# Patient Record
Sex: Female | Born: 1960 | Race: White | Hispanic: No | Marital: Married | Smoking: Former smoker
Health system: Southern US, Community
[De-identification: ages and names within clinical notes are randomized; demographics above are authoritative.]

## PROBLEM LIST (undated history)

## (undated) DIAGNOSIS — E119 Type 2 diabetes mellitus without complications: Secondary | ICD-10-CM

## (undated) DIAGNOSIS — Z950 Presence of cardiac pacemaker: Secondary | ICD-10-CM

## (undated) DIAGNOSIS — M797 Fibromyalgia: Secondary | ICD-10-CM

## (undated) DIAGNOSIS — M545 Low back pain, unspecified: Secondary | ICD-10-CM

## (undated) DIAGNOSIS — Z8489 Family history of other specified conditions: Secondary | ICD-10-CM

## (undated) DIAGNOSIS — F32A Depression, unspecified: Secondary | ICD-10-CM

## (undated) DIAGNOSIS — G8929 Other chronic pain: Secondary | ICD-10-CM

## (undated) DIAGNOSIS — I639 Cerebral infarction, unspecified: Secondary | ICD-10-CM

## (undated) DIAGNOSIS — I10 Essential (primary) hypertension: Secondary | ICD-10-CM

## (undated) DIAGNOSIS — E7800 Pure hypercholesterolemia, unspecified: Secondary | ICD-10-CM

## (undated) DIAGNOSIS — R569 Unspecified convulsions: Secondary | ICD-10-CM

## (undated) DIAGNOSIS — J45909 Unspecified asthma, uncomplicated: Secondary | ICD-10-CM

## (undated) DIAGNOSIS — F329 Major depressive disorder, single episode, unspecified: Secondary | ICD-10-CM

## (undated) DIAGNOSIS — H269 Unspecified cataract: Secondary | ICD-10-CM

## (undated) HISTORY — PX: TONSILLECTOMY: SUR1361

## (undated) HISTORY — DX: Depression, unspecified: F32.A

## (undated) HISTORY — DX: Pure hypercholesterolemia, unspecified: E78.00

## (undated) HISTORY — DX: Major depressive disorder, single episode, unspecified: F32.9

## (undated) HISTORY — PX: NECK SURGERY: SHX720

## (undated) HISTORY — DX: Type 2 diabetes mellitus without complications: E11.9

## (undated) HISTORY — DX: Unspecified cataract: H26.9

## (undated) HISTORY — PX: EYE SURGERY: SHX253

## (undated) HISTORY — PX: CHOLECYSTECTOMY: SHX55

## (undated) HISTORY — DX: Essential (primary) hypertension: I10

## (undated) HISTORY — DX: Fibromyalgia: M79.7

## (undated) HISTORY — PX: BACK SURGERY: SHX140

## (undated) HISTORY — DX: Unspecified convulsions: R56.9

---

## 1998-06-13 ENCOUNTER — Other Ambulatory Visit: Admission: RE | Admit: 1998-06-13 | Discharge: 1998-06-13 | Payer: Self-pay | Admitting: Obstetrics and Gynecology

## 1999-06-20 ENCOUNTER — Other Ambulatory Visit: Admission: RE | Admit: 1999-06-20 | Discharge: 1999-06-20 | Payer: Self-pay | Admitting: Obstetrics and Gynecology

## 1999-07-11 ENCOUNTER — Encounter: Admission: RE | Admit: 1999-07-11 | Discharge: 1999-07-11 | Payer: Self-pay | Admitting: Obstetrics and Gynecology

## 1999-07-11 ENCOUNTER — Encounter: Payer: Self-pay | Admitting: Obstetrics and Gynecology

## 2001-04-01 ENCOUNTER — Other Ambulatory Visit: Admission: RE | Admit: 2001-04-01 | Discharge: 2001-04-01 | Payer: Self-pay | Admitting: Obstetrics and Gynecology

## 2002-02-17 ENCOUNTER — Encounter: Payer: Self-pay | Admitting: Pulmonary Disease

## 2002-02-17 ENCOUNTER — Ambulatory Visit (HOSPITAL_COMMUNITY): Admission: RE | Admit: 2002-02-17 | Discharge: 2002-02-17 | Payer: Self-pay | Admitting: Pulmonary Disease

## 2002-06-28 ENCOUNTER — Other Ambulatory Visit: Admission: RE | Admit: 2002-06-28 | Discharge: 2002-06-28 | Payer: Self-pay | Admitting: Obstetrics and Gynecology

## 2002-11-16 ENCOUNTER — Encounter: Admission: RE | Admit: 2002-11-16 | Discharge: 2003-02-14 | Payer: Self-pay | Admitting: Pulmonary Disease

## 2004-02-24 ENCOUNTER — Inpatient Hospital Stay (HOSPITAL_COMMUNITY): Admission: EM | Admit: 2004-02-24 | Discharge: 2004-02-25 | Payer: Self-pay | Admitting: Emergency Medicine

## 2004-11-07 ENCOUNTER — Ambulatory Visit: Payer: Self-pay | Admitting: Pulmonary Disease

## 2004-11-07 ENCOUNTER — Other Ambulatory Visit: Admission: RE | Admit: 2004-11-07 | Discharge: 2004-11-07 | Payer: Self-pay | Admitting: Obstetrics and Gynecology

## 2004-11-13 ENCOUNTER — Ambulatory Visit: Payer: Self-pay | Admitting: Pulmonary Disease

## 2004-11-20 ENCOUNTER — Ambulatory Visit: Payer: Self-pay | Admitting: Pulmonary Disease

## 2005-03-07 ENCOUNTER — Ambulatory Visit: Payer: Self-pay | Admitting: Pulmonary Disease

## 2005-07-04 ENCOUNTER — Ambulatory Visit: Payer: Self-pay | Admitting: Pulmonary Disease

## 2005-10-29 ENCOUNTER — Ambulatory Visit: Payer: Self-pay | Admitting: Psychology

## 2005-11-06 ENCOUNTER — Ambulatory Visit: Payer: Self-pay | Admitting: Psychology

## 2005-12-02 ENCOUNTER — Other Ambulatory Visit: Admission: RE | Admit: 2005-12-02 | Discharge: 2005-12-02 | Payer: Self-pay | Admitting: Obstetrics and Gynecology

## 2005-12-07 ENCOUNTER — Ambulatory Visit: Payer: Self-pay | Admitting: Psychology

## 2005-12-18 ENCOUNTER — Ambulatory Visit: Payer: Self-pay | Admitting: Psychology

## 2005-12-31 ENCOUNTER — Ambulatory Visit: Payer: Self-pay | Admitting: Psychology

## 2006-01-02 ENCOUNTER — Ambulatory Visit: Payer: Self-pay | Admitting: Pulmonary Disease

## 2006-01-15 ENCOUNTER — Ambulatory Visit: Payer: Self-pay | Admitting: Psychology

## 2006-04-28 ENCOUNTER — Ambulatory Visit: Payer: Self-pay | Admitting: Pulmonary Disease

## 2006-07-28 ENCOUNTER — Ambulatory Visit: Payer: Self-pay | Admitting: Pulmonary Disease

## 2011-10-13 ENCOUNTER — Ambulatory Visit (INDEPENDENT_AMBULATORY_CARE_PROVIDER_SITE_OTHER): Payer: 59 | Admitting: Family Medicine

## 2011-10-13 DIAGNOSIS — F329 Major depressive disorder, single episode, unspecified: Secondary | ICD-10-CM | POA: Insufficient documentation

## 2011-10-13 DIAGNOSIS — F32A Depression, unspecified: Secondary | ICD-10-CM | POA: Insufficient documentation

## 2011-10-13 DIAGNOSIS — K219 Gastro-esophageal reflux disease without esophagitis: Secondary | ICD-10-CM | POA: Insufficient documentation

## 2011-10-13 DIAGNOSIS — E119 Type 2 diabetes mellitus without complications: Secondary | ICD-10-CM

## 2011-10-13 DIAGNOSIS — R259 Unspecified abnormal involuntary movements: Secondary | ICD-10-CM

## 2011-10-13 DIAGNOSIS — R251 Tremor, unspecified: Secondary | ICD-10-CM

## 2011-10-13 DIAGNOSIS — R635 Abnormal weight gain: Secondary | ICD-10-CM

## 2011-10-13 DIAGNOSIS — L853 Xerosis cutis: Secondary | ICD-10-CM

## 2011-10-13 DIAGNOSIS — R6889 Other general symptoms and signs: Secondary | ICD-10-CM

## 2011-10-13 LAB — COMPREHENSIVE METABOLIC PANEL
ALT: 14 U/L (ref 0–35)
AST: 13 U/L (ref 0–37)
Albumin: 4.8 g/dL (ref 3.5–5.2)
Calcium: 9.7 mg/dL (ref 8.4–10.5)
Chloride: 107 mEq/L (ref 96–112)
Potassium: 4 mEq/L (ref 3.5–5.3)

## 2011-10-13 LAB — LIPID PANEL
LDL Cholesterol: 70 mg/dL (ref 0–99)
VLDL: 31 mg/dL (ref 0–40)

## 2011-10-13 MED ORDER — ATORVASTATIN CALCIUM 20 MG PO TABS: 20.0000 mg | ORAL_TABLET | Freq: Every day | ORAL | Status: DC

## 2011-10-13 MED ORDER — LOSARTAN POTASSIUM 50 MG PO TABS: 50.0000 mg | ORAL_TABLET | Freq: Every day | ORAL | Status: DC

## 2011-10-13 MED ORDER — PRIMIDONE 250 MG PO TABS: 250.0000 mg | ORAL_TABLET | Freq: Every day | ORAL | Status: DC

## 2011-10-13 MED ORDER — METFORMIN HCL 500 MG PO TABS: ORAL_TABLET | ORAL | Status: DC

## 2011-10-13 NOTE — Progress Notes (Signed)
Subjective:    Patient ID: Brittany [name], female    DOB: 1961/05/22, 51 y.o.   MRN: [id number]  HPI Brittany [name] is a 51 y.o. female  Dm2: takning 1/2 metformin qd.  No home cbg's  No missed doses.  Tolerating meds.  Tremor: Since childhood.  No recent changes - notes tremor if stressed, o/w controlled on meds.  Would like to have rx here instead of Dr. Glade Stanford office.  Has ov scheduled at Dr . Vela Prose' s office in 9 days.  Cold natured x 1 week, and wt gain since last year.  No recent tsh.  No recent physical.        Review of Systems  Constitutional: Positive for chills and unexpected weight change. Negative for fever and appetite change.       Cold natured past week.  Worse in legs.  Weight gain since last summer 146-165  HENT: Negative for neck pain and neck stiffness.   Eyes: Negative for visual disturbance.  Respiratory: Negative for cough and shortness of breath.   Cardiovascular: Negative for chest pain and leg swelling.  Gastrointestinal: Negative for abdominal pain and diarrhea.       Rare diarrhea - s/p cholecystectomy.  Not regular occurrence.  Musculoskeletal: Negative for myalgias and arthralgias.  Skin: Negative for rash.       More dry skin, dry hair past 2 months.  Neurological: Positive for tremors. Negative for dizziness, weakness, light-headedness and headaches.       Tremor controlled on meds - sometimes notices with stress.  Chronic tremor.  Psychiatric/Behavioral: Negative for dysphoric mood.       Objective:   Physical Exam  Constitutional: She is oriented to person, place, and time. She appears well-developed and well-nourished. No distress.  HENT:  Head: Normocephalic and atraumatic.  Right Ear: External ear normal.  Left Ear: External ear normal.  Mouth/Throat: Oropharynx is clear and moist. No oropharyngeal exudate.  Eyes: Conjunctivae and EOM are normal. Pupils are equal, round, and reactive to light.  Neck: Normal range of motion. Neck  supple. No tracheal deviation present. No mass and no thyromegaly present.    Cardiovascular: Normal rate, regular rhythm, normal heart sounds, intact distal pulses and normal pulses.   No extrasystoles are present. PMI is not displaced.   No murmur heard. Pulses:      Dorsalis pedis pulses are 2+ on the right side, and 2+ on the left side.       Toes warm, nl cap refill.  Pulmonary/Chest: Effort normal and breath sounds normal.  Abdominal: Soft. Bowel sounds are normal. She exhibits no distension. There is no rebound.  Musculoskeletal: Normal range of motion. She exhibits no edema.  Neurological: She is alert and oriented to person, place, and time. She has normal strength. No cranial nerve deficit or sensory deficit. She displays a negative Romberg sign. Coordination normal.       Faint tremor at rest.  negative pronator drift.  Skin: Skin is warm and dry. She is not diaphoretic.  Psychiatric: She has a normal mood and affect. Her behavior is normal.    Results for orders placed in visit on 10/13/11  GLUCOSE, POCT (MANUAL RESULT ENTRY)      Component Value Range   POC Glucose 125    POCT GLYCOSYLATED HEMOGLOBIN (HGB A1C)      Component Value Range   Hemoglobin A1C 6.6           Assessment & Plan:  Brittany Ryant  [name] is a 50 y.o. female 1. DM (diabetes mellitus)  Lipid panel, Comprehensive metabolic panel, POCT Glucose (CBG), POCT HgB A1C  2. Tremor    3. Cold feeling  TSH  4. Dry skin  TSH  5. Weight gain  TSH   Cont same meds- dm controlled. Plan on refilling primidone here for next few months, but pt to schedule CPE with Dr. Alwyn Ren, and can discuss then if we are able to follow here for this med.  Should keep a follow up appt with Dr. Vela Prose in the meantime.  Marland Kitchen

## 2011-10-13 NOTE — Patient Instructions (Signed)
Continue same meds.  Start baby aspirin (81mg ) each day for diabetes.. Schedule physical for follow up with Dr. Alwyn Ren, and keep a follow up appointment with Dr. Vela Prose.

## 2012-05-01 ENCOUNTER — Other Ambulatory Visit: Payer: Self-pay | Admitting: Family Medicine

## 2012-05-10 ENCOUNTER — Ambulatory Visit (INDEPENDENT_AMBULATORY_CARE_PROVIDER_SITE_OTHER): Payer: 59 | Admitting: Emergency Medicine

## 2012-05-10 VITALS — BP 124/80 | HR 82 | Temp 98.6°F | Resp 16 | Ht 64.0 in | Wt 180.0 lb

## 2012-05-10 DIAGNOSIS — E785 Hyperlipidemia, unspecified: Secondary | ICD-10-CM

## 2012-05-10 DIAGNOSIS — E119 Type 2 diabetes mellitus without complications: Secondary | ICD-10-CM

## 2012-05-10 DIAGNOSIS — I10 Essential (primary) hypertension: Secondary | ICD-10-CM

## 2012-05-10 LAB — COMPREHENSIVE METABOLIC PANEL
ALT: 18 U/L (ref 0–35)
AST: 15 U/L (ref 0–37)
Albumin: 4.3 g/dL (ref 3.5–5.2)
CO2: 25 mEq/L (ref 19–32)
Calcium: 9.1 mg/dL (ref 8.4–10.5)
Chloride: 105 mEq/L (ref 96–112)
Potassium: 4 mEq/L (ref 3.5–5.3)

## 2012-05-10 LAB — POCT CBC
MCH, POC: 29.7 pg (ref 27–31.2)
MID (cbc): 0.4 (ref 0–0.9)
MPV: 8.4 fL (ref 0–99.8)
POC MID %: 7 %M (ref 0–12)
Platelet Count, POC: 277 10*3/uL (ref 142–424)
RBC: 4.98 M/uL (ref 4.04–5.48)
RDW, POC: 12.6 %
WBC: 6.3 10*3/uL (ref 4.6–10.2)

## 2012-05-10 LAB — POCT GLYCOSYLATED HEMOGLOBIN (HGB A1C): Hemoglobin A1C: 9.7

## 2012-05-10 LAB — LIPID PANEL: Cholesterol: 153 mg/dL (ref 0–200)

## 2012-05-10 LAB — GLUCOSE, POCT (MANUAL RESULT ENTRY): POC Glucose: 254 mg/dl — AB (ref 70–99)

## 2012-05-10 LAB — MICROALBUMIN, URINE: Microalb, Ur: 1.49 mg/dL (ref 0.00–1.89)

## 2012-05-10 MED ORDER — SERTRALINE HCL 100 MG PO TABS: 100.0000 mg | ORAL_TABLET | Freq: Every day | ORAL | Status: DC

## 2012-05-10 MED ORDER — METFORMIN HCL 500 MG PO TABS: 500.0000 mg | ORAL_TABLET | Freq: Two times a day (BID) | ORAL | Status: DC

## 2012-05-10 MED ORDER — LOSARTAN POTASSIUM 50 MG PO TABS: 50.0000 mg | ORAL_TABLET | Freq: Every day | ORAL | Status: DC

## 2012-05-10 MED ORDER — ATORVASTATIN CALCIUM 20 MG PO TABS: 20.0000 mg | ORAL_TABLET | Freq: Every day | ORAL | Status: DC

## 2012-05-10 NOTE — Progress Notes (Signed)
  Subjective:    Patient ID: Brittany [name], female    DOB: [birth year], 51 y.o.   MRN: [id number]  HPI patient here to get her blood work so she her medications refilled. History of metabolic syndrome with high blood pressure high cholesterol diabetes and obesity. She also has a history of  essential tremor and she is on primidone for that. She states she feels well. She continues to smoke. She is not had any chest pain shortness of breath bowel problems .    Review of Systems     Objective:   Physical Exam  Constitutional: She appears well-developed and well-nourished.  HENT:  Head: Normocephalic.  Eyes: Pupils are equal, round, and reactive to light.  Neck: Normal range of motion. No thyromegaly present.  Cardiovascular: Normal rate and regular rhythm.   Pulmonary/Chest: Effort normal and breath sounds normal.  Abdominal: Soft. She exhibits no distension. There is no tenderness.  Neurological:       She does have a fine tremor. Sensation in feet is normal. Dorsalis pedis posterior tibial pulses are normal    Results for orders placed in visit on 05/10/12  POCT CBC      Component Value Range   WBC 6.3  4.6 - 10.2 K/uL   Lymph, poc 2.0  0.6 - 3.4   POC LYMPH PERCENT 32.4  10 - 50 %L   MID (cbc) 0.4  0 - 0.9   POC MID % 7.0  0 - 12 %M   POC Granulocyte 3.8  2 - 6.9   Granulocyte percent 60.6  37 - 80 %G   RBC 4.98  4.04 - 5.48 M/uL   Hemoglobin 14.8  12.2 - 16.2 g/dL   HCT, POC 40.9  81.1 - 47.9 %   MCV 95.0  80 - 97 fL   MCH, POC 29.7  27 - 31.2 pg   MCHC 31.3 (*) 31.8 - 35.4 g/dL   RDW, POC 91.4     Platelet Count, POC 277  142 - 424 K/uL   MPV 8.4  0 - 99.8 fL  GLUCOSE, POCT (MANUAL RESULT ENTRY)      Component Value Range   POC Glucose 254 (*) 70 - 99 mg/dl  POCT GLYCOSYLATED HEMOGLOBIN (HGB A1C)      Component Value Range   Hemoglobin A1C 9.7          Assessment & Plan:  His hemoglobin A1c is up 3 points. I reinforced her need to continue to diet exercise  work on weight loss. She was again encouraged to stop smoking. I told her she was a high risk for heart disease and it was imperative that she address these issues

## 2012-10-26 ENCOUNTER — Ambulatory Visit (INDEPENDENT_AMBULATORY_CARE_PROVIDER_SITE_OTHER): Payer: 59 | Admitting: Family Medicine

## 2012-10-26 VITALS — BP 122/80 | HR 86 | Temp 98.8°F | Resp 16 | Ht 63.0 in | Wt 172.0 lb

## 2012-10-26 DIAGNOSIS — IMO0001 Reserved for inherently not codable concepts without codable children: Secondary | ICD-10-CM

## 2012-10-26 DIAGNOSIS — E785 Hyperlipidemia, unspecified: Secondary | ICD-10-CM | POA: Insufficient documentation

## 2012-10-26 DIAGNOSIS — E119 Type 2 diabetes mellitus without complications: Secondary | ICD-10-CM

## 2012-10-26 DIAGNOSIS — Z79899 Other long term (current) drug therapy: Secondary | ICD-10-CM | POA: Insufficient documentation

## 2012-10-26 DIAGNOSIS — I10 Essential (primary) hypertension: Secondary | ICD-10-CM | POA: Insufficient documentation

## 2012-10-26 DIAGNOSIS — R251 Tremor, unspecified: Secondary | ICD-10-CM

## 2012-10-26 LAB — COMPREHENSIVE METABOLIC PANEL
ALT: 28 U/L (ref 0–35)
CO2: 27 mEq/L (ref 19–32)
Calcium: 9.8 mg/dL (ref 8.4–10.5)
Chloride: 107 mEq/L (ref 96–112)
Creat: 0.58 mg/dL (ref 0.50–1.10)
Glucose, Bld: 186 mg/dL — ABNORMAL HIGH (ref 70–99)
Total Protein: 7.1 g/dL (ref 6.0–8.3)

## 2012-10-26 LAB — POCT GLYCOSYLATED HEMOGLOBIN (HGB A1C): Hemoglobin A1C: 9.4

## 2012-10-26 MED ORDER — METFORMIN HCL 1000 MG PO TABS: 1000.0000 mg | ORAL_TABLET | Freq: Two times a day (BID) | ORAL | Status: DC

## 2012-10-26 NOTE — Progress Notes (Signed)
Subjective:    Patient ID: Brittany [name], female    DOB: Aug 01, 1961, 52 y.o.   MRN: [id number]  HPI  At last visit, 5 mos ago her hgba1c had increased 3 points to 9.7.  Not checking sugars at home as when she previously did no one would ever review them.  Not made any changes as she likes starches/carbs to much and it is to expensive to eat healthy. Taking metformin 1 tab at night only before bed.  Gets jerking and presyncope when cbgs to low, gets sleepy when to high - she walks occ for exercise if cbgs are to high.  Doesn't have either of these episodes recently though did have lows previously when she tried to take metformin bid (years ago.)   Goes to gyn - Dr. Lyman Bishop - yrly for mammogram and pelvic.   Past Medical History  Diagnosis Date  . Diabetes mellitus without complication    Current Outpatient Prescriptions on File Prior to Visit  Medication Sig Dispense Refill  . atorvastatin (LIPITOR) 20 MG tablet Take 1 tablet (20 mg total) by mouth daily.  30 tablet  11  . losartan (COZAAR) 50 MG tablet Take 1 tablet (50 mg total) by mouth daily. NEEDS OFFICE VISIT/LABS  30 tablet  11  . metFORMIN (GLUCOPHAGE) 500 MG tablet Take 1 tablet (500 mg total) by mouth 2 (two) times daily with a meal. 1/2 tablet by mouth qd  60 tablet  11  . primidone (MYSOLINE) 250 MG tablet Take 1 tablet (250 mg total) by mouth at bedtime.  90 tablet  1  . sertraline (ZOLOFT) 100 MG tablet Take 1 tablet (100 mg total) by mouth daily.  30 tablet  11   No current facility-administered medications on file prior to visit.   No Known Allergies   Review of Systems  Constitutional: Positive for fatigue. Negative for fever, chills, diaphoresis and appetite change.  Eyes: Negative for visual disturbance.  Respiratory: Negative for cough and shortness of breath.   Cardiovascular: Negative for chest pain, palpitations and leg swelling.  Genitourinary: Negative for decreased urine volume.  Neurological: Positive for  tremors. Negative for syncope and headaches.  Hematological: Does not bruise/bleed easily.      BP 122/80  Pulse 86  Temp(Src) 98.8 F (37.1 C) (Oral)  Resp 16  Ht 5\' 3"  (1.6 m)  Wt 172 lb (78.019 kg)  BMI 30.48 kg/m2  SpO2 98% Objective:   Physical Exam  Constitutional: She is oriented to person, place, and time. She appears well-developed and well-nourished. No distress.  HENT:  Head: Normocephalic and atraumatic.  Right Ear: External ear normal.  Left Ear: External ear normal.  Eyes: Conjunctivae are normal. No scleral icterus.  Neck: Normal range of motion. Neck supple. No thyromegaly present.  Cardiovascular: Normal rate, regular rhythm, normal heart sounds and intact distal pulses.   Pulmonary/Chest: Effort normal and breath sounds normal. No respiratory distress.  Musculoskeletal: She exhibits no edema.  Lymphadenopathy:    She has no cervical adenopathy.  Neurological: She is alert and oriented to person, place, and time.  Skin: Skin is warm and dry. She is not diaphoretic. No erythema.  Psychiatric: Her speech is normal and behavior is normal. Her affect is blunt. She exhibits a depressed mood.          Results for orders placed in visit on 10/26/12  POCT GLYCOSYLATED HEMOGLOBIN (HGB A1C)      Result Value Range   Hemoglobin A1C 9.4  Assessment & Plan:  DM (diabetes mellitus) - Plan: POCT glycosylated hemoglobin (Hb A1C), Comprehensive metabolic panel, metFORMIN (GLUCOPHAGE) 1000 MG tablet, DISCONTINUED: metFORMIN (GLUCOPHAGE) 1000 MG tablet.  Since the past yr pt's hgba1c has increased 3 pts from 6's to 9's.  Has not improved with trial of tlc in past 5 mos so increase metformin from 500 qd to 1000 bid.  Start asa 81 qd.  Optho eval?? Discuss at f/u.  Cont arb - unsure if pt has had a trial of acei - might need to review chart to complete PA if insurance needs.  Pt does not have meter at home - needs new rx for meter, strips, lancets though I forgot to give  this to her in the office.  Tremor - stable essential tremor, cont primidone  Encounter for long-term (current) use of other medications  Hyperlipidemia LDL goal < 100 - check flp at f/u  Essential hypertension, benign  Meds ordered this encounter  Medications                 . metFORMIN (GLUCOPHAGE) 1000 MG tablet    Sig: Take 1 tablet (1,000 mg total) by mouth 2 (two) times daily with a meal.    Dispense:  180 tablet    Refill:  1

## 2012-10-26 NOTE — Patient Instructions (Signed)
Diabetes, Eating Away From Home Sometimes, you might eat in a restaurant or have meals that are prepared by someone else. You can enjoy eating out. However, the portions in restaurants may be much larger than needed. Listed below are some ideas to help you choose foods that will keep your blood glucose (sugar) in better control.  TIPS FOR EATING OUT  Know your meal plan and how many carbohydrate servings you should have at each meal. You may wish to carry a copy of your meal plan in your purse or wallet. Learn the foods included in each food group.  Make a list of restaurants near you that offer healthy choices. Take a copy of the carry-out menus to see what they offer. Then, you can plan what you will order ahead of time.  Become familiar with serving sizes by practicing them at home using measuring cups and spoons. Once you learn to recognize portion sizes, you will be able to correctly estimate the amount of total carbohydrate you are allowed to eat at the restaurant. Ask for a takeout box if the portion is more than you should have. When your food comes, leave the amount you should have on the plate, and put the rest in the takeout box before you start eating.  Plan ahead if your mealtime will be different from usual. Check with your caregiver to find out how to time meals and medicine if you are taking insulin.  Avoid high-fat foods, such as fried foods, cream sauces, high-fat salad dressings, or any added butter or margarine.  Do not be afraid to ask questions. Ask your server about the portion size, cooking methods, ingredients and if items can be substituted. Restaurants do not list all available items on the menu. You can ask for your main entree to be prepared using skim milk, oil instead of butter or margarine, and without gravy or sauces. Ask your waiter or waitress to serve salad dressings, gravy, sauces, margarine, and sour cream on the side. You can then add the amount your meal plan  suggests.  Add more vegetables whenever possible.  Avoid items that are labeled "jumbo," "giant," "deluxe," or "supersized."  You may want to split an entre with someone and order an extra side salad.  Watch for hidden calories in foods like croutons, bacon, or cheese.  Ask your server to take away the bread basket or chips from your table.  Order a dinner salad as an appetizer. You can eat most foods served in a restaurant. Some foods are better choices than others. Breads and Starches  Recommended: All kinds of bread (wheat, rye, white, oatmeal, [place], [place], raisin), hard or soft dinner rolls, frankfurter or hamburger buns, small bagels, small corn or whole-wheat flour tortillas.  Avoid: Frosted or glazed breads, butter rolls, egg or cheese breads, croissants, sweet rolls, pastries, coffee cake, glazed or frosted doughnuts, muffins. Crackers  Recommended: Animal crackers, graham, rye, saltine, oyster, and matzoth crackers. Bread sticks, melba toast, rusks, pretzels, popcorn (without fat), zwieback toast.  Avoid: High-fat snack crackers or chips. Buttered popcorn. Cereals  Recommended: Hot and cold cereals. Whole grains such as oatmeal or shredded wheat are good choices.  Avoid: Sugar-coated or granola type cereals. Potatoes/Pasta/Rice/Beans  Recommended: Order baked, boiled, or mashed potatoes, rice or noodles without added fat, whole beans. Order gravies, butter, margarine, or sauces on the side so you can control the amount you add.  Avoid: Hash browns or fried potatoes. Potatoes, pasta, or rice prepared with cream  or cheese sauce. Potato or pasta salads prepared with large amounts of dressing. Fried beans or fried rice. Vegetables  Recommended: Order steamed, baked, boiled, or stewed vegetables without sauces or extra fat. Ask that sauce be served on the side. If vegetables are not listed on the menu, ask what is available.  Avoid: Vegetables prepared with cream,  butter, or cheese sauce. Fried vegetables. Salad Bars  Recommended: Many of the vegetables at a salad bar are considered "free." Use lemon juice, vinegar, or low-calorie salad dressing (fewer than 20 calories per serving) as "free" dressings for your salad. Look for salad bar ingredients that have no added fat or sugar such as tomatoes, lettuce, cucumbers, broccoli, carrots, onions, and mushrooms.  Avoid: Prepared salads with large amounts of dressing, such as coleslaw, caesar salad, macaroni salad, bean salad, or carrot salad. Fruit  Recommended: Eat fresh fruit or fresh fruit salad without added dressing. A salad bar often offers fresh fruit choices, but canned fruit at a restaurant is usually packed in sugar or syrup.  Avoid: Sweetened canned or frozen fruits, plain or sweetened fruit juice. Fruit salads with dressing, sour cream, or sugar added to them. Meat and Meat Substitutes  Recommended: Order broiled, baked, roasted, or grilled meat, poultry, or fish. Trim off all visible fat. Do not eat the skin of poultry. The size stated on the menu is the raw weight. Meat shrinks by  in cooking (for example, 4 oz raw equals 3 oz cooked meat).  Avoid: Deep-fat fried meat, poultry, or fish. Breaded meats. Eggs  Recommended: Order soft, hard-cooked, poached, or scrambled eggs. Omelets may be okay, depending on what ingredients are added. Egg substitutes are also a good choice.  Avoid: Fried eggs, eggs prepared with cream or cheese sauce. Milk  Recommended: Order low-fat or fat-free milk according to your meal plan. Plain, nonfat yogurt or flavored yogurt with no sugar added may be used as a substitute for milk. Soy milk may also be used.  Avoid: Milk shakes or sweetened milk beverages. Soups and Combination Foods  Recommended: Clear broth or consomm are "free" foods and may be used as an appetizer. Broth-based soups with fat removed count as a starch serving and are preferred over cream  soups. Soups made with beans or split peas may be eaten but count as a starch.  Avoid: Fatty soups, soup made with cream, cheese soup. Combination foods prepared with excessive amounts of fat or with cream or cheese sauces. Desserts and Sweets  Recommended: Ask for fresh fruit. Sponge or angel food cake without icing, ice milk, no sugar added ice cream, sherbet, or frozen yogurt may fit into your meal plan occasionally.  Avoid: Pastries, puddings, pies, cakes with icing, custard, gelatin desserts. Fats and Oils  Recommended: Choose healthy fats such as olive oil, canola oil, or tub margarine, reduced fat or fat-free sour cream, cream cheese, avocado, or nuts.  Avoid: Any fats in excess of your allowed portion. Deep-fried foods or any food with a large amount of fat. Note: Ask for all fats to be served on the side, and limit your portion sizes according to your meal plan. Document Released: 08/26/2005 Document Revised: 11/18/2011 Document Reviewed: 03/16/2009 [facility] Patient Information 2013 [place], [place]. Diets for Diabetes, Food Labeling Look at food labels to help you decide how much of a product you can eat. You will want to check the amount of total carbohydrate in a serving to see how the food fits into your meal plan. In the  list of ingredients, the ingredient present in the largest amount by weight must be listed first, followed by the other ingredients in descending order. STANDARD OF IDENTITY Most products have a list of ingredients. However, foods that the Food and Drug Administration (FDA) has given a standard of identity do not need a list of ingredients. A standard of identity means that a food must contain certain ingredients if it is called a particular name. Examples are mayonnaise, peanut butter, ketchup, jelly, and cheese. LABELING TERMS There are many terms found on food labels. Some of these terms have specific definitions. Some terms are regulated by the FDA, and the FDA  has clearly specified how they can be used. Others are not regulated or well-defined and can be misleading and confusing. SPECIFICALLY DEFINED TERMS Nutritive Sweetener.  A sweetener that contains calories,such as table sugar or honey. Nonnutritive Sweetener.  A sweetener with few or no calories,such as saccharin, aspartame, sucralose, and cyclamate. LABELING TERMS REGULATED BY THE FDA Free.  The product contains only a tiny or small amount of fat, cholesterol, sodium, sugar, or calories. For example, a "fat-free" product will contain less than 0.5 g of fat per serving. Low.  A food described as "low" in fat, saturated fat, cholesterol, sodium, or calories could be eaten fairly often without exceeding dietary guidelines. For example, "low in fat" means no more than 3 g of fat per serving. Lean.  "Lean" and "extra lean" are U.S. Department of Agriculture Architect) terms for use on meat and poultry products. "Lean" means the product contains less than 10 g of fat, 4 g of saturated fat, and 95 mg of cholesterol per serving. "Lean" is not as low in fat as a product labeled "low." Extra Lean.  "Extra lean" means the product contains less than 5 g of fat, 2 g of saturated fat, and 95 mg of cholesterol per serving. While "extra lean" has less fat than "lean," it is still higher in fat than a product labeled "low." Reduced, Less, Fewer.  A diet product that contains 25% less of a nutrient or calories than the regular version. For example, hot dogs might be labeled "25% less fat than our regular hot dogs." Light/Lite.  A diet product that contains  fewer calories or  the fat of the original. For example, "light in sodium" means a product with  the usual sodium. More.  One serving contains at least 10% more of the daily value of a vitamin, mineral, or fiber than usual. Good Source Of.  One serving contains 10% to 19% of the daily value for a particular vitamin, mineral, or fiber. Excellent  Source Of.  One serving contains 20% or more of the daily value for a particular nutrient. Other terms used might be "high in" or "rich in." Enriched or Fortified.  The product contains added vitamins, minerals, or protein. Nutrition labeling must be used on enriched or fortified foods. Imitation.  The product has been altered so that it is lower in protein, vitamins, or minerals than the usual food,such as imitation peanut butter. Total Fat.  The number listed is the total of all fat found in a serving of the product. Under total fat, food labels must list saturated fat and trans fat, which are associated with raising bad cholesterol and an increased risk of heart blood vessel disease. Saturated Fat.  Mainly fats from animal-based sources. Some examples are red meat, cheese, cream, whole milk, and coconut oil. Trans Fat.  Found in some fried  snack foods, packaged foods, and fried restaurant foods. It is recommended you eat as close to 0 g of trans fat as possible, since it raises bad cholesterol and lowers good cholesterol. Polyunsaturated and Monounsaturated Fats.  More healthful fats. These fats are from plant sources. Total Carbohydrate.  The number of carbohydrate grams in a serving of the product. Under total carbohydrate are listed the other carbohydrate sources, such as dietary fiber and sugars. Dietary Fiber.  A carbohydrate from plant sources. Sugars.  Sugars listed on the label contain all naturally occurring sugars as well as added sugars. LABELING TERMS NOT REGULATED BY THE FDA Sugarless.  Table sugar (sucrose) has not been added. However, the manufacturer may use another form of sugar in place of sucrose to sweeten the product. For example, sugar alcohols are used to sweeten foods. Sugar alcohols are a form of sugar but are not table sugar. If a product contains sugar alcohols in place of sucrose, it can still be labeled "sugarless." Low Salt, Salt-Free, Unsalted, No  Salt, No Salt Added, Without Added Salt.  Food that is usually processed with salt has been made without salt. However, the food may contain sodium-containing additives, such as preservatives, leavening agents, or flavorings. Natural.  This term has no legal meaning. Organic.  Foods that are certified as organic have been inspected and approved by the USDA to ensure they are produced without pesticides, fertilizers containing synthetic ingredients, bioengineering, or ionizing radiation. Document Released: 08/29/2003 Document Revised: 11/18/2011 Document Reviewed: 03/16/2009 [facility] Patient Information 2013 [place], [place].   Diabetes Meal Planning Guide The diabetes meal planning guide is a tool to help you plan your meals and snacks. It is important for people with diabetes to manage their blood glucose (sugar) levels. Choosing the right foods and the right amounts throughout your day will help control your blood glucose. Eating right can even help you improve your blood pressure and reach or maintain a healthy weight. CARBOHYDRATE COUNTING MADE EASY When you eat carbohydrates, they turn to sugar. This raises your blood glucose level. Counting carbohydrates can help you control this level so you feel better. When you plan your meals by counting carbohydrates, you can have more flexibility in what you eat and balance your medicine with your food intake. Carbohydrate counting simply means adding up the total amount of carbohydrate grams in your meals and snacks. Try to eat about the same amount at each meal. Foods with carbohydrates are listed below. Each portion below is 1 carbohydrate serving or 15 grams of carbohydrates. Ask your dietician how many grams of carbohydrates you should eat at each meal or snack. Grains and Starches  1 slice bread.   English muffin or hotdog/hamburger bun.   cup cold cereal (unsweetened).   cup cooked pasta or rice.   cup starchy vegetables (corn,  potatoes, peas, beans, winter squash).  1 tortilla (6 inches).   bagel.  1 waffle or pancake (size of a CD).   cup cooked cereal.  4 to 6 small crackers. *Whole grain is recommended. Fruit  1 cup fresh unsweetened berries, melon, papaya, pineapple.  1 small fresh fruit.   banana or mango.   cup fruit juice (4 oz unsweetened).   cup canned fruit in natural juice or water.  2 tbs dried fruit.  12 to 15 grapes or cherries. Milk and Yogurt  1 cup fat-free or 1% milk.  1 cup soy milk.  6 oz light yogurt with sugar-free sweetener.  6 oz low-fat soy yogurt.  6 oz plain yogurt. Vegetables  1 cup raw or  cup cooked is counted as 0 carbohydrates or a "free" food.  If you eat 3 or more servings at 1 meal, count them as 1 carbohydrate serving. Other Carbohydrates   oz chips or pretzels.   cup ice cream or frozen yogurt.   cup sherbet or sorbet.  2 inch square cake, no frosting.  1 tbs honey, sugar, jam, jelly, or syrup.  2 small cookies.  3 squares of graham crackers.  3 cups popcorn.  6 crackers.  1 cup broth-based soup.  Count 1 cup casserole or other mixed foods as 2 carbohydrate servings.  Foods with less than 20 calories in a serving may be counted as 0 carbohydrates or a "free" food. You may want to purchase a book or computer software that lists the carbohydrate gram counts of different foods. In addition, the nutrition facts panel on the labels of the foods you eat are a good source of this information. The label will tell you how big the serving size is and the total number of carbohydrate grams you will be eating per serving. Divide this number by 15 to obtain the number of carbohydrate servings in a portion. Remember, 1 carbohydrate serving equals 15 grams of carbohydrate. SERVING SIZES Measuring foods and serving sizes helps you make sure you are getting the right amount of food. The list below tells how big or small some common serving  sizes are.  1 oz.........4 stacked dice.  3 oz........Marland KitchenDeck of cards.  1 tsp.......Marland KitchenTip of little finger.  1 tbs......Marland KitchenMarland KitchenThumb.  2 tbs.......Marland KitchenGolf ball.   cup......Marland KitchenHalf of a fist.  1 cup.......Marland KitchenA fist. SAMPLE DIABETES MEAL PLAN Below is a sample meal plan that includes foods from the grain and starches, dairy, vegetable, fruit, and meat groups. A dietician can individualize a meal plan to fit your calorie needs and tell you the number of servings needed from each food group. However, controlling the total amount of carbohydrates in your meal or snack is more important than making sure you include all of the food groups at every meal. You may interchange carbohydrate containing foods (dairy, starches, and fruits). The meal plan below is an example of a 2000 calorie diet using carbohydrate counting. This meal plan has 17 carbohydrate servings. Breakfast  1 cup oatmeal (2 carb servings).   cup light yogurt (1 carb serving).  1 cup blueberries (1 carb serving).   cup almonds. Snack  1 large apple (2 carb servings).  1 low-fat string cheese stick. Lunch  Chicken breast salad.  1 cup spinach.   cup chopped tomatoes.  2 oz chicken breast, sliced.  2 tbs low-fat [place] dressing.  12 whole-wheat crackers (2 carb servings).  12 to 15 grapes (1 carb serving).  1 cup low-fat milk (1 carb serving). Snack  1 cup carrots.   cup hummus (1 carb serving). Dinner  3 oz broiled salmon.  1 cup brown rice (3 carb servings). Snack  1  cups steamed broccoli (1 carb serving) drizzled with 1 tsp olive oil and lemon juice.  1 cup light pudding (2 carb servings). DIABETES MEAL PLANNING WORKSHEET Your dietician can use this worksheet to help you decide how many servings of foods and what types of foods are right for you.  BREAKFAST Food Group and Servings / Carb Servings Grain/Starches __________________________________ Dairy  __________________________________________ Vegetable ______________________________________ Fruit ___________________________________________ Meat __________________________________________ Fat ____________________________________________ LUNCH Food Group and Servings / Carb Servings Grain/Starches ___________________________________ Dairy ___________________________________________ Zada Girt  ____________________________________________ Meat ___________________________________________ Fat _____________________________________________ Laural Golden Food Group and Servings / Carb Servings Grain/Starches ___________________________________ Dairy ___________________________________________ Fruit ____________________________________________ Meat ___________________________________________ Fat _____________________________________________ SNACKS Food Group and Servings / Carb Servings Grain/Starches ___________________________________ Dairy ___________________________________________ Vegetable _______________________________________ Fruit ____________________________________________ Meat ___________________________________________ Fat _____________________________________________ DAILY TOTALS Starches _________________________ Vegetable ________________________ Fruit ____________________________ Dairy ____________________________ Meat ____________________________ Fat ______________________________ Document Released: 05/23/2005 Document Revised: 11/18/2011 Document Reviewed: 04/03/2009 [employer] Patient Information 2013 [place], [place].

## 2012-10-27 ENCOUNTER — Telehealth: Payer: Self-pay

## 2012-10-27 NOTE — Telephone Encounter (Signed)
Dr Clelia Croft, Rx for Metformin has two different sigs, one for 1 tab BID and the other for 1/2 tab QD. Please clarify how you want pt to take the Metformin.

## 2012-10-27 NOTE — Telephone Encounter (Signed)
Pharmacy is calling has questions about the strength and dosage for the metformin  Best number [phone]

## 2012-10-28 MED ORDER — METFORMIN HCL 1000 MG PO TABS: 1000.0000 mg | ORAL_TABLET | Freq: Two times a day (BID) | ORAL | Status: DC

## 2012-10-28 NOTE — Telephone Encounter (Signed)
Pt is supposed to increase her metformin to 1000mg  bid - new rx sent to pharmacy.

## 2013-01-22 ENCOUNTER — Encounter: Payer: Self-pay | Admitting: Family Medicine

## 2013-01-22 ENCOUNTER — Ambulatory Visit (INDEPENDENT_AMBULATORY_CARE_PROVIDER_SITE_OTHER): Payer: 59 | Admitting: Family Medicine

## 2013-01-22 VITALS — BP 126/82 | HR 84 | Temp 98.9°F | Resp 16 | Ht 63.0 in | Wt 168.8 lb

## 2013-01-22 DIAGNOSIS — F17200 Nicotine dependence, unspecified, uncomplicated: Secondary | ICD-10-CM

## 2013-01-22 DIAGNOSIS — IMO0001 Reserved for inherently not codable concepts without codable children: Secondary | ICD-10-CM

## 2013-01-22 DIAGNOSIS — Z79899 Other long term (current) drug therapy: Secondary | ICD-10-CM

## 2013-01-22 LAB — CBC WITH DIFFERENTIAL/PLATELET
Basophils Absolute: 0 10*3/uL (ref 0.0–0.1)
Eosinophils Absolute: 0.2 10*3/uL (ref 0.0–0.7)
Eosinophils Relative: 3 % (ref 0–5)
MCH: 30.7 pg (ref 26.0–34.0)
MCV: 87.3 fL (ref 78.0–100.0)
Platelets: 276 10*3/uL (ref 150–400)
RDW: 13.2 % (ref 11.5–15.5)
WBC: 6.8 10*3/uL (ref 4.0–10.5)

## 2013-01-22 LAB — COMPREHENSIVE METABOLIC PANEL
Albumin: 4.5 g/dL (ref 3.5–5.2)
Alkaline Phosphatase: 115 U/L (ref 39–117)
Glucose, Bld: 203 mg/dL — ABNORMAL HIGH (ref 70–99)
Potassium: 4.5 mEq/L (ref 3.5–5.3)
Sodium: 140 mEq/L (ref 135–145)
Total Protein: 6.6 g/dL (ref 6.0–8.3)

## 2013-01-22 LAB — LIPID PANEL
Cholesterol: 123 mg/dL (ref 0–200)
HDL: 50 mg/dL (ref >39)

## 2013-01-22 LAB — POCT GLYCOSYLATED HEMOGLOBIN (HGB A1C): Hemoglobin A1C: 8.7

## 2013-01-22 MED ORDER — METFORMIN HCL ER (OSM) 1000 MG PO TB24: 2000.0000 mg | ORAL_TABLET | Freq: Every evening | ORAL | Status: DC

## 2013-01-22 NOTE — Progress Notes (Signed)
Subjective:    Patient ID: Brittany [name], female    DOB: 1961-07-22, 52 y.o.   MRN: [id number] Chief Complaint  Patient presents with  . Diabetes    follow up - 3 months    HPI  Brittany [name] is a 52 yo female here to f/u on her DM.  At last visit her hgba1c had increased >3 from 6 to over 9 so we increased her metformin from 500 qd to 1000bid.  She is not having any problems tolerating the metformin but often forgets to take the morning time dose.  Other than going to work most mornings, she does not have a regular routine. Sometimes she eats breakfast at home, sometimes she eats out, sometimes skips so forgetting morning dose 1/2 the time.  Has not eaten today. Not checking sugars at home as does not have a meter.  Occ feels sleepy w/ high sugars. Does not feel like she is having any lows. Had optho eval last yr and was normal. Taking baby asa daily.   She takes primidone regularly at night for her tremor which is well controlled. Knows if she misses this med she could have a seizure so VERY good about taking meds at night and not missing evening metformin.  So interested in swtiching to long acting metformin at night for compliance.    Mood good.  Zoloft working well. 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  Chasing 5 dogs but not getting any formal exercise.  Has quit smoking many times throughout her life - longest was for 20 yrs when she stopped cold [place] when she was pregnant.  Since then she stopped several other times - once chantix worked well for her but the second time she just stopped taking her chantix after the second week as she just wanted to keep smoking.  She most recently restarted smoking 6 yrs ago when she found her 21 yo son was smoking. This is her youngest son who had always encouraged her to quit before he started.   Grandmother had lung cancer dec at 110 yo and aunt had copd  Past Medical History  Diagnosis Date  . Diabetes mellitus  without complication    Current Outpatient Prescriptions on File Prior to Visit  Medication Sig Dispense Refill  . atorvastatin (LIPITOR) 20 MG tablet Take 1 tablet (20 mg total) by mouth daily.  30 tablet  11  . losartan (COZAAR) 50 MG tablet Take 1 tablet (50 mg total) by mouth daily. NEEDS OFFICE VISIT/LABS  30 tablet  11  . primidone (MYSOLINE) 250 MG tablet Take 1 tablet (250 mg total) by mouth at bedtime.  90 tablet  1  . sertraline (ZOLOFT) 100 MG tablet Take 1 tablet (100 mg total) by mouth daily.  30 tablet  11   No current facility-administered medications on file prior to visit.   No Known Allergies Past Surgical History  Procedure Laterality Date  . Cholecystectomy    . Spine surgery     Family History  Problem Relation Age of Onset  . Cancer Mother   . Diabetes Mother   . Heart disease Mother   . Diabetes Father   . Heart disease Father    History   Social History  . Marital Status: Single    Spouse Name: N/A    Number of Children: N/A  . Years of Education: N/A   Social History Main Topics  . Smoking status: Current  Every Day Smoker -- 1.00 packs/day for 2 years    Types: Cigarettes  . Smokeless tobacco: None  . Alcohol Use: No  . Drug Use: None  . Sexually Active: None   Other Topics Concern  . None   Social History Narrative  . None    Review of Systems  Constitutional: Positive for fatigue. Negative for fever, chills, diaphoresis and appetite change.  Eyes: Negative for visual disturbance.  Respiratory: Negative for cough, chest tightness and shortness of breath.   Cardiovascular: Negative for chest pain, palpitations and leg swelling.  Genitourinary: Negative for decreased urine volume.  Neurological: Negative for syncope and numbness.  Hematological: Does not bruise/bleed easily.  Psychiatric/Behavioral: Negative for dysphoric mood. The patient is not nervous/anxious.       BP 126/82  Pulse 84  Temp(Src) 98.9 F (37.2 C) (Oral)  Resp  16  Ht 5\' 3"  (1.6 m)  Wt 168 lb 12.8 oz (76.567 kg)  BMI 29.91 kg/m2  SpO2 95% Objective:   Physical Exam  Constitutional: She is oriented to person, place, and time. She appears well-developed and well-nourished. No distress.  HENT:  Head: Normocephalic and atraumatic.  Right Ear: External ear normal.  Left Ear: External ear normal.  Eyes: Conjunctivae are normal. No scleral icterus.  Neck: Normal range of motion. Neck supple. No thyromegaly present.  Cardiovascular: Normal rate, regular rhythm, normal heart sounds and intact distal pulses.   Pulmonary/Chest: Effort normal and breath sounds normal. No respiratory distress.  Musculoskeletal: She exhibits no edema.  Lymphadenopathy:    She has no cervical adenopathy.  Neurological: She is alert and oriented to person, place, and time.  Bilateral monofil exam nml  Skin: Skin is warm and dry. She is not diaphoretic. No erythema.  Psychiatric: She has a normal mood and affect. Her behavior is normal.      Results for orders placed in visit on 01/22/13  POCT GLYCOSYLATED HEMOGLOBIN (HGB A1C)      Result Value Range   Hemoglobin A1C 8.7      Assessment & Plan:  Type II or unspecified type diabetes mellitus without mention of complication, uncontrolled - Plan: POCT glycosylated hemoglobin (Hb A1C), Comprehensive metabolic panel - Z6X minimally improving but pt having trouble with compliance so max out metformin by switching to XR dosing and take 2000mg  at night.  If a1c not < 7 at f/u, will add in sulfonylurea. Cont asa, monofil neg, optho UTD.  Encounter for long-term (current) use of other medications - Plan: Lipid panel, TSH, CBC with Differential  Tobacco use- Encourage cessation.  Pt contemplative - when she is ready, recommend to retry chantix.  Pt will likely need a refill on her primidone right before her next OV so ok to refill x 3 mo when needed.  Meds ordered this encounter  Medications  . aspirin 81 MG tablet    Sig:  Take 81 mg by mouth daily.  . metformin (FORTAMET) 1000 MG (OSM) 24 hr tablet    Sig: Take 2 tablets (2,000 mg total) by mouth every evening.    Dispense:  180 tablet    Refill:  1

## 2013-01-22 NOTE — Patient Instructions (Addendum)
Diabetes and Exercise Regular exercise is important and can help:   Control blood glucose (sugar).  Decrease blood pressure.    Control blood lipids (cholesterol, triglycerides).  Improve overall health. BENEFITS FROM EXERCISE  Improved fitness.  Improved flexibility.  Improved endurance.  Increased bone density.  Weight control.  Increased muscle strength.  Decreased body fat.  Improvement of the body's use of insulin, a hormone.  Increased insulin sensitivity.  Reduction of insulin needs.  Reduced stress and tension.  Helps you feel better. People with diabetes who add exercise to their lifestyle gain additional benefits, including:  Weight loss.  Reduced appetite.  Improvement of the body's use of blood glucose.  Decreased risk factors for heart disease:  Lowering of cholesterol and triglycerides.  Raising the level of good cholesterol (high-density lipoproteins, HDL).  Lowering blood sugar.  Decreased blood pressure. TYPE 1 DIABETES AND EXERCISE  Exercise will usually lower your blood glucose.  If blood glucose is greater than 240 mg/dl, check urine ketones. If ketones are present, do not exercise.  Location of the insulin injection sites may need to be adjusted with exercise. Avoid injecting insulin into areas of the body that will be exercised. For example, avoid injecting insulin into:  The arms when playing tennis.  The legs when jogging. For more information, discuss this with your caregiver.  Keep a record of:  Food intake.  Type and amount of exercise.  Expected peak times of insulin action.  Blood glucose levels. Do this before, during, and after exercise. Review your records with your caregiver. This will help you to develop guidelines for adjusting food intake and insulin amounts.  TYPE 2 DIABETES AND EXERCISE  Regular physical activity can help control blood glucose.  Exercise is important because it may:  Increase the  body's sensitivity to insulin.  Improve blood glucose control.  Exercise reduces the risk of heart disease. It decreases serum cholesterol and triglycerides. It also lowers blood pressure.  Those who take insulin or oral hypoglycemic agents should watch for signs of hypoglycemia. These signs include dizziness, shaking, sweating, chills, and confusion.  Body water is lost during exercise. It must be replaced. This will help to avoid loss of body fluids (dehydration) or heat stroke. Be sure to talk to your caregiver before starting an exercise program to make sure it is safe for you. Remember, any activity is better than none.  Document Released: 11/16/2003 Document Revised: 11/18/2011 Document Reviewed: 03/02/2009 [facility] Patient Information 2013 [place], [place]. Smoking Cessation, Tips for Success YOU CAN QUIT SMOKING If you are ready to quit smoking, congratulations! You have chosen to help yourself be healthier. Cigarettes bring nicotine, tar, carbon monoxide, and other irritants into your body. Your lungs, heart, and blood vessels will be able to work better without these poisons. There are many different ways to quit smoking. Nicotine gum, nicotine patches, a nicotine inhaler, or nicotine nasal spray can help with physical craving. Hypnosis, support groups, and medicines help break the habit of smoking. Here are some tips to help you quit for good.  Throw away all cigarettes.  Clean and remove all ashtrays from your home, work, and car.  On a card, write down your reasons for quitting. Carry the card with you and read it when you get the urge to smoke.  Cleanse your body of nicotine. Drink enough water and fluids to keep your urine clear or pale yellow. Do this after quitting to flush the nicotine from your body.  Learn to predict  your moods. Do not let a bad situation be your excuse to have a cigarette. Some situations in your life might tempt you into wanting a cigarette.  Never have  "just one" cigarette. It leads to wanting another and another. Remind yourself of your decision to quit.  Change habits associated with smoking. If you smoked while driving or when feeling stressed, try other activities to replace smoking. Stand up when drinking your coffee. Brush your teeth after eating. Sit in a different chair when you read the paper. Avoid alcohol while trying to quit, and try to drink fewer caffeinated beverages. Alcohol and caffeine may urge you to smoke.  Avoid foods and drinks that can trigger a desire to smoke, such as sugary or spicy foods and alcohol.  Ask people who smoke not to smoke around you.  Have something planned to do right after eating or having a cup of coffee. Take a walk or exercise to perk you up. This will help to keep you from overeating.  Try a relaxation exercise to calm you down and decrease your stress. Remember, you may be tense and nervous for the first 2 weeks after you quit, but this will pass.  Find new activities to keep your hands busy. Play with a pen, coin, or rubber band. Doodle or draw things on paper.  Brush your teeth right after eating. This will help cut down on the craving for the taste of tobacco after meals. You can try mouthwash, too.  Use oral substitutes, such as lemon drops, carrots, a cinnamon stick, or chewing gum, in place of cigarettes. Keep them handy so they are available when you have the urge to smoke.  When you have the urge to smoke, try deep breathing.  Designate your home as a nonsmoking area.  If you are a heavy smoker, ask your caregiver about a prescription for nicotine chewing gum. It can ease your withdrawal from nicotine.  Reward yourself. Set aside the cigarette money you save and buy yourself something nice.  Look for support from others. Join a support group or smoking cessation program. Ask someone at home or at work to help you with your plan to quit smoking.  Always ask yourself, "Do I need this  cigarette or is this just a reflex?" Tell yourself, "Today, I choose not to smoke," or "I do not want to smoke." You are reminding yourself of your decision to quit, even if you do smoke a cigarette. HOW WILL I FEEL WHEN I QUIT SMOKING?  The benefits of not smoking start within days of quitting.  You may have symptoms of withdrawal because your body is used to nicotine (the addictive substance in cigarettes). You may crave cigarettes, be irritable, feel very hungry, cough often, get headaches, or have difficulty concentrating.  The withdrawal symptoms are only temporary. They are strongest when you first quit but will go away within 10 to 14 days.  When withdrawal symptoms occur, stay in control. Think about your reasons for quitting. Remind yourself that these are signs that your body is healing and getting used to being without cigarettes.  Remember that withdrawal symptoms are easier to treat than the major diseases that smoking can cause.  Even after the withdrawal is over, expect periodic urges to smoke. However, these cravings are generally short-lived and will go away whether you smoke or not. Do not smoke!  If you relapse and smoke again, do not lose hope. Most smokers quit 3 times before they are successful.  If you relapse, do not give up! Plan ahead and think about what you will do the next time you get the urge to smoke. LIFE AS A NONSMOKER: MAKE IT FOR A MONTH, MAKE IT FOR LIFE Day 1: Hang this page where you will see it every day. Day 2: Get rid of all ashtrays, matches, and lighters. Day 3: Drink water. Breathe deeply between sips. Day 4: Avoid places with smoke-filled air, such as bars, clubs, or the smoking section of restaurants. Day 5: Keep track of how much money you save by not smoking. Day 6: Avoid boredom. Keep a good book with you or go to the movies. Day 7: Reward yourself! One week without smoking! Day 8: Make a dental appointment to get your teeth cleaned. Day 9:  Decide how you will turn down a cigarette before it is offered to you. Day 10: Review your reasons for quitting. Day 11: Distract yourself. Stay active to keep your mind off smoking and to relieve tension. Take a walk, exercise, read a book, do a crossword puzzle, or try a new hobby. Day 12: Exercise. Get off the bus before your stop or use stairs instead of escalators. Day 13: Call on friends for support and encouragement. Day 14: Reward yourself! Two weeks without smoking! Day 15: Practice deep breathing exercises. Day 16: Bet a friend that you can stay a nonsmoker. Day 17: Ask to sit in nonsmoking sections of restaurants. Day 18: Hang up "No Smoking" signs. Day 19: Think of yourself as a nonsmoker. Day 20: Each morning, tell yourself you will not smoke. Day 21: Reward yourself! Three weeks without smoking! Day 22: Think of smoking in negative ways. Remember how it stains your teeth, gives you bad breath, and leaves you short of breath. Day 23: Eat a nutritious breakfast. Day 24:Do not relive your days as a smoker. Day 25: Hold a pencil in your hand when talking on the telephone. Day 26: Tell all your friends you do not smoke. Day 27: Think about how much better food tastes. Day 28: Remember, one cigarette is one too many. Day 29: Take up a hobby that will keep your hands busy. Day 30: Congratulations! One month without smoking! Give yourself a big reward. Your caregiver can direct you to community resources or hospitals for support, which may include:  Group support.  Education.  Hypnosis.  Subliminal therapy. Document Released: 05/24/2004 Document Revised: 11/18/2011 Document Reviewed: 06/12/2009 [facility] Patient Information 2013 [place], [place].

## 2013-04-30 ENCOUNTER — Ambulatory Visit (INDEPENDENT_AMBULATORY_CARE_PROVIDER_SITE_OTHER): Payer: 59 | Admitting: Family Medicine

## 2013-04-30 ENCOUNTER — Encounter: Payer: Self-pay | Admitting: Family Medicine

## 2013-04-30 VITALS — BP 126/78 | HR 83 | Temp 99.0°F | Resp 16 | Ht 63.5 in | Wt 168.0 lb

## 2013-04-30 DIAGNOSIS — R251 Tremor, unspecified: Secondary | ICD-10-CM

## 2013-04-30 DIAGNOSIS — E119 Type 2 diabetes mellitus without complications: Secondary | ICD-10-CM

## 2013-04-30 DIAGNOSIS — F17200 Nicotine dependence, unspecified, uncomplicated: Secondary | ICD-10-CM

## 2013-04-30 DIAGNOSIS — Z23 Encounter for immunization: Secondary | ICD-10-CM

## 2013-04-30 DIAGNOSIS — I10 Essential (primary) hypertension: Secondary | ICD-10-CM

## 2013-04-30 DIAGNOSIS — Z79899 Other long term (current) drug therapy: Secondary | ICD-10-CM

## 2013-04-30 MED ORDER — PRIMIDONE 250 MG PO TABS: 250.0000 mg | ORAL_TABLET | Freq: Every day | ORAL | Status: DC

## 2013-04-30 MED ORDER — ATORVASTATIN CALCIUM 20 MG PO TABS: 20.0000 mg | ORAL_TABLET | Freq: Every day | ORAL | Status: DC

## 2013-04-30 MED ORDER — LOSARTAN POTASSIUM 50 MG PO TABS: 50.0000 mg | ORAL_TABLET | Freq: Every day | ORAL | Status: DC

## 2013-04-30 MED ORDER — METFORMIN HCL ER (MOD) 1000 MG PO TB24: 2000.0000 mg | ORAL_TABLET | Freq: Every day | ORAL | Status: DC

## 2013-04-30 MED ORDER — SITAGLIPTIN PHOSPHATE 100 MG PO TABS: 100.0000 mg | ORAL_TABLET | Freq: Every day | ORAL | Status: DC

## 2013-04-30 MED ORDER — SERTRALINE HCL 100 MG PO TABS: 100.0000 mg | ORAL_TABLET | Freq: Every day | ORAL | Status: DC

## 2013-04-30 NOTE — Progress Notes (Signed)
Subjective:    Patient ID: Brittany [name], female    DOB: 04-Jan-1961, 52 y.o.   MRN: [id number] Chief Complaint  Patient presents with  . Diabetes    follow up    HPI  Has allergies/URI.   Pharmacist wouldn't give her long-acting metformin - said to expensive - so STILL just taking 1 tab of the regular metformin in the evenings. Smoking 1 ppd.  Past Medical History  Diagnosis Date  . Diabetes mellitus without complication    Current Outpatient Prescriptions on File Prior to Visit  Medication Sig Dispense Refill  . aspirin 81 MG tablet Take 81 mg by mouth daily.      Marland Kitchen atorvastatin (LIPITOR) 20 MG tablet Take 1 tablet (20 mg total) by mouth daily.  30 tablet  11  . losartan (COZAAR) 50 MG tablet Take 1 tablet (50 mg total) by mouth daily. NEEDS OFFICE VISIT/LABS  30 tablet  11  . metformin (FORTAMET) 1000 MG (OSM) 24 hr tablet Take 2 tablets (2,000 mg total) by mouth every evening.  180 tablet  1  . primidone (MYSOLINE) 250 MG tablet Take 1 tablet (250 mg total) by mouth at bedtime.  90 tablet  1  . sertraline (ZOLOFT) 100 MG tablet Take 1 tablet (100 mg total) by mouth daily.  30 tablet  11   No current facility-administered medications on file prior to visit.   No Known Allergies   Review of Systems  Constitutional: Positive for fatigue. Negative for fever, chills, diaphoresis and appetite change.  HENT: Positive for congestion, postnasal drip and rhinorrhea.   Eyes: Negative for visual disturbance.  Respiratory: Positive for cough. Negative for shortness of breath.   Cardiovascular: Negative for chest pain, palpitations and leg swelling.  Genitourinary: Negative for decreased urine volume.  Neurological: Positive for tremors. Negative for syncope, weakness, numbness and headaches.  Hematological: Does not bruise/bleed easily.      BP 126/78  Pulse 83  Temp(Src) 99 F (37.2 C) (Oral)  Resp 16  Ht 5' 3.5" (1.613 m)  Wt 168 lb (76.204 kg)  BMI 29.29 kg/m2  SpO2  95% Objective:   Physical Exam  Constitutional: She is oriented to person, place, and time. She appears well-developed and well-nourished. No distress.  HENT:  Head: Normocephalic and atraumatic.  Right Ear: External ear normal.  Left Ear: External ear normal.  Eyes: Conjunctivae are normal. No scleral icterus.  Neck: Normal range of motion. Neck supple. No thyromegaly present.  Cardiovascular: Normal rate, regular rhythm, normal heart sounds and intact distal pulses.   Pulmonary/Chest: Effort normal and breath sounds normal. No respiratory distress.  Musculoskeletal: She exhibits no edema.  Lymphadenopathy:    She has no cervical adenopathy.  Neurological: She is alert and oriented to person, place, and time.  Skin: Skin is warm and dry. She is not diaphoretic. No erythema.  Psychiatric: She has a normal mood and affect. Her behavior is normal.          Results for orders placed in visit on 04/30/13  POCT GLYCOSYLATED HEMOGLOBIN (HGB A1C)      Result Value Range   Hemoglobin A1C 9.2     Assessment & Plan:   Tobacco use disorder - pt contemplative but not yet ready to quit  Essential hypertension, benign - well controlled -cont current regimen  Encounter for long-term (current) use of other medications  DM (diabetes mellitus) - Plan: POCT glycosylated hemoglobin (Hb A1C) - a1c continues to steadily worsens and pt is  not compliant w/ bid medicine so really needs to insist that pharmacist give her the ER 24 hrs metformin - if they want me to change to a different brand or formulation needs to contact me at the office and not just tell pt - pt understands and agrees - will start 2 tabs of ER metformin rather than IR this time.  Also start Januvia.  Tremor  Need for Tdap vaccination - Plan: Tdap vaccine greater than or equal to 7yo IM  Meds ordered this encounter  Medications  . metFORMIN (GLUMETZA) 1000 MG (MOD) 24 hr tablet    Sig: Take 2 tablets (2,000 mg total) by mouth  at bedtime.    Dispense:  180 tablet    Refill:  1  . sitaGLIPtin (JANUVIA) 100 MG tablet    Sig: Take 1 tablet (100 mg total) by mouth daily.    Dispense:  90 tablet    Refill:  1  . sertraline (ZOLOFT) 100 MG tablet    Sig: Take 1 tablet (100 mg total) by mouth daily.    Dispense:  90 tablet    Refill:  3  . primidone (MYSOLINE) 250 MG tablet    Sig: Take 1 tablet (250 mg total) by mouth at bedtime.    Dispense:  90 tablet    Refill:  3  . losartan (COZAAR) 50 MG tablet    Sig: Take 1 tablet (50 mg total) by mouth daily.    Dispense:  90 tablet    Refill:  3  . atorvastatin (LIPITOR) 20 MG tablet    Sig: Take 1 tablet (20 mg total) by mouth daily.    Dispense:  90 tablet    Refill:  3    Norberto Sorenson, MD MPH

## 2013-06-12 ENCOUNTER — Other Ambulatory Visit: Payer: Self-pay | Admitting: Emergency Medicine

## 2013-07-30 ENCOUNTER — Ambulatory Visit (INDEPENDENT_AMBULATORY_CARE_PROVIDER_SITE_OTHER): Payer: 59 | Admitting: Family Medicine

## 2013-07-30 ENCOUNTER — Encounter: Payer: Self-pay | Admitting: Family Medicine

## 2013-07-30 VITALS — BP 112/66 | HR 81 | Temp 99.5°F | Resp 16 | Ht 63.25 in | Wt 169.6 lb

## 2013-07-30 DIAGNOSIS — F32A Depression, unspecified: Secondary | ICD-10-CM

## 2013-07-30 DIAGNOSIS — Z79899 Other long term (current) drug therapy: Secondary | ICD-10-CM

## 2013-07-30 DIAGNOSIS — R259 Unspecified abnormal involuntary movements: Secondary | ICD-10-CM

## 2013-07-30 DIAGNOSIS — IMO0001 Reserved for inherently not codable concepts without codable children: Secondary | ICD-10-CM

## 2013-07-30 DIAGNOSIS — E785 Hyperlipidemia, unspecified: Secondary | ICD-10-CM

## 2013-07-30 DIAGNOSIS — I10 Essential (primary) hypertension: Secondary | ICD-10-CM

## 2013-07-30 DIAGNOSIS — R251 Tremor, unspecified: Secondary | ICD-10-CM

## 2013-07-30 DIAGNOSIS — F17200 Nicotine dependence, unspecified, uncomplicated: Secondary | ICD-10-CM

## 2013-07-30 DIAGNOSIS — F329 Major depressive disorder, single episode, unspecified: Secondary | ICD-10-CM

## 2013-07-30 LAB — COMPREHENSIVE METABOLIC PANEL
ALT: 16 U/L (ref 0–35)
Albumin: 4.1 g/dL (ref 3.5–5.2)
CO2: 24 mEq/L (ref 19–32)
Calcium: 9 mg/dL (ref 8.4–10.5)
Chloride: 104 mEq/L (ref 96–112)
Glucose, Bld: 185 mg/dL — ABNORMAL HIGH (ref 70–99)
Sodium: 138 mEq/L (ref 135–145)
Total Bilirubin: 0.3 mg/dL (ref 0.3–1.2)
Total Protein: 6.6 g/dL (ref 6.0–8.3)

## 2013-07-30 LAB — POCT GLYCOSYLATED HEMOGLOBIN (HGB A1C): Hemoglobin A1C: 8.7

## 2013-07-30 LAB — LIPID PANEL
HDL: 45 mg/dL (ref >39)
LDL Cholesterol: 51 mg/dL (ref 0–99)

## 2013-07-30 MED ORDER — METFORMIN HCL 1000 MG PO TABS: 1000.0000 mg | ORAL_TABLET | Freq: Two times a day (BID) | ORAL | Status: DC

## 2013-07-30 MED ORDER — SERTRALINE HCL 100 MG PO TABS: 100.0000 mg | ORAL_TABLET | Freq: Every day | ORAL | Status: DC

## 2013-07-30 MED ORDER — ATORVASTATIN CALCIUM 20 MG PO TABS: 20.0000 mg | ORAL_TABLET | Freq: Every day | ORAL | Status: DC

## 2013-07-30 MED ORDER — LOSARTAN POTASSIUM 50 MG PO TABS: 50.0000 mg | ORAL_TABLET | Freq: Every day | ORAL | Status: DC

## 2013-07-30 MED ORDER — PRIMIDONE 250 MG PO TABS: 250.0000 mg | ORAL_TABLET | Freq: Every day | ORAL | Status: DC

## 2013-07-30 MED ORDER — GLIPIZIDE ER 5 MG PO TB24: 5.0000 mg | ORAL_TABLET | Freq: Every day | ORAL | Status: DC

## 2013-07-30 MED ORDER — SITAGLIPTIN PHOSPHATE 100 MG PO TABS: 100.0000 mg | ORAL_TABLET | Freq: Every day | ORAL | Status: DC

## 2013-07-30 NOTE — Progress Notes (Signed)
Subjective:    Patient ID: Brittany [name], female    DOB: November 09, 1960, 52 y.o.   MRN: [id number] Chief Complaint  Patient presents with  . Diabetes    3 month follow up    HPI  Sugars around 150 in the morning when fasting. She is taking the 2 tabs of long-acting metformin and the [place] every night. Thinks maybe she has lost some weight since last visit. Cut some sugar out - eating less sweets.  For exercise, she is taking care of her elderly parents housework so feels very busy and acitve all day though no formal exercise.  Still smoking the same - still around 1ppd.  Not taking baby asa  Past Medical History  Diagnosis Date  . Diabetes mellitus without complication    Current Outpatient Prescriptions on File Prior to Visit  Medication Sig Dispense Refill  . aspirin 81 MG tablet Take 81 mg by mouth daily.      Marland Kitchen atorvastatin (LIPITOR) 20 MG tablet Take 1 tablet (20 mg total) by mouth daily.  90 tablet  3  . atorvastatin (LIPITOR) 20 MG tablet TAKE 1 TABLET (20 MG TOTAL) BY MOUTH DAILY.  30 tablet  0  . losartan (COZAAR) 50 MG tablet Take 1 tablet (50 mg total) by mouth daily.  90 tablet  3  . losartan (COZAAR) 50 MG tablet TAKE ONE TABLET BY MOUTH DAILY  30 tablet  0  . metFORMIN (GLUMETZA) 1000 MG (MOD) 24 hr tablet Take 2 tablets (2,000 mg total) by mouth at bedtime.  180 tablet  1  . primidone (MYSOLINE) 250 MG tablet Take 1 tablet (250 mg total) by mouth at bedtime.  90 tablet  3  . sertraline (ZOLOFT) 100 MG tablet Take 1 tablet (100 mg total) by mouth daily.  90 tablet  3  . sitaGLIPtin (JANUVIA) 100 MG tablet Take 1 tablet (100 mg total) by mouth daily.  90 tablet  1   No current facility-administered medications on file prior to visit.   No Known Allergies  Review of Systems  Constitutional: Positive for fatigue. Negative for fever, chills, diaphoresis and appetite change.  Eyes: Negative for visual disturbance.  Respiratory: Negative for cough and shortness of  breath.   Cardiovascular: Negative for chest pain, palpitations and leg swelling.  Genitourinary: Negative for decreased urine volume.  Neurological: Negative for syncope and headaches.  Hematological: Does not bruise/bleed easily.      BP 112/66  Pulse 81  Temp(Src) 99.5 F (37.5 C) (Oral)  Resp 16  Ht 5' 3.25" (1.607 m)  Wt 169 lb 9.6 oz (76.93 kg)  BMI 29.79 kg/m2  SpO2 97% Objective:   Physical Exam  Constitutional: She is oriented to person, place, and time. She appears well-developed and well-nourished. No distress.  HENT:  Head: Normocephalic and atraumatic.  Right Ear: External ear normal.  Left Ear: External ear normal.  Eyes: Conjunctivae are normal. No scleral icterus.  Neck: Normal range of motion. Neck supple. No thyromegaly present.  Cardiovascular: Normal rate, regular rhythm, normal heart sounds and intact distal pulses.   Pulmonary/Chest: Effort normal and breath sounds normal. No respiratory distress.  Musculoskeletal: She exhibits no edema.  Lymphadenopathy:    She has no cervical adenopathy.  Neurological: She is alert and oriented to person, place, and time.  Skin: Skin is warm and dry. She is not diaphoretic. No erythema.  Psychiatric: She has a normal mood and affect. Her behavior is normal.  Assessment & Plan:  Needs pneumovax but we are out of today - give at next OV.  Type II or unspecified type diabetes mellitus without mention of complication, uncontrolled - Plan: Comprehensive metabolic panel, POCT glycosylated hemoglobin (Hb A1C) - a1c w/ some improvement since we started [place] 100mg  qhs in addition to her metformin XR 2000mg  qhs at last visit but still not close to goal and pt does not see herself motivated to make any large tlc to decrease a1c in near future so will start glipizide XL 5 qd in addition to [place] and metformin.  Now pt will have to take glipizide in the morning w/ breakfast so will change metformin from XR to IR  formulation since she is going to have to start taking medications bid anyway. Has had problems w/ compliance to bid regimen in past but will try again.  Recheck a1c in 3 mos and can titrate glipizide up further if a1c still >7 at that time.  Restart asa 81mg  qd.  Encounter for long-term (current) use of other medications - Plan: Lipid panel, Comprehensive metabolic panel, POCT glycosylated hemoglobin (Hb A1C)  Tobacco use disorder - Pt contemplative - would like to quit but not yet ready to do the work involved.  Tremor - very well controlled on primidone  Hyperlipidemia LDL goal < 100  Essential hypertension, benign  Depression  Meds ordered this encounter  Medications  . metFORMIN (GLUCOPHAGE) 1000 MG tablet    Sig: Take 1 tablet (1,000 mg total) by mouth 2 (two) times daily with a meal.    Dispense:  180 tablet    Refill:  3  . sitaGLIPtin (JANUVIA) 100 MG tablet    Sig: Take 1 tablet (100 mg total) by mouth daily.    Dispense:  90 tablet    Refill:  3  . glipiZIDE (GLUCOTROL XL) 5 MG 24 hr tablet    Sig: Take 1 tablet (5 mg total) by mouth daily with breakfast.    Dispense:  90 tablet    Refill:  1  . atorvastatin (LIPITOR) 20 MG tablet    Sig: Take 1 tablet (20 mg total) by mouth daily.    Dispense:  90 tablet    Refill:  3  . losartan (COZAAR) 50 MG tablet    Sig: Take 1 tablet (50 mg total) by mouth daily.    Dispense:  90 tablet    Refill:  3  . primidone (MYSOLINE) 250 MG tablet    Sig: Take 1 tablet (250 mg total) by mouth at bedtime.    Dispense:  90 tablet    Refill:  3  . sertraline (ZOLOFT) 100 MG tablet    Sig: Take 1 tablet (100 mg total) by mouth daily.    Dispense:  90 tablet    Refill:  3     Norberto Sorenson, MD MPH

## 2013-07-30 NOTE — Patient Instructions (Signed)
Restart taking a baby aspirin daily.  Start taking a metformin and the glucotrol every morning with a little breakfast. Then take the metformin and januvia every evening with dinner. Take you primidone and your atorvastatin right before bed every night.  Diabetes and Standards of Medical Care  Diabetes is complicated. You may find that your diabetes team includes a dietitian, nurse, diabetes educator, eye doctor, and more. To help everyone know what is going on and to help you get the care you deserve, the following schedule of care was developed to help keep you on track. Below are the tests, exams, vaccines, medicines, education, and plans you will need. HbA1c test This test shows how well you have controlled your glucose over the past 2 3 months. It is used to see if your diabetes management plan needs to be adjusted.   It is performed at least 2 times a year if you are meeting treatment goals.  It is performed 4 times a year if therapy has changed or if you are not meeting treatment goals. Blood pressure test  This test is performed at every routine medical visit. The goal is less than 140/90 mmHg for most people, but 130/80 mmHg in some cases. Ask your health care provider about your goal. Dental exam  Follow up with the dentist regularly. Eye exam  If you are diagnosed with type 1 diabetes as a child, get an exam upon reaching the age of 10 years or older and have had diabetes for 3 5 years. Yearly eye exams are recommended after that initial eye exam.  If you are diagnosed with type 1 diabetes as an adult, get an exam within 5 years of diagnosis and then yearly.  If you are diagnosed with type 2 diabetes, get an exam as soon as possible after the diagnosis and then yearly. Foot care exam  Visual foot exams are performed at every routine medical visit. The exams check for cuts, injuries, or other problems with the feet.  A comprehensive foot exam should be done yearly. This  includes visual inspection as well as assessing foot pulses and testing for loss of sensation.  Check your feet nightly for cuts, injuries, or other problems with your feet. Tell your health care provider if anything is not healing. Kidney function test (urine microalbumin)  This test is performed once a year.  Type 1 diabetes: The first test is performed 5 years after diagnosis.  Type 2 diabetes: The first test is performed at the time of diagnosis.  A serum creatinine and estimated glomerular filtration rate (eGFR) test is done once a year to assess the level of chronic kidney disease (CKD), if present. Lipid profile (cholesterol, HDL, LDL, triglycerides)  Performed every 5 years for most people.  The goal for LDL is less than 100 mg/dL. If you are at high risk, the goal is less than 70 mg/dL.  The goal for HDL is 40 mg/dL 50 mg/dL for men and 50 mg/dL 60 mg/dL for women. An HDL cholesterol of 60 mg/dL or higher gives some protection against heart disease.  The goal for triglycerides is less than 150 mg/dL. Influenza vaccine, pneumococcal vaccine, and hepatitis B vaccine  The influenza vaccine is recommended yearly.  The pneumococcal vaccine is generally given once in a lifetime. However, there are some instances when another vaccination is recommended. Check with your health care provider.  The hepatitis B vaccine is also recommended for adults with diabetes. Diabetes self-management education  Education is recommended  at diagnosis and ongoing as needed. Treatment plan  Your treatment plan is reviewed at every medical visit. Document Released: 06/23/2009 Document Revised: 04/28/2013 Document Reviewed: 01/26/2013 [facility] Patient Information 2014 [place], [place].

## 2013-08-07 ENCOUNTER — Encounter: Payer: Self-pay | Admitting: Family Medicine

## 2013-10-03 ENCOUNTER — Ambulatory Visit (INDEPENDENT_AMBULATORY_CARE_PROVIDER_SITE_OTHER): Payer: 59 | Admitting: Emergency Medicine

## 2013-10-03 VITALS — BP 126/84 | HR 94 | Temp 99.1°F | Resp 16 | Ht 64.0 in | Wt 171.2 lb

## 2013-10-03 DIAGNOSIS — J209 Acute bronchitis, unspecified: Secondary | ICD-10-CM

## 2013-10-03 MED ORDER — AZITHROMYCIN 250 MG PO TABS: ORAL_TABLET | ORAL | Status: DC

## 2013-10-03 MED ORDER — GUAIFENESIN-DM 100-10 MG/5ML PO SYRP: 5.0000 mL | ORAL_SOLUTION | ORAL | Status: DC | PRN

## 2013-10-03 NOTE — Progress Notes (Signed)
[facility] [address], [place] [address] [phone]  Date:  10/03/2013   Name:  Brittany [name]   DOB:  1961-01-20   MRN:  [id number]  PCP:  Norberto Sorenson, MD    Chief Complaint: Headache, Generalized Body Aches and Abdominal Pain   History of Present Illness:  Brittany [name] is a 53 y.o. very pleasant female patient who presents with the following:  Ill since Thursday.  Has a cough that is largely non productive.  Has no wheezing or shortness of breath.  Denies nasal congestion or drainage but does have a post nasal drip.  No fever or chills.  No sore throat, nausea or vomiting.  Has cough severe enough to cause her upper abdominal pain. No improvement with over the counter medications or other home remedies. Denies other complaint or health concern today. 1 ppd smoker  Patient Active Problem List   Diagnosis Date Noted  . Tobacco use disorder 01/22/2013  . Essential hypertension, benign 10/26/2012  . Hyperlipidemia LDL goal < 100 10/26/2012  . Encounter for long-term (current) use of other medications 10/26/2012  . DM (diabetes mellitus) 10/13/2011  . Tremor 10/13/2011  . GERD (gastroesophageal reflux disease) 10/13/2011  . Depression 10/13/2011    Past Medical History  Diagnosis Date  . Diabetes mellitus without complication     Past Surgical History  Procedure Laterality Date  . Cholecystectomy    . Spine surgery      History  Substance Use Topics  . Smoking status: Current Every Day Smoker -- 1.00 packs/day for 2 years    Types: Cigarettes  . Smokeless tobacco: Not on file  . Alcohol Use: No    Family History  Problem Relation Age of Onset  . Cancer Mother   . Diabetes Mother   . Heart disease Mother   . Diabetes Father   . Heart disease Father   . Cancer Maternal Grandmother 62    lung  . COPD Maternal Aunt     No Known Allergies  Medication list has been reviewed and updated.  Current Outpatient Prescriptions on File  Prior to Visit  Medication Sig Dispense Refill  . aspirin 81 MG tablet Take 81 mg by mouth daily.      Marland Kitchen atorvastatin (LIPITOR) 20 MG tablet Take 1 tablet (20 mg total) by mouth daily.  90 tablet  3  . glipiZIDE (GLUCOTROL XL) 5 MG 24 hr tablet Take 1 tablet (5 mg total) by mouth daily with breakfast.  90 tablet  1  . losartan (COZAAR) 50 MG tablet Take 1 tablet (50 mg total) by mouth daily.  90 tablet  3  . metFORMIN (GLUCOPHAGE) 1000 MG tablet Take 1 tablet (1,000 mg total) by mouth 2 (two) times daily with a meal.  180 tablet  3  . primidone (MYSOLINE) 250 MG tablet Take 1 tablet (250 mg total) by mouth at bedtime.  90 tablet  3  . sertraline (ZOLOFT) 100 MG tablet Take 1 tablet (100 mg total) by mouth daily.  90 tablet  3  . sitaGLIPtin (JANUVIA) 100 MG tablet Take 1 tablet (100 mg total) by mouth daily.  90 tablet  3   No current facility-administered medications on file prior to visit.    Review of Systems:  As per HPI, otherwise negative.    Physical Examination: Filed Vitals:   10/03/13 1151  BP: 126/84  Pulse: 94  Temp: 99.1 F (37.3 C)  Resp: 16  Filed Vitals:   10/03/13 1151  Height: 5\' 4"  (1.626 m)  Weight: 171 lb 3.2 oz (77.656 kg)   Body mass index is 29.37 kg/(m^2). Ideal Body Weight: Weight in (lb) to have BMI = 25: 145.3  GEN: WDWN, NAD, Non-toxic, A & O x 3 HEENT: Atraumatic, Normocephalic. Neck supple. No masses, No LAD. Ears and Nose: No external deformity. CV: RRR, No M/G/R. No JVD. No thrill. No extra heart sounds. PULM: CTA B, no wheezes, crackles, rhonchi. No retractions. No resp. distress. No accessory muscle use. ABD: S, NT, ND, +BS. No rebound. No HSM. EXTR: No c/c/e NEURO Normal gait.  PSYCH: Normally interactive. Conversant. Not depressed or anxious appearing.  Calm demeanor.    Assessment and Plan: Bronchitis zpak Robitussin dm Stop smoking  Signed,  Phillips OdorJeffery Rilynn Habel, MD

## 2013-10-03 NOTE — Patient Instructions (Signed)

## 2013-10-04 ENCOUNTER — Telehealth: Payer: Self-pay

## 2013-10-04 NOTE — Progress Notes (Signed)
PA approved for metformin ER through 10/02/2018. PA ref #[id number].

## 2013-10-04 NOTE — Telephone Encounter (Signed)
Received denial of PA for Januvia because pt has not tried/failed covered alternatives which are Onglyza, Tradjenta, or Nesina. Dr Clelia CroftShaw, do you want to send in a Rx for one of these for her to try?

## 2013-10-05 MED ORDER — LINAGLIPTIN 5 MG PO TABS: 5.0000 mg | ORAL_TABLET | Freq: Every day | ORAL | Status: DC

## 2013-10-05 NOTE — Telephone Encounter (Signed)
Pt switched from Januvia to Franceradjenta for insurance cvg.  Thanks.

## 2013-11-03 ENCOUNTER — Ambulatory Visit (INDEPENDENT_AMBULATORY_CARE_PROVIDER_SITE_OTHER): Payer: 59 | Admitting: Family Medicine

## 2013-11-03 ENCOUNTER — Encounter: Payer: Self-pay | Admitting: Family Medicine

## 2013-11-03 VITALS — BP 134/73 | HR 88 | Temp 98.8°F | Resp 16 | Ht 64.0 in | Wt 172.0 lb

## 2013-11-03 DIAGNOSIS — IMO0001 Reserved for inherently not codable concepts without codable children: Secondary | ICD-10-CM

## 2013-11-03 DIAGNOSIS — E1165 Type 2 diabetes mellitus with hyperglycemia: Principal | ICD-10-CM

## 2013-11-03 DIAGNOSIS — I10 Essential (primary) hypertension: Secondary | ICD-10-CM

## 2013-11-03 LAB — POCT GLYCOSYLATED HEMOGLOBIN (HGB A1C): Hemoglobin A1C: 8.7

## 2013-11-03 NOTE — Patient Instructions (Signed)
Exercise to Lose Weight Exercise and a healthy diet may help you lose weight. Your doctor may suggest specific exercises. EXERCISE IDEAS AND TIPS  Choose low-cost things you enjoy doing, such as walking, bicycling, or exercising to workout videos.  Take stairs instead of the elevator.  Walk during your lunch break.  Park your car further away from work or school.  Go to a gym or an exercise class.  Start with 5 to 10 minutes of exercise each day. Build up to 30 minutes of exercise 4 to 6 days a week.  Wear shoes with good support and comfortable clothes.  Stretch before and after working out.  Work out until you breathe harder and your heart beats faster.  Drink extra water when you exercise.  Do not do so much that you hurt yourself, feel dizzy, or get very short of breath. Exercises that burn about 150 calories:  Running 1  miles in 15 minutes.  Playing volleyball for 45 to 60 minutes.  Washing and waxing a car for 45 to 60 minutes.  Playing touch football for 45 minutes.  Walking 1  miles in 35 minutes.  Pushing a stroller 1  miles in 30 minutes.  Playing basketball for 30 minutes.  Raking leaves for 30 minutes.  Bicycling 5 miles in 30 minutes.  Walking 2 miles in 30 minutes.  Dancing for 30 minutes.  Shoveling snow for 15 minutes.  Swimming laps for 20 minutes.  Walking up stairs for 15 minutes.  Bicycling 4 miles in 15 minutes.  Gardening for 30 to 45 minutes.  Jumping rope for 15 minutes.  Washing windows or floors for 45 to 60 minutes. Document Released: 09/28/2010 Document Revised: 11/18/2011 Document Reviewed: 09/28/2010 [facility] Patient Information 2014 [place], [place].    How to Increase Fiber in the Meal Plan for Diabetes Increasing fiber in the diet has many benefits including lowering blood cholesterol, helping to control blood glucose (sugar), preventing constipation, and aiding in weight management by helping you feel full  longer. Start adding fiber to your diet slowly. A gradual substitution of high-fiber foods for low-fiber foods will allow the digestive tract to adjust. Most men under 18 years of age should aim to eat 38 g of fiber a day. Women should aim for 25 g. Over 34 years of age, most men need 30 g of fiber and most women need 21 g. Below are some suggestions for increasing fiber.  Try whole-wheat bread instead of white bread. Look for words high on the list of ingredients, such as whole wheat, whole rye, or whole oats.  Try a baked potato with skin instead of mashed potatoes.  Try a fresh apple with skin instead of applesauce.  Try a fresh orange instead of orange juice.  Try popcorn instead of potato chips.  Try bran cereal instead of corn flakes.  Try kidney, whole pinto, or garbanzo beans instead of bread.  Try whole-grain crackers instead of saltine crackers.  Try whole-wheat pasta instead of regular varieties. While on a high-fiber diet:   Drink enough water and fluids to keep your urine clear or pale yellow.  Eat a variety of high fiber foods such as fruits, vegetables, whole grains, nuts, and seeds.  Aim for 5 servings of fruit and vegetables per day.  Try to increase your intake of fiber by eating high-fiber foods instead of taking fiber supplements that contain small amounts of fiber. There can be additional benefits for long-term health and blood glucose control with  high-fiber foods.  SOURCES OF FIBER The following list shows the average dietary fiber for types of food in the various food groups. Starches and Breads / Dietary Fiber (g)  Whole-grain bread, 1 slice / 2 g  Whole-grain cereals,  cup / 3 g  Bran cereals,  to  cup / 8 g  Starchy vegetables,  cup / 3 g  Oatmeal,  cup / 2 g  Whole-wheat pasta,  cup / 2 g  Brown rice,  cup / 2 g  Barley,  cup / 3 g Legumes / Dietary Fiber (g)  Beans,  cup / 8 g  Peas,  cup / 8 g  Lentils ,  cup / 8 g Meat and  Meat Substitutes / Dietary Fiber (g) This group averages 0 grams of fiber. Exceptions are:  Nuts, seeds, 1 oz or  cup / 3 g  Chunky peanut butter, 2 tbs / 3 g Vegetables / Dietary Fiber (g)  Cooked vegetables,  to  cup / 2 to 3 g  Raw vegetables, 1 to 2 cups / 2 to 3 g Fruit / Dietary Fiber (g)  Raw or cooked fruit,  cup or 1 small, fresh piece / 2 g Milk / Dietary Fiber (g)  Milk, 1 cup or 8 oz / 0 g Fats and Oils / Dietary Fiber (g)  Fats and oils, 1 tsp / 0 g You can determine how much fiber you are eating by reading the Nutrition Facts panel on the labels of the foods you eat. FIBER IN SPECIFIC FOODS Cereals / Dietary Fiber (g)  All Bran,  cup / 9 g  All Bran with Extra Fiber,  cup / 13 g  Bran Flakes,  cup / 4 g  Cheerios,  cup / 1.5 g  Corn Bran,  cup / 4 g  Corn Flakes,  cup / 0.75 g  Cracklin' Oat Bran,  cup / 4 g  Fiber One,  cup / 13 g  Grape Nuts, 3 tbs / 3 g  Grape Nuts Flakes,  cup / 3 g  Noodles,  cup, cooked / 0.5 g  Nutrigrain Wheat,  cup / 3.5 g  Oatmeal,  cup, cooked / 1.1 g  Pasta, white (macaroni, spaghetti),  cup, cooked / 0.5 g  Pasta, whole-wheat (macaroni, spaghetti),  cup, cooked / 2 g  [place],  cup, cooked / 3 g  Rice, wild,  cup, cooked / 0.5 g  Rice, brown,  cup, cooked / 1 g  Rice, white,  cup, cooked / 0.2 g  Shredded Wheat, bite-sized,  cup / 2 g  Total,  cup / 1.75 g  Wheat Chex,  cup / 2.5 g  Wheatena,  cup, cooked / 4 g  Wheaties,  cup / 2.75 g Bread, Starchy Vegetables, and Dried Peas and Beans / Dietary Fiber (g)  Bagel, whole / 0.6 g  Baked beans in tomato sauce,  cup, cooked / 3 g  Bran muffin, 1 small / 2.5 g  Bread, cracked wheat, 1 slice / 2.5 g  Bread, pumpernickel, 1 slice / 2.5 g  Bread, white, 1 slice / 0.4 g  Bread, whole-wheat, 1 slice / 1.4 g  Corn,  cup, canned / 2.9 g  Kidney beans,  cup, cooked / 3.5 g  Lentils, cup, cooked / 3  g  Lima beans,  cup, cooked / 4 g  Navy beans,  cup, cooked / 4 g  Peas,  cup, cooked / 4 g  Popcorn, 3  cups popped, unbuttered / 3.5 g  Potato, baked (with skin), 1 small / 4 g  Potato, baked (without skin), 1 small / 2 g  Ry-Krisp, 4 crackers / 3 g  Saltine crackers, 6 squares / 0 g  Split peas,  cup, cooked / 2.5 g  Yams (sweet potato),  cup / 1.7 g Fruit / Dietary Fiber (g)  Apple, 1 small, fresh, with skin / 4 g  Apple juice,  cup / 0.4 g  Apricots, 4 medium, fresh / 4 g  Apricots, 7 halves, dried / 2 g  Banana,  medium / 1.2 g  Blueberries,  cup / 2 g  Cantaloupe, melon / 1.3 g  Cherries,  cup, canned / 1.4 g  Grapefruit,  medium / 1.6 g  Grapes, 15 small / 1.2 g  Grape juice,  cup / 0.5 g  Orange, 1 medium, fresh / 2 g  Orange juice,  cup / 0.5 g  Peach, 1 medium,fresh, with skin / 2 g  Pear, 1 medium, fresh, with skin / 4 g  Pineapple, cup, canned / 0.7 g  Plums, 2 whole / 2 g  Prunes, 3 whole / 1.5 g  Raspberries, 1 cup / 6 g  Strawberries, 1  cup / 4 g  Watermelon, 1  cup / 0.5 g Vegetables / Dietary Fiber (g)  Asparagus,  cup, cooked / 1 g  Beans, green and wax,  cup, cooked / 1.6 g  Beets,  cup, cooked / 1.8 g  Broccoli,  cup, cooked / 2.2 g  Brussels sprouts,  cup, cooked / 4 g  Cabbage,  cup, cooked / 2.5 g  Carrots,  cup, cooked / 2.3 g  Cauliflower,  cup, cooked / 1.1 g  Celery, 1 cup, raw / 1.5 g  Cucumber, 1 cup, raw / 0.8 g  Green pepper,  cup sliced, cooked / 1.5 g  Lettuce, 1 cup, sliced / 0.9 g  Mushrooms, 1 cup sliced, raw / 1.8 g  Onion, 1 cup sliced, raw / 1.6 g  Spinach,  cup, cooked / 2.4 g  Tomato, 1 medium, fresh / 1.5 g  Tomato juice,  cup / 0 g  Zucchini,  cup, cooked / 1.8 g Document Released: 02/15/2002 Document Revised: 12/21/2012 Document Reviewed: 03/14/2009 [facility] Patient Information 2014 [place].    Your Diabetes is not as well controlled as  we would like. Try to reduce starches and eliminate artificial sweeteners. Get more active and drink plenty of water to help improve your metabolism. See you in 4 months. Good luck!!!

## 2013-11-03 NOTE — Progress Notes (Signed)
S:  This 53 y.o. Cauc female is here for HTN and DM follow-up. Last A1c in Nov 2014= 8.7%. Pt has made greater efforts to self-manage DM; she has not lost any weight but is making healthier nutrition choices. She has not established an exercise program yet. Medication compliance is very good w/o mention of adverse effects.  Patient Active Problem List   Diagnosis Date Noted  . Tobacco use disorder 01/22/2013  . Essential hypertension, benign 10/26/2012  . Hyperlipidemia LDL goal < 100 10/26/2012  . Encounter for long-term (current) use of other medications 10/26/2012  . DM (diabetes mellitus) 10/13/2011  . Tremor 10/13/2011  . GERD (gastroesophageal reflux disease) 10/13/2011  . Depression 10/13/2011   Prior to Admission medications   Medication Sig Start Date End Date Taking? Authorizing Provider  aspirin 81 MG tablet Take 81 mg by mouth daily.   Yes Historical Provider, MD  atorvastatin (LIPITOR) 20 MG tablet Take 1 tablet (20 mg total) by mouth daily. 07/30/13  Yes Sherren MochaEva N Shaw, MD  glipiZIDE (GLUCOTROL XL) 5 MG 24 hr tablet Take 1 tablet (5 mg total) by mouth daily with breakfast. 07/30/13  Yes Sherren MochaEva N Shaw, MD  linagliptin (TRADJENTA) 5 MG TABS tablet Take 1 tablet (5 mg total) by mouth daily. 10/05/13  Yes Sherren MochaEva N Shaw, MD  losartan (COZAAR) 50 MG tablet Take 1 tablet (50 mg total) by mouth daily. 07/30/13  Yes Sherren MochaEva N Shaw, MD  metFORMIN (GLUCOPHAGE) 1000 MG tablet Take 1 tablet (1,000 mg total) by mouth 2 (two) times daily with a meal. 07/30/13  Yes Sherren MochaEva N Shaw, MD  primidone (MYSOLINE) 250 MG tablet Take 1 tablet (250 mg total) by mouth at bedtime. 07/30/13  Yes Sherren MochaEva N Shaw, MD  sertraline (ZOLOFT) 100 MG tablet Take 1 tablet (100 mg total) by mouth daily. 07/30/13  Yes Sherren MochaEva N Shaw, MD   PMHx, Surg Hx, Soc and Fam Hx reviewed.  ROS: Negative for fatigue, abnormal weight loss or gain, appetite change, vision disturbances, CP or tightness, palpitations, edema, cough, SOB or DOE, GI upset,  myalgias or arthralgias, skin changes, jaundice, HA, dizziness, numbness, weakness or syncope.  O: Filed Vitals:   11/03/13 0847  BP: 134/73  Pulse: 88  Temp: 98.8 F (37.1 C)  Resp: 16   GEN: In NAD: WN,WD. HENT: [place]/AT; EOMI w/ clear conj/sclerae. Otherwise unremarkable. COR: RRR. LUNGS: Unlabored resp. SKIN: W&D; intact w/o diaphoresis or erythema. NEURO: A&O x 3; CNs intact. Nonfocal.  Results for orders placed in visit on 11/03/13  POCT GLYCOSYLATED HEMOGLOBIN (HGB A1C)      Result Value Ref Range   Hemoglobin A1C 8.7      A/P: Type II or unspecified type diabetes mellitus without mention of complication, uncontrolled - Improved control; encouraged nutrition modification and increased physical activity.   Plan: HM Diabetes Foot Exam, POCT glycosylated hemoglobin (Hb A1C)  Essential hypertension, benign- Stable and controlled.

## 2013-12-03 ENCOUNTER — Encounter: Payer: Self-pay | Admitting: Family Medicine

## 2013-12-03 ENCOUNTER — Ambulatory Visit (INDEPENDENT_AMBULATORY_CARE_PROVIDER_SITE_OTHER): Payer: 59 | Admitting: Family Medicine

## 2013-12-03 VITALS — BP 122/70 | HR 74 | Temp 99.3°F | Resp 16 | Ht 63.0 in | Wt 169.2 lb

## 2013-12-03 DIAGNOSIS — F09 Unspecified mental disorder due to known physiological condition: Secondary | ICD-10-CM

## 2013-12-03 DIAGNOSIS — T148XXA Other injury of unspecified body region, initial encounter: Secondary | ICD-10-CM

## 2013-12-03 DIAGNOSIS — R0683 Snoring: Secondary | ICD-10-CM

## 2013-12-03 DIAGNOSIS — IMO0001 Reserved for inherently not codable concepts without codable children: Secondary | ICD-10-CM

## 2013-12-03 DIAGNOSIS — E785 Hyperlipidemia, unspecified: Secondary | ICD-10-CM

## 2013-12-03 DIAGNOSIS — E1165 Type 2 diabetes mellitus with hyperglycemia: Secondary | ICD-10-CM

## 2013-12-03 DIAGNOSIS — R4189 Other symptoms and signs involving cognitive functions and awareness: Secondary | ICD-10-CM

## 2013-12-03 LAB — CBC WITH DIFFERENTIAL/PLATELET
BASOS PCT: 0 % (ref 0–1)
Basophils Absolute: 0 10*3/uL (ref 0.0–0.1)
EOS ABS: 0.2 10*3/uL (ref 0.0–0.7)
Eosinophils Relative: 4 % (ref 0–5)
HCT: 39.3 % (ref 36.0–46.0)
HEMOGLOBIN: 13.8 g/dL (ref 12.0–15.0)
Lymphocytes Relative: 33 % (ref 12–46)
Lymphs Abs: 1.9 10*3/uL (ref 0.7–4.0)
MCH: 30.5 pg (ref 26.0–34.0)
MCHC: 35.1 g/dL (ref 30.0–36.0)
MCV: 86.8 fL (ref 78.0–100.0)
Monocytes Absolute: 0.4 10*3/uL (ref 0.1–1.0)
Monocytes Relative: 6 % (ref 3–12)
NEUTROS PCT: 57 % (ref 43–77)
Neutro Abs: 3.4 10*3/uL (ref 1.7–7.7)
PLATELETS: 253 10*3/uL (ref 150–400)
RBC: 4.53 MIL/uL (ref 3.87–5.11)
RDW: 12.8 % (ref 11.5–15.5)
WBC: 5.9 10*3/uL (ref 4.0–10.5)

## 2013-12-03 LAB — BASIC METABOLIC PANEL
BUN: 10 mg/dL (ref 6–23)
CHLORIDE: 105 meq/L (ref 96–112)
CO2: 23 meq/L (ref 19–32)
CREATININE: 0.57 mg/dL (ref 0.50–1.10)
Calcium: 9.1 mg/dL (ref 8.4–10.5)
Glucose, Bld: 186 mg/dL — ABNORMAL HIGH (ref 70–99)
Potassium: 4.4 mEq/L (ref 3.5–5.3)
Sodium: 137 mEq/L (ref 135–145)

## 2013-12-03 LAB — LDL CHOLESTEROL, DIRECT: LDL DIRECT: 61 mg/dL

## 2013-12-03 LAB — THYROID PANEL WITH TSH
Free Thyroxine Index: 2.6 (ref 1.0–3.9)
T3 Uptake: 35.2 % (ref 22.5–37.0)
T4, Total: 7.3 ug/dL (ref 5.0–12.5)
TSH: 1.67 u[IU]/mL (ref 0.350–4.500)

## 2013-12-03 LAB — GLUCOSE, POCT (MANUAL RESULT ENTRY): POC GLUCOSE: 205 mg/dL — AB (ref 70–99)

## 2013-12-03 MED ORDER — SERTRALINE HCL 50 MG PO TABS: ORAL_TABLET | ORAL | Status: DC

## 2013-12-03 NOTE — Patient Instructions (Addendum)
MEDICATION SIDE EFFECTS:  Primidone- drowsiness, unsteady gait, dizziness, abnormal eye movements (nystagmus).  Continue taking this medication because you are not taking a high dose.                                                       Sertraline- excessive sleepiness, tremor, agitation, dry mouth, sweating.  I think we should try reducing this medication to 75 mg at bedtime.                                                        Atorvastatin- dizziness, blurred vision, headache, muscle aches, increased A1c and glucose, cognitive impairment. I want you to hold this medication and we can see if your thinking improves.

## 2013-12-03 NOTE — Progress Notes (Signed)
Subjective:    Patient ID: Brittany [name], female    DOB: September 09, 1961, 53 y.o.   MRN: [id number]  HPI  This 53 y.o. Cauc female has HTN, Type II DM and depression in remission on current medications. 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 She checks FSBS at least once a day; no hypoglycemic episodes or symptoms. Misses occasional dose of Metformin. She also reports weak arms and hands and unexplained bruising.Marland Kitchen. Her family hx is significant for grandmother and father having dementia.  Sleep disturbance- pt states her husband comments on snoring. Pt awakens not fully rested. Pt has never had a sleep study. Also notes that job is quite stressful. She is concerned because, if mistakes continue, her job is in jeopardy.  Patient Active Problem List   Diagnosis Date Noted  . Tobacco use disorder 01/22/2013  . Essential hypertension, benign 10/26/2012  . Hyperlipidemia LDL goal < 100 10/26/2012  . Encounter for long-term (current) use of other medications 10/26/2012  . DM (diabetes mellitus) 10/13/2011  . Tremor 10/13/2011  . GERD (gastroesophageal reflux disease) 10/13/2011  . Depression 10/13/2011   Prior to Admission medications   Medication Sig Start Date End Date Taking? Authorizing Provider  aspirin 81 MG tablet Take 81 mg by mouth daily.   Yes Historical Provider, MD  atorvastatin (LIPITOR) 20 MG tablet Take 1 tablet (20 mg total) by mouth daily. 07/30/13  Yes Sherren MochaEva N Shaw, MD  linagliptin (TRADJENTA) 5 MG TABS tablet Take 1 tablet (5 mg total) by mouth daily. 10/05/13  Yes Sherren MochaEva N Shaw, MD  losartan (COZAAR) 50 MG tablet Take 1 tablet (50 mg total) by mouth daily. 07/30/13  Yes Sherren MochaEva N Shaw, MD  metFORMIN (GLUCOPHAGE) 1000 MG tablet Take 1 tablet (1,000 mg total) by mouth 2 (two) times daily with a meal. 07/30/13  Yes Sherren MochaEva N Shaw, MD  primidone (MYSOLINE) 250 MG tablet Take 1 tablet (250 mg total) by mouth at bedtime. 07/30/13   Yes Sherren MochaEva N Shaw, MD  sertraline (ZOLOFT) 100 MG tablet Take 1 tablet (100 mg total) by mouth daily. 07/30/13  Yes Sherren MochaEva N Shaw, MD         glipiZIDE (GLUCOTROL XL) 5 MG 24 hr tablet Take 1 tablet (5 mg total) by mouth daily with breakfast. 07/30/13   Sherren MochaEva N Shaw, MD  sertraline (ZOLOFT) 50 MG tablet Take 1 1/2 tablets at bedtime. 12/03/13   Maurice MarchBarbara B Terran Hollenkamp, MD   PMHx, Surg Hx, Soc and Fam Hx reviewed.   Review of Systems  Constitutional: Positive for fatigue. Negative for fever, chills, activity change and unexpected weight change.  HENT: Negative.   Eyes: Negative.   Respiratory: Negative for apnea, cough, chest tightness and shortness of breath.        Snores.  Cardiovascular: Negative.   Gastrointestinal: Negative.   Endocrine: Negative.   Musculoskeletal: Negative.   Skin: Negative.   Neurological: Positive for weakness. Negative for dizziness, syncope, facial asymmetry, speech difficulty, light-headedness, numbness and headaches.  Hematological: Bruises/bleeds easily.  Psychiatric/Behavioral: Positive for sleep disturbance.       Objective:   Physical Exam  Nursing note and vitals reviewed. Constitutional: She is oriented to person, place, and time. She appears well-developed and well-nourished. No distress.  HENT:  Head: Normocephalic and atraumatic.  Right Ear: External ear normal.  Left Ear: External ear normal.  Nose: Nose normal.  Mouth/Throat: Oropharynx is clear and moist.  Eyes: Conjunctivae and  EOM are normal. Pupils are equal, round, and reactive to light. No scleral icterus.  Neck: Normal range of motion. Neck supple. No thyromegaly present.  Cardiovascular: Normal rate and regular rhythm.   Pulmonary/Chest: Effort normal. No respiratory distress.  Musculoskeletal: Normal range of motion. She exhibits no edema and no tenderness.  Neurological: She is alert and oriented to person, place, and time. She displays tremor. She displays no atrophy. No cranial nerve  deficit or sensory deficit. She exhibits normal muscle tone. Coordination and gait normal.  Skin: Skin is warm, dry and intact. Bruising noted. No rash noted. She is not diaphoretic. No cyanosis or erythema. No pallor.  Psychiatric: Her speech is normal. Judgment and thought content normal. Her affect is blunt. Her affect is not inappropriate. She is slowed. She is not agitated. Cognition and memory are normal. She does not exhibit a depressed mood. She is attentive.    Results for orders placed in visit on 12/03/13  GLUCOSE, POCT (MANUAL RESULT ENTRY)      Result Value Ref Range   POC Glucose 205 (*) 70 - 99 mg/dl      Assessment & Plan:  Cognitive impairment - Pt taking several medications that have somnolence and dizziness as side effects. Will try reducing medications where feasible and seeing if cognition improves. Reduce Sertraline from 100 mg to 75 mg daily. I have given her a prescription for 50 mg tablets so she can take 1 1/2 tablets daily. She still has 100 mg tablets if the dose needs to be increased in the future.  Plan: Basic metabolic panel, Thyroid Panel With TSH, Vitamin D, 25-hydroxy, Ambulatory referral to Neurology to evaluate for Sleep Apnea.  Bruising - Plan: CBC with Differential  Type II or unspecified type diabetes mellitus without mention of complication, uncontrolled - A1c last month= 8.7%. Pt encouraged to improve nutrition and medication compliance.  Plan: Basic metabolic panel, Thyroid Panel With TSH, POCT glucose (manual entry)  Hyperlipidemia LDL goal < 100 - Hold Atorvastatin due to side effects and see if weakness subsides as well as cognition improves.    Plan: LDL Cholesterol, Direct  Snoring - Plan: Ambulatory referral to Neurology

## 2013-12-04 ENCOUNTER — Telehealth: Payer: Self-pay | Admitting: Family Medicine

## 2013-12-04 LAB — VITAMIN D 25 HYDROXY (VIT D DEFICIENCY, FRACTURES): Vit D, 25-Hydroxy: 14 ng/mL — ABNORMAL LOW (ref 30–89)

## 2013-12-04 NOTE — Telephone Encounter (Signed)
Opened in error. No phone call placed.

## 2013-12-05 ENCOUNTER — Other Ambulatory Visit: Payer: Self-pay | Admitting: Family Medicine

## 2013-12-05 MED ORDER — ERGOCALCIFEROL 1.25 MG (50000 UT) PO CAPS: 50000.0000 [IU] | ORAL_CAPSULE | ORAL | Status: DC

## 2013-12-05 NOTE — Progress Notes (Signed)
Quick Note:  Please advise pt regarding following labs... All labs look normal (blood sugar is above normal as discussed); kidney function is normal. Thyroid gland function is normal. Complete blood counts are normal.  Vitamin D level is below normal. I am prescribing a supplement to be taken once a week for several weeks. We will review these labs again at your office visit.  Copy to pt. ______

## 2013-12-06 DIAGNOSIS — R0683 Snoring: Secondary | ICD-10-CM | POA: Insufficient documentation

## 2013-12-20 ENCOUNTER — Ambulatory Visit (INDEPENDENT_AMBULATORY_CARE_PROVIDER_SITE_OTHER): Payer: 59 | Admitting: Neurology

## 2013-12-20 ENCOUNTER — Encounter: Payer: Self-pay | Admitting: Neurology

## 2013-12-20 VITALS — BP 123/84 | HR 88 | Resp 16 | Ht 64.75 in | Wt 169.0 lb

## 2013-12-20 DIAGNOSIS — E119 Type 2 diabetes mellitus without complications: Secondary | ICD-10-CM

## 2013-12-20 DIAGNOSIS — R0989 Other specified symptoms and signs involving the circulatory and respiratory systems: Secondary | ICD-10-CM

## 2013-12-20 DIAGNOSIS — G4700 Insomnia, unspecified: Secondary | ICD-10-CM

## 2013-12-20 DIAGNOSIS — R0683 Snoring: Secondary | ICD-10-CM

## 2013-12-20 DIAGNOSIS — G478 Other sleep disorders: Secondary | ICD-10-CM

## 2013-12-20 DIAGNOSIS — R0609 Other forms of dyspnea: Secondary | ICD-10-CM

## 2013-12-20 DIAGNOSIS — F5101 Primary insomnia: Secondary | ICD-10-CM | POA: Insufficient documentation

## 2013-12-20 NOTE — Patient Instructions (Signed)
Smoking Cessation Quitting smoking is important to your health and has many advantages. However, it is not always easy to quit since nicotine is a very addictive drug. Often times, people try 3 times or more before being able to quit. This document explains the best ways for you to prepare to quit smoking. Quitting takes hard work and a lot of effort, but you can do it. ADVANTAGES OF QUITTING SMOKING  You will live longer, feel better, and live better.  Your body will feel the impact of quitting smoking almost immediately.  Within 20 minutes, blood pressure decreases. Your pulse returns to its normal level.  After 8 hours, carbon monoxide levels in the blood return to normal. Your oxygen level increases.  After 24 hours, the chance of having a heart attack starts to decrease. Your breath, hair, and body stop smelling like smoke.  After 48 hours, damaged nerve endings begin to recover. Your sense of taste and smell improve.  After 72 hours, the body is virtually free of nicotine. Your bronchial tubes relax and breathing becomes easier.  After 2 to 12 weeks, lungs can hold more air. Exercise becomes easier and circulation improves.  The risk of having a heart attack, stroke, cancer, or lung disease is greatly reduced.  After 1 year, the risk of coronary heart disease is cut in half.  After 5 years, the risk of stroke falls to the same as a nonsmoker.  After 10 years, the risk of lung cancer is cut in half and the risk of other cancers decreases significantly.  After 15 years, the risk of coronary heart disease drops, usually to the level of a nonsmoker.  If you are pregnant, quitting smoking will improve your chances of having a healthy baby.  The people you live with, especially any children, will be healthier.  You will have extra money to spend on things other than cigarettes. QUESTIONS TO THINK ABOUT BEFORE ATTEMPTING TO QUIT You may want to talk about your answers with your  caregiver.  Why do you want to quit?  If you tried to quit in the past, what helped and what did not?  What will be the most difficult situations for you after you quit? How will you plan to handle them?  Who can help you through the tough times? Your family? Friends? A caregiver?  What pleasures do you get from smoking? What ways can you still get pleasure if you quit? Here are some questions to ask your caregiver:  How can you help me to be successful at quitting?  What medicine do you think would be best for me and how should I take it?  What should I do if I need more help?  What is smoking withdrawal like? How can I get information on withdrawal? GET READY  Set a quit date.  Change your environment by getting rid of all cigarettes, ashtrays, matches, and lighters in your home, car, or work. Do not let people smoke in your home.  Review your past attempts to quit. Think about what worked and what did not. GET SUPPORT AND ENCOURAGEMENT You have a better chance of being successful if you have help. You can get support in many ways.  Tell your family, friends, and co-workers that you are going to quit and need their support. Ask them not to smoke around you.  Get individual, group, or telephone counseling and support. Programs are available at local hospitals and health centers. Call your local health department for   information about programs in your area.  Spiritual beliefs and practices may help some smokers quit.  Download a "quit meter" on your computer to keep track of quit statistics, such as how long you have gone without smoking, cigarettes not smoked, and money saved.  Get a self-help book about quitting smoking and staying off of tobacco. LEARN NEW SKILLS AND BEHAVIORS  Distract yourself from urges to smoke. Talk to someone, go for a walk, or occupy your time with a task.  Change your normal routine. Take a different route to work. Drink tea instead of coffee.  Eat breakfast in a different place.  Reduce your stress. Take a hot bath, exercise, or read a book.  Plan something enjoyable to do every day. Reward yourself for not smoking.  Explore interactive web-based programs that specialize in helping you quit. GET MEDICINE AND USE IT CORRECTLY Medicines can help you stop smoking and decrease the urge to smoke. Combining medicine with the above behavioral methods and support can greatly increase your chances of successfully quitting smoking.  Nicotine replacement therapy helps deliver nicotine to your body without the negative effects and risks of smoking. Nicotine replacement therapy includes nicotine gum, lozenges, inhalers, nasal sprays, and skin patches. Some may be available over-the-counter and others require a prescription.  Antidepressant medicine helps people abstain from smoking, but how this works is unknown. This medicine is available by prescription.  Nicotinic receptor partial agonist medicine simulates the effect of nicotine in your brain. This medicine is available by prescription. Ask your caregiver for advice about which medicines to use and how to use them based on your health history. Your caregiver will tell you what side effects to look out for if you choose to be on a medicine or therapy. Carefully read the information on the package. Do not use any other product containing nicotine while using a nicotine replacement product.  RELAPSE OR DIFFICULT SITUATIONS Most relapses occur within the first 3 months after quitting. Do not be discouraged if you start smoking again. Remember, most people try several times before finally quitting. You may have symptoms of withdrawal because your body is used to nicotine. You may crave cigarettes, be irritable, feel very hungry, cough often, get headaches, or have difficulty concentrating. The withdrawal symptoms are only temporary. They are strongest when you first quit, but they will go away within  10 14 days. To reduce the chances of relapse, try to:  Avoid drinking alcohol. Drinking lowers your chances of successfully quitting.  Reduce the amount of caffeine you consume. Once you quit smoking, the amount of caffeine in your body increases and can give you symptoms, such as a rapid heartbeat, sweating, and anxiety.  Avoid smokers because they can make you want to smoke.  Do not let weight gain distract you. Many smokers will gain weight when they quit, usually less than 10 pounds. Eat a healthy diet and stay active. You can always lose the weight gained after you quit.  Find ways to improve your mood other than smoking. FOR MORE INFORMATION  www.smokefree.gov  Document Released: 08/20/2001 Document Revised: 02/25/2012 Document Reviewed: 12/05/2011 [employer] Patient Information 2014 [employer].  

## 2013-12-20 NOTE — Progress Notes (Signed)
[facility]  Provider:  Melvyn Novasarmen  Zeffie Bickert, M D  Referring Provider: Sherren MochaShaw, [name], MD Primary Care Physician:  Norberto SorensonSHAW,EVA, MD  Chief Complaint  Patient presents with  . New Evaluation    Room 11  . Sleep consult    HPI:  Brittany [name] is a 53 y.o. caucasian , married , right handed  female . She is seen here as a referral from Dr. Norberto SorensonEva Shaw for a sleep problem.  Brittany [name] reports having difficulties to fall asleep, but can stay asleep through the rest of the night.  Her husband shares her bedroom and her son lives in the parental home, too. Both report thunderous , loud snoring. The family dog  ( one of 4) no longer sleeps in the bedroom, which her husband , who is almost deaf, attributed to the snoring.   The patient rises at 7:30 AM, she needs an alarm.  She feels often not refreshed. He does not take breakfast, she will have a cigarette and coffee at home, at work she will have a coffee and oatmeal.  She works in a cubicle.The patient works for a [employer] and has variable hours. On Monday and Wednesday from 9 AM to 19.30 hours, the rest of the week 8 AM  to 17.00. and Saturdays 9 to 12 . She has natural daylight exposure at her workplace. She has diabetes and feels that one hour after any meal she gets sleepy. When home she plays on the lab-computer and falls asleep in front of the screen , often for 3 hours. She doesn't exercise and doesn't walk outside.  She transfers to the bedroom around 1.30 , some times falls asleep quickly, sometimes not. She remembers few dreams, feels she hardly dreams for a year or so. No nocturia.  She first heart her husband complaining about her snoring about one year ago, after the removal of all maxillary teeth. She has gained over the last year, too.  She likes to sleep on one pillow, on the side.  Her bedroom is dark and quiet. Her husband watches TV in bed, but she switches it off when she comes to bed.     She had a remote  cervical fusion with anterior access in 2010.  She used to work in a [employer] in her 20's , at  [employer] , with noise and dust exposure.  She is married for 30 years ,  has 2 children, living still at home. The younger is 6521.      Review of Systems: Out of a complete 14 system review, the patient complains of only the following symptoms, and all other reviewed systems are negative. Snoring. FSS is 17, she likes to stay busy. Epworth is 8 points, with a 3 hour nap. Weight gain.  She has noted dry skin and depression over the last 24 month. Has been in menopausal.   History   Social History  . Marital Status: Married    Spouse Name: [name]    Number of Children: 2  . Years of Education: 14   Occupational History  . Not on file.   Social History Main Topics  . Smoking status: Current Every Day Smoker -- 1.00 packs/day for 2 years    Types: Cigarettes  . Smokeless tobacco: Never Used  . Alcohol Use: Yes     Comment: rarely  . Drug Use: No  . Sexual Activity: Not on file   Other Topics Concern  .  Not on file   Social History Narrative   Patient is married ([name]) and lives at home with her family   Patient has two children.   Patient works at [employer].   Patient has a college education.   Patient is right-handed.   Patient drinks four cups of coffee M-F.          Family History  Problem Relation Age of Onset  . Cancer Mother   . Diabetes Mother   . Heart disease Mother   . Diabetes Father   . Heart disease Father   . Cancer Maternal Grandmother 62    lung  . COPD Maternal Aunt     Past Medical History  Diagnosis Date  . Diabetes mellitus without complication     Past Surgical History  Procedure Laterality Date  . Cholecystectomy    . Spine surgery      Current Outpatient Prescriptions  Medication Sig Dispense Refill  . aspirin 81 MG tablet Take 81 mg by mouth daily.      . ergocalciferol (DRISDOL) 50000 UNITS capsule Take 1 capsule  (50,000 Units total) by mouth once a week.  4 capsule  5  . linagliptin (TRADJENTA) 5 MG TABS tablet Take 1 tablet (5 mg total) by mouth daily.  90 tablet  1  . losartan (COZAAR) 50 MG tablet Take 1 tablet (50 mg total) by mouth daily.  90 tablet  3  . metFORMIN (GLUCOPHAGE) 1000 MG tablet Take 1 tablet (1,000 mg total) by mouth 2 (two) times daily with a meal.  180 tablet  3  . primidone (MYSOLINE) 250 MG tablet Take 1 tablet (250 mg total) by mouth at bedtime.  90 tablet  3  . sertraline (ZOLOFT) 50 MG tablet Take 1 1/2 tablets at bedtime.  45 tablet  1   No current facility-administered medications for this visit.    Allergies as of 12/20/2013  . (No Known Allergies)    Vitals: BP 123/84  Pulse 88  Resp 16  Ht 5' 4.75" (1.645 m)  Wt 169 lb (76.658 kg)  BMI 28.33 kg/m2 Last Weight:  Wt Readings from Last 1 Encounters:  12/20/13 169 lb (76.658 kg)   Last Height:   Ht Readings from Last 1 Encounters:  12/20/13 5' 4.75" (1.645 m)    Physical exam:  General: The patient is awake, alert and appears not in acute distress. The patient is well groomed. Head: Normocephalic, atraumatic. Neck is supple. Mallampati 4 , neck circumference: 15 inches, over bite ( retrognathia)  now with complete upper dentures partially improved, TMJ click but not pain.  Normal swallowing.  Cardiovascular:  Regular rate and rhythm  without  murmurs or carotid bruit, and without distended neck veins. Respiratory: Lungs are clear to auscultation. Skin:  Without evidence of edema, or rash Trunk: BMI is  elevated and patient  has normal posture.  Neurologic exam : The patient is awake and alert, oriented to place and time.  Memory subjective  described as impaired- she feels after 3 years at the same job, she now makes mistakes she didn't make before.   There is a normal attention span & concentration ability.  Speech is fluent without dysarthria,  Monotonous voice. Not dysphonia or aphasia.  Mood and  affect are appropriate.  Cranial nerves: Pupils are equal and briskly reactive to light. Funduscopic exam without evidence of pallor or edema.  Extraocular movements  in vertical and horizontal planes intact and without nystagmus. Visual fields  by finger perimetry are intact. Hearing to finger rub intact.  Facial sensation intact to fine touch. Facial motor strength is symmetric and tongue and uvula move midline.  Motor exam:   Normal tone and  muscle bulk and symmetric strength in all extremities.  Sensory:  Fine touch, pinprick and vibration were tested in all extremities. Proprioception is  normal.  Coordination: Rapid alternating movements in the fingers/hands is tested and normal.  Finger-to-nose maneuver tested and normal without evidence of ataxia, dysmetria or tremor.  Gait and station: Patient walks without assistive device . She feels sore - Strength within normal limits. Stance is stable and normal. Steps are unfragmented. Romberg testing is normal.  Deep tendon reflexes: in the  upper and lower extremities are symmetric and intact. Babinski  downgoing.   Assessment:  After physical and neurologic examination, review of laboratory studies, imaging, neurophysiology testing and pre-existing records, assessment is :   1) Patient with diabetes , weight gain, retrognathia,  a history of fibromyalgia, and a one year history of snoring since she had full dentures for the upper jaw.   Plan:  Treatment plan and additional workup : 1) split study , AHI 15 and 3 % score, get supine sleep.  2) she has never been woken by a headache but wakes with a headache- need CO2.  Next visit in follow up from sleep study, will need MMSE and MOCA.

## 2013-12-30 ENCOUNTER — Ambulatory Visit (INDEPENDENT_AMBULATORY_CARE_PROVIDER_SITE_OTHER): Payer: 59 | Admitting: Family Medicine

## 2013-12-30 ENCOUNTER — Encounter: Payer: Self-pay | Admitting: Family Medicine

## 2013-12-30 VITALS — BP 115/80 | HR 90 | Temp 98.2°F | Resp 16 | Ht 63.0 in | Wt 165.0 lb

## 2013-12-30 DIAGNOSIS — E1165 Type 2 diabetes mellitus with hyperglycemia: Secondary | ICD-10-CM

## 2013-12-30 DIAGNOSIS — R4189 Other symptoms and signs involving cognitive functions and awareness: Secondary | ICD-10-CM

## 2013-12-30 DIAGNOSIS — F09 Unspecified mental disorder due to known physiological condition: Secondary | ICD-10-CM

## 2013-12-30 DIAGNOSIS — IMO0001 Reserved for inherently not codable concepts without codable children: Secondary | ICD-10-CM

## 2013-12-30 NOTE — Patient Instructions (Addendum)
I have ordered a HEAD CT without contrast. You will be contacted about the details of the imaging study.  You have a scheduled visit for Diabetes follow-up.  Work on taking your medications and healthy food choices.

## 2013-12-30 NOTE — Progress Notes (Signed)
S:  This 53 y.o. Cauc female returns for follow-up of MCI (mild cognitive impairment). Medications were adjusted at last visit; Lipitor was held and Sertraline dose was decreased. Pt states she is somewhat improves. She continues to make some mistakes but she is catching them. Sleeps like "a log". Medication compliance is good; she occasionally misses morning doses of DM meds. Vitamin D level was quite low; she is now taking a once a week supplement.  Last A1c= 8.7%.  Depression is stable despite medication dose reduction. [facility]   Patient Active Problem List   Diagnosis Date Noted  . Insomnia, idiopathic 12/20/2013  . Snoring 12/06/2013  . Tobacco use disorder 01/22/2013  . Essential hypertension, benign 10/26/2012  . Hyperlipidemia LDL goal < 100 10/26/2012  . Encounter for long-term (current) use of other medications 10/26/2012  . DM (diabetes mellitus) 10/13/2011  . Tremor 10/13/2011  . GERD (gastroesophageal reflux disease) 10/13/2011  . Depression 10/13/2011    Prior to Admission medications   Medication Sig Start Date End Date Taking? Authorizing Provider  aspirin 81 MG tablet Take 81 mg by mouth daily.   Yes Historical Provider, MD  ergocalciferol (DRISDOL) 50000 UNITS capsule Take 1 capsule (50,000 Units total) by mouth once a week. 12/05/13 12/05/14 Yes Maurice MarchBarbara B Kiarah Eckstein, MD  linagliptin (TRADJENTA) 5 MG TABS tablet Take 1 tablet (5 mg total) by mouth daily. 10/05/13  Yes Sherren MochaEva N Shaw, MD  losartan (COZAAR) 50 MG tablet Take 1 tablet (50 mg total) by mouth daily. 07/30/13  Yes Sherren MochaEva N Shaw, MD  metFORMIN (GLUCOPHAGE) 1000 MG tablet Take 1 tablet (1,000 mg total) by mouth 2 (two) times daily with a meal. 07/30/13  Yes Sherren MochaEva N Shaw, MD  primidone (MYSOLINE) 250 MG tablet Take 1 tablet (250 mg total) by mouth at bedtime. 07/30/13  Yes Sherren MochaEva N Shaw, MD  sertraline (ZOLOFT) 50 MG tablet Take 1 1/2 tablets at bedtime. 12/03/13  Yes Maurice MarchBarbara B Jade Burright, MD    PMHx, Surg Hx, Soc and Fam Hx reviewed.  ROS: As per HPI.  O: Filed Vitals:   12/30/13 0849  BP: 115/80  Pulse: 90  Temp: 98.2 F (36.8 C)  Resp: 16   GEN: In NAD; WN,WD. HENT: [place]/AT; EOMI w/ clear conj/ sclerae. Otherwise unremarkable. COR: RRR. LUNGS: Normal resp rate and effort. SKIN: W&D; intact w/o  Diaphoresis, erythema or pallor. MS: MAEs; no deformity or muscle atrophy. No c/c/e. NEURO: A&O x 3; CNs intact. Gait is normal. Nonfocal. PSYCH: Pleasant, calm and attentive. Affect is somewhat flat. Speech pattern is slowed; thought content is normal. Judgement is sound.  A/P: Cognitive impairment - Plan: CT Head Wo Contrast. Sleep disturbance evaluation in progress.  Type II or unspecified type diabetes mellitus without mention of complication, uncontrolled - Continue current medications. Diabetes follow-up as scheduled. Plan: CT Head Wo Contrast

## 2013-12-31 ENCOUNTER — Telehealth: Payer: Self-pay | Admitting: Neurology

## 2013-12-31 NOTE — Telephone Encounter (Signed)
Pt has not yet met her deductible and unable to afford her sleep study at this time.  She will postpone scheduling her appt at this time and follow up with the office once her deductible has been met.

## 2014-01-10 ENCOUNTER — Telehealth: Payer: Self-pay | Admitting: Family Medicine

## 2014-01-10 ENCOUNTER — Ambulatory Visit
Admission: RE | Admit: 2014-01-10 | Discharge: 2014-01-10 | Disposition: A | Discharge status: Home / Self Care | Payer: 59 | Source: Ambulatory Visit | Attending: Family Medicine | Admitting: Family Medicine

## 2014-01-10 DIAGNOSIS — R4189 Other symptoms and signs involving cognitive functions and awareness: Secondary | ICD-10-CM

## 2014-01-10 DIAGNOSIS — E1165 Type 2 diabetes mellitus with hyperglycemia: Secondary | ICD-10-CM

## 2014-01-10 DIAGNOSIS — IMO0001 Reserved for inherently not codable concepts without codable children: Secondary | ICD-10-CM

## 2014-01-10 NOTE — Telephone Encounter (Signed)
I phoned pt to discuss CT scan results; voicemail not set up. I could not leave a msg so I will try to contact her tomorrow. This call was made to her mobile phone.

## 2014-01-11 ENCOUNTER — Telehealth: Payer: Self-pay | Admitting: Family Medicine

## 2014-01-11 NOTE — Telephone Encounter (Signed)
CT Head result- chronic small vessel disease and very small area suggestive of stroke in the past. Pt states her cognitive function is better since reducing some of her medications. She suggest reducing Sertraline to 50 mg; she thinks a lot of her problems were related to medications. I agree w/ plan to reduce Sertraline to 1 tablet = 50 mg daily. Follow-up via phone in 2-3 weeks.

## 2014-01-25 ENCOUNTER — Telehealth: Payer: Self-pay

## 2014-01-25 ENCOUNTER — Telehealth: Payer: Self-pay | Admitting: Family Medicine

## 2014-01-25 NOTE — Telephone Encounter (Signed)
Dr. Audria NineMcPherson, pt called back to say that she seems to be doing better on the decreased dose of Zoloft. I let her know that you would try to call her again later on this week

## 2014-01-25 NOTE — Telephone Encounter (Signed)
I was phoning pt to follow-up w/ her since she has decreased Sertraline to 50 mg daily; I will try to contact her within the next week. She reduced Sertraline to 50 mg to improve cognition 2 weeks ago.

## 2014-01-26 NOTE — Telephone Encounter (Signed)
That is what I needed to know. She has an appt w/ Dr. Clelia CroftShaw in June.

## 2014-02-07 ENCOUNTER — Other Ambulatory Visit: Payer: Self-pay | Admitting: Family Medicine

## 2014-03-04 ENCOUNTER — Ambulatory Visit: Payer: 59 | Admitting: Family Medicine

## 2014-03-11 ENCOUNTER — Encounter: Payer: Self-pay | Admitting: Family Medicine

## 2014-03-11 ENCOUNTER — Ambulatory Visit (INDEPENDENT_AMBULATORY_CARE_PROVIDER_SITE_OTHER): Payer: 59 | Admitting: Family Medicine

## 2014-03-11 VITALS — BP 138/86 | HR 85 | Temp 98.7°F | Resp 16 | Ht 63.5 in | Wt 160.0 lb

## 2014-03-11 DIAGNOSIS — R259 Unspecified abnormal involuntary movements: Secondary | ICD-10-CM

## 2014-03-11 DIAGNOSIS — IMO0001 Reserved for inherently not codable concepts without codable children: Secondary | ICD-10-CM

## 2014-03-11 DIAGNOSIS — E785 Hyperlipidemia, unspecified: Secondary | ICD-10-CM

## 2014-03-11 DIAGNOSIS — R251 Tremor, unspecified: Secondary | ICD-10-CM

## 2014-03-11 DIAGNOSIS — Z79899 Other long term (current) drug therapy: Secondary | ICD-10-CM

## 2014-03-11 DIAGNOSIS — F17200 Nicotine dependence, unspecified, uncomplicated: Secondary | ICD-10-CM

## 2014-03-11 DIAGNOSIS — I679 Cerebrovascular disease, unspecified: Secondary | ICD-10-CM | POA: Insufficient documentation

## 2014-03-11 DIAGNOSIS — I10 Essential (primary) hypertension: Secondary | ICD-10-CM

## 2014-03-11 DIAGNOSIS — E1165 Type 2 diabetes mellitus with hyperglycemia: Principal | ICD-10-CM

## 2014-03-11 DIAGNOSIS — E1149 Type 2 diabetes mellitus with other diabetic neurological complication: Secondary | ICD-10-CM

## 2014-03-11 DIAGNOSIS — I6381 Other cerebral infarction due to occlusion or stenosis of small artery: Secondary | ICD-10-CM | POA: Insufficient documentation

## 2014-03-11 DIAGNOSIS — I6350 Cerebral infarction due to unspecified occlusion or stenosis of unspecified cerebral artery: Secondary | ICD-10-CM

## 2014-03-11 LAB — COMPREHENSIVE METABOLIC PANEL
ALBUMIN: 4.2 g/dL (ref 3.5–5.2)
ALT: 19 U/L (ref 0–35)
AST: 15 U/L (ref 0–37)
Alkaline Phosphatase: 69 U/L (ref 39–117)
BUN: 8 mg/dL (ref 6–23)
CO2: 26 mEq/L (ref 19–32)
Calcium: 9.6 mg/dL (ref 8.4–10.5)
Chloride: 106 mEq/L (ref 96–112)
Creat: 0.58 mg/dL (ref 0.50–1.10)
Glucose, Bld: 130 mg/dL — ABNORMAL HIGH (ref 70–99)
POTASSIUM: 4.3 meq/L (ref 3.5–5.3)
Sodium: 142 mEq/L (ref 135–145)
Total Bilirubin: 0.2 mg/dL (ref 0.2–1.2)
Total Protein: 6.5 g/dL (ref 6.0–8.3)

## 2014-03-11 LAB — POCT GLYCOSYLATED HEMOGLOBIN (HGB A1C): HEMOGLOBIN A1C: 6.6

## 2014-03-11 LAB — LIPID PANEL
Cholesterol: 196 mg/dL (ref 0–200)
HDL: 45 mg/dL (ref >39)
LDL CALC: 131 mg/dL — AB (ref 0–99)
Total CHOL/HDL Ratio: 4.4 Ratio
Triglycerides: 99 mg/dL (ref <150)
VLDL: 20 mg/dL (ref 0–40)

## 2014-03-11 MED ORDER — LINAGLIPTIN 5 MG PO TABS: 5.0000 mg | ORAL_TABLET | Freq: Every day | ORAL | Status: DC

## 2014-03-11 NOTE — Progress Notes (Addendum)
Subjective:  This chart was scribed for Levell JulyEva N. Clelia CroftShaw, MD  by Ashley JacobsBrittany Andrews, [facility] Scribe. The patient was seen in room 23 and the patient's care was started at 10:48 AM.   Patient ID: Brittany [name], female    DOB: [birth year], 53 y.o.   MRN: [id number] Chief Complaint  Patient presents with  . Follow-up    DM - 3 mth check - Pt is fasting    HPI HPI Comments: Brittany [name] is a 53 y.o. female who arrives to the [facility] for a three month follow up of her DM. Pt is fasting. Over the past several month she was under evaluaiton for mild memory lapses that was affecting her work. She was referred to neurology and was recommeded to have a sleep test for sleep anea as well as monitoring of her oxygen saturation levels overnight. She did not follow up with the sleep specialist.  Denies sleep disturbance and often feels well rested in the morning. A head CT three months ago confirmed small vessle ischemic disease and a hx of a small stroke. She was taken off her Lipitor and her Zoloft dosage was decreased which seemed to help.  She was possibly low on Vitamin D and treatment was started.  Her last hemoglobin a1c was 8.7 five months ago and is 6.6 today.   Pt complains of a painful nodule in her right axilla. She explains that she pressed the site and it had pustule drainage.   She stopped attemping smoking cessation the day of her father's MI.  Pt mentions that she feels more stressed due to her father's MI.  Denies being overly stressed. She is his primary caretaker. Pt is his only child.  Her last visit to the ophthalmologist  was earlier this year.   Past Medical History  Diagnosis Date  . Diabetes mellitus without complication    Current Outpatient Prescriptions on File Prior to Visit  Medication Sig Dispense Refill  . aspirin 81 MG tablet Take 81 mg by mouth daily.      . ergocalciferol (DRISDOL) 50000 UNITS capsule Take 1 capsule  (50,000 Units total) by mouth once a week.  4 capsule  5  . losartan (COZAAR) 50 MG tablet Take 1 tablet (50 mg total) by mouth daily.  90 tablet  3  . metFORMIN (GLUCOPHAGE) 1000 MG tablet Take 1 tablet (1,000 mg total) by mouth 2 (two) times daily with a meal.  180 tablet  3  . primidone (MYSOLINE) 250 MG tablet Take 1 tablet (250 mg total) by mouth at bedtime.  90 tablet  3  . sertraline (ZOLOFT) 50 MG tablet TAKE 1 AND 1/2 TABLET(S) BY MOUTH EVERY NIGHT AT BEDTIME  45 tablet  4   No current facility-administered medications on file prior to visit.   No Known Allergies    Review of Systems  Constitutional: Negative for activity change, appetite change, fatigue and unexpected weight change.  Musculoskeletal: Positive for arthralgias.  Skin: Positive for color change and rash. Negative for wound.  Neurological: Positive for tremors. Negative for seizures, syncope, facial asymmetry, speech difficulty, weakness and numbness.  Hematological: Positive for adenopathy.  Psychiatric/Behavioral: Positive for confusion and decreased concentration. Negative for sleep disturbance and dysphoric mood. The patient is not nervous/anxious.   All other systems reviewed and are negative.      Objective:   Physical Exam  Nursing note and vitals reviewed. Constitutional: She is oriented to  person, place, and time. She appears well-developed and well-nourished. No distress.  HENT:  Head: Normocephalic and atraumatic.  Eyes: Conjunctivae and EOM are normal.  Neck: Neck supple. No tracheal deviation present.  Cardiovascular: Normal rate, regular rhythm and normal heart sounds.   Pulmonary/Chest: Effort normal and breath sounds normal. No respiratory distress. She has no wheezes. She has no rales.  Musculoskeletal: Normal range of motion.  Neurological: She is alert and oriented to person, place, and time.  Skin: Skin is warm and dry.  Psychiatric: She has a normal mood and affect. Her behavior is  normal.   Filed Vitals:   03/11/14 1011  BP: 138/86  Pulse: 85  Temp: 98.7 F (37.1 C)  TempSrc: Oral  Resp: 16  Height: 5' 3.5" (1.613 m)  Weight: 160 lb (72.576 kg)  SpO2: 96%  DIAGNOSTIC STUDIES: Oxygen Saturation is 96% on room air, normal by my interpretation.    COORDINATION OF CARE:  10:56 AM Discussed course of care with pt which includes vitamin d refill and possible multivitamin . Advised pt to follow up with a sleep specialist/neurologist when possible. Reccommended a calcium vitamin d supplement. I told pt to apply warm compresses to her infected hair follicles in her axilla and to return if site does not resolve on its own. Pt understands and agrees.          Assessment & Plan:   Type II or unspecified type diabetes mellitus without mention of complication, uncontrolled - Plan: POCT glycosylated hemoglobin (Hb A1C), Lipid panel, Comprehensive metabolic panel, Vit D  25 hydroxy (rtn osteoporosis monitoring) - much improved and now at goal! Cont current meds.  Essential hypertension, benign - Plan: POCT glycosylated hemoglobin (Hb A1C), Lipid panel, Comprehensive metabolic panel, Vit D  25 hydroxy (rtn osteoporosis monitoring)  Tremor  Tobacco use disorder  Hyperlipidemia LDL goal <70 - suspect that statin was exacerbating her memory loss as doing better now. However, now her goal LDL is even lower consider brain MRI showing prior infarcts. Clinically she feels better off of the statin so ok to maintain for a while - but may want to f/u w/ neurology to discuss risks/benefits on statin therapy on memory.  However, memory prob could also have been from ssri or low vit D so rec pt try pravastatin 40 (instead of atorvastatin) as LDL did increase from 50s to 130s off of statin.  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 linagliptin (TRADJENTA) 5 MG TABS tablet    Sig: Take 1 tablet (5 mg total) by mouth daily.    Dispense:  90 tablet    Refill:  1    I personally performed the services described in this documentation, which was scribed in my presence. The recorded information has been reviewed and considered, and addended by me as needed.  Norberto SorensonEva Akeria Hedstrom, MD MPH

## 2014-03-12 LAB — VITAMIN D 25 HYDROXY (VIT D DEFICIENCY, FRACTURES): Vit D, 25-Hydroxy: 47 ng/mL (ref 30–89)

## 2014-03-27 ENCOUNTER — Encounter: Payer: Self-pay | Admitting: Family Medicine

## 2014-03-27 MED ORDER — PRAVASTATIN SODIUM 40 MG PO TABS: 40.0000 mg | ORAL_TABLET | Freq: Every day | ORAL | Status: DC

## 2014-04-07 ENCOUNTER — Telehealth: Payer: Self-pay

## 2014-04-07 DIAGNOSIS — F17200 Nicotine dependence, unspecified, uncomplicated: Secondary | ICD-10-CM

## 2014-04-07 MED ORDER — VARENICLINE TARTRATE 0.5 MG X 11 & 1 MG X 42 PO MISC: ORAL | Status: DC

## 2014-04-07 NOTE — Telephone Encounter (Signed)
Per dr Clelia Croftshaw, ok to fill. Left detailed message on machine, what to expect with medication and when to stop, per dr Clelia Croftshaw.

## 2014-04-07 NOTE — Telephone Encounter (Signed)
Dr Clelia CroftShaw   Patient is "finally ready to follow your orders and quit smoking".   She would like a chantix script to [facility] [address]   Okay to leave message on secure/personal work number  [phone]

## 2014-04-08 ENCOUNTER — Telehealth: Payer: Self-pay

## 2014-04-08 NOTE — Telephone Encounter (Signed)
PA needed for Chantix. Called pt who reported she has tried/failed bupropion, Nicoderm, Nicorette gum and lozenges in the past. She tried Chantix about 2 years ago and it worked the best. Completed form sent by [facility] and faxed in.Pending.

## 2014-04-12 NOTE — Telephone Encounter (Signed)
PA approved through 04/08/15. Notified pharm.

## 2014-05-23 ENCOUNTER — Other Ambulatory Visit: Payer: Self-pay | Admitting: Family Medicine

## 2014-06-25 ENCOUNTER — Other Ambulatory Visit: Payer: Self-pay | Admitting: Family Medicine

## 2014-07-15 ENCOUNTER — Ambulatory Visit (INDEPENDENT_AMBULATORY_CARE_PROVIDER_SITE_OTHER): Payer: 59 | Admitting: Family Medicine

## 2014-07-15 ENCOUNTER — Encounter: Payer: Self-pay | Admitting: Family Medicine

## 2014-07-15 VITALS — BP 120/80 | HR 90 | Temp 98.4°F | Resp 16 | Ht 63.5 in | Wt 157.2 lb

## 2014-07-15 DIAGNOSIS — E1165 Type 2 diabetes mellitus with hyperglycemia: Secondary | ICD-10-CM

## 2014-07-15 DIAGNOSIS — IMO0002 Reserved for concepts with insufficient information to code with codable children: Secondary | ICD-10-CM

## 2014-07-15 DIAGNOSIS — F17200 Nicotine dependence, unspecified, uncomplicated: Secondary | ICD-10-CM

## 2014-07-15 DIAGNOSIS — I10 Essential (primary) hypertension: Secondary | ICD-10-CM

## 2014-07-15 DIAGNOSIS — Z23 Encounter for immunization: Secondary | ICD-10-CM

## 2014-07-15 DIAGNOSIS — E785 Hyperlipidemia, unspecified: Secondary | ICD-10-CM

## 2014-07-15 LAB — COMPLETE METABOLIC PANEL WITH GFR
ALK PHOS: 93 U/L (ref 39–117)
ALT: 14 U/L (ref 0–35)
AST: 12 U/L (ref 0–37)
Albumin: 4.4 g/dL (ref 3.5–5.2)
BILIRUBIN TOTAL: 0.4 mg/dL (ref 0.2–1.2)
BUN: 11 mg/dL (ref 6–23)
CO2: 24 mEq/L (ref 19–32)
Calcium: 9.6 mg/dL (ref 8.4–10.5)
Chloride: 103 mEq/L (ref 96–112)
Creat: 0.56 mg/dL (ref 0.50–1.10)
GFR, Est African American: >89 mL/min
GFR, Est Non African American: >89 mL/min
GLUCOSE: 148 mg/dL — AB (ref 70–99)
Potassium: 4.5 mEq/L (ref 3.5–5.3)
SODIUM: 140 meq/L (ref 135–145)
TOTAL PROTEIN: 6.9 g/dL (ref 6.0–8.3)

## 2014-07-15 LAB — LIPID PANEL
CHOL/HDL RATIO: 2.6 ratio
Cholesterol: 138 mg/dL (ref 0–200)
HDL: 54 mg/dL (ref >39)
LDL Cholesterol: 68 mg/dL (ref 0–99)
Triglycerides: 82 mg/dL (ref <150)
VLDL: 16 mg/dL (ref 0–40)

## 2014-07-15 LAB — POCT GLYCOSYLATED HEMOGLOBIN (HGB A1C): Hemoglobin A1C: 6.8

## 2014-07-15 LAB — MICROALBUMIN, URINE: MICROALB UR: 1.1 mg/dL (ref <2.0)

## 2014-07-15 LAB — GLUCOSE, POCT (MANUAL RESULT ENTRY): POC Glucose: 156 mg/dl — AB (ref 70–99)

## 2014-07-15 NOTE — Progress Notes (Addendum)
Subjective:  This chart was scribed for Norberto SorensonEva Shaw, MD by Carl Bestelina Holson, Medical Scribe. This patient was seen in Room 23 and the patient's care was started at 9:40 AM.    Patient ID: Brittany [name], female    DOB: 10-20-1960, 53 y.o.   MRN: [id number]   Chief Complaint  Patient presents with  . Follow-up    DM   HPI HPI Comments: Brittany CampusDenise W [name] is a 53 y.o. female who presents to the [facility] for a 4 month DM follow-up.  She quit smoking for 28 days and started smoking again after she started stressing out about her husband and boss.  She would like to retry the Chantix.  She has been taking Pravastatin regularly.  Her mind has been a little foggy recently but she is not sure if this is due to the Pravastatin or stress of taking care of her family.  She does not take fish oil.  She has not been checking her sugar levels regularly but has not experienced any symptoms synonymous with hypoglycemia.  Her feet are doing well.  She saw her ophthalmologist this year.    Hemoglobin A1C 4 months prior was 6.6.  She had a normal CMP and Vitamin D at that visit but her cholesterol returned with an LDL of 131 where her goal LDL is less than 70 due to DM and ischemic brain disease.  She was on Atorvastatin but stopped it as she felt that it caused memory problems so I recommended that she try Pravastatin and if she was still having issues with this, consider a neurology referral.  When patient was on Atorvastatin, her LDL did decrease to 50.  Patient did call 3 months ago and asked for a prescription for Chantix to help her quit smoking as she had failed Wellbutrin, Nicoderm patches, and Nicorette gum and lozenges.  She did well on Chantix previously.     Past Medical History  Diagnosis Date  . Diabetes mellitus without complication    Past Surgical History  Procedure Laterality Date  . Cholecystectomy    . Spine surgery     Family History  Problem Relation Age of Onset  .  Cancer Mother   . Diabetes Mother   . Heart disease Mother   . Diabetes Father   . Heart disease Father   . Cancer Maternal Grandmother 62    lung  . COPD Maternal Aunt    History   Social History  . Marital Status: Married    Spouse Name: [name]    Number of Children: 2  . Years of Education: 14   Occupational History  . Not on file.   Social History Main Topics  . Smoking status: Current Every Day Smoker -- 1.00 packs/day for 2 years    Types: Cigarettes  . Smokeless tobacco: Never Used  . Alcohol Use: Yes     Comment: rarely  . Drug Use: No  . Sexual Activity: Not on file   Other Topics Concern  . Not on file   Social History Narrative   Patient is married ([name]) and lives at home with her family   Patient has two children.   Patient works at [employer].   Patient has a college education.   Patient is right-handed.   Patient drinks four cups of coffee M-F.         No Known Allergies  Review of Systems  Constitutional: Positive for fatigue. Negative  for fever, chills, diaphoresis and appetite change.  Eyes: Negative for visual disturbance.  Respiratory: Negative for cough and shortness of breath.   Cardiovascular: Negative for chest pain, palpitations and leg swelling.  Genitourinary: Negative for decreased urine volume.  Neurological: Negative for syncope, weakness, numbness and headaches.  Hematological: Does not bruise/bleed easily.  Psychiatric/Behavioral: Positive for decreased concentration. Negative for confusion.     Objective:  BP 120/80 mmHg  Pulse 90  Temp(Src) 98.4 F (36.9 C) (Oral)  Resp 16  Ht 5' 3.5" (1.613 m)  Wt 157 lb 3.2 oz (71.305 kg)  BMI 27.41 kg/m2  SpO2 96%  Physical Exam  Constitutional: She is oriented to person, place, and time. She appears well-developed and well-nourished.  HENT:  Head: Normocephalic and atraumatic.  Eyes: EOM are normal.  Neck: Normal range of motion. Neck supple. No thyromegaly present.    Cardiovascular: Normal rate, regular rhythm, S1 normal, S2 normal and normal heart sounds.   No murmur heard. Pulmonary/Chest: Effort normal and breath sounds normal. No respiratory distress. She has no wheezes. She has no rales.  Musculoskeletal: Normal range of motion.  Lymphadenopathy:    She has no cervical adenopathy.  Neurological: She is alert and oriented to person, place, and time.  Skin: Skin is warm and dry.  Psychiatric: She has a normal mood and affect. Her behavior is normal.  Nursing note and vitals reviewed.   Results for orders placed or performed in visit on 07/15/14  POCT glucose (manual entry)  Result Value Ref Range   POC Glucose 156 (A) 70 - 99 mg/dl  POCT glycosylated hemoglobin (Hb A1C)  Result Value Ref Range   Hemoglobin A1C 6.8      Assessment & Plan:  Patient given Pneumovax.    Type II diabetes mellitus, uncontrolled - Plan: HM Diabetes Foot Exam, POCT glucose (manual entry), POCT glycosylated hemoglobin (Hb A1C), COMPLETE METABOLIC PANEL WITH GFR, Microalbumin, urine - cont current meds and work on tlc  Hyperlipidemia LDL goal <70 - Plan: Lipid panel - Start fish oil supplement.  LDL well controlled, cont current regimen  Essential hypertension, benign - Plan: COMPLETE METABOLIC PANEL WITH GFR  Vit D deficiency - on high dose replacement - check level at next OV  Tobacco use - retry chantix since worked prev    I personally performed the services described in this documentation, which was scribed in my presence. The recorded information has been reviewed and considered, and addended by me as needed.  Norberto Sorenson, MD MPH  Results for orders placed or performed in visit on 07/15/14  COMPLETE METABOLIC PANEL WITH GFR  Result Value Ref Range   Sodium 140 135 - 145 mEq/L   Potassium 4.5 3.5 - 5.3 mEq/L   Chloride 103 96 - 112 mEq/L   CO2 24 19 - 32 mEq/L   Glucose, Bld 148 (H) 70 - 99 mg/dL   BUN 11 6 - 23 mg/dL   Creat 1.61 0.96 - 0.45 mg/dL    Total Bilirubin 0.4 0.2 - 1.2 mg/dL   Alkaline Phosphatase 93 39 - 117 U/L   AST 12 0 - 37 U/L   ALT 14 0 - 35 U/L   Total Protein 6.9 6.0 - 8.3 g/dL   Albumin 4.4 3.5 - 5.2 g/dL   Calcium 9.6 8.4 - 40.9 mg/dL   GFR, Est African American >89 mL/min   GFR, Est Non African American >89 mL/min  Lipid panel  Result Value Ref Range   Cholesterol  138 0 - 200 mg/dL   Triglycerides 82 <161<150 mg/dL   HDL 54 >09>39 mg/dL   Total CHOL/HDL Ratio 2.6 Ratio   VLDL 16 0 - 40 mg/dL   LDL Cholesterol 68 0 - 99 mg/dL  Microalbumin, urine  Result Value Ref Range   Microalb, Ur 1.1 <2.0 mg/dL  POCT glucose (manual entry)  Result Value Ref Range   POC Glucose 156 (A) 70 - 99 mg/dl  POCT glycosylated hemoglobin (Hb A1C)  Result Value Ref Range   Hemoglobin A1C 6.8

## 2014-07-27 ENCOUNTER — Encounter: Payer: Self-pay | Admitting: Family Medicine

## 2014-07-27 MED ORDER — SERTRALINE HCL 50 MG PO TABS: 75.0000 mg | ORAL_TABLET | Freq: Every day | ORAL | Status: DC

## 2014-07-27 MED ORDER — PRAVASTATIN SODIUM 40 MG PO TABS: 40.0000 mg | ORAL_TABLET | Freq: Every day | ORAL | Status: DC

## 2014-07-27 MED ORDER — LOSARTAN POTASSIUM 50 MG PO TABS: 50.0000 mg | ORAL_TABLET | Freq: Every day | ORAL | Status: DC

## 2014-07-27 MED ORDER — LINAGLIPTIN 5 MG PO TABS: 5.0000 mg | ORAL_TABLET | Freq: Every day | ORAL | Status: DC

## 2014-07-27 MED ORDER — PRIMIDONE 250 MG PO TABS: 250.0000 mg | ORAL_TABLET | Freq: Every day | ORAL | Status: DC

## 2014-07-27 MED ORDER — METFORMIN HCL 1000 MG PO TABS: 1000.0000 mg | ORAL_TABLET | Freq: Two times a day (BID) | ORAL | Status: DC

## 2014-07-27 MED ORDER — VARENICLINE TARTRATE 0.5 MG X 11 & 1 MG X 42 PO MISC: ORAL | Status: DC

## 2014-07-27 NOTE — Addendum Note (Signed)
Addended by: Norberto SorensonSHAW, EVA on: 07/27/2014 10:21 AM   Modules accepted: Orders

## 2014-09-03 ENCOUNTER — Other Ambulatory Visit: Payer: Self-pay | Admitting: Family Medicine

## 2014-09-12 ENCOUNTER — Other Ambulatory Visit: Payer: Self-pay | Admitting: Family Medicine

## 2014-10-14 ENCOUNTER — Ambulatory Visit (INDEPENDENT_AMBULATORY_CARE_PROVIDER_SITE_OTHER): Payer: 59 | Admitting: Family Medicine

## 2014-10-14 ENCOUNTER — Encounter: Payer: Self-pay | Admitting: Family Medicine

## 2014-10-14 VITALS — BP 120/74 | HR 99 | Temp 98.3°F | Resp 16 | Ht 64.0 in | Wt 164.0 lb

## 2014-10-14 DIAGNOSIS — F329 Major depressive disorder, single episode, unspecified: Secondary | ICD-10-CM

## 2014-10-14 DIAGNOSIS — F17200 Nicotine dependence, unspecified, uncomplicated: Secondary | ICD-10-CM

## 2014-10-14 DIAGNOSIS — E1165 Type 2 diabetes mellitus with hyperglycemia: Secondary | ICD-10-CM

## 2014-10-14 DIAGNOSIS — F4321 Adjustment disorder with depressed mood: Secondary | ICD-10-CM

## 2014-10-14 DIAGNOSIS — IMO0002 Reserved for concepts with insufficient information to code with codable children: Secondary | ICD-10-CM

## 2014-10-14 DIAGNOSIS — Z720 Tobacco use: Secondary | ICD-10-CM

## 2014-10-14 DIAGNOSIS — F32A Depression, unspecified: Secondary | ICD-10-CM

## 2014-10-14 LAB — POCT GLYCOSYLATED HEMOGLOBIN (HGB A1C): HEMOGLOBIN A1C: 7.6

## 2014-10-14 LAB — GLUCOSE, POCT (MANUAL RESULT ENTRY): POC Glucose: 197 mg/dl — AB (ref 70–99)

## 2014-10-14 MED ORDER — SERTRALINE HCL 100 MG PO TABS: 100.0000 mg | ORAL_TABLET | Freq: Every day | ORAL | Status: DC

## 2014-10-14 MED ORDER — LIRAGLUTIDE 18 MG/3ML ~~LOC~~ SOPN: PEN_INJECTOR | SUBCUTANEOUS | Status: DC

## 2014-10-14 NOTE — Patient Instructions (Signed)
We will check your FASTING labs at your next visit in 3-4 months.  Lets stop your tradjenta and try you on victoza.  Start getting some exercise so that hopefully with the medicine you can start seeing some weightloss.  If you could somehow work in into your day to go on a walk several times/wk I think it would help EVERYTHING (sugars, weight, mood, smoking) immensely. Increase your zoloft to 100mg  daily. Look into your EAP program at work since a lot of your mood problems are due to work stressors.  Complementary and Alternative Medical Therapies for Diabetes Complementary and alternative medicines are health care practices or products that are not always accepted as part of routine medicine. Complementary medicine is used along with routine medicine (medical therapy). Alternative medicine can sometimes be used instead of routine medicine. Some people use these methods to treat diabetes. While some of these therapies may be effective, others may not be. Some may even be harmful. Patients using these methods need to tell their caregiver. It is important to let your caregivers know what you are doing. Some of these therapies are discussed below. For more information, talk with your caregiver. THERAPIES Acupuncture Acupuncture is done by a professional who inserts needles into certain points on the skin. Some scientists believe that this triggers the release of the body's natural painkillers. It has been shown to relieve long-term (chronic) pain. This may help patients with painful nerve damage caused by diabetes. Biofeedback Biofeedback helps a person become more aware of the body's response to pain. It also helps you learn to deal with the pain. This alternative therapy focuses on relaxation and stress-reduction techniques. Thinking of peaceful mental images (guided imagery) is one technique. Some people believe these images can ease their condition. MEDICATIONS Chromium Several studies report that  chromium supplements may improve diabetes control. Chromium helps insulin improve its action. Research is not yet certain. Supplements have not been recommended or approved. Caution is needed if you have kidney (renal) problems. Ginseng There are several types of ginseng plants. American ginseng is used for diabetes studies. Those studies have shown some glucose-lowering effects. Those effects have been seen with fasting and after-meal blood glucose levels. They have also been seen in A1c levels (average blood glucose levels over a 7453-month period). More long-term studies are needed before recommendations for use of ginseng can be made. Magnesium Experts have studied the relationship between magnesium and diabetes for many years. But it is not yet fully understood. Studies suggest that a low amount of magnesium may make blood glucose control worse in type 2 diabetes. Research also shows that a low amount may contribute to certain diabetes complications. One study showed that people who consume more magnesium had less risk of type 2 diabetes. Eating whole grains, nuts, and green leafy vegetables raises the magnesium level. Vanadium Vanadium is a compound found in tiny amounts in plants and animals. Early studies showed that vanadium improved blood glucose levels in animals with type 1 and type 2 diabetes. One study found that when given vanadium, those with diabetes were able to decrease their insulin dosage. Researchers still need to learn how it works in the body to discover any side effects, and to find safe dosages. Cinnamon There have been a couple of studies that seem to indicate cinnamon decreases insulin resistance and increases insulin production. By doing so, it may lower blood glucose. Exact doses are unknown, but it may work best when used in combination with other diabetes medicines.  Document Released: 06/23/2007 Document Revised: 11/18/2011 Document Reviewed: 07/06/2009 [facility] Patient  Information 2015 [place], [place]. This information is not intended to replace advice given to you by your health care provider. Make sure you discuss any questions you have with your health care provider.

## 2014-10-14 NOTE — Progress Notes (Signed)
Subjective:    Patient ID: Brittany [name], female    DOB: [birth year], 54 y.o.   MRN: [id number]  HPI Patient presents for diabetes follow up and has been overwhelmed and stressed out in the interim.   She has not been following any particular diet and eats out regularly. When thirsty is drinking sweet tea and soda with minimal water consumption. She has not done any exercise since the weather has been cold. She has been compliant with all medications and does not need refills at this time. Medications are well tolerated. She does not check glucose at home at this time. Denies SOB, CP, edema, or HA at this time.  Was trying to quit smoking initially, but cannot at this time as she feels like her "nerves are shot". 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 She has brought it to the attention of management, but that has not helped. Stress level is increase as new assignments are brought to her as opposed to the other workers. She additionally is taking care of her elderly parents which has been a strain. Her two adult sons are living at home as well and they have not helped out despite extensively prompting. She is feeling overwhelmed and that sertraline is not strong enough. Additionally, feels that between most years from about October to March, her mood is depressed even more than usual. Most relatives have died during this time of year and she attributed other negative occurrence in life to this time of year. Would like increase in sertraline temporarily.    Health Maintence: Flu vaccine-UTD; Eye exam- UTD.  Review of Systems  Constitutional: Negative for fever and fatigue.  Respiratory: Negative for cough and shortness of breath.   Cardiovascular: Negative for chest pain, palpitations and leg swelling.  Gastrointestinal: Negative for nausea, vomiting, abdominal pain  and diarrhea.  Endocrine: Negative.   Neurological: Negative for dizziness, light-headedness and headaches.  Psychiatric/Behavioral: Positive for dysphoric mood. Negative for suicidal ideas, confusion, sleep disturbance and agitation. The patient is not nervous/anxious and is not hyperactive.        Objective:   Physical Exam  Constitutional: She is oriented to person, place, and time. She appears well-developed and well-nourished. No distress.  Blood pressure 120/74, pulse 99, temperature 98.3 F (36.8 C), temperature source Oral, resp. rate 16, height  (1.626 m), weight 164 lb (74.39 kg), SpO2 98 %.  HENT:  Head: Normocephalic and atraumatic.  Right Ear: External ear normal.  Left Ear: External ear normal.  Nose: Nose normal.  Mouth/Throat: Oropharynx is clear and moist. No oropharyngeal exudate.  Eyes: Conjunctivae are normal. Pupils are equal, round, and reactive to light. Right eye exhibits no discharge. Left eye exhibits no discharge. No scleral icterus.  Neck: Normal range of motion. Neck supple. No thyromegaly present.  Cardiovascular: Normal rate, regular rhythm, normal heart sounds and intact distal pulses.  Exam reveals no gallop and no friction rub.   No murmur heard. Pulmonary/Chest: Effort normal and breath sounds normal. No respiratory distress. She has no wheezes. She has no rales.  Abdominal: Soft. Bowel sounds are normal. She exhibits no distension.  Musculoskeletal: She exhibits no edema.  Lymphadenopathy:    She has no cervical adenopathy.  Neurological: She is alert and oriented to person, place, and time.  Skin: Skin is warm and dry. No rash  noted. She is not diaphoretic. No erythema. No pallor.  Psychiatric: Her speech is normal. Judgment and thought content normal. She exhibits a depressed mood.  Becomes tearful when discussing stressors and depression.   Diabetic Foot Exam - Simple   Simple Foot Form  Diabetic Foot exam was performed with the following  findings:  Yes 10/14/2014  8:21 AM  Visual Inspection  Sensation Testing  Pulse Check  Comments      Results for orders placed or performed in visit on 10/14/14  POCT glycosylated hemoglobin (Hb A1C)  Result Value Ref Range   Hemoglobin A1C 7.6   POCT glucose (manual entry)  Result Value Ref Range   POC Glucose 197 (A) 70 - 99 mg/dl   Wt Readings from Last 3 Encounters:  10/14/14 164 lb (74.39 kg)  07/15/14 157 lb 3.2 oz (71.305 kg)  03/11/14 160 lb (72.576 kg)      Assessment & Plan:  1. Type II diabetes mellitus, uncontrolled HA1C getting worse. Discontinue Tradjenta 5 mg. Add Victoza 18 mg/ml. Continue Metformin 1000 mg. Lifestyle modifications discussed.  - POCT glycosylated hemoglobin (Hb A1C) - HM Diabetes Foot Exam - POCT glucose (manual entry)  2. Adjustment disorder with depressed mood 3. Depression Increase sertraline from 50 mg to 100 mg temporarily. Will like decrease back to 50 once stress level has improved and season has changed and winter seems to be attributing to depressed mood.  4. Tobacco use disorder Not ready to quit at this time. Will re-assess when stress level has improved.    Janan Ridgeishira Chinyere Galiano PA-C  [facility] 10/14/2014 10:50 AM

## 2014-10-18 ENCOUNTER — Other Ambulatory Visit: Payer: Self-pay | Admitting: Family Medicine

## 2014-10-19 NOTE — Progress Notes (Signed)
Subjective:    Patient ID: Brittany [name], female    DOB: [name], 54 y.o.   MRN: [id number]   Chief Complaint  Patient presents with  . Follow-up    DIABETES    HPI  Brittany [name] has been under a lot of stress at work - coworkers and bosses aren't accountable or helping.  Also gets worse mood over winter anyway so has been smoking more than usual.  Has seen therapists in the past for chronic recurrent depression without any sig benefit. . Past Medical History  Diagnosis Date  . Diabetes mellitus without complication   . Depression    Current Outpatient Prescriptions on File Prior to Visit  Medication Sig Dispense Refill  . losartan (COZAAR) 50 MG tablet Take 1 tablet (50 mg total) by mouth daily. 90 tablet 3  . metFORMIN (GLUCOPHAGE) 1000 MG tablet Take 1 tablet (1,000 mg total) by mouth 2 (two) times daily with a meal. 180 tablet 3  . pravastatin (PRAVACHOL) 40 MG tablet Take 1 tablet (40 mg total) by mouth daily. 90 tablet 3  . primidone (MYSOLINE) 250 MG tablet Take 1 tablet (250 mg total) by mouth at bedtime. 90 tablet 3  . Vitamin D, Ergocalciferol, (DRISDOL) 50000 UNITS CAPS capsule TAKE 1 CAPSULE (50,000 UNITS TOTAL) BY MOUTH ONCE A WEEK. 4 capsule 4  . aspirin 81 MG tablet Take 81 mg by mouth daily.    . varenicline (CHANTIX STARTING MONTH PAK) 0.5 MG X 11 & 1 MG X 42 tablet Take 0.5 mg tab po qd x 3d, then increase to 0.5 mg tab bid x 4 d, then increase to 1 mg tablet bid. (Patient not taking: Reported on 10/14/2014) 56 tablet 0   No current facility-administered medications on file prior to visit.   No Known Allergies   Review of Systems  Constitutional: Positive for fatigue and unexpected weight change. Negative for fever, chills, diaphoresis, activity change and appetite change.  Eyes: Negative for visual disturbance.  Respiratory: Negative for cough and shortness of breath.   Cardiovascular: Negative for chest pain, palpitations and leg swelling.  Genitourinary:  Negative for decreased urine volume.  Neurological: Negative for syncope and headaches.  Hematological: Does not bruise/bleed easily.  Psychiatric/Behavioral: Positive for dysphoric mood. The patient is not nervous/anxious.        Objective:   Physical Exam  Constitutional: She is oriented to person, place, and time. She appears well-developed and well-nourished. No distress.  HENT:  Head: Normocephalic and atraumatic.  Right Ear: External ear normal.  Left Ear: External ear normal.  Eyes: Conjunctivae are normal. Pupils are equal, round, and reactive to light. No scleral icterus.  Neck: Normal range of motion. Neck supple. No thyromegaly present.  Cardiovascular: Normal rate, regular rhythm, normal heart sounds and intact distal pulses.   Pulmonary/Chest: Effort normal and breath sounds normal. No respiratory distress.  Musculoskeletal: She exhibits no edema.  Lymphadenopathy:    She has no cervical adenopathy.  Neurological: She is alert and oriented to person, place, and time.  Skin: Skin is warm and dry. She is not diaphoretic. No erythema.  Psychiatric: She has a normal mood and affect. Her behavior is normal.  Nursing note and vitals reviewed.  BP 120/74 mmHg  Pulse 99  Temp(Src) 98.3 F (36.8 C) (Oral)  Resp 16  Ht 5\' 4"  (1.626 m)  Wt 164 lb (74.39 kg)  BMI 28.14 kg/m2  SpO2 98%      Assessment & Plan:   Type  II diabetes mellitus, uncontrolled - Plan: POCT glycosylated hemoglobin (Hb A1C), HM Diabetes Foot Exam, POCT glucose (manual entry)  Depression/Adjustment disorder with depressed mood - try to increase zoloft for now - recommended to pt to pursue EAP therapy since work is triggerying  Tobacco use disorder - smoking more due to stress - recommend adequate mood trx for now then hopefully will decrease at f/u.  Meds ordered this encounter  Medications  . sertraline (ZOLOFT) 100 MG tablet    Sig: Take 1 tablet (100 mg total) by mouth at bedtime.    Dispense:   90 tablet    Refill:  1  . Liraglutide 18 MG/3ML SOPN    Sig: Start 0.6 mg sq qd x 1 wk then 1.2 mg sq qd    Dispense:  6 mL    Refill:  3     Norberto Sorenson, MD MPH   Results for orders placed or performed in visit on 10/14/14  POCT glycosylated hemoglobin (Hb A1C)  Result Value Ref Range   Hemoglobin A1C 7.6   POCT glucose (manual entry)  Result Value Ref Range   POC Glucose 197 (A) 70 - 99 mg/dl

## 2014-10-26 ENCOUNTER — Other Ambulatory Visit: Payer: Self-pay

## 2014-10-26 MED ORDER — INSULIN PEN NEEDLE 32G X 4 MM MISC: Status: DC

## 2015-01-09 ENCOUNTER — Other Ambulatory Visit: Payer: Self-pay | Admitting: Family Medicine

## 2015-02-16 ENCOUNTER — Other Ambulatory Visit: Payer: Self-pay | Admitting: Family Medicine

## 2015-02-17 ENCOUNTER — Encounter: Payer: Self-pay | Admitting: Family Medicine

## 2015-02-17 ENCOUNTER — Ambulatory Visit (INDEPENDENT_AMBULATORY_CARE_PROVIDER_SITE_OTHER): Payer: 59 | Admitting: Family Medicine

## 2015-02-17 VITALS — BP 120/70 | HR 87 | Temp 98.3°F | Resp 16 | Ht 63.75 in | Wt 154.8 lb

## 2015-02-17 DIAGNOSIS — I10 Essential (primary) hypertension: Secondary | ICD-10-CM | POA: Diagnosis not present

## 2015-02-17 DIAGNOSIS — E119 Type 2 diabetes mellitus without complications: Secondary | ICD-10-CM

## 2015-02-17 DIAGNOSIS — F17200 Nicotine dependence, unspecified, uncomplicated: Secondary | ICD-10-CM

## 2015-02-17 DIAGNOSIS — R251 Tremor, unspecified: Secondary | ICD-10-CM | POA: Diagnosis not present

## 2015-02-17 DIAGNOSIS — E559 Vitamin D deficiency, unspecified: Secondary | ICD-10-CM | POA: Diagnosis not present

## 2015-02-17 DIAGNOSIS — E1165 Type 2 diabetes mellitus with hyperglycemia: Secondary | ICD-10-CM

## 2015-02-17 DIAGNOSIS — F329 Major depressive disorder, single episode, unspecified: Secondary | ICD-10-CM | POA: Diagnosis not present

## 2015-02-17 DIAGNOSIS — Z79899 Other long term (current) drug therapy: Secondary | ICD-10-CM | POA: Diagnosis not present

## 2015-02-17 DIAGNOSIS — E785 Hyperlipidemia, unspecified: Secondary | ICD-10-CM

## 2015-02-17 DIAGNOSIS — Z720 Tobacco use: Secondary | ICD-10-CM | POA: Diagnosis not present

## 2015-02-17 DIAGNOSIS — F32A Depression, unspecified: Secondary | ICD-10-CM

## 2015-02-17 LAB — COMPREHENSIVE METABOLIC PANEL
ALBUMIN: 4.4 g/dL (ref 3.5–5.2)
ALT: 18 U/L (ref 0–35)
AST: 17 U/L (ref 0–37)
Alkaline Phosphatase: 104 U/L (ref 39–117)
BILIRUBIN TOTAL: 0.3 mg/dL (ref 0.2–1.2)
BUN: 12 mg/dL (ref 6–23)
CO2: 24 mEq/L (ref 19–32)
Calcium: 9.1 mg/dL (ref 8.4–10.5)
Chloride: 105 mEq/L (ref 96–112)
Creat: 0.59 mg/dL (ref 0.50–1.10)
GLUCOSE: 139 mg/dL — AB (ref 70–99)
Potassium: 4.3 mEq/L (ref 3.5–5.3)
Sodium: 140 mEq/L (ref 135–145)
Total Protein: 6.8 g/dL (ref 6.0–8.3)

## 2015-02-17 LAB — POCT GLYCOSYLATED HEMOGLOBIN (HGB A1C): Hemoglobin A1C: 6.8

## 2015-02-17 LAB — LIPID PANEL
CHOL/HDL RATIO: 3.8 ratio
Cholesterol: 179 mg/dL (ref 0–200)
HDL: 47 mg/dL (ref >=46)
LDL CALC: 98 mg/dL (ref 0–99)
TRIGLYCERIDES: 168 mg/dL — AB (ref <150)
VLDL: 34 mg/dL (ref 0–40)

## 2015-02-17 LAB — GLUCOSE, POCT (MANUAL RESULT ENTRY): POC Glucose: 152 mg/dl — AB (ref 70–99)

## 2015-02-17 MED ORDER — SERTRALINE HCL 100 MG PO TABS: 100.0000 mg | ORAL_TABLET | Freq: Every day | ORAL | Status: DC

## 2015-02-17 MED ORDER — LIRAGLUTIDE 18 MG/3ML ~~LOC~~ SOPN: 1.2000 mg | PEN_INJECTOR | Freq: Every day | SUBCUTANEOUS | Status: DC

## 2015-02-17 NOTE — Progress Notes (Signed)
Subjective:  This chart was scribed for Norberto Sorenson, MD by Stann Ore, Medical Scribe. This patient was seen in Room 25 and the patient's care was started 9:42 AM.    Patient ID: Brittany [name], female    DOB: 1960-11-28, 54 y.o.   MRN: [id number] Chief Complaint  Patient presents with  . Follow-up    4 months  . Diabetes    HPI Brittany [name] is a 54 y.o. female who presents to [facility] for follow up.  Pt's A1C today is down to 6.8. Her weight has gone down 10 lbs since her last visit when she started on victoza which she is really encouraged about.  Pt states she is okay with the Zoloft and Pravastatin. She is not on fish oil anymore. She is not taking calcium or vitamin D.  She generally doesn't check blood sugar at home unless she feels symptoms coming. She sometimes takes aspirin.  She is still smoking. Thinking she may try quitting again this winter prior to the arrival of her twin grandchildren. She denies urinary problems.  She sees Dr. Hubbard Robinson as eye doctor.   She is thinking to retire from work.  Her daughter in law is planning to have twins in December.      Past Medical History  Diagnosis Date  . Diabetes mellitus without complication   . Depression    Current Outpatient Prescriptions on File Prior to Visit  Medication Sig Dispense Refill  . aspirin 81 MG tablet Take 81 mg by mouth daily.    Marland Kitchen atorvastatin (LIPITOR) 20 MG tablet TAKE 1 TABLET (20 MG TOTAL) BY MOUTH DAILY. 90 tablet 0  . Insulin Pen Needle 32G X 4 MM MISC Use to inject medication daily. Dx code: E11.9 100 each 3  . Liraglutide 18 MG/3ML SOPN Start 0.6 mg sq qd x 1 wk then 1.2 mg sq qd 6 mL 3  . losartan (COZAAR) 50 MG tablet Take 1 tablet (50 mg total) by mouth daily. 90 tablet 3  . metFORMIN (GLUCOPHAGE) 1000 MG tablet Take 1 tablet (1,000 mg total) by mouth 2 (two) times daily with a meal. 180 tablet 3  . pravastatin (PRAVACHOL) 40 MG tablet Take 1 tablet (40 mg total) by mouth daily. 90  tablet 3  . primidone (MYSOLINE) 250 MG tablet Take 1 tablet (250 mg total) by mouth at bedtime. 90 tablet 3  . sertraline (ZOLOFT) 100 MG tablet Take 1 tablet (100 mg total) by mouth at bedtime. 90 tablet 1  . Vitamin D, Ergocalciferol, (DRISDOL) 50000 UNITS CAPS capsule TAKE 1 CAPSULE (50,000 UNITS TOTAL) BY MOUTH ONCE A WEEK. 4 capsule 2  . varenicline (CHANTIX STARTING MONTH PAK) 0.5 MG X 11 & 1 MG X 42 tablet Take 0.5 mg tab po qd x 3d, then increase to 0.5 mg tab bid x 4 d, then increase to 1 mg tablet bid. (Patient not taking: Reported on 10/14/2014) 53 tablet 0   No current facility-administered medications on file prior to visit.   No Known Allergies    Review of Systems  Constitutional: Positive for fatigue. Negative for fever, chills, diaphoresis, activity change, appetite change and unexpected weight change.  Eyes: Negative for visual disturbance.  Respiratory: Negative for cough, chest tightness and shortness of breath.   Cardiovascular: Negative for chest pain, palpitations and leg swelling.  Genitourinary: Negative for urgency, frequency and decreased urine volume.  Neurological: Negative for syncope and headaches.  Hematological: Does not bruise/bleed easily.  Psychiatric/Behavioral:  Negative for dysphoric mood. The patient is not nervous/anxious.        Objective:   Physical Exam  Constitutional: She is oriented to person, place, and time. She appears well-developed and well-nourished. No distress.  HENT:  Head: Normocephalic and atraumatic.  Eyes: EOM are normal. Pupils are equal, round, and reactive to light.  Neck: Neck supple.  Cardiovascular: Normal rate, regular rhythm, S1 normal, S2 normal and normal heart sounds.   No murmur heard. Pulmonary/Chest: Effort normal and breath sounds normal. No respiratory distress. She has no wheezes.  Musculoskeletal: Normal range of motion.  Lymphadenopathy:    She has no cervical adenopathy.  Neurological: She is alert and  oriented to person, place, and time.  Skin: Skin is warm and dry.  Psychiatric: She has a normal mood and affect. Her behavior is normal.  Nursing note and vitals reviewed.  Results for orders placed or performed in visit on 02/17/15  POCT glucose (manual entry)  Result Value Ref Range   POC Glucose 152 (A) 70 - 99 mg/dl  POCT glycosylated hemoglobin (Hb A1C)  Result Value Ref Range   Hemoglobin A1C 6.8      BP 120/70 mmHg  Pulse 87  Temp(Src) 98.3 F (36.8 C) (Oral)  Resp 16  Ht 5' 3.75" (1.619 m)  Wt 154 lb 12.8 oz (70.217 kg)  BMI 26.79 kg/m2  SpO2 94%       Assessment & Plan:   Patient will be due for refills of her chronic medications prior to her next physical. Okay to send in refills whenever requested by pharmacy.   1. Type 2 diabetes mellitus with hyperglycemia - hgba1c improved and 10lbs weightloss since starting victoza so cont along with metformin  2. Diabetes mellitus without complication   3. Hyperlipidemia LDL goal <70 - Patient does not need to be fasting at her physical since lipids done today and have been well controlled on pravastatin prev but LDL slightly elevated today so increase pravastatin from 40 to 80 - recheck in 1 yr. Pt was on atorvastatin 20 previously but report that it caused mental confusion so d/c'd/  4. Polypharmacy   5. Vitamin D deficiency   6. Essential hypertension, benign   7. Tobacco use disorder - try chantix this winter before the birth of her grandchildren  8. Tremor - on long standing mysoline  9. Depression - improved - cont zoloft    Orders Placed This Encounter  Procedures  . Comprehensive metabolic panel    Order Specific Question:  Has the patient fasted?    Answer:  Yes  . Lipid panel    Order Specific Question:  Has the patient fasted?    Answer:  Yes  . Vit D  25 hydroxy (rtn osteoporosis monitoring)  . POCT glucose (manual entry)  . POCT glycosylated hemoglobin (Hb A1C)  . HM Diabetes Foot Exam    Meds  ordered this encounter  Medications  . sertraline (ZOLOFT) 100 MG tablet    Sig: Take 1 tablet (100 mg total) by mouth at bedtime.    Dispense:  90 tablet    Refill:  3  . Liraglutide 18 MG/3ML SOPN    Sig: Inject 0.2 mLs (1.2 mg total) into the skin daily. Start 0.6 mg sq qd x 1 wk then 1.2 mg sq qd    Dispense:  6 mL    Refill:  5    I personally performed the services described in this documentation, which was scribed in  my presence. The recorded information has been reviewed and considered, and addended by me as needed.  Norberto Sorenson, MD MPH  Results for orders placed or performed in visit on 02/17/15  Comprehensive metabolic panel  Result Value Ref Range   Sodium 140 135 - 145 mEq/L   Potassium 4.3 3.5 - 5.3 mEq/L   Chloride 105 96 - 112 mEq/L   CO2 24 19 - 32 mEq/L   Glucose, Bld 139 (H) 70 - 99 mg/dL   BUN 12 6 - 23 mg/dL   Creat 1.61 0.96 - 0.45 mg/dL   Total Bilirubin 0.3 0.2 - 1.2 mg/dL   Alkaline Phosphatase 104 39 - 117 U/L   AST 17 0 - 37 U/L   ALT 18 0 - 35 U/L   Total Protein 6.8 6.0 - 8.3 g/dL   Albumin 4.4 3.5 - 5.2 g/dL   Calcium 9.1 8.4 - 40.9 mg/dL  Lipid panel  Result Value Ref Range   Cholesterol 179 0 - 200 mg/dL   Triglycerides 811 (H) <150 mg/dL   HDL 47 >=91 mg/dL   Total CHOL/HDL Ratio 3.8 Ratio   VLDL 34 0 - 40 mg/dL   LDL Cholesterol 98 0 - 99 mg/dL  Vit D  25 hydroxy (rtn osteoporosis monitoring)  Result Value Ref Range   Vit D, 25-Hydroxy 34 30 - 100 ng/mL  POCT glucose (manual entry)  Result Value Ref Range   POC Glucose 152 (A) 70 - 99 mg/dl  POCT glycosylated hemoglobin (Hb A1C)  Result Value Ref Range   Hemoglobin A1C 6.8

## 2015-02-17 NOTE — Patient Instructions (Signed)
Secondhand Smoke Secondhand smoke is the smoke exhaled by smokers and the smoke given off by a burning cigarette, cigar, or pipe. When a cigarette is smoked, about half of the smoke is inhaled and exhaled by the smoker, and the other half floats around in the air. Exposure to secondhand smoke is also called involuntary smoking or passive smoking. People can be exposed to secondhand smoke in:   Homes.  Cars.  Workplaces.  Public places (bars, restaurants, other recreation sites). Exposure to secondhand smoke is hazardous.It contains more than 250 harmful chemicals, including at least 60 that can cause cancer. These chemicals include:  Arsenic, a heavy metal toxin.  Benzene, a chemical found in gasoline.  Beryllium, a toxic metal.  Cadmium, a metal used in batteries.  Chromium, a metallic element.  Ethylene oxide, a chemical used to sterilize medical devices.  Nickel, a metallic element.  Polonium-210, a chemical element that gives off radiation.  Vinyl chloride, a toxic substance used in the manufacture of plastics. Nonsmoking spouses and family members of smokers have higher rates of cancer, heart disease, and serious respiratory illnesses than those not exposed to secondhand smoke.  Nicotine, a nicotine by-product called cotinine, carbon monoxide, and other evidence of secondhand smoke exposure have been found in the body fluids of nonsmokers exposed to secondhand smoke.  Living with a smoker may increase a nonsmoker's chances of developing lung cancer by 20 to 30 percent.  Secondhand smoke may increase the risk of breast cancer, nasal sinus cavity cancer, cervical cancer, bladder cancer, and nose and throat (nasopharyngeal) cancer in adults.  Secondhand smoke may increase the risk of heart disease by 25 to 30 percent. Children are especially at risk from secondhand smoke exposure. Children of smokers have higher rates  of:  Pneumonia.  Asthma.  Smoking.  Bronchitis.  Colds.  Chronic cough.  Ear infections.  Tonsilitis.  School absences. Research suggests that exposure to secondhand smoke may cause leukemia, lymphoma, and brain tumors in children. Babies are three times more likely to die from sudden infant death syndrome (SIDS) if their mothers smoked during and after pregnancy. There is no safe level of exposure to secondhand smoke. Studies have shown that even low levels of exposure can be harmful. The only way to fully protect nonsmokers from secondhand smoke exposure is to completely eliminate smoking in indoor spaces. The best thing you can do for your own health and for your children's health is to stop smoking. You should stop as soon as possible. This is not easy, and you may fail several times at quitting before you get free of this addiction. Nicotine replacement therapy ( such as patches, gum, or lozenges) can help. These therapies can help you deal with the physical symptoms of withdrawal. Attending quit-smoking support groups can help you deal with the emotional issues of quitting smoking.  Even if you are not ready to quit right now, there are some simple changes you can make to reduce the effect of your smoking on your family:  Do not smoke in your home. Smoke away from your home in an open area, preferably outside.  Ask others to not smoke in your home.  Do not smoke while holding a child or when children are near.  Do not smoke in your car.  Avoid restaurants, day care centers, and other places that allow smoking. Document Released: 10/03/2004 Document Revised: 05/20/2012 Document Reviewed: 12/10/2013 Exit[employer] Patient Information 2015 [employer]. This information is not intended to replace advice given to   you by your health care provider. Make sure you discuss any questions you have with your health care provider.  

## 2015-02-18 LAB — VITAMIN D 25 HYDROXY (VIT D DEFICIENCY, FRACTURES): Vit D, 25-Hydroxy: 34 ng/mL (ref 30–100)

## 2015-02-20 ENCOUNTER — Other Ambulatory Visit: Payer: Self-pay | Admitting: Family Medicine

## 2015-02-20 ENCOUNTER — Telehealth: Payer: Self-pay | Admitting: *Deleted

## 2015-02-20 NOTE — Telephone Encounter (Signed)
Phoned [facility] [employer] [facility] & spoke with Edgardo Roys (pt not been seen in past 3 years).  Composing & sending mammo reminder ltr.

## 2015-02-22 ENCOUNTER — Encounter: Payer: Self-pay | Admitting: Family Medicine

## 2015-02-22 MED ORDER — PRAVASTATIN SODIUM 80 MG PO TABS: 80.0000 mg | ORAL_TABLET | Freq: Every day | ORAL | Status: DC

## 2015-03-15 LAB — HM DIABETES EYE EXAM

## 2015-04-13 ENCOUNTER — Encounter: Payer: Self-pay | Admitting: *Deleted

## 2015-04-20 ENCOUNTER — Other Ambulatory Visit: Payer: Self-pay | Admitting: Family Medicine

## 2015-07-20 ENCOUNTER — Ambulatory Visit (INDEPENDENT_AMBULATORY_CARE_PROVIDER_SITE_OTHER): Payer: 59 | Admitting: Family Medicine

## 2015-07-20 ENCOUNTER — Encounter: Payer: Self-pay | Admitting: Family Medicine

## 2015-07-20 VITALS — BP 117/76 | HR 81 | Temp 98.5°F | Resp 16 | Ht 63.5 in | Wt 161.0 lb

## 2015-07-20 DIAGNOSIS — Z79899 Other long term (current) drug therapy: Secondary | ICD-10-CM | POA: Diagnosis not present

## 2015-07-20 DIAGNOSIS — Z1329 Encounter for screening for other suspected endocrine disorder: Secondary | ICD-10-CM

## 2015-07-20 DIAGNOSIS — F329 Major depressive disorder, single episode, unspecified: Secondary | ICD-10-CM | POA: Diagnosis not present

## 2015-07-20 DIAGNOSIS — Z1383 Encounter for screening for respiratory disorder NEC: Secondary | ICD-10-CM

## 2015-07-20 DIAGNOSIS — Z113 Encounter for screening for infections with a predominantly sexual mode of transmission: Secondary | ICD-10-CM | POA: Diagnosis not present

## 2015-07-20 DIAGNOSIS — Z130 Encounter for screening for diseases of the blood and blood-forming organs and certain disorders involving the immune mechanism: Secondary | ICD-10-CM | POA: Diagnosis not present

## 2015-07-20 DIAGNOSIS — F17200 Nicotine dependence, unspecified, uncomplicated: Secondary | ICD-10-CM

## 2015-07-20 DIAGNOSIS — Z1389 Encounter for screening for other disorder: Secondary | ICD-10-CM | POA: Diagnosis not present

## 2015-07-20 DIAGNOSIS — E1149 Type 2 diabetes mellitus with other diabetic neurological complication: Secondary | ICD-10-CM | POA: Diagnosis not present

## 2015-07-20 DIAGNOSIS — Z1239 Encounter for other screening for malignant neoplasm of breast: Secondary | ICD-10-CM | POA: Diagnosis not present

## 2015-07-20 DIAGNOSIS — F32A Depression, unspecified: Secondary | ICD-10-CM

## 2015-07-20 DIAGNOSIS — Z0000 Encounter for general adult medical examination without abnormal findings: Secondary | ICD-10-CM | POA: Diagnosis not present

## 2015-07-20 DIAGNOSIS — Z1212 Encounter for screening for malignant neoplasm of rectum: Secondary | ICD-10-CM | POA: Diagnosis not present

## 2015-07-20 DIAGNOSIS — Z1211 Encounter for screening for malignant neoplasm of colon: Secondary | ICD-10-CM | POA: Diagnosis not present

## 2015-07-20 DIAGNOSIS — E785 Hyperlipidemia, unspecified: Secondary | ICD-10-CM

## 2015-07-20 DIAGNOSIS — Z124 Encounter for screening for malignant neoplasm of cervix: Secondary | ICD-10-CM

## 2015-07-20 DIAGNOSIS — Z136 Encounter for screening for cardiovascular disorders: Secondary | ICD-10-CM | POA: Diagnosis not present

## 2015-07-20 LAB — POCT URINALYSIS DIP (MANUAL ENTRY)
BILIRUBIN UA: NEGATIVE
Bilirubin, UA: NEGATIVE
Blood, UA: NEGATIVE
Glucose, UA: NEGATIVE
Nitrite, UA: NEGATIVE
Protein Ur, POC: NEGATIVE
Spec Grav, UA: 1.025
Urobilinogen, UA: 0.2
pH, UA: 5.5

## 2015-07-20 LAB — POCT WET + KOH PREP
TRICH BY WET PREP: ABSENT
YEAST BY WET PREP: ABSENT
Yeast by KOH: ABSENT

## 2015-07-20 LAB — POCT GLYCOSYLATED HEMOGLOBIN (HGB A1C): HEMOGLOBIN A1C: 7.1

## 2015-07-20 LAB — HEMOGLOBIN A1C: Hgb A1c MFr Bld: 7.1 % — AB (ref 4.0–6.0)

## 2015-07-20 NOTE — Patient Instructions (Addendum)
[facility] is now offering annual lung cancer screening by low-dose CT scan.  This is covered for qualifying patients and your insurance will be checked before the procedure.  Call the lung cancer screening nurse navigators at [phone] to learn more about this and get scheduled.  You need to schedule you mammogram.  Please call the breast center at [place] at [phone] to schedule  Menopause is a normal process in which your reproductive ability comes to an end. This process happens gradually over a span of months to years, usually between the ages of 58 and 62. Menopause is complete when you have missed 12 consecutive menstrual periods. It is important to talk with your health care provider about some of the most common conditions that affect postmenopausal women, such as heart disease, cancer, and bone loss (osteoporosis). Adopting a healthy lifestyle and getting preventive care can help to promote your health and wellness. Those actions can also lower your chances of developing some of these common conditions. WHAT SHOULD I KNOW ABOUT MENOPAUSE? During menopause, you may experience a number of symptoms, such as:  Moderate-to-severe hot flashes.  Night sweats.  Decrease in sex drive.  Mood swings.  Headaches.  Tiredness.  Irritability.  Memory problems.  Insomnia. Choosing to treat or not to treat menopausal changes is an individual decision that you make with your health care provider. WHAT SHOULD I KNOW ABOUT HORMONE REPLACEMENT THERAPY AND SUPPLEMENTS? Hormone therapy products are effective for treating symptoms that are associated with menopause, such as hot flashes and night sweats. Hormone replacement carries certain risks, especially as you become older. If you are thinking about using estrogen or estrogen with progestin treatments, discuss the benefits and risks with your health care provider. WHAT SHOULD I KNOW ABOUT HEART DISEASE AND STROKE? Heart disease,  heart attack, and stroke become more likely as you age. This may be due, in part, to the hormonal changes that your body experiences during menopause. These can affect how your body processes dietary fats, triglycerides, and cholesterol. Heart attack and stroke are both medical emergencies. There are many things that you can do to help prevent heart disease and stroke:  Have your blood pressure checked at least every 1-2 years. High blood pressure causes heart disease and increases the risk of stroke.  If you are 84-62 years old, ask your health care provider if you should take aspirin to prevent a heart attack or a stroke.  Do not use any tobacco products, including cigarettes, chewing tobacco, or electronic cigarettes. If you need help quitting, ask your health care provider.  It is important to eat a healthy diet and maintain a healthy weight.  Be sure to include plenty of vegetables, fruits, low-fat dairy products, and lean protein.  Avoid eating foods that are high in solid fats, added sugars, or salt (sodium).  Get regular exercise. This is one of the most important things that you can do for your health.  Try to exercise for at least 150 minutes each week. The type of exercise that you do should increase your heart rate and make you sweat. This is known as moderate-intensity exercise.  Try to do strengthening exercises at least twice each week. Do these in addition to the moderate-intensity exercise.  Know your numbers.Ask your health care provider to check your cholesterol and your blood glucose. Continue to have your blood tested as directed by your health care provider. WHAT SHOULD I KNOW ABOUT CANCER SCREENING? There are several types of cancer.  Take the following steps to reduce your risk and to catch any cancer development as early as possible. Breast Cancer  Practice breast self-awareness.  This means understanding how your breasts normally appear and feel.  It also means  doing regular breast self-exams. Let your health care provider know about any changes, no matter how small.  If you are 18 or older, have a clinician do a breast exam (clinical breast exam or CBE) every year. Depending on your age, family history, and medical history, it may be recommended that you also have a yearly breast X-ray (mammogram).  If you have a family history of breast cancer, talk with your health care provider about genetic screening.  If you are at high risk for breast cancer, talk with your health care provider about having an MRI and a mammogram every year.  Breast cancer (BRCA) gene test is recommended for women who have family members with BRCA-related cancers. Results of the assessment will determine the need for genetic counseling and BRCA1 and for BRCA2 testing. BRCA-related cancers include these types:  Breast. This occurs in males or females.  Ovarian.  Tubal. This may also be called fallopian tube cancer.  Cancer of the abdominal or pelvic lining (peritoneal cancer).  Prostate.  Pancreatic. Cervical, Uterine, and Ovarian Cancer Your health care provider may recommend that you be screened regularly for cancer of the pelvic organs. These include your ovaries, uterus, and vagina. This screening involves a pelvic exam, which includes checking for microscopic changes to the surface of your cervix (Pap test).  For women ages 21-65, health care providers may recommend a pelvic exam and a Pap test every three years. For women ages 59-65, they may recommend the Pap test and pelvic exam, combined with testing for human papilloma virus (HPV), every five years. Some types of HPV increase your risk of cervical cancer. Testing for HPV may also be done on women of any age who have unclear Pap test results.  Other health care providers may not recommend any screening for nonpregnant women who are considered low risk for pelvic cancer and have no symptoms. Ask your health care  provider if a screening pelvic exam is right for you.  If you have had past treatment for cervical cancer or a condition that could lead to cancer, you need Pap tests and screening for cancer for at least 20 years after your treatment. If Pap tests have been discontinued for you, your risk factors (such as having a new sexual partner) need to be reassessed to determine if you should start having screenings again. Some women have medical problems that increase the chance of getting cervical cancer. In these cases, your health care provider may recommend that you have screening and Pap tests more often.  If you have a family history of uterine cancer or ovarian cancer, talk with your health care provider about genetic screening.  If you have vaginal bleeding after reaching menopause, tell your health care provider.  There are currently no reliable tests available to screen for ovarian cancer. Lung Cancer Lung cancer screening is recommended for adults 24-45 years old who are at high risk for lung cancer because of a history of smoking. A yearly low-dose CT scan of the lungs is recommended if you:  Currently smoke.  Have a history of at least 30 pack-years of smoking and you currently smoke or have quit within the past 15 years. A pack-year is smoking an average of one pack of cigarettes per day  for one year. Yearly screening should:  Continue until it has been 15 years since you quit.  Stop if you develop a health problem that would prevent you from having lung cancer treatment. Colorectal Cancer  This type of cancer can be detected and can often be prevented.  Routine colorectal cancer screening usually begins at age 34 and continues through age 48.  If you have risk factors for colon cancer, your health care provider may recommend that you be screened at an earlier age.  If you have a family history of colorectal cancer, talk with your health care provider about genetic screening.  Your  health care provider may also recommend using home test kits to check for hidden blood in your stool.  A small camera at the end of a tube can be used to examine your colon directly (sigmoidoscopy or colonoscopy). This is done to check for the earliest forms of colorectal cancer.  Direct examination of the colon should be repeated every 5-10 years until age 36. However, if early forms of precancerous polyps or small growths are found or if you have a family history or genetic risk for colorectal cancer, you may need to be screened more often. Skin Cancer  Check your skin from head to toe regularly.  Monitor any moles. Be sure to tell your health care provider:  About any new moles or changes in moles, especially if there is a change in a mole's shape or color.  If you have a mole that is larger than the size of a pencil eraser.  If any of your family members has a history of skin cancer, especially at a young age, talk with your health care provider about genetic screening.  Always use sunscreen. Apply sunscreen liberally and repeatedly throughout the day.  Whenever you are outside, protect yourself by wearing long sleeves, pants, a wide-brimmed hat, and sunglasses. WHAT SHOULD I KNOW ABOUT OSTEOPOROSIS? Osteoporosis is a condition in which bone destruction happens more quickly than new bone creation. After menopause, you may be at an increased risk for osteoporosis. To help prevent osteoporosis or the bone fractures that can happen because of osteoporosis, the following is recommended:  If you are 80-31 years old, get at least 1,000 mg of calcium and at least 600 mg of vitamin D per day.  If you are older than age 31 but younger than age 59, get at least 1,200 mg of calcium and at least 600 mg of vitamin D per day.  If you are older than age 76, get at least 1,200 mg of calcium and at least 800 mg of vitamin D per day. Smoking and excessive alcohol intake increase the risk of  osteoporosis. Eat foods that are rich in calcium and vitamin D, and do weight-bearing exercises several times each week as directed by your health care provider. WHAT SHOULD I KNOW ABOUT HOW MENOPAUSE AFFECTS [place]? Depression may occur at any age, but it is more common as you become older. Common symptoms of depression include:  Low or sad mood.  Changes in sleep patterns.  Changes in appetite or eating patterns.  Feeling an overall lack of motivation or enjoyment of activities that you previously enjoyed.  Frequent crying spells. Talk with your health care provider if you think that you are experiencing depression. WHAT SHOULD I KNOW ABOUT IMMUNIZATIONS? It is important that you get and maintain your immunizations. These include:  Tetanus, diphtheria, and pertussis (Tdap) booster vaccine.  Influenza every year  before the flu season begins.  Pneumonia vaccine.  Shingles vaccine. Your health care provider may also recommend other immunizations.   This information is not intended to replace advice given to you by your health care provider. Make sure you discuss any questions you have with your health care provider.   Document Released: 10/18/2005 Document Revised: 09/16/2014 Document Reviewed: 04/28/2014 Elsevier Interactive Patient Education [employer].

## 2015-07-20 NOTE — Progress Notes (Signed)
Subjective:    Patient ID: Brittany [name], female    DOB: September 17, 1960, 54 y.o.   MRN: [id number]  Chief Complaint  Patient presents with  . Annual Exam  . Gynecologic Exam    HPI Taking vit D increased pravastatin from 40 to 80 4 mos ago lipitor caused mental confusion Flu shot at work and has had right arm pain a ost mo. tdap 2014, colnoscopy 2013 --> on every 3 years protocol.  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  Past Medical History  Diagnosis Date  . Diabetes mellitus without complication (HCC)   . Depression    Past Surgical History  Procedure Laterality Date  . Cholecystectomy    . Spine surgery     Current Outpatient Prescriptions on File Prior to Visit  Medication Sig Dispense Refill  . Insulin Pen Needle 32G X 4 MM MISC Use to inject medication daily. Dx code: E11.9 100 each 3  . pravastatin (PRAVACHOL) 80 MG tablet Take 1 tablet (80 mg total) by mouth daily. 90 tablet 3  . sertraline (ZOLOFT) 100 MG tablet Take 1 tablet (100 mg total) by mouth at bedtime. 90 tablet 3   No current facility-administered medications on file prior to visit.   No Known Allergies Family History  Problem Relation Age of Onset  . Cancer Mother   . Diabetes Mother   . Heart disease Mother   . Diabetes Father   . Heart disease Father   . Cancer Maternal Grandmother 62    lung  . COPD Maternal Aunt    Social History   Social History  . Marital Status: Married    Spouse Name: [name]  . Number of Children: 2  . Years of Education: 66   Social History Main Topics  . Smoking status: Current Every Day Smoker -- 1.00 packs/day for 2 years    Types: Cigarettes  . Smokeless tobacco: Never Used  . Alcohol Use: Yes     Comment: rarely  . Drug Use: No  . Sexual Activity: No   Other Topics Concern  . None   Social History Narrative   Patient is married ([name]) and lives at home with her family   Patient has two  children.   Patient works at [employer].   Patient has a college education.   Patient is right-handed.   Patient drinks four cups of coffee M-F.         Depression screen [facility] 2/9 07/20/2015 02/17/2015 10/14/2014 12/03/2013  Decreased Interest 0 0 1 1  Down, Depressed, Hopeless 0 0 2 0  PHQ - 2 Score 0 0 3 1  Altered sleeping - - 0 -  Tired, decreased energy - - 0 -  Change in appetite - - 0 -  Feeling bad or failure about yourself  - - 2 -  Trouble concentrating - - 0 -  Moving slowly or fidgety/restless - - 2 -  Suicidal thoughts - - 0 -  PHQ-9 Score - - 7 -      Review of Systems  Musculoskeletal: Positive for arthralgias.  All other systems reviewed and are negative.      Objective:   Physical Exam  Constitutional: She is oriented to person, place, and time. She appears well-developed and well-nourished. No distress.  HENT:  Head: Normocephalic and atraumatic.  Right Ear: Tympanic membrane, external ear and ear canal normal.  Left Ear: Tympanic membrane, external  ear and ear canal normal.  Nose: Mucosal edema present. No rhinorrhea.  Mouth/Throat: Uvula is midline, oropharynx is clear and moist and mucous membranes are normal. No posterior oropharyngeal erythema.  Eyes: Conjunctivae and EOM are normal. Pupils are equal, round, and reactive to light. Right eye exhibits no discharge. Left eye exhibits no discharge. No scleral icterus.  Neck: Normal range of motion. Neck supple. No thyromegaly present.  Cardiovascular: Normal rate, regular rhythm, normal heart sounds and intact distal pulses.   Pulmonary/Chest: Effort normal and breath sounds normal. No respiratory distress.  Abdominal: Soft. Bowel sounds are normal. There is no tenderness.  Genitourinary: Vagina normal and uterus normal. No breast swelling, tenderness, discharge or bleeding. Cervix exhibits no motion tenderness and no friability. Right adnexum displays no mass and no tenderness. Left adnexum  displays no mass and no tenderness.  Musculoskeletal: She exhibits no edema.  Lymphadenopathy:    She has no cervical adenopathy.  Neurological: She is alert and oriented to person, place, and time. She has normal reflexes.  Skin: Skin is warm and dry. She is not diaphoretic. No erythema.  Psychiatric: She has a normal mood and affect. Her behavior is normal.   BP 117/76 mmHg  Pulse 81  Temp(Src) 98.5 F (36.9 C)  Resp 16  Ht 5' 3.5" (1.613 m)  Wt 161 lb (73.029 kg)  BMI 28.07 kg/m2   Results for orders placed or performed in visit on 07/20/15  POCT urinalysis dipstick  Result Value Ref Range   Color, UA other (A) yellow   Clarity, UA clear clear   Glucose, UA negative negative   Bilirubin, UA negative negative   Ketones, POC UA negative negative   Spec Grav, UA 1.025    Blood, UA negative negative   pH, UA 5.5    Protein Ur, POC negative negative   Urobilinogen, UA 0.2    Nitrite, UA Negative Negative   Leukocytes, UA small (1+) (A) Negative  POCT glycosylated hemoglobin (Hb A1C)  Result Value Ref Range   Hemoglobin A1C 7.1   POCT Wet + KOH Prep  Result Value Ref Range   Yeast by KOH Absent Present, Absent   Yeast by wet prep Absent Present, Absent   WBC by wet prep Few None, Few, Too numerous to count   Clue Cells Wet Prep HPF POC Few (A) None, Too numerous to count   Trich by wet prep Absent Present, Absent   Bacteria Wet Prep HPF POC None None, Few, Too numerous to count   Epithelial Cells By [employer] ([facility]) Few None, Few, Too numerous to count   RBC,UR,HPF,POC None None RBC/hpf  Pap IG and HPV (high risk) DNA detection  Result Value Ref Range   HPV DNA High Risk     Specimen adequacy: SEE NOTE    FINAL DIAGNOSIS: SEE NOTE    Cytotechnologist: SEE NOTE     Assessment & Plan:   1. Screening for breast cancer  - pt will sched  2. Annual physical exam   3. Routine screening for STI (sexually transmitted infection)   4. Screening for cardiovascular,  respiratory, and genitourinary diseases   5. Screening for cervical cancer   6. Screening for colorectal cancer   7. Screening for deficiency anemia   8. Screening for thyroid disorder   9. Tobacco use disorder - Quit smoking!!!!  Discussed lung cancer screening  10. Depression   11. Hyperlipidemia LDL goal <70 - Lipid panel- pt is fasting today  12. Polypharmacy  13. 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 recheck in 4 mos   Ca/vit D  Orders Placed This Encounter  Procedures  . MM Digital Screening    Standing Status: Future     Number of Occurrences:      Standing Expiration Date: 09/18/2016    Order Specific Question:  Reason for Exam (SYMPTOM  OR DIAGNOSIS REQUIRED)    Answer:  screening    Order Specific Question:  Is the patient pregnant?    Answer:  No    Order Specific Question:  Preferred imaging location?    Answer:  [facility]  . HIV antibody  . Microalbumin, urine  . CBC  . Comprehensive metabolic panel  . TSH  . Hepatitis C Antibody  . Lipid panel    Order Specific Question:  Has the patient fasted?    Answer:  Yes  . POCT urinalysis dipstick  . POCT glycosylated hemoglobin (Hb A1C)  . POCT Wet + KOH Prep    Meds ordered this encounter  Medications  . DISCONTD: linagliptin (TRADJENTA) 5 MG TABS tablet    Sig: Take 5 mg by mouth daily.  . cholecalciferol (VITAMIN D) 1000 UNITS tablet    Sig: Take 1,000 Units by mouth daily.  . metFORMIN (GLUCOPHAGE) 1000 MG tablet    Sig: Take 1 tablet (1,000 mg total) by mouth 2 (two) times daily with a meal.    Dispense:  180 tablet    Refill:  3  . linagliptin (TRADJENTA) 5 MG TABS tablet    Sig: Take 1 tablet (5 mg total) by mouth daily.    Dispense:  90 tablet    Refill:  3  . primidone (MYSOLINE) 250 MG tablet    Sig: Take 1 tablet (250 mg total) by mouth at bedtime.    Dispense:  90 tablet    Refill:  3  . losartan  (COZAAR) 50 MG tablet    Sig: Take 1 tablet (50 mg total) by mouth daily.    Dispense:  90 tablet    Refill:  3    Norberto SorensonEva Shenelle Klas, MD MPH

## 2015-07-21 ENCOUNTER — Encounter: Payer: Self-pay | Admitting: Family Medicine

## 2015-07-21 ENCOUNTER — Encounter: Payer: 59 | Admitting: Family Medicine

## 2015-07-21 LAB — COMPREHENSIVE METABOLIC PANEL
ALT: 14 U/L (ref 6–29)
AST: 14 U/L (ref 10–35)
Albumin: 4.1 g/dL (ref 3.6–5.1)
Alkaline Phosphatase: 96 U/L (ref 33–130)
BUN: 9 mg/dL (ref 7–25)
CALCIUM: 9.6 mg/dL (ref 8.6–10.4)
CO2: 25 mmol/L (ref 20–31)
Chloride: 105 mmol/L (ref 98–110)
Creat: 0.63 mg/dL (ref 0.50–1.05)
Glucose, Bld: 114 mg/dL — ABNORMAL HIGH (ref 65–99)
POTASSIUM: 4.5 mmol/L (ref 3.5–5.3)
Sodium: 141 mmol/L (ref 135–146)
TOTAL PROTEIN: 6.9 g/dL (ref 6.1–8.1)
Total Bilirubin: 0.4 mg/dL (ref 0.2–1.2)

## 2015-07-21 LAB — CBC
HEMATOCRIT: 41.9 % (ref 36.0–46.0)
HEMOGLOBIN: 14.5 g/dL (ref 12.0–15.0)
MCH: 30.7 pg (ref 26.0–34.0)
MCHC: 34.6 g/dL (ref 30.0–36.0)
MCV: 88.8 fL (ref 78.0–100.0)
MPV: 9.3 fL (ref 8.6–12.4)
PLATELETS: 260 10*3/uL (ref 150–400)
RBC: 4.72 MIL/uL (ref 3.87–5.11)
RDW: 12.4 % (ref 11.5–15.5)
WBC: 8 10*3/uL (ref 4.0–10.5)

## 2015-07-21 LAB — MICROALBUMIN, URINE: Microalb, Ur: 0.7 mg/dL

## 2015-07-21 LAB — HIV ANTIBODY (ROUTINE TESTING W REFLEX): HIV 1&2 Ab, 4th Generation: NONREACTIVE

## 2015-07-21 LAB — LIPID PANEL
CHOL/HDL RATIO: 2.2 ratio (ref <=5.0)
CHOLESTEROL: 157 mg/dL (ref 125–200)
HDL: 73 mg/dL (ref >=46)
LDL CALC: 67 mg/dL (ref <130)
TRIGLYCERIDES: 86 mg/dL (ref <150)
VLDL: 17 mg/dL (ref <30)

## 2015-07-21 LAB — TSH: TSH: 1.157 u[IU]/mL (ref 0.350–4.500)

## 2015-07-21 LAB — HEPATITIS C ANTIBODY: HCV Ab: NEGATIVE

## 2015-07-21 MED ORDER — PRIMIDONE 250 MG PO TABS: 250.0000 mg | ORAL_TABLET | Freq: Every day | ORAL | Status: DC

## 2015-07-21 MED ORDER — LINAGLIPTIN 5 MG PO TABS: 5.0000 mg | ORAL_TABLET | Freq: Every day | ORAL | Status: DC

## 2015-07-21 MED ORDER — LOSARTAN POTASSIUM 50 MG PO TABS: 50.0000 mg | ORAL_TABLET | Freq: Every day | ORAL | Status: DC

## 2015-07-21 MED ORDER — METFORMIN HCL 1000 MG PO TABS: 1000.0000 mg | ORAL_TABLET | Freq: Two times a day (BID) | ORAL | Status: DC

## 2015-07-24 ENCOUNTER — Other Ambulatory Visit: Payer: Self-pay

## 2015-07-24 DIAGNOSIS — Z1231 Encounter for screening mammogram for malignant neoplasm of breast: Secondary | ICD-10-CM

## 2015-07-24 LAB — PAP IG AND HPV HIGH-RISK: HPV DNA HIGH RISK: NOT DETECTED

## 2015-07-26 ENCOUNTER — Other Ambulatory Visit: Payer: Self-pay

## 2015-08-10 ENCOUNTER — Encounter: Payer: Self-pay | Admitting: Family Medicine

## 2015-08-24 ENCOUNTER — Ambulatory Visit
Admission: RE | Admit: 2015-08-24 | Discharge: 2015-08-24 | Disposition: A | Discharge status: Home / Self Care | Payer: 59 | Source: Ambulatory Visit | Attending: Family Medicine | Admitting: Family Medicine

## 2015-08-24 DIAGNOSIS — Z1231 Encounter for screening mammogram for malignant neoplasm of breast: Secondary | ICD-10-CM

## 2015-09-01 ENCOUNTER — Ambulatory Visit (INDEPENDENT_AMBULATORY_CARE_PROVIDER_SITE_OTHER): Payer: 59

## 2015-09-01 ENCOUNTER — Ambulatory Visit (INDEPENDENT_AMBULATORY_CARE_PROVIDER_SITE_OTHER): Payer: 59 | Admitting: Family Medicine

## 2015-09-01 VITALS — BP 118/70 | HR 93 | Temp 98.4°F | Resp 16 | Ht 64.5 in | Wt 166.4 lb

## 2015-09-01 DIAGNOSIS — M5030 Other cervical disc degeneration, unspecified cervical region: Secondary | ICD-10-CM

## 2015-09-01 DIAGNOSIS — M79601 Pain in right arm: Secondary | ICD-10-CM | POA: Diagnosis not present

## 2015-09-01 MED ORDER — HYDROCODONE-ACETAMINOPHEN 5-325 MG PO TABS: 1.0000 | ORAL_TABLET | Freq: Four times a day (QID) | ORAL | Status: DC | PRN

## 2015-09-01 MED ORDER — CYCLOBENZAPRINE HCL 10 MG PO TABS: 10.0000 mg | ORAL_TABLET | Freq: Three times a day (TID) | ORAL | Status: DC | PRN

## 2015-09-01 MED ORDER — MELOXICAM 15 MG PO TABS: 15.0000 mg | ORAL_TABLET | Freq: Every day | ORAL | Status: DC

## 2015-09-01 NOTE — Progress Notes (Addendum)
Subjective:  By signing my name below, I, Brittany [name], attest that this documentation has been prepared under the direction and in the presence of Norberto Sorenson, MD.  [name], Medical Scribe. 09/01/2015.  10:05 AM.    Patient ID: Brittany [name], female    DOB: 1961/04/07, 54 y.o.   MRN: [id number]  Chief Complaint  Patient presents with  . Arm Pain    Right arm,     HPI HPI Comments: Brittany [name] is a 54 y.o. female who presents to [facility] complaining of right arm pain, gradual onset 2 months ago.  I saw pt 6 weeks prior to her physical. At that point she was saying that she h a flu shot at her right arm at work 1 month prior, and she has been experiencing arm pain since then. She is also on pravastatin 80, which we have increased this past summer. She is smoking. She does have essential tremor for which she takes tramadone at night. In 12/2012, Pt had C4, C5, and C6 anterior cervical discectomy fusion by Dr. Peggye Ley.  Today, pt reports that her arm pain is on the inner surface of the right arm, right above the triceps. She reports that the pain from the flu shot, which still has not resolved, was on the outer surface of the arm. Pt states that putting her arm at high angle, she experiences muscles tightness that she needs to bring the arm down. She indicates that the pain is as if a knife tip is poking on her. She reports using heat/cold packs, flexeril with very mild relief, and aleve with no relief. She denies skin changes, rashes, weakness or numbness of the hand, shoulder or neck pain.       Patient Active Problem List   Diagnosis Date Noted  . Left sided lacunar infarction (HCC) 03/11/2014  . Cerebrovascular small vessel disease 03/11/2014  . Insomnia, idiopathic 12/20/2013  . Snoring 12/06/2013  . Tobacco use disorder 01/22/2013  . Essential hypertension, benign 10/26/2012  . Hyperlipidemia LDL goal <70 10/26/2012  . Polypharmacy  10/26/2012  . DM (diabetes mellitus) (HCC) 10/13/2011  . Tremor 10/13/2011  . GERD (gastroesophageal reflux disease) 10/13/2011  . Depression 10/13/2011   Past Medical History  Diagnosis Date  . Diabetes mellitus without complication (HCC)   . Depression    Past Surgical History  Procedure Laterality Date  . Cholecystectomy    . Spine surgery     No Known Allergies Prior to Admission medications   Medication Sig Start Date End Date Taking? Authorizing Provider  cholecalciferol (VITAMIN D) 1000 UNITS tablet Take 1,000 Units by mouth daily.   Yes Historical Provider, MD  linagliptin (TRADJENTA) 5 MG TABS tablet Take 1 tablet (5 mg total) by mouth daily. 07/21/15  Yes [name], MD  losartan (COZAAR) 50 MG tablet Take 1 tablet (50 mg total) by mouth daily. 07/21/15  Yes [name], MD  metFORMIN (GLUCOPHAGE) 1000 MG tablet Take 1 tablet (1,000 mg total) by mouth 2 (two) times daily with a meal. 07/21/15  Yes [name], MD  pravastatin (PRAVACHOL) 80 MG tablet Take 1 tablet (80 mg total) by mouth daily. 02/22/15  Yes [name], MD  primidone (MYSOLINE) 250 MG tablet Take 1 tablet (250 mg total) by mouth at bedtime. 07/21/15  Yes [name], MD  sertraline (ZOLOFT) 100 MG tablet Take 1 tablet (100 mg total) by mouth at bedtime.  02/17/15  Yes Sherren MochaEva N Shaw, MD  Insulin Pen Needle 32G X 4 MM MISC Use to inject medication daily. Dx code: E11.9 Patient not taking: Reported on 09/01/2015 10/26/14   Sherren MochaEva N Shaw, MD   Social History   Social History  . Marital Status: Married    Spouse Name: Jonny RuizJohn  . Number of Children: 2  . Years of Education: 14   Occupational History  . Not on file.   Social History Main Topics  . Smoking status: Current Every Day Smoker -- 1.00 packs/day for 2 years    Types: Cigarettes  . Smokeless tobacco: Never Used  . Alcohol Use: Yes     Comment: rarely  . Drug Use: No  . Sexual Activity: No   Other Topics Concern  . Not on file   Social History Narrative     Patient is married ([name]) and lives at home with her family   Patient has two children.   Patient works at [employer].   Patient has a college education.   Patient is right-handed.   Patient drinks four cups of coffee M-F.         [facility] 2/9 07/20/2015 02/17/2015 10/14/2014 12/03/2013  Decreased Interest 0 0 1 1  Down, Depressed, Hopeless 0 0 2 0  PHQ - 2 Score 0 0 3 1  Altered sleeping - - 0 -  Tired, decreased energy - - 0 -  Change in appetite - - 0 -  Feeling bad or failure about yourself  - - 2 -  Trouble concentrating - - 0 -  Moving slowly or fidgety/restless - - 2 -  Suicidal thoughts - - 0 -  PHQ-9 Score - - 7 -     Review of Systems  Constitutional: Negative for fever, activity change, appetite change and unexpected weight change.  Gastrointestinal: Positive for abdominal pain. Negative for nausea and vomiting.  Musculoskeletal: Positive for myalgias. Negative for back pain, joint swelling, arthralgias, gait problem, neck pain and neck stiffness.  Skin: Negative for color change, rash and wound.  Neurological: Positive for weakness. Negative for numbness.  Hematological: Negative for adenopathy. Does not bruise/bleed easily.  Psychiatric/Behavioral: Positive for sleep disturbance. The patient is not nervous/anxious.       Objective:   Physical Exam  Constitutional: She is oriented to person, place, and time. She appears well-developed and well-nourished. No distress.  HENT:  Head: Normocephalic and atraumatic.  Eyes: EOM are normal. Pupils are equal, round, and reactive to light.  Neck: Neck supple.  Normal cervical ROM.   Cardiovascular: Normal rate.   Pulmonary/Chest: Effort normal.  Musculoskeletal:  Right arm: 5/5 wrist flexion and extension strength, but pain with resisted wrist extension.  4+/5 on th tricpes. 5/5 of the biceps. 4+/5 on the right deltoid. 5/5 opposition.   Neurological: She is alert and oriented to person, place,  and time. No cranial nerve deficit.  Reflex Scores:      Tricep reflexes are 2+ on the left side.      Bicep reflexes are 2+ on the right side and 2+ on the left side.      Brachioradialis reflexes are 2+ on the right side and 2+ on the left side. unable to elicit the triceps reflex on the right.   Skin: Skin is warm and dry.  Psychiatric: She has a normal mood and affect. Her behavior is normal.  Nursing note and vitals reviewed.   [facility] (PRIMARY) x-ray report read  by Dr. Norberto Sorenson, MD: hardware from prior C-spine surgery appears to be in place C4-C6. Diffuse degenerative disc disc, no acute change.     BP 118/70 mmHg  Pulse 93  Temp(Src) 98.4 F (36.9 C) (Oral)  Resp 16  Ht 5' 4.5" (1.638 m)  Wt 166 lb 6.4 oz (75.479 kg)  BMI 28.13 kg/m2  SpO2 99%      Assessment & Plan:   1. Musculoskeletal pain of right upper extremity   2. DDD (degenerative disc disease), cervical   This could be an atypical c7 radiculopathy vs tricep or bicep tendonitis/strain. Pt has had c4-6 anterior fusion in 12/2010 by Dr. Lovell Sheehan - now at [facility] so will refer back to him to see if this might be from impingement of right spinal nerve. However, as pt has had worsening symptoms failing conservative management with rest and nsaids for 10 wks now, will also ask ortho to see pt as they maybe able to get pt seen in a much more timely manner and will be able to identify the etiology of her sxs from her exam and then get her in to appropriate PT so she can become functional prior to the impending birth of her twin grandchildren.  Stop otc nsaids due to gastritis, try meloxicam, otc ppi for sev wks. Ok to try flexeril qhs and few hydrocodone for breakthrough pain. Try to avoid oral steroid course due to DM but if sxs are worsening prior to specialty eval, pt instructed to call so we can try a low dose.  Encouraged smoking cessation   Orders Placed This Encounter  Procedures  . DG Cervical Spine 2  or 3 views    Standing Status: Future     Number of Occurrences: 1     Standing Expiration Date: 08/31/2016    Order Specific Question:  Reason for Exam (SYMPTOM  OR DIAGNOSIS REQUIRED)    Answer:  possible right radiculopathy, concern for c7 impingement    Order Specific Question:  Is the patient pregnant?    Answer:  No    Order Specific Question:  Preferred imaging location?    Answer:  External  . Ambulatory referral to Orthopedic Surgery    Referral Priority:  Routine    Referral Type:  Surgical    Referral Reason:  Specialty Services Required    Requested Specialty:  Orthopedic Surgery    Number of Visits Requested:  1  . Ambulatory referral to Neurosurgery    Referral Priority:  Routine    Referral Type:  Surgical    Referral Reason:  Specialty Services Required    Referred to Provider:  Tressie Stalker, MD    Number of Visits Requested:  1    Meds ordered this encounter  Medications  . meloxicam (MOBIC) 15 MG tablet    Sig: Take 1 tablet (15 mg total) by mouth daily.    Dispense:  30 tablet    Refill:  1  . cyclobenzaprine (FLEXERIL) 10 MG tablet    Sig: Take 1 tablet (10 mg total) by mouth 3 (three) times daily as needed for muscle spasms.    Dispense:  30 tablet    Refill:  0  . HYDROcodone-acetaminophen (NORCO/VICODIN) 5-325 MG tablet    Sig: Take 1 tablet by mouth every 6 (six) hours as needed for moderate pain.    Dispense:  20 tablet    Refill:  0    I personally performed the services described in this documentation, which was  scribed in my presence. The recorded information has been reviewed and considered, and addended by me as needed.  Norberto Sorenson, MD MPH

## 2015-09-01 NOTE — Patient Instructions (Addendum)
Hold off on your pravastatin for a couple of weeks - want to make sure this isn't causing worsening symptoms and muscle weakness. Do not use with any other otc pain medication other than tylenol/acetaminophen - so no aleve, ibuprofen, motrin, advil, etc. While you are on the meloxicam. Take the meloxicam every morning Start a course of over-the-counter prilosec/protonix/nexium for several weeks. Can try the flexeril at night as well - if it is not helping then stop this. I do think there is a chance this could be coming from your neck so I have referred you back to who did your prior surgery Dr[facility] Tressie [facility]rey [facility] or there is a chance you could have a tendonitis on your tricep - if Neurosurgery is going to take a while to get you in I do think it is reasonable to get a ortho visit prior  If your symptoms worsen in the meantime, we can try a lower dose of prednisone but will try to hold off so we don't make your blood sugars worse.   Cervical Radiculopathy Cervical radiculopathy happens when a nerve in the neck (cervical nerve) is pinched or bruised. This condition can develop because of an injury or as part of the normal aging process. Pressure on the cervical nerves can cause pain or numbness that runs from the neck all the way down into the arm and fingers. Usually, this condition gets better with rest. Treatment may be needed if the condition does not improve.  CAUSES This condition may be caused by:  Injury.  Slipped (herniated) disk.  Muscle tightness in the neck because of overuse.  Arthritis.  Breakdown or degeneration in the bones and joints of the spine (spondylosis) due to aging.  Bone spurs that may develop near the cervical nerves. SYMPTOMS Symptoms of this condition include:  Pain that runs from the neck to the arm and hand. The pain can be severe or irritating. It may be worse when the neck is  moved.  Numbness or weakness in the affected arm and hand. DIAGNOSIS This condition may be diagnosed based on symptoms, medical history, and a physical exam. You may also have tests, including:  X-rays.  CT scan.  MRI.  Electromyogram (EMG).  Nerve conduction tests. TREATMENT In many cases, treatment is not needed for this condition. With rest, the condition usually gets better over time. If treatment is needed, options may include:  Wearing a soft neck collar for short periods of time.  Physical therapy to strengthen your neck muscles.  Medicines, such as NSAIDs, oral corticosteroids, or spinal injections.  Surgery. This may be needed if other treatments do not help. Various types of surgery may be done depending on the cause of your problems. HOME CARE INSTRUCTIONS Managing Pain  Take over-the-counter and prescription medicines only as told by your health care provider.  If directed, apply ice to the affected area.  Put ice in a plastic bag.  Place a towel between your skin and the bag.  Leave the ice on for 20 minutes, 2-3 times per day.  If ice does not help, you can try using heat. Take a warm shower or warm bath, or use a heat pack as told by your health care provider.  Try a gentle neck and shoulder massage to help relieve symptoms. Activity  Rest as needed. Follow instructions from your health care provider about any restrictions on activities.  Do stretching and strengthening exercises as told by  your health care provider or physical therapist. General Instructions  If you were given a soft collar, wear it as told by your health care provider.  Use a flat pillow when you sleep.  Keep all follow-up visits as told by your health care provider. This is important. SEEK MEDICAL CARE IF:  Your condition does not improve with treatment. SEEK IMMEDIATE MEDICAL CARE IF:  Your pain gets much worse and cannot be controlled with medicines.  You have weakness  or numbness in your hand, arm, face, or leg.  You have a high fever.  You have a stiff, rigid neck.  You lose control of your bowels or your bladder (have incontinence).  You have trouble with walking, balance, or speaking.   This information is not intended to replace advice given to you by your health care provider. Make sure you discuss any questions you have with your health care provider.   Document Released: 05/21/2001 Document Revised: 05/17/2015 Document Reviewed: 10/20/2014 Elsevier Interactive Patient Education [employer]

## 2015-09-08 ENCOUNTER — Other Ambulatory Visit: Payer: Self-pay | Admitting: Family Medicine

## 2015-09-20 ENCOUNTER — Other Ambulatory Visit: Payer: Self-pay | Admitting: Family Medicine

## 2015-09-22 ENCOUNTER — Other Ambulatory Visit: Payer: Self-pay | Admitting: Family Medicine

## 2015-11-15 NOTE — Progress Notes (Signed)
Subjective:    Patient ID: Brittany [name], female    DOB: 07-09-1961, 55 y.o.   MRN: [id number] Chief Complaint  Patient presents with  . Diabetes     HPI  Brittany [name] is here for a 4 mo f/u on her DM and chronic medical conditions.  DM: a1c was 6.8 -=> 7.1 at last visit so pt refocused on tlc.  Microabl done 07/2015 and was nml.  On metformin, tradjenta and victoza.  Minimize nsaid use. Got flu shot at work..  Not checking sugar, no lows.  HTN: on cozaar 50  HPL: on pravastatin 80 - LDL was at  goal of <70when last checked 4 mos prior, lipitor caused mental confusion  Tobacco use still ongoing - did she get lung cancer screening CT?  She has twin grandchildren.  Has NOT retired. Vitamin D nml at 32 0 mos prior and is taking an otc a day    Depression screen [facility] 2/9 11/16/2015 07/20/2015 02/17/2015 10/14/2014 12/03/2013  Decreased Interest 0 0 0 1 1  Down, Depressed, Hopeless 0 0 0 2 0  PHQ - 2 Score 0 0 0 3 1  Altered sleeping - - - 0 -  Tired, decreased energy - - - 0 -  Change in appetite - - - 0 -  Feeling bad or failure about yourself  - - - 2 -  Trouble concentrating - - - 0 -  Moving slowly or fidgety/restless - - - 2 -  Suicidal thoughts - - - 0 -  PHQ-9 Score - - - 7 -     Review of Systems  Constitutional: Positive for fatigue. Negative for fever, chills, diaphoresis, activity change and appetite change.  Eyes: Negative for visual disturbance.  Respiratory: Positive for cough. Negative for chest tightness, shortness of breath and wheezing.   Cardiovascular: Negative for chest pain, palpitations and leg swelling.  Genitourinary: Negative for decreased urine volume.  Musculoskeletal: Negative for myalgias, joint swelling, arthralgias and gait problem.  Neurological: Negative for syncope and headaches.  Hematological: Does not bruise/bleed easily.  Psychiatric/Behavioral: Negative for behavioral problems, sleep disturbance, dysphoric mood, decreased concentration and  agitation. The patient is not nervous/anxious.        Objective:  BP 124/79 mmHg  Pulse 99  Temp(Src) 98.3 F (36.8 C)  Resp 16  Ht  (1.651 m)  Wt 164 lb (74.39 kg)  BMI 27.29 kg/m2  Physical Exam  Constitutional: She is oriented to person, place, and time. She appears well-developed and well-nourished. No distress.  HENT:  Head: Normocephalic and atraumatic.  Right Ear: External ear normal.  Left Ear: External ear normal.  Eyes: Conjunctivae are normal. No scleral icterus.  Neck: Normal range of motion. Neck supple. No thyromegaly present.  Cardiovascular: Normal rate, regular rhythm, normal heart sounds and intact distal pulses.   Pulmonary/Chest: Effort normal and breath sounds normal. No respiratory distress.  Musculoskeletal: She exhibits no edema.  Lymphadenopathy:    She has no cervical adenopathy.  Neurological: She is alert and oriented to person, place, and time.  Skin: Skin is warm and dry. She is not diaphoretic. No erythema.  Psychiatric: She has a normal mood and affect. Her behavior is normal.          Results for orders placed or performed in visit on 11/16/15  POCT glycosylated hemoglobin (Hb A1C)  Result Value Ref Range   Hemoglobin A1C 8.1     Assessment & Plan:   1. Type 2  diabetes mellitus with hyperglycemia, without long-term current use of insulin (HCC)   2. Hyperlipidemia LDL goal <70   3. Tobacco use disorder     Orders Placed This Encounter  Procedures  . POCT glycosylated hemoglobin (Hb A1C)      Norberto SorensonEva Dorothie Wah, MD MPH

## 2015-11-16 ENCOUNTER — Encounter: Payer: Self-pay | Admitting: Family Medicine

## 2015-11-16 ENCOUNTER — Ambulatory Visit (INDEPENDENT_AMBULATORY_CARE_PROVIDER_SITE_OTHER): Payer: 59 | Admitting: Family Medicine

## 2015-11-16 VITALS — BP 124/79 | HR 99 | Temp 98.3°F | Resp 16 | Ht 65.0 in | Wt 164.0 lb

## 2015-11-16 DIAGNOSIS — E785 Hyperlipidemia, unspecified: Secondary | ICD-10-CM

## 2015-11-16 DIAGNOSIS — E1165 Type 2 diabetes mellitus with hyperglycemia: Secondary | ICD-10-CM

## 2015-11-16 DIAGNOSIS — F17200 Nicotine dependence, unspecified, uncomplicated: Secondary | ICD-10-CM

## 2015-11-16 LAB — POCT GLYCOSYLATED HEMOGLOBIN (HGB A1C): Hemoglobin A1C: 8.1

## 2015-11-16 NOTE — Patient Instructions (Signed)
[facility] is now offering annual lung cancer screening by low-dose CT scan.  This is covered for qualifying patients and your insurance will be checked before the procedure.  Call the lung cancer screening nurse navigators at [phone] to learn more about this and get scheduled.  

## 2015-12-13 ENCOUNTER — Emergency Department (HOSPITAL_COMMUNITY)
Admission: EM | Admit: 2015-12-13 | Discharge: 2015-12-13 | Disposition: A | Discharge status: Home / Self Care | Payer: 59 | Attending: Emergency Medicine | Admitting: Emergency Medicine

## 2015-12-13 ENCOUNTER — Emergency Department (HOSPITAL_COMMUNITY): Payer: 59

## 2015-12-13 ENCOUNTER — Encounter (HOSPITAL_COMMUNITY): Payer: Self-pay | Admitting: Emergency Medicine

## 2015-12-13 ENCOUNTER — Ambulatory Visit (INDEPENDENT_AMBULATORY_CARE_PROVIDER_SITE_OTHER): Payer: 59 | Admitting: Emergency Medicine

## 2015-12-13 VITALS — BP 120/80 | HR 90 | Temp 97.7°F | Resp 16 | Ht 64.0 in | Wt 164.0 lb

## 2015-12-13 DIAGNOSIS — E785 Hyperlipidemia, unspecified: Secondary | ICD-10-CM | POA: Diagnosis not present

## 2015-12-13 DIAGNOSIS — F17210 Nicotine dependence, cigarettes, uncomplicated: Secondary | ICD-10-CM | POA: Diagnosis not present

## 2015-12-13 DIAGNOSIS — I10 Essential (primary) hypertension: Secondary | ICD-10-CM | POA: Diagnosis not present

## 2015-12-13 DIAGNOSIS — R0789 Other chest pain: Secondary | ICD-10-CM | POA: Insufficient documentation

## 2015-12-13 DIAGNOSIS — R079 Chest pain, unspecified: Secondary | ICD-10-CM | POA: Diagnosis not present

## 2015-12-13 DIAGNOSIS — Z7982 Long term (current) use of aspirin: Secondary | ICD-10-CM | POA: Insufficient documentation

## 2015-12-13 DIAGNOSIS — Z79899 Other long term (current) drug therapy: Secondary | ICD-10-CM | POA: Diagnosis not present

## 2015-12-13 DIAGNOSIS — E119 Type 2 diabetes mellitus without complications: Secondary | ICD-10-CM | POA: Diagnosis not present

## 2015-12-13 DIAGNOSIS — Z7984 Long term (current) use of oral hypoglycemic drugs: Secondary | ICD-10-CM | POA: Insufficient documentation

## 2015-12-13 DIAGNOSIS — F329 Major depressive disorder, single episode, unspecified: Secondary | ICD-10-CM | POA: Insufficient documentation

## 2015-12-13 LAB — CBC WITH DIFFERENTIAL/PLATELET
BASOS ABS: 0 10*3/uL (ref 0.0–0.1)
Basophils Relative: 0 %
EOS PCT: 3 %
Eosinophils Absolute: 0.3 10*3/uL (ref 0.0–0.7)
HCT: 40.3 % (ref 36.0–46.0)
HEMOGLOBIN: 14.1 g/dL (ref 12.0–15.0)
LYMPHS ABS: 2.3 10*3/uL (ref 0.7–4.0)
LYMPHS PCT: 26 %
MCH: 31.4 pg (ref 26.0–34.0)
MCHC: 35 g/dL (ref 30.0–36.0)
MCV: 89.8 fL (ref 78.0–100.0)
Monocytes Absolute: 0.6 10*3/uL (ref 0.1–1.0)
Monocytes Relative: 6 %
NEUTROS ABS: 5.6 10*3/uL (ref 1.7–7.7)
NEUTROS PCT: 65 %
PLATELETS: 240 10*3/uL (ref 150–400)
RBC: 4.49 MIL/uL (ref 3.87–5.11)
RDW: 12.2 % (ref 11.5–15.5)
WBC: 8.8 10*3/uL (ref 4.0–10.5)

## 2015-12-13 LAB — HEPATIC FUNCTION PANEL
ALBUMIN: 3.7 g/dL (ref 3.5–5.0)
ALT: 21 U/L (ref 14–54)
AST: 23 U/L (ref 15–41)
Alkaline Phosphatase: 85 U/L (ref 38–126)
Bilirubin, Direct: 0.1 mg/dL (ref 0.1–0.5)
Indirect Bilirubin: 0.3 mg/dL (ref 0.3–0.9)
TOTAL PROTEIN: 6.8 g/dL (ref 6.5–8.1)
Total Bilirubin: 0.4 mg/dL (ref 0.3–1.2)

## 2015-12-13 LAB — BASIC METABOLIC PANEL
ANION GAP: 10 (ref 5–15)
BUN: 9 mg/dL (ref 6–20)
CHLORIDE: 107 mmol/L (ref 101–111)
CO2: 23 mmol/L (ref 22–32)
Calcium: 9.2 mg/dL (ref 8.9–10.3)
Creatinine, Ser: 0.58 mg/dL (ref 0.44–1.00)
Glucose, Bld: 156 mg/dL — ABNORMAL HIGH (ref 65–99)
POTASSIUM: 4.2 mmol/L (ref 3.5–5.1)
SODIUM: 140 mmol/L (ref 135–145)

## 2015-12-13 LAB — I-STAT TROPONIN, ED
TROPONIN I, POC: 0 ng/mL (ref 0.00–0.08)
Troponin i, poc: 0 ng/mL (ref 0.00–0.08)

## 2015-12-13 LAB — LIPASE, BLOOD: Lipase: 32 U/L (ref 11–51)

## 2015-12-13 LAB — CBG MONITORING, ED: GLUCOSE-CAPILLARY: 123 mg/dL — AB (ref 65–99)

## 2015-12-13 LAB — D-DIMER, QUANTITATIVE (NOT AT ARMC): D DIMER QUANT: 0.34 ug{FEU}/mL (ref 0.00–0.50)

## 2015-12-13 MED ORDER — ONDANSETRON HCL 4 MG/2ML IJ SOLN
4.0000 mg | Freq: Once | INTRAMUSCULAR | Status: AC
  Administered 2015-12-13: 4 mg via INTRAVENOUS
  Filled 2015-12-13: qty 2

## 2015-12-13 MED ORDER — ASPIRIN 81 MG PO CHEW
324.0000 mg | CHEWABLE_TABLET | Freq: Once | ORAL | Status: AC
  Administered 2015-12-13: 324 mg via ORAL

## 2015-12-13 MED ORDER — MORPHINE SULFATE (PF) 4 MG/ML IV SOLN
4.0000 mg | Freq: Once | INTRAVENOUS | Status: AC
  Administered 2015-12-13: 4 mg via INTRAVENOUS
  Filled 2015-12-13: qty 1

## 2015-12-13 NOTE — Patient Instructions (Signed)
     IF you received an x-ray today, you will receive an invoice from [facility]. Please contact [facility] at [phone] with questions or concerns regarding your invoice.   IF you received labwork today, you will receive an invoice from [facility]. Please contact [facility] at [phone] with questions or concerns regarding your invoice.   Our billing staff will not be able to assist you with questions regarding bills from these companies.  You will be contacted with the lab results as soon as they are available. The fastest way to get your results is to activate your My Chart account. Instructions are located on the last page of this paperwork. If you have not heard from us regarding the results in 2 weeks, please contact this office.      

## 2015-12-13 NOTE — Discharge Instructions (Signed)
Your labs and chest x-ray today were unremarkable. I do not think your chest pain is related to any emergent cause such as a heart attack or blood clot. Your pain could be related to acid reflux, stress/anxiety, or even musculoskeletal. Please follow up with your primary care provider as soon as possible for ongoing evaluation and management. Return to the ER for new or worsening symptoms.

## 2015-12-13 NOTE — Progress Notes (Signed)
   Subjective:    Patient ID: Brittany [name], female    DOB: 12/05/1960, 55 y.o.   MRN: [id number]  HPI This is a pleasant 55 yo female who presents today with chest pain that started yesterday afternoon. The pain is currently sharp, constant and radiates to her back. She has not had pain like this before. Pain better with lying on left side. Nothing unusual yesterday. Slept in a recliner last night. Smokes 1ppd.   Four days ago felt like her heart was "flickering," yesterday she was sitting at her computer at work and noticed pain. At 2 am today, she noticed more pain. Worst pain 8-9/10. Pain currently 7/10. No ,medication this morning.   Her mother has coronary stents and her father has a pacemaker. Father has history of MI.    Past Medical History  Diagnosis Date  . Diabetes mellitus without complication (HCC)   . Depression    Past Surgical History  Procedure Laterality Date  . Cholecystectomy    . Spine surgery     Family History  Problem Relation Age of Onset  . Cancer Mother   . Diabetes Mother   . Heart disease Mother   . Diabetes Father   . Heart disease Father   . Cancer Maternal Grandmother 62    lung  . COPD Maternal Aunt    Social History  Substance Use Topics  . Smoking status: Current Every Day Smoker -- 1.00 packs/day for 2 years    Types: Cigarettes  . Smokeless tobacco: Never Used  . Alcohol Use: Yes     Comment: rarely       Review of Systems No recent fever or cough, no SOB    Objective:   Physical Exam  Constitutional: She is oriented to person, place, and time. She appears well-developed and well-nourished. She appears ill. No distress.  HENT:  Head: Normocephalic and atraumatic.  Eyes: Conjunctivae are normal.  Neck: Normal range of motion. Neck supple.  Cardiovascular: Normal rate, regular rhythm and normal heart sounds.   Pulmonary/Chest: Effort normal and breath sounds normal. She exhibits no tenderness.  Neurological: She is alert  and oriented to person, place, and time.  Skin: Skin is warm and dry. She is not diaphoretic.  Psychiatric: She has a normal mood and affect. Her behavior is normal. Judgment and thought content normal.  Vitals reviewed.     BP 120/80 mmHg  Pulse 90  Temp(Src) 97.7 F (36.5 C) (Oral)  Resp 16  Ht 5\' 4"  (1.626 m)  Wt 164 lb (74.39 kg)  BMI 28.14 kg/m2  SpO2 98% Wt Readings from Last 3 Encounters:  12/13/15 164 lb (74.39 kg)  11/16/15 164 lb (74.39 kg)  09/01/15 166 lb 6.4 oz (75.479 kg)  EKG- SR with poor R-wave progression- non-specific, consider old infarct. HR 83.      Assessment & Plan:  Discussed with Dr. Cleta Albertsaub who reviewed EKG.  1. Chest pain, unspecified chest pain type - EKG 12-Lead - aspirin chewable tablet 324 mg; Chew 4 tablets (324 mg total) by mouth once. - [facility]   Olean Reeeborah Gunda Maqueda, FNP-BC  [facility]  12/13/2015 9:10 AM

## 2015-12-13 NOTE — ED Notes (Signed)
[facility] ER [facility] to substernal chest pressure radiating to both shoulder blades. Denies shortness of breath, nausea, vomiting, diaphoresis, etc. Per EMS EKG unremarkable. Received 324 mg aspirin but would not take any nitroglycerin. Pain is intermittent in nature, began on Sunday. Rates pressure 7/10 at this time. VSS.

## 2015-12-13 NOTE — ED Provider Notes (Signed)
CSN: [id number]     Arrival date & time 12/13/15  0941 History   First MD Initiated Contact with Patient 12/13/15 1000     Chief Complaint  Patient presents with  . Chest Pain    HPI   Brittany [name] is an 55 y.o. female with a history of DM, HTN, HLD who presents to the ED for evaluation of chest pain. She states the pain began on 4/2 while she was resting at home watching TV. She states at first the pain was constant but now is intermittent and positional. She states it is worse sitting up and better reclined. She describes the pain as a sharp substernal pain that radiates straight through to her back. Denies SOB, diaphoresis, n/v, abdominal pain, weakness, fever, chills. She has not tried anything to alleviate her symptoms. She states the only time she felt something similar to her current presentation she was found to have acute cholecystitis (now s/p cholecystectomy). She does endorse smoking 1 PPD. Denies h/o blood clots, recent travel, malignancy. Endorses fam hx of MI in her father and states her mother had stents placed a few years ago.   Past Medical History  Diagnosis Date  . Diabetes mellitus without complication (HCC)   . Depression    Past Surgical History  Procedure Laterality Date  . Cholecystectomy    . Spine surgery     Family History  Problem Relation Age of Onset  . Cancer Mother   . Diabetes Mother   . Heart disease Mother   . Diabetes Father   . Heart disease Father   . Cancer Maternal Grandmother 62    lung  . COPD Maternal Aunt    Social History  Substance Use Topics  . Smoking status: Current Every Day Smoker -- 1.00 packs/day for 2 years    Types: Cigarettes  . Smokeless tobacco: Never Used  . Alcohol Use: Yes     Comment: rarely   OB History    No data available     Review of Systems  All other systems reviewed and are negative.     Allergies  Review of patient's allergies indicates no known allergies.  Home Medications   Prior to  Admission medications   Medication Sig Start Date End Date Taking? Authorizing Provider  aspirin 325 MG tablet Take 325 mg by mouth daily.   Yes Historical Provider, MD  cholecalciferol (VITAMIN D) 1000 UNITS tablet Take 1,000 Units by mouth daily.   Yes Historical Provider, MD  cyclobenzaprine (FLEXERIL) 10 MG tablet Take 1 tablet (10 mg total) by mouth 3 (three) times daily as needed for muscle spasms. 09/01/15  Yes [name], MD  HYDROcodone-acetaminophen (NORCO/VICODIN) 5-325 MG tablet Take 1 tablet by mouth every 6 (six) hours as needed for moderate pain. 09/01/15  Yes [name], MD  linagliptin (TRADJENTA) 5 MG TABS tablet Take 1 tablet (5 mg total) by mouth daily. 07/21/15  Yes [name], MD  losartan (COZAAR) 50 MG tablet Take 1 tablet (50 mg total) by mouth daily. 07/21/15  Yes [name], MD  meloxicam (MOBIC) 15 MG tablet Take 1 tablet (15 mg total) by mouth daily. 09/01/15  Yes [name], MD  metFORMIN (GLUCOPHAGE) 1000 MG tablet Take 1 tablet (1,000 mg total) by mouth 2 (two) times daily with a meal. 07/21/15  Yes [name], MD  pravastatin (PRAVACHOL) 80 MG tablet Take 1 tablet (80 mg total) by mouth daily. 02/22/15  Yes  [name], MD  primidone (MYSOLINE) 250 MG tablet Take 1 tablet (250 mg total) by mouth at bedtime. 07/21/15  Yes [name], MD  sertraline (ZOLOFT) 100 MG tablet Take 1 tablet (100 mg total) by mouth at bedtime. Patient taking differently: Take 200 mg by mouth at bedtime.  02/17/15  Yes [name], MD  VICTOZA 18 MG/3ML SOPN INJECT 0.2 MLS (1.2 MG TOTAL) INTO THE SKIN DAILY. START 0.6 MG SUBCUTANEOUSLY  DAILY FOR  1 WEEK,  THEN 1.2 MG  DAILY AS DIRECTED 09/10/15  Yes [name], MD   BP 138/78 mmHg  Pulse 73  Temp(Src) 97.8 F (36.6 C) (Oral)  Resp 20  Wt 74.481 kg  SpO2 98% Physical Exam  Constitutional: She is oriented to person, place, and time. No distress.  HENT:  Right Ear: External ear normal.  Left Ear: External ear normal.  Nose: Nose normal.   Mouth/Throat: Oropharynx is clear and moist. No oropharyngeal exudate.  Eyes: Conjunctivae and EOM are normal. Pupils are equal, round, and reactive to light.  Neck: Normal range of motion. Neck supple.  Cardiovascular: Normal rate, regular rhythm, normal heart sounds and intact distal pulses.   Pulmonary/Chest: Effort normal and breath sounds normal. No respiratory distress. She has no wheezes. She exhibits no tenderness.  Abdominal: Soft. Bowel sounds are normal. She exhibits no distension. There is no tenderness. There is no rebound and no guarding.  Musculoskeletal: She exhibits no edema.  Neurological: She is alert and oriented to person, place, and time. No cranial nerve deficit.  Skin: Skin is warm and dry. She is not diaphoretic.  Psychiatric: She has a normal mood and affect.  Nursing note and vitals reviewed.   ED Course  Procedures (including critical care time) Labs Review Labs Reviewed  BASIC METABOLIC PANEL - Abnormal; Notable for the following:    Glucose, Bld 156 (*)    All other components within normal limits  CBG MONITORING, ED - Abnormal; Notable for the following:    Glucose-Capillary 123 (*)    All other components within normal limits  CBC WITH DIFFERENTIAL/PLATELET  HEPATIC FUNCTION PANEL  LIPASE, BLOOD  D-DIMER, QUANTITATIVE (NOT AT [facility])  I-STAT TROPOININ, ED  I-STAT TROPOININ, ED    Imaging Review Dg Chest 2 View  12/13/2015  CLINICAL DATA:  Pt to ER via [facility] as transfer from [facility] due to substernal chest pressure radiating to both shoulder blades. Pain has been off and on x 4 days. Hx of diabetes. Smoker @ 1ppd x 2 yrs. EXAM: CHEST - 2 VIEW COMPARISON:  02/24/2004 FINDINGS: Lungs are clear. Heart size and mediastinal contours are within normal limits. No effusion. Cervical fixation hardware.  Surgical clips right upper abdomen. IMPRESSION: No acute cardiopulmonary disease. Electronically Signed   By: Corlis Leak M.D.   On: 12/13/2015 11:08   I have  personally reviewed and evaluated these images and lab results as part of my medical decision-making.   EKG Interpretation   Date/Time:  Wednesday December 13 2015 09:53:53 EDT Ventricular Rate:  76 PR Interval:  166 QRS Duration: 75 QT Interval:  380 QTC Calculation: 427 R Axis:   55 Text Interpretation:  Sinus rhythm EKG WITHIN NORMAL LIMITS Confirmed by  LITTLE MD, RACHEL ([id number]) on 12/13/2015 10:26:38 AM      MDM   Final diagnoses:  Atypical chest pain    Pt is an 55 y.o. female with multiple co-morbidities presenting to the ED from [facility] for evaluation of  chest pain since Sunday 4/2. EKG in NSR with no abnormalities. Will check cmp, cbc, lipase, trop. If trop is negative HEART score would be 3. Wells score 0 for PE. Dissection also remains on the differential. Will check d-dimer.   11:36 AM BMP, CBC, troponin negative. CXR negative. D-dimer still pending. Pt states now the pain in her back is worse. Will give pain meds.   1:18 PM pain is improved with pain meds. D-dimer negative. BP in bilateral arms same/similar (102/51 vs 101/69). No widened mediastinum on CXR. Low suspicion for aortic dissection. Will hold off on CT angio. Given risk factors will check delta troponin. If remains negative will d/c home with close pcp f/u.   Delta trop negative. Pt remains comfortable in the ED. Instructed to f/u with PCP for ongoing evaluation and management of atypical chest pain. ER return precautions given.  Carlene CoriaSerena Y Shylin Keizer, PA-C 12/13/15 1634  Laurence Spatesachel Morgan Little, MD 12/14/15 0800

## 2015-12-23 ENCOUNTER — Ambulatory Visit (INDEPENDENT_AMBULATORY_CARE_PROVIDER_SITE_OTHER): Payer: 59

## 2015-12-23 ENCOUNTER — Ambulatory Visit (INDEPENDENT_AMBULATORY_CARE_PROVIDER_SITE_OTHER): Payer: 59 | Admitting: Osteopathic Medicine

## 2015-12-23 VITALS — BP 128/82 | HR 90 | Temp 98.4°F | Resp 18 | Ht 64.0 in | Wt 163.4 lb

## 2015-12-23 DIAGNOSIS — R059 Cough, unspecified: Secondary | ICD-10-CM

## 2015-12-23 DIAGNOSIS — J209 Acute bronchitis, unspecified: Secondary | ICD-10-CM

## 2015-12-23 DIAGNOSIS — R911 Solitary pulmonary nodule: Secondary | ICD-10-CM

## 2015-12-23 DIAGNOSIS — Z720 Tobacco use: Secondary | ICD-10-CM

## 2015-12-23 DIAGNOSIS — R062 Wheezing: Secondary | ICD-10-CM | POA: Diagnosis not present

## 2015-12-23 DIAGNOSIS — R05 Cough: Secondary | ICD-10-CM

## 2015-12-23 MED ORDER — PREDNISONE 20 MG PO TABS: 20.0000 mg | ORAL_TABLET | Freq: Two times a day (BID) | ORAL | Status: DC

## 2015-12-23 MED ORDER — BENZONATATE 200 MG PO CAPS: 200.0000 mg | ORAL_CAPSULE | Freq: Three times a day (TID) | ORAL | Status: DC | PRN

## 2015-12-23 MED ORDER — IPRATROPIUM BROMIDE 0.02 % IN SOLN
0.5000 mg | Freq: Once | RESPIRATORY_TRACT | Status: AC
  Administered 2015-12-23: 0.5 mg via RESPIRATORY_TRACT

## 2015-12-23 MED ORDER — IPRATROPIUM-ALBUTEROL 0.5-2.5 (3) MG/3ML IN SOLN: 3.0000 mL | RESPIRATORY_TRACT | Status: DC | PRN

## 2015-12-23 MED ORDER — IPRATROPIUM-ALBUTEROL 0.5-2.5 (3) MG/3ML IN SOLN: 3.0000 mL | Freq: Once | RESPIRATORY_TRACT | Status: DC

## 2015-12-23 MED ORDER — SODIUM CHLORIDE 0.9 % IV SOLN: 80.0000 mg | Freq: Once | INTRAVENOUS | Status: DC

## 2015-12-23 MED ORDER — AMBULATORY NON FORMULARY MEDICATION: Status: DC

## 2015-12-23 MED ORDER — METHYLPREDNISOLONE ACETATE 40 MG/ML IJ SUSP
40.0000 mg | Freq: Once | INTRAMUSCULAR | Status: AC
  Administered 2015-12-23: 40 mg via INTRAMUSCULAR

## 2015-12-23 MED ORDER — ALBUTEROL SULFATE (2.5 MG/3ML) 0.083% IN NEBU
2.5000 mg | INHALATION_SOLUTION | Freq: Once | RESPIRATORY_TRACT | Status: AC
  Administered 2015-12-23: 2.5 mg via RESPIRATORY_TRACT

## 2015-12-23 NOTE — Progress Notes (Signed)
HPI: Brittany [name] is a 55 y.o. female [facility] for chief complaint of:  Chief Complaint  Patient presents with  . URI    x4 days, coughing and wheezing   ACUTE ILLNESS . Location: "respiratory" . Quality: coughing . Assoc signs/symptoms: see ROS . Duration: 4 days . Modifying factors: has tried the following OTC/Rx medications: Mucinex, Delsym . Context: (+) smoking hx, hasn't had a cigarette all week since started feeling sick. Was recently seen in ER for CP r/o, grandkids also sick recently with URI    Past medical, social and family history reviewed. Current medications and allergies reviewed.     Review of Systems: CONSTITUTIONAL: SUBJECTIVE fever/chills HEAD/EYES/EARS/NOSE/THROAT: no headache, no vision change or hearing change, no sore throat, (+) sinus drainage CARDIAC: No chest pain/pressure/palpitations RESPIRATORY: (+) cough, no shortness of breath GASTROINTESTINAL: no nausea, no vomiting, no abdominal pain, no diarrhea MUSCULOSKELETAL: (+) chronic myalgia/arthralgia   Exam:  BP 128/82 mmHg  Pulse 90  Temp(Src) 98.4 F (36.9 C) (Oral)  Resp 18  Ht 5\' 4"  (1.626 m)  Wt 163 lb 6.4 oz (74.118 kg)  BMI 28.03 kg/m2  SpO2 95% Constitutional: VSS, see above. General Appearance: alert, well-developed, well-nourished, NAD Eyes: Normal lids and conjunctive, non-icteric sclera, PERRLA Ears, Nose, Mouth, Throat: Normal external inspection ears/nares/mouth/lips/gums, normal TM, MMM;       posterior pharynx without erythema, without exudate Neck: No masses, trachea midline. normal lymph nodes Respiratory: Normal respiratory effort. Yes  wheeze/rhonchi/rales Cardiovascular: S1/S2 normal, no murmur/rub/gallop auscultated. RRR.    Dg Chest 2 View  12/23/2015  CLINICAL DATA:  55 year old female with cough and wheezing EXAM: CHEST  2 VIEW COMPARISON:  Prior chest x-ray 12/13/2015 FINDINGS: The lungs are clear and negative for focal airspace  consolidation or pulmonary edema. No pleural effusion or pneumothorax. Approximately 9 mm subpleural nodular opacity noted in the left lung apex. This has not been seen on prior imaging. Cardiac and mediastinal contours are within normal limits. No acute fracture or lytic or blastic osseous lesions. The visualized upper abdominal bowel gas pattern is unremarkable. Surgical clips in the right upper quadrant suggest prior cholecystectomy. Incompletely imaged C5-C7 anterior fixation. IMPRESSION: 1. No active cardiopulmonary disease. 2. Approximately 9 mm subpleural nodular opacity noted in the left lung apex. This has not been identified on prior studies possibly due to differences in patient positioning. Consider further evaluation (non emergent) with noncontrast CT scan of the chest to evaluate for underlying pulmonary nodule. Electronically Signed   By: Malachy MoanHeath  McCullough M.D.   On: 12/23/2015 11:11     CXR personally reviewed - no mass/inlfiltrate noted, ?mild lymphadenopathy  ASSESSMENT/PLAN:  Acute bronchitis, unspecified organism - Likely viral. Plan: DG Chest 2 View, steroid injection and breathing treatment in office, Rx nebulizer machine and DuoNebs, po Prednisone, Tessalon. No PNA on CXR, but call/RTC if worse or if any other concerns and at that point would consider antibiotics based on symptoms.  RTC advised for PFT/spirometry to eval for COPD vs asthma since she seems to have bronchitis issues a few times per year and hx smoking.   Cough - Plan: DG Chest 2 View, benzonatate (TESSALON) 200 MG capsule  Wheezes - Plan: DG Chest 2 View  Tobacco abuse  Lung Nodule - CT chest ordered, pt informed of XR results as above.    Return if symptoms worsen or fail to improve, and consider appt with PCP for lung function tests.

## 2015-12-23 NOTE — Patient Instructions (Addendum)
Acute bronchitis is usually due to viral illness, but can have more severe symptoms in people with other respiratory diseases such as asthma or smokers with risk of COPD.   You are encouraged to quit smoking! Most people will have coughing when quitting smoking as the lungs "wake up" and start to clear out the built-up tar and mucus - you can expect a lingering cough but it should get better and better over time - if you are concerned you might be sick (if you're coughing up significant amounts of mucus, having trouble breathing, or have fever or other concerns) you should seek medical care ASAP.   Once you are feeling a bit better, you are encouraged to come for a visit to get lung function tests done to further evaluate for asthma or COPD, these test can help [place] diagnose you accurately and tailor any medication treatment to your specific needs.   For home treatment: Start oral steroids tomorrow, use the nebulized medications as needed for cough/shortness of breath and these should help clear the lungs a bit, too. Use cough medicine as directed.   Radiologist did not see a pneumonia but they did note a small nodule (9 millimeters) in the Left Lung - they recommended that we schedule a non-emergent CT to get a better picture of this, and all smokers should receive routine Chest CT anyway for lung cancer screening anyway.     Acute Bronchitis Bronchitis is inflammation of the airways that extend from the windpipe into the lungs (bronchi). The inflammation often causes mucus to develop. This leads to a cough, which is the most common symptom of bronchitis.  In acute bronchitis, the condition usually develops suddenly and goes away over time, usually in a couple weeks. Smoking, allergies, and asthma can make bronchitis worse. Repeated episodes of bronchitis may cause further lung problems.  CAUSES Acute bronchitis is most often caused by the same virus that causes a cold. The virus can spread from  person to person (contagious) through coughing, sneezing, and touching contaminated objects. SIGNS AND SYMPTOMS   Cough.   Fever.   Coughing up mucus.   Body aches.   Chest congestion.   Chills.   Shortness of breath.   Sore throat.  DIAGNOSIS  Acute bronchitis is usually diagnosed through a physical exam. Your health care provider will also ask you questions about your medical history. Tests, such as chest X-rays, are sometimes done to rule out other conditions.  TREATMENT  Acute bronchitis usually goes away in a couple weeks. Oftentimes, no medical treatment is necessary. Medicines are sometimes given for relief of fever or cough. Antibiotic medicines are usually not needed but may be prescribed in certain situations. In some cases, an inhaler may be recommended to help reduce shortness of breath and control the cough. A cool mist vaporizer may also be used to help thin bronchial secretions and make it easier to clear the chest.  HOME CARE INSTRUCTIONS  Get plenty of rest.   Drink enough fluids to keep your urine clear or pale yellow (unless you have a medical condition that requires fluid restriction). Increasing fluids may help thin your respiratory secretions (sputum) and reduce chest congestion, and it will prevent dehydration.   Take medicines only as directed by your health care provider.  If you were prescribed an antibiotic medicine, finish it all even if you start to feel better.  Avoid smoking and secondhand smoke. Exposure to cigarette smoke or irritating chemicals will make bronchitis worse. If  you are a smoker, consider using nicotine gum or skin patches to help control withdrawal symptoms. Quitting smoking will help your lungs heal faster.   Reduce the chances of another bout of acute bronchitis by washing your hands frequently, avoiding people with cold symptoms, and trying not to touch your hands to your mouth, nose, or eyes.   Keep all follow-up  visits as directed by your health care provider.  SEEK MEDICAL CARE IF: Your symptoms do not improve after 1 week of treatment.  SEEK IMMEDIATE MEDICAL CARE IF:  You develop an increased fever or chills.   You have chest pain.   You have severe shortness of breath.  You have bloody sputum.   You develop dehydration.  You faint or repeatedly feel like you are going to pass out.  You develop repeated vomiting.  You develop a severe headache. MAKE SURE YOU:   Understand these instructions.  Will watch your condition.  Will get help right away if you are not doing well or get worse.   This information is not intended to replace advice given to you by your health care provider. Make sure you discuss any questions you have with your health care provider.   Document Released: 10/03/2004 Document Revised: 09/16/2014 Document Reviewed: 02/16/2013 Elsevier Interactive Patient Education 2016 [employer]    IF you received an x-ray today, you will receive an invoice from [facility]. Please contact [facility] at [phone] with questions or concerns regarding your invoice.   IF you received labwork today, you will receive an invoice from [facility]. Please contact [facility] at [phone] with questions or concerns regarding your invoice.   Our billing staff will not be able to assist you with questions regarding bills from these companies.  You will be contacted with the lab results as soon as they are available. The fastest way to get your results is to activate your My Chart account. Instructions are located on the last page of this paperwork. If you have not heard from us regarding the results in 2 weeks, please contact this office.

## 2015-12-26 ENCOUNTER — Telehealth: Payer: Self-pay

## 2015-12-26 DIAGNOSIS — J209 Acute bronchitis, unspecified: Secondary | ICD-10-CM

## 2015-12-26 NOTE — Telephone Encounter (Signed)
I can send this to [facility] but I am afraid the diagnosis code will not work.for the nebulizer machine. Contacting Marchelle FolksAmanda

## 2015-12-26 NOTE — Telephone Encounter (Signed)
Patient is calling because she went to pick up the nebulizer machine and was told that the pharmacy doesn't carry them. Patient needs to know where she can pick it up from. Please advise! [phone]

## 2015-12-27 ENCOUNTER — Other Ambulatory Visit: Payer: Self-pay

## 2015-12-27 DIAGNOSIS — J410 Simple chronic bronchitis: Secondary | ICD-10-CM

## 2015-12-27 NOTE — Telephone Encounter (Signed)
Advised pt

## 2015-12-27 NOTE — Telephone Encounter (Signed)
Sent order so Marchelle Folksmanda should be contacting pt soon.

## 2015-12-28 ENCOUNTER — Other Ambulatory Visit: Payer: Self-pay | Admitting: Family Medicine

## 2015-12-29 NOTE — Telephone Encounter (Signed)
Dr Clelia CroftShaw, you saw pt for a check up last month, but don't see depression discussed recently. OK to RF?

## 2015-12-30 NOTE — Telephone Encounter (Signed)
Pt was on 100mg  zoloft so I guess she wants to back down to 75mg ?  Called pt to confirmed - left generic voicemail. Ok to refill and if the dose is wrong we can adjust

## 2016-01-02 ENCOUNTER — Ambulatory Visit (HOSPITAL_COMMUNITY)
Admission: RE | Admit: 2016-01-02 | Discharge: 2016-01-02 | Disposition: A | Discharge status: Home / Self Care | Payer: 59 | Source: Ambulatory Visit | Attending: Osteopathic Medicine | Admitting: Osteopathic Medicine

## 2016-01-02 ENCOUNTER — Encounter (HOSPITAL_COMMUNITY): Payer: Self-pay

## 2016-01-02 DIAGNOSIS — K449 Diaphragmatic hernia without obstruction or gangrene: Secondary | ICD-10-CM | POA: Insufficient documentation

## 2016-01-02 DIAGNOSIS — K760 Fatty (change of) liver, not elsewhere classified: Secondary | ICD-10-CM | POA: Insufficient documentation

## 2016-01-02 DIAGNOSIS — R918 Other nonspecific abnormal finding of lung field: Secondary | ICD-10-CM | POA: Insufficient documentation

## 2016-01-02 DIAGNOSIS — R911 Solitary pulmonary nodule: Secondary | ICD-10-CM

## 2016-01-02 MED ORDER — IOPAMIDOL (ISOVUE-300) INJECTION 61%
75.0000 mL | Freq: Once | INTRAVENOUS | Status: AC | PRN
  Administered 2016-01-02: 75 mL via INTRAVENOUS

## 2016-01-22 ENCOUNTER — Telehealth: Payer: Self-pay

## 2016-01-22 NOTE — Telephone Encounter (Signed)
Spoke with patient and informed her that we have fax order over to [facility] multiple times and each time they have not received it. I advise patient that I will refax order and follow back up on it later today. Per Marchelle FolksAmanda patient should be able to have neb today or tomorrow

## 2016-01-22 NOTE — Telephone Encounter (Signed)
Patient has not received a call regarding her nebulizer.   Please leave [phone]  Molli KnockOkay to leave message on voice mail.

## 2016-01-22 NOTE — Telephone Encounter (Signed)
Dr. Dareen PianoAnderson,  Marchelle FolksAmanda from [facility] called stating that insurance will not cover neb due to dx. Per Marchelle FolksAmanda patient would to have a hx of asthma. I informed patient and she would just like to wait on neb. Cash price is 90 dollars. Per pt she has some cough that comes and goes but no continuous.    Thanks   [employer] CoJocelyn

## 2016-03-17 ENCOUNTER — Other Ambulatory Visit: Payer: Self-pay | Admitting: Family Medicine

## 2016-03-27 ENCOUNTER — Other Ambulatory Visit: Payer: Self-pay | Admitting: Family Medicine

## 2016-03-29 DIAGNOSIS — E119 Type 2 diabetes mellitus without complications: Secondary | ICD-10-CM | POA: Diagnosis not present

## 2016-04-20 ENCOUNTER — Other Ambulatory Visit: Payer: Self-pay | Admitting: Family Medicine

## 2016-06-06 ENCOUNTER — Other Ambulatory Visit: Payer: Self-pay | Admitting: Family Medicine

## 2016-06-08 NOTE — Telephone Encounter (Signed)
Spoke with pt, didn't realize she needed appt in July. Refilled 1 pen.  Pt will call for appointment asap.

## 2016-07-13 ENCOUNTER — Ambulatory Visit (INDEPENDENT_AMBULATORY_CARE_PROVIDER_SITE_OTHER): Payer: BLUE CROSS/BLUE SHIELD | Admitting: Family Medicine

## 2016-07-13 VITALS — BP 120/66 | HR 84 | Temp 98.2°F | Resp 16 | Ht 63.25 in | Wt 156.2 lb

## 2016-07-13 DIAGNOSIS — I10 Essential (primary) hypertension: Secondary | ICD-10-CM

## 2016-07-13 DIAGNOSIS — Z818 Family history of other mental and behavioral disorders: Secondary | ICD-10-CM

## 2016-07-13 DIAGNOSIS — E1149 Type 2 diabetes mellitus with other diabetic neurological complication: Secondary | ICD-10-CM | POA: Diagnosis not present

## 2016-07-13 DIAGNOSIS — G250 Essential tremor: Secondary | ICD-10-CM

## 2016-07-13 DIAGNOSIS — J209 Acute bronchitis, unspecified: Secondary | ICD-10-CM | POA: Diagnosis not present

## 2016-07-13 DIAGNOSIS — I679 Cerebrovascular disease, unspecified: Secondary | ICD-10-CM

## 2016-07-13 DIAGNOSIS — Z820 Family history of epilepsy and other diseases of the nervous system: Secondary | ICD-10-CM

## 2016-07-13 DIAGNOSIS — F17200 Nicotine dependence, unspecified, uncomplicated: Secondary | ICD-10-CM | POA: Diagnosis not present

## 2016-07-13 DIAGNOSIS — E785 Hyperlipidemia, unspecified: Secondary | ICD-10-CM | POA: Diagnosis not present

## 2016-07-13 LAB — COMPREHENSIVE METABOLIC PANEL
ALT: 20 U/L (ref 6–29)
AST: 20 U/L (ref 10–35)
Albumin: 4.5 g/dL (ref 3.6–5.1)
Alkaline Phosphatase: 103 U/L (ref 33–130)
BUN: 13 mg/dL (ref 7–25)
CALCIUM: 9.9 mg/dL (ref 8.6–10.4)
CHLORIDE: 104 mmol/L (ref 98–110)
CO2: 22 mmol/L (ref 20–31)
Creat: 0.59 mg/dL (ref 0.50–1.05)
GLUCOSE: 243 mg/dL — AB (ref 65–99)
POTASSIUM: 4.6 mmol/L (ref 3.5–5.3)
Sodium: 137 mmol/L (ref 135–146)
Total Bilirubin: 0.5 mg/dL (ref 0.2–1.2)
Total Protein: 7.2 g/dL (ref 6.1–8.1)

## 2016-07-13 LAB — POCT CBC
Granulocyte percent: 64.8 %G (ref 37–80)
HEMATOCRIT: 44.7 % (ref 37.7–47.9)
HEMOGLOBIN: 16.3 g/dL — AB (ref 12.2–16.2)
LYMPH, POC: 2.5 (ref 0.6–3.4)
MCH, POC: 32.1 pg — AB (ref 27–31.2)
MCHC: 36.6 g/dL — AB (ref 31.8–35.4)
MCV: 87.8 fL (ref 80–97)
MID (cbc): 0.4 (ref 0–0.9)
MPV: 7.4 fL (ref 0–99.8)
POC GRANULOCYTE: 5.4 (ref 2–6.9)
POC LYMPH PERCENT: 30.5 %L (ref 10–50)
POC MID %: 4.7 %M (ref 0–12)
Platelet Count, POC: 228 10*3/uL (ref 142–424)
RBC: 5.09 M/uL (ref 4.04–5.48)
RDW, POC: 11.9 %
WBC: 8.3 10*3/uL (ref 4.6–10.2)

## 2016-07-13 LAB — LIPID PANEL
CHOL/HDL RATIO: 3 ratio (ref <=5.0)
Cholesterol: 153 mg/dL (ref 125–200)
HDL: 51 mg/dL (ref >=46)
LDL CALC: 81 mg/dL (ref <130)
Triglycerides: 106 mg/dL (ref <150)
VLDL: 21 mg/dL (ref <30)

## 2016-07-13 LAB — MICROALBUMIN / CREATININE URINE RATIO
CREATININE, URINE: 146 mg/dL (ref 20–320)
Microalb Creat Ratio: 11 mcg/mg creat (ref <30)
Microalb, Ur: 1.6 mg/dL

## 2016-07-13 LAB — POCT GLYCOSYLATED HEMOGLOBIN (HGB A1C): Hemoglobin A1C: 9.8

## 2016-07-13 MED ORDER — PRIMIDONE 250 MG PO TABS: 250.0000 mg | ORAL_TABLET | Freq: Every day | ORAL | 3 refills | Status: DC

## 2016-07-13 MED ORDER — IPRATROPIUM BROMIDE 0.02 % IN SOLN
0.5000 mg | Freq: Once | RESPIRATORY_TRACT | Status: AC
  Administered 2016-07-13: 0.5 mg via RESPIRATORY_TRACT

## 2016-07-13 MED ORDER — BENZONATATE 200 MG PO CAPS: 200.0000 mg | ORAL_CAPSULE | Freq: Three times a day (TID) | ORAL | 0 refills | Status: DC | PRN

## 2016-07-13 MED ORDER — PRAVASTATIN SODIUM 80 MG PO TABS: 80.0000 mg | ORAL_TABLET | Freq: Every day | ORAL | 3 refills | Status: DC

## 2016-07-13 MED ORDER — ALBUTEROL SULFATE 108 (90 BASE) MCG/ACT IN AEPB: 2.0000 | INHALATION_SPRAY | RESPIRATORY_TRACT | 2 refills | Status: DC | PRN

## 2016-07-13 MED ORDER — ALBUTEROL SULFATE (2.5 MG/3ML) 0.083% IN NEBU
2.5000 mg | INHALATION_SOLUTION | Freq: Once | RESPIRATORY_TRACT | Status: AC
  Administered 2016-07-13: 2.5 mg via RESPIRATORY_TRACT

## 2016-07-13 MED ORDER — METFORMIN HCL ER 500 MG PO TB24: 2000.0000 mg | ORAL_TABLET | Freq: Every day | ORAL | 1 refills | Status: DC

## 2016-07-13 MED ORDER — LINAGLIPTIN 5 MG PO TABS: 5.0000 mg | ORAL_TABLET | Freq: Every day | ORAL | 1 refills | Status: DC

## 2016-07-13 MED ORDER — LIRAGLUTIDE 18 MG/3ML ~~LOC~~ SOPN: 1.8000 mg | PEN_INJECTOR | Freq: Every day | SUBCUTANEOUS | 1 refills | Status: DC

## 2016-07-13 MED ORDER — SERTRALINE HCL 100 MG PO TABS: 200.0000 mg | ORAL_TABLET | Freq: Every day | ORAL | 3 refills | Status: DC

## 2016-07-13 MED ORDER — LOSARTAN POTASSIUM 50 MG PO TABS: 50.0000 mg | ORAL_TABLET | Freq: Every day | ORAL | 3 refills | Status: DC

## 2016-07-13 MED ORDER — ALBUTEROL SULFATE (2.5 MG/3ML) 0.083% IN NEBU: 2.5000 mg | INHALATION_SOLUTION | Freq: Four times a day (QID) | RESPIRATORY_TRACT | 1 refills | Status: DC | PRN

## 2016-07-13 NOTE — Progress Notes (Addendum)
Subjective:  By signing my name below, I, [name], attest that this documentation has been prepared under the direction and in the presence of Norberto Sorenson, MD.  Electronically Signed: Andrew Au, ED Scribe. 07/13/2016. 10:58 AM.   Patient ID: Brittany [name], female    DOB: 1960/10/09, 55 y.o.   MRN: [id number]  HPI   Chief Complaint  Patient presents with  . fasting lab    Glucose   HPI Comments: Brittany [name] is a 55 y.o. female who presents to the [facility] for a follow up. She has not been checking sugars outside of office. She has been tolerating medication well. She denies symptomatic lows.  She has been seen by an optometrist in the past year. She smokes a pack a day.  She's had a dry, hacking cough for about 1 weeks, initially started with post nasal drip. Cough doesn't keep her awake at night. She's tried mucinex.     Patient Active Problem List   Diagnosis Date Noted  . Left sided lacunar infarction (HCC) 03/11/2014  . Cerebrovascular small vessel disease 03/11/2014  . Insomnia, idiopathic 12/20/2013  . Snoring 12/06/2013  . Tobacco use disorder 01/22/2013  . Essential hypertension, benign 10/26/2012  . Hyperlipidemia LDL goal <70 10/26/2012  . Polypharmacy 10/26/2012  . DM (diabetes mellitus) (HCC) 10/13/2011  . Tremor 10/13/2011  . GERD (gastroesophageal reflux disease) 10/13/2011  . Depression 10/13/2011   Past Medical History:  Diagnosis Date  . Depression   . Diabetes mellitus without complication [facility]    Past Surgical History:  Procedure Laterality Date  . CHOLECYSTECTOMY    . SPINE SURGERY     No Known Allergies Prior to Admission medications   Medication Sig Start Date End Date Taking? Authorizing Provider  AMBULATORY NON FORMULARY MEDICATION Supply name: generic nebulizer machine and supplies including hoses, mouth piece 12/23/15  Yes Sunnie Nielsen, DO  BD PEN NEEDLE NANO U/F 32G X 4 MM MISC USE WITH VICTOZA PEN 12/29/15   Yes [name], MD  cyclobenzaprine (FLEXERIL) 10 MG tablet Take 1 tablet (10 mg total) by mouth 3 (three) times daily as needed for muscle spasms. 09/01/15  Yes [name], MD  HYDROcodone-acetaminophen (NORCO/VICODIN) 5-325 MG tablet Take 1 tablet by mouth every 6 (six) hours as needed for moderate pain. 09/01/15  Yes [name], MD  linagliptin (TRADJENTA) 5 MG TABS tablet Take 1 tablet (5 mg total) by mouth daily. 07/21/15  Yes [name], MD  losartan (COZAAR) 50 MG tablet Take 1 tablet (50 mg total) by mouth daily. 07/21/15  Yes [name], MD  meloxicam (MOBIC) 15 MG tablet Take 1 tablet (15 mg total) by mouth daily. 09/01/15  Yes [name], MD  metFORMIN (GLUCOPHAGE) 1000 MG tablet Take 1 tablet (1,000 mg total) by mouth 2 (two) times daily with a meal. 07/21/15  Yes [name], MD  pravastatin (PRAVACHOL) 80 MG tablet TAKE 1 TABLET (80 MG TOTAL) BY MOUTH DAILY. 03/18/16  Yes [name], MD  predniSONE (DELTASONE) 20 MG tablet Take 1 tablet (20 mg total) by mouth 2 (two) times daily with a meal. 12/23/15  Yes Sunnie Nielsen, DO  primidone (MYSOLINE) 250 MG tablet Take 1 tablet (250 mg total) by mouth at bedtime. 07/21/15  Yes [name], MD  sertraline (ZOLOFT) 100 MG tablet Take 1 tablet (100 mg total) by mouth at bedtime. Patient taking differently: Take 200 mg by mouth  at bedtime.  02/17/15  Yes [name], MD  VICTOZA 18 MG/3ML SOPN INJECT 0.2 ML INTO THE SKIN DAILY. START WITH 0.1 ML DAILY FOR 1 WEEK, THEN INCREASE TO 0.2 ML DAILY AS DIRECTED. 06/08/16  Yes [name], MD  aspirin 325 MG tablet Take 325 mg by mouth daily.    Historical Provider, MD  cholecalciferol (VITAMIN D) 1000 UNITS tablet Take 1,000 Units by mouth daily.    Historical Provider, MD  ipratropium-albuterol (DUONEB) 0.5-2.5 (3) MG/3ML SOLN Take 3 mLs by nebulization every 2 (two) hours as needed (wheeze, SOB). Patient not taking: Reported on 07/13/2016 12/23/15   Sunnie Nielsen, DO  sertraline (ZOLOFT) 50 MG tablet  TAKE 1.5 TABLETS (75 MG TOTAL) BY MOUTH AT BEDTIME. Patient not taking: Reported on 07/13/2016 12/30/15   [name], MD   Social History   Social History  . Marital status: Married    Spouse name: [name]  . Number of children: 2  . Years of education: 14   Occupational History  . Not on file.   Social History Main Topics  . Smoking status: Current Every Day Smoker    Packs/day: 1.00    Years: 2.00    Types: Cigarettes  . Smokeless tobacco: Never Used  . Alcohol use Yes     Comment: rarely  . Drug use: No  . Sexual activity: No   Other Topics Concern  . Not on file   Social History Narrative   Patient is married ([name]) and lives at home with her family   Patient has two children.   Patient works at [employer].   Patient has a college education.   Patient is right-handed.   Patient drinks four cups of coffee M-F.         Depression screen [facility] 2/9 07/13/2016 12/23/2015 12/13/2015 11/16/2015 07/20/2015  Decreased Interest 1 0 0 0 0  Down, Depressed, Hopeless 1 0 0 0 0  PHQ - 2 Score 2 0 0 0 0  Altered sleeping 0 - - - -  Tired, decreased energy 0 - - - -  Change in appetite 1 - - - -  Feeling bad or failure about yourself  0 - - - -  Trouble concentrating 0 - - - -  Moving slowly or fidgety/restless 0 - - - -  Suicidal thoughts 0 - - - -  PHQ-9 Score 3 - - - -  Difficult doing work/chores Not difficult at all - - - -     Review of Systems  Constitutional: Negative for activity change, appetite change, chills, fever and unexpected weight change.  HENT: Positive for congestion and postnasal drip. Negative for sore throat and trouble swallowing.   Respiratory: Positive for cough and shortness of breath. Negative for apnea, chest tightness and wheezing.   Cardiovascular: Negative for chest pain, palpitations and leg swelling.  Neurological: Positive for tremors. Negative for dizziness, syncope, speech difficulty and light-headedness.  Psychiatric/Behavioral:  Positive for dysphoric mood. Negative for confusion and sleep disturbance. The patient is not nervous/anxious.        Objective:   Physical Exam  Constitutional: She is oriented to person, place, and time. She appears well-developed and well-nourished. No distress.  HENT:  Right Ear: A middle ear effusion is present.  Left Ear: Tympanic membrane is retracted.  Nose: Nose normal.  Mouth/Throat: Uvula is midline, oropharynx is clear and moist and mucous membranes are normal.  Eyes: Conjunctivae are normal.  Neck: Neck  supple. No thyromegaly present.  Cardiovascular: Normal rate, regular rhythm, S1 normal, S2 normal and normal heart sounds.   No murmur heard. Pulmonary/Chest: Effort normal and breath sounds normal. No respiratory distress. She has no wheezes. She has no rales.  Musculoskeletal: Normal range of motion.  Lymphadenopathy:       Head (right side): Submandibular adenopathy present.       Head (left side): Submandibular adenopathy present.    She has no cervical adenopathy.  Neurological: She is alert and oriented to person, place, and time.  Skin: Skin is warm and dry.  Psychiatric: Her speech is normal and behavior is normal. Her affect is blunt. She exhibits a depressed mood.  Nursing note and vitals reviewed.   Vitals:   07/13/16 1029  BP: 120/66  Pulse: 84  Resp: 16  Temp: 98.2 F (36.8 C)  TempSrc: Oral  SpO2: 97%  Weight: 156 lb 3.2 oz (70.9 kg)  Height: 5' 3.25" (1.607 m)    Results for orders placed or performed in visit on 07/13/16  POCT glycosylated hemoglobin (Hb A1C)  Result Value Ref Range   Hemoglobin A1C 9.8   POCT CBC  Result Value Ref Range   WBC 8.3 4.6 - 10.2 K/uL   Lymph, poc 2.5 0.6 - 3.4   POC LYMPH PERCENT 30.5 10 - 50 %L   MID (cbc) 0.4 0 - 0.9   POC MID % 4.7 0 - 12 %M   POC Granulocyte 5.4 2 - 6.9   Granulocyte percent 64.8 37 - 80 %G   RBC 5.09 4.04 - 5.48 M/uL   Hemoglobin 16.3 (A) 12.2 - 16.2 g/dL   HCT, POC 46.944.7 62.937.7 - 47.9  %   MCV 87.8 80 - 97 fL   MCH, POC 32.1 (A) 27 - 31.2 pg   MCHC 36.6 (A) 31.8 - 35.4 g/dL   RDW, POC 52.811.9 %   Platelet Count, POC 228 142 - 424 K/uL   MPV 7.4 0 - 99.8 fL   Office Spirometry Results: Peak Flow: 250 L/min (Pre nebulizer treatment 250)  Post neb peak flow 300  Assessment & Plan:   1. Type 2 diabetes mellitus with other neurologic complication, without long-term current use of insulin (HCC) - sig worse, cont tradjenta, increase victoza from 1.2 to 1.8. Change metformin to long acting to improve compliance. If not sig improved at f/u in 3-4 mos, refer to endocrine as maxed out on current regimen and may need to start insulin  2. Essential hypertension, benign - well controlled, refilled  3. Tobacco use disorder - 1 ppd, pt w/o desire to quit at this time  4. Hyperlipidemia LDL goal <70 - LDL above goal at 81, start pravastatin 80 and try lipitor 40  5. Cerebrovascular small vessel disease   6. Acute bronchitis, unspecified organism - pt is developing recurring URIs so is well on her way to chronic bronchitis, rec full PFTs at f/u.  Pt has been rec to have home nebulizer prior but never received.  She got symptomatic improvement after trx in office today and peak flow also did improve some so will reorder for home neb.  7. Essential tremor   8. Family history of Parkinson's disease - in pt's mother who has an appt sched with [facility] for further eval. Pt understandably concerned that she will also develop Parkinson's and/or other dementia esp as she already has a tremor (benign essential).  Will refer to to [facility] to discuss her concerns - would  be nice if she could get a back -to-back appt with her mother with the same provider since Ardra's concerns are triggered by her mother's diagnosis.  9. Family history of dementia - in pt's father    Orders Placed This Encounter  Procedures  . DME Nebulizer machine  . Comprehensive metabolic panel    Order Specific Question:   Has the  patient fasted?    Answer:   Yes  . Lipid panel    Order Specific Question:   Has the patient fasted?    Answer:   Yes  . Microalbumin/Creatinine Ratio, Urine  . Ambulatory referral to Neurology    Referral Priority:   Routine    Referral Type:   Consultation    Referral Reason:   Specialty Services Required    Requested Specialty:   Neurology    Number of Visits Requested:   1  . POCT glycosylated hemoglobin (Hb A1C)  . POCT CBC    Meds ordered this encounter  Medications  . benzonatate (TESSALON) 200 MG capsule    Sig: Take 1 capsule (200 mg total) by mouth 3 (three) times daily as needed for cough.    Dispense:  40 capsule    Refill:  0  . albuterol (PROVENTIL) (2.5 MG/3ML) 0.083% nebulizer solution 2.5 mg  . ipratropium (ATROVENT) nebulizer solution 0.5 mg  . sertraline (ZOLOFT) 100 MG tablet    Sig: Take 2 tablets (200 mg total) by mouth at bedtime.    Dispense:  180 tablet    Refill:  3  . primidone (MYSOLINE) 250 MG tablet    Sig: Take 1 tablet (250 mg total) by mouth at bedtime.    Dispense:  90 tablet    Refill:  3  . DISCONTD: pravastatin (PRAVACHOL) 80 MG tablet    Sig: Take 1 tablet (80 mg total) by mouth daily.    Dispense:  90 tablet    Refill:  3  . losartan (COZAAR) 50 MG tablet    Sig: Take 1 tablet (50 mg total) by mouth daily.    Dispense:  90 tablet    Refill:  3  . linagliptin (TRADJENTA) 5 MG TABS tablet    Sig: Take 1 tablet (5 mg total) by mouth daily.    Dispense:  90 tablet    Refill:  1  . liraglutide (VICTOZA) 18 MG/3ML SOPN    Sig: Inject 0.3 mLs (1.8 mg total) into the skin daily.    Dispense:  10 pen    Refill:  1  . metFORMIN (GLUCOPHAGE XR) 500 MG 24 hr tablet    Sig: Take 4 tablets (2,000 mg total) by mouth daily with breakfast.    Dispense:  360 tablet    Refill:  1  . albuterol (PROVENTIL) (2.5 MG/3ML) 0.083% nebulizer solution    Sig: Take 3 mLs (2.5 mg total) by nebulization every 6 (six) hours as needed for wheezing or  shortness of breath.    Dispense:  150 mL    Refill:  1  . Albuterol Sulfate (PROAIR RESPICLICK) 108 (90 Base) MCG/ACT AEPB    Sig: Inhale 2 puffs into the lungs every 4 (four) hours as needed.    Dispense:  1 each    Refill:  2  . atorvastatin (LIPITOR) 40 MG tablet    Sig: Take 1 tablet (40 mg total) by mouth daily.    Dispense:  90 tablet    Refill:  3    I personally performed the  services described in this documentation, which was scribed in my presence. The recorded information has been reviewed and considered, and addended by me as needed.   Norberto Sorenson, M.D.  [facility] [address] [place], [place] [address] [phone] phone [phone] fax  07/14/16 7:34 AM

## 2016-07-13 NOTE — Patient Instructions (Signed)
How to Avoid Diabetes Problems  You can do a lot to prevent or slow down diabetes problems. Following your diabetes plan and taking care of yourself can reduce your risk of serious or life-threatening complications. Below, you will find certain things you can do to prevent diabetes problems.  MANAGE YOUR DIABETES  Follow your health care provider's, nurse educator's, and dietitian's instructions for managing your diabetes. They will teach you the basics of diabetes care. They can help answer questions you may have. Learn about diabetes and make healthy choices regarding eating and physical activity. Monitor your blood glucose level regularly. Your health care provider will help you decide how often to check your blood glucose level depending on your treatment goals and how well you are meeting them.   DO NOT USE NICOTINE  Nicotine and diabetes are a dangerous combination. Nicotine raises your risk for diabetes problems. If you quit using nicotine, you will lower your risk for heart attack, stroke, nerve disease, and kidney disease. Your cholesterol and your blood pressure levels may improve. Your blood circulation will also improve. Do not use any tobacco products, including cigarettes, chewing tobacco, or electronic cigarettes. If you need help quitting, ask your health care provider.  KEEP YOUR BLOOD PRESSURE UNDER CONTROL  Your health care provider will determine your individualized target blood pressure based on your age, your medicines, how long you have had diabetes, and any other medical conditions you have. Blood pressure consists of two numbers. Generally, the goal is to keep your top number (systolic pressure) at or below 130, and your bottom number (diastolic pressure) at or below 80. Your health care provider may recommend a lower target blood pressure reading, if appropriate. Meal planning, medicines, and exercise can help you reach your target blood pressure. Make sure your health care provider checks  your blood pressure at every visit.  KEEP YOUR CHOLESTEROL UNDER CONTROL  Normal cholesterol levels will help prevent heart disease and stroke. These are the biggest health problems for people with diabetes. Keeping cholesterol levels under control can also help with blood flow. Have your cholesterol level checked at least once a year. Your health care provider may prescribe a medicine known as a statin. Statins lower your cholesterol. If you are not taking a statin, ask your health care provider if you should be. Meal planning, exercise, and medicines can help you reach your cholesterol targets.   SCHEDULE AND KEEP YOUR ANNUAL PHYSICAL EXAMS AND EYE EXAMS  Your health care provider will tell you how often he or she wants to see you depending on your plan of treatment. It is important that you keep these appointments so that possible problems can be identified early and complications can be avoided or treated.  · Every visit with your health care provider should include your weight, blood pressure, and an evaluation of your blood glucose control.  · Your hemoglobin A1c should be checked:    At least twice a year if you are at your goal.    Every 3 months if there are changes in treatment.    If you are not meeting your goals.  · Your blood lipids should be checked yearly. You should also be checked yearly to see if you have protein in your urine (microalbumin).  · Schedule a dilated eye exam within 5 years of your diagnosis if you have type 1 diabetes, and then yearly. Schedule a dilated eye exam at diagnosis if you have type 2 diabetes, and then yearly. All   exams thereafter can be extended to every 2 to 3 years if one or more exams have been normal.  KEEP YOUR VACCINES CURRENT  It is recommended that you receive a flu (influenza) vaccine every year. It is also recommended that you receive a pneumonia (pneumococcal) vaccine. If you are 65 years of age or older and have never received a pneumonia vaccine, this  vaccine may be given as a series of two separate shots. Ask your health care provider which additional vaccines may be recommended.  TAKE CARE OF YOUR FEET   Diabetes may cause you to have a poor blood supply (circulation) to your legs and feet. Because of this, the skin may be thinner, break easier, and heal more slowly. You also may have nerve damage in your legs and feet, causing decreased feeling. You may not notice minor injuries to your feet that could lead to serious problems or infections. Taking care of your feet is very important.  Visual foot exams are performed at every routine medical visit. The exams check for cuts, injuries, or other problems with the feet. A comprehensive foot exam should be done yearly. This includes visual inspection as well as assessing foot pulses and testing for loss of sensation. You should also do the following:  · Inspect your feet daily for cuts, calluses, blisters, ingrown toenails, and signs of infection, such as redness, swelling, or pus.  · Wash and dry your feet thoroughly, especially between the toes.  · Avoid soaking your feet regularly in hot water baths.  · Moisturize dry skin with lotion, avoiding areas between your toes.  · Cut toenails straight across and file the edges.  · Avoid shoes that do not fit well or have areas that irritate your skin.  · Avoid going barefooted or wearing only socks. Your feet need protection.  TAKE CARE OF YOUR TEETH  People with poorly controlled diabetes are more likely to have gum (periodontal) disease. These infections make diabetes harder to control. Periodontal diseases, if left untreated, can lead to tooth loss. Brush your teeth twice a day, floss, and see your dentist for checkups and cleaning every 6 months, or 2 times a year.  ASK YOUR HEALTH CARE PROVIDER ABOUT TAKING ASPIRIN  Taking aspirin daily is recommended to help prevent cardiovascular disease in people with and without diabetes. Ask your health care provider if this  would benefit you and what dose he or she would recommend.  DRINK RESPONSIBLY  Moderate amounts of alcohol (less than 1 drink per day for adult women and less than 2 drinks per day for adult men) have a minimal effect on blood glucose if ingested with food. It is important to eat food with alcohol to avoid hypoglycemia. People should avoid alcohol if they have a history of alcohol abuse or dependence, if they are pregnant, and if they have liver disease, pancreatitis, advanced neuropathy, or severe hypertriglyceridemia.  LESSEN STRESS  Living with diabetes can be stressful. When you are under stress, your blood glucose may be affected in two ways:  · Stress hormones may cause your blood glucose to rise.  · You may be distracted from taking good care of yourself.  It is a good idea to be aware of your stress level and make changes that are necessary to help you better manage challenging situations. Support groups, planned relaxation, a hobby you enjoy, meditation, healthy relationships, and exercise all work to lower your stress level. If your efforts do not seem to be helping,   get help from your health care provider or a trained mental health professional.     This information is not intended to replace advice given to you by your health care provider. Make sure you discuss any questions you have with your health care provider.     Document Released: 05/14/2011 Document Revised: 09/16/2014 Document Reviewed: 10/20/2013  Elsevier Interactive Patient Education ©2016 Elsevier Inc.

## 2016-07-14 ENCOUNTER — Encounter: Payer: Self-pay | Admitting: Family Medicine

## 2016-07-14 MED ORDER — ATORVASTATIN CALCIUM 40 MG PO TABS: 40.0000 mg | ORAL_TABLET | Freq: Every day | ORAL | 3 refills | Status: DC

## 2016-08-28 ENCOUNTER — Encounter: Payer: Self-pay | Admitting: Neurology

## 2016-08-28 ENCOUNTER — Ambulatory Visit (INDEPENDENT_AMBULATORY_CARE_PROVIDER_SITE_OTHER): Payer: BLUE CROSS/BLUE SHIELD | Admitting: Neurology

## 2016-08-28 VITALS — BP 132/64 | HR 98 | Resp 16 | Ht 64.0 in | Wt 159.0 lb

## 2016-08-28 DIAGNOSIS — J449 Chronic obstructive pulmonary disease, unspecified: Secondary | ICD-10-CM | POA: Diagnosis not present

## 2016-08-28 DIAGNOSIS — R4189 Other symptoms and signs involving cognitive functions and awareness: Secondary | ICD-10-CM | POA: Diagnosis not present

## 2016-08-28 DIAGNOSIS — G4734 Idiopathic sleep related nonobstructive alveolar hypoventilation: Secondary | ICD-10-CM | POA: Diagnosis not present

## 2016-08-28 DIAGNOSIS — G4733 Obstructive sleep apnea (adult) (pediatric): Secondary | ICD-10-CM | POA: Diagnosis not present

## 2016-08-28 DIAGNOSIS — F0280 Dementia in other diseases classified elsewhere without behavioral disturbance: Secondary | ICD-10-CM | POA: Diagnosis not present

## 2016-08-28 MED ORDER — PRIMIDONE 250 MG PO TABS: 250.0000 mg | ORAL_TABLET | Freq: Every day | ORAL | 3 refills | Status: DC

## 2016-08-28 MED ORDER — MULTI-B COMPLEX PO CAPS: ORAL_CAPSULE | ORAL | 0 refills | Status: AC

## 2016-08-28 NOTE — Progress Notes (Signed)
[facility]  Provider:  Melvyn Novasarmen  Brittany [name], M D  Referring Provider: Sherren [name], Brittany N, [name] Primary Care Physician:  Brittany SorensonSHAW,Brittany [name]  Chief Complaint  Patient presents with  . New Problem    Rm 11. Patient states that her mother was dx with Parkinson's and her father has Dementia. She wants to see if she is at risk for this.     HPI:  Brittany [name] is a [age] y.o. caucasian , married , right handed  female . She was first and last seen in April 2014  seen here as a referral from Dr. Norberto SorensonEva [name] for a sleep problem.  [name] reports having difficulties to fall asleep, but can stay asleep through the rest of the night.  Her husband shares her bedroom and her son lives in the parental home, too. Both report thunderous , loud snoring. The family dog  ( one of 4) no longer sleeps in the bedroom, which her husband , who is almost deaf, attributed to the snoring.  The patient rises at 7:30 AM, she needs an alarm.  She feels often not refreshed. He does not take breakfast, she will have a cigarette and coffee at home, at work she will have a coffee and oatmeal.  She works in a cubicle.The patient works for a [employer] and has variable hours. On Monday and Wednesday from 9 AM to 19.30 hours, the rest of the week 8 AM  to 17.00. and Saturdays 9 to 12 . She has natural daylight exposure at her workplace. She has diabetes and feels that one hour after any meal she gets sleepy. When home she plays on the lab-computer and falls asleep in front of the screen , often for 3 hours. She doesn't exercise and doesn't walk outside.  She transfers to the bedroom around 1.30 , some times falls asleep quickly, sometimes not. She remembers few dreams, feels she hardly dreams for a year or so. No nocturia.  She first heart her husband complaining about her snoring about one year ago, after the removal of all maxillary teeth. She has gained over the last year, too.  She likes to sleep on one pillow, on the  side.  Her bedroom is dark and quiet. Her husband watches TV in bed, but she switches it off when she comes to bed.  She had a remote cervical fusion with anterior access in 2010.  She used to work in a [employer] in her 20's , at  [employer] , with noise and dust exposure.  She is married for 30 years ,  has 2 children, living still at home. The younger is [id number].   I ordered a sleep study, but her insurance would not cover.    Established patient visit with new problem, dated 20th of December 2017. I see Mrs. hours today her last and first appointment with me was about 57 months ago. Her concern today is in regards to tremors and memory loss. She reports that she has noticed a tremor developing she still is fighting depression symptoms, fibromyalgia, high cholesterol, diabetes, nicotine abuse, and she continues to snore. As described above a sleep study had not been covered been originally ordered. She reports that a father with dementia and a mother with Parkinson's disease. She is concerned that she may take either path. We performed a Montral cognitive assessment,  Only 22/30 points. She has short term memory loss.    Montreal Cognitive Assessment  08/28/2016  Visuospatial/ Executive (0/5) 4  Naming (0/3) 3  Attention: Read list of digits (0/2) 2  Attention: Read list of letters (0/1) 1  Attention: Serial 7 subtraction starting at 100 (0/3) 0  Language: Repeat phrase (0/2) 2  Language : Fluency (0/1) 0  Abstraction (0/2) 2  Delayed Recall (0/5) 2  Orientation (0/6) 6  Total 22  Adjusted Score (based on education) 2    Mrs. hours has a history of diabetes not insulin-dependent, hyperlipidemia, she takes primidone for essential tremor, Zoloft for depression, Atrovent for bronchitic and asthmatic episodes, Proventil inhaler for COPD ,Tessalon Pearles, chronic coughing  Cozaar for hypertension, liaglyoptin  for diabetes, Victoza for diabetes, Glucophage tablets for diabetes,  Review  of Systems: Out of a complete 14 system review, the patient complains of only the following symptoms, and all other reviewed systems are negative. Snoring.  She has bronchitis inflammation, coughing.  She smokes, still.'   She has noted dry skin and depression over the last 57 month. Has been in menopausal.   Social History   Social History  . Marital status: Married    Spouse name: Brittany [name]  . Number of children: 2  . Years of education: 14   Occupational History  . Not on file.   Social History Main Topics  . Smoking status: Current Every Day Smoker    Packs/day: 1.00    Years: 2.00    Types: Cigarettes  . Smokeless tobacco: Never Used  . Alcohol use Yes     Comment: rarely  . Drug use: No  . Sexual activity: No   Other Topics Concern  . Not on file   Social History Narrative   Patient is married ([name]) and lives at home with her family   Patient has two children.   Patient works at [employer].   Patient has a college education.   Patient is right-handed.   Patient drinks 3 cups of coffee M-F.          Family History  Problem Relation Age of Onset  . Cancer Mother   . Diabetes Mother   . Heart disease Mother   . Parkinsonism Mother   . Diabetes Father   . Heart disease Father   . Dementia Father   . Cancer Maternal Grandmother 62    lung  . COPD Maternal Aunt     Past Medical History:  Diagnosis Date  . Depression   . Diabetes mellitus without complication (HCC)   . Fibromyalgia   . High cholesterol     Past Surgical History:  Procedure Laterality Date  . CHOLECYSTECTOMY    . SPINE SURGERY    . TONSILLECTOMY      Current Outpatient Prescriptions  Medication Sig Dispense Refill  . albuterol (PROVENTIL) (2.5 MG/3ML) 0.083% nebulizer solution Take 3 mLs (2.5 mg total) by nebulization every 6 (six) hours as needed for wheezing or shortness of breath. 150 mL 1  . Albuterol Sulfate (PROAIR RESPICLICK) 108 (90 Base) MCG/ACT AEPB Inhale 2  puffs into the lungs every 4 (four) hours as needed. 1 each 2  . aspirin 325 MG tablet Take 325 mg by mouth daily.    Marland Kitchen atorvastatin (LIPITOR) 40 MG tablet Take 1 tablet (40 mg total) by mouth daily. 90 tablet 3  . BD PEN NEEDLE NANO U/F 32G X 4 MM MISC USE WITH VICTOZA PEN 100 each 3  . benzonatate (TESSALON) 200 MG capsule Take 1 capsule (200 mg total) by mouth 3 (three)  times daily as needed for cough. 40 capsule 0  . cholecalciferol (VITAMIN D) 1000 UNITS tablet Take 1,000 Units by mouth daily.    Marland Kitchen. linagliptin (TRADJENTA) 5 MG TABS tablet Take 1 tablet (5 mg total) by mouth daily. 90 tablet 1  . liraglutide (VICTOZA) 18 MG/3ML SOPN Inject 0.3 mLs (1.8 mg total) into the skin daily. 10 pen 1  . losartan (COZAAR) 50 MG tablet Take 1 tablet (50 mg total) by mouth daily. 90 tablet 3  . metFORMIN (GLUCOPHAGE XR) 500 MG 24 hr tablet Take 4 tablets (2,000 mg total) by mouth daily with breakfast. 360 tablet 1  . primidone (MYSOLINE) 250 MG tablet Take 1 tablet (250 mg total) by mouth at bedtime. 90 tablet 3  . sertraline (ZOLOFT) 100 MG tablet Take 2 tablets (200 mg total) by mouth at bedtime. 180 tablet 3   No current facility-administered medications for this visit.     Allergies as of 08/28/2016  . (No Known Allergies)    Vitals: BP 132/64   Pulse 98   Resp 16   Ht 5\' 4"  (1.626 m)   Wt 159 lb (72.1 kg)   BMI 27.29 kg/m  Last Weight:  Wt Readings from Last 1 Encounters:  08/28/16 159 lb (72.1 kg)   Last Height:   Ht Readings from Last 1 Encounters:  08/28/16 5\' 4"  (1.626 m)    Physical exam:  General: The patient is awake, alert and appears not in acute distress. The patient is well groomed. Head: Normocephalic, atraumatic.  Neck is supple. Mallampati 4 , neck circumference: 15 inches, over bite ( retrognathia)  now with complete upper dentures partially improved, TMJ click but not pain.  Normal swallowing.  Cardiovascular:  Regular rate and rhythm  without  murmurs or  carotid bruit, and without distended neck veins. Respiratory: Lungs are clear to auscultation. Skin:  Without evidence of edema, or rash Trunk: BMI is  elevated and patient  has normal posture.  Neurologic exam : The patient is awake and alert, oriented to place and time.  Memory subjective described as impaired-    Montreal Cognitive Assessment  08/28/2016  Visuospatial/ Executive (0/5) 4  Naming (0/3) 3  Attention: Read list of digits (0/2) 2  Attention: Read list of letters (0/1) 1  Attention: Serial 7 subtraction starting at 100 (0/3) 0  Language: Repeat phrase (0/2) 2  Language : Fluency (0/1) 0  Abstraction (0/2) 2  Delayed Recall (0/5) 2  Orientation (0/6) 6  Total 22  Adjusted Score (based on education) 22     There is a normal attention span & concentration ability.  Speech is fluent without dysarthria,  Monotonous voice. Not dysphonia or aphasia.  Mood and affect are appropriate.  Cranial nerves: Pupils are equal and briskly reactive to light. Funduscopic exam without evidence of pallor or edema.  Extraocular movements  in vertical and horizontal planes intact and without nystagmus. Visual fields by finger perimetry are intact. Hearing to finger rub intact.  Facial sensation intact to fine touch. Facial motor strength is symmetric and tongue and uvula move midline.  Motor exam:   Normal tone and  muscle bulk and symmetric strength in all extremities. There is bilateral low amplitude tremor noted but there are no associated parkinsonian symptoms. She neither has a masked face, nor to do ablation, there is no tongue tremor, no dysphonia. She has no history of recent falls, she does not have propulsive or retropulsive tendencies or drift.  Sensory:  Fine touch, pinprick and vibration were tested in all extremities. Proprioception is  normal.  Coordination: Rapid alternating movements in the fingers/hands is tested and normal.  Finger-to-nose maneuver tested and normal  without evidence of ataxia, dysmetria or tremor.  Gait and station: Patient walks without assistive device . She feels sore - Strength within normal limits. Stance is stable and normal. Steps are unfragmented. Romberg testing is normal.  Deep tendon reflexes: in the  upper and lower extremities are symmetric and intact. Babinski  downgoing.   Assessment:  After physical and neurologic examination, review of laboratory studies, imaging, neurophysiology testing and pre-existing records, assessment is :   1) Patient with diabetes , weight gain, retrognathia,  a history of fibromyalgia, and a 3-4 year history of snoring since she had full dentures for the upper jaw.  She presents with declining memory function, essential tremor and is worried about developing dementia and/ or PD>   2)No PD symptoms are present.   3) Dementia is possibly present,  She scored 22-30, always had trouble with math, fractions.  She got an associate degree in medical administration at age 62, 9 years ago. Had no difficulties with learning.   Had a TIA 3 years ago (?)   Plan:  Treatment plan and additional workup :  Essential tremor most likely, not enough to impair patient.  I need to obtain an MRI brain, suepct high risk small vessel disease, given HLD,HTN, DM and  Smoking.  Her last MRI documented a lacunar stroke in 2014.  Repeat MOCA in 3 month, start Vit B 12 complex. Patient will get a HST , needs evalutation for possible apnea, very possibly has hypoxemia, and COPD related .    Start Vitamin B 12  and  Multi Vitamin . MRI brain with and without ordered.  CMET and CBC , TSH , methylmalonic acid.     Mckensi Redinger, [name]

## 2016-08-31 LAB — COMPREHENSIVE METABOLIC PANEL
A/G RATIO: 1.8 (ref 1.2–2.2)
ALBUMIN: 4.2 g/dL (ref 3.5–5.5)
ALK PHOS: 95 IU/L (ref 39–117)
ALT: 20 IU/L (ref 0–32)
AST: 19 IU/L (ref 0–40)
BUN / CREAT RATIO: 15 (ref 9–23)
BUN: 9 mg/dL (ref 6–24)
Bilirubin Total: <0.2 mg/dL (ref 0.0–1.2)
CO2: 26 mmol/L (ref 18–29)
CREATININE: 0.62 mg/dL (ref 0.57–1.00)
Calcium: 9.2 mg/dL (ref 8.7–10.2)
Chloride: 100 mmol/L (ref 96–106)
GFR calc Af Amer: 116 mL/min/{1.73_m2} (ref >59)
GFR calc non Af Amer: 100 mL/min/{1.73_m2} (ref >59)
GLOBULIN, TOTAL: 2.3 g/dL (ref 1.5–4.5)
Glucose: 218 mg/dL — ABNORMAL HIGH (ref 65–99)
POTASSIUM: 4.4 mmol/L (ref 3.5–5.2)
SODIUM: 141 mmol/L (ref 134–144)
Total Protein: 6.5 g/dL (ref 6.0–8.5)

## 2016-08-31 LAB — CBC WITH DIFFERENTIAL/PLATELET
BASOS: 0 %
Basophils Absolute: 0 10*3/uL (ref 0.0–0.2)
EOS (ABSOLUTE): 0.3 10*3/uL (ref 0.0–0.4)
EOS: 3 %
Hematocrit: 40.1 % (ref 34.0–46.6)
Hemoglobin: 13.2 g/dL (ref 11.1–15.9)
Immature Grans (Abs): 0 10*3/uL (ref 0.0–0.1)
Immature Granulocytes: 0 %
LYMPHS ABS: 2.6 10*3/uL (ref 0.7–3.1)
Lymphs: 33 %
MCH: 29.9 pg (ref 26.6–33.0)
MCHC: 32.9 g/dL (ref 31.5–35.7)
MCV: 91 fL (ref 79–97)
MONOS ABS: 0.5 10*3/uL (ref 0.1–0.9)
Monocytes: 6 %
Neutrophils Absolute: 4.7 10*3/uL (ref 1.4–7.0)
Neutrophils: 58 %
Platelets: 245 10*3/uL (ref 150–379)
RBC: 4.42 x10E6/uL (ref 3.77–5.28)
RDW: 13.3 % (ref 12.3–15.4)
WBC: 8.1 10*3/uL (ref 3.4–10.8)

## 2016-08-31 LAB — METHYLMALONIC ACID, SERUM: Methylmalonic Acid: 177 nmol/L (ref 0–378)

## 2016-09-04 ENCOUNTER — Telehealth: Payer: Self-pay

## 2016-09-04 NOTE — Telephone Encounter (Signed)
-----   Message from Melvyn Novasarmen Dohmeier, MD sent at 09/03/2016  1:46 PM EST ----- Normal B 12 level, improved CBC , improved metabolic panel with lower blood glucose . CD

## 2016-09-04 NOTE — Telephone Encounter (Signed)
I spoke to patient and she is aware of results and recommendations.  

## 2016-09-11 ENCOUNTER — Ambulatory Visit (INDEPENDENT_AMBULATORY_CARE_PROVIDER_SITE_OTHER): Payer: BLUE CROSS/BLUE SHIELD

## 2016-09-11 DIAGNOSIS — R4189 Other symptoms and signs involving cognitive functions and awareness: Secondary | ICD-10-CM

## 2016-09-11 DIAGNOSIS — G4734 Idiopathic sleep related nonobstructive alveolar hypoventilation: Secondary | ICD-10-CM

## 2016-09-11 DIAGNOSIS — G4733 Obstructive sleep apnea (adult) (pediatric): Secondary | ICD-10-CM

## 2016-09-11 DIAGNOSIS — J449 Chronic obstructive pulmonary disease, unspecified: Principal | ICD-10-CM

## 2016-09-11 MED ORDER — GADOPENTETATE DIMEGLUMINE 469.01 MG/ML IV SOLN: 15.0000 mL | Freq: Once | INTRAVENOUS | Status: DC | PRN

## 2016-09-13 ENCOUNTER — Telehealth: Payer: Self-pay | Admitting: *Deleted

## 2016-09-13 NOTE — Telephone Encounter (Signed)
Per Dr  Richrd HumblesPenumalli's note, spoke with patient and informed her that her MRI brain results were unremarkable. There are no major findings. She verbalized understanding, appreciation, had no questions.

## 2016-09-30 ENCOUNTER — Ambulatory Visit (INDEPENDENT_AMBULATORY_CARE_PROVIDER_SITE_OTHER): Payer: BLUE CROSS/BLUE SHIELD | Admitting: Neurology

## 2016-09-30 DIAGNOSIS — G4733 Obstructive sleep apnea (adult) (pediatric): Secondary | ICD-10-CM

## 2016-09-30 DIAGNOSIS — G4734 Idiopathic sleep related nonobstructive alveolar hypoventilation: Secondary | ICD-10-CM

## 2016-09-30 DIAGNOSIS — R4189 Other symptoms and signs involving cognitive functions and awareness: Secondary | ICD-10-CM

## 2016-09-30 DIAGNOSIS — J449 Chronic obstructive pulmonary disease, unspecified: Principal | ICD-10-CM

## 2016-10-03 ENCOUNTER — Telehealth: Payer: Self-pay | Admitting: Neurology

## 2016-10-03 DIAGNOSIS — G4733 Obstructive sleep apnea (adult) (pediatric): Secondary | ICD-10-CM

## 2016-10-03 DIAGNOSIS — R4189 Other symptoms and signs involving cognitive functions and awareness: Secondary | ICD-10-CM

## 2016-10-03 DIAGNOSIS — G4734 Idiopathic sleep related nonobstructive alveolar hypoventilation: Secondary | ICD-10-CM

## 2016-10-03 DIAGNOSIS — G458 Other transient cerebral ischemic attacks and related syndromes: Secondary | ICD-10-CM

## 2016-10-03 DIAGNOSIS — J449 Chronic obstructive pulmonary disease, unspecified: Principal | ICD-10-CM

## 2016-10-03 NOTE — Procedures (Signed)
NAME: Corie ChiquitoDenise Hartig DOB: 1961/03/19 MEDICAL RECORD [id number] DOS:  09/30/16 REFERRING PHYSICIAN: Norberto SorensonEva Shaw, MD   STUDY PERFORMED:  HST/ Out of Center Sleep Test  HISTORY: Jenne CampusDenise W Hasting is a 56 y.o., right handed female , who  reports having difficulties to fall asleep, but can stay asleep through the rest of the night.  Her husband shares her bedroom and her son lives in the parental home, too. Both report thunderous, loud snoring. The family dog  (one of 4) no longer sleeps in the bedroom, which her husband, who is almost deaf, attributed to the snoring.  The patient rises at 7:30 AM with some reluctance, she needs an alarm.  She feels not refreshed. Her memory is poor and she is fatigued. MOCA 22/30 .lacunar stroke,  hyperlipidemia, TIA, active tobacco user. OSA may overlap with her COPD.   Epworth   FSS , BMI      STUDY RESULTS:  Total Recording Time:  6h 1429m  Total Apnea/Hypopnea Index (AHI) 12.9/hr.   Average Oxygen Desaturation: SpO2  89%  Lowest Oxygen desaturation: SpO2  82%  with 233 minutes of desaturation time at or below 89% and 67 minutes at or below 88%.    IMPRESSION: Mild sleep apnea at AHI of 12.9, snoring contributed to the RDI of 13.6/hr. The patient has clinically significant hypoxemia and tachycardia with heart rates up to 180 bpm.  RECOMMENDATION: This degree of sleep hypoxemia needs to be treated . Oxygen deprivation can be improved by treating underlying Apnea with CPAP and , if necessary, with oxygen.  I strongly recommend an in lab titration to allow CPAP and oxygen to be applied as necessary. This patient carries a diagnosis of OSA with COPD , hypoxemia, cognitive impairment.  I certify that I have reviewed the raw data recording prior to the issuance of this report in accordance with the standards of Accreditation of the American Academy of Sleep medicine (AASM) Melvyn Novasarmen Prithvi Kooi, MD   10-04-2015 Diplomat, American Board of Psychiatry and Neurology

## 2016-10-07 ENCOUNTER — Telehealth: Payer: Self-pay | Admitting: Neurology

## 2016-10-07 NOTE — Telephone Encounter (Signed)
-----   Message from Melvyn Novasarmen Dohmeier, MD sent at 10/03/2016  5:23 PM EST ----- IMPRESSION: Mild sleep apnea at AHI of 12.9, snoring contributed to the RDI of 13.6/hr. The patient has clinically significant hypoxemia and tachycardia with heart rates up to 180 bpm.  RECOMMENDATION: This degree of sleep hypoxemia needs to be treated . Oxygen deprivation can be improved by treating underlying Apnea with CPAP and , if necessary, with oxygen.  I strongly recommend an in lab titration to allow CPAP and oxygen to be applied as necessary. This patient carries a diagnosis of OSA with COPD , hypoxemia, cognitive impairment.  I certify that I have reviewed the raw data recording prior to the issuance of this report in accordance with the standards of Accreditation of the American Academy of Sleep medicine (AASM) Melvyn Novasarmen Dohmeier, MD   10-04-2015

## 2016-10-07 NOTE — Telephone Encounter (Signed)
Sorry for the incorrect information.  Patient has [employer] and will need an authorization for the procedure.  I will start the process today.

## 2016-10-07 NOTE — Telephone Encounter (Signed)
Hi Brittany [name], this pt does not have [employer]. She has [employer]. Did [employer] deny the cpap titration?

## 2016-10-07 NOTE — Telephone Encounter (Signed)
[employer] denied CPAP suggested autopap °

## 2016-10-07 NOTE — Telephone Encounter (Signed)
I called pt. I advised her that her sleep study revealed mild sleep apnea with an AHI of 12.9 but with clinically significant hypoxemia and tachycardia. Dr. Vickey Hugerohmeier recommends that this degree of sleepy hypoxemia to be treated. Oxygen deprivation can be improved by treating underlying apnea with a cpap and additional oxygen if needed. Dr. Vickey Hugerohmeier strongly recommends an in-lba titration to allow cpap and oxygen to be applied as necessary. Pt is agreeable to this. Of note, pt's insurance now is [employer]. I will check with the sleep lab to see if pt may qualify for an in lab sleep study with [employer]. Pt verbalized understanding of results. Pt had no questions at this time but was encouraged to call back if questions arise.

## 2016-10-08 ENCOUNTER — Other Ambulatory Visit: Payer: Self-pay | Admitting: Family Medicine

## 2016-10-08 DIAGNOSIS — Z1231 Encounter for screening mammogram for malignant neoplasm of breast: Secondary | ICD-10-CM

## 2016-10-22 ENCOUNTER — Ambulatory Visit
Admission: RE | Admit: 2016-10-22 | Discharge: 2016-10-22 | Disposition: A | Discharge status: Home / Self Care | Payer: BLUE CROSS/BLUE SHIELD | Source: Ambulatory Visit | Attending: Family Medicine | Admitting: Family Medicine

## 2016-10-22 DIAGNOSIS — Z1231 Encounter for screening mammogram for malignant neoplasm of breast: Secondary | ICD-10-CM

## 2016-11-27 ENCOUNTER — Encounter: Payer: Self-pay | Admitting: Neurology

## 2016-11-27 ENCOUNTER — Telehealth: Payer: Self-pay

## 2016-11-27 ENCOUNTER — Ambulatory Visit (INDEPENDENT_AMBULATORY_CARE_PROVIDER_SITE_OTHER): Payer: BLUE CROSS/BLUE SHIELD | Admitting: Neurology

## 2016-11-27 VITALS — BP 128/57 | HR 98 | Resp 16 | Ht 64.0 in | Wt 155.0 lb

## 2016-11-27 DIAGNOSIS — I639 Cerebral infarction, unspecified: Secondary | ICD-10-CM | POA: Diagnosis not present

## 2016-11-27 DIAGNOSIS — J449 Chronic obstructive pulmonary disease, unspecified: Secondary | ICD-10-CM | POA: Diagnosis not present

## 2016-11-27 DIAGNOSIS — G3184 Mild cognitive impairment, so stated: Secondary | ICD-10-CM

## 2016-11-27 DIAGNOSIS — I6381 Other cerebral infarction due to occlusion or stenosis of small artery: Secondary | ICD-10-CM

## 2016-11-27 DIAGNOSIS — G4733 Obstructive sleep apnea (adult) (pediatric): Secondary | ICD-10-CM

## 2016-11-27 NOTE — Patient Instructions (Signed)
Preventing Cerebrovascular Disease Arteries are blood vessels that carry blood that contains oxygen from the heart to all parts of the body. Cerebrovascular disease affects arteries that supply the brain. Any condition that blocks or disrupts blood flow to the brain can cause cerebrovascular disease. Brain cells that lose blood supply start to die within minutes (stroke). Stroke is the main danger of cerebrovascular disease. Atherosclerosis and high blood pressure are common causes of cerebrovascular disease. Atherosclerosis is narrowing and hardening of an artery that results when fat, cholesterol, calcium, or other substances (plaque) build up inside an artery. Plaque reduces blood flow through the artery. High blood pressure increases the risk of bleeding inside the brain. Making diet and lifestyle changes to prevent atherosclerosis and high blood pressure lowers your risk of cerebrovascular disease. What nutrition changes can be made?  Eat more fruits, vegetables, and whole grains.  Reduce how much saturated fat you eat. To do this, eat less red meat and fewer full-fat dairy products.  Eat healthy proteins instead of red meat. Healthy proteins include:  Fish. Eat fish that contains heart-healthy omega-3 fatty acids, twice a week. Examples include salmon, albacore tuna, mackerel, and herring.  Chicken.  Nuts.  Low-fat or nonfat yogurt.  Avoid processed meats, like bacon and lunchmeat.  Avoid foods that contain:  A lot of sugar, such as sweets and drinks with added sugar.  A lot of salt (sodium). Avoid adding extra salt to your food, as told by your health care provider.  Trans fats, such as margarine and baked goods. Trans fats may be listed as "partially hydrogenated oils" on food labels.  Check food labels to see how much sodium, sugar, and trans fats are in foods.  Use vegetable oils that contain low amounts of saturated fat, such as olive oil or canola oil. What lifestyle  changes can be made?  Drink alcohol in moderation. This means no more than 1 drink a day for nonpregnant women and 2 drinks a day for men. One drink equals 12 oz of beer, 5 oz of wine, or 1 oz of hard liquor.  If you are overweight, ask your health care provider to recommend a weight-loss plan for you. Losing 5-10 lb (2.2-4.5 kg) can reduce your risk of diabetes, atherosclerosis, and high blood pressure.  Exercise for 30?60 minutes on most days, or as much as told by your health care provider.  Do moderate-intensity exercise, such as brisk walking, bicycling, and water aerobics. Ask your health care provider which activities are safe for you.  Do not use any products that contain nicotine or tobacco, such as cigarettes and e-cigarettes. If you need help quitting, ask your health care provider. Why are these changes important? Making these changes lowers your risk of many diseases that can cause cerebrovascular disease and stroke. Stroke is a leading cause of death and disability. Making these changes also improves your overall health and quality of life. What can I do to lower my risk? The following factors make you more likely to develop cerebrovascular disease:  Being overweight.  Smoking.  Being physically inactive.  Eating a high-fat diet.  Having certain health conditions, such as:  Diabetes.  High blood pressure.  Heart disease.  Atherosclerosis.  High cholesterol.  Sickle cell disease. Talk with your health care provider about your risk for cerebrovascular disease. Work with your health care provider to control diseases that you have that may contribute to cerebrovascular disease. Your health care provider may prescribe medicines to help prevent major   causes of cerebrovascular disease. Where to find more information: Learn more about preventing cerebrovascular disease from:  National Heart, Lung, and Blood Institute:  www.nhlbi.nih.gov/health/health-topics/topics/stroke  Centers for Disease Control and Prevention: cdc.gov/stroke/about.htm Summary  Cerebrovascular disease can lead to a stroke.  Atherosclerosis and high blood pressure are major causes of cerebrovascular disease.  Making diet and lifestyle changes can reduce your risk of cerebrovascular disease.  Work with your health care provider to get your risk factors under control to reduce your risk of cerebrovascular disease. This information is not intended to replace advice given to you by your health care provider. Make sure you discuss any questions you have with your health care provider. Document Released: 09/10/2015 Document Revised: 03/15/2016 Document Reviewed: 09/10/2015 Elsevier Interactive Patient Education  2017 Elsevier Inc.  

## 2016-11-27 NOTE — Telephone Encounter (Signed)
I called pt. She did not make a follow up appt at the end of her visit with Dr. Vickey Hugerohmeier to follow up with her auto pap. Pt is agreeable to a follow up on 03/04/2017 at 8:30am. I advised her that I will send the order for her auto pap to a DME, [facility], and they will call pt to discuss set up, mask fit, etc. I reviewed auto pap compliance requirements with the pt. Pt is agreeable to this. Pt verbalized understanding.

## 2016-11-27 NOTE — Progress Notes (Addendum)
[facility]  Provider:  Melvyn [name], M D  Referring Provider: [name], MD Primary Care Physician:  Brittany Sorenson, MD  Chief Complaint  Patient presents with  . Follow-up    Rm 10. No new concerns per patient. Patient has not started CPAP.     HPI:  Brittany [name] is a 56 y.o. caucasian , married , right handed  female . She was first and last seen in April 2014  seen here as a referral from Dr. Norberto [name] for a sleep problem.  Brittany [name] reports having difficulties to fall asleep, but can stay asleep through the rest of the night.  Her husband shares her bedroom and her son lives in the parental home, too. Both report thunderous , loud snoring. The family dog  ( one of 4) no longer sleeps in the bedroom, which her husband , who is almost deaf, attributed to the snoring.  The patient rises at 7:30 AM, she needs an alarm.  She feels often not refreshed. He does not take breakfast, she will have a cigarette and coffee at home, at work she will have a coffee and oatmeal. She works in a cubicle.The patient works for a [employer] and has variable [name]. On Monday and Wednesday from 9 AM to 19.30 [name], the rest of the week 8 AM  to 17.00. and Saturdays 9 to 12 . She has natural daylight exposure at her workplace. She has diabetes and feels that one hour after any meal she gets sleepy. When home she plays on the lab-computer and falls asleep in front of the screen , often for 3 [name]. She doesn't exercise and doesn't walk outside.  She transfers to the bedroom around 1.30 , some times falls asleep quickly, sometimes not. She remembers few dreams, feels she hardly dreams for a year or so. No nocturia. She first heart her husband complaining about her snoring about one year ago, after the removal of all maxillary teeth. She has gained over the last year, too.  She likes to sleep on one pillow, on the side.  Her bedroom is dark and quiet. Her husband watches TV in bed, but she  switches it off when she comes to bed.  She had a remote cervical fusion with anterior access in 2010.  She used to work in a [employer] in her 20's , at  [employer] , with noise and dust exposure.  She is married for 30 years,  has 2 children, living still at home. The younger is 38.  I ordered a sleep study, but her insurance would not cover.   Established patient visit with new problem, dated 20th of December 2017. I see Brittany [name] today her last and first appointment with me was about 30 months ago. Her concern today is in regards to tremors and memory loss. She reports that she has noticed a tremor developing she still is fighting depression symptoms, fibromyalgia, high cholesterol, diabetes, nicotine abuse, and she continues to snore. As described above a sleep study had not been covered been originally ordered. She reports that a father with dementia and a mother with Parkinson's disease. She is concerned that she may take either path. We performed a Montral cognitive assessment,  Only 22/30 points. She has short term memory loss.   Interval history from 11/27/2016. Brittany [name] has meanwhile finally undergone a home sleep test and it showed OSA and possible COPD overlap syndrome with intermittent hypoxia in the  presence of rather mild apnea and tachycardia with rates up to 180 bpm. Total desaturation time was 233 minutes. In lab titration was denied I will order an outer titration for this patient is a CPAP between 5 and 15 cm water pressure. We also reviewed the test results for comprehensive metabolic panel with a malonic acid and her primary care physician's TSH within normal limits this time, only the fasting  Glucose was elevated. I will asked Dr. Clelia Croft to share the last fasting lipid panel with me.  MRI from 09/11/2016 showed no abnormal lesions, normal ventricle size, normal intracranial flow voids, no microvascular disease. No mentioning of atrophy. Please review the Montral cognitive  assessment below. The patient endorsed a day 7 points on the Epworth sleepiness score 9 points on the fatigue severity score.  I will order an auto titration for her.   Montreal Cognitive Assessment  11/27/2016 08/28/2016  Visuospatial/ Executive (0/5) 4 4  Naming (0/3) 3 3  Attention: Read list of digits (0/2) 2 2  Attention: Read list of letters (0/1) 1 1  Attention: Serial 7 subtraction starting at 100 (0/3) 2 0  Language: Repeat phrase (0/2) 2 2  Language : Fluency (0/1) 1 0  Abstraction (0/2) 2 2  Delayed Recall (0/5) 4 2  Orientation (0/6) 6 6  Total 27 22  Adjusted Score (based on education) 53 22    Brittany [name] has a history of diabetes not insulin-dependent, hyperlipidemia, she takes primidone for essential tremor, Zoloft for depression, Atrovent for bronchitic and asthmatic episodes, Proventil inhaler for COPD ,Tessalon Pearles, chronic coughing  Cozaar for hypertension, liaglyoptin  for diabetes, Victoza for diabetes, Glucophage tablets for diabetes,  Review of Systems: Out of a complete 14 system review, the patient complains of only the following symptoms, and all other reviewed systems are negative. Snoring.  She has bronchitis inflammation, coughing.  She smokes, still.'   She has noted dry skin and depression over the last 24 month. Has been in menopausal.   Social History   Social History  . Marital status: Married    Spouse name: Brittany [name]  . Number of children: 2  . Years of education: 14   Occupational History  . Not on file.   Social History Main Topics  . Smoking status: Current Every Day Smoker    Packs/day: 1.00    Years: 2.00    Types: Cigarettes  . Smokeless tobacco: Never Used  . Alcohol use Yes     Comment: rarely  . Drug use: No  . Sexual activity: No   Other Topics Concern  . Not on file   Social History Narrative   Patient is married ([name]) and lives at home with her family   Patient has two children.   Patient works at [employer].   Patient has a college education.   Patient is right-handed.   Patient drinks 3 cups of coffee M-F.          Family History  Problem Relation Age of Onset  . Cancer Mother   . Diabetes Mother   . Heart disease Mother   . Parkinsonism Mother   . Diabetes Father   . Heart disease Father   . Dementia Father   . Cancer Maternal Grandmother 62    lung  . COPD Maternal Aunt     Past Medical History:  Diagnosis Date  . Depression   . Diabetes mellitus without complication (HCC)   . Fibromyalgia   .  High cholesterol     Past Surgical History:  Procedure Laterality Date  . CHOLECYSTECTOMY    . SPINE SURGERY    . TONSILLECTOMY      Current Outpatient Prescriptions  Medication Sig Dispense Refill  . albuterol (PROVENTIL) (2.5 MG/3ML) 0.083% nebulizer solution Take 3 mLs (2.5 mg total) by nebulization every 6 (six) [name] as needed for wheezing or shortness of breath. 150 mL 1  . Albuterol Sulfate (PROAIR RESPICLICK) 108 (90 Base) MCG/ACT AEPB Inhale 2 puffs into the lungs every 4 (four) [name] as needed. 1 each 2  . aspirin 325 MG tablet Take 325 mg by mouth daily.    Marland Kitchen. atorvastatin (LIPITOR) 40 MG tablet Take 1 tablet (40 mg total) by mouth daily. 90 tablet 3  . B Complex-Biotin-FA (MULTI-B COMPLEX) CAPS Take one a day ,B complex  multivitamin over the counter or use prenatal vitamin. 90 capsule 0  . BD PEN NEEDLE NANO U/F 32G X 4 MM MISC USE WITH VICTOZA PEN 100 each 3  . benzonatate (TESSALON) 200 MG capsule Take 1 capsule (200 mg total) by mouth 3 (three) times daily as needed for cough. 40 capsule 0  . cholecalciferol (VITAMIN D) 1000 UNITS tablet Take 1,000 Units by mouth daily.    Marland Kitchen. linagliptin (TRADJENTA) 5 MG TABS tablet Take 1 tablet (5 mg total) by mouth daily. 90 tablet 1  . liraglutide (VICTOZA) 18 MG/3ML SOPN Inject 0.3 mLs (1.8 mg total) into the skin daily. 10 pen 1  . losartan (COZAAR) 50 MG tablet Take 1 tablet (50 mg total) by mouth daily. 90 tablet 3   . metFORMIN (GLUCOPHAGE XR) 500 MG 24 hr tablet Take 4 tablets (2,000 mg total) by mouth daily with breakfast. 360 tablet 1  . primidone (MYSOLINE) 250 MG tablet Take 1 tablet (250 mg total) by mouth at bedtime. 90 tablet 3  . sertraline (ZOLOFT) 100 MG tablet Take 2 tablets (200 mg total) by mouth at bedtime. 180 tablet 3   No current facility-administered medications for this visit.    Facility-Administered Medications Ordered in Other Visits  Medication Dose Route Frequency Provider Last Rate Last Dose  . gadopentetate dimeglumine (MAGNEVIST) injection 15 mL  15 mL Intravenous Once PRN Brittany Novasarmen Vinette Crites, MD        Allergies as of 11/27/2016  . (No Known Allergies)    Vitals: BP (!) 128/57   Pulse 98   Resp 16   Ht 5\' 4"  (1.626 m)   Wt 155 lb (70.3 kg)   BMI 26.61 kg/m  Last Weight:  Wt Readings from Last 1 Encounters:  11/27/16 155 lb (70.3 kg)   Last Height:   Ht Readings from Last 1 Encounters:  11/27/16 5\' 4"  (1.626 m)    Physical exam:  General: The patient is awake, alert and appears not in acute distress. The patient is well groomed. Head: Normocephalic, atraumatic.  Neck is supple. Mallampati 4 , neck circumference: 15 inches, over bite ( retrognathia)  now with complete upper dentures partially improved, TMJ click but not pain.  Normal swallowing.  Cardiovascular:  Regular rate and rhythm  without  murmurs or carotid bruit, and without distended neck veins. Respiratory: Lungs are clear to auscultation. Skin:  Without evidence of edema, or rash Trunk: BMI is  elevated and patient  has normal posture.  Neurologic exam : The patient is awake and alert, oriented to place and time.  Memory subjective described as impaired-    Montreal Cognitive Assessment  11/27/2016  08/28/2016  Visuospatial/ Executive (0/5) 4 4  Naming (0/3) 3 3  Attention: Read list of digits (0/2) 2 2  Attention: Read list of letters (0/1) 1 1  Attention: Serial 7 subtraction starting at 100  (0/3) 2 0  Language: Repeat phrase (0/2) 2 2  Language : Fluency (0/1) 1 0  Abstraction (0/2) 2 2  Delayed Recall (0/5) 4 2  Orientation (0/6) 6 6  Total 27 22  Adjusted Score (based on education) 27 22      Assessment:  After physical and neurologic examination, review of laboratory studies, imaging, neurophysiology testing and pre-existing records, assessment is :  Patient with diabetes , weight gain, retrognathia,  a history of fibromyalgia, and a 3-4 year history of snoring since she had full dentures for the upper jaw.  She presents with declining memory function, essential tremor and is worried about developing dementia and/ or PD.   1) she has mild OSA and severe nocturnal hypoxemia according to HST. Heart rate variability was unusual with tachycardia events at night. COPD (?) overlap with OSA. Needs to start on CPAP.   2) MCI is possibly present, Be reviewed her cognitive impairment in comparison to her December test results in a Montral cognitive assessment. 27 out of 30 points to place her in the normal range. This is much better than her result from last December.  Normal B12, CBC and metabolic panel, except for high glucose.   Unusual as she received  an associate degree in medical administration at age 89, 9 years ago. Had no difficulties with learning at that time .    3)Had a TIA 3 years ago (?) . Brain MRI was normal, one cyst remarked upon by Dr Pearlean Brownie . No additional lacunes mentioned. I like her to take baby aspirin daily. She takes Multivitamins now.   4) smoking cessation - refer to classes at [facility]?    I will need Mrs. Ferber to return in 90 days for CPAP follow up, either with me or NP.       Wilberto Console, MD     Cc dr Clelia Croft, MD

## 2016-12-12 DIAGNOSIS — G4733 Obstructive sleep apnea (adult) (pediatric): Secondary | ICD-10-CM | POA: Diagnosis not present

## 2017-01-11 DIAGNOSIS — G4733 Obstructive sleep apnea (adult) (pediatric): Secondary | ICD-10-CM | POA: Diagnosis not present

## 2017-02-11 DIAGNOSIS — G4733 Obstructive sleep apnea (adult) (pediatric): Secondary | ICD-10-CM | POA: Diagnosis not present

## 2017-03-04 ENCOUNTER — Ambulatory Visit (INDEPENDENT_AMBULATORY_CARE_PROVIDER_SITE_OTHER): Payer: BLUE CROSS/BLUE SHIELD | Admitting: Neurology

## 2017-03-04 ENCOUNTER — Encounter: Payer: Self-pay | Admitting: Neurology

## 2017-03-04 ENCOUNTER — Encounter (INDEPENDENT_AMBULATORY_CARE_PROVIDER_SITE_OTHER): Payer: Self-pay

## 2017-03-04 VITALS — BP 139/78 | HR 106 | Ht 64.0 in | Wt 148.0 lb

## 2017-03-04 DIAGNOSIS — J410 Simple chronic bronchitis: Secondary | ICD-10-CM | POA: Diagnosis not present

## 2017-03-04 DIAGNOSIS — K047 Periapical abscess without sinus: Secondary | ICD-10-CM | POA: Diagnosis not present

## 2017-03-04 DIAGNOSIS — Z9114 Patient's other noncompliance with medication regimen: Secondary | ICD-10-CM

## 2017-03-04 DIAGNOSIS — Z91199 Patient's noncompliance with other medical treatment and regimen due to unspecified reason: Secondary | ICD-10-CM | POA: Insufficient documentation

## 2017-03-04 DIAGNOSIS — G4733 Obstructive sleep apnea (adult) (pediatric): Secondary | ICD-10-CM

## 2017-03-04 DIAGNOSIS — J449 Chronic obstructive pulmonary disease, unspecified: Secondary | ICD-10-CM | POA: Insufficient documentation

## 2017-03-04 NOTE — Addendum Note (Signed)
Addended by: Melvyn NovasHMEIER, Chrisy Hillebrand on: 03/04/2017 09:13 AM   Modules accepted: Orders

## 2017-03-04 NOTE — Progress Notes (Signed)
[facility]  Provider:  Melvyn Novas, M D  Referring Provider: [name], MD Primary Care Physician:  [name], MD  Chief Complaint  Patient presents with  . OSA on CPAP    Reports wearing her CPAP approximately four [name] each night.  Feels the machine wakes her up frequently due to discomfort from the nasal cannula.  . Memory Deficit    MOCA 29/30. Feels her memory is stable.    HPI: I have the pleasure of seeing Brittany [name] today on 03/04/2017, today's Mini-Mental Status Examination and Montral cognitive assessment are 29 out of 30 points her memory is excellent she only missed cube drawing. She endorses still an Epworth sleepiness score of 10 point fatigue severity of only 11 points, her home sleep test from January 22 of this year had shown an AHI of 12.9.  She is using an AutoSet between 5 and 15 cm water with 3 cm EPR average user time on days used is 4-1/2 [name] but her compliance is only 12 days of 30 days. Residual AHI is 3.3. She just started using CPAP on February 13 2017 , was unable to implement CPAP before - due to her 24/7 care of mother, father with Alzheimer's. Has not been able to quit smoking.  looking at the  Last 18 days compliance is 80%.       Brittany [name] is a 56 y.o. caucasian , married , right handed  female . She was first and last seen in April 2014  seen here as a referral from Dr. Norberto Sorenson for a sleep problem.  Brittany [name] reports having difficulties to fall asleep, but can stay asleep through the rest of the night.  Her husband shares her bedroom and her son lives in the parental home, too. Both report thunderous , loud snoring. The family dog  ( one of 4) no longer sleeps in the bedroom, which her husband , who is almost deaf, attributed to the snoring.  The patient rises at 7:30 AM, she needs an alarm.  She feels often not refreshed. He does not take breakfast, she will have a cigarette and coffee at home, at work she will have a  coffee and oatmeal. She works in a cubicle.The patient works for a [employer] and has variable [name]. On Monday and Wednesday from 9 AM to 19.30 [name], the rest of the week 8 AM  to 17.00. and Saturdays 9 to 12 . She has natural daylight exposure at her workplace. She has diabetes and feels that one hour after any meal she gets sleepy. When home she plays on the lab-computer and falls asleep in front of the screen , often for 3 [name]. She doesn't exercise and doesn't walk outside.  She transfers to the bedroom around 1.30 , some times falls asleep quickly, sometimes not. She remembers few dreams, feels she hardly dreams for a year or so. No nocturia. She first heart her husband complaining about her snoring about one year ago, after the removal of all maxillary teeth. She has gained over the last year, too.  She likes to sleep on one pillow, on the side.  Her bedroom is dark and quiet. Her husband watches TV in bed, but she switches it off when she comes to bed.  She had a remote cervical fusion with anterior access in 2010.  She used to work in a [employer] in her 20's , at  "[employer] ,  with noise and dust exposure.  She is married for 30 years,  has 2 children, living still at home. The younger is 22.  I ordered a sleep study, but her insurance would not cover.   Established patient visit with new problem, dated 20th of December 2017. I see Brittany [name] today her last and first appointment with me was about 30 months ago. Her concern today is in regards to tremors and memory loss. She reports that she has noticed a tremor developing she still is fighting depression symptoms, fibromyalgia, high cholesterol, diabetes, nicotine abuse, and she continues to snore. As described above a sleep study had not been covered been originally ordered. She reports that a father with dementia and a mother with Parkinson's disease. She is concerned that she may take either path. We performed a Montral  cognitive assessment,  Only 22/30 points. She has short term memory loss.   Interval history from 11/27/2016. 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 Total desaturation time was 233 minutes. In lab titration was denied I will order an outer titration for this patient is a CPAP between 5 and 15 cm water pressure. We also reviewed the test results for comprehensive metabolic panel with a malonic acid and her primary care physician's TSH within normal limits this time, only the fasting  Glucose was elevated. I will asked Dr. Clelia Croft to share the last fasting lipid panel with me.  MRI from 09/11/2016 showed no abnormal lesions, normal ventricle size, normal intracranial flow voids, no microvascular disease. No mentioning of atrophy. Please review the Montral cognitive assessment below. The patient endorsed a day 7 points on the Epworth sleepiness score 9 points on the fatigue severity score.  I will order an auto titration for her.   Montreal Cognitive Assessment  03/04/2017 11/27/2016 08/28/2016  Visuospatial/ Executive (0/5) 4 4 4   Naming (0/3) 3 3 3   Attention: Read list of digits (0/2) 2 2 2   Attention: Read list of letters (0/1) 1 1 1   Attention: Serial 7 subtraction starting at 100 (0/3) 3 2 0  Language: Repeat phrase (0/2) 2 2 2   Language : Fluency (0/1) 1 1 0  Abstraction (0/2) 2 2 2   Delayed Recall (0/5) 5 4 2   Orientation (0/6) 6 6 6   Total 29 27 22   Adjusted Score (based on education) - 4 22    Brittany [name] has a history of diabetes not insulin-dependent, hyperlipidemia, she takes primidone for essential tremor, Zoloft for depression, Atrovent for bronchitic and asthmatic episodes, Proventil inhaler for COPD ,Tessalon Pearles, chronic coughing  Cozaar for hypertension, liaglyoptin  for diabetes, Victoza for  diabetes, Glucophage tablets for diabetes,  Review of Systems: Out of a complete 14 system review, the patient complains of only the following symptoms, and all other reviewed systems are negative. Snoring.  She has bronchitis inflammation, coughing.  She smokes, still.'   She has noted dry skin and depression over the last 24 month. Has been in menopausal.   Social History   Social History  . Marital status: Married    Spouse name: [name]  . Number of children: 2  . Years of education: 14   Occupational History  . Not on file.   Social History Main Topics  . Smoking status: Current Every Day Smoker    Packs/day: 1.00  Years: 2.00    Types: Cigarettes  . Smokeless tobacco: Never Used  . Alcohol use Yes     Comment: rarely  . Drug use: No  . Sexual activity: No   Other Topics Concern  . Not on file   Social History Narrative   Patient is married ([name]) and lives at home with her family   Patient has two children.   Patient works at [employer].   Patient has a college education.   Patient is right-handed.   Patient drinks 3 cups of coffee M-F.          Family History  Problem Relation Age of Onset  . Cancer Mother   . Diabetes Mother   . Heart disease Mother   . Parkinsonism Mother   . Diabetes Father   . Heart disease Father   . Dementia Father   . Cancer Maternal Grandmother 62       lung  . COPD Maternal Aunt     Past Medical History:  Diagnosis Date  . Depression   . Diabetes mellitus without complication (HCC)   . Fibromyalgia   . High cholesterol     Past Surgical History:  Procedure Laterality Date  . CHOLECYSTECTOMY    . SPINE SURGERY    . TONSILLECTOMY      Current Outpatient Prescriptions  Medication Sig Dispense Refill  . albuterol (PROVENTIL) (2.5 MG/3ML) 0.083% nebulizer solution Take 3 mLs (2.5 mg total) by nebulization every 6 (six) [name] as needed for wheezing or shortness of breath. 150 mL 1  . Albuterol Sulfate  (PROAIR RESPICLICK) 108 (90 Base) MCG/ACT AEPB Inhale 2 puffs into the lungs every 4 (four) [name] as needed. 1 each 2  . aspirin 325 MG tablet Take 325 mg by mouth daily.    Marland Kitchen. atorvastatin (LIPITOR) 40 MG tablet Take 1 tablet (40 mg total) by mouth daily. 90 tablet 3  . B Complex-Biotin-FA (MULTI-B COMPLEX) CAPS Take one a day ,B complex  multivitamin over the counter or use prenatal vitamin. 90 capsule 0  . BD PEN NEEDLE NANO U/F 32G X 4 MM MISC USE WITH VICTOZA PEN 100 each 3  . benzonatate (TESSALON) 200 MG capsule Take 1 capsule (200 mg total) by mouth 3 (three) times daily as needed for cough. 40 capsule 0  . cholecalciferol (VITAMIN D) 1000 UNITS tablet Take 1,000 Units by mouth daily.    Marland Kitchen. liraglutide (VICTOZA) 18 MG/3ML SOPN Inject 0.3 mLs (1.8 mg total) into the skin daily. 10 pen 1  . losartan (COZAAR) 50 MG tablet Take 1 tablet (50 mg total) by mouth daily. 90 tablet 3  . metFORMIN (GLUCOPHAGE XR) 500 MG 24 hr tablet Take 4 tablets (2,000 mg total) by mouth daily with breakfast. 360 tablet 1  . primidone (MYSOLINE) 250 MG tablet Take 1 tablet (250 mg total) by mouth at bedtime. 90 tablet 3  . sertraline (ZOLOFT) 100 MG tablet Take 2 tablets (200 mg total) by mouth at bedtime. 180 tablet 3   No current facility-administered medications for this visit.    Facility-Administered Medications Ordered in Other Visits  Medication Dose Route Frequency Provider Last Rate Last Dose  . gadopentetate dimeglumine (MAGNEVIST) injection 15 mL  15 mL Intravenous Once PRN Shanon Becvar, Porfirio Mylararmen, MD        Allergies as of 03/04/2017  . (No Known Allergies)    Vitals: BP 139/78   Pulse (!) 106   Ht 5\' 4"  (1.626 m)   Wt  148 lb (67.1 kg)   BMI 25.40 kg/m  Last Weight:  Wt Readings from Last 1 Encounters:  03/04/17 148 lb (67.1 kg)   Last Height:   Ht Readings from Last 1 Encounters:  03/04/17 5\' 4"  (1.626 m)    Physical exam:  General: The patient is awake, alert and appears not in acute  distress. The patient is well groomed. Head: Normocephalic, atraumatic.  Neck is supple. Mallampati 4 , neck circumference: 15 inches, over bite ( retrognathia)  now with complete upper dentures partially improved, TMJ click but not pain.  Normal swallowing.  Cardiovascular:  Regular rate and rhythm  without  murmurs or carotid bruit, and without distended neck veins. Respiratory: Lungs are clear to auscultation. Skin:  Without evidence of edema, or rash- she has a tooth abscess that caused her left cheek to swell.  Trunk: BMI is  elevated and patient  has normal posture.  Neurologic exam : The patient is awake and alert, oriented to place and time.  Memory subjective described as impaired- but test results are again normal. Normal CN exam, normal DTR and  Muscle tone, no tremor.     Montreal Cognitive Assessment  03/04/2017 11/27/2016 08/28/2016  Visuospatial/ Executive (0/5) 4 4 4   Naming (0/3) 3 3 3   Attention: Read list of digits (0/2) 2 2 2   Attention: Read list of letters (0/1) 1 1 1   Attention: Serial 7 subtraction starting at 100 (0/3) 3 2 0  Language: Repeat phrase (0/2) 2 2 2   Language : Fluency (0/1) 1 1 0  Abstraction (0/2) 2 2 2   Delayed Recall (0/5) 5 4 2   Orientation (0/6) 6 6 6   Total 29 27 22   Adjusted Score (based on education) - 27 22      Assessment:  After physical and neurologic examination, review of laboratory studies, imaging, neurophysiology testing and pre-existing records, assessment is :  Patient with diabetes , weight gain, retrognathia,  a history of fibromyalgia, and a 3-4 year history of snoring since she had full dentures for the upper jaw.  She presents with declining memory function, essential tremor and is worried about developing dementia and/ or PD.   1) she has mild OSA and severe nocturnal hypoxemia according to [facility] 10-06-2016 . Heart rate variability was unusual with tachycardia events at night. COPD overlap with OSA.  Started CPAP - is not  compliant- we discussed the reasons and it is her being overwhelmed with caretaker duties. She is working full time, her husband and son help with the care of the alzheimer's patient , who is paranoid.  2) Montral cognitive assessment today 29 -30, . 27 out of 30 points to place her in the normal range. This is much better than her result from last December 2017 ( 22 points) . 3)Had a TIA 3 years ago (?) . Brain MRI was normal, one cyst remarked upon by Dr Pearlean Brownie . No additional lacunes mentioned. I like her to take baby aspirin daily. She takes Multivitamins now.  4) smoking cessation - refer to classes at [facility]. She is still thinking about it.    I will need Brittany [name] to return in 1 year  for CPAP follow up,  with me or NP.   Please discuss the smoking cessation with PCP>     Melvyn Novas, MD     Cc Dr Clelia Croft, MD

## 2017-03-05 ENCOUNTER — Telehealth: Payer: Self-pay | Admitting: Neurology

## 2017-03-05 NOTE — Telephone Encounter (Signed)
Called [facility] they don't have a referral for REF100 - AMB  REFERRAL ([facility] Employees Only .) I called and spoke to patient and relayed to her she could call her PCP to help. Patient understood and will call her PCP.

## 2017-05-17 ENCOUNTER — Ambulatory Visit (INDEPENDENT_AMBULATORY_CARE_PROVIDER_SITE_OTHER): Payer: BLUE CROSS/BLUE SHIELD | Admitting: Family Medicine

## 2017-05-17 ENCOUNTER — Encounter: Payer: Self-pay | Admitting: Family Medicine

## 2017-05-17 VITALS — BP 138/84 | HR 92 | Temp 99.3°F | Resp 16 | Ht 63.25 in | Wt 148.8 lb

## 2017-05-17 DIAGNOSIS — J449 Chronic obstructive pulmonary disease, unspecified: Secondary | ICD-10-CM

## 2017-05-17 DIAGNOSIS — E785 Hyperlipidemia, unspecified: Secondary | ICD-10-CM

## 2017-05-17 DIAGNOSIS — I10 Essential (primary) hypertension: Secondary | ICD-10-CM | POA: Diagnosis not present

## 2017-05-17 DIAGNOSIS — E1149 Type 2 diabetes mellitus with other diabetic neurological complication: Secondary | ICD-10-CM

## 2017-05-17 DIAGNOSIS — L298 Other pruritus: Secondary | ICD-10-CM

## 2017-05-17 DIAGNOSIS — F3341 Major depressive disorder, recurrent, in partial remission: Secondary | ICD-10-CM | POA: Diagnosis not present

## 2017-05-17 DIAGNOSIS — Z9114 Patient's other noncompliance with medication regimen: Secondary | ICD-10-CM | POA: Diagnosis not present

## 2017-05-17 DIAGNOSIS — N898 Other specified noninflammatory disorders of vagina: Secondary | ICD-10-CM

## 2017-05-17 DIAGNOSIS — G4733 Obstructive sleep apnea (adult) (pediatric): Secondary | ICD-10-CM | POA: Diagnosis not present

## 2017-05-17 DIAGNOSIS — Z79899 Other long term (current) drug therapy: Secondary | ICD-10-CM

## 2017-05-17 LAB — POCT URINALYSIS DIP (MANUAL ENTRY)
Bilirubin, UA: NEGATIVE
Glucose, UA: >=1000 mg/dL — AB
LEUKOCYTES UA: NEGATIVE
Nitrite, UA: NEGATIVE
Protein Ur, POC: NEGATIVE mg/dL
Spec Grav, UA: 1.025 (ref 1.010–1.025)
Urobilinogen, UA: 0.2 E.U./dL
pH, UA: 5.5 (ref 5.0–8.0)

## 2017-05-17 LAB — POC MICROSCOPIC URINALYSIS (UMFC)

## 2017-05-17 LAB — POCT WET + KOH PREP: Trich by wet prep: ABSENT

## 2017-05-17 LAB — POCT GLYCOSYLATED HEMOGLOBIN (HGB A1C): Hemoglobin A1C: >14

## 2017-05-17 MED ORDER — LOSARTAN POTASSIUM 50 MG PO TABS: 50.0000 mg | ORAL_TABLET | Freq: Every day | ORAL | 3 refills | Status: DC

## 2017-05-17 MED ORDER — SERTRALINE HCL 100 MG PO TABS: 200.0000 mg | ORAL_TABLET | Freq: Every day | ORAL | 3 refills | Status: DC

## 2017-05-17 MED ORDER — CANAGLIFLOZIN-METFORMIN HCL ER 50-1000 MG PO TB24: 2.0000 | ORAL_TABLET | Freq: Every day | ORAL | 1 refills | Status: DC

## 2017-05-17 MED ORDER — LIRAGLUTIDE 18 MG/3ML ~~LOC~~ SOPN: 1.8000 mg | PEN_INJECTOR | Freq: Every day | SUBCUTANEOUS | 1 refills | Status: DC

## 2017-05-17 MED ORDER — FLUCONAZOLE 150 MG PO TABS: 150.0000 mg | ORAL_TABLET | Freq: Once | ORAL | 0 refills | Status: AC

## 2017-05-17 MED ORDER — ATORVASTATIN CALCIUM 40 MG PO TABS: 40.0000 mg | ORAL_TABLET | Freq: Every day | ORAL | 3 refills | Status: DC

## 2017-05-17 MED ORDER — SITAGLIPTIN PHOSPHATE 100 MG PO TABS: 100.0000 mg | ORAL_TABLET | Freq: Every day | ORAL | 1 refills | Status: DC

## 2017-05-17 NOTE — Progress Notes (Addendum)
Subjective:  By signing my name below, I, Brittany [name], attest that this documentation has been prepared under the direction and in the presence of Brittany SorensonEva Andry Bogden, MD Electronically Signed: Charline BillsEssence [name], ED Scribe 05/17/2017 at 11:36 AM.   Patient ID: Brittany [name], female    DOB: 1960-09-26, 56 y.o.   MRN: [id number]  Chief Complaint  Patient presents with  . Diabetes   HPI  Brittany [name] is a 56 y.o. female who presents to Primary Care at [facility] for a diabetic follow-up. Stopped pravastatin and started atorvastatin in November. LFTs 6 weeks later were normal. Pt is fasting at this visit.   Pt does not check her BP or blood glucose outside of the office though she does have a home glucometer. She has been taking her medications but states that she thought she ran out of her Victoza 1 month ago until she found it in the back of a drawer and restarted it 1 week ago. Denies side-effects from Victoza other than feeling "queasy" with first restarting medication. Pt was on Tradjenta until last visit but she is unsure why this medication was discontinued. She is taking all of her medication at night and is using a medication box that she fills every Sunday. Pt reports falling asleep after dinner around 8 PM but states she often falls asleep before she remembers to take her medication. She falls asleep on the couch and remembers to take her medication when moving to the bedroom or going to the restroom. States she has been sleeping on the couch most nights since her father with Alzheimer's believes that she is her mother since her mother passed this year. Pt has not been wearing her cpap; states she wakes up a lot to readjust the chin strap.   Wt Readings from Last 3 Encounters:  05/17/17 148 lb 12.8 oz (67.5 kg)  03/04/17 148 lb (67.1 kg)  11/27/16 155 lb (70.3 kg)   Vaginal Itching Pt reports severe vaginal itching. She denies any urinary symptoms. Pt has applied Vaseline to the outer region with  temporary relief.   Past Medical History:  Diagnosis Date  . Depression   . Diabetes mellitus without complication (HCC)   . Fibromyalgia   . High cholesterol    Current Outpatient Prescriptions on File Prior to Visit  Medication Sig Dispense Refill  . albuterol (PROVENTIL) (2.5 MG/3ML) 0.083% nebulizer solution Take 3 mLs (2.5 mg total) by nebulization every 6 (six) hours as needed for wheezing or shortness of breath. 150 mL 1  . Albuterol Sulfate (PROAIR RESPICLICK) 108 (90 Base) MCG/ACT AEPB Inhale 2 puffs into the lungs every 4 (four) hours as needed. 1 each 2  . aspirin 325 MG tablet Take 325 mg by mouth daily.    Marland Kitchen. atorvastatin (LIPITOR) 40 MG tablet Take 1 tablet (40 mg total) by mouth daily. 90 tablet 3  . B Complex-Biotin-FA (MULTI-B COMPLEX) CAPS Take one a day ,B complex  multivitamin over the counter or use prenatal vitamin. 90 capsule 0  . BD PEN NEEDLE NANO U/F 32G X 4 MM MISC USE WITH VICTOZA PEN 100 each 3  . cholecalciferol (VITAMIN D) 1000 UNITS tablet Take 1,000 Units by mouth daily.    Marland Kitchen. liraglutide (VICTOZA) 18 MG/3ML SOPN Inject 0.3 mLs (1.8 mg total) into the skin daily. 10 pen 1  . losartan (COZAAR) 50 MG tablet Take 1 tablet (50 mg total) by mouth daily. 90 tablet 3  . metFORMIN (GLUCOPHAGE XR) 500 MG  24 hr tablet Take 4 tablets (2,000 mg total) by mouth daily with breakfast. 360 tablet 1  . primidone (MYSOLINE) 250 MG tablet Take 1 tablet (250 mg total) by mouth at bedtime. 90 tablet 3  . sertraline (ZOLOFT) 100 MG tablet Take 2 tablets (200 mg total) by mouth at bedtime. 180 tablet 3  . benzonatate (TESSALON) 200 MG capsule Take 1 capsule (200 mg total) by mouth 3 (three) times daily as needed for cough. (Patient not taking: Reported on 05/17/2017) 40 capsule 0   Current Facility-Administered Medications on File Prior to Visit  Medication Dose Route Frequency Provider Last Rate Last Dose  . gadopentetate dimeglumine (MAGNEVIST) injection 15 mL  15 mL Intravenous  Once PRN Dohmeier, Porfirio Mylar, MD       No Known Allergies  Past Surgical History:  Procedure Laterality Date  . CHOLECYSTECTOMY    . SPINE SURGERY    . TONSILLECTOMY     Family History  Problem Relation Age of Onset  . Cancer Mother   . Diabetes Mother   . Heart disease Mother   . Parkinsonism Mother   . Diabetes Father   . Heart disease Father   . Dementia Father   . Cancer Maternal Grandmother 62       lung  . COPD Maternal Aunt    Social History   Social History  . Marital status: Married    Spouse name: Brittany [name]  . Number of children: 2  . Years of education: 48   Social History Main Topics  . Smoking status: Current Every Day Smoker    Packs/day: 1.00    Years: 2.00    Types: Cigarettes  . Smokeless tobacco: Never Used  . Alcohol use Yes     Comment: rarely  . Drug use: No  . Sexual activity: No   Other Topics Concern  . None   Social History Narrative   Patient is married ([name]) and lives at home with her family   Patient has two children.   Patient works at [employer].   Patient has a college education.   Patient is right-handed.   Patient drinks 3 cups of coffee M-F.         Depression screen [facility] 2/9 05/17/2017 07/13/2016 12/23/2015 12/13/2015 11/16/2015  Decreased Interest 0 1 0 0 0  Down, Depressed, Hopeless 0 1 0 0 0  PHQ - 2 Score 0 2 0 0 0  Altered sleeping - 0 - - -  Tired, decreased energy - 0 - - -  Change in appetite - 1 - - -  Feeling bad or failure about yourself  - 0 - - -  Trouble concentrating - 0 - - -  Moving slowly or fidgety/restless - 0 - - -  Suicidal thoughts - 0 - - -  PHQ-9 Score - 3 - - -  Difficult doing work/chores - Not difficult at all - - -    Review of Systems  Genitourinary: Negative for difficulty urinating, dysuria, frequency and urgency.      Objective:   Physical Exam  Constitutional: She is oriented to person, place, and time. She appears well-developed and well-nourished. No distress.  HENT:  Head:  Normocephalic and atraumatic.  Eyes: Conjunctivae and EOM are normal.  Neck: Neck supple. No tracheal deviation present.  Cardiovascular: Normal rate, regular rhythm and normal heart sounds.   Pulmonary/Chest: Effort normal and breath sounds normal. No respiratory distress.  Musculoskeletal: Normal range of motion.  Neurological: She  is alert and oriented to person, place, and time.  Skin: Skin is warm and dry.  Psychiatric: She has a normal mood and affect. Her behavior is normal.  Nursing note and vitals reviewed.  BP 138/84   Pulse 92   Temp 99.3 F (37.4 C) (Oral)   Resp 16   Ht 5' 3.25" (1.607 m)   Wt 148 lb 12.8 oz (67.5 kg)   SpO2 95%   BMI 26.15 kg/m     Results for orders placed or performed in visit on 05/17/17  POCT glycosylated hemoglobin (Hb A1C)  Result Value Ref Range   Hemoglobin A1C >14.0    Assessment & Plan:    1. Type 2 diabetes mellitus with other neurologic complication, without long-term current use of insulin (HCC) - Start Invokana 100, increase to 300 if not at treatment goal at follow-up.  Cont (restart) Victoza and DPP4 inh (change brand due to insurance per Epic report of what is covered).  2. Vaginal itching   3. Essential hypertension, benign   4. Recurrent major depressive disorder, in partial remission (HCC)   5. Hyperlipidemia LDL goal <70   6. Polypharmacy   7. Poor compliance with CPAP treatment - trouble shooted how to restart/increase compliance  8. OSA and COPD overlap syndrome (HCC)     Orders Placed This Encounter  Procedures  . Comprehensive metabolic panel    Order Specific Question:   Has the patient fasted?    Answer:   Yes  . Lipid panel    Order Specific Question:   Has the patient fasted?    Answer:   Yes  . Microalbumin/Creatinine Ratio, Urine  . POCT glycosylated hemoglobin (Hb A1C)  . POCT urinalysis dipstick  . POCT Microscopic Urinalysis ([facility])  . POCT Wet + KOH Prep  . HM Diabetes Foot Exam    Meds ordered  this encounter  Medications  . fluconazole (DIFLUCAN) 150 MG tablet    Sig: Take 1 tablet (150 mg total) by mouth once. Repeat if needed after 3 days    Dispense:  2 tablet    Refill:  0  . Canagliflozin-Metformin HCl ER (INVOKAMET XR) 50-1000 MG TB24    Sig: Take 2 tablets by mouth daily.    Dispense:  180 tablet    Refill:  1  . sertraline (ZOLOFT) 100 MG tablet    Sig: Take 2 tablets (200 mg total) by mouth at bedtime.    Dispense:  180 tablet    Refill:  3  . losartan (COZAAR) 50 MG tablet    Sig: Take 1 tablet (50 mg total) by mouth daily.    Dispense:  90 tablet    Refill:  3  . liraglutide (VICTOZA) 18 MG/3ML SOPN    Sig: Inject 0.3 mLs (1.8 mg total) into the skin daily.    Dispense:  10 pen    Refill:  1  . atorvastatin (LIPITOR) 40 MG tablet    Sig: Take 1 tablet (40 mg total) by mouth daily.    Dispense:  90 tablet    Refill:  3  . sitaGLIPtin (JANUVIA) 100 MG tablet    Sig: Take 1 tablet (100 mg total) by mouth daily.    Dispense:  90 tablet    Refill:  1    I personally performed the services described in this documentation, which was scribed in my presence. The recorded information has been reviewed and considered, and addended by me as needed.   Brittany [name],  M.D.  Primary Care at [facility] [address] [place], [place] [address] [phone] phone [phone] fax  05/19/17 11:46 PM

## 2017-05-17 NOTE — Patient Instructions (Addendum)
IF you received an x-ray today, you will receive an invoice from [facility]. Please contact [facility] at [phone] with questions or concerns regarding your invoice.   IF you received labwork today, you will receive an invoice from [facility]. Please contact [facility] at [phone] with questions or concerns regarding your invoice.   Our billing staff will not be able to assist you with questions regarding bills from these companies.  You will be contacted with the lab results as soon as they are available. The fastest way to get your results is to activate your My Chart account. Instructions are located on the last page of this paperwork. If you have not heard from [place] regarding the results in 2 weeks, please contact this office.     How to Avoid Diabetes Mellitus Problems You can take action to prevent or slow down problems that are caused by diabetes (diabetes mellitus). Following your diabetes plan and taking care of yourself can reduce your risk of serious or life-threatening complications. Manage your diabetes  Follow instructions from your health care providers about managing your diabetes. Your diabetes may be managed by a team of health care providers who can teach you how to care for yourself and can answer questions that you have.  Educate yourself about your condition so you can make healthy choices about eating and physical activity.  Check your blood sugar (glucose) levels as often as directed. Your health care provider will help you decide how often to check your blood glucose level depending on your treatment goals and how well you are meeting them.  Ask your health care provider if you should take low-dose aspirin daily and what dose is recommended for you. Taking low-dose aspirin daily is recommended to help prevent cardiovascular disease. Do not use nicotine or tobacco Do not use any products that contain nicotine or tobacco, such as cigarettes  and e-cigarettes. If you need help quitting, ask your health care provider. Nicotine raises your risk for diabetes problems. If you quit using nicotine:  You will lower your risk for heart attack, stroke, nerve disease, and kidney disease.  Your cholesterol and blood pressure may improve.  Your blood circulation will improve.  Keep your blood pressure under control To control your blood pressure:  Follow instructions from your health care provider about meal planning, exercise, and medicines.  Make sure your health care provider checks your blood pressure at every medical visit.  A blood pressure reading consists of two numbers. Generally, the goal is to keep your top number (systolic pressure) at or below 130, and your bottom number (diastolic pressure) at or below 80. Your health care provider may recommend a lower target blood pressure. Your individualized target blood pressure is determined based on:  Your age.  Your medicines.  How long you have had diabetes.  Any other medical conditions you have.  Keep your cholesterol under control To control your cholesterol:  Follow instructions from your health care provider about meal planning, exercise, and medicines.  Have your cholesterol checked at least once a year.  You may be prescribed medicine to lower cholesterol (statin). If you are not taking a statin, ask your health care provider if you should be.  Controlling your cholesterol may:  Help prevent heart disease and stroke. These are the most common health problems for people with diabetes.  Improve your blood flow.  Schedule and keep yearly physical exams and eye exams Your health care provider will tell you how  often you need medical visits depending on your diabetes management plan. Keep all follow-up visits as directed. This is important so possible problems can be identified early and complications can be avoided or treated.  Every visit with your health care  provider should include measuring your: ? Weight. ? Blood pressure. ? Blood glucose control.  Your A1c (hemoglobin A1c) level should be checked: ? At least 2 times a year, if you are meeting your treatment goals. ? 4 times a year, if you are not meeting treatment goals or if your treatment goals have changed.  Your blood lipids (lipid profile) should be checked yearly. You should also be checked yearly for protein in your urine (urine microalbumin).  If you have type 1 diabetes, get an eye exam 3-5 years after you are diagnosed, and then once a year after your first exam.  If you have type 2 diabetes, get an eye exam as soon as you are diagnosed, and then once a year after your first exam.  Keep your vaccines current It is recommended that you receive:  pneumonia (pneumococcal) vaccine and a hepatitis B vaccine. If you are age 63 or older, you may get the pneumonia vaccine as a series of two separate shots.  Ask your health care provider which other vaccines may be recommended. Take care of your feet Diabetes may cause you to have poor blood circulation to your legs and feet. Because of this, taking care of your feet is very important. Diabetes can cause:  The skin on the feet to get thinner, break more easily, and heal more slowly.  Nerve damage in your legs and feet, which results in decreased feeling. You may not notice minor injuries that could lead to serious problems.  To avoid foot problems:  Check your skin and feet every day for cuts, bruises, redness, blisters, or sores.  Schedule a foot exam with your health care provider once every year. This exam includes: ? Inspecting of the structure and skin of your feet. ? Checking the pulses and sensation in your feet.  Make sure that your health care provider performs a visual foot exam at every medical visit.  Take care of your teeth People with poorly controlled diabetes are more likely to have gum (periodontal) disease.  Diabetes can make periodontal diseases harder to control. If not treated, periodontal diseases can lead to tooth loss. To prevent this:  Brush your teeth twice a day.  Floss at least once a day.  Visit your dentist 2 times a year.  Drink responsibly Limit alcohol intake to no more than 1 drink a day for nonpregnant women and 2 drinks a day for men. One drink equals 12 oz of beer, 5 oz of wine, or 1 oz of hard liquor. It is important to eat food when you drink alcohol to avoid low blood glucose (hypoglycemia). Avoid alcohol if you:  Have a history of alcohol abuse or dependence.  Are pregnant.  Have liver disease, pancreatitis, advanced neuropathy, or severe hypertriglyceridemia.  Lessen stress Living with diabetes can be stressful. When you are experiencing stress, your blood glucose may be affected in two ways:  Stress hormones may cause your blood glucose to rise.  You may be distracted from taking good care of yourself.  Be aware of your stress level and make changes to help you manage challenging situations. To lower your stress levels:  Consider joining a support group.  Do planned relaxation or meditation.  Do a hobby that you  enjoy.  Maintain healthy relationships.  Exercise regularly.  Work with your health care provider or a mental health professional.  Summary  You can take action to prevent or slow down problems that are caused by diabetes (diabetes mellitus). Following your diabetes plan and taking care of yourself can reduce your risk of serious or life-threatening complications.  Follow instructions from your health care providers about managing your diabetes. Your diabetes may be managed by a team of health care providers who can teach you how to care for yourself and can answer questions that you have.  Your health care provider will tell you how often you need medical visits depending on your diabetes management plan. Keep all follow-up visits as  directed. This is important so possible problems can be identified early and complications can be avoided or treated. This information is not intended to replace advice given to you by your health care provider. Make sure you discuss any questions you have with your health care provider. Document Released: 05/14/2011 Document Revised: 05/25/2016 Document Reviewed: 05/25/2016 Elsevier Interactive Patient Education  2018 [place].    Blood Glucose Monitoring, Adult Monitoring your blood sugar (glucose) helps you manage your diabetes. It also helps you and your health care provider determine how well your diabetes management plan is working. Blood glucose monitoring involves checking your blood glucose as often as directed, and keeping a record (log) of your results over time. Why should I monitor my blood glucose? Checking your blood glucose regularly can:  Help you understand how food, exercise, illnesses, and medicines affect your blood glucose.  Let you know what your blood glucose is at any time. You can quickly tell if you are having low blood glucose (hypoglycemia) or high blood glucose (hyperglycemia).  Help you and your health care provider adjust your medicines as needed.  When should I check my blood glucose? Follow instructions from your health care provider about how often to check your blood glucose. This may depend on:  The type of diabetes you have.  How well-controlled your diabetes is.  Medicines you are taking.  If you have type 1 diabetes:  Check your blood glucose at least 2 times a day.  Also check your blood glucose: ? Before every insulin injection. ? Before and after exercise. ? Between meals. ? 2 hours after a meal. ? Occasionally between 2:00 a.m. and 3:00 a.m., as directed. ? Before potentially dangerous tasks, like driving or using heavy machinery. ? At bedtime.  You may need to check your blood glucose more often, up to 6-10 times a day: ? If  you use an insulin pump. ? If you need multiple daily injections (MDI). ? If your diabetes is not well-controlled. ? If you are ill. ? If you have a history of severe hypoglycemia. ? If you have a history of not knowing when your blood glucose is getting low (hypoglycemia unawareness). If you have type 2 diabetes:  If you take insulin or other diabetes medicines, check your blood glucose at least 2 times a day.  If you are on intensive insulin therapy, check your blood glucose at least 4 times a day. Occasionally, you may also need to check between 2:00 a.m. and 3:00 a.m., as directed.  Also check your blood glucose: ? Before and after exercise. ? Before potentially dangerous tasks, like driving or using heavy machinery.  You may need to check your blood glucose more often if: ? Your medicine is being adjusted. ? Your diabetes is  not well-controlled. ? You are ill. What is a blood glucose log?  A blood glucose log is a record of your blood glucose readings. It helps you and your health care provider: ? Look for patterns in your blood glucose over time. ? Adjust your diabetes management plan as needed.  Every time you check your blood glucose, write down your result and notes about things that may be affecting your blood glucose, such as your diet and exercise for the day.  Most glucose meters store a record of glucose readings in the meter. Some meters allow you to download your records to a computer. How do I check my blood glucose? Follow these steps to get accurate readings of your blood glucose: Supplies needed   Blood glucose meter.  Test strips for your meter. Each meter has its own strips. You must use the strips that come with your meter.  A needle to prick your finger (lancet). Do not use lancets more than once.  A device that holds the lancet (lancing device).  A journal or log book to write down your results. Procedure  Wash your hands with soap and  water.  Prick the side of your finger (not the tip) with the lancet. Use a different finger each time.  Gently rub the finger until a small drop of blood appears.  Follow instructions that come with your meter for inserting the test strip, applying blood to the strip, and using your blood glucose meter.  Write down your result and any notes. Alternative testing sites  Some meters allow you to use areas of your body other than your finger (alternative sites) to test your blood.  If you think you may have hypoglycemia, or if you have hypoglycemia unawareness, do not use alternative sites. Use your finger instead.  Alternative sites may not be as accurate as the fingers, because blood flow is slower in these areas. This means that the result you get may be delayed, and it may be different from the result that you would get from your finger.  The most common alternative sites are: ? Forearm. ? Thigh. ? Palm of the hand. Additional tips  Always keep your supplies with you.  If you have questions or need help, all blood glucose meters have a 24-hour "hotline" number that you can call. You may also contact your health care provider.  After you use a few boxes of test strips, adjust (calibrate) your blood glucose meter by following instructions that came with your meter. This information is not intended to replace advice given to you by your health care provider. Make sure you discuss any questions you have with your health care provider. Document Released: 08/29/2003 Document Revised: 03/15/2016 Document Reviewed: 02/05/2016 Elsevier Interactive Patient Education  2017 [place].  Diabetes Mellitus and Standards of Medical Care Managing diabetes (diabetes mellitus) can be complicated. Your diabetes treatment may be managed by a team of health care providers, including:  A diet and nutrition specialist (registered dietitian).  A nurse.  A certified diabetes educator (CDE).  A  diabetes specialist (endocrinologist).  An eye doctor.  A primary care provider.  A dentist.  Your health care providers follow a schedule in order to help you get the best quality of care. The following schedule is a general guideline for your diabetes management plan. Your health care providers may also give you more specific instructions. HbA1c ( hemoglobin A1c) test This test provides information about blood sugar (glucose) control over the previous  2-3 months. It is used to check whether your diabetes management plan needs to be adjusted.  If you are meeting your treatment goals, this test is done at least 2 times a year.  If you are not meeting treatment goals or if your treatment goals have changed, this test is done 4 times a year.  Blood pressure test  This test is done at every routine medical visit. For most people, the goal is less than 130/80. Ask your health care provider what your goal blood pressure should be. Dental and eye exams  Visit your dentist two times a year.  If you have type 1 diabetes, get an eye exam 3-5 years after you are diagnosed, and then once a year after your first exam. ? If you were diagnosed with type 1 diabetes as a child, get an eye exam when you are age 34 or older and have had diabetes for 3-5 years. After the first exam, you should get an eye exam once a year.  If you have type 2 diabetes, have an eye exam as soon as you are diagnosed, and then once a year after your first exam. Foot care exam  Visual foot exams are done at every routine medical visit. The exams check for cuts, bruises, redness, blisters, sores, or other problems with the feet.  A complete foot exam is done by your health care provider once a year. This exam includes an inspection of the structure and skin of your feet, and a check of the pulses and sensation in your feet. ? Type 1 diabetes: Get your first exam 3-5 years after diagnosis. ? Type 2 diabetes: Get your first  exam as soon as you are diagnosed.  Check your feet every day for cuts, bruises, redness, blisters, or sores. If you have any of these or other problems that are not healing, contact your health care provider. Kidney function test ( urine microalbumin)  This test is done once a year. ? Type 1 diabetes: Get your first test 5 years after diagnosis. ? Type 2 diabetes: Get your first test as soon as you are diagnosed.  If you have chronic kidney disease (CKD), get a serum creatinine and estimated glomerular filtration rate (eGFR) test once a year. Lipid profile (cholesterol, HDL, LDL, triglycerides)  This test should be done when you are diagnosed with diabetes, and every 5 years after the first test. If you are on medicines to lower your cholesterol, you may need to get this test done every year. ? The goal for LDL is less than 100 mg/dL (5.5 mmol/L). If you are at high risk, the goal is less than 70 mg/dL (3.9 mmol/L). ? The goal for HDL is 40 mg/dL (2.2 mmol/L) for men and 50 mg/dL(2.8 mmol/L) for women. An HDL cholesterol of 60 mg/dL (3.3 mmol/L) or higher gives some protection against heart disease. ? The goal for triglycerides is less than 150 mg/dL (8.3 mmol/L). Immunizations  The yearly flu (influenza) vaccine is recommended for everyone 6 months or older who has diabetes.  The pneumonia (pneumococcal) vaccine is recommended for everyone 2 years or older who has diabetes. If you are 92 or older, you may get the pneumonia vaccine as a series of two separate shots.  The hepatitis B vaccine is recommended for adults shortly after they have been diagnosed with diabetes.  The Tdap (tetanus, diphtheria, and pertussis) vaccine should be given: ? According to normal childhood vaccination schedules, for children. ? Every 10 years, for  adults who have diabetes.  The shingles vaccine is recommended for people who have had chicken pox and are 50 years or older. Mental and emotional  health  Screening for symptoms of eating disorders, anxiety, and depression is recommended at the time of diagnosis and afterward as needed. If your screening shows that you have symptoms (you have a positive screening result), you may need further evaluation and be referred to a mental health care provider. Diabetes self-management education  Education about how to manage your diabetes is recommended at diagnosis and ongoing as needed. Treatment plan  Your treatment plan will be reviewed at every medical visit. Summary  Managing diabetes (diabetes mellitus) can be complicated. Your diabetes treatment may be managed by a team of health care providers.  Your health care providers follow a schedule in order to help you get the best quality of care.  Standards of care including having regular physical exams, blood tests, blood pressure monitoring, immunizations, screening tests, and education about how to manage your diabetes.  Your health care providers may also give you more specific instructions based on your individual health. This information is not intended to replace advice given to you by your health care provider. Make sure you discuss any questions you have with your health care provider. Document Released: 06/23/2009 Document Revised: 05/24/2016 Document Reviewed: 05/24/2016 Elsevier Interactive Patient Education  [employer].

## 2017-05-18 LAB — LIPID PANEL
Chol/HDL Ratio: 2.8 ratio (ref 0.0–4.4)
Cholesterol, Total: 156 mg/dL (ref 100–199)
HDL: 55 mg/dL (ref >39)
LDL Calculated: 81 mg/dL (ref 0–99)
Triglycerides: 100 mg/dL (ref 0–149)
VLDL CHOLESTEROL CAL: 20 mg/dL (ref 5–40)

## 2017-05-18 LAB — COMPREHENSIVE METABOLIC PANEL
A/G RATIO: 1.8 (ref 1.2–2.2)
ALBUMIN: 4.4 g/dL (ref 3.5–5.5)
ALT: 13 IU/L (ref 0–32)
AST: 16 IU/L (ref 0–40)
Alkaline Phosphatase: 150 IU/L — ABNORMAL HIGH (ref 39–117)
BUN / CREAT RATIO: 21 (ref 9–23)
BUN: 12 mg/dL (ref 6–24)
Bilirubin Total: 0.5 mg/dL (ref 0.0–1.2)
CALCIUM: 9.6 mg/dL (ref 8.7–10.2)
CO2: 22 mmol/L (ref 20–29)
CREATININE: 0.57 mg/dL (ref 0.57–1.00)
Chloride: 98 mmol/L (ref 96–106)
GFR, EST AFRICAN AMERICAN: 118 mL/min/{1.73_m2} (ref >59)
GFR, EST NON AFRICAN AMERICAN: 103 mL/min/{1.73_m2} (ref >59)
GLOBULIN, TOTAL: 2.5 g/dL (ref 1.5–4.5)
Glucose: 304 mg/dL — ABNORMAL HIGH (ref 65–99)
Potassium: 4.2 mmol/L (ref 3.5–5.2)
Sodium: 138 mmol/L (ref 134–144)
TOTAL PROTEIN: 6.9 g/dL (ref 6.0–8.5)

## 2017-05-18 LAB — MICROALBUMIN / CREATININE URINE RATIO
CREATININE, UR: 109.2 mg/dL
MICROALBUM., U, RANDOM: 19.8 ug/mL
Microalb/Creat Ratio: 18.1 mg/g creat (ref 0.0–30.0)

## 2017-05-19 ENCOUNTER — Telehealth: Payer: Self-pay | Admitting: Family Medicine

## 2017-05-19 NOTE — Telephone Encounter (Signed)
PHARMACY STATING THAT PT NEED A 3 MONTH SUPPLY ON VICTOZA 27 ML INJECTION THE 1 BOX CONTAINS 9 ML AND THAT DR SHAW STATED PT NEED 10 PENS PLEASE CALL PHARMACY

## 2017-05-19 NOTE — Telephone Encounter (Signed)
Please advise 

## 2017-05-21 NOTE — Telephone Encounter (Signed)
That's fine. Brittany [name] already sent note back to insurance.

## 2017-05-22 ENCOUNTER — Other Ambulatory Visit: Payer: Self-pay | Admitting: Family Medicine

## 2017-07-30 ENCOUNTER — Encounter: Payer: Self-pay | Admitting: Family Medicine

## 2017-07-30 ENCOUNTER — Ambulatory Visit (INDEPENDENT_AMBULATORY_CARE_PROVIDER_SITE_OTHER): Payer: BLUE CROSS/BLUE SHIELD | Admitting: Family Medicine

## 2017-07-30 VITALS — BP 132/80 | HR 114 | Temp 98.2°F | Resp 17 | Ht 63.0 in | Wt 145.0 lb

## 2017-07-30 DIAGNOSIS — R634 Abnormal weight loss: Secondary | ICD-10-CM | POA: Diagnosis not present

## 2017-07-30 DIAGNOSIS — R110 Nausea: Secondary | ICD-10-CM

## 2017-07-30 DIAGNOSIS — L282 Other prurigo: Secondary | ICD-10-CM

## 2017-07-30 DIAGNOSIS — R200 Anesthesia of skin: Secondary | ICD-10-CM | POA: Diagnosis not present

## 2017-07-30 DIAGNOSIS — E1149 Type 2 diabetes mellitus with other diabetic neurological complication: Secondary | ICD-10-CM | POA: Diagnosis not present

## 2017-07-30 DIAGNOSIS — N898 Other specified noninflammatory disorders of vagina: Secondary | ICD-10-CM

## 2017-07-30 LAB — POCT CBC
Granulocyte percent: 55.1 %G (ref 37–80)
HCT, POC: 44.8 % (ref 37.7–47.9)
HEMOGLOBIN: 15.4 g/dL (ref 12.2–16.2)
LYMPH, POC: 3.3 (ref 0.6–3.4)
MCH, POC: 30.6 pg (ref 27–31.2)
MCHC: 34.3 g/dL (ref 31.8–35.4)
MCV: 89.2 fL (ref 80–97)
MID (cbc): 0.6 (ref 0–0.9)
MPV: 7.7 fL (ref 0–99.8)
PLATELET COUNT, POC: 257 10*3/uL (ref 142–424)
POC Granulocyte: 4.7 (ref 2–6.9)
POC LYMPH %: 38.1 % (ref 10–50)
POC MID %: 6.8 %M (ref 0–12)
RBC: 5.02 M/uL (ref 4.04–5.48)
RDW, POC: 12.1 %
WBC: 8.6 10*3/uL (ref 4.6–10.2)

## 2017-07-30 LAB — POC MICROSCOPIC URINALYSIS (UMFC): MUCUS RE: ABSENT

## 2017-07-30 LAB — POCT WET + KOH PREP
TRICH BY WET PREP: ABSENT
Yeast by KOH: ABSENT
Yeast by wet prep: ABSENT

## 2017-07-30 LAB — POCT URINALYSIS DIP (MANUAL ENTRY)
Bilirubin, UA: NEGATIVE
Blood, UA: NEGATIVE
Glucose, UA: 500 mg/dL — AB
LEUKOCYTES UA: NEGATIVE
Nitrite, UA: NEGATIVE
PH UA: 5 (ref 5.0–8.0)
PROTEIN UA: NEGATIVE mg/dL
SPEC GRAV UA: 1.01 (ref 1.010–1.025)
UROBILINOGEN UA: 0.2 U/dL

## 2017-07-30 LAB — GLUCOSE, POCT (MANUAL RESULT ENTRY): POC Glucose: 341 mg/dl — AB (ref 70–99)

## 2017-07-30 MED ORDER — ESTRADIOL 0.1 MG/GM VA CREA: TOPICAL_CREAM | VAGINAL | 3 refills | Status: DC

## 2017-07-30 MED ORDER — FLUCONAZOLE 150 MG PO TABS: 150.0000 mg | ORAL_TABLET | Freq: Once | ORAL | 0 refills | Status: AC

## 2017-07-30 NOTE — Patient Instructions (Addendum)
   IF you received an x-ray today, you will receive an invoice from [facility]. Please contact [facility] at [phone] with questions or concerns regarding your invoice.   IF you received labwork today, you will receive an invoice from [facility]. Please contact [facility] at [phone] with questions or concerns regarding your invoice.   Our billing staff will not be able to assist you with questions regarding bills from these companies.  You will be contacted with the lab results as soon as they are available. The fastest way to get your results is to activate your My Chart account. Instructions are located on the last page of this paperwork. If you have not heard from us regarding the results in 2 weeks, please contact this office.     Atrophic Vaginitis Atrophic vaginitis is a condition in which the tissues that line the vagina become dry and thin. This condition is most common in women who have stopped having regular menstrual periods (menopause). This usually starts when a woman is 45-55 years old. Estrogen helps to keep the vagina moist. It stimulates the vagina to produce a clear fluid that lubricates the vagina for sexual intercourse. This fluid also protects the vagina from infection. Lack of estrogen can cause the lining of the vagina to get thinner and dryer. The vagina may also shrink in size. It may become less elastic. Atrophic vaginitis tends to get worse over time as a woman's estrogen level drops. What are the causes? This condition is caused by the normal drop in estrogen that happens around the time of menopause. What increases the risk? Certain conditions or situations may lower a woman's estrogen level, which increases her risk of atrophic vaginitis. These include:  Taking medicine that blocks estrogen.  Having ovaries removed surgically.  Being treated for cancer with X-ray treatment (radiation) or medicines (chemotherapy).  Exercising very  hard and often.  Having an eating disorder (anorexia).  Giving birth or breastfeeding.  Being over the age of 50.  Smoking.  What are the signs or symptoms? Symptoms of this condition include:  Pain, soreness, or bleeding during sexual intercourse (dyspareunia).  Vaginal burning, irritation, or itching.  Pain or bleeding during a vaginal examination using a speculum (pelvic exam).  Loss of interest in sexual activity.  Having burning pain when passing urine.  Vaginal discharge that is brown or yellow.  In some cases, there are no symptoms. How is this diagnosed? This condition is diagnosed with a medical history and physical exam. This will include a pelvic exam that checks whether the inside of your vagina appears pale, thin, or dry. Rarely, you may also have other tests, including:  A urine test.  A test that checks the acid balance in your vaginal fluid (acid balance test).  How is this treated? Treatment for this condition may depend on the severity of your symptoms. Treatment may include:  Using an over-the-counter vaginal lubricant before you have sexual intercourse.  Using a long-acting vaginal moisturizer.  Using low-dose vaginal estrogen for moderate to severe symptoms that do not respond to other treatments. Options include creams, tablets, and inserts (vaginal rings). Before using vaginal estrogen, tell your health care provider if you have a history of: ? Breast cancer. ? Endometrial cancer. ? Blood clots.  Taking medicines. You may be able to take a daily pill for dyspareunia. Discuss all of the risks of this medicine with your health care provider. It is usually not recommended for women who have a family   history or personal history of breast cancer.  If your symptoms are very mild and you are not sexually active, you may not need treatment. Follow these instructions at home:  Take medicines only as directed by your health care provider. Do not use  herbal or alternative medicines unless your health care provider says that you can.  Use over-the-counter creams, lubricants, or moisturizers for dryness only as directed by your health care provider.  If your atrophic vaginitis is caused by menopause, discuss all of your menopausal symptoms and treatment options with your health care provider.  Do not douche.  Do not use products that can make your vagina dry. These include: ? Scented feminine sprays. ? Scented tampons. ? Scented soaps.  If it hurts to have sex, talk with your sexual partner. Contact a health care provider if:  Your discharge looks different than normal.  Your vagina has an unusual smell.  You have new symptoms.  Your symptoms do not improve with treatment.  Your symptoms get worse. This information is not intended to replace advice given to you by your health care provider. Make sure you discuss any questions you have with your health care provider. Document Released: 01/10/2015 Document Revised: 02/01/2016 Document Reviewed: 08/17/2014 Elsevier Interactive Patient Education  2018 [employer]  

## 2017-07-30 NOTE — Progress Notes (Addendum)
Subjective:  By signing my name below, I, Brittany [name], attest that this documentation has been prepared under the direction and in the presence of Norberto SorensonEva Twania Bujak, MD Electronically Signed: Charline BillsEssence [name], ED Scribe 07/30/2017 at 5:10 PM.   Patient ID: Brittany Campusenise W Crudup, female    DOB: 1960-09-21, 56 y.o.   MRN: [id number]  Chief Complaint  Patient presents with  . Vaginal Itching   HPI Brittany CampusDenise W [name] is a 56 y.o. female who [facility] complaining of intermittent vaginal itching to the outer region x 2 months. Pt states that she was given diflucan at last visit on 05/17/17 which temporarily stopped itching. She has tried lubrication, Monistat and Vagisil without relief, Vaseline with temporary relief.  Denies rash, vaginal discharge.  DM Pt states Metformin dose was changed last visit, which made her sick the first 2 days so she stopped it. She does however admit that she was taking previous Metformin and newly prescribed Metformin. When she tried to restart medication, the smell of it made her nauseous so she has been off of Metformin, Invokamet and Victoza x 1 month. Also states that she is off all of her other medications as well for depression.   R Thumb Numbness Pt reports R thumb numbness x 1 month ago. She reports complete loss of sensation from the IP joint distally. No known injury, rash, color change, burning pain. No treatments tried PTA.  Past Medical History:  Diagnosis Date  . Depression   . Diabetes mellitus without complication (HCC)   . Fibromyalgia   . High cholesterol    Current Outpatient Medications on File Prior to Visit  Medication Sig Dispense Refill  . albuterol (PROVENTIL) (2.5 MG/3ML) 0.083% nebulizer solution Take 3 mLs (2.5 mg total) by nebulization every 6 (six) hours as needed for wheezing or shortness of breath. 150 mL 1  . Albuterol Sulfate (PROAIR RESPICLICK) 108 (90 Base) MCG/ACT AEPB Inhale 2 puffs into the lungs every 4 (four) hours  as needed. 1 each 2  . aspirin 325 MG tablet Take 325 mg by mouth daily.    Marland Kitchen. atorvastatin (LIPITOR) 40 MG tablet Take 1 tablet (40 mg total) by mouth daily. 90 tablet 3  . B Complex-Biotin-FA (MULTI-B COMPLEX) CAPS Take one a day ,B complex  multivitamin over the counter or use prenatal vitamin. 90 capsule 0  . BD PEN NEEDLE NANO U/F 32G X 4 MM MISC USE WITH VICTOZA PEN 100 each 11  . Canagliflozin-Metformin HCl ER (INVOKAMET XR) 50-1000 MG TB24 Take 2 tablets by mouth daily. 180 tablet 1  . cholecalciferol (VITAMIN D) 1000 UNITS tablet Take 1,000 Units by mouth daily.    Marland Kitchen. liraglutide (VICTOZA) 18 MG/3ML SOPN Inject 0.3 mLs (1.8 mg total) into the skin daily. 10 pen 1  . losartan (COZAAR) 50 MG tablet Take 1 tablet (50 mg total) by mouth daily. 90 tablet 3  . metFORMIN (GLUCOPHAGE) 500 MG tablet Take by mouth 4 (four) times daily.    . primidone (MYSOLINE) 250 MG tablet Take 1 tablet (250 mg total) by mouth at bedtime. 90 tablet 3  . sertraline (ZOLOFT) 100 MG tablet Take 2 tablets (200 mg total) by mouth at bedtime. 180 tablet 3  . sitaGLIPtin (JANUVIA) 100 MG tablet Take 1 tablet (100 mg total) by mouth daily. 90 tablet 1   Current Facility-Administered Medications on File Prior to Visit  Medication Dose Route Frequency Provider Last Rate Last Dose  . gadopentetate dimeglumine (MAGNEVIST)  injection 15 mL  15 mL Intravenous Once PRN Dohmeier, Porfirio Mylar, MD       No Known Allergies   Past Surgical History:  Procedure Laterality Date  . CHOLECYSTECTOMY    . SPINE SURGERY    . TONSILLECTOMY     Family History  Problem Relation Age of Onset  . Cancer Mother   . Diabetes Mother   . Heart disease Mother   . Parkinsonism Mother   . Diabetes Father   . Heart disease Father   . Dementia Father   . Cancer Maternal Grandmother 62       lung  . COPD Maternal Aunt    Social History   Socioeconomic History  . Marital status: Married    Spouse name: [name]  . Number of children: 2  .  Years of education: 81  . Highest education level: None  Social Needs  . Financial resource strain: None  . Food insecurity - worry: None  . Food insecurity - inability: None  . Transportation needs - medical: None  . Transportation needs - non-medical: None  Occupational History  . None  Tobacco Use  . Smoking status: Current Every Day Smoker    Packs/day: 1.00    Years: 2.00    Pack years: 2.00    Types: Cigarettes  . Smokeless tobacco: Never Used  Substance and Sexual Activity  . Alcohol use: Yes    Comment: rarely  . Drug use: No  . Sexual activity: No  Other Topics Concern  . None  Social History Narrative   Patient is married ([name]) and lives at home with her family   Patient has two children.   Patient works at [employer].   Patient has a college education.   Patient is right-handed.   Patient drinks 3 cups of coffee M-F.      Depression screen [facility] 2/9 07/30/2017 05/17/2017 07/13/2016 12/23/2015 12/13/2015  Decreased Interest 0 0 1 0 0  Down, Depressed, Hopeless 0 0 1 0 0  PHQ - 2 Score 0 0 2 0 0  Altered sleeping - - 0 - -  Tired, decreased energy - - 0 - -  Change in appetite - - 1 - -  Feeling bad or failure about yourself  - - 0 - -  Trouble concentrating - - 0 - -  Moving slowly or fidgety/restless - - 0 - -  Suicidal thoughts - - 0 - -  PHQ-9 Score - - 3 - -  Difficult doing work/chores - - Not difficult at all - -    Review of Systems  Genitourinary: Negative for vaginal discharge.       + Vaginal itching  Skin: Negative for color change and rash.  Neurological: Positive for numbness (R thumb).      Objective:   Physical Exam  Constitutional: She is oriented to person, place, and time. She appears well-developed and well-nourished. No distress.  HENT:  Head: Normocephalic and atraumatic.  Eyes: Conjunctivae and EOM are normal.  Neck: Neck supple. No tracheal deviation present.  Cardiovascular: Normal rate.  Pulmonary/Chest: Effort  normal. No respiratory distress.  Genitourinary:  Genitourinary Comments: Chaperone present. Excoriations along the inner aspect of labia majora particularly right at the apex of the lip. Loss of labia minora architecture effusion. Dry mucosa membranes. Splitting and cracking with pale, slightly purplish appearance with white flaking around excoriations.  Musculoskeletal: Normal range of motion.  R thumb: 5/5 strength and normal ROM. No tenderness to  palpation.   Neurological: She is alert and oriented to person, place, and time.  Skin: Skin is warm and dry.  Psychiatric: She has a normal mood and affect. Her behavior is normal.  Nursing note and vitals reviewed.  BP 132/80   Pulse (!) 114   Temp 98.2 F (36.8 C) (Oral)   Resp 17   Ht 5\' 3"  (1.6 m)   Wt 145 lb (65.8 kg)   SpO 2 98%   BMI 25.69 kg/m     Assessment & Plan:   1. 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 Start topical estrogen and recheck in several weeks.  2. Vaginal itching   3. Type 2 diabetes mellitus with other neurologic complication, without long-term current use of insulin (HCC) - has stopped all DM meds as causing nausea after unintentionally doubled up on metformin taking 4000mg /d. Restart victoza. Then perhaps will add in different sglts.  4. Nausea without vomiting   5. Weight loss   6. Numbness of right thumb - refer to hand    Orders Placed This Encounter  Procedures  . Urine Culture  . Fructosamine  . Comprehensive metabolic panel  . Lipase  . TSH  . Ambulatory referral to Hand Surgery    Referral Priority:   Routine    Referral Type:   Surgical    Referral Reason:   Specialty Services Required    Requested Specialty:   Hand Surgery    Number of Visits Requested:   1  . POCT glucose (manual entry)  . POCT CBC  . POCT urinalysis dipstick  . POCT Microscopic Urinalysis ([facility])  . POCT Wet + KOH Prep    Meds ordered this encounter  Medications    . estradiol (ESTRACE) 0.1 MG/GM vaginal cream    Sig: Apply to perineum at night x 2 weeks, then every other night.    Dispense:  42.5 g    Refill:  3  . fluconazole (DIFLUCAN) 150 MG tablet    Sig: Take 1 tablet (150 mg total) by mouth once for 1 dose. Repeat dose in 3days    Dispense:  2 tablet    Refill:  0    I personally performed the services described in this documentation, which was scribed in my presence. The recorded information has been reviewed and considered, and addended by me as needed.   Norberto SorensonEva Shenay Torti, M.D.  [facility] at [facility]  [place] [address] [place], [place] [address] [phone] phone [phone] fax  08/02/17 1:36 AM

## 2017-07-30 NOTE — Progress Notes (Signed)
Shay   

## 2017-07-31 LAB — COMPREHENSIVE METABOLIC PANEL
ALK PHOS: 154 IU/L — AB (ref 39–117)
ALT: 13 IU/L (ref 0–32)
AST: 14 IU/L (ref 0–40)
Albumin/Globulin Ratio: 1.6 (ref 1.2–2.2)
Albumin: 4.3 g/dL (ref 3.5–5.5)
BILIRUBIN TOTAL: 0.4 mg/dL (ref 0.0–1.2)
BUN/Creatinine Ratio: 14 (ref 9–23)
BUN: 8 mg/dL (ref 6–24)
CHLORIDE: 99 mmol/L (ref 96–106)
CO2: 21 mmol/L (ref 20–29)
Calcium: 9.4 mg/dL (ref 8.7–10.2)
Creatinine, Ser: 0.58 mg/dL (ref 0.57–1.00)
GFR calc non Af Amer: 102 mL/min/{1.73_m2} (ref >59)
GFR, EST AFRICAN AMERICAN: 117 mL/min/{1.73_m2} (ref >59)
GLUCOSE: 353 mg/dL — AB (ref 65–99)
Globulin, Total: 2.7 g/dL (ref 1.5–4.5)
Potassium: 3.7 mmol/L (ref 3.5–5.2)
Sodium: 136 mmol/L (ref 134–144)
TOTAL PROTEIN: 7 g/dL (ref 6.0–8.5)

## 2017-07-31 LAB — URINE CULTURE

## 2017-07-31 LAB — LIPASE: LIPASE: 52 U/L (ref 14–72)

## 2017-07-31 LAB — FRUCTOSAMINE: Fructosamine: 524 umol/L — ABNORMAL HIGH (ref 0–285)

## 2017-07-31 LAB — TSH: TSH: 1.43 u[IU]/mL (ref 0.450–4.500)

## 2017-08-25 DIAGNOSIS — M47812 Spondylosis without myelopathy or radiculopathy, cervical region: Secondary | ICD-10-CM | POA: Diagnosis not present

## 2017-08-25 DIAGNOSIS — G5601 Carpal tunnel syndrome, right upper limb: Secondary | ICD-10-CM | POA: Diagnosis not present

## 2017-08-26 DIAGNOSIS — M5412 Radiculopathy, cervical region: Secondary | ICD-10-CM | POA: Diagnosis not present

## 2017-08-26 DIAGNOSIS — G5601 Carpal tunnel syndrome, right upper limb: Secondary | ICD-10-CM | POA: Diagnosis not present

## 2017-08-26 DIAGNOSIS — G8929 Other chronic pain: Secondary | ICD-10-CM | POA: Diagnosis not present

## 2017-08-27 DIAGNOSIS — G5601 Carpal tunnel syndrome, right upper limb: Secondary | ICD-10-CM | POA: Diagnosis not present

## 2017-08-27 DIAGNOSIS — M5412 Radiculopathy, cervical region: Secondary | ICD-10-CM | POA: Diagnosis not present

## 2017-11-07 DIAGNOSIS — R569 Unspecified convulsions: Secondary | ICD-10-CM

## 2017-11-07 HISTORY — DX: Unspecified convulsions: R56.9

## 2017-12-06 ENCOUNTER — Emergency Department (HOSPITAL_COMMUNITY): Payer: BLUE CROSS/BLUE SHIELD

## 2017-12-06 ENCOUNTER — Inpatient Hospital Stay (HOSPITAL_COMMUNITY)
Admission: EM | Admit: 2017-12-06 | Discharge: 2017-12-12 | DRG: 023 | Disposition: A | Discharge status: Home Health | Payer: BLUE CROSS/BLUE SHIELD | Attending: Neurology | Admitting: Neurology

## 2017-12-06 ENCOUNTER — Encounter (HOSPITAL_COMMUNITY)
Admission: EM | Disposition: A | Discharge status: Home Health | Payer: Self-pay | Source: Home / Self Care | Attending: Neurology

## 2017-12-06 ENCOUNTER — Emergency Department (HOSPITAL_COMMUNITY): Payer: BLUE CROSS/BLUE SHIELD | Admitting: Certified Registered"

## 2017-12-06 ENCOUNTER — Encounter (HOSPITAL_COMMUNITY): Payer: Self-pay | Admitting: Radiology

## 2017-12-06 ENCOUNTER — Inpatient Hospital Stay (HOSPITAL_COMMUNITY): Payer: BLUE CROSS/BLUE SHIELD

## 2017-12-06 DIAGNOSIS — J449 Chronic obstructive pulmonary disease, unspecified: Secondary | ICD-10-CM | POA: Diagnosis present

## 2017-12-06 DIAGNOSIS — E785 Hyperlipidemia, unspecified: Secondary | ICD-10-CM | POA: Diagnosis not present

## 2017-12-06 DIAGNOSIS — D72829 Elevated white blood cell count, unspecified: Secondary | ICD-10-CM | POA: Diagnosis not present

## 2017-12-06 DIAGNOSIS — Z833 Family history of diabetes mellitus: Secondary | ICD-10-CM

## 2017-12-06 DIAGNOSIS — G250 Essential tremor: Secondary | ICD-10-CM | POA: Diagnosis present

## 2017-12-06 DIAGNOSIS — R4701 Aphasia: Secondary | ICD-10-CM | POA: Diagnosis present

## 2017-12-06 DIAGNOSIS — I2510 Atherosclerotic heart disease of native coronary artery without angina pectoris: Secondary | ICD-10-CM | POA: Diagnosis not present

## 2017-12-06 DIAGNOSIS — K760 Fatty (change of) liver, not elsewhere classified: Secondary | ICD-10-CM | POA: Diagnosis not present

## 2017-12-06 DIAGNOSIS — I959 Hypotension, unspecified: Secondary | ICD-10-CM | POA: Diagnosis not present

## 2017-12-06 DIAGNOSIS — I63412 Cerebral infarction due to embolism of left middle cerebral artery: Principal | ICD-10-CM | POA: Diagnosis present

## 2017-12-06 DIAGNOSIS — R4189 Other symptoms and signs involving cognitive functions and awareness: Secondary | ICD-10-CM

## 2017-12-06 DIAGNOSIS — Z23 Encounter for immunization: Secondary | ICD-10-CM

## 2017-12-06 DIAGNOSIS — I679 Cerebrovascular disease, unspecified: Secondary | ICD-10-CM | POA: Diagnosis present

## 2017-12-06 DIAGNOSIS — I10 Essential (primary) hypertension: Secondary | ICD-10-CM | POA: Diagnosis present

## 2017-12-06 DIAGNOSIS — I998 Other disorder of circulatory system: Secondary | ICD-10-CM

## 2017-12-06 DIAGNOSIS — G8191 Hemiplegia, unspecified affecting right dominant side: Secondary | ICD-10-CM | POA: Diagnosis not present

## 2017-12-06 DIAGNOSIS — I639 Cerebral infarction, unspecified: Secondary | ICD-10-CM

## 2017-12-06 DIAGNOSIS — Z87891 Personal history of nicotine dependence: Secondary | ICD-10-CM | POA: Diagnosis present

## 2017-12-06 DIAGNOSIS — E119 Type 2 diabetes mellitus without complications: Secondary | ICD-10-CM | POA: Diagnosis not present

## 2017-12-06 DIAGNOSIS — K219 Gastro-esophageal reflux disease without esophagitis: Secondary | ICD-10-CM | POA: Diagnosis not present

## 2017-12-06 DIAGNOSIS — G4733 Obstructive sleep apnea (adult) (pediatric): Secondary | ICD-10-CM | POA: Diagnosis not present

## 2017-12-06 DIAGNOSIS — Y92239 Unspecified place in hospital as the place of occurrence of the external cause: Secondary | ICD-10-CM | POA: Diagnosis not present

## 2017-12-06 DIAGNOSIS — Z825 Family history of asthma and other chronic lower respiratory diseases: Secondary | ICD-10-CM

## 2017-12-06 DIAGNOSIS — T85868A Thrombosis due to other internal prosthetic devices, implants and grafts, initial encounter: Secondary | ICD-10-CM | POA: Diagnosis not present

## 2017-12-06 DIAGNOSIS — Z8673 Personal history of transient ischemic attack (TIA), and cerebral infarction without residual deficits: Secondary | ICD-10-CM | POA: Diagnosis not present

## 2017-12-06 DIAGNOSIS — Z7982 Long term (current) use of aspirin: Secondary | ICD-10-CM | POA: Diagnosis not present

## 2017-12-06 DIAGNOSIS — E876 Hypokalemia: Secondary | ICD-10-CM | POA: Diagnosis not present

## 2017-12-06 DIAGNOSIS — I69315 Cognitive social or emotional deficit following cerebral infarction: Secondary | ICD-10-CM | POA: Diagnosis not present

## 2017-12-06 DIAGNOSIS — D649 Anemia, unspecified: Secondary | ICD-10-CM | POA: Diagnosis present

## 2017-12-06 DIAGNOSIS — I69398 Other sequelae of cerebral infarction: Secondary | ICD-10-CM | POA: Diagnosis not present

## 2017-12-06 DIAGNOSIS — R4702 Dysphasia: Secondary | ICD-10-CM | POA: Diagnosis not present

## 2017-12-06 DIAGNOSIS — F17210 Nicotine dependence, cigarettes, uncomplicated: Secondary | ICD-10-CM | POA: Diagnosis not present

## 2017-12-06 DIAGNOSIS — E1165 Type 2 diabetes mellitus with hyperglycemia: Secondary | ICD-10-CM | POA: Diagnosis not present

## 2017-12-06 DIAGNOSIS — R29710 NIHSS score 10: Secondary | ICD-10-CM | POA: Diagnosis present

## 2017-12-06 DIAGNOSIS — Z820 Family history of epilepsy and other diseases of the nervous system: Secondary | ICD-10-CM

## 2017-12-06 DIAGNOSIS — I6602 Occlusion and stenosis of left middle cerebral artery: Secondary | ICD-10-CM | POA: Diagnosis not present

## 2017-12-06 DIAGNOSIS — R571 Hypovolemic shock: Secondary | ICD-10-CM | POA: Diagnosis not present

## 2017-12-06 DIAGNOSIS — Y848 Other medical procedures as the cause of abnormal reaction of the patient, or of later complication, without mention of misadventure at the time of the procedure: Secondary | ICD-10-CM | POA: Diagnosis not present

## 2017-12-06 DIAGNOSIS — N200 Calculus of kidney: Secondary | ICD-10-CM | POA: Diagnosis not present

## 2017-12-06 DIAGNOSIS — G3184 Mild cognitive impairment, so stated: Secondary | ICD-10-CM | POA: Diagnosis present

## 2017-12-06 DIAGNOSIS — I609 Nontraumatic subarachnoid hemorrhage, unspecified: Secondary | ICD-10-CM | POA: Diagnosis not present

## 2017-12-06 DIAGNOSIS — I6789 Other cerebrovascular disease: Secondary | ICD-10-CM | POA: Diagnosis not present

## 2017-12-06 DIAGNOSIS — Z7984 Long term (current) use of oral hypoglycemic drugs: Secondary | ICD-10-CM

## 2017-12-06 DIAGNOSIS — M797 Fibromyalgia: Secondary | ICD-10-CM | POA: Diagnosis present

## 2017-12-06 DIAGNOSIS — E7800 Pure hypercholesterolemia, unspecified: Secondary | ICD-10-CM | POA: Diagnosis present

## 2017-12-06 DIAGNOSIS — E1151 Type 2 diabetes mellitus with diabetic peripheral angiopathy without gangrene: Secondary | ICD-10-CM | POA: Diagnosis not present

## 2017-12-06 DIAGNOSIS — I6389 Other cerebral infarction: Secondary | ICD-10-CM | POA: Diagnosis not present

## 2017-12-06 DIAGNOSIS — R269 Unspecified abnormalities of gait and mobility: Secondary | ICD-10-CM | POA: Diagnosis not present

## 2017-12-06 DIAGNOSIS — Z8249 Family history of ischemic heart disease and other diseases of the circulatory system: Secondary | ICD-10-CM

## 2017-12-06 HISTORY — PX: IR PERCUTANEOUS ART THROMBECTOMY/INFUSION INTRACRANIAL INC DIAG ANGIO: IMG6087

## 2017-12-06 HISTORY — PX: RADIOLOGY WITH ANESTHESIA: SHX6223

## 2017-12-06 HISTORY — PX: IR CT HEAD LTD: IMG2386

## 2017-12-06 HISTORY — DX: Cerebral infarction, unspecified: I63.9

## 2017-12-06 LAB — DIFFERENTIAL
BASOS ABS: 0 10*3/uL (ref 0.0–0.1)
Basophils Relative: 0 %
Eosinophils Absolute: 0.3 10*3/uL (ref 0.0–0.7)
Eosinophils Relative: 4 %
LYMPHS PCT: 33 %
Lymphs Abs: 2.9 10*3/uL (ref 0.7–4.0)
MONO ABS: 0.4 10*3/uL (ref 0.1–1.0)
Monocytes Relative: 4 %
NEUTROS ABS: 5.4 10*3/uL (ref 1.7–7.7)
Neutrophils Relative %: 59 %

## 2017-12-06 LAB — CBC
HEMATOCRIT: 44 % (ref 36.0–46.0)
Hemoglobin: 15.4 g/dL — ABNORMAL HIGH (ref 12.0–15.0)
MCH: 30.9 pg (ref 26.0–34.0)
MCHC: 35 g/dL (ref 30.0–36.0)
MCV: 88.2 fL (ref 78.0–100.0)
Platelets: 243 10*3/uL (ref 150–400)
RBC: 4.99 MIL/uL (ref 3.87–5.11)
RDW: 11.9 % (ref 11.5–15.5)
WBC: 9 10*3/uL (ref 4.0–10.5)

## 2017-12-06 LAB — URINALYSIS, ROUTINE W REFLEX MICROSCOPIC
Bacteria, UA: NONE SEEN
Bilirubin Urine: NEGATIVE
HGB URINE DIPSTICK: NEGATIVE
Ketones, ur: 20 mg/dL — AB
Leukocytes, UA: NEGATIVE
Nitrite: NEGATIVE
PH: 5 (ref 5.0–8.0)
PROTEIN: NEGATIVE mg/dL
SPECIFIC GRAVITY, URINE: 1.037 — AB (ref 1.005–1.030)
Squamous Epithelial / LPF: NONE SEEN
WBC, UA: NONE SEEN WBC/hpf (ref 0–5)

## 2017-12-06 LAB — COMPREHENSIVE METABOLIC PANEL
ALK PHOS: 154 U/L — AB (ref 38–126)
ALT: 17 U/L (ref 14–54)
AST: 19 U/L (ref 15–41)
Albumin: 3.7 g/dL (ref 3.5–5.0)
Anion gap: 12 (ref 5–15)
BUN: 10 mg/dL (ref 6–20)
CHLORIDE: 102 mmol/L (ref 101–111)
CO2: 19 mmol/L — AB (ref 22–32)
CREATININE: 0.6 mg/dL (ref 0.44–1.00)
Calcium: 9.2 mg/dL (ref 8.9–10.3)
GFR calc non Af Amer: >60 mL/min (ref >60)
GLUCOSE: 410 mg/dL — AB (ref 65–99)
Potassium: 3.9 mmol/L (ref 3.5–5.1)
SODIUM: 133 mmol/L — AB (ref 135–145)
Total Bilirubin: 0.5 mg/dL (ref 0.3–1.2)
Total Protein: 6.8 g/dL (ref 6.5–8.1)

## 2017-12-06 LAB — I-STAT CHEM 8, ED
BUN: 14 mg/dL (ref 6–20)
CALCIUM ION: 1.12 mmol/L — AB (ref 1.15–1.40)
CHLORIDE: 103 mmol/L (ref 101–111)
Creatinine, Ser: 0.4 mg/dL — ABNORMAL LOW (ref 0.44–1.00)
GLUCOSE: 420 mg/dL — AB (ref 65–99)
HCT: 44 % (ref 36.0–46.0)
Hemoglobin: 15 g/dL (ref 12.0–15.0)
Potassium: 4 mmol/L (ref 3.5–5.1)
Sodium: 136 mmol/L (ref 135–145)
TCO2: 21 mmol/L — ABNORMAL LOW (ref 22–32)

## 2017-12-06 LAB — RAPID URINE DRUG SCREEN, HOSP PERFORMED
AMPHETAMINES: NOT DETECTED
BARBITURATES: NOT DETECTED
BENZODIAZEPINES: NOT DETECTED
Cocaine: NOT DETECTED
Opiates: NOT DETECTED
TETRAHYDROCANNABINOL: NOT DETECTED

## 2017-12-06 LAB — PROTIME-INR
INR: 0.9
Prothrombin Time: 12.1 seconds (ref 11.4–15.2)

## 2017-12-06 LAB — I-STAT BETA HCG BLOOD, ED (MC, WL, AP ONLY): I-stat hCG, quantitative: 12.5 m[IU]/mL — ABNORMAL HIGH (ref <5)

## 2017-12-06 LAB — APTT: APTT: 25 s (ref 24–36)

## 2017-12-06 LAB — I-STAT TROPONIN, ED: Troponin i, poc: 0 ng/mL (ref 0.00–0.08)

## 2017-12-06 LAB — GLUCOSE, CAPILLARY: GLUCOSE-CAPILLARY: 250 mg/dL — AB (ref 65–99)

## 2017-12-06 LAB — ETHANOL: Alcohol, Ethyl (B): <10 mg/dL (ref <10)

## 2017-12-06 LAB — MRSA PCR SCREENING: MRSA BY PCR: NEGATIVE

## 2017-12-06 LAB — CBG MONITORING, ED: Glucose-Capillary: 389 mg/dL — ABNORMAL HIGH (ref 65–99)

## 2017-12-06 SURGERY — IR WITH ANESTHESIA
Anesthesia: General

## 2017-12-06 MED ORDER — TICAGRELOR 90 MG PO TABS
ORAL_TABLET | ORAL | Status: AC
  Filled 2017-12-06: qty 2

## 2017-12-06 MED ORDER — ACETAMINOPHEN 650 MG RE SUPP: 650.0000 mg | RECTAL | Status: DC | PRN

## 2017-12-06 MED ORDER — LACTATED RINGERS IV SOLN: INTRAVENOUS | Status: DC

## 2017-12-06 MED ORDER — ALTEPLASE 30 MG/30 ML FOR INTERV. RAD
1.0000 mg | INTRA_ARTERIAL | Status: AC | PRN
  Filled 2017-12-06: qty 30

## 2017-12-06 MED ORDER — STROKE: EARLY STAGES OF RECOVERY BOOK
Freq: Once | Status: DC
  Filled 2017-12-06: qty 1

## 2017-12-06 MED ORDER — PROPOFOL 10 MG/ML IV BOLUS
INTRAVENOUS | Status: DC | PRN
  Administered 2017-12-06: 60 mg via INTRAVENOUS
  Administered 2017-12-06: 120 mg via INTRAVENOUS
  Administered 2017-12-06: 50 mg via INTRAVENOUS

## 2017-12-06 MED ORDER — IOPAMIDOL (ISOVUE-370) INJECTION 76%
50.0000 mL | Freq: Once | INTRAVENOUS | Status: AC | PRN
  Administered 2017-12-06: 50 mL via INTRAVENOUS

## 2017-12-06 MED ORDER — LIDOCAINE HCL 1 % IJ SOLN
INTRAMUSCULAR | Status: AC
  Filled 2017-12-06: qty 20

## 2017-12-06 MED ORDER — CEFAZOLIN SODIUM-DEXTROSE 2-4 GM/100ML-% IV SOLN
INTRAVENOUS | Status: AC
Start: 2017-12-06 — End: 2017-12-07
  Filled 2017-12-06: qty 100

## 2017-12-06 MED ORDER — SUGAMMADEX SODIUM 200 MG/2ML IV SOLN
INTRAVENOUS | Status: DC | PRN
  Administered 2017-12-06: 120 mg via INTRAVENOUS

## 2017-12-06 MED ORDER — IOPAMIDOL (ISOVUE-300) INJECTION 61%
INTRAVENOUS | Status: AC
  Administered 2017-12-06: 60 mL via INTRA_ARTERIAL
  Filled 2017-12-06: qty 300

## 2017-12-06 MED ORDER — FENTANYL CITRATE (PF) 100 MCG/2ML IJ SOLN
25.0000 ug | INTRAMUSCULAR | Status: DC | PRN
  Administered 2017-12-06: 25 ug via INTRAVENOUS
  Administered 2017-12-07: 50 ug via INTRAVENOUS
  Filled 2017-12-06 (×2): qty 2

## 2017-12-06 MED ORDER — NICARDIPINE HCL IN NACL 20-0.86 MG/200ML-% IV SOLN
INTRAVENOUS | Status: DC | PRN
  Administered 2017-12-06: 2.5 mg/h via INTRAVENOUS

## 2017-12-06 MED ORDER — NITROGLYCERIN 1 MG/10 ML FOR IR/CATH LAB
INTRA_ARTERIAL | Status: AC
  Administered 2017-12-06 (×2): 25 ug via INTRA_ARTERIAL
  Administered 2017-12-06: 25 ug
  Filled 2017-12-06: qty 10

## 2017-12-06 MED ORDER — ACETAMINOPHEN 160 MG/5ML PO SOLN: 650.0000 mg | ORAL | Status: DC | PRN

## 2017-12-06 MED ORDER — EPTIFIBATIDE 20 MG/10ML IV SOLN
INTRAVENOUS | Status: AC
  Administered 2017-12-06: 1.5 mg
  Administered 2017-12-06: 3 mg
  Administered 2017-12-06: 1.5 ug via INTRA_ARTERIAL
  Filled 2017-12-06: qty 10

## 2017-12-06 MED ORDER — CEFAZOLIN SODIUM-DEXTROSE 2-3 GM-%(50ML) IV SOLR
INTRAVENOUS | Status: DC | PRN
  Administered 2017-12-06: 2 g via INTRAVENOUS

## 2017-12-06 MED ORDER — MEPERIDINE HCL 25 MG/ML IJ SOLN
6.2500 mg | INTRAMUSCULAR | Status: DC | PRN
  Filled 2017-12-06: qty 1

## 2017-12-06 MED ORDER — FENTANYL CITRATE (PF) 100 MCG/2ML IJ SOLN
INTRAMUSCULAR | Status: AC
  Filled 2017-12-06: qty 2

## 2017-12-06 MED ORDER — ALTEPLASE (STROKE) FULL DOSE INFUSION
0.9000 mg/kg | Freq: Once | INTRAVENOUS | Status: AC
  Administered 2017-12-06: 54.8 mg via INTRAVENOUS
  Filled 2017-12-06: qty 100

## 2017-12-06 MED ORDER — IOPAMIDOL (ISOVUE-370) INJECTION 76%
INTRAVENOUS | Status: AC
  Administered 2017-12-06: 60 mL via INTRA_ARTERIAL
  Filled 2017-12-06: qty 50

## 2017-12-06 MED ORDER — NITROGLYCERIN 1 MG/10 ML FOR IR/CATH LAB
INTRA_ARTERIAL | Status: AC
  Administered 2017-12-06: 25 ug
  Administered 2017-12-06: 50 ug
  Administered 2017-12-06: 25 ug via INTRA_ARTERIAL
  Administered 2017-12-06: 25 ug
  Filled 2017-12-06: qty 10

## 2017-12-06 MED ORDER — ASPIRIN 325 MG PO TABS
ORAL_TABLET | ORAL | Status: AC
  Filled 2017-12-06: qty 1

## 2017-12-06 MED ORDER — INSULIN ASPART 100 UNIT/ML ~~LOC~~ SOLN
1.0000 [IU] | SUBCUTANEOUS | Status: DC
  Administered 2017-12-06: 3 [IU] via SUBCUTANEOUS

## 2017-12-06 MED ORDER — PHENYLEPHRINE HCL 10 MG/ML IJ SOLN
INTRAMUSCULAR | Status: DC | PRN
  Administered 2017-12-06 (×2): 20 ug via INTRAVENOUS

## 2017-12-06 MED ORDER — NICARDIPINE HCL IN NACL 20-0.86 MG/200ML-% IV SOLN
0.0000 mg/h | INTRAVENOUS | Status: DC
  Administered 2017-12-07: 7.5 mg/h via INTRAVENOUS
  Administered 2017-12-07 (×3): 15 mg/h via INTRAVENOUS
  Administered 2017-12-07: 10 mg/h via INTRAVENOUS
  Administered 2017-12-07: 7.5 mg/h via INTRAVENOUS
  Administered 2017-12-07: 10 mg/h via INTRAVENOUS
  Administered 2017-12-07: 15 mg/h via INTRAVENOUS
  Administered 2017-12-07: 7.5 mg/h via INTRAVENOUS
  Administered 2017-12-07: 15 mg/h via INTRAVENOUS
  Administered 2017-12-07: 12.5 mg/h via INTRAVENOUS
  Filled 2017-12-06: qty 200
  Filled 2017-12-06 (×2): qty 400
  Filled 2017-12-06 (×3): qty 200
  Filled 2017-12-06 (×2): qty 400
  Filled 2017-12-06: qty 200

## 2017-12-06 MED ORDER — CLOPIDOGREL BISULFATE 300 MG PO TABS
ORAL_TABLET | ORAL | Status: AC
  Filled 2017-12-06: qty 1

## 2017-12-06 MED ORDER — ORAL CARE MOUTH RINSE
15.0000 mL | Freq: Two times a day (BID) | OROMUCOSAL | Status: DC
  Administered 2017-12-06 – 2017-12-12 (×11): 15 mL via OROMUCOSAL

## 2017-12-06 MED ORDER — SODIUM CHLORIDE 0.9 % IV SOLN
INTRAVENOUS | Status: DC
  Administered 2017-12-06 (×2): via INTRAVENOUS

## 2017-12-06 MED ORDER — SUCCINYLCHOLINE CHLORIDE 20 MG/ML IJ SOLN
INTRAMUSCULAR | Status: DC | PRN
  Administered 2017-12-06: 100 mg via INTRAVENOUS

## 2017-12-06 MED ORDER — ONDANSETRON HCL 4 MG/2ML IJ SOLN
INTRAMUSCULAR | Status: DC | PRN
  Administered 2017-12-06: 4 mg via INTRAVENOUS

## 2017-12-06 MED ORDER — SODIUM CHLORIDE 0.9 % IV SOLN
INTRAVENOUS | Status: DC
  Administered 2017-12-07: 23:00:00 via INTRAVENOUS
  Administered 2017-12-07: 75 mL/h via INTRAVENOUS
  Administered 2017-12-08: 20:00:00 via INTRAVENOUS

## 2017-12-06 MED ORDER — ROCURONIUM BROMIDE 100 MG/10ML IV SOLN
INTRAVENOUS | Status: DC | PRN
  Administered 2017-12-06: 40 mg via INTRAVENOUS
  Administered 2017-12-06: 20 mg via INTRAVENOUS

## 2017-12-06 MED ORDER — PANTOPRAZOLE SODIUM 40 MG IV SOLR
40.0000 mg | Freq: Every day | INTRAVENOUS | Status: DC
  Administered 2017-12-06 – 2017-12-07 (×2): 40 mg via INTRAVENOUS
  Filled 2017-12-06 (×2): qty 40

## 2017-12-06 MED ORDER — PROMETHAZINE HCL 25 MG/ML IJ SOLN: 6.2500 mg | INTRAMUSCULAR | Status: DC | PRN

## 2017-12-06 MED ORDER — FENTANYL CITRATE (PF) 100 MCG/2ML IJ SOLN
INTRAMUSCULAR | Status: DC | PRN
  Administered 2017-12-06: 50 ug via INTRAVENOUS
  Administered 2017-12-06: 25 ug via INTRAVENOUS

## 2017-12-06 MED ORDER — ACETAMINOPHEN 325 MG PO TABS: 650.0000 mg | ORAL_TABLET | ORAL | Status: DC | PRN

## 2017-12-06 MED ORDER — TIROFIBAN HCL IN NACL 5-0.9 MG/100ML-% IV SOLN
INTRAVENOUS | Status: AC
  Filled 2017-12-06: qty 100

## 2017-12-06 MED ORDER — LIDOCAINE HCL (CARDIAC) 20 MG/ML IV SOLN
INTRAVENOUS | Status: DC | PRN
  Administered 2017-12-06: 60 mg via INTRAVENOUS

## 2017-12-06 NOTE — Progress Notes (Signed)
Pt crying out in pain, pt states "belly hurts". Groin site appears stable and no changes in neuro exam. 25 mcg fent given Paged Deveshwar

## 2017-12-06 NOTE — ED Triage Notes (Signed)
Pt arrives via EMS as a code stroke from home. Pt arrives with expressive aphasia, unable to follow commands. VSS with EMS.

## 2017-12-06 NOTE — Anesthesia Postprocedure Evaluation (Signed)
Anesthesia Post Note  Patient: Brittany [name]  Procedure(s) Performed: CODE STROKE (N/A )     Patient location during evaluation: PACU Anesthesia Type: General Level of consciousness: awake and alert Pain management: pain level controlled Vital Signs Assessment: post-procedure vital signs reviewed and stable Respiratory status: spontaneous breathing, nonlabored ventilation, respiratory function stable and patient connected to nasal cannula oxygen Cardiovascular status: blood pressure returned to baseline and stable Postop Assessment: no apparent nausea or vomiting Anesthetic complications: no    Last Vitals:  Vitals:   12/06/17 2245 12/06/17 2300  BP: 111/60 126/62  Pulse: 76 92  Resp: (!) 21 13  Temp:    SpO2: 100% 100%    Last Pain:  Vitals:   12/06/17 1937  TempSrc: Oral    LLE Motor Response: Purposeful movement (12/06/17 2300) LLE Sensation: Other (Comment) (12/06/17 2300) RLE Motor Response: Purposeful movement (12/06/17 2300) RLE Sensation: Other (Comment) (12/06/17 2300)      Shelton SilvasKevin D Sudiksha Victor

## 2017-12-06 NOTE — Transfer of Care (Signed)
Immediate Anesthesia Transfer of Care Note  Patient: Jenne CampusDenise W Mefferd  Procedure(s) Performed: CODE STROKE (N/A )  Patient Location: PACU  Anesthesia Type:General  Level of Consciousness: responds to stimulation  Airway & Oxygen Therapy: Patient Spontanous Breathing and Patient connected to face mask oxygen  Post-op Assessment: Report given to RN, Post -op Vital signs reviewed and stable and Patient moving all extremities X 4  Post vital signs: Reviewed and stable  Last Vitals:  Vitals Value Taken Time  BP    Temp    Pulse    Resp    SpO2      Last Pain:  Vitals:   12/06/17 1354  TempSrc: Oral         Complications: No apparent anesthesia complications.  Patient will open eyes to name, nod head to voice.  Will not follow direct commands such as "Squeeze my hand". Breathing well and protecting airway.  Sats 100%.

## 2017-12-06 NOTE — Procedures (Addendum)
S/P  Lt common carotid arteriogram,followe by near complete revascularization of occluded distal M2 branch of Lt MCA with x1 pass with solitaire Fr724mm x 40 mmretriver  And 4.5 mg of IA integrelin Achieving a TICI 2b reperfusion.

## 2017-12-06 NOTE — Anesthesia Preprocedure Evaluation (Addendum)
Anesthesia Evaluation  Patient identified by MRN, date of birth, ID band Patient confused    Reviewed: Allergy & Precautions, NPO status , Patient's Chart, lab work & pertinent test results  Airway Mallampati: II  TM Distance: >3 FB Neck ROM: Full    Dental  (+) Teeth Intact, Dental Advisory Given   Pulmonary sleep apnea , COPD,  COPD inhaler, Current Smoker,    breath sounds clear to auscultation       Cardiovascular hypertension, Pt. on medications  Rhythm:Regular Rate:Normal     Neuro/Psych PSYCHIATRIC DISORDERS Depression  Neuromuscular disease    GI/Hepatic Neg liver ROS, GERD  ,  Endo/Other  diabetes, Type 2, Oral Hypoglycemic Agents  Renal/GU negative Renal ROS     Musculoskeletal  (+) Fibromyalgia -  Abdominal Normal abdominal exam  (+)   Peds  Hematology negative hematology ROS (+)   Anesthesia Other Findings   Reproductive/Obstetrics                             Anesthesia Physical Anesthesia Plan  ASA: III and emergent  Anesthesia Plan: General   Post-op Pain Management:    Induction: Intravenous, Rapid sequence and Cricoid pressure planned  PONV Risk Score and Plan: 3 and Ondansetron and Treatment may vary due to age or medical condition  Airway Management Planned: Oral ETT  Additional Equipment: Arterial line  Intra-op Plan:   Post-operative Plan: Extubation in OR  Informed Consent: I have reviewed the patients History and Physical, chart, labs and discussed the procedure including the risks, benefits and alternatives for the proposed anesthesia with the patient or authorized representative who has indicated his/her understanding and acceptance.   Dental advisory given  Plan Discussed with: CRNA  Anesthesia Plan Comments: (Per Dr. Corliss Skainseveshwar, pt husband states Ms. Ivey is not pregnant is post-menopausal. )       Anesthesia Quick Evaluation

## 2017-12-06 NOTE — ED Notes (Signed)
Brittany [name] placed family in a consultation room

## 2017-12-06 NOTE — Progress Notes (Addendum)
Called for acute abdominal pain and new onset of pallor and decreased temperature of left foot with absent dorsalis pedis pulse to palpation and ultrasound.   Exam: BP 126/62   Pulse 92   Temp 98.1 F (36.7 C) (Oral)   Resp 13   Wt 60.9 kg (134 lb 4.2 oz)   SpO2 100%   BMI 23.78 kg/m   General: Respirations unlabored. Abdomen with guarding to palpation all 4 quadrants. Left foot pale, cool to touch with no dorsalis pedis pulse to palpation or with ultrasound. Right groin with sheath out, dressing on, no hematoma palpated. No palpable cord in RLE.   Neurological exam: Ment: Receptive aphasia with word salad and perseveration regarding pain. Unable to state where pain is coming from.  CN: Fixates normally on examiner. Face symmetric Motor: LUE and LLE normal strength. RUE 4/5. Not moving RLE to command or noxious stimuli.  A/R: 57 year old female presenting with acute stroke. S/P IV tPA and thrombectomy. Now with acute pain of unknown location, in the context of possible acute ischemic LLE.  1. DDx for acute limb ischemia is pelvic hematoma compressing vascular supply to leg, versus in situ arterial thrombosis at groin puncture site.  2. STAT vascular surgery consult is being called. I have spoken with the surgeon and he will assess the patient acutely.  3. If vascular procedure is necessary, risk/benefit profile may favor IV heparin sooner than the general limit within 24 hours following tPA. Combined morbidity/risk of loss of RLE to ischemia most likely outweighs risk/morbidity from possible hemorrhagic transformation of cerebral infarction. The risk of hemorrhagic transformation has most likely dropped significantly as 7 hours have elapsed since the time of cerebral endovascular procedure and IV tPA. Although general guidelines are to not admister anticoagulation or antiplatelet agents for 24 hours following tPA, the half life of tPA is short (5-10 minutes) and effects per the literature  dissipate relatively rapidly after initial administration (duration of action approximately 15 minutes).   40 minutes spent in the acute management of this critically ill patient, including coordination of care with vascular surgery team.  Electronically signed: Dr. Caryl PinaEric Nijah Tejera

## 2017-12-06 NOTE — ED Provider Notes (Signed)
[facility] EMERGENCY DEPARTMENT Provider Note   CSN: [id number] Arrival date & time:        History   Chief Complaint chief complaint altered mental status No chief complaint on file. Level 5 caveat acuity of situation history is obtained from patient's son and from EMS  HPI Brittany [name] is a 57 y.o. female.  HPI She was last normal at 9 AM today according to patient's son.  She developed acute confusion.  EMS was called.  EMS obtained CBG which was 510.  No treatment prior to coming here.  Code stroke was called in the field. Past Medical History:  Diagnosis Date  . Depression   . Diabetes mellitus without complication (HCC)   . Fibromyalgia   . High cholesterol     Patient Active Problem List   Diagnosis Date Noted  . Poor compliance with CPAP treatment 03/04/2017  . OSA and COPD overlap syndrome (HCC) 03/04/2017  . Simple chronic bronchitis (HCC) 03/04/2017  . MCI (mild cognitive impairment) 11/27/2016  . Left sided lacunar infarction 03/11/2014  . Cerebrovascular small vessel disease 03/11/2014  . Insomnia, idiopathic 12/20/2013  . Snoring 12/06/2013  . Tobacco use disorder 01/22/2013  . Essential hypertension, benign 10/26/2012  . Hyperlipidemia LDL goal <70 10/26/2012  . Polypharmacy 10/26/2012  . DM (diabetes mellitus) (HCC) 10/13/2011  . Tremor 10/13/2011  . GERD (gastroesophageal reflux disease) 10/13/2011  . Depression 10/13/2011    Past Surgical History:  Procedure Laterality Date  . CHOLECYSTECTOMY    . SPINE SURGERY    . TONSILLECTOMY       OB History   None      Home Medications    Prior to Admission medications   Medication Sig Start Date End Date Taking? Authorizing Provider  albuterol (PROVENTIL) (2.5 MG/3ML) 0.083% nebulizer solution Take 3 mLs (2.5 mg total) by nebulization every 6 (six) hours as needed for wheezing or shortness of breath. 07/13/16   [name], MD  Albuterol Sulfate (PROAIR RESPICLICK) 108 (90  Base) MCG/ACT AEPB Inhale 2 puffs into the lungs every 4 (four) hours as needed. 07/13/16   [name], MD  aspirin 325 MG tablet Take 325 mg by mouth daily.    [provider]  atorvastatin (LIPITOR) 40 MG tablet Take 1 tablet (40 mg total) by mouth daily. 05/17/17   [name], MD  B Complex-Biotin-FA (MULTI-B COMPLEX) CAPS Take one a day ,B complex  multivitamin over the counter or use prenatal vitamin. 08/28/16   Dohmeier, Porfirio Mylar, MD  BD PEN NEEDLE NANO U/F 32G X 4 MM MISC USE WITH VICTOZA PEN 05/23/17   [name], MD  Canagliflozin-Metformin HCl ER (INVOKAMET XR) 50-1000 MG TB24 Take 2 tablets by mouth daily. 05/17/17   [name], MD  cholecalciferol (VITAMIN D) 1000 UNITS tablet Take 1,000 Units by mouth daily.    [provider]  estradiol (ESTRACE) 0.1 MG/GM vaginal cream Apply to perineum at night x 2 weeks, then every other night. 07/30/17   [name], MD  liraglutide (VICTOZA) 18 MG/3ML SOPN Inject 0.3 mLs (1.8 mg total) into the skin daily. 05/17/17   [name], MD  losartan (COZAAR) 50 MG tablet Take 1 tablet (50 mg total) by mouth daily. 05/17/17   [name], MD  metFORMIN (GLUCOPHAGE) 500 MG tablet Take by mouth 4 (four) times daily.    [provider]  primidone (MYSOLINE) 250 MG tablet Take 1 tablet (250  mg total) by mouth at bedtime. 08/28/16   Dohmeier, Porfirio Mylar, MD  sertraline (ZOLOFT) 100 MG tablet Take 2 tablets (200 mg total) by mouth at bedtime. 05/17/17   [name], MD  sitaGLIPtin (JANUVIA) 100 MG tablet Take 1 tablet (100 mg total) by mouth daily. 05/17/17   [name], MD    Family History Family History  Problem Relation Age of Onset  . Cancer Mother   . Diabetes Mother   . Heart disease Mother   . Parkinsonism Mother   . Diabetes Father   . Heart disease Father   . Dementia Father   . Cancer Maternal Grandmother 62       lung  . COPD Maternal Aunt     Social History Social History   Tobacco Use  . Smoking status: Current Every  Day Smoker    Packs/day: 1.00    Years: 2.00    Pack years: 2.00    Types: Cigarettes  . Smokeless tobacco: Never Used  Substance Use Topics  . Alcohol use: Yes    Comment: rarely  . Drug use: No     Allergies   Patient has no known allergies.   Review of Systems Review of Systems  Unable to perform ROS: Mental status change  Allergic/Immunologic: Positive for immunocompromised state.       Diabetic     Physical Exam Updated Vital Signs Wt 60.9 kg (134 lb 4.2 oz)   BMI 23.78 kg/m   Physical Exam  Constitutional: She appears well-developed and well-nourished. She appears distressed.  HENT:  Head: Normocephalic and atraumatic.  No facial asymmetry  Eyes: Pupils are equal, round, and reactive to light. Conjunctivae are normal.  Neck: Neck supple. No tracheal deviation present. No thyromegaly present.  No bruit  Cardiovascular: Normal rate and regular rhythm.  No murmur heard. Pulmonary/Chest: Effort normal and breath sounds normal.  Abdominal: Soft. Bowel sounds are normal. She exhibits no distension. There is no tenderness.  Musculoskeletal: Normal range of motion. She exhibits no edema or tenderness.  Moves all extremities.  Expressive aphasia.  Cranial nerves II through XII grossly intact  Neurological: She is alert. Coordination normal.  Skin: Skin is warm and dry. No rash noted.  Psychiatric: She has a normal mood and affect.  Nursing note and vitals reviewed.    ED Treatments / Results  Labs (all labs ordered are listed, but only abnormal results are displayed) Labs Reviewed  CBG MONITORING, ED - Abnormal; Notable for the following components:      Result Value   Glucose-Capillary 389 (*)    All other components within normal limits  ETHANOL  PROTIME-INR  APTT  CBC  DIFFERENTIAL  COMPREHENSIVE METABOLIC PANEL  RAPID URINE DRUG SCREEN, HOSP PERFORMED  URINALYSIS, ROUTINE W REFLEX MICROSCOPIC  I-STAT CHEM 8, ED  I-STAT TROPONIN, ED  I-STAT BETA  HCG BLOOD, ED ([facility], [facility], [facility] ONLY)    EKG None  EKG Interpretation  Date/Time:  Saturday December 06 2017 13:51:07 EDT Ventricular Rate:  93 PR Interval:    QRS Duration: 80 QT Interval:  351 QTC Calculation: 437 R Axis:   69 Text Interpretation:  Sinus rhythm Anteroseptal infarct, age indeterminate No significant change since last tracing Confirmed by Doug Sou [phone]) on 12/06/2017 2:43:15 PM      Radiology No results found.  Procedures Procedures (including critical care time)  Medications Ordered in ED Medications - No data to display  Results for orders placed or performed during the  hospital encounter of 12/06/17  Ethanol  Result Value Ref Range   Alcohol, Ethyl (B) <10 <10 mg/dL  Protime-INR  Result Value Ref Range   Prothrombin Time 12.1 11.4 - 15.2 seconds   INR 0.90   APTT  Result Value Ref Range   aPTT 25 24 - 36 seconds  CBC  Result Value Ref Range   WBC 9.0 4.0 - 10.5 K/uL   RBC 4.99 3.87 - 5.11 MIL/uL   Hemoglobin 15.4 (H) 12.0 - 15.0 g/dL   HCT 16.1 09.6 - 04.5 %   MCV 88.2 78.0 - 100.0 fL   MCH 30.9 26.0 - 34.0 pg   MCHC 35.0 30.0 - 36.0 g/dL   RDW 40.9 81.1 - 91.4 %   Platelets 243 150 - 400 K/uL  Differential  Result Value Ref Range   Neutrophils Relative % 59 %   Neutro Abs 5.4 1.7 - 7.7 K/uL   Lymphocytes Relative 33 %   Lymphs Abs 2.9 0.7 - 4.0 K/uL   Monocytes Relative 4 %   Monocytes Absolute 0.4 0.1 - 1.0 K/uL   Eosinophils Relative 4 %   Eosinophils Absolute 0.3 0.0 - 0.7 K/uL   Basophils Relative 0 %   Basophils Absolute 0.0 0.0 - 0.1 K/uL  Comprehensive metabolic panel  Result Value Ref Range   Sodium 133 (L) 135 - 145 mmol/L   Potassium 3.9 3.5 - 5.1 mmol/L   Chloride 102 101 - 111 mmol/L   CO2 19 (L) 22 - 32 mmol/L   Glucose, Bld 410 (H) 65 - 99 mg/dL   BUN 10 6 - 20 mg/dL   Creatinine, Ser 7.82 0.44 - 1.00 mg/dL   Calcium 9.2 8.9 - 95.6 mg/dL   Total Protein 6.8 6.5 - 8.1 g/dL   Albumin 3.7 3.5 - 5.0 g/dL   AST 19  15 - 41 U/L   ALT 17 14 - 54 U/L   Alkaline Phosphatase 154 (H) 38 - 126 U/L   Total Bilirubin 0.5 0.3 - 1.2 mg/dL   GFR calc non Af Amer >60 >60 mL/min   GFR calc Af Amer >60 >60 mL/min   Anion gap 12 5 - 15  CBG monitoring, ED  Result Value Ref Range   Glucose-Capillary 389 (H) 65 - 99 mg/dL  I-Stat Chem 8, ED  Result Value Ref Range   Sodium 136 135 - 145 mmol/L   Potassium 4.0 3.5 - 5.1 mmol/L   Chloride 103 101 - 111 mmol/L   BUN 14 6 - 20 mg/dL   Creatinine, Ser 2.13 (L) 0.44 - 1.00 mg/dL   Glucose, Bld 086 (H) 65 - 99 mg/dL   Calcium, Ion 5.78 (L) 1.15 - 1.40 mmol/L   TCO2 21 (L) 22 - 32 mmol/L   Hemoglobin 15.0 12.0 - 15.0 g/dL   HCT 46.9 62.9 - 52.8 %  I-stat troponin, ED  Result Value Ref Range   Troponin i, poc 0.00 0.00 - 0.08 ng/mL   Comment 3          I-Stat beta hCG blood, ED  Result Value Ref Range   I-stat hCG, quantitative 12.5 (H) <5 mIU/mL   Comment 3           Ct Angio Head W Or Wo Contrast  Result Date: 12/06/2017 CLINICAL DATA:  Expressive aphasia.  Right facial droop. EXAM: CT ANGIOGRAPHY HEAD AND NECK TECHNIQUE: Multidetector CT imaging of the head and neck was performed using the standard protocol during bolus administration  of intravenous contrast. Multiplanar CT image reconstructions and MIPs were obtained to evaluate the vascular anatomy. Carotid stenosis measurements (when applicable) are obtained utilizing NASCET criteria, using the distal internal carotid diameter as the denominator. CONTRAST:  50mL ISOVUE-370 IOPAMIDOL (ISOVUE-370) INJECTION 76% COMPARISON:  None. FINDINGS: CTA NECK FINDINGS Aortic arch: Standard 3 vessel aortic arch. Widely patent brachiocephalic and subclavian arteries. Right carotid system: Patent without evidence of stenosis or dissection. Minimal atherosclerotic plaque at the carotid bifurcation. Left carotid system: Patent without evidence of stenosis or dissection. Minimal calcified plaque at the carotid bifurcation. Vertebral  arteries: Patent and codominant without evidence of stenosis or dissection. Calcified nonstenotic plaque at the right vertebral artery origin. Skeleton: C4-C6 ACDF.  Advanced right facet arthrosis at C3-4. Other neck: No mass or enlarged lymph nodes. Upper chest: Clear lung apices. Review of the MIP images confirms the above findings CTA HEAD FINDINGS Anterior circulation: The internal carotid arteries are patent from skull base to carotid termini with minimal non-stenotic plaque on the left. M1 segments and MCA bifurcations are widely patent. There is occlusion of a mid to distal left M2 branch near the posterior insula. ACAs are patent without significant stenosis. No aneurysm or vascular malformation. Posterior circulation: The intracranial vertebral arteries are widely patent to the basilar. Patent PICA and SCA origins are identified bilaterally. The basilar artery is widely patent. There are left larger than right posterior communicating arteries. The PCAs are patent without evidence of significant stenosis. No aneurysm or vascular malformation. Venous sinuses: As permitted by contrast timing, patent. Anatomic variants: None. Delayed phase: Not performed. Review of the MIP images confirms the above findings IMPRESSION: 1. Mid to distal left M2 branch occlusion. 2. No large vessel occlusion or significant proximal stenosis in the head or neck. These results were communicated to Dr. Laurence SlateAroor at 1:48 pm on 12/06/2017 by text page via the [facility] messaging system. Electronically Signed   By: Sebastian AcheAllen  Grady M.D.   On: 12/06/2017 14:13   Ct Angio Neck W Or Wo Contrast  Result Date: 12/06/2017 CLINICAL DATA:  Expressive aphasia.  Right facial droop. EXAM: CT ANGIOGRAPHY HEAD AND NECK TECHNIQUE: Multidetector CT imaging of the head and neck was performed using the standard protocol during bolus administration of intravenous contrast. Multiplanar CT image reconstructions and MIPs were obtained to evaluate the vascular  anatomy. Carotid stenosis measurements (when applicable) are obtained utilizing NASCET criteria, using the distal internal carotid diameter as the denominator. CONTRAST:  50mL ISOVUE-370 IOPAMIDOL (ISOVUE-370) INJECTION 76% COMPARISON:  None. FINDINGS: CTA NECK FINDINGS Aortic arch: Standard 3 vessel aortic arch. Widely patent brachiocephalic and subclavian arteries. Right carotid system: Patent without evidence of stenosis or dissection. Minimal atherosclerotic plaque at the carotid bifurcation. Left carotid system: Patent without evidence of stenosis or dissection. Minimal calcified plaque at the carotid bifurcation. Vertebral arteries: Patent and codominant without evidence of stenosis or dissection. Calcified nonstenotic plaque at the right vertebral artery origin. Skeleton: C4-C6 ACDF.  Advanced right facet arthrosis at C3-4. Other neck: No mass or enlarged lymph nodes. Upper chest: Clear lung apices. Review of the MIP images confirms the above findings CTA HEAD FINDINGS Anterior circulation: The internal carotid arteries are patent from skull base to carotid termini with minimal non-stenotic plaque on the left. M1 segments and MCA bifurcations are widely patent. There is occlusion of a mid to distal left M2 branch near the posterior insula. ACAs are patent without significant stenosis. No aneurysm or vascular malformation. Posterior circulation: The intracranial vertebral arteries are widely  patent to the basilar. Patent PICA and SCA origins are identified bilaterally. The basilar artery is widely patent. There are left larger than right posterior communicating arteries. The PCAs are patent without evidence of significant stenosis. No aneurysm or vascular malformation. Venous sinuses: As permitted by contrast timing, patent. Anatomic variants: None. Delayed phase: Not performed. Review of the MIP images confirms the above findings IMPRESSION: 1. Mid to distal left M2 branch occlusion. 2. No large vessel  occlusion or significant proximal stenosis in the head or neck. These results were communicated to Dr. Laurence Slate at 1:48 pm on 12/06/2017 by text page via the [facility] messaging system. Electronically Signed   By: Sebastian Ache M.D.   On: 12/06/2017 14:13   Ct Head Code Stroke Wo Contrast  Result Date: 12/06/2017 CLINICAL DATA:  Code stroke. Expressive aphasia and right facial droop. EXAM: CT HEAD WITHOUT CONTRAST TECHNIQUE: Contiguous axial images were obtained from the base of the skull through the vertex without intravenous contrast. COMPARISON:  Brain MRI 09/11/2016 FINDINGS: Brain: There is focal hypoattenuation involving the posterior left insula likely reflecting an acute infarct. No acute cortical infarction is identified elsewhere, and there is no evidence of acute basal ganglia infarct. No intracranial hemorrhage, mass, midline shift, or extra-axial fluid collection is identified. There may be a tiny chronic lacunar infarct in the left lentiform nucleus versus a dilated perivascular space. The ventricles and sulci are normal. Vascular: Calcified atherosclerosis at the skull base. Hyperattenuation of a mid to distal M2 branch vessel in the left sylvian fissure. Skull: No fracture focal osseous lesion. Sinuses/Orbits: Mild right posterior ethmoid air cell mucosal thickening. Clear mastoid air cells. Unremarkable orbits. Other: None. ASPECTS [facility] Stroke Program Early CT Score) - Ganglionic level infarction (caudate, lentiform nuclei, internal capsule, insula, M1-M3 cortex): 6 - Supraganglionic infarction (M4-M6 cortex): 3 Total score (0-10 with 10 being normal): 9 IMPRESSION: 1. 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 No hemorrhage. 2. ASPECTS is 9. 3. Suspected embolus in a mid to distal left M2 branch vessel. These results were communicated to Dr. Laurence Slate at 1:48 pm on 12/06/2017 by text page via the [facility] messaging system. Electronically Signed   By: Sebastian Ache M.D.   On: 12/06/2017 13:49    Initial Impression / Assessment and Plan / ED Course  I have reviewed the triage vital signs and the nursing notes.  Pertinent labs & imaging results that were available during my care of the patient were reviewed by me and considered in my medical decision making (see chart for details).     Dr Aroor was present upon patient's arrival and gave order to administer TPA.  Patient to be admitted to the stroke service Patient was transferred from the emergency department to the interventional radiology suite from there she will be admitted to the intensive care unit lab work consistent with hyperglycemia Final Clinical Impressions(s) / ED Diagnoses  Diagnosis#1 cognitive function due to acute stroke #2 hyperglycemia Final diagnoses:  None  CRITICAL CARE Performed by: Doug Sou Total critical care time:  Critical care time was exclusive of separately billable procedures and treating other patients. Critical care was necessary to treat or prevent imminent or life-threatening deterioration. Critical care was time spent personally by me on the following activities: development of treatment plan with patient and/or surrogate as well as nursing, discussions with consultants, evaluation of patient's response to treatment, examination of patient, obtaining history from patient or surrogate, ordering and performing treatments and interventions, ordering and review of  laboratory studies, ordering and review of radiographic studies, pulse oximetry and re-evaluation of patient's condition.  ED Discharge Orders    None       Doug Sou, MD 12/06/17 [phone]

## 2017-12-06 NOTE — Progress Notes (Signed)
Pharmacist Code Stroke Response  Notified to mix tPA at 1328 by Dr. Laurence SlateAroor Delivered tPA to RN at 1332  Issues/delays encountered (if applicable): None  tPA bolus / gtt started at 1334  [facility] EncompassBATCHELDER,Greysen Swanton J 12/06/17 1:46 PM

## 2017-12-06 NOTE — Progress Notes (Signed)
Pt on strict bedrest orders, cannot complete bedside stroke swallow screen.

## 2017-12-06 NOTE — Progress Notes (Signed)
On assessment pts sheath was not being transduced and pressure bag empty, paged Deveshwar. Orders to transduce sheath and replace the bag.   Upon connecting the sheath to a transducer, there was no wave form, after several flushes, sheath now has appropriate waveform and within BP range.  WCTM

## 2017-12-06 NOTE — Anesthesia Procedure Notes (Signed)
Procedure Name: Intubation Date/Time: 12/06/2017 2:39 PM Performed by: Babs Bertin, CRNA Pre-anesthesia Checklist: Patient identified, Emergency Drugs available, Suction available and Patient being monitored Patient Re-evaluated:Patient Re-evaluated prior to induction Oxygen Delivery Method: Circle System Utilized Preoxygenation: Pre-oxygenation with 100% oxygen Induction Type: IV induction Ventilation: Mask ventilation without difficulty Laryngoscope Size: Mac and 3 Grade View: Grade I Tube type: Subglottic suction tube Tube size: 7.5 mm Number of attempts: 1 Airway Equipment and Method: Stylet and Oral airway Placement Confirmation: ETT inserted through vocal cords under direct vision,  positive ETCO2 and breath sounds checked- equal and bilateral Secured at: 21 cm Tube secured with: Tape Dental Injury: Teeth and Oropharynx as per pre-operative assessment

## 2017-12-06 NOTE — Progress Notes (Signed)
PT Cancellation Note  Patient Details Name: Brittany [name] MRN: [id number] DOB: September 09, 1961   Cancelled Treatment:    Reason Eval/Treat Not Completed: Patient at procedure or test/unavailable. PT will continue to f/u with pt as available.    Alessandra BevelsJennifer M Roselynne Lortz 12/06/2017, 4:42 PM

## 2017-12-06 NOTE — H&P (Addendum)
NEUROHOSPITALISTS - STROKE CONSULTATION NOTE   Requesting Physician: Dr. Doug Sou [facility]: Dr. Georgiana Spinner Aroor  Admit date: 12/06/2017    Chief Complaint: Aphasia - Code Stroke  History obtained from:  Patient, family and Chart     HPI   Brittany [name] is an 57 y.o. female with a PMH of HTN, HLD, DM, Hx of prior CVA and tobacco abuse who presents to the ED as a code stroke with reports of acute onset Global Aphasia, last seen normal at approximately 09:15 this morning. CT Head code stroke reveals Acute Left Insula Infarct with suspected embolus in distal Left M2. No contraindications were identified in patients history and IV tPA was administered. IR was notified and patient family was briefed by Dr Laurence Slate. Risks and benefits of IR procedure was explained and family wants to proceed.  Date last known well: Date: 12/06/2017 Time last known well: Time: 09:15 tPA Given: Yes  Modified Rankin: Rankin Score=1 NIH Stroke Scale: 10  Past Medical History    Past Medical History:  Diagnosis Date  . Depression   . Diabetes mellitus without complication (HCC)   . Fibromyalgia   . 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 CHOLECYSTECTOMY    . SPINE SURGERY    . TONSILLECTOMY     Family History   Family History  Problem Relation Age of Onset  . Cancer Mother   . Diabetes Mother   . Heart disease Mother   . Parkinsonism Mother   . Diabetes Father   . Heart disease Father   . Dementia Father   . Cancer Maternal Grandmother 62       lung  . COPD Maternal Aunt    Social History   reports that she has been smoking cigarettes.  She has a 2.00  pack-year smoking history. She has never used smokeless tobacco. She reports that she drinks alcohol. She reports that she does not use drugs.  Allergies  No Known Allergies  Medications Prior to Admission   Current Facility-Administered Medications on File Prior to Encounter  Medication Dose Route Frequency Provider Last Rate Last Dose  . gadopentetate dimeglumine (MAGNEVIST) injection 15 mL  15 mL Intravenous Once PRN Dohmeier, Porfirio Mylar, MD       Current Outpatient Medications on File Prior to Encounter  Medication Sig Dispense Refill  . albuterol (PROVENTIL) (2.5 MG/3ML) 0.083% nebulizer solution Take 3 mLs (2.5 mg total) by nebulization every 6 (six) hours as needed for wheezing or shortness of breath. 150 mL 1  . Albuterol Sulfate (PROAIR RESPICLICK) 108 (90 Base) MCG/ACT AEPB Inhale 2 puffs into the lungs every 4 (four) hours as needed. 1 each 2  . aspirin 325 MG tablet Take 325 mg by mouth daily.    Marland Kitchen atorvastatin (LIPITOR) 40 MG tablet Take 1 tablet (40 mg total) by mouth daily. 90 tablet 3  . B Complex-Biotin-FA (MULTI-B COMPLEX) CAPS Take one a day ,B complex  multivitamin over the counter or use prenatal vitamin. 90 capsule 0  . BD PEN NEEDLE NANO U/F 32G X 4 MM MISC USE WITH VICTOZA PEN 100 each 11  . Canagliflozin-Metformin HCl ER (INVOKAMET XR) 50-1000 MG TB24 Take 2 tablets by mouth daily. 180 tablet 1  . cholecalciferol (VITAMIN D) 1000 UNITS tablet  Take 1,000 Units by mouth daily.    Marland Kitchen. estradiol (ESTRACE) 0.1 MG/GM vaginal cream Apply to perineum at night x 2 weeks, then every other night. 42.5 g 3  . liraglutide (VICTOZA) 18 MG/3ML SOPN Inject 0.3 mLs (1.8 mg total) into the skin daily. 10 pen 1  . losartan (COZAAR) 50 MG tablet Take 1 tablet (50 mg total) by mouth daily. 90 tablet 3  . metFORMIN (GLUCOPHAGE) 500 MG tablet Take by mouth 4 (four) times daily.    . primidone (MYSOLINE) 250 MG tablet Take 1 tablet (250 mg total) by mouth at bedtime. 90 tablet 3  . sertraline  (ZOLOFT) 100 MG tablet Take 2 tablets (200 mg total) by mouth at bedtime. 180 tablet 3  . sitaGLIPtin (JANUVIA) 100 MG tablet Take 1 tablet (100 mg total) by mouth daily. 90 tablet 1    ROS  History obtained from unobtainable from patient due to language barrier - Global Aphagia  No history of recent illness or travel. No history of N/V/D per family. Patient does not appear to be in pain.  Physical Examination   Vitals:   12/06/17 1350 12/06/17 1354 12/06/17 1358 12/06/17 1400  BP: 131/77 131/77  124/77  Pulse: 92 92 92 88  Resp: 16 16  13   Temp:  98.6 F (37 C)    TempSrc:  Oral    SpO2: 94% 92% 94% 94%  Weight:       HEENT-  Normocephalic,  Cardiovascular - Regular rate and rhythm  Respiratory - Non-labored breathing, No wheezing. Abdomen - soft and non-tender, BS normal Extremities- no edema or cyanosis Skin-warm and dry  Neurological Examination  Mental Status: Alert, Severely Aphasic. Attempts to communicate with examiner.  Able to intermittently follow some simple commands. Cranial Nerves: II: Visual Field - right partial hemianopsia. Pupils are equal, round, and reactive to light.   III,IV, VI: EOMI without ptosis. No nystagmus.   V: Facial sensation is symmetric touch/temperature VII: Facial movement - mild right nasal labial fold flattening.  VIII: hearing is intact to voice X: Uvula elevates appears symmetric XI: Shoulder shrug is symmetric. XII: tongue is midline without atrophy or fasciculations.  Motor: Tone is normal. Bulk is normal.  5/5 strength  In UE's and 4+/5 in LE's Sensor: withdrawals to noxious stimuli in all 4 extremities Deep Tendon Reflexes: 2+ and symmetric throughout in the biceps and patellae Plantars: Toes are downgoing bilaterally.  Cerebellar: normal finger-to-nose, no obvious ataxia Gait: not tested  Laboratory Results  CBC: Recent Labs  Lab 12/06/17 1300 12/06/17 1333  WBC 9.0  --   HGB 15.4* 15.0  HCT 44.0 44.0  MCV 88.2  --    PLT 243  --    Basic Metabolic Panel: Recent Labs  Lab 12/06/17 1333  NA 136  K 4.0  CL 103  GLUCOSE 420*  BUN 14  CREATININE 0.40*   Liver Function Tests: No results for input(s): LIPASE, AMYLASE in the last 168 hours. No results for input(s): AMMONIA in the last 168 hours. Thyroid Function Studies: No results for input(s): TSH, T4TOTAL, T3FREE, THYROIDAB in the last 72 hours. Cardiac Enzymes: No results for input(s): CKTOTAL, CKMB, CKMBINDEX, TROPONINI in the last 168 hours. Coagulation Studies: Recent Labs    12/06/17 1300  APTT 25  INR 0.90   No results for input(s): DDIMER in the last 72 hours. Amenia Work -Up No results for input(s): VITAMINB12, FOLATE, IRON, RETICCTPCT in the last 72 hours. Microbiology: @MICRORSLT2 @ Urinalysis: No results  for input(s): COLORURINE, APPEARANCEUR, LABSPEC, PHURINE, GLUCOSEU, HGBUR, BILIRUBINUR, KETONESUR, PROTEINUR, UROBILINOGEN, NITRITE, LEUKOCYTESUR in the last 168 hours. Urine Drug Screen:  No results found for: LABOPIA, COCAINSCRNUR, LABBENZ, AMPHETMU, THCU, LABBARB  Alcohol Level: No results for input(s): ETH in the last 168 hours.  Imaging Results  Ct Angio Head W Or Wo Contrast  Result Date: 12/06/2017 CLINICAL DATA:  Expressive aphasia.  Right facial droop. EXAM: CT ANGIOGRAPHY HEAD AND NECK TECHNIQUE: Multidetector CT imaging of the head and neck was performed using the standard protocol during bolus administration of intravenous contrast. Multiplanar CT image reconstructions and MIPs were obtained to evaluate the vascular anatomy. Carotid stenosis measurements (when applicable) are obtained utilizing NASCET criteria, using the distal internal carotid diameter as the denominator. CONTRAST:  50mL ISOVUE-370 IOPAMIDOL (ISOVUE-370) INJECTION 76% COMPARISON:  None. FINDINGS: CTA NECK FINDINGS Aortic arch: Standard 3 vessel aortic arch. Widely patent brachiocephalic and subclavian arteries. Right carotid system: Patent without  evidence of stenosis or dissection. Minimal atherosclerotic plaque at the carotid bifurcation. Left carotid system: Patent without evidence of stenosis or dissection. Minimal calcified plaque at the carotid bifurcation. Vertebral arteries: Patent and codominant without evidence of stenosis or dissection. Calcified nonstenotic plaque at the right vertebral artery origin. Skeleton: C4-C6 ACDF.  Advanced right facet arthrosis at C3-4. Other neck: No mass or enlarged lymph nodes. Upper chest: Clear lung apices. Review of the MIP images confirms the above findings CTA HEAD FINDINGS Anterior circulation: The internal carotid arteries are patent from skull base to carotid termini with minimal non-stenotic plaque on the left. M1 segments and MCA bifurcations are widely patent. There is occlusion of a mid to distal left M2 branch near the posterior insula. ACAs are patent without significant stenosis. No aneurysm or vascular malformation. Posterior circulation: The intracranial vertebral arteries are widely patent to the basilar. Patent PICA and SCA origins are identified bilaterally. The basilar artery is widely patent. There are left larger than right posterior communicating arteries. The PCAs are patent without evidence of significant stenosis. No aneurysm or vascular malformation. Venous sinuses: As permitted by contrast timing, patent. Anatomic variants: None. Delayed phase: Not performed. Review of the MIP images confirms the above findings IMPRESSION: 1. Mid to distal left M2 branch occlusion. 2. No large vessel occlusion or significant proximal stenosis in the head or neck. These results were communicated to Dr. Laurence Slate at 1:48 pm on 12/06/2017 by text page via the [facility] messaging system. Electronically Signed   By: Sebastian Ache M.D.   On: 12/06/2017 14:13   Ct Angio Neck W Or Wo Contrast  Result Date: 12/06/2017 CLINICAL DATA:  Expressive aphasia.  Right facial droop. EXAM: CT ANGIOGRAPHY HEAD AND NECK TECHNIQUE:  Multidetector CT imaging of the head and neck was performed using the standard protocol during bolus administration of intravenous contrast. Multiplanar CT image reconstructions and MIPs were obtained to evaluate the vascular anatomy. Carotid stenosis measurements (when applicable) are obtained utilizing NASCET criteria, using the distal internal carotid diameter as the denominator. CONTRAST:  50mL ISOVUE-370 IOPAMIDOL (ISOVUE-370) INJECTION 76% COMPARISON:  None. FINDINGS: CTA NECK FINDINGS Aortic arch: Standard 3 vessel aortic arch. Widely patent brachiocephalic and subclavian arteries. Right carotid system: Patent without evidence of stenosis or dissection. Minimal atherosclerotic plaque at the carotid bifurcation. Left carotid system: Patent without evidence of stenosis or dissection. Minimal calcified plaque at the carotid bifurcation. Vertebral arteries: Patent and codominant without evidence of stenosis or dissection. Calcified nonstenotic plaque at the right vertebral artery origin. Skeleton: C4-C6 ACDF.  Advanced right facet arthrosis at C3-4. Other neck: No mass or enlarged lymph nodes. Upper chest: Clear lung apices. Review of the MIP images confirms the above findings CTA HEAD FINDINGS Anterior circulation: The internal carotid arteries are patent from skull base to carotid termini with minimal non-stenotic plaque on the left. M1 segments and MCA bifurcations are widely patent. There is occlusion of a mid to distal left M2 branch near the posterior insula. ACAs are patent without significant stenosis. No aneurysm or vascular malformation. Posterior circulation: The intracranial vertebral arteries are widely patent to the basilar. Patent PICA and SCA origins are identified bilaterally. The basilar artery is widely patent. There are left larger than right posterior communicating arteries. The PCAs are patent without evidence of significant stenosis. No aneurysm or vascular malformation. Venous sinuses: As  permitted by contrast timing, patent. Anatomic variants: None. Delayed phase: Not performed. Review of the MIP images confirms the above findings IMPRESSION: 1. Mid to distal left M2 branch occlusion. 2. No large vessel occlusion or significant proximal stenosis in the head or neck. These results were communicated to Dr. Laurence Slate at 1:48 pm on 12/06/2017 by text page via the [facility] messaging system. Electronically Signed   By: Sebastian Ache M.D.   On: 12/06/2017 14:13   Ct Head Code Stroke Wo Contrast  Result Date: 12/06/2017 CLINICAL DATA:  Code stroke. Expressive aphasia and right facial droop. EXAM: CT HEAD WITHOUT CONTRAST TECHNIQUE: Contiguous axial images were obtained from the base of the skull through the vertex without intravenous contrast. COMPARISON:  Brain MRI 09/11/2016 FINDINGS: Brain: There is focal hypoattenuation involving the posterior left insula likely reflecting an acute infarct. No acute cortical infarction is identified elsewhere, and there is no evidence of acute basal ganglia infarct. No intracranial hemorrhage, mass, midline shift, or extra-axial fluid collection is identified. There may be a tiny chronic lacunar infarct in the left lentiform nucleus versus a dilated perivascular space. The ventricles and sulci are normal. Vascular: Calcified atherosclerosis at the skull base. Hyperattenuation of a mid to distal M2 branch vessel in the left sylvian fissure. Skull: No fracture focal osseous lesion. Sinuses/Orbits: Mild right posterior ethmoid air cell mucosal thickening. Clear mastoid air cells. Unremarkable orbits. Other: None. ASPECTS [facility] Stroke Program Early CT Score) - Ganglionic level infarction (caudate, lentiform nuclei, internal capsule, insula, M1-M3 cortex): 6 - Supraganglionic infarction (M4-M6 cortex): 3 Total score (0-10 with 10 being normal): 9 IMPRESSION: 1. 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 No hemorrhage. 2. ASPECTS is 9. 3. Suspected embolus in a mid to  distal left M2 branch vessel. These results were communicated to Dr. Laurence Slate at 1:48 pm on 12/06/2017 by text page via the [facility] messaging system. Electronically Signed   By: Sebastian Ache M.D.   On: 12/06/2017 13:49         IMPRESSION AND PLAN  Brittany [name] is a 57 y.o. female with PMH of PMH of HTN, HLD, DM, Hx of prior CVA and tobacco abuse who presents to the ED as a code stroke with reports of acute onset Global Aphasia, last seen normal at approximately 09:15 this morning. CT Head code stroke reveals Acute Left Insula Infarct with suspected embolus in distal Left M2. No contraindications were identified in patients history and IV tPA was administered. IR was notified and patient family was briefed by Dr Laurence Slate. Risks and benefits of IR procedure was explained and family wants to proceed.  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 and mechanical thrombectomy  of Left Distal M2 embolus  Post Mechanical thrombectomy small SAH    Suspected Etiology: Distal Left M2 occlusion Resultant Symptoms: Global Aphasia Stroke Risk Factors: diabetes mellitus, hyperlipidemia, hypertension and smoking Other Stroke Risk Factors: Advanced age, Hx CVA, OSA  Plan  Will admit to ICU Frequent Neurochecks  Telemetry Monitoring NPO until passes Stroke swallow screen Continue Statin HOLD Aspirin until 24 hour post tPA neuroimaging is stable and without evidence of bleeding BP goal less than 140 systolic Repeat CT head tomorrow morning to assess hemorrhage  HgbA1C and Lipid Profile  Outstanding Stroke Work-up Studies:     Repeat CT Head in 24 hrs                                       PENDING MRI of the Brain                                                      PENDING Echocardiogram:                                                    PENDING    Dysphagia  NPO until passes SLP swallow evaluation Aspiration Precautions in progress  HYPERTENSION: SBP goal of 120-140  BP goal: Parameters set by IR  team Given successful l thrombectomy usually in the 120-140 SBP range Nicardipine drip, Labetolol PRN Long term BP goal normotensive.   Home Meds:  Lipitor 40 mg No results for input(s): AST, ALT, ALKPHOS, BILITOT, PROT, ALBUMIN in the last 168 hours. LDL  goal < 70 Continued on  Lipitor to 40 mg daily Continue statin at discharge  HgbA1c goal < 7.0 Currently on: Novolog Continue CBG monitoring and SSI to maintain glucose 140-180 mg/dl DM education   TOBACCO ABUSE Current smoker Smoking cessation counseling provided Nicotine patch provided  Hospital day # 0 VTE prophylaxis: SCD's  Diet - Diet NPO time specified    PT/OT/SLP Consult PM & Rehab Consult Case Management /MSW Ongoing aggressive stroke risk factor management Patient will be counseled to be compliant with her antithrombotic medications Patient will be counseled on Lifestyle modifications including, Diet, Exercise, and Stress Follow up with [facility] in 6 weeks     Assessment and plan discussed with with attending physician and they are in agreement.    Beryl Meager, ANP-C [facility] 12/06/2017, 2:20 PM  12/06/2017 ATTENDING ASSESSMENT   I have reviewed history, exam as documented above by ARNP. I have formulated plan documented above by ARNP  with few changes made as above.  I saw this patient as stroke alert at time of arrival. Had disabling stroke with profound aphasia. Administered tPA as well discussed decision for mechanical thrombectomy as NIHSS 10 and CTA showed distal m2 occlusion. She received TICI 2 B recanalization, however small SAH vs contrast noted afterwards, Clinically not worse then on arrival.  Will repeat Ct head midnight.     This patient is neurologically critically ill due to Left MCA stroke s/p tPA and EMT.  She is at risk for significant risk of neurological worsening  from cerebral edema,  death from brain herniation, heart failure, hemorrhagic  conversion, infection, respiratory failure and seizure. This patient's care requires constant monitoring of vital signs, hemodynamics, respiratory and cardiac monitoring, review of multiple databases, neurological assessment, discussion with family, other specialists and medical decision making of high complexity.  I spent  75   minutes of neurocritical time in the care of this patient.    Georgiana Spinner Aroor MD [facility] [phone]  If 7pm to 7am, please call on call as listed on AMION.   Please page stroke NP/ PA / MD from 8am -4 pm   You can look them up on [email or web address]  Password [email or web address]

## 2017-12-06 NOTE — Code Documentation (Signed)
Patient was last known well this am at 0915.  Later in day family noted that she was not speaking well.  EMS called Code Stroke en route to ED at 1313.  Patient arrived at 1320  Stat labs and head CT done.  Patient with global aphasia, mild neglect and facial droop, partial visual field cut.  TPA gvien at 1334.  Then CTA done.  Family at bedside.  Patient to IR.

## 2017-12-06 NOTE — Progress Notes (Signed)
Patient ID: Jenne Campusenise W Bogard, female   DOB: [birth year], 57 y.o.   MRN: [id number] [age]yr  old RT H female LSW 9.15 am  Premorbid mRSscore 0  Developed global aphasia with ? Mild rt sided weakness . CT brain NO ICH ASPECTS9 CTA occluded distal inferior division of LT MCA.  Option of endovascular revascularization of blocked artery discussed with patients spouse and son. Reasons procedure  ,risks and alternatives reviewed. Risks of ICH of 10 to 15 % ,worsening neurological function, vent dependency,death and inability to revascularize were reviewed in detail .Questions were answered to their satisfaction. Informed witnessed consent was obtained for endovascular treatment.Marland Kitchen. S.Reza Crymes MD

## 2017-12-06 NOTE — ED Notes (Signed)
THis RN in IR with patient.

## 2017-12-06 NOTE — Anesthesia Procedure Notes (Signed)
Arterial Line Insertion Start/End3/30/2019 2:43 PM, 12/06/2017 2:45 PM Performed by: Shelton SilvasHollis, Kevin D, MD, Peyten Punches, Howie Illarrie B, CRNA, CRNA  Preanesthetic checklist: IV checked, monitors and equipment checked and pre-op evaluation Left, radial was placed Catheter size: 20 G Hand hygiene performed  and maximum sterile barriers used   Attempts: 1 Procedure performed without using ultrasound guided technique. Ultrasound Notes:anatomy identified Following insertion, dressing applied and Biopatch.

## 2017-12-07 ENCOUNTER — Inpatient Hospital Stay (HOSPITAL_COMMUNITY): Payer: BLUE CROSS/BLUE SHIELD

## 2017-12-07 ENCOUNTER — Encounter (HOSPITAL_COMMUNITY)
Admission: EM | Disposition: A | Discharge status: Home Health | Payer: Self-pay | Source: Home / Self Care | Attending: Neurology

## 2017-12-07 ENCOUNTER — Inpatient Hospital Stay (HOSPITAL_COMMUNITY): Payer: BLUE CROSS/BLUE SHIELD | Admitting: Certified Registered"

## 2017-12-07 DIAGNOSIS — I998 Other disorder of circulatory system: Secondary | ICD-10-CM

## 2017-12-07 HISTORY — PX: THROMBECTOMY FEMORAL ARTERY: SHX6406

## 2017-12-07 LAB — BASIC METABOLIC PANEL
Anion gap: 9 (ref 5–15)
BUN: 7 mg/dL (ref 6–20)
CHLORIDE: 108 mmol/L (ref 101–111)
CO2: 19 mmol/L — ABNORMAL LOW (ref 22–32)
CREATININE: 0.51 mg/dL (ref 0.44–1.00)
Calcium: 7.9 mg/dL — ABNORMAL LOW (ref 8.9–10.3)
GFR calc Af Amer: >60 mL/min (ref >60)
GFR calc non Af Amer: >60 mL/min (ref >60)
GLUCOSE: 345 mg/dL — AB (ref 65–99)
Potassium: 3.7 mmol/L (ref 3.5–5.1)
SODIUM: 136 mmol/L (ref 135–145)

## 2017-12-07 LAB — GLUCOSE, CAPILLARY
GLUCOSE-CAPILLARY: 315 mg/dL — AB (ref 65–99)
GLUCOSE-CAPILLARY: 314 mg/dL — AB (ref 65–99)
GLUCOSE-CAPILLARY: 325 mg/dL — AB (ref 65–99)
GLUCOSE-CAPILLARY: 275 mg/dL — AB (ref 65–99)
GLUCOSE-CAPILLARY: 255 mg/dL — AB (ref 65–99)
GLUCOSE-CAPILLARY: 168 mg/dL — AB (ref 65–99)
GLUCOSE-CAPILLARY: 151 mg/dL — AB (ref 65–99)
GLUCOSE-CAPILLARY: 192 mg/dL — AB (ref 65–99)
GLUCOSE-CAPILLARY: 166 mg/dL — AB (ref 65–99)
GLUCOSE-CAPILLARY: 123 mg/dL — AB (ref 65–99)
GLUCOSE-CAPILLARY: 159 mg/dL — AB (ref 65–99)
Glucose-Capillary: 201 mg/dL — ABNORMAL HIGH (ref 65–99)
Glucose-Capillary: 135 mg/dL — ABNORMAL HIGH (ref 65–99)
Glucose-Capillary: 130 mg/dL — ABNORMAL HIGH (ref 65–99)
Glucose-Capillary: 130 mg/dL — ABNORMAL HIGH (ref 65–99)
Glucose-Capillary: 179 mg/dL — ABNORMAL HIGH (ref 65–99)
Glucose-Capillary: 125 mg/dL — ABNORMAL HIGH (ref 65–99)
Glucose-Capillary: 128 mg/dL — ABNORMAL HIGH (ref 65–99)
Glucose-Capillary: 182 mg/dL — ABNORMAL HIGH (ref 65–99)
Glucose-Capillary: 156 mg/dL — ABNORMAL HIGH (ref 65–99)

## 2017-12-07 LAB — HIV ANTIBODY (ROUTINE TESTING W REFLEX): HIV SCREEN 4TH GENERATION: NONREACTIVE

## 2017-12-07 LAB — LIPID PANEL
CHOL/HDL RATIO: 4 ratio
Cholesterol: 161 mg/dL (ref 0–200)
HDL: 40 mg/dL — AB (ref >40)
LDL CALC: 106 mg/dL — AB (ref 0–99)
Triglycerides: 75 mg/dL (ref <150)
VLDL: 15 mg/dL (ref 0–40)

## 2017-12-07 LAB — CBC WITH DIFFERENTIAL/PLATELET
Basophils Absolute: 0 10*3/uL (ref 0.0–0.1)
Basophils Relative: 0 %
EOS PCT: 0 %
Eosinophils Absolute: 0 10*3/uL (ref 0.0–0.7)
HCT: 34 % — ABNORMAL LOW (ref 36.0–46.0)
Hemoglobin: 11.7 g/dL — ABNORMAL LOW (ref 12.0–15.0)
LYMPHS ABS: 0.8 10*3/uL (ref 0.7–4.0)
Lymphocytes Relative: 5 %
MCH: 30.5 pg (ref 26.0–34.0)
MCHC: 34.4 g/dL (ref 30.0–36.0)
MCV: 88.8 fL (ref 78.0–100.0)
MONOS PCT: 1 %
Monocytes Absolute: 0.2 10*3/uL (ref 0.1–1.0)
Neutro Abs: 15.9 10*3/uL — ABNORMAL HIGH (ref 1.7–7.7)
Neutrophils Relative %: 94 %
PLATELETS: 176 10*3/uL (ref 150–400)
RBC: 3.83 MIL/uL — ABNORMAL LOW (ref 3.87–5.11)
RDW: 11.9 % (ref 11.5–15.5)
WBC: 16.9 10*3/uL — ABNORMAL HIGH (ref 4.0–10.5)

## 2017-12-07 LAB — URINALYSIS, COMPLETE (UACMP) WITH MICROSCOPIC
Bacteria, UA: NONE SEEN
Bilirubin Urine: NEGATIVE
Ketones, ur: 5 mg/dL — AB
NITRITE: NEGATIVE
Protein, ur: NEGATIVE mg/dL
SPECIFIC GRAVITY, URINE: 1.025 (ref 1.005–1.030)
pH: 5 (ref 5.0–8.0)

## 2017-12-07 LAB — HEMOGLOBIN A1C
HEMOGLOBIN A1C: 11.5 % — AB (ref 4.8–5.6)
Mean Plasma Glucose: 283.35 mg/dL

## 2017-12-07 SURGERY — THROMBECTOMY, ARTERY, FEMORAL
Anesthesia: General | Site: Leg Lower | Laterality: Right

## 2017-12-07 MED ORDER — MIDAZOLAM HCL 2 MG/2ML IJ SOLN
INTRAMUSCULAR | Status: AC
  Filled 2017-12-07: qty 2

## 2017-12-07 MED ORDER — CEFAZOLIN SODIUM 1 G IJ SOLR
INTRAMUSCULAR | Status: AC
  Filled 2017-12-07: qty 20

## 2017-12-07 MED ORDER — SUFENTANIL CITRATE 50 MCG/ML IV SOLN
INTRAVENOUS | Status: DC | PRN
  Administered 2017-12-07: 10 ug via INTRAVENOUS
  Administered 2017-12-07: 5 ug via INTRAVENOUS

## 2017-12-07 MED ORDER — ROCURONIUM BROMIDE 100 MG/10ML IV SOLN
INTRAVENOUS | Status: DC | PRN
  Administered 2017-12-07: 50 mg via INTRAVENOUS

## 2017-12-07 MED ORDER — SODIUM CHLORIDE 0.9 % IV SOLN
INTRAVENOUS | Status: DC
  Administered 2017-12-07: 2.6 [IU]/h via INTRAVENOUS
  Administered 2017-12-07: 4 [IU]/h via INTRAVENOUS
  Filled 2017-12-07 (×2): qty 1

## 2017-12-07 MED ORDER — SUGAMMADEX SODIUM 200 MG/2ML IV SOLN
INTRAVENOUS | Status: DC | PRN
  Administered 2017-12-07: 200 mg via INTRAVENOUS

## 2017-12-07 MED ORDER — DEXAMETHASONE SODIUM PHOSPHATE 10 MG/ML IJ SOLN
INTRAMUSCULAR | Status: DC | PRN
  Administered 2017-12-07: 10 mg via INTRAVENOUS

## 2017-12-07 MED ORDER — LACTATED RINGERS IV SOLN: INTRAVENOUS | Status: DC

## 2017-12-07 MED ORDER — LIDOCAINE HCL (CARDIAC) 20 MG/ML IV SOLN
INTRAVENOUS | Status: DC | PRN
  Administered 2017-12-07: 50 mg via INTRATRACHEAL

## 2017-12-07 MED ORDER — SODIUM CHLORIDE 0.9 % IJ SOLN
INTRAMUSCULAR | Status: AC
  Filled 2017-12-07: qty 10

## 2017-12-07 MED ORDER — 0.9 % SODIUM CHLORIDE (POUR BTL) OPTIME
TOPICAL | Status: DC | PRN
  Administered 2017-12-07: 2000 mL

## 2017-12-07 MED ORDER — ASPIRIN 300 MG RE SUPP
300.0000 mg | Freq: Every day | RECTAL | Status: DC
  Administered 2017-12-07 – 2017-12-08 (×2): 300 mg via RECTAL
  Filled 2017-12-07 (×2): qty 1

## 2017-12-07 MED ORDER — ONDANSETRON HCL 4 MG/2ML IJ SOLN
INTRAMUSCULAR | Status: AC
  Filled 2017-12-07: qty 2

## 2017-12-07 MED ORDER — HYDROMORPHONE HCL 1 MG/ML IJ SOLN: 0.2500 mg | INTRAMUSCULAR | Status: DC | PRN

## 2017-12-07 MED ORDER — LIDOCAINE HCL (CARDIAC) 20 MG/ML IV SOLN
INTRAVENOUS | Status: AC
  Filled 2017-12-07: qty 5

## 2017-12-07 MED ORDER — HEPARIN SODIUM (PORCINE) 1000 UNIT/ML IJ SOLN
INTRAMUSCULAR | Status: DC | PRN
  Administered 2017-12-07: 6000 [IU] via INTRAVENOUS

## 2017-12-07 MED ORDER — LACTATED RINGERS IV SOLN
INTRAVENOUS | Status: DC | PRN
  Administered 2017-12-07: 02:00:00 via INTRAVENOUS

## 2017-12-07 MED ORDER — SUGAMMADEX SODIUM 200 MG/2ML IV SOLN
INTRAVENOUS | Status: AC
  Filled 2017-12-07: qty 2

## 2017-12-07 MED ORDER — SODIUM CHLORIDE 0.9 % IV BOLUS
1000.0000 mL | Freq: Once | INTRAVENOUS | Status: AC
  Administered 2017-12-07: 1000 mL via INTRAVENOUS

## 2017-12-07 MED ORDER — CEFAZOLIN SODIUM-DEXTROSE 2-3 GM-%(50ML) IV SOLR
INTRAVENOUS | Status: DC | PRN
  Administered 2017-12-07: 2 g via INTRAVENOUS

## 2017-12-07 MED ORDER — PROPOFOL 10 MG/ML IV BOLUS
INTRAVENOUS | Status: AC
  Filled 2017-12-07: qty 20

## 2017-12-07 MED ORDER — ONDANSETRON HCL 4 MG/2ML IJ SOLN
INTRAMUSCULAR | Status: DC | PRN
  Administered 2017-12-07: 4 mg via INTRAVENOUS

## 2017-12-07 MED ORDER — SODIUM CHLORIDE 0.9 % IV SOLN
INTRAVENOUS | Status: AC
  Filled 2017-12-07: qty 1.2

## 2017-12-07 MED ORDER — SODIUM CHLORIDE 0.9 % IV SOLN
INTRAVENOUS | Status: DC | PRN
  Administered 2017-12-07: 500 mL

## 2017-12-07 MED ORDER — PROMETHAZINE HCL 25 MG/ML IJ SOLN: 6.2500 mg | INTRAMUSCULAR | Status: DC | PRN

## 2017-12-07 MED ORDER — DEXAMETHASONE SODIUM PHOSPHATE 10 MG/ML IJ SOLN
INTRAMUSCULAR | Status: AC
  Filled 2017-12-07: qty 1

## 2017-12-07 MED ORDER — MEPERIDINE HCL 50 MG/ML IJ SOLN: 6.2500 mg | INTRAMUSCULAR | Status: DC | PRN

## 2017-12-07 MED ORDER — SUFENTANIL CITRATE 50 MCG/ML IV SOLN
INTRAVENOUS | Status: AC
  Filled 2017-12-07: qty 1

## 2017-12-07 MED ORDER — PROPOFOL 10 MG/ML IV BOLUS
INTRAVENOUS | Status: DC | PRN
  Administered 2017-12-07: 120 mg via INTRAVENOUS

## 2017-12-07 MED ORDER — SODIUM CHLORIDE 0.9 % IJ SOLN
INTRAVENOUS | Status: DC | PRN
  Administered 2017-12-07: 50 mL via INTRAMUSCULAR

## 2017-12-07 SURGICAL SUPPLY — 67 items
ADH SKN CLS APL DERMABOND .7 (GAUZE/BANDAGES/DRESSINGS) ×2
BANDAGE ACE 4X5 VEL STRL LF (GAUZE/BANDAGES/DRESSINGS) IMPLANT
BANDAGE ESMARK 6X9 LF (GAUZE/BANDAGES/DRESSINGS) IMPLANT
BNDG CMPR 9X6 STRL LF SNTH (GAUZE/BANDAGES/DRESSINGS)
BNDG ESMARK 6X9 LF (GAUZE/BANDAGES/DRESSINGS)
CANISTER SUCT 3000ML PPV (MISCELLANEOUS) ×3 IMPLANT
CANNULA VESSEL 3MM 2 BLNT TIP (CANNULA) IMPLANT
CATH EMB 3FR 80CM (CATHETERS) ×1 IMPLANT
CATH EMB 4FR 40CM (CATHETERS) ×1 IMPLANT
CATH EMB 4FR 80CM (CATHETERS) IMPLANT
CATH EMB 5FR 80CM (CATHETERS) IMPLANT
CLIP VESOCCLUDE MED 24/CT (CLIP) ×3 IMPLANT
CLIP VESOCCLUDE SM WIDE 24/CT (CLIP) ×3 IMPLANT
CUFF TOURNIQUET SINGLE 24IN (TOURNIQUET CUFF) IMPLANT
CUFF TOURNIQUET SINGLE 34IN LL (TOURNIQUET CUFF) IMPLANT
CUFF TOURNIQUET SINGLE 44IN (TOURNIQUET CUFF) IMPLANT
DERMABOND ADVANCED (GAUZE/BANDAGES/DRESSINGS) ×1
DERMABOND ADVANCED .7 DNX12 (GAUZE/BANDAGES/DRESSINGS) ×2 IMPLANT
DRAIN CHANNEL 15F RND FF W/TCR (WOUND CARE) IMPLANT
DRAPE HALF SHEET 40X57 (DRAPES) IMPLANT
DRAPE X-RAY CASS 24X20 (DRAPES) IMPLANT
ELECT REM PT RETURN 9FT ADLT (ELECTROSURGICAL) ×3
ELECTRODE REM PT RTRN 9FT ADLT (ELECTROSURGICAL) ×2 IMPLANT
EVACUATOR SILICONE 100CC (DRAIN) IMPLANT
GLOVE BIO SURGEON STRL SZ7.5 (GLOVE) ×3 IMPLANT
GLOVE BIOGEL M 7.0 STRL (GLOVE) ×1 IMPLANT
GLOVE BIOGEL M STRL SZ7.5 (GLOVE) ×1 IMPLANT
GLOVE BIOGEL PI IND STRL 7.0 (GLOVE) IMPLANT
GLOVE BIOGEL PI IND STRL 7.5 (GLOVE) IMPLANT
GLOVE BIOGEL PI INDICATOR 7.0 (GLOVE) ×1
GLOVE BIOGEL PI INDICATOR 7.5 (GLOVE) ×2
GOWN STRL REUS W/ TWL LRG LVL3 (GOWN DISPOSABLE) ×4 IMPLANT
GOWN STRL REUS W/ TWL XL LVL3 (GOWN DISPOSABLE) ×2 IMPLANT
GOWN STRL REUS W/TWL LRG LVL3 (GOWN DISPOSABLE) ×9
GOWN STRL REUS W/TWL XL LVL3 (GOWN DISPOSABLE) ×3
INSERT FOGARTY SM (MISCELLANEOUS) IMPLANT
KIT BASIN OR (CUSTOM PROCEDURE TRAY) ×3 IMPLANT
KIT TURNOVER KIT B (KITS) ×3 IMPLANT
MARKER GRAFT CORONARY BYPASS (MISCELLANEOUS) IMPLANT
NS IRRIG 1000ML POUR BTL (IV SOLUTION) ×6 IMPLANT
PACK PERIPHERAL VASCULAR (CUSTOM PROCEDURE TRAY) ×3 IMPLANT
PAD ARMBOARD 7.5X6 YLW CONV (MISCELLANEOUS) ×6 IMPLANT
SET COLLECT BLD 21X3/4 12 (NEEDLE) IMPLANT
SET MICROPUNCTURE 5F STIFF (MISCELLANEOUS) ×1 IMPLANT
STOPCOCK 4 WAY LG BORE MALE ST (IV SETS) IMPLANT
SUT ETHILON 3 0 PS 1 (SUTURE) IMPLANT
SUT GORETEX 6.0 TT13 (SUTURE) IMPLANT
SUT GORETEX 6.0 TT9 (SUTURE) IMPLANT
SUT MNCRL AB 4-0 PS2 18 (SUTURE) ×6 IMPLANT
SUT PROLENE 5 0 C 1 24 (SUTURE) ×6 IMPLANT
SUT PROLENE 6 0 BV (SUTURE) ×4 IMPLANT
SUT PROLENE 7 0 BV 1 (SUTURE) IMPLANT
SUT SILK 2 0 SH (SUTURE) ×3 IMPLANT
SUT SILK 3 0 (SUTURE)
SUT SILK 3-0 18XBRD TIE 12 (SUTURE) IMPLANT
SUT VIC AB 2-0 CT1 27 (SUTURE) ×6
SUT VIC AB 2-0 CT1 TAPERPNT 27 (SUTURE) ×4 IMPLANT
SUT VIC AB 3-0 SH 27 (SUTURE) ×6
SUT VIC AB 3-0 SH 27X BRD (SUTURE) ×4 IMPLANT
SYR 3ML LL SCALE MARK (SYRINGE) ×1 IMPLANT
SYRINGE 10CC LL (SYRINGE) ×2 IMPLANT
TAPE UMBILICAL COTTON 1/8X30 (MISCELLANEOUS) IMPLANT
TOWEL GREEN STERILE (TOWEL DISPOSABLE) ×3 IMPLANT
TRAY FOLEY MTR SLVR 16FR STAT (CATHETERS) IMPLANT
TUBING EXTENTION W/L.L. (IV SETS) IMPLANT
UNDERPAD 30X30 (UNDERPADS AND DIAPERS) ×3 IMPLANT
WATER STERILE IRR 1000ML POUR (IV SOLUTION) ×3 IMPLANT

## 2017-12-07 NOTE — Progress Notes (Signed)
Spoke with lidzen in regards to pts ordered scans and current status.  lindzen okay to wait for the 24/hr post tpa scans and Okay with pt lethargy post op, WCTM

## 2017-12-07 NOTE — Anesthesia Procedure Notes (Signed)
Procedure Name: Intubation Date/Time: 12/07/2017 1:56 AM Performed by: Claris Che, CRNA Pre-anesthesia Checklist: Patient identified, Emergency Drugs available, Suction available and Patient being monitored Patient Re-evaluated:Patient Re-evaluated prior to induction Oxygen Delivery Method: Circle System Utilized Preoxygenation: Pre-oxygenation with 100% oxygen Induction Type: IV induction Ventilation: Mask ventilation without difficulty Laryngoscope Size: Mac and 3 Grade View: Grade II Tube type: Oral Tube size: 8.0 mm Number of attempts: 1 Airway Equipment and Method: Stylet and Oral airway Placement Confirmation: ETT inserted through vocal cords under direct vision,  positive ETCO2 and breath sounds checked- equal and bilateral Secured at: 20 cm Tube secured with: Tape Dental Injury: Teeth and Oropharynx as per pre-operative assessment

## 2017-12-07 NOTE — Anesthesia Procedure Notes (Deleted)
Performed by: Samanvi Cuccia J, CRNA ° ° ° ° ° ° °

## 2017-12-07 NOTE — Progress Notes (Addendum)
STROKE TEAM PROGRESS NOTE   HISTORY OF PRESENT ILLNESS (per record) Brittany [name] is an 57 y.o. female with a PMH of HTN, HLD, DM, Hx of prior CVA and tobacco abuse who presents to the ED as a code stroke with reports of acute onset Global Aphasia, last seen normal at approximately 09:15 this morning. CT Head code stroke reveals Acute Left Insula Infarct with suspected embolus in distal Left M2. No contraindications were identified in patients history and IV tPA was administered. IR was notified and patient family was briefed by Dr Laurence SlateAroor. Risks and benefits of IR procedure was explained and family wants to proceed.  Date last known well: Date: 12/06/2017 Time last known well: Time: 09:15 tPA Given: Yes Saturday 12/06/17 at 1400  Modified Rankin: Rankin Score=1 NIH Stroke Scale: 10     SUBJECTIVE (INTERVAL HISTORY) Her husband and son at bedside, she is moving all her extremities and has strong pulses bilaterally in the lower extremities s/p emergent surgery for arterial occlusion right leg.    OBJECTIVE Temp:  [97.2 F (36.2 C)-99.1 F (37.3 C)] 98.4 F (36.9 C) (03/31 0800) Pulse Rate:  [67-102] 89 (03/31 0800) Cardiac Rhythm: Normal sinus rhythm (03/31 0800) Resp:  [10-26] 17 (03/31 0800) BP: (93-144)/(49-100) 111/61 (03/31 0800) SpO2:  [92 %-100 %] 92 % (03/31 0800) Arterial Line BP: (85-169)/(49-68) 121/62 (03/31 0800) Weight:  [134 lb 4.2 oz (60.9 kg)] 134 lb 4.2 oz (60.9 kg) (03/30 1300)  CBC:  Recent Labs  Lab 12/06/17 1300 12/06/17 1333 12/07/17 0443  WBC 9.0  --  16.9*  NEUTROABS 5.4  --  15.9*  HGB 15.4* 15.0 11.7*  HCT 44.0 44.0 34.0*  MCV 88.2  --  88.8  PLT 243  --  176    Basic Metabolic Panel:  Recent Labs  Lab 12/06/17 1300 12/06/17 1333 12/07/17 0443  NA 133* 136 136  K 3.9 4.0 3.7  CL 102 103 108  CO2 19*  --  19*  GLUCOSE 410* 420* 345*  BUN 10 14 7   CREATININE 0.60 0.40* 0.51  CALCIUM 9.2  --  7.9*    Lipid Panel:     Component  Value Date/Time   CHOL 161 12/07/2017 0443   CHOL 156 05/17/2017 1119   TRIG 75 12/07/2017 0443   HDL 40 (L) 12/07/2017 0443   HDL 55 05/17/2017 1119   CHOLHDL 4.0 12/07/2017 0443   VLDL 15 12/07/2017 0443   LDLCALC 106 (H) 12/07/2017 0443   LDLCALC 81 05/17/2017 1119   HgbA1c:  Lab Results  Component Value Date   HGBA1C 11.5 (H) 12/07/2017   Urine Drug Screen:     Component Value Date/Time   LABOPIA NONE DETECTED 12/06/2017 1840   COCAINSCRNUR NONE DETECTED 12/06/2017 1840   LABBENZ NONE DETECTED 12/06/2017 1840   AMPHETMU NONE DETECTED 12/06/2017 1840   THCU NONE DETECTED 12/06/2017 1840   LABBARB NONE DETECTED 12/06/2017 1840    Alcohol Level     Component Value Date/Time   ETH <10 12/06/2017 1330    IMAGING  Ct Angio Head W Or Wo Contrast Ct Angio Neck W Or Wo Contrast 12/06/2017 IMPRESSION:  1. Mid to distal left M2 branch occlusion.  2. No large vessel occlusion or significant proximal stenosis in the head or neck.    Ct Head Code Stroke Wo Contrast 12/06/2017 IMPRESSION:  1. 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 No hemorrhage.  2. ASPECTS is 9.  3. Suspected embolus in a mid to  distal left M2 branch vessel.    CT Head Wo Contrast 12/07/2017 IMPRESSION: Moderate left frontal parietal acute infarct correlating with M2 occlusion seen yesterday. Contrast and or hemorrhage in the left sylvian fissure has diminished from intraprocedural head CT.   CT Head Without Contrast - pending   MRI Brain Wo Contrast - pending   Vascular US ABI - pending    Transthoracic Echocardiogram - pending 00/00/00   Cerebral Angiogram - Dr Corliss Skains 12/06/2017 S/P  Lt common carotid arteriogram,followed by near complete revascularization of occluded distal M2 branch of Lt MCA with x1 pass with solitaire Fr84mm x 40 mmretriver  and 4.5 mg of IA integrelin achieving a TICI 2b reperfusion.  Physical exam: Exam: Gen: NAD, snoring but easily arousable               CV: RRR, no MRG. No Carotid Bruits. No peripheral edema, warm, nontender, normal dorsalis pedis pulses Eyes: Conjunctivae clear without exudates or hemorrhage    Neuro: Detailed Neurologic Exam  Speech:    Speech is aphasic Cognition:    The patient is aphasic and not following commands Cranial Nerves:    The pupils are equal, round, and reactive to light.  Extraocular movements are intact.  The face is symmetric. The palate elevates in the midline. Hearing intact to voice. Voice is normal. The tongue has normal motion without fasciculations.   Coordination:    No dysmetria noted  Motor Observation:    No asymmetry, no atrophy, and no involuntary movements noted.    Strength and sensation: Difficult exam due to aphasia, withdraws in all extremities.        PHYSICAL EXAM Vitals:   12/07/17 0600 12/07/17 0630 12/07/17 0700 12/07/17 0800  BP: (!) 112/57 (!) 119/58 (!) 105/54 111/61  Pulse: 88 92 92 89  Resp: 17 (!) 21 19 17   Temp:    98.4 F (36.9 C)  TempSrc:    Oral  SpO2: 100% 96% 97% 92%  Weight:          ASSESSMENT/PLAN Brittany [name] is a 57 y.o. female with history of HTN, HLD, DM, Hx of prior CVA and tobacco abuse  presenting with global aphasia. tPA Given: Yes Saturday 12/06/17 at 1400 Mechanical thrombectomy M2 branch of Left MCA -> TICI 2b reperfusion.  Stroke:  Moderate left frontal parietal acute infarct - embolic - source unknown.  Resultant  Aphasia, hemiparesis  CT head - Moderate left frontal parietal acute infarct - Contrast and or hemorrhage in the left sylvian fissure  MRI head - pending  MRA head - not performed  CTA H&N - Mid to distal left M2 branch occlusion.   Carotid Doppler - CTA neck  UDS - negative  2D Echo - pending  LDL - 81  HgbA1c - 11.5  VTE prophylaxis - SCDs  Check puncture sites for bleeding or hematomas.  Bleeding precautions  Fall precautions  Diet NPO time specified  aspirin 325 mg daily prior  to admission, now on No antithrombotic S/P tPA  Patient counseled to be compliant with her antithrombotic medications  Ongoing aggressive stroke risk factor management  Therapy recommendations:  pending  Disposition:  Pending  Hypertension  Stable  Permissive hypertension (OK if < 220/120) but gradually normalize in 5-7 days . Long-term BP goal normotensive  Hyperlipidemia  Lipid lowering medication PTA:  Lipitor 40 mg daily  LDL 81, goal < 70  Current lipid lowering medication: None - NPO ( consider Lipitor 80 mg daily  when taking POs)  Continue statin at discharge  Diabetes  HgbA1c 11.5, goal < 7.0  Uncontrolled  Consider diabetic teaching nurse consult if aphasia can be overcome.  Other Stroke Risk Factors  Cigarette smoker - advised to stop smoking  ETOH use, advised to drink no more than 1 drink per day.  Hx stroke/TIA   Other Active Problems  Mild anemia  Leukocytosis -16.9 - afebrile - monitor - recheck in a.m. - check U/A - consider CXR  S/p revascularization right leg today normal with good dorsalis pedis pulses, follow   Plan / Recommendations   Stroke workup: awaiting - 2D echo ; head CT ; MRI ; lower ext ABIs  Consider TEE / loop  Therapy Follow Up: pending  Disposition: pending  Antiplatelet / Anticoagulation: Resume antiplatelet therapy based on follow-up imaging.  Statin: pending - NPO (consider Lipitor 80 mg daily when taking POs)  MD Follow Up: pending  Other: pending  Further risk factor modification per primary care MD: Follow Up 2 weeks  Smoking cessation  Control glucose   Personally examined patient and images, and have participated in and made any corrections needed to history, physical, neuro exam,assessment and plan as stated above.  I have personally obtained the history, evaluated lab date, reviewed imaging studies and agree with radiology interpretations.    Naomie Dean, MD Stroke Neurology  To contact  Stroke Continuity provider, please refer to [email or web address]. After hours, contact General Neurology

## 2017-12-07 NOTE — Progress Notes (Signed)
vasc surg paged, they are en route,  pts foot continue to lose color, pulses absent.  Will postpone head CT for now

## 2017-12-07 NOTE — Anesthesia Postprocedure Evaluation (Signed)
Anesthesia Post Note  Patient: Jenne CampusDenise W Osier  Procedure(s) Performed: THROMBECTOMY RIGHT FEMORAL ARTERY (Right Leg Lower) RIGHT LOWER EXTREMITY ANGIOGRAM (Right )     Patient location during evaluation: PACU Anesthesia Type: General Level of consciousness: awake and alert Pain management: pain level controlled Vital Signs Assessment: post-procedure vital signs reviewed and stable Respiratory status: spontaneous breathing, nonlabored ventilation, respiratory function stable and patient connected to nasal cannula oxygen Cardiovascular status: blood pressure returned to baseline and stable Postop Assessment: no apparent nausea or vomiting Anesthetic complications: no    Last Vitals:  Vitals:   12/07/17 0415 12/07/17 0452  BP: 107/69   Pulse: 90 88  Resp: 17 14  Temp: 36.6 C   SpO2: 100% 98%    Last Pain:  Vitals:   12/06/17 2330  TempSrc: Oral                 Shelton SilvasKevin D Remy Voiles

## 2017-12-07 NOTE — Progress Notes (Signed)
  Progress Note    12/07/2017 12:41 PM Day of Surgery  Subjective: Patient is somnolent on my exam today with minimal interaction  Vitals:   12/07/17 1130 12/07/17 1200  BP: (!) 117/56 (!) 118/56  Pulse: 98 98  Resp: (!) 21 19  Temp:  98.6 F (37 C)  SpO2: 93% 94%    Physical Exam: Somnolent Nonlabored respirations Right groin incision clean dry and intact There is a palpable right dorsalis pedis pulse and the foot appears well perfused at this time Compartments of right lower extremity are soft She is not moving her right lower extremity at this time  CBC    Component Value Date/Time   WBC 16.9 (H) 12/07/2017 0443   RBC 3.83 (L) 12/07/2017 0443   HGB 11.7 (L) 12/07/2017 0443   HGB 13.2 08/28/2016 1520   HCT 34.0 (L) 12/07/2017 0443   HCT 40.1 08/28/2016 1520   PLT 176 12/07/2017 0443   PLT 245 08/28/2016 1520   MCV 88.8 12/07/2017 0443   MCV 89.2 07/30/2017 1740   MCV 91 08/28/2016 1520   MCH 30.5 12/07/2017 0443   MCHC 34.4 12/07/2017 0443   RDW 11.9 12/07/2017 0443   RDW 13.3 08/28/2016 1520   LYMPHSABS 0.8 12/07/2017 0443   LYMPHSABS 2.6 08/28/2016 1520   MONOABS 0.2 12/07/2017 0443   EOSABS 0.0 12/07/2017 0443   EOSABS 0.3 08/28/2016 1520   BASOSABS 0.0 12/07/2017 0443   BASOSABS 0.0 08/28/2016 1520    BMET    Component Value Date/Time   NA 136 12/07/2017 0443   NA 136 07/30/2017 1803   K 3.7 12/07/2017 0443   CL 108 12/07/2017 0443   CO2 19 (L) 12/07/2017 0443   GLUCOSE 345 (H) 12/07/2017 0443   BUN 7 12/07/2017 0443   BUN 8 07/30/2017 1803   CREATININE 0.51 12/07/2017 0443   CREATININE 0.59 07/13/2016 1112   CALCIUM 7.9 (L) 12/07/2017 0443   GFRNONAA >60 12/07/2017 0443   GFRNONAA >89 07/15/2014 0947   GFRAA >60 12/07/2017 0443   GFRAA >89 07/15/2014 0947    INR    Component Value Date/Time   INR 0.90 12/06/2017 1300     Intake/Output Summary (Last 24 hours) at 12/07/2017 1241 Last data filed at 12/07/2017 1200 Gross per 24 hour    Intake 4837.76 ml  Output 3550 ml  Net 1287.76 ml     Assessment:  57 y.o. female is s/p embolectomy right lower extremity with right lower extremity angiogram for acute limb ischemia following neuro salvage procedure.  There is now palpable dorsalis pedis pulse.  From a vascular standpoint she would just need antiplatelet medications and no further anticoagulation.  She does not need fasciotomies as her compartments are soft.  We will continue to follow neuro exam although her deficits appear to be related to her stroke and not secondary to ischemia   Brynnley Dayrit C. Randie Heinzain, MD [facility] [place] Office: [phone] Pager: [phone]  12/07/2017 12:41 PM

## 2017-12-07 NOTE — Anesthesia Preprocedure Evaluation (Signed)
Anesthesia Evaluation  Patient identified by MRN, date of birth, ID band Patient confused    Reviewed: Allergy & Precautions, NPO status , Patient's Chart, lab work & pertinent test results, Unable to perform ROS - Chart review onlyPreop documentation limited or incomplete due to emergent nature of procedure.  Airway Mallampati: II  TM Distance: >3 FB Neck ROM: Full    Dental  (+) Teeth Intact, Dental Advisory Given, Poor Dentition   Pulmonary sleep apnea , COPD,  COPD inhaler, Current Smoker,    breath sounds clear to auscultation       Cardiovascular hypertension, Pt. on medications + Peripheral Vascular Disease   Rhythm:Regular Rate:Normal     Neuro/Psych PSYCHIATRIC DISORDERS Depression  Neuromuscular disease    GI/Hepatic Neg liver ROS, GERD  ,  Endo/Other  diabetes, Type 2, Oral Hypoglycemic Agents  Renal/GU negative Renal ROS     Musculoskeletal  (+) Fibromyalgia -  Abdominal Normal abdominal exam  (+)   Peds  Hematology negative hematology ROS (+)   Anesthesia Other Findings   Reproductive/Obstetrics                             Anesthesia Physical  Anesthesia Plan  ASA: III and emergent  Anesthesia Plan: General   Post-op Pain Management:    Induction: Intravenous  PONV Risk Score and Plan: 3 and Ondansetron and Treatment may vary due to age or medical condition  Airway Management Planned: Oral ETT  Additional Equipment: Arterial line  Intra-op Plan:   Post-operative Plan:   Informed Consent: I have reviewed the patients History and Physical, chart, labs and discussed the procedure including the risks, benefits and alternatives for the proposed anesthesia with the patient or authorized representative who has indicated his/her understanding and acceptance.   Dental advisory given  Plan Discussed with: CRNA  Anesthesia Plan Comments:         Anesthesia Quick  Evaluation

## 2017-12-07 NOTE — Progress Notes (Signed)
Pt left room for OR just after 0130

## 2017-12-07 NOTE — Progress Notes (Signed)
Notified Dr. Otelia LimesLindzen of SBPs below 110. 1L bolus NS ordered and maintenance fluids increased.

## 2017-12-07 NOTE — Transfer of Care (Signed)
Immediate Anesthesia Transfer of Care Note  Patient: Brittany [name]  Procedure(s) Performed: THROMBECTOMY RIGHT FEMORAL ARTERY (Right Leg Lower) RIGHT LOWER EXTREMITY ANGIOGRAM (Right )  Patient Location: PACU  Anesthesia Type:General  Level of Consciousness: sedated, drowsy, patient cooperative and responds to stimulation  Airway & Oxygen Therapy: Patient Spontanous Breathing and Patient connected to nasal cannula oxygen  Post-op Assessment: Report given to RN, Post -op Vital signs reviewed and stable and Patient moving all extremities X 4  Post vital signs: Reviewed and stable  Last Vitals:  Vitals Value Taken Time  BP 115/62 12/07/2017  3:47 AM  Temp    Pulse 100 12/07/2017  3:51 AM  Resp 22 12/07/2017  3:51 AM  SpO2 100 % 12/07/2017  3:51 AM  Vitals shown include unvalidated device data.  Last Pain:  Vitals:   12/06/17 2330  TempSrc: Oral         Complications: No apparent anesthesia complications

## 2017-12-07 NOTE — Progress Notes (Signed)
SLP Cancellation Note  Patient Details Name: Brittany [name] MRN: [id number] DOB: 1961/04/27   Cancelled treatment:       Reason Eval/Treat Not Completed: Patient not medically ready. Pt to lie flat in bed. Will follow up.  Brittany [name], [place], CCC-SLP Speech-Language Pathologist [phone]   Brittany [name] 12/07/2017, 10:32 AM

## 2017-12-07 NOTE — Progress Notes (Signed)
Referring Physician(s): Code Stroke  Supervising Physician: Julieanne Cotton  Patient Status:  [facility] - In-pt  Chief Complaint:  Stroke  Subjective:  Brittany [name] is lying in bed. She opens eyes to voice. She answers questions but not appropriately due to expressive aphasia.  Events over night = Patient lost right pedal pulse and was taken to OR by vascular. Sheath removed and RLE thrombectomy/embolectomy was done by Dr. Randie Heinz.  Allergies: Patient has no known allergies.  Medications: Prior to Admission medications   Medication Sig Start Date End Date Taking? Authorizing Provider  albuterol (PROVENTIL) (2.5 MG/3ML) 0.083% nebulizer solution Take 3 mLs (2.5 mg total) by nebulization every 6 (six) hours as needed for wheezing or shortness of breath. 07/13/16   [name], MD  Albuterol Sulfate (PROAIR RESPICLICK) 108 (90 Base) MCG/ACT AEPB Inhale 2 puffs into the lungs every 4 (four) hours as needed. 07/13/16   [name], MD  aspirin 325 MG tablet Take 325 mg by mouth daily.    [provider]  atorvastatin (LIPITOR) 40 MG tablet Take 1 tablet (40 mg total) by mouth daily. 05/17/17   [name], MD  B Complex-Biotin-FA (MULTI-B COMPLEX) CAPS Take one a day ,B complex  multivitamin over the counter or use prenatal vitamin. 08/28/16   Dohmeier, Porfirio Mylar, MD  BD PEN NEEDLE NANO U/F 32G X 4 MM MISC USE WITH VICTOZA PEN 05/23/17   [name], MD  Canagliflozin-Metformin HCl ER (INVOKAMET XR) 50-1000 MG TB24 Take 2 tablets by mouth daily. 05/17/17   [name], MD  cholecalciferol (VITAMIN D) 1000 UNITS tablet Take 1,000 Units by mouth daily.    [provider]  estradiol (ESTRACE) 0.1 MG/GM vaginal cream Apply to perineum at night x 2 weeks, then every other night. 07/30/17   [name], MD  liraglutide (VICTOZA) 18 MG/3ML SOPN Inject 0.3 mLs (1.8 mg total) into the skin daily. 05/17/17   [name], MD  losartan (COZAAR) 50 MG tablet Take 1 tablet (50 mg total) by mouth  daily. 05/17/17   [name], MD  metFORMIN (GLUCOPHAGE) 500 MG tablet Take by mouth 4 (four) times daily.    [provider]  primidone (MYSOLINE) 250 MG tablet Take 1 tablet (250 mg total) by mouth at bedtime. 08/28/16   Dohmeier, Porfirio Mylar, MD  sertraline (ZOLOFT) 100 MG tablet Take 2 tablets (200 mg total) by mouth at bedtime. 05/17/17   [name], MD  sitaGLIPtin (JANUVIA) 100 MG tablet Take 1 tablet (100 mg total) by mouth daily. 05/17/17   [name], MD     Vital Signs: BP (!) 118/56 (BP Location: Right Arm)   Pulse 98   Temp 98.6 F (37 C) (Oral)   Resp 19   Wt 134 lb 4.2 oz (60.9 kg)   SpO2 94%   BMI 23.78 kg/m   Physical Exam Opens eyes to voice. Answers only "Ok" and "it's cold" to all my questions. Right arm weakness Will not follow most commands appropriately. She did follow my finger with her eyes, EOMI. Right groin incision clean, dermabond in place.  Imaging: Ct Angio Head W Or Wo Contrast  Result Date: 12/06/2017 CLINICAL DATA:  Expressive aphasia.  Right facial droop. EXAM: CT ANGIOGRAPHY HEAD AND NECK TECHNIQUE: Multidetector CT imaging of the head and neck was performed using the standard protocol during bolus administration of intravenous contrast. Multiplanar CT image reconstructions and MIPs were obtained to evaluate the vascular  anatomy. Carotid stenosis measurements (when applicable) are obtained utilizing NASCET criteria, using the distal internal carotid diameter as the denominator. CONTRAST:  50mL ISOVUE-370 IOPAMIDOL (ISOVUE-370) INJECTION 76% COMPARISON:  None. FINDINGS: CTA NECK FINDINGS Aortic arch: Standard 3 vessel aortic arch. Widely patent brachiocephalic and subclavian arteries. Right carotid system: Patent without evidence of stenosis or dissection. Minimal atherosclerotic plaque at the carotid bifurcation. Left carotid system: Patent without evidence of stenosis or dissection. Minimal calcified plaque at the carotid bifurcation. Vertebral  arteries: Patent and codominant without evidence of stenosis or dissection. Calcified nonstenotic plaque at the right vertebral artery origin. Skeleton: C4-C6 ACDF.  Advanced right facet arthrosis at C3-4. Other neck: No mass or enlarged lymph nodes. Upper chest: Clear lung apices. Review of the MIP images confirms the above findings CTA HEAD FINDINGS Anterior circulation: The internal carotid arteries are patent from skull base to carotid termini with minimal non-stenotic plaque on the left. M1 segments and MCA bifurcations are widely patent. There is occlusion of a mid to distal left M2 branch near the posterior insula. ACAs are patent without significant stenosis. No aneurysm or vascular malformation. Posterior circulation: The intracranial vertebral arteries are widely patent to the basilar. Patent PICA and SCA origins are identified bilaterally. The basilar artery is widely patent. There are left larger than right posterior communicating arteries. The PCAs are patent without evidence of significant stenosis. No aneurysm or vascular malformation. Venous sinuses: As permitted by contrast timing, patent. Anatomic variants: None. Delayed phase: Not performed. Review of the MIP images confirms the above findings IMPRESSION: 1. Mid to distal left M2 branch occlusion. 2. No large vessel occlusion or significant proximal stenosis in the head or neck. These results were communicated to Dr. Laurence SlateAroor at 1:48 pm on 12/06/2017 by text page via the [facility] messaging system. Electronically Signed   By: Sebastian AcheAllen  Grady M.D.   On: 12/06/2017 14:13   Ct Head Wo Contrast  Result Date: 12/07/2017 CLINICAL DATA:  Follow-up stroke. EXAM: CT HEAD WITHOUT CONTRAST TECHNIQUE: Contiguous axial images were obtained from the base of the skull through the vertex without intravenous contrast. COMPARISON:  Head CT and CTA from yesterday FINDINGS: Brain: Cytotoxic edema involving posterior left frontal and parietal lobes extending to the level of  the insula. The infarct correlates with the M2 occlusion by CTA. Small volume high-density within the left sylvian fissure, diminished from intra procedural head CT at time of catheter angiography. No new distribution infarct is seen. No hydrocephalus Vascular: No visible/central high-density vessel. Skull: No acute or aggressive finding Sinuses/Orbits: Negative IMPRESSION: Moderate left frontal parietal acute infarct correlating with M2 occlusion seen yesterday. Contrast and or hemorrhage in the left sylvian fissure has diminished from intraprocedural head CT. Electronically Signed   By: Marnee SpringJonathon  Watts M.D.   On: 12/07/2017 08:11   Ct Angio Neck W Or Wo Contrast  Result Date: 12/06/2017 CLINICAL DATA:  Expressive aphasia.  Right facial droop. EXAM: CT ANGIOGRAPHY HEAD AND NECK TECHNIQUE: Multidetector CT imaging of the head and neck was performed using the standard protocol during bolus administration of intravenous contrast. Multiplanar CT image reconstructions and MIPs were obtained to evaluate the vascular anatomy. Carotid stenosis measurements (when applicable) are obtained utilizing NASCET criteria, using the distal internal carotid diameter as the denominator. CONTRAST:  50mL ISOVUE-370 IOPAMIDOL (ISOVUE-370) INJECTION 76% COMPARISON:  None. FINDINGS: CTA NECK FINDINGS Aortic arch: Standard 3 vessel aortic arch. Widely patent brachiocephalic and subclavian arteries. Right carotid system: Patent without evidence of stenosis or dissection. Minimal atherosclerotic  plaque at the carotid bifurcation. Left carotid system: Patent without evidence of stenosis or dissection. Minimal calcified plaque at the carotid bifurcation. Vertebral arteries: Patent and codominant without evidence of stenosis or dissection. Calcified nonstenotic plaque at the right vertebral artery origin. Skeleton: C4-C6 ACDF.  Advanced right facet arthrosis at C3-4. Other neck: No mass or enlarged lymph nodes. Upper chest: Clear lung  apices. Review of the MIP images confirms the above findings CTA HEAD FINDINGS Anterior circulation: The internal carotid arteries are patent from skull base to carotid termini with minimal non-stenotic plaque on the left. M1 segments and MCA bifurcations are widely patent. There is occlusion of a mid to distal left M2 branch near the posterior insula. ACAs are patent without significant stenosis. No aneurysm or vascular malformation. Posterior circulation: The intracranial vertebral arteries are widely patent to the basilar. Patent PICA and SCA origins are identified bilaterally. The basilar artery is widely patent. There are left larger than right posterior communicating arteries. The PCAs are patent without evidence of significant stenosis. No aneurysm or vascular malformation. Venous sinuses: As permitted by contrast timing, patent. Anatomic variants: None. Delayed phase: Not performed. Review of the MIP images confirms the above findings IMPRESSION: 1. Mid to distal left M2 branch occlusion. 2. No large vessel occlusion or significant proximal stenosis in the head or neck. These results were communicated to Dr. Laurence Slate at 1:48 pm on 12/06/2017 by text page via the [facility] messaging system. Electronically Signed   By: Sebastian Ache M.D.   On: 12/06/2017 14:13   Ct Head Code Stroke Wo Contrast  Result Date: 12/06/2017 CLINICAL DATA:  Code stroke. Expressive aphasia and right facial droop. EXAM: CT HEAD WITHOUT CONTRAST TECHNIQUE: Contiguous axial images were obtained from the base of the skull through the vertex without intravenous contrast. COMPARISON:  Brain MRI 09/11/2016 FINDINGS: Brain: There is focal hypoattenuation involving the posterior left insula likely reflecting an acute infarct. No acute cortical infarction is identified elsewhere, and there is no evidence of acute basal ganglia infarct. No intracranial hemorrhage, mass, midline shift, or extra-axial fluid collection is identified. There may be a tiny  chronic lacunar infarct in the left lentiform nucleus versus a dilated perivascular space. The ventricles and sulci are normal. Vascular: Calcified atherosclerosis at the skull base. Hyperattenuation of a mid to distal M2 branch vessel in the left sylvian fissure. Skull: No fracture focal osseous lesion. Sinuses/Orbits: Mild right posterior ethmoid air cell mucosal thickening. Clear mastoid air cells. Unremarkable orbits. Other: None. ASPECTS [facility] Stroke Program Early CT Score) - Ganglionic level infarction (caudate, lentiform nuclei, internal capsule, insula, M1-M3 cortex): 6 - Supraganglionic infarction (M4-M6 cortex): 3 Total score (0-10 with 10 being normal): 9 IMPRESSION: 1. 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 No hemorrhage. 2. ASPECTS is 9. 3. Suspected embolus in a mid to distal left M2 branch vessel. These results were communicated to Dr. Laurence Slate at 1:48 pm on 12/06/2017 by text page via the [facility] messaging system. Electronically Signed   By: Sebastian Ache M.D.   On: 12/06/2017 13:49    Labs:  CBC: Recent Labs    07/30/17 1740 12/06/17 1300 12/06/17 1333 12/07/17 0443  WBC 8.6 9.0  --  16.9*  HGB 15.4 15.4* 15.0 11.7*  HCT 44.8 44.0 44.0 34.0*  PLT  --  243  --  176    COAGS: Recent Labs    12/06/17 1300  INR 0.90  APTT 25    BMP: Recent Labs    05/17/17 1119  07/30/17 1803 12/06/17 1300 12/06/17 1333 12/07/17 0443  NA 138 136 133* 136 136  K 4.2 3.7 3.9 4.0 3.7  CL 98 99 102 103 108  CO2 22 21 19*  --  19*  GLUCOSE 304* 353* 410* 420* 345*  BUN 12 8 10 14 7   CALCIUM 9.6 9.4 9.2  --  7.9*  CREATININE 0.57 0.58 0.60 0.40* 0.51  GFRNONAA 103 102 >60  --  >60  GFRAA 118 117 >60  --  >60    LIVER FUNCTION TESTS: Recent Labs    05/17/17 1119 07/30/17 1803 12/06/17 1300  BILITOT 0.5 0.4 0.5  AST 16 14 19   ALT 13 13 17   ALKPHOS 150* 154* 154*  PROT 6.9 7.0 6.8  ALBUMIN 4.4 4.3 3.7    Assessment and Plan:  Mid to distal left M2 branch  occlusion  S/P  Lt common carotid arteriogram followee by near complete revascularization of occluded distal M2 branch of Lt MCA with x1 pass with solitaire Fr75mm x 40 mmretriver  And 4.5 mg of IA integrelin Achieving a TICI 2b reperfusion by Dr. Corliss Skains.  Sheath caused thrombosis of the right LE= S/PRight lower extremity thromboembolectomy, Right lower extremity angiogram, Repair of right profunda femoris arteriotomy and femoral vein venotomy by Dr. Randie Heinz.  Continue current care.  Electronically Signed: Gwynneth Macleod, PA-C 12/07/2017, 12:59 PM   I spent a total of 15 Minutes at the the patient's bedside AND on the patient's hospital floor or unit, greater than 50% of which was counseling/coordinating care for follow up after cerebral revasc.

## 2017-12-07 NOTE — Op Note (Signed)
Patient name: Brittany [name] MRN: [id number] DOB: 21-Oct-1960 Sex: female  12/07/2017 Pre-operative Diagnosis: acute right lower extremity ischemia Post-operative diagnosis:  Same Surgeon:  [name] C. Randie Heinz, MD Assistant: OR nurses Procedure Performed: 1.  Right lower extremity thromboembolectomy 2.  Right lower extremity angiogram 3.  Repair of right profunda femoris arteriotomy and femoral vein venotomy  Indications: 57 year old female presented with a left MCA distribution stroke was given TPA and then underwent endovascular salvage the day prior to this procedure.  Later that night she was noted to stop moving her right lower extremity pulses could not be found she appeared to have ischemia and vascular surgery was consulted.  The foot at that time.  Cadaveric she had profound ischemia although neurologic exam somewhat difficult to follow given her recent stroke.  She is indicated for exploration of the right common femoral artery with embolectomy and angiogram is indicated.  Findings: The existing sheath was through a side branch of the femoral vein and into the profunda femoris artery.  The profunda was equally the size of the superficial femoral artery although both were quite diminutive and that it profunda also branch immediately.  After embolectomy we had very strong inflow and then good signals at the anterior tibial and posterior tibial at the level of the ankle.  Angiogram did not demonstrate any flow abnormalities in the iliacs or common femoral artery to suggest etiology of thrombosis leaving the likelihood of having large sheath and a very small profunda femoris artery causing occlusion as the cause.  I completion we had flow through the femoral vein where the side branch was repaired and given the short time of ischemia fasciotomies were not performed.  There was a week signal in the dorsalis pedis distally on the foot and the foot had begun to appear more pink and had cap refill at  completion.   Procedure:  The patient was identified in the holding area and taken to the operating room where she was placed supine on the operative table and general anesthesia was induced.  She was sterilely prepped and draped in the bilateral groins and right lower extremity usual fashion she was given antibiotics and timeout was called.  We began with longitudinal incision overlying the site of the existing sheath.  I dissected down onto the inguinal ligament.  Then dissected out the common femoral artery and initially I could not find where the sheath entered.  Then placed a vessel loop up around the external iliac artery and then down around the SFA.  There is a lateral circumflex branch that was also looped.  I then identified the profunda femoris branch immediately could tell the sheath was then through this but it also appeared to be through the vein.  After further dissection identified that it was through the side of the femoral vein with the crossing profunda branch and into the profunda femoris artery.  I placed Vesseloops around the profunda artery.  I then placed 3-0 silk ties around the side branch profunda and tied this off.  Continued to have minimal venous bleeding and then removed the sheath.  There is minimal backbleeding from the profunda that we could control easily but there was a large hole in the side of the femoral vein which was repaired with 5-0 Prolene sutures x2 in approximately 100 cc of blood was lost at this time.  After getting control that we then placed a 6 oh repair stitch in the profunda itself.  The patient was  then heparinized with 6000 units of heparin.  After circulating for treatment as a transverse arteriotomy was made in the common femoral artery at the level of the profunda.  I encountered significant acute appearing thrombus.  I passed a 4 Fogarty centrally and removed significant thrombus until I had a clean pass and I had very strong inflow and then the external  iliac artery was clamped.  I then placed a 3 Fogarty down the profunda as well as the lateral circumflex branches and return clot and had good backbleeding and these were clamped as well.  I then made multiple passes down the SFA where I returned a very large amount of thrombus.  When I no longer had any thrombus I then irrigated and I got some backbleeding from the SFA and this was clamped.  I then repaired the arteriotomy with 5-0 Prolene suture and flushed prior to completing the suture line.  Upon completion I had a palpable popliteal pulse.  I had a weak signal at the anterior tibial and posterior tibial arteries.  I then cannulated the common femoral artery with a micropuncture sheath and performed right lower extremity angiogram.  In the iliacs and common femoral artery I did not identify any flow abnormalities to suggest there was dissection.  Flow below the knee was 3 vessels approximately distally there did appear to be some spasm but the foot did fill.  With this I elected to terminate the procedure.  Fasciotomies were not performed as this was a short period of ischemia and the femoral vein did have flow.  We irrigated the wound and closed in layers with Vicryl and Monocryl.  Dermabond was placed level the skin.  After this we did confirm a monophasic signal in the dorsalis pedis pretty distally on the foot and the foot did begin to appear perfused and had capillary refill.  She was allowed away from anesthesia having tolerated procedure well without immediate comp occasion.  All counts were correct at completion.  Blood loss 400 cc.   Nesanel Aguila C. Randie Heinzain, MD [facility] Office: [phone] Pager: [phone]

## 2017-12-07 NOTE — Progress Notes (Signed)
Pt taken for STAT CT as ordered by Dr. Laurence SlateAroor during shift change. Assessed when back in room. NIH from 6 to 24 based on night shift documentation. Pt is arousable, but will only move RUE to command and very weak. Dr. Laurence SlateAroor notified. States CT does "not look bad." Will update Dr. Lucia GaskinsAhern during morning rounds. Husband at bedside aware.

## 2017-12-07 NOTE — Progress Notes (Signed)
Pt is lethargic from OR, will not follow commands on RUE and BLE for NIHSS, only the LUE. WCTM

## 2017-12-07 NOTE — Consult Note (Signed)
Hospital Consult    Reason for Consult:  Acute right lower extremity ischemia Referring Physician:  Dr. Otelia LimesLindzen MRN #:  [id number]  History of Present Illness: This is a 57 y.o. female without significant peripheral vascular history but risk factors of hypercholesterolemia.  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  Per the nurse patient had palpable dorsalis pedis artery up until about 10:00 at which time Doppler had to be used and no signals could be found.  Foot has become progressively ischemic.  Patient is a phasic and unable to answer questions regarding sensation but she has not been moving the leg since about the same time as ischemia was noted.  Vascular consulted for further evaluation.  Of note she was given TPA earlier today and no heparin or antiplatelet agents have been given since.  Sheath is still present in the right groin but has had difficulty with transducing.  Past Medical History:  Diagnosis Date  . Depression   . Diabetes mellitus without complication (HCC)   . Fibromyalgia   . High cholesterol     Past Surgical History:  Procedure Laterality Date  . CHOLECYSTECTOMY    . SPINE SURGERY    . TONSILLECTOMY      No Known Allergies  Prior to Admission medications   Medication Sig Start Date End Date Taking? Authorizing Provider  albuterol (PROVENTIL) (2.5 MG/3ML) 0.083% nebulizer solution Take 3 mLs (2.5 mg total) by nebulization every 6 (six) hours as needed for wheezing or shortness of breath. 07/13/16   Sherren MochaShaw, [name], MD  Albuterol Sulfate (PROAIR RESPICLICK) 108 (90 Base) MCG/ACT AEPB Inhale 2 puffs into the lungs every 4 (four) hours as needed. 07/13/16   Sherren MochaShaw, [name], MD  aspirin 325 MG tablet Take 325 mg by mouth daily.    [provider]  atorvastatin (LIPITOR) 40 MG tablet Take 1 tablet (40 mg total) by mouth daily. 05/17/17   Sherren MochaShaw, [name], MD  B Complex-Biotin-FA (MULTI-B  COMPLEX) CAPS Take one a day ,B complex  multivitamin over the counter or use prenatal vitamin. 08/28/16   Dohmeier, Porfirio Mylararmen, MD  BD PEN NEEDLE NANO U/F 32G X 4 MM MISC USE WITH VICTOZA PEN 05/23/17   Sherren MochaShaw, [name], MD  Canagliflozin-Metformin HCl ER (INVOKAMET XR) 50-1000 MG TB24 Take 2 tablets by mouth daily. 05/17/17   Sherren MochaShaw, [name], MD  cholecalciferol (VITAMIN D) 1000 UNITS tablet Take 1,000 Units by mouth daily.    [provider]  estradiol (ESTRACE) 0.1 MG/GM vaginal cream Apply to perineum at night x 2 weeks, then every other night. 07/30/17   Sherren MochaShaw, [name], MD  liraglutide (VICTOZA) 18 MG/3ML SOPN Inject 0.3 mLs (1.8 mg total) into the skin daily. 05/17/17   Sherren MochaShaw, [name], MD  losartan (COZAAR) 50 MG tablet Take 1 tablet (50 mg total) by mouth daily. 05/17/17   Sherren MochaShaw, [name], MD  metFORMIN (GLUCOPHAGE) 500 MG tablet Take by mouth 4 (four) times daily.    [provider]  primidone (MYSOLINE) 250 MG tablet Take 1 tablet (250 mg total) by mouth at bedtime. 08/28/16   Dohmeier, Porfirio Mylararmen, MD  sertraline (ZOLOFT) 100 MG tablet Take 2 tablets (200 mg total) by mouth at bedtime. 05/17/17   Sherren MochaShaw, [name], MD  sitaGLIPtin (JANUVIA) 100 MG tablet Take 1 tablet (100 mg total) by mouth daily. 05/17/17   Sherren MochaShaw, [name], MD    Social History  Socioeconomic History  . Marital status: Married    Spouse name: [name]  . Number of children: 2  . Years of education: 92  . Highest education level: Not on file  Occupational History  . Not on file  Social Needs  . Financial resource strain: Not on file  . Food insecurity:    Worry: Not on file    Inability: Not on file  . Transportation needs:    Medical: Not on file    Non-medical: Not on file  Tobacco Use  . Smoking status: Current Every Day Smoker    Packs/day: 1.00    Years: 2.00    Pack years: 2.00    Types: Cigarettes  . Smokeless tobacco: Never Used  Substance and Sexual Activity  . Alcohol use: Yes    Comment: rarely  . Drug use: No  .  Sexual activity: Never  Lifestyle  . Physical activity:    Days per week: Not on file    Minutes per session: Not on file  . Stress: Not on file  Relationships  . Social connections:    Talks on phone: Not on file    Gets together: Not on file    Attends religious service: Not on file    Active member of club or organization: Not on file    Attends meetings of clubs or organizations: Not on file    Relationship status: Not on file  . Intimate partner violence:    Fear of current or ex partner: Not on file    Emotionally abused: Not on file    Physically abused: Not on file    Forced sexual activity: Not on file  Other Topics Concern  . Not on file  Social History Narrative   Patient is married ([name]) and lives at home with her family   Patient has two children.   Patient works at [employer].   Patient has a college education.   Patient is right-handed.   Patient drinks 3 cups of coffee M-F.        Family History  Problem Relation Age of Onset  . Cancer Mother   . Diabetes Mother   . Heart disease Mother   . Parkinsonism Mother   . Diabetes Father   . Heart disease Father   . Dementia Father   . Cancer Maternal Grandmother 62       lung  . COPD Maternal Aunt     ROS: cannot obtain secondary to aphasia   Physical Examination  Vitals:   12/06/17 2345 12/07/17 0000  BP: 126/65 (!) 122/100  Pulse: 67 82  Resp: 15 14  Temp:    SpO2: 100% 100%   Body mass index is 23.78 kg/m.  General:  Yelling in distress HENT: WNL, normocephalic Pulmonary: normal non-labored breathing Cardiac: palpable pulses throughout the left lower extremity No signals in right foot Abdomen: soft, NT/ND, no masses Extremities: sheath in place right groin Right foot appears cadaveric, cannot get patient to move her right leg  Neurologic: Speech is inappropriate and incoherent.  She is moving her bilateral upper extremities as well as her left lower extremity but will not  move her right lower extremity at this time.  CBC    Component Value Date/Time   WBC 9.0 12/06/2017 1300   RBC 4.99 12/06/2017 1300   HGB 15.0 12/06/2017 1333   HGB 13.2 08/28/2016 1520   HCT 44.0 12/06/2017 1333   HCT 40.1 08/28/2016 1520   PLT  243 12/06/2017 1300   PLT 245 08/28/2016 1520   MCV 88.2 12/06/2017 1300   MCV 89.2 07/30/2017 1740   MCV 91 08/28/2016 1520   MCH 30.9 12/06/2017 1300   MCHC 35.0 12/06/2017 1300   RDW 11.9 12/06/2017 1300   RDW 13.3 08/28/2016 1520   LYMPHSABS 2.9 12/06/2017 1300   LYMPHSABS 2.6 08/28/2016 1520   MONOABS 0.4 12/06/2017 1300   EOSABS 0.3 12/06/2017 1300   EOSABS 0.3 08/28/2016 1520   BASOSABS 0.0 12/06/2017 1300   BASOSABS 0.0 08/28/2016 1520    BMET    Component Value Date/Time   NA 136 12/06/2017 1333   NA 136 07/30/2017 1803   K 4.0 12/06/2017 1333   CL 103 12/06/2017 1333   CO2 19 (L) 12/06/2017 1300   GLUCOSE 420 (H) 12/06/2017 1333   BUN 14 12/06/2017 1333   BUN 8 07/30/2017 1803   CREATININE 0.40 (L) 12/06/2017 1333   CREATININE 0.59 07/13/2016 1112   CALCIUM 9.2 12/06/2017 1300   GFRNONAA >60 12/06/2017 1300   GFRNONAA >89 07/15/2014 0947   GFRAA >60 12/06/2017 1300   GFRAA >89 07/15/2014 0947    COAGS: Lab Results  Component Value Date   INR 0.90 12/06/2017       ASSESSMENT/PLAN: This is a 57 y.o. female underwent lifesaving percutaneous treatment of left MCA distribution stroke from a right common femoral approach earlier today.  She now has per the nurse 2-1/2 hours of ischemic time of her right lower extremity.  She did receive TPA is not had any anticoagulation but has been cleared from a neurology standpoint to have intraoperative heparin if necessary.  Given the cadaveric appearance of her right lower extremity inability to obtain pulses in the difficulty with transducing waveforms in the sheath in the right common femoral artery I discussed with her son proceeding with urgent operative intervention to  include right common femoral exposure removal of the sheath heparinization and possible angiogram stenting of her iliac arteries and possible need for fasciotomies although I do not plan to perform those during these operation.  The reason for all this is to reestablish flow to her right lower extremity given that she currently is at risk of limb loss.  She will be at some risk for bleeding given heparinization with recent TPA administration with some concern of hemorrhagic conversion of her stroke although I was unable to "have any percentages with this surgery would be impossible without heparinization at this time.  The reason to do fasciotomies would be because we cannot follow her neurologic exam but given the short time course of ischemia per report I think we will hold on fasciotomies unless absolutely necessary and the operation becomes complex.  Her son has very good understanding of all these procedures and is willing to sign consent.  We will plan for urgent operation tonight.  Laderrick Wilk C. Randie Heinz, MD [facility] Office: [phone] Pager: [phone]

## 2017-12-07 NOTE — Progress Notes (Signed)
PT Cancellation Note  Patient Details Name: Brittany [name] MRN: [id number] DOB: 23-Nov-1960   Cancelled Treatment:    Reason Eval/Treat Not Completed: Medical issues which prohibited therapy;Patient not medically ready, patient had to return to OR last evening, currently patient flat in bed, request to hold PT at this time, will hollow up for evaluation as appropriate.   Fabio AsaDevon J [name] 12/07/2017, 8:09 AM [name], PT DPT  Board Certified Neurologic Specialist [phone]

## 2017-12-08 ENCOUNTER — Inpatient Hospital Stay (HOSPITAL_COMMUNITY): Payer: BLUE CROSS/BLUE SHIELD

## 2017-12-08 ENCOUNTER — Encounter (HOSPITAL_COMMUNITY): Payer: Self-pay | Admitting: Vascular Surgery

## 2017-12-08 DIAGNOSIS — I639 Cerebral infarction, unspecified: Secondary | ICD-10-CM

## 2017-12-08 DIAGNOSIS — I959 Hypotension, unspecified: Secondary | ICD-10-CM

## 2017-12-08 DIAGNOSIS — I6602 Occlusion and stenosis of left middle cerebral artery: Secondary | ICD-10-CM

## 2017-12-08 DIAGNOSIS — I63412 Cerebral infarction due to embolism of left middle cerebral artery: Secondary | ICD-10-CM

## 2017-12-08 LAB — BASIC METABOLIC PANEL
Anion gap: 10 (ref 5–15)
BUN: 9 mg/dL (ref 6–20)
CALCIUM: 8.1 mg/dL — AB (ref 8.9–10.3)
CO2: 18 mmol/L — ABNORMAL LOW (ref 22–32)
CREATININE: 0.48 mg/dL (ref 0.44–1.00)
Chloride: 111 mmol/L (ref 101–111)
GFR calc non Af Amer: >60 mL/min (ref >60)
Glucose, Bld: 175 mg/dL — ABNORMAL HIGH (ref 65–99)
Potassium: 3.7 mmol/L (ref 3.5–5.1)
Sodium: 139 mmol/L (ref 135–145)

## 2017-12-08 LAB — GLUCOSE, CAPILLARY
GLUCOSE-CAPILLARY: 119 mg/dL — AB (ref 65–99)
GLUCOSE-CAPILLARY: 127 mg/dL — AB (ref 65–99)
GLUCOSE-CAPILLARY: 271 mg/dL — AB (ref 65–99)
Glucose-Capillary: 145 mg/dL — ABNORMAL HIGH (ref 65–99)
Glucose-Capillary: 172 mg/dL — ABNORMAL HIGH (ref 65–99)
Glucose-Capillary: 159 mg/dL — ABNORMAL HIGH (ref 65–99)
Glucose-Capillary: 153 mg/dL — ABNORMAL HIGH (ref 65–99)
Glucose-Capillary: 129 mg/dL — ABNORMAL HIGH (ref 65–99)
Glucose-Capillary: 147 mg/dL — ABNORMAL HIGH (ref 65–99)
Glucose-Capillary: 174 mg/dL — ABNORMAL HIGH (ref 65–99)
Glucose-Capillary: 287 mg/dL — ABNORMAL HIGH (ref 65–99)
Glucose-Capillary: 209 mg/dL — ABNORMAL HIGH (ref 65–99)

## 2017-12-08 LAB — POCT I-STAT 4, (NA,K, GLUC, HGB,HCT)
GLUCOSE: 275 mg/dL — AB (ref 65–99)
HEMATOCRIT: 34 % — AB (ref 36.0–46.0)
HEMOGLOBIN: 11.6 g/dL — AB (ref 12.0–15.0)
POTASSIUM: 3.5 mmol/L (ref 3.5–5.1)
SODIUM: 140 mmol/L (ref 135–145)

## 2017-12-08 LAB — CBC
HEMATOCRIT: 32.7 % — AB (ref 36.0–46.0)
HEMATOCRIT: 28.9 % — AB (ref 36.0–46.0)
Hemoglobin: 11.2 g/dL — ABNORMAL LOW (ref 12.0–15.0)
Hemoglobin: 10.1 g/dL — ABNORMAL LOW (ref 12.0–15.0)
MCH: 30.9 pg (ref 26.0–34.0)
MCH: 31.4 pg (ref 26.0–34.0)
MCHC: 34.3 g/dL (ref 30.0–36.0)
MCHC: 34.9 g/dL (ref 30.0–36.0)
MCV: 90.1 fL (ref 78.0–100.0)
MCV: 89.8 fL (ref 78.0–100.0)
Platelets: 149 10*3/uL — ABNORMAL LOW (ref 150–400)
Platelets: 152 10*3/uL (ref 150–400)
RBC: 3.63 MIL/uL — ABNORMAL LOW (ref 3.87–5.11)
RBC: 3.22 MIL/uL — ABNORMAL LOW (ref 3.87–5.11)
RDW: 12.2 % (ref 11.5–15.5)
RDW: 12.2 % (ref 11.5–15.5)
WBC: 17.2 10*3/uL — ABNORMAL HIGH (ref 4.0–10.5)
WBC: 16.5 10*3/uL — ABNORMAL HIGH (ref 4.0–10.5)

## 2017-12-08 LAB — PREPARE RBC (CROSSMATCH)

## 2017-12-08 LAB — PHOSPHORUS: PHOSPHORUS: 3.1 mg/dL (ref 2.5–4.6)

## 2017-12-08 LAB — PROCALCITONIN: Procalcitonin: <0.1 ng/mL

## 2017-12-08 LAB — ECHOCARDIOGRAM COMPLETE
HEIGHTINCHES: 63 in
WEIGHTICAEL: 2148.16 [oz_av]

## 2017-12-08 LAB — MAGNESIUM: Magnesium: 1.8 mg/dL (ref 1.7–2.4)

## 2017-12-08 LAB — LACTIC ACID, PLASMA: Lactic Acid, Venous: 0.9 mmol/L (ref 0.5–1.9)

## 2017-12-08 LAB — ABO/RH: ABO/RH(D): A POS

## 2017-12-08 MED ORDER — ENOXAPARIN SODIUM 40 MG/0.4ML ~~LOC~~ SOLN
40.0000 mg | SUBCUTANEOUS | Status: DC
Start: 2017-12-08 — End: 2017-12-12
  Administered 2017-12-08 – 2017-12-11 (×4): 40 mg via SUBCUTANEOUS
  Filled 2017-12-08 (×4): qty 0.4

## 2017-12-08 MED ORDER — INSULIN DETEMIR 100 UNIT/ML ~~LOC~~ SOLN
5.0000 [IU] | Freq: Two times a day (BID) | SUBCUTANEOUS | Status: DC
  Administered 2017-12-08 – 2017-12-12 (×9): 5 [IU] via SUBCUTANEOUS
  Filled 2017-12-08 (×10): qty 0.05

## 2017-12-08 MED ORDER — ASPIRIN 325 MG PO TABS
325.0000 mg | ORAL_TABLET | Freq: Every day | ORAL | Status: DC
Start: 2017-12-08 — End: 2017-12-12
  Administered 2017-12-09 – 2017-12-11 (×3): 325 mg via ORAL
  Filled 2017-12-08 (×3): qty 1

## 2017-12-08 MED ORDER — DOCUSATE SODIUM 100 MG PO CAPS
100.0000 mg | ORAL_CAPSULE | Freq: Every day | ORAL | Status: DC
  Administered 2017-12-08 – 2017-12-12 (×5): 100 mg via ORAL
  Filled 2017-12-08 (×5): qty 1

## 2017-12-08 MED ORDER — ATORVASTATIN CALCIUM 80 MG PO TABS
80.0000 mg | ORAL_TABLET | Freq: Every day | ORAL | Status: DC
  Administered 2017-12-08 – 2017-12-11 (×4): 80 mg via ORAL
  Filled 2017-12-08 (×4): qty 1

## 2017-12-08 MED ORDER — SODIUM CHLORIDE 0.9 % IV SOLN
Freq: Once | INTRAVENOUS | Status: AC
  Administered 2017-12-10: 500 mL via INTRAVENOUS

## 2017-12-08 MED ORDER — MAGNESIUM SULFATE 2 GM/50ML IV SOLN
2.0000 g | Freq: Once | INTRAVENOUS | Status: AC
  Administered 2017-12-08: 2 g via INTRAVENOUS
  Filled 2017-12-08: qty 50

## 2017-12-08 MED ORDER — POTASSIUM CHLORIDE IN NACL 20-0.9 MEQ/L-% IV SOLN
Freq: Once | INTRAVENOUS | Status: AC
  Administered 2017-12-08: 03:00:00 via INTRAVENOUS
  Filled 2017-12-08 (×2): qty 1000

## 2017-12-08 MED ORDER — RESOURCE THICKENUP CLEAR PO POWD
ORAL | Status: DC | PRN
  Filled 2017-12-08: qty 125

## 2017-12-08 MED ORDER — SODIUM CHLORIDE 0.9 % IV SOLN: Freq: Once | INTRAVENOUS | Status: DC

## 2017-12-08 MED ORDER — INSULIN ASPART 100 UNIT/ML ~~LOC~~ SOLN
2.0000 [IU] | SUBCUTANEOUS | Status: DC
  Administered 2017-12-08 (×2): 6 [IU] via SUBCUTANEOUS
  Administered 2017-12-08: 2 [IU] via SUBCUTANEOUS
  Administered 2017-12-08 – 2017-12-09 (×2): 4 [IU] via SUBCUTANEOUS
  Administered 2017-12-09: 6 [IU] via SUBCUTANEOUS
  Administered 2017-12-09 (×2): 2 [IU] via SUBCUTANEOUS
  Administered 2017-12-10: 4 [IU] via SUBCUTANEOUS
  Administered 2017-12-10: 2 [IU] via SUBCUTANEOUS
  Administered 2017-12-10 – 2017-12-11 (×4): 4 [IU] via SUBCUTANEOUS
  Administered 2017-12-12 (×2): 2 [IU] via SUBCUTANEOUS
  Administered 2017-12-12: 4 [IU] via SUBCUTANEOUS
  Administered 2017-12-12: 2 [IU] via SUBCUTANEOUS

## 2017-12-08 MED ORDER — ROSUVASTATIN CALCIUM 10 MG PO TABS
10.0000 mg | ORAL_TABLET | Freq: Every day | ORAL | Status: DC
  Filled 2017-12-08: qty 1

## 2017-12-08 MED ORDER — PNEUMOCOCCAL VAC POLYVALENT 25 MCG/0.5ML IJ INJ
0.5000 mL | INJECTION | INTRAMUSCULAR | Status: DC
  Filled 2017-12-08: qty 0.5

## 2017-12-08 NOTE — Progress Notes (Addendum)
Critical care attending progress note:  Reason for admission: hypotension following limb revascularization procedure.  Summary of admission history and hospital course: 57 yr old female with PMHx NIDDM, HTN, HLD, previous CVA now with global aphasia secondary to an insular infarct on 3/30 w/ susp embolus distal left M2 s/p revascularization.    OR for Thrombectomy due to loss of RLE pulses on 3/31 for thromboembolectomy and a repair of right profunda femoris arteriotomy and femoral vein venotomy.  Thrombosis was felt to be due to a large sheath lying within a small artery.  Pulses were restored at completion and no fasciotomies were required.    The following issues were addressed on rounds today:  1.  Acute right lower extremity ischemia.  Vascular surgery is recommending antiplatelet therapy. On examination: RLE warm with ++ pedal pulses. Incision is clean and dry with no residual femoral bruit. - Continue ASA.  2.  Hypovolemic shock.  Now resolved following 2 L of fluid resuscitation.  Maintaining slightly higher blood pressure goal to maintain cerebral perfusion. Presently maintaining an MAP in the 65-75 range on no vasoactive infusions.  She is being volume loaded with normal saline.  She is maintaining a 3 L positive fluid balance.  Urine output is appropriate. Reperfusion of ischemic limb may have also contributed to hypotension. - Decrease iv fluids and progressive ambulation.  3. S/P L M2 stroke. Improving aphasia.  On examination: Patient's speech is fl fluent but incomprehensible (word salad).  Limited ability to follow commands largely by mimicking.  No facial asymmetry.  No focal motor deficits.  Sensation not tested due to lack of comprehension.  Echocardiogram shows normal LV systolic function and grade 2 diastolic dysfunction.  No cardio embolic source noted. -Guideline directed secondary prevention.    Safety and quality of care items:  Sedation vacation with dose reduction:  On no sedative agents Early mobility: Working with physiotherapy and speech pathology. Secondary prevention CAD: On ASA.  Will start statin once swallowing cleared Line removal: Peripheral IVs only Stress ulcer prophylaxis: Not indicated.  Stop PPI Laxative regimen: Start today Nutritional status: Well-nourished Enteral nutrition: Cleared for dysphagia 3 diet. Foley catheter removal: Remove Foley catheter. Renal dose adjustment: Normal renal function with creatinine 0.48 Electrolyte repletion: None required DVT prophylaxis: Lovenox 40 daily to start today Transfusion indications: Hemoglobin 10.1.  No indications for transfusion. Antibiotic de-escalation: On no antibiotic therapy Sepsis care elements: No signs of sepsis apart from improving leukocytosis at 16.5.  Likely reactive. Glycemic target: Appropriate glycemic control on insulin detemir and sliding scale aspart Steroid use: On no systemic steroids Labs/CXR ordered: CBC and BMP ordered daily  Summary and disposition: Improving fluent aphasia that is post revascularization.  Currently at blood pressure goals.  We will begin progressive ambulation.  If hemodynamics remain unchanged, will transfer to floor tomorrow.    Services provided include examination of the patient, review of relevant ancillary tests, prescription of lifesaving therapies, review of medications and prophylactic therapy and multidisciplinary rounding.   Lynnell Catalanavi Patrycja Mumpower, MD Critical Care Attending. [facility]. Pager: [phone] After hours: (336)

## 2017-12-08 NOTE — Progress Notes (Addendum)
Called for continued low blood pressure. Now 96/51. Has been trending down despite 2 boluses of NS tonight, from 113/59 >> 99/52 >> 105/46. Pulse has been stable and she is afebrile. Also with drop in Hgb from 15 to 11.7 over the past 15 hours, and new leukocytosis of 16.9 relative to the prior WBC of 9 15 hours ago. Yesterday, had revascularization procedure done by vascular surgery regarding acute onset of RLE limb ischemia most likely due to partial occlusion of vascular supply by the sheath which was used to obtain access for her thrombectomy.   BP (!) 96/51   Pulse 80   Temp 97.8 F (36.6 C) (Axillary)   Resp 18   Ht 5\' 3"  (1.6 m)   Wt 60.9 kg (134 lb 4.2 oz)   SpO2 96%   BMI 23.78 kg/m    Assessment: Acutely dropping blood pressures and decreased Hgb with elevated white count in a 57 year old female with left MCA stroke, s/p left M2 thrombectomy by Neuroradiology and subsequent RLE revascularization procedure by Vascular Surgery 1. DDx for dropping BP in concert with acutely decreased Hgb level includes retroperitoneal bleed or evolving psoas hematoma. 2. Alternative DDx given acutely elevated WBC is developing septic shock, although she does not have a fever.   Plan: 1. STAT CT abdomen and pelvis 2. STAT blood culture x 2 3. STAT lactic acid level 4. STAT CCM consult for additional recommendations and possibly will be requiring pressors. May require empiric antibiotics as well if CCM suspects sepsis 5. Repeat CBC in 6 hours 6. Discussed with nursing staff 7. Consult called to Dr. Arsenio LoaderSommer of CCM  Electronically signed: Dr. Caryl PinaEric Sher Hellinger

## 2017-12-08 NOTE — Progress Notes (Signed)
ABI's have been completed. Right 1.19 Left 1.3  12/08/17 4:42 PM Olen CordialGreg Dayne Chait RVT

## 2017-12-08 NOTE — Evaluation (Signed)
Occupational Therapy Evaluation Patient Details Name: Brittany CampusDenise W [name] MRN: [id number] DOB: 11-Nov-1960 Today's Date: 12/08/2017    History of Present Illness 57 yr old female with PMHx NIDDM, HTN, HLD, previous CVA now with global aphasia secondary to an insular infarct w/ suspected embolus distal left M2 s/p revascularization -> OR Thrombectomy with acute hypovolemic shock   Clinical Impression   PTA, pt reports that she lived with her husband and was independent with ADLs and IADLs. Currently pt requiring Min Guard A for UB ADLs, Mod A for LB ADLs, and Min A for functional mobility. Pt presenting with decreased balance, visual deficits, and aphasia. Pt with right inattention and required increased cues to attend to objects on right. Pt will require acute OT to increase safety and independence with ADLs and functional mobility. Due to pt's age, motivatation to return to PLOF, and deficits, recommend dc to CIR for intensive OT to optimize safety and independence with ADLs, return to PLOF, and decrease caregiver burden.     Follow Up Recommendations  CIR;Supervision/Assistance - 24 hour    Equipment Recommendations  Other (comment)(Defer to next venue)    Recommendations for Other Services Rehab consult;PT consult;Speech consult     Precautions / Restrictions Precautions Precautions: Fall Precaution Comments: rt inattention Restrictions Weight Bearing Restrictions: No      Mobility Bed Mobility Overal bed mobility: Needs Assistance Bed Mobility: Supine to Sit     Supine to sit: Supervision     General bed mobility comments: gestural commands to initiate and complete transfer  Transfers Overall transfer level: Needs assistance   Transfers: Sit to/from Stand Sit to Stand: Min assist;+2 safety/equipment         General transfer comment: min assist to stand for balance with partial LOB on initial standing and pt seems to endorse slight dizziness without hypotension     Balance Overall balance assessment: Needs assistance Sitting-balance support: No upper extremity supported;Feet supported Sitting balance-Leahy Scale: Good Sitting balance - Comments: Able to don socks at EOB   Standing balance support: Single extremity supported;During functional activity Standing balance-Leahy Scale: Poor Standing balance comment: Reliant on physical A                           ADL either performed or assessed with clinical judgement   ADL Overall ADL's : Needs assistance/impaired     Grooming: Wash/dry face;Sitting;Minimal assistance Grooming Details (indicate cue type and reason): Pt requiring Min A to guide RUE towards grooming items. Pt able to bring tissue to face. Demonstrating decreased grasp strength and difficulty pulling tissue out of box.  Pt with visual difficits as seen by reaching out towards left side of tissue box.  Upper Body Bathing: Min guard;Sitting   Lower Body Bathing: Moderate assistance;Sit to/from stand;Cueing for sequencing   Upper Body Dressing : Min guard;Cueing for sequencing;Sitting   Lower Body Dressing: Moderate assistance;Sit to/from stand;Cueing for sequencing Lower Body Dressing Details (indicate cue type and reason): Pt donning socks at EOB with increased time and demosntrating decreased problem solving. Pt requiring increased time to relize she was holding toe end of socks while pulling sock opening over her toes. Mod A for balance during dynamic balance Toilet Transfer: Minimal assistance;Ambulation;Cueing for safety;Cueing for sequencing(Simulated to recliner. )           Functional mobility during ADLs: Minimal assistance;Cueing for sequencing(Hand held A) General ADL Comments: Pt presenting with decreased funcitonal performance and is impacted by poor  balance, cognition, vision, and aphasia.      Vision Baseline Vision/History: Wears glasses(Pt reporting "yes" when asked if she wears glasses) Wears Glasses:  At all times Patient Visual Report: Blurring of vision;Other (comment)(Pt with difficulty discribing visual deficits. ) Vision Assessment?: Yes;Vision impaired- to be further tested in functional context Alignment/Gaze Preference: Head turned(Turn to left) Additional Comments: Difficult to assess due to aphasia. Pt unable to confirm diplopia. When reaching for objects pt reaches out to right and requires assistance to locate items. Pt with left head turn preference and inattention to right visual field requiring Mod-Max cues to locate items in right visual field. Able to turn head and gaze to right     Perception     Praxis      Pertinent Vitals/Pain Pain Assessment: Faces Faces Pain Scale: No hurt Pain Intervention(s): Monitored during session     Hand Dominance Right   Extremity/Trunk Assessment Upper Extremity Assessment Upper Extremity Assessment: Generalized weakness;RUE deficits/detail;LUE deficits/detail(unable to assess against resistance due to receptive aphasi) RUE Deficits / Details: Weak grasp as seen when pulling tissue out of box. Pt requiring multiple attempts to successfully pull tissue out of box. Pt able to bring hand to face. At rest, pt laying RUE on lap with slight internally flexion pattern. Noted edema at wrist and hand RUE Coordination: decreased fine motor;decreased gross motor LUE Deficits / Details: Pt with decreased grasp strength. Difficult to assess due to aphasia. Noted edema at wrist and hand LUE Coordination: decreased fine motor;decreased gross motor   Lower Extremity Assessment Lower Extremity Assessment: Defer to PT evaluation   Cervical / Trunk Assessment Cervical / Trunk Assessment: Normal   Communication Communication Communication: Expressive difficulties;Receptive difficulties   Cognition Arousal/Alertness: Awake/alert Behavior During Therapy: Flat affect Overall Cognitive Status: Difficult to assess                                  General Comments: pt able to follow gestural commands relatively well and does moderately well with verbal combined with visual command. Requiring increased time and cues throughout   General Comments       Exercises     Shoulder Instructions      Home Living Family/patient expects to be discharged to:: Private residence Living Arrangements: Spouse/significant other                               Additional Comments: pt unable to provide with aphasia and no family present      Prior Functioning/Environment Level of Independence: Independent        Comments: Pt reportign she was performing ADLs, IADLs, and "working a job". When asked about work, pt attempting to discribe job with diffiulty. Pt able to say "my babies" and then begand to tear up.        OT Problem List: Decreased strength;Decreased range of motion;Decreased activity tolerance;Impaired balance (sitting and/or standing);Impaired vision/perception;Decreased cognition;Decreased safety awareness;Decreased knowledge of use of DME or AE;Decreased coordination      OT Treatment/Interventions: Self-care/ADL training;Therapeutic exercise;Energy conservation;DME and/or AE instruction;Therapeutic activities;Patient/family education;Cognitive remediation/compensation;Visual/perceptual remediation/compensation    OT Goals(Current goals can be found in the care plan section) Acute Rehab OT Goals Patient Stated Goal: return home OT Goal Formulation: With patient Time For Goal Achievement: 12/22/17 Potential to Achieve Goals: Good ADL Goals Pt Will Perform Grooming: standing;with set-up;with supervision Pt Will Perform Upper  Body Dressing: with set-up;with supervision;sitting Pt Will Perform Lower Body Dressing: with set-up;with supervision;sit to/from stand Pt Will Transfer to Toilet: with set-up;with supervision;ambulating;regular height toilet Pt Will Perform Toileting - Clothing Manipulation and  hygiene: with set-up;with supervision;sit to/from stand Additional ADL Goal #1: Pt will locate ADL items in right visual field with Min cues  OT Frequency: Min 3X/week   Barriers to D/C:            Co-evaluation              AM-PAC PT "6 Clicks" Daily Activity     Outcome Measure Help from another person eating meals?: None Help from another person taking care of personal grooming?: A Little Help from another person toileting, which includes using toliet, bedpan, or urinal?: A Little Help from another person bathing (including washing, rinsing, drying)?: A Lot Help from another person to put on and taking off regular upper body clothing?: A Little Help from another person to put on and taking off regular lower body clothing?: A Lot 6 Click Score: 17   End of Session Equipment Utilized During Treatment: Gait belt Nurse Communication: Mobility status  Activity Tolerance: Patient tolerated treatment well Patient left: in chair;with call bell/phone within reach;with chair alarm set  OT Visit Diagnosis: Unsteadiness on feet (R26.81);Other abnormalities of gait and mobility (R26.89);Muscle weakness (generalized) (M62.81);Low vision, both eyes (H54.2);Other symptoms and signs involving cognitive function                Time: 1610-9604 OT Time Calculation (min): 26 min Charges:  OT General Charges $OT Visit: 1 Visit OT Evaluation $OT Eval Moderate Complexity: 1 Mod G-Codes:     Kingstyn Deruiter MSOT, OTR/L Acute Rehab Pager: [phone] Office: [phone]  Theodoro Grist Arieal Cuoco 12/08/2017, 8:57 AM

## 2017-12-08 NOTE — Evaluation (Signed)
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 Pt with right inattention, difficulty grasping tissue to her right and unclear if visual deficits involved as well. Pt with grossly 3/5 bil LE strength but unable to further assess accurately strength, sensation and coordination. Pt with decreased transfers, gait, function, awareness and will benefit from acute therapy to maximize mobility, function, gait and safety to decrease burden of care.   BP supine 118/58 Standing 110/53 Sitting end of session 122/61 HR 82    Follow Up Recommendations CIR;Supervision/Assistance - 24 hour    Equipment Recommendations  Other (comment)(TBD)    Recommendations for Other Services       Precautions / Restrictions Precautions Precautions: Fall Precaution Comments: rt inattention      Mobility  Bed Mobility Overal bed mobility: Needs Assistance Bed Mobility: Supine to Sit     Supine to sit: Supervision     General bed mobility comments: gestural commands to initiate and complete transfer  Transfers Overall transfer level: Needs assistance   Transfers: Sit to/from Stand Sit to Stand: Min assist         General transfer comment: min assist to stand for balance with partial LOB on initial standing and pt seems to endorse [name] dizziness without hypotension  Ambulation/Gait Ambulation/Gait assistance: Min assist Ambulation Distance (Feet): 80 Feet Assistive device: 1 person hand held assist Gait Pattern/deviations: Step-through  pattern;Decreased stride length   Gait velocity interpretation: Below normal speed for age/gender General Gait Details: pt with support of trunk against P.T. trunk without UE support able to walk with slow steady gait, with truncal support released 2 partial buckling episodes with min assist to recover.   Stairs            Wheelchair Mobility    Modified Rankin (Stroke Patients Only) Modified Rankin (Stroke Patients Only) Pre-Morbid Rankin Score: No symptoms Modified Rankin: Moderately severe disability     Balance Overall balance assessment: Needs assistance   Sitting balance-Leahy Scale: Good       Standing balance-Leahy Scale: Fair                               Pertinent Vitals/Pain Pain Assessment: No/denies pain    Home Living Family/patient expects to be discharged to:: Private residence Living Arrangements: Spouse/significant other               Additional Comments: pt unable to provide with aphasia and no family present    Prior Function Level of Independence: Independent               Hand Dominance        Extremity/Trunk Assessment   Upper Extremity Assessment Upper Extremity Assessment: Defer to OT evaluation    Lower Extremity Assessment Lower Extremity Assessment: Generalized weakness(pt at least 3/5 with bil LE unable to assess against resistance due to receptive aphasia)    Cervical / Trunk Assessment Cervical / Trunk Assessment: Normal  Communication   Communication: Expressive difficulties;Receptive difficulties  Cognition Arousal/Alertness: Awake/alert Behavior During Therapy:  Flat affect Overall Cognitive Status: Difficult to assess                                 General Comments: pt able to follow gestural commands relatively well and does moderately well with verbal combined with visual command      General Comments      Exercises     Assessment/Plan    PT Assessment Patient needs  continued PT services  PT Problem List Decreased mobility;Decreased safety awareness;Decreased activity tolerance;Decreased cognition;Decreased balance;Decreased knowledge of use of DME;Decreased coordination;Decreased knowledge of precautions       PT Treatment Interventions Gait training;Therapeutic exercise;Patient/family education;Balance training;Stair training;Functional mobility training;Neuromuscular re-education;DME instruction;Therapeutic activities    PT Goals (Current goals can be found in the Care Plan section)  Acute Rehab PT Goals Patient Stated Goal: return home PT Goal Formulation: With patient Time For Goal Achievement: 12/22/17 Potential to Achieve Goals: Good    Frequency Min 3X/week   Barriers to discharge Decreased caregiver support unsure of environment or assist as pt unable to provide and no family present    Co-evaluation               AM-PAC PT "6 Clicks" Daily Activity  Outcome Measure Difficulty turning over in bed (including adjusting bedclothes, sheets and blankets)?: None Difficulty moving from lying on back to sitting on the side of the bed? : A Little Difficulty sitting down on and standing up from a chair with arms (e.g., wheelchair, bedside commode, etc,.)?: A Little Help needed moving to and from a bed to chair (including a wheelchair)?: A Little Help needed walking in hospital room?: A Little Help needed climbing 3-5 steps with a railing? : A Little 6 Click Score: 19    End of Session Equipment Utilized During Treatment: Gait belt Activity Tolerance: Patient tolerated treatment well Patient left: in chair;with call bell/phone within reach;with chair alarm set Nurse Communication: Mobility status;Precautions PT Visit Diagnosis: Other abnormalities of gait and mobility (R26.89);Other symptoms and signs involving the nervous system (Z61.096(R29.898)    Time: [id number] PT Time Calculation (min) (ACUTE ONLY): 26 min   Charges:   PT  Evaluation $PT Eval Moderate Complexity: 1 Mod     PT G Codes:        Brittany [name], PT [phone]   Brittany [name] 12/08/2017, 8:21 AM

## 2017-12-08 NOTE — Progress Notes (Signed)
  Progress Note    12/08/2017 8:11 AM 1 Day Post-Op  Subjective: No acute issues, actively working with physical therapy  Vitals:   12/08/17 0745 12/08/17 0810  BP: (!) 118/58 122/61  Pulse: 87   Resp: (!) 21   Temp:    SpO2: 99%     Physical Exam: She is awake and alert Right femoral incision is clean dry and intact Right lower extremity compartments are soft Palpable dorsalis pedis pulse on the right Neurologic exam limited on right lower extremity  CBC    Component Value Date/Time   WBC 17.2 (H) 12/08/2017 0546   RBC 3.63 (L) 12/08/2017 0546   HGB 11.2 (L) 12/08/2017 0546   HGB 13.2 08/28/2016 1520   HCT 32.7 (L) 12/08/2017 0546   HCT 40.1 08/28/2016 1520   PLT 149 (L) 12/08/2017 0546   PLT 245 08/28/2016 1520   MCV 90.1 12/08/2017 0546   MCV 89.2 07/30/2017 1740   MCV 91 08/28/2016 1520   MCH 30.9 12/08/2017 0546   MCHC 34.3 12/08/2017 0546   RDW 12.2 12/08/2017 0546   RDW 13.3 08/28/2016 1520   LYMPHSABS 0.8 12/07/2017 0443   LYMPHSABS 2.6 08/28/2016 1520   MONOABS 0.2 12/07/2017 0443   EOSABS 0.0 12/07/2017 0443   EOSABS 0.3 08/28/2016 1520   BASOSABS 0.0 12/07/2017 0443   BASOSABS 0.0 08/28/2016 1520    BMET    Component Value Date/Time   NA 139 12/08/2017 0546   NA 136 07/30/2017 1803   K 3.7 12/08/2017 0546   CL 111 12/08/2017 0546   CO2 18 (L) 12/08/2017 0546   GLUCOSE 175 (H) 12/08/2017 0546   BUN 9 12/08/2017 0546   BUN 8 07/30/2017 1803   CREATININE 0.48 12/08/2017 0546   CREATININE 0.59 07/13/2016 1112   CALCIUM 8.1 (L) 12/08/2017 0546   GFRNONAA >60 12/08/2017 0546   GFRNONAA >89 07/15/2014 0947   GFRAA >60 12/08/2017 0546   GFRAA >89 07/15/2014 0947    INR    Component Value Date/Time   INR 0.90 12/06/2017 1300     Intake/Output Summary (Last 24 hours) at 12/08/2017 0811 Last data filed at 12/08/2017 0800 Gross per 24 hour  Intake 3774.63 ml  Output 1375 ml  Net 2399.63 ml     Assessment:  57 y.o. female is s/p right  lower extremity thromboembolectomy for acute limb ischemia  Plan: Aspirin Activity per primary Leg appears well perfused   Ethie Curless C. Randie Heinzain, MD [facility] [place] Office: [phone] Pager: [phone]  12/08/2017 8:11 AM

## 2017-12-08 NOTE — Progress Notes (Signed)
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 Patient sitting up in chair. More alert today. Demonstrates difficulty word finding. Right groin c/d/i.  Allergies: Patient has no known allergies.  Medications: Prior to Admission medications   Medication Sig Start Date End Date Taking? Authorizing Provider  albuterol (PROVENTIL) (2.5 MG/3ML) 0.083% nebulizer solution Take 3 mLs (2.5 mg total) by nebulization every 6 (six) hours as needed for wheezing or shortness of breath. Patient not taking: Reported on 12/07/2017 07/13/16   Sherren MochaShaw, [name], MD  Albuterol Sulfate (PROAIR RESPICLICK) 108 (90 Base) MCG/ACT AEPB Inhale 2 puffs into the lungs every 4 (four) hours as needed. Patient not taking: Reported on 12/07/2017 07/13/16   Sherren MochaShaw, [name], MD  aspirin 325 MG tablet Take 325 mg by mouth daily.    [provider]  atorvastatin (LIPITOR) 40 MG tablet Take 1 tablet (40 mg total) by mouth daily. 05/17/17   Sherren MochaShaw, [name], MD  B Complex-Biotin-FA (MULTI-B COMPLEX) CAPS Take one a day ,B complex  multivitamin over the counter or use prenatal vitamin. Patient not taking: Reported on 12/07/2017 08/28/16   Dohmeier, Porfirio Mylararmen, MD  BD PEN NEEDLE NANO U/F 32G X 4 MM MISC USE WITH VICTOZA PEN 05/23/17   Sherren MochaShaw, [name], MD  Canagliflozin-Metformin HCl ER (INVOKAMET XR) 50-1000 MG TB24 Take 2 tablets by mouth daily. 05/17/17   Sherren MochaShaw, [name], MD  estradiol (ESTRACE) 0.1 MG/GM vaginal cream Apply to perineum at night x 2 weeks, then every other night. Patient taking differently: See admin instructions. Filled 07/30/17: Apply to perineum at night x 2 weeks, then every other night. 07/30/17   Sherren MochaShaw, [name], MD  liraglutide (VICTOZA) 18 MG/3ML SOPN Inject 0.3 mLs (1.8 mg total) into the skin daily. 05/17/17   Sherren MochaShaw, [name], MD  losartan  (COZAAR) 50 MG tablet Take 1 tablet (50 mg total) by mouth daily. 05/17/17   Sherren MochaShaw, [name], MD  metFORMIN (GLUCOPHAGE) 500 MG tablet Take 500 mg by mouth 2 (two) times daily with a meal.     [provider]  primidone (MYSOLINE) 250 MG tablet Take 1 tablet (250 mg total) by mouth at bedtime. 08/28/16   Dohmeier, Porfirio Mylararmen, MD  sertraline (ZOLOFT) 100 MG tablet Take 2 tablets (200 mg total) by mouth at bedtime. 05/17/17   Sherren MochaShaw, [name], MD  sitaGLIPtin (JANUVIA) 100 MG tablet Take 1 tablet (100 mg total) by mouth daily. 05/17/17   Sherren MochaShaw, [name], MD     Vital Signs: BP (!) 108/52   Pulse 79   Temp 98.6 F (37 C) (Oral)   Resp 16   Ht 5\' 3"  (1.6 m)   Wt 134 lb 4.2 oz (60.9 kg)   SpO2 98%   BMI 23.78 kg/m   Physical Exam  Constitutional: She appears well-developed and well-nourished. No distress.  Cardiovascular: Normal rate, regular rhythm and normal heart sounds.  No murmur heard. Pulmonary/Chest: Effort normal and breath sounds normal. She has no wheezes.  Neurological:  Alert, awake, and oriented. Mixed sensory and significant expressive aphasia. PERRL. EOMS intact bilaterally. Patient able to look at you when her name is called. Visual fields not assessed. No facial asymmetry. Unable to protrude tongue. Motor power symmetric proportional to effort. Pronator drift not assessed. Fine motor and coordination not assessed. Gait not assessed. Romberg not assessed. Heel  to toe not assessed. Distal pulses 2+ bilaterally.  Skin: Skin is warm and dry.  Right groin incision soft with mild tenderness, no hematoma or active bleeding.  Psychiatric: She has a normal mood and affect. Her behavior is normal. Judgment and thought content normal.  Nursing note and vitals reviewed.   Imaging: Ct Abdomen Pelvis Wo Contrast  Result Date: 12/08/2017 CLINICAL DATA:  57 year old female with history of diabetes and cholecystectomy. Evaluate for intra-abdominal hemorrhage. EXAM: CT ABDOMEN AND PELVIS  WITHOUT CONTRAST TECHNIQUE: Multidetector CT imaging of the abdomen and pelvis was performed following the standard protocol without IV contrast. COMPARISON:  None. FINDINGS: Evaluation of this exam is limited in the absence of intravenous contrast. Lower chest: Left lung base subpleural atelectasis/scarring. There is hypoattenuation of the cardiac blood pool suggestive of a degree of anemia. Clinical correlation is recommended. Coronary vascular calcification primarily involving the LAD. No intra-abdominal free air or free fluid. Hepatobiliary: Diffuse fatty liver. No intrahepatic biliary ductal dilatation. Cholecystectomy. Pancreas: Unremarkable. No pancreatic ductal dilatation or surrounding inflammatory changes. Spleen: Normal in size without focal abnormality. Adrenals/Urinary Tract: The right adrenal gland is unremarkable. There is a 3 cm indeterminate left adrenal nodule with small peripheral calcification. This nodule is not well characterized. Further evaluation with MRI is recommended. There is a 3 mm nonobstructing stone in the inferior pole of the left kidney. No hydronephrosis. There is no hydronephrosis or nephrolithiasis on the right. The visualized ureters appear unremarkable. The urinary bladder is decompressed around a Foley catheter. Stomach/Bowel: There is no bowel obstruction or active inflammation. Normal appendix. Vascular/Lymphatic: Mild aortoiliac atherosclerotic disease. No portal venous gas. There is no adenopathy. Reproductive: The uterus and ovaries are grossly unremarkable as visualized. Other: Postprocedure changes with small pockets of air and stranding in the subcutaneous soft tissues of the right groin associated with skin incision. There is no drainable fluid collection/abscess. No hematoma. Musculoskeletal: No acute or significant osseous findings. IMPRESSION: 1. No acute intra-abdominal or pelvic pathology. No fluid collection or hematoma as clinically concern. 2. Findings of  anemia. 3. Fatty liver. 4. No bowel obstruction or active inflammation.  Normal appendix. 5. Small nonobstructing left renal calculus.  No hydronephrosis. 6. Post residual changes of the right groin as described. No drainable fluid collection or hematoma. Electronically Signed   By: Elgie Collard M.D.   On: 12/08/2017 06:08   Ct Angio Head W Or Wo Contrast  Result Date: 12/06/2017 CLINICAL DATA:  Expressive aphasia.  Right facial droop. EXAM: CT ANGIOGRAPHY HEAD AND NECK TECHNIQUE: Multidetector CT imaging of the head and neck was performed using the standard protocol during bolus administration of intravenous contrast. Multiplanar CT image reconstructions and MIPs were obtained to evaluate the vascular anatomy. Carotid stenosis measurements (when applicable) are obtained utilizing NASCET criteria, using the distal internal carotid diameter as the denominator. CONTRAST:  50mL ISOVUE-370 IOPAMIDOL (ISOVUE-370) INJECTION 76% COMPARISON:  None. FINDINGS: CTA NECK FINDINGS Aortic arch: Standard 3 vessel aortic arch. Widely patent brachiocephalic and subclavian arteries. Right carotid system: Patent without evidence of stenosis or dissection. Minimal atherosclerotic plaque at the carotid bifurcation. Left carotid system: Patent without evidence of stenosis or dissection. Minimal calcified plaque at the carotid bifurcation. Vertebral arteries: Patent and codominant without evidence of stenosis or dissection. Calcified nonstenotic plaque at the right vertebral artery origin. Skeleton: C4-C6 ACDF.  Advanced right facet arthrosis at C3-4. Other neck: No mass or enlarged lymph nodes. Upper chest: Clear lung apices. Review of the MIP images confirms the above findings  CTA HEAD FINDINGS Anterior circulation: The internal carotid arteries are patent from skull base to carotid termini with minimal non-stenotic plaque on the left. M1 segments and MCA bifurcations are widely patent. There is occlusion of a mid to distal left  M2 branch near the posterior insula. ACAs are patent without significant stenosis. No aneurysm or vascular malformation. Posterior circulation: The intracranial vertebral arteries are widely patent to the basilar. Patent PICA and SCA origins are identified bilaterally. The basilar artery is widely patent. There are left larger than right posterior communicating arteries. The PCAs are patent without evidence of significant stenosis. No aneurysm or vascular malformation. Venous sinuses: As permitted by contrast timing, patent. Anatomic variants: None. Delayed phase: Not performed. Review of the MIP images confirms the above findings IMPRESSION: 1. Mid to distal left M2 branch occlusion. 2. No large vessel occlusion or significant proximal stenosis in the head or neck. These results were communicated to Dr. Laurence Slate at 1:48 pm on 12/06/2017 by text page via the [facility] messaging system. Electronically Signed   By: Sebastian Ache M.D.   On: 12/06/2017 14:13   Ct Head Wo Contrast  Result Date: 12/07/2017 CLINICAL DATA:  Follow-up stroke. EXAM: CT HEAD WITHOUT CONTRAST TECHNIQUE: Contiguous axial images were obtained from the base of the skull through the vertex without intravenous contrast. COMPARISON:  Head CT and CTA from yesterday FINDINGS: Brain: Cytotoxic edema involving posterior left frontal and parietal lobes extending to the level of the insula. The infarct correlates with the M2 occlusion by CTA. Small volume high-density within the left sylvian fissure, diminished from intra procedural head CT at time of catheter angiography. No new distribution infarct is seen. No hydrocephalus Vascular: No visible/central high-density vessel. Skull: No acute or aggressive finding Sinuses/Orbits: Negative IMPRESSION: Moderate left frontal parietal acute infarct correlating with M2 occlusion seen yesterday. Contrast and or hemorrhage in the left sylvian fissure has diminished from intraprocedural head CT. Electronically Signed    By: Marnee Spring M.D.   On: 12/07/2017 08:11   Ct Angio Neck W Or Wo Contrast  Result Date: 12/06/2017 CLINICAL DATA:  Expressive aphasia.  Right facial droop. EXAM: CT ANGIOGRAPHY HEAD AND NECK TECHNIQUE: Multidetector CT imaging of the head and neck was performed using the standard protocol during bolus administration of intravenous contrast. Multiplanar CT image reconstructions and MIPs were obtained to evaluate the vascular anatomy. Carotid stenosis measurements (when applicable) are obtained utilizing NASCET criteria, using the distal internal carotid diameter as the denominator. CONTRAST:  50mL ISOVUE-370 IOPAMIDOL (ISOVUE-370) INJECTION 76% COMPARISON:  None. FINDINGS: CTA NECK FINDINGS Aortic arch: Standard 3 vessel aortic arch. Widely patent brachiocephalic and subclavian arteries. Right carotid system: Patent without evidence of stenosis or dissection. Minimal atherosclerotic plaque at the carotid bifurcation. Left carotid system: Patent without evidence of stenosis or dissection. Minimal calcified plaque at the carotid bifurcation. Vertebral arteries: Patent and codominant without evidence of stenosis or dissection. Calcified nonstenotic plaque at the right vertebral artery origin. Skeleton: C4-C6 ACDF.  Advanced right facet arthrosis at C3-4. Other neck: No mass or enlarged lymph nodes. Upper chest: Clear lung apices. Review of the MIP images confirms the above findings CTA HEAD FINDINGS Anterior circulation: The internal carotid arteries are patent from skull base to carotid termini with minimal non-stenotic plaque on the left. M1 segments and MCA bifurcations are widely patent. There is occlusion of a mid to distal left M2 branch near the posterior insula. ACAs are patent without significant stenosis. No aneurysm or vascular malformation. Posterior circulation:  The intracranial vertebral arteries are widely patent to the basilar. Patent PICA and SCA origins are identified bilaterally. The  basilar artery is widely patent. There are left larger than right posterior communicating arteries. The PCAs are patent without evidence of significant stenosis. No aneurysm or vascular malformation. Venous sinuses: As permitted by contrast timing, patent. Anatomic variants: None. Delayed phase: Not performed. Review of the MIP images confirms the above findings IMPRESSION: 1. Mid to distal left M2 branch occlusion. 2. No large vessel occlusion or significant proximal stenosis in the head or neck. These results were communicated to Dr. Laurence Slate at 1:48 pm on 12/06/2017 by text page via the [facility] messaging system. Electronically Signed   By: Sebastian Ache M.D.   On: 12/06/2017 14:13   Mr Brain Wo Contrast  Result Date: 12/07/2017 CLINICAL DATA:  Stroke workup. EXAM: MRI HEAD WITHOUT CONTRAST TECHNIQUE: Multiplanar, multiecho pulse sequences of the brain and surrounding structures were obtained without intravenous contrast. COMPARISON:  Head CT from earlier today. FINDINGS: Brain: Acute left MCA branch infarct affecting a moderate to large area following the sylvian fissure from the insula to the parietal lobe. There is generalized petechial hemorrhage. Intermittent sulcal FLAIR hyperintensity is likely from intravascular slow flow. Subarachnoid high-density at the level of the insula as described on head CT earlier today. The remainder of the brain appears normal. No pre-existing infarct or hemorrhage. No midline shift or entrapment. Vascular: Proximal flow voids are preserved Skull and upper cervical spine: No evidence of marrow lesion. Sinuses/Orbits: Negative IMPRESSION: 1. Acute left MCA branch infarct correlating with M2 occlusion seen by CTA yesterday. Generalized petechial hemorrhage. 2. The remainder the brain has a normal appearance. Electronically Signed   By: Marnee Spring M.D.   On: 12/07/2017 15:39   Ct Head Code Stroke Wo Contrast  Result Date: 12/06/2017 CLINICAL DATA:  Code stroke. Expressive  aphasia and right facial droop. EXAM: CT HEAD WITHOUT CONTRAST TECHNIQUE: Contiguous axial images were obtained from the base of the skull through the vertex without intravenous contrast. COMPARISON:  Brain MRI 09/11/2016 FINDINGS: Brain: There is focal hypoattenuation involving the posterior left insula likely reflecting an acute infarct. No acute cortical infarction is identified elsewhere, and there is no evidence of acute basal ganglia infarct. No intracranial hemorrhage, mass, midline shift, or extra-axial fluid collection is identified. There may be a tiny chronic lacunar infarct in the left lentiform nucleus versus a dilated perivascular space. The ventricles and sulci are normal. Vascular: Calcified atherosclerosis at the skull base. Hyperattenuation of a mid to distal M2 branch vessel in the left sylvian fissure. Skull: No fracture focal osseous lesion. Sinuses/Orbits: Mild right posterior ethmoid air cell mucosal thickening. Clear mastoid air cells. Unremarkable orbits. Other: None. ASPECTS [facility] Stroke Program Early CT Score) - Ganglionic level infarction (caudate, lentiform nuclei, internal capsule, insula, M1-M3 cortex): 6 - Supraganglionic infarction (M4-M6 cortex): 3 Total score (0-10 with 10 being normal): 9 IMPRESSION: 1. 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 No hemorrhage. 2. ASPECTS is 9. 3. Suspected embolus in a mid to distal left M2 branch vessel. These results were communicated to Dr. Laurence Slate at 1:48 pm on 12/06/2017 by text page via the [facility] messaging system. Electronically Signed   By: Sebastian Ache M.D.   On: 12/06/2017 13:49    Labs:  CBC: Recent Labs    07/30/17 1740  12/06/17 1300 12/06/17 1333 12/06/17 1538 12/07/17 0443 12/08/17 0546  WBC 8.6  --  9.0  --   --  16.9*  17.2*  HGB 15.4   < > 15.4* 15.0 11.6* 11.7* 11.2*  HCT 44.8   < > 44.0 44.0 34.0* 34.0* 32.7*  PLT  --   --  243  --   --  176 149*   < > = values in this interval not displayed.     COAGS: Recent Labs    12/06/17 1300  INR 0.90  APTT 25    BMP: Recent Labs    07/30/17 1803  12/06/17 1300 12/06/17 1333 12/06/17 1538 12/07/17 0443 12/08/17 0546  NA 136   < > 133* 136 140 136 139  K 3.7  --  3.9 4.0 3.5 3.7 3.7  CL 99  --  102 103  --  108 111  CO2 21  --  19*  --   --  19* 18*  GLUCOSE 353*   < > 410* 420* 275* 345* 175*  BUN 8  --  10 14  --  7 9  CALCIUM 9.4  --  9.2  --   --  7.9* 8.1*  CREATININE 0.58  --  0.60 0.40*  --  0.51 0.48  GFRNONAA 102  --  >60  --   --  >60 >60  GFRAA 117  --  >60  --   --  >60 >60   < > = values in this interval not displayed.    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 Patient more alert today. Still demonstrates sensory and significant expressive aphasia. Continue and Aspirin 325 mg once a day. Groin stable. Appreciate and agree with Neurology management.  Electronically Signed: Elwin Mocha, PA-C 12/08/2017, 10:39 AM   I spent a total of 20 minutes at the the patient's bedside AND on the patient's hospital floor or unit, greater than 50% of which was counseling/coordinating care for left MCA stenosis s/p revascularization.

## 2017-12-08 NOTE — Progress Notes (Signed)
  Echocardiogram 2D Echocardiogram has been performed.  Janalyn HarderWest, Mallery Harshman R 12/08/2017, 10:02 AM

## 2017-12-08 NOTE — Progress Notes (Addendum)
Rehab Admissions Coordinator Note:  Patient was screened by Clois DupesBoyette, Jenea Dake Godwin for appropriateness for an Inpatient Acute Rehab Consult per PT recommendation.   At this time, we are recommending Inpatient Rehab consult. Please place order.  Clois DupesBoyette, Griselda Tosh Godwin 12/08/2017, 8:25 AM  I can be reached at [phone].

## 2017-12-08 NOTE — Evaluation (Signed)
Speech Language Pathology Evaluation Patient Details Name: Brittany [name] MRN: [id number] DOB: June 10, 1961 Today's Date: 12/08/2017 Time: [id number] SLP Time Calculation (min) (ACUTE ONLY): 39 min  Problem List:  Patient Active Problem List   Diagnosis Date Noted  . Hypotension   . 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 Poor compliance with CPAP treatment 03/04/2017  . OSA and COPD overlap syndrome (HCC) 03/04/2017  . Simple chronic bronchitis (HCC) 03/04/2017  . MCI (mild cognitive impairment) 11/27/2016  . Left sided lacunar infarction (HCC) 03/11/2014  . Cerebrovascular small vessel disease 03/11/2014  . Insomnia, idiopathic 12/20/2013  . Snoring 12/06/2013  . Tobacco use disorder 01/22/2013  . Essential hypertension, benign 10/26/2012  . Hyperlipidemia LDL goal <70 10/26/2012  . Polypharmacy 10/26/2012  . DM (diabetes mellitus) (HCC) 10/13/2011  . Tremor 10/13/2011  . GERD (gastroesophageal reflux disease) 10/13/2011  . Depression 10/13/2011   Past Medical History:  Past Medical History:  Diagnosis Date  . Depression   . Diabetes mellitus without complication (HCC)   . Fibromyalgia   . High cholesterol    Past Surgical History:  Past Surgical History:  Procedure Laterality Date  . CHOLECYSTECTOMY    . SPINE SURGERY    . THROMBECTOMY FEMORAL ARTERY Right 12/07/2017   Procedure: THROMBECTOMY RIGHT FEMORAL ARTERY;  Surgeon: Maeola Harmanain, Brandon Christopher, MD;  [facility];  Service: Vascular;  Laterality: Right;  . TONSILLECTOMY     HPI:  57 yr old female with PMHx NIDDM, HTN, HLD, previous CVA now with global aphasia secondary to an insular infarct w/ suspected embolus distal left M2 s/p revascularization 12/06/2017.   Assessment / Plan / Recommendation Clinical Impression  Patient presents with a significant Wernicke's type aphasia characterized by significantly impaired auditory comprehension with fluent verbal output filled with semantic paraphasias,  neologisms, jargon, and perseveration without awareness. Patient will benefit from f/u SLP services to maximize communication of basic needs/wants.     SLP Assessment  SLP Recommendation/Assessment: Patient needs continued Speech Lanaguage Pathology Services SLP Visit Diagnosis: Dysphagia, unspecified (R13.10);Aphasia (R47.01)    Follow Up Recommendations  Inpatient Rehab    Frequency and Duration min 3x week  2 weeks      SLP Evaluation Cognition  Overall Cognitive Status: Difficult to assess(due to severity of dysphagia)       Comprehension  Auditory Comprehension Overall Auditory Comprehension: Impaired Yes/No Questions: Impaired Basic Biographical Questions: 0-25% accurate Commands: Impaired One Step Basic Commands: 0-24% accurate EffectiveTechniques: Visual/Gestural cues Visual Recognition/Discrimination Discrimination: Exceptions to [facility] Common Objects: Unable to indentify Reading Comprehension Reading Status: Impaired Word level: Impaired    Expression Expression Primary Mode of Expression: Verbal Verbal Expression Overall Verbal Expression: Impaired Initiation: No impairment Automatic Speech: Name;Social Response Level of Generative/Spontaneous Verbalization: Sentence Repetition: Impaired Level of Impairment: Word level Naming: Impairment Responsive: 0-25% accurate Confrontation: Impaired Common Objects: Unable to indentify Convergent: 0-24% accurate Divergent: 0-24% accurate Verbal Errors: Semantic paraphasias;Phonemic paraphasias;Neologisms;Perseveration;Jargon;Not aware of errors Pragmatics: No impairment Written Expression Dominant Hand: Right Written Expression: Not tested   Oral / Motor  Oral Motor/Sensory Function Overall Oral Motor/Sensory Function: Within functional limits Motor Speech Overall Motor Speech: Appears within functional limits for tasks assessed   GO                   Ferdinand LangoLeah Araiya Tilmon MA, CCC-SLP [phone]  Minnie Shi  Meryl 12/08/2017, 1:29 PM

## 2017-12-08 NOTE — Progress Notes (Signed)
STROKE TEAM PROGRESS NOTE   HISTORY OF PRESENT ILLNESS (per record) Brittany [name] is an 57 y.o. female with a PMH of HTN, HLD, DM, Hx of prior CVA and tobacco abuse who presents to the ED as a code stroke with reports of acute onset Global Aphasia, last seen normal at approximately 09:15 this morning. CT Head code stroke reveals Acute Left Insula Infarct with suspected embolus in distal Left M2. No contraindications were identified in patients history and IV tPA was administered. IR was notified and patient family was briefed by Dr Laurence Slate. Risks and benefits of IR procedure was explained and family wants to proceed.  Date last known well: Date: 12/06/2017 Time last known well: Time: 09:15 tPA Given: Yes Saturday 12/06/17 at 1400  Modified Rankin: Rankin Score=1 NIH Stroke Scale: 10     SUBJECTIVE (INTERVAL HISTORY) Her family is not at bedside, she developed hypotension early this morning with drop in hematocrit from 15 to 11.7 with new leukocytosis of 16.9. She is being given 1 unit of blood transfusion and received fluid resuscitation. Critical care team has been consulted. CT scan of the abdomen and pelvis did not show any evidence of retroperitoneal bleed and was unremarkable. Transthoracic echo was also unremarkable.    OBJECTIVE Temp:  [97.8 F (36.6 C)-98.7 F (37.1 C)] 98 F (36.7 C) (04/01 1600) Pulse Rate:  [72-100] 86 (04/01 1600) Cardiac Rhythm: Normal sinus rhythm (04/01 0800) Resp:  [11-24] 20 (04/01 1600) BP: (96-124)/(44-90) 119/58 (04/01 1600) SpO2:  [94 %-100 %] 94 % (04/01 1600) Arterial Line BP: (71-150)/(55-96) 92/86 (04/01 0305) FiO2 (%):  [21 %] 21 % (04/01 1200)  CBC:  Recent Labs  Lab 12/06/17 1300  12/07/17 0443 12/08/17 0546 12/08/17 1039  WBC 9.0  --  16.9* 17.2* 16.5*  NEUTROABS 5.4  --  15.9*  --   --   HGB 15.4*   < > 11.7* 11.2* 10.1*  HCT 44.0   < > 34.0* 32.7* 28.9*  MCV 88.2  --  88.8 90.1 89.8  PLT 243  --  176 149* 152   < > =  values in this interval not displayed.    Basic Metabolic Panel:  Recent Labs  Lab 12/07/17 0443 12/08/17 0546  NA 136 139  K 3.7 3.7  CL 108 111  CO2 19* 18*  GLUCOSE 345* 175*  BUN 7 9  CREATININE 0.51 0.48  CALCIUM 7.9* 8.1*  MG  --  1.8  PHOS  --  3.1    Lipid Panel:     Component Value Date/Time   CHOL 161 12/07/2017 0443   CHOL 156 05/17/2017 1119   TRIG 75 12/07/2017 0443   HDL 40 (L) 12/07/2017 0443   HDL 55 05/17/2017 1119   CHOLHDL 4.0 12/07/2017 0443   VLDL 15 12/07/2017 0443   LDLCALC 106 (H) 12/07/2017 0443   LDLCALC 81 05/17/2017 1119   HgbA1c:  Lab Results  Component Value Date   HGBA1C 11.5 (H) 12/07/2017   Urine Drug Screen:     Component Value Date/Time   LABOPIA NONE DETECTED 12/06/2017 1840   COCAINSCRNUR NONE DETECTED 12/06/2017 1840   LABBENZ NONE DETECTED 12/06/2017 1840   AMPHETMU NONE DETECTED 12/06/2017 1840   THCU NONE DETECTED 12/06/2017 1840   LABBARB NONE DETECTED 12/06/2017 1840    Alcohol Level     Component Value Date/Time   ETH <10 12/06/2017 1330    IMAGING  Ct Angio Head W Or Wo Contrast Ct Angio Neck  W Or Wo Contrast 12/06/2017 IMPRESSION:  1. Mid to distal left M2 branch occlusion.  2. No large vessel occlusion or significant proximal stenosis in the head or neck.    Ct Head Code Stroke Wo Contrast 12/06/2017 IMPRESSION:  1. 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 No hemorrhage.  2. ASPECTS is 9.  3. Suspected embolus in a mid to distal left M2 branch vessel.    CT Head Wo Contrast 12/07/2017 IMPRESSION: Moderate left frontal parietal acute infarct correlating with M2 occlusion seen yesterday. Contrast and or hemorrhage in the left sylvian fissure has diminished from intraprocedural head CT.     MRI Brain Wo Contrast - 1. Acute left MCA branch infarct correlating with M2 occlusion seen by CTA yesterday.   petechial hemorrhage.  Vascular US ABI - pending    Transthoracic Echocardiogram  -normal ejection fraction. No cardiac source of embolism     Cerebral Angiogram - Dr Corliss Skainseveshwar 12/06/2017 S/P  Lt common carotid arteriogram,followed by near complete revascularization of occluded distal M2 branch of Lt MCA with x1 pass with solitaire Fr484mm x 40 mmretriver  and 4.5 mg of IA integrelin achieving a TICI 2b reperfusion.  Physical exam: Exam: Gen: NAD, snoring but easily arousable              CV: RRR, no MRG. No Carotid Bruits. No peripheral edema, warm, nontender, normal dorsalis pedis pulses Eyes: Conjunctivae clear without exudates or hemorrhage    Neuro: Detailed Neurologic Exam  Speech:    Speech is aphasic Cognition:    The patient is aphasic and not following commands. Speaks short sentences but at times word salad and difficult to understand. She occasionally mimics some midline commands Cranial Nerves:    The pupils are equal, round, and reactive to light.  Extraocular movements are intact.  The face is symmetric. The palate elevates in the midline. Hearing intact to voice. Voice is normal. The tongue has normal motion without fasciculations.   Coordination:    No dysmetria noted  Motor Observation:    No asymmetry, no atrophy, and no involuntary movements noted.    Strength and sensation: Difficult exam due to aphasia, withdraws in all extremities.        PHYSICAL EXAM Vitals:   12/08/17 1330 12/08/17 1400 12/08/17 1500 12/08/17 1600  BP: (!) 113/59 109/90 (!) 112/53 (!) 119/58  Pulse: 85 89  86  Resp: (!) 21 20 17 20   Temp:    98 F (36.7 C)  TempSrc:    Oral  SpO2: 97% 98%  94%  Weight:      Height:          ASSESSMENT/PLAN [name] is a 57 y.o. female with history of HTN, HLD, DM, Hx of prior CVA and tobacco abuse  presenting with global aphasia. tPA Given: Yes Saturday 12/06/17 at 1400 Mechanical thrombectomy M2 branch of Left MCA -> TICI 2b reperfusion.  Stroke:  Moderate left frontal parietal acute infarct - embolic -  source unknown.  Resultant  Aphasia, hemiparesis  CT head - Moderate left frontal parietal acute infarct - Contrast and or hemorrhage in the left sylvian fissure MRI head -1. Acute left MCA branch infarct correlating with M2 occlusion seen  by CTA yesterday. Generalized petechial hemorrhage.MRA head - not performed  CTA H&N - Mid to distal left M2 branch occlusion.   Carotid Doppler - CTA neck  UDS - negative  2D Echo -unremarkable   LDL - 81  HgbA1c -  11.5  VTE prophylaxis - SCDs Check puncture sites for bleeding or hematomas. Bleeding precautions Fall precautions DIET DYS 3 Room service appropriate? Yes; Fluid consistency: Nectar Thick  aspirin 325 mg daily prior to admission, now on No antithrombotic S/P tPA  Patient counseled to be compliant with her antithrombotic medications  Ongoing aggressive stroke risk factor management  Therapy recommendations: CLR   Disposition: CLR  Hypertension  Stable  Permissive hypertension (OK if < 220/120) but gradually normalize in 5-7 days . Long-term BP goal normotensive  Hyperlipidemia  Lipid lowering medication PTA:  Lipitor 40 mg daily  LDL 81, goal < 70  Current lipid lowering medication: None - NPO ( consider Lipitor 80 mg daily when taking POs)  Continue statin at discharge  Diabetes  HgbA1c 11.5, goal < 7.0  Uncontrolled  Consider diabetic teaching nurse consult if aphasia can be overcome.  Other Stroke Risk Factors  Cigarette smoker - advised to stop smoking  ETOH use, advised to drink no more than 1 drink per day.  Hx stroke/TIA   Other Active Problems  Mild anemia  Leukocytosis -16.9 - afebrile - monitor -   S/p revascularization right leg 12/07/17-     Plan / Recommendations   Stroke workup: awaiting - 2D echo ; head CT ; MRI ; lower ext ABIs  Consider TEE / loop  Therapy Follow Up: pending  Disposition:CLR  Antiplatelet / Anticoagulation: Resume aspirin .  Statin:   Lipitor 80  mg  )  MD Follow Up: pending  Other: pending  Further risk factor modification per primary care MD: Follow Up 2 weeks  Smoking cessation  Control glucose   Personally examined patient and images, and have participated in and made any corrections needed to history, physical, neuro exam,assessment and plan as stated above.  I have personally obtained the history, evaluated lab date, reviewed imaging studies and agree with radiology interpretations. Plan start aspirin for stroke prevention and Lipitor. Check TEE and loop recorder over the next few days when stable. May consider transfer to the neurology floor in the morning. No family is available for discussion. Discussed with critical care physician. This patient is critically ill and at significant risk of neurological worsening, death and care requires constant monitoring of vital signs, hemodynamics,respiratory and cardiac monitoring, extensive review of multiple databases, frequent neurological assessment, discussion with family, other specialists and medical decision making of high complexity.I have made any additions or clarifications directly to the above note.This critical care time does not reflect procedure time, or teaching time or supervisory time of PA/NP/Med Resident etc but could involve care discussion time.  I spent 30 minutes of neurocritical care time  in the care of  this patient.       Delia Heady, MD Stroke Neurology  To contact Stroke Continuity provider, please refer to [email or web address]. After hours, contact General Neurology

## 2017-12-08 NOTE — Consult Note (Signed)
Name: Brittany [name] MRN: [id number] DOB: 1961-03-11    ADMISSION DATE:  12/06/2017 CONSULTATION DATE:  12/08/2017  REFERRING MD :  Dr. Otelia Limes    CHIEF COMPLAINT:  Hypotension   HISTORY OF PRESENT ILLNESS:   57 year old female with HTN, HLD, DM, CVA  Presents to ED on 3/30 with global aphasia. CT Head with Left Insula Infarct with suspected embolus distal Left M2. TPA given. Taken to IR for revascularization. 3/31 Vascular was consulted as patient lost pedal pulses on the right extremity. Taken Emergently to OR. Thrombectomy performed. On 4/1 patient with hypotension despite 2L NS, with hemoglobin drop from 15 to 11.7, and WBC 16.9. PCCM asked to consult.    SIGNIFICANT EVENTS  3/30 > Presents to ED > IR for revascularization  3/31 > Taken to OR for Thrombectomy of RLE  4/1 > PCCM consult for hypotension   STUDIES:  CT Head 3/30 > There is focal hypoattenuation involving the posterior left insula likely reflecting an acute infarct. No acute cortical infarction is identified elsewhere, and there is no evidence of acute basal ganglia infarct. No intracranial hemorrhage, mass, midline shift, or extra-axial fluid collection is identified. There may be a tiny chronic lacunar infarct in the left lentiform nucleus versus a dilated perivascular space. The ventricles and sulci are Normal. CT Head 3/31 > Moderate left frontal parietal acute infarct correlating with M2 occlusion seen yesterday. Contrast and or hemorrhage in the left sylvian fissure has diminished from intraprocedural head CT. MR Brain 3/31 > 1. Acute left MCA branch infarct correlating with M2 occlusion seen by CTA yesterday. Generalized petechial hemorrhage. 2. The remainder the brain has a normal appearance.  CULTURES:  Blood 4/1 >>   ANTIBIOTICS:  None.   PAST MEDICAL HISTORY :   has a past medical history of Depression, Diabetes mellitus without complication (HCC), Fibromyalgia, and High cholesterol.  has a past  surgical history that includes Cholecystectomy; Spine surgery; and Tonsillectomy. Prior to Admission medications   Medication Sig Start Date End Date Taking? Authorizing Provider  albuterol (PROVENTIL) (2.5 MG/3ML) 0.083% nebulizer solution Take 3 mLs (2.5 mg total) by nebulization every 6 (six) hours as needed for wheezing or shortness of breath. Patient not taking: Reported on 12/07/2017 07/13/16   [name], MD  Albuterol Sulfate (PROAIR RESPICLICK) 108 (90 Base) MCG/ACT AEPB Inhale 2 puffs into the lungs every 4 (four) hours as needed. Patient not taking: Reported on 12/07/2017 07/13/16   [name], MD  aspirin 325 MG tablet Take 325 mg by mouth daily.    [provider]  atorvastatin (LIPITOR) 40 MG tablet Take 1 tablet (40 mg total) by mouth daily. 05/17/17   [name], MD  B Complex-Biotin-FA (MULTI-B COMPLEX) CAPS Take one a day ,B complex  multivitamin over the counter or use prenatal vitamin. Patient not taking: Reported on 12/07/2017 08/28/16   Dohmeier, Porfirio Mylar, MD  BD PEN NEEDLE NANO U/F 32G X 4 MM MISC USE WITH VICTOZA PEN 05/23/17   [name], MD  Canagliflozin-Metformin HCl ER (INVOKAMET XR) 50-1000 MG TB24 Take 2 tablets by mouth daily. 05/17/17   [name], MD  estradiol (ESTRACE) 0.1 MG/GM vaginal cream Apply to perineum at night x 2 weeks, then every other night. Patient taking differently: See admin instructions. Filled 07/30/17: Apply to perineum at night x 2 weeks, then every other night. 07/30/17   [name], MD  liraglutide (VICTOZA) 18 MG/3ML SOPN Inject 0.3 mLs (1.8  mg total) into the skin daily. 05/17/17   Sherren MochaShaw, [name], MD  losartan (COZAAR) 50 MG tablet Take 1 tablet (50 mg total) by mouth daily. 05/17/17   Sherren MochaShaw, [name], MD  metFORMIN (GLUCOPHAGE) 500 MG tablet Take 500 mg by mouth 2 (two) times daily with a meal.     [provider]  primidone (MYSOLINE) 250 MG tablet Take 1 tablet (250 mg total) by mouth at bedtime. 08/28/16   Dohmeier, Porfirio Mylararmen, MD    sertraline (ZOLOFT) 100 MG tablet Take 2 tablets (200 mg total) by mouth at bedtime. 05/17/17   Sherren MochaShaw, [name], MD  sitaGLIPtin (JANUVIA) 100 MG tablet Take 1 tablet (100 mg total) by mouth daily. 05/17/17   Sherren MochaShaw, [name], MD   No Known Allergies  FAMILY HISTORY:  family history includes COPD in her maternal aunt; Cancer in her mother; Cancer (age of onset: 6362) in her maternal grandmother; Dementia in her father; Diabetes in her father and mother; Heart disease in her father and mother; Parkinsonism in her mother. SOCIAL HISTORY:  reports that she has been smoking cigarettes.  She has a 2.00 pack-year smoking history. She has never used smokeless tobacco. She reports that she drinks alcohol. She reports that she does not use drugs.  REVIEW OF SYSTEMS:   Able to review as patient has expressive aphasia    SUBJECTIVE:   VITAL SIGNS: Temp:  [97.8 F (36.6 C)-99 F (37.2 C)] 97.8 F (36.6 C) (04/01 0000) Pulse Rate:  [78-99] 80 (04/01 0500) Resp:  [11-23] 18 (04/01 0500) BP: (96-127)/(44-62) 96/51 (04/01 0500) SpO2:  [92 %-100 %] 96 % (04/01 0500) Arterial Line BP: (71-150)/(51-96) 92/86 (04/01 0305)  PHYSICAL EXAMINATION: General:  Adult female, no distress  Neuro:  Alert, Aphasic, pupils intact, follows commands  HEENT:  Dry MM  Cardiovascular:  RRR, no MRG  Lungs:  Clear breath sounds, no wheeze/crackles  Abdomen:  Non-distended, active bowel sounds  Musculoskeletal:  -edema  Skin:  Warm, dry, intact   Recent Labs  Lab 12/06/17 1300 12/06/17 1333 12/07/17 0443  NA 133* 136 136  K 3.9 4.0 3.7  CL 102 103 108  CO2 19*  --  19*  BUN 10 14 7   CREATININE 0.60 0.40* 0.51  GLUCOSE 410* 420* 345*   Recent Labs  Lab 12/06/17 1300 12/06/17 1333 12/07/17 0443  HGB 15.4* 15.0 11.7*  HCT 44.0 44.0 34.0*  WBC 9.0  --  16.9*  PLT 243  --  176   Ct Angio Head W Or Wo Contrast  Result Date: 12/06/2017 CLINICAL DATA:  Expressive aphasia.  Right facial droop. EXAM: CT ANGIOGRAPHY  HEAD AND NECK TECHNIQUE: Multidetector CT imaging of the head and neck was performed using the standard protocol during bolus administration of intravenous contrast. Multiplanar CT image reconstructions and MIPs were obtained to evaluate the vascular anatomy. Carotid stenosis measurements (when applicable) are obtained utilizing NASCET criteria, using the distal internal carotid diameter as the denominator. CONTRAST:  50mL ISOVUE-370 IOPAMIDOL (ISOVUE-370) INJECTION 76% COMPARISON:  None. FINDINGS: CTA NECK FINDINGS Aortic arch: Standard 3 vessel aortic arch. Widely patent brachiocephalic and subclavian arteries. Right carotid system: Patent without evidence of stenosis or dissection. Minimal atherosclerotic plaque at the carotid bifurcation. Left carotid system: Patent without evidence of stenosis or dissection. Minimal calcified plaque at the carotid bifurcation. Vertebral arteries: Patent and codominant without evidence of stenosis or dissection. Calcified nonstenotic plaque at the right vertebral artery origin. Skeleton: C4-C6 ACDF.  Advanced right facet arthrosis  at C3-4. Other neck: No mass or enlarged lymph nodes. Upper chest: Clear lung apices. Review of the MIP images confirms the above findings CTA HEAD FINDINGS Anterior circulation: The internal carotid arteries are patent from skull base to carotid termini with minimal non-stenotic plaque on the left. M1 segments and MCA bifurcations are widely patent. There is occlusion of a mid to distal left M2 branch near the posterior insula. ACAs are patent without significant stenosis. No aneurysm or vascular malformation. Posterior circulation: The intracranial vertebral arteries are widely patent to the basilar. Patent PICA and SCA origins are identified bilaterally. The basilar artery is widely patent. There are left larger than right posterior communicating arteries. The PCAs are patent without evidence of significant stenosis. No aneurysm or vascular  malformation. Venous sinuses: As permitted by contrast timing, patent. Anatomic variants: None. Delayed phase: Not performed. Review of the MIP images confirms the above findings IMPRESSION: 1. Mid to distal left M2 branch occlusion. 2. No large vessel occlusion or significant proximal stenosis in the head or neck. These results were communicated to Dr. Laurence Slate at 1:48 pm on 12/06/2017 by text page via the [facility] messaging system. Electronically Signed   By: Sebastian Ache M.D.   On: 12/06/2017 14:13   Ct Head Wo Contrast  Result Date: 12/07/2017 CLINICAL DATA:  Follow-up stroke. EXAM: CT HEAD WITHOUT CONTRAST TECHNIQUE: Contiguous axial images were obtained from the base of the skull through the vertex without intravenous contrast. COMPARISON:  Head CT and CTA from yesterday FINDINGS: Brain: Cytotoxic edema involving posterior left frontal and parietal lobes extending to the level of the insula. The infarct correlates with the M2 occlusion by CTA. Small volume high-density within the left sylvian fissure, diminished from intra procedural head CT at time of catheter angiography. No new distribution infarct is seen. No hydrocephalus Vascular: No visible/central high-density vessel. Skull: No acute or aggressive finding Sinuses/Orbits: Negative IMPRESSION: Moderate left frontal parietal acute infarct correlating with M2 occlusion seen yesterday. Contrast and or hemorrhage in the left sylvian fissure has diminished from intraprocedural head CT. Electronically Signed   By: Marnee Spring M.D.   On: 12/07/2017 08:11   Ct Angio Neck W Or Wo Contrast  Result Date: 12/06/2017 CLINICAL DATA:  Expressive aphasia.  Right facial droop. EXAM: CT ANGIOGRAPHY HEAD AND NECK TECHNIQUE: Multidetector CT imaging of the head and neck was performed using the standard protocol during bolus administration of intravenous contrast. Multiplanar CT image reconstructions and MIPs were obtained to evaluate the vascular anatomy. Carotid  stenosis measurements (when applicable) are obtained utilizing NASCET criteria, using the distal internal carotid diameter as the denominator. CONTRAST:  50mL ISOVUE-370 IOPAMIDOL (ISOVUE-370) INJECTION 76% COMPARISON:  None. FINDINGS: CTA NECK FINDINGS Aortic arch: Standard 3 vessel aortic arch. Widely patent brachiocephalic and subclavian arteries. Right carotid system: Patent without evidence of stenosis or dissection. Minimal atherosclerotic plaque at the carotid bifurcation. Left carotid system: Patent without evidence of stenosis or dissection. Minimal calcified plaque at the carotid bifurcation. Vertebral arteries: Patent and codominant without evidence of stenosis or dissection. Calcified nonstenotic plaque at the right vertebral artery origin. Skeleton: C4-C6 ACDF.  Advanced right facet arthrosis at C3-4. Other neck: No mass or enlarged lymph nodes. Upper chest: Clear lung apices. Review of the MIP images confirms the above findings CTA HEAD FINDINGS Anterior circulation: The internal carotid arteries are patent from skull base to carotid termini with minimal non-stenotic plaque on the left. M1 segments and MCA bifurcations are widely patent. There is occlusion of  a mid to distal left M2 branch near the posterior insula. ACAs are patent without significant stenosis. No aneurysm or vascular malformation. Posterior circulation: The intracranial vertebral arteries are widely patent to the basilar. Patent PICA and SCA origins are identified bilaterally. The basilar artery is widely patent. There are left larger than right posterior communicating arteries. The PCAs are patent without evidence of significant stenosis. No aneurysm or vascular malformation. Venous sinuses: As permitted by contrast timing, patent. Anatomic variants: None. Delayed phase: Not performed. Review of the MIP images confirms the above findings IMPRESSION: 1. Mid to distal left M2 branch occlusion. 2. No large vessel occlusion or  significant proximal stenosis in the head or neck. These results were communicated to Dr. Laurence Slate at 1:48 pm on 12/06/2017 by text page via the [facility] messaging system. Electronically Signed   By: Sebastian Ache M.D.   On: 12/06/2017 14:13   Mr Brain Wo Contrast  Result Date: 12/07/2017 CLINICAL DATA:  Stroke workup. EXAM: MRI HEAD WITHOUT CONTRAST TECHNIQUE: Multiplanar, multiecho pulse sequences of the brain and surrounding structures were obtained without intravenous contrast. COMPARISON:  Head CT from earlier today. FINDINGS: Brain: Acute left MCA branch infarct affecting a moderate to large area following the sylvian fissure from the insula to the parietal lobe. There is generalized petechial hemorrhage. Intermittent sulcal FLAIR hyperintensity is likely from intravascular slow flow. Subarachnoid high-density at the level of the insula as described on head CT earlier today. The remainder of the brain appears normal. No pre-existing infarct or hemorrhage. No midline shift or entrapment. Vascular: Proximal flow voids are preserved Skull and upper cervical spine: No evidence of marrow lesion. Sinuses/Orbits: Negative IMPRESSION: 1. Acute left MCA branch infarct correlating with M2 occlusion seen by CTA yesterday. Generalized petechial hemorrhage. 2. The remainder the brain has a normal appearance. Electronically Signed   By: Marnee Spring M.D.   On: 12/07/2017 15:39   Ct Head Code Stroke Wo Contrast  Result Date: 12/06/2017 CLINICAL DATA:  Code stroke. Expressive aphasia and right facial droop. EXAM: CT HEAD WITHOUT CONTRAST TECHNIQUE: Contiguous axial images were obtained from the base of the skull through the vertex without intravenous contrast. COMPARISON:  Brain MRI 09/11/2016 FINDINGS: Brain: There is focal hypoattenuation involving the posterior left insula likely reflecting an acute infarct. No acute cortical infarction is identified elsewhere, and there is no evidence of acute basal ganglia infarct. No  intracranial hemorrhage, mass, midline shift, or extra-axial fluid collection is identified. There may be a tiny chronic lacunar infarct in the left lentiform nucleus versus a dilated perivascular space. The ventricles and sulci are normal. Vascular: Calcified atherosclerosis at the skull base. Hyperattenuation of a mid to distal M2 branch vessel in the left sylvian fissure. Skull: No fracture focal osseous lesion. Sinuses/Orbits: Mild right posterior ethmoid air cell mucosal thickening. Clear mastoid air cells. Unremarkable orbits. Other: None. ASPECTS [facility] Stroke Program Early CT Score) - Ganglionic level infarction (caudate, lentiform nuclei, internal capsule, insula, M1-M3 cortex): 6 - Supraganglionic infarction (M4-M6 cortex): 3 Total score (0-10 with 10 being normal): 9 IMPRESSION: 1. 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 No hemorrhage. 2. ASPECTS is 9. 3. Suspected embolus in a mid to distal left M2 branch vessel. These results were communicated to Dr. Laurence Slate at 1:48 pm on 12/06/2017 by text page via the [facility] messaging system. Electronically Signed   By: Sebastian Ache M.D.   On: 12/06/2017 13:49    ASSESSMENT / PLAN:  Hypotension secondary to hypovolemia vs sepsis (patient  does not appear toxic on exam)  Plan  -Previous documented baseline systolic 120-130  -Given 2L NS  -Noted hemoglobin gtt from 15 > 11.7 > 11.2  Plan  -Cardiac Monitoring  -Systolic Goal 110-140 per Neurology  -Can use NEO if need to maintain systolic within desired range  -NS @ 100 ml/hr   Anemia  -Noted hemoglobin gtt from 15 > 11.7 > 11.2  -S/P TPA and Thrombectomy  Plan  -Trend CBC > Repeat 11 am  -CT A/P > Negative for Acute Bleed  -Type and Screen Ordered   Leucocytosis > suspected reactive   Afebrile  -LA 0.9  Plan   -Trend WBC and Fever Curve  -Trend PCT  -Follow Culture Data  -Hold antibiotics at this time > no clear picture of infection     Jovita Kussmaul, AGACNP-BC [facility]  Pgr: [phone]  PCCM Pgr: [phone]

## 2017-12-08 NOTE — Evaluation (Signed)
Clinical/Bedside Swallow Evaluation Patient Details  Name: Brittany [name] MRN: [id number] Date of Birth: 04/24/1961  Today's Date: 12/08/2017 Time: SLP Start Time (ACUTE ONLY): 1143 SLP Stop Time (ACUTE ONLY): 1222 SLP Time Calculation (min) (ACUTE ONLY): 39 min  Past Medical History:  Past Medical History:  Diagnosis Date  . Depression   . Diabetes mellitus without complication (HCC)   . Fibromyalgia   . High cholesterol    Past Surgical History:  Past Surgical History:  Procedure Laterality Date  . CHOLECYSTECTOMY    . SPINE SURGERY    . THROMBECTOMY FEMORAL ARTERY Right 12/07/2017   Procedure: THROMBECTOMY RIGHT FEMORAL ARTERY;  Surgeon: Maeola Harmanain, Brandon Christopher, MD;  [facility];  Service: Vascular;  Laterality: Right;  . TONSILLECTOMY     HPI:  57 yr old female with PMHx NIDDM, HTN, HLD, previous CVA now with global aphasia secondary to an insular infarct w/ suspected embolus distal left M2 s/p revascularization 12/06/2017.   Assessment / Plan / Recommendation Clinical Impression  Patient presents with subtle indication of decreased airway protection with thin liquids characterized by throat clear post swallow, otherwise without evidence of aspiration. Given possibility of silent aspiration with acute CVA, will initiate a conservative diet and f/u with MBS 4/2. Extensive education complete with patient and family regarding results of exam, diet recommendations, and thickening instructions.  SLP Visit Diagnosis: Dysphagia, unspecified (R13.10)    Aspiration Risk       Diet Recommendation Dysphagia 3 (Mech soft);Nectar-thick liquid   Liquid Administration via: Cup;No straw Medication Administration: Whole meds with puree Supervision: Patient able to self feed;Full supervision/cueing for compensatory strategies Compensations: Slow rate;Small sips/bites Postural Changes: Seated upright at 90 degrees    Other  Recommendations Oral Care Recommendations: Oral care  BID Other Recommendations: Order thickener from pharmacy;Prohibited food (jello, ice cream, thin soups);Remove water pitcher   Follow up Recommendations Inpatient Rehab             Prognosis Prognosis for Safe Diet Advancement: Good      Swallow Study   General HPI: 57 yr old female with PMHx NIDDM, HTN, HLD, previous CVA now with global aphasia secondary to an insular infarct w/ suspected embolus distal left M2 s/p revascularization 12/06/2017. Type of Study: Bedside Swallow Evaluation Previous Swallow Assessment: none Diet Prior to this Study: NPO Temperature Spikes Noted: No Respiratory Status: Room air History of Recent Intubation: No Behavior/Cognition: Alert;Cooperative;Pleasant mood Oral Cavity Assessment: Within Functional Limits Oral Care Completed by SLP: Recent completion by staff Oral Cavity - Dentition: Dentures, top;Adequate natural dentition Vision: Functional for self-feeding Self-Feeding Abilities: Able to feed self Patient Positioning: Upright in bed Baseline Vocal Quality: Normal Volitional Cough: Cognitively unable to elicit Volitional Swallow: Unable to elicit    Oral/Motor/Sensory Function Overall Oral Motor/Sensory Function: Within functional limits   Ice Chips Ice chips: Impaired Presentation: Spoon Pharyngeal Phase Impairments: Throat Clearing - Immediate   Thin Liquid Thin Liquid: Impaired Presentation: Cup;Self Fed;Straw Pharyngeal  Phase Impairments: Multiple swallows;Throat Clearing - Immediate    Nectar Thick Nectar Thick Liquid: Within functional limits Presentation: Cup;Self Fed   Honey Thick Honey Thick Liquid: Not tested   Puree Puree: Within functional limits Presentation: Self Fed;Spoon   Solid   Opie Fanton MA, CCC-SLP [phone]    Solid: Within functional limits Presentation: Self Fed        Treysean Petruzzi Meryl 12/08/2017,1:25 PM

## 2017-12-09 ENCOUNTER — Inpatient Hospital Stay (HOSPITAL_COMMUNITY): Payer: BLUE CROSS/BLUE SHIELD

## 2017-12-09 ENCOUNTER — Encounter (HOSPITAL_COMMUNITY): Payer: Self-pay | Admitting: Interventional Radiology

## 2017-12-09 LAB — CBC WITH DIFFERENTIAL/PLATELET
Basophils Absolute: 0 10*3/uL (ref 0.0–0.1)
Basophils Relative: 0 %
EOS ABS: 0.1 10*3/uL (ref 0.0–0.7)
EOS PCT: 0 %
HCT: 34.3 % — ABNORMAL LOW (ref 36.0–46.0)
HEMOGLOBIN: 11.2 g/dL — AB (ref 12.0–15.0)
LYMPHS ABS: 3 10*3/uL (ref 0.7–4.0)
Lymphocytes Relative: 26 %
MCH: 29.4 pg (ref 26.0–34.0)
MCHC: 32.7 g/dL (ref 30.0–36.0)
MCV: 90 fL (ref 78.0–100.0)
MONOS PCT: 6 %
Monocytes Absolute: 0.7 10*3/uL (ref 0.1–1.0)
Neutro Abs: 7.5 10*3/uL (ref 1.7–7.7)
Neutrophils Relative %: 68 %
Platelets: 122 10*3/uL — ABNORMAL LOW (ref 150–400)
RBC: 3.81 MIL/uL — ABNORMAL LOW (ref 3.87–5.11)
RDW: 12.9 % (ref 11.5–15.5)
WBC: 11.2 10*3/uL — ABNORMAL HIGH (ref 4.0–10.5)

## 2017-12-09 LAB — GLUCOSE, CAPILLARY
GLUCOSE-CAPILLARY: 120 mg/dL — AB (ref 65–99)
GLUCOSE-CAPILLARY: 219 mg/dL — AB (ref 65–99)
Glucose-Capillary: 133 mg/dL — ABNORMAL HIGH (ref 65–99)
Glucose-Capillary: 151 mg/dL — ABNORMAL HIGH (ref 65–99)
Glucose-Capillary: 149 mg/dL — ABNORMAL HIGH (ref 65–99)
Glucose-Capillary: 119 mg/dL — ABNORMAL HIGH (ref 65–99)
Glucose-Capillary: 76 mg/dL (ref 65–99)

## 2017-12-09 LAB — BPAM RBC
Blood Product Expiration Date: 201904082359
ISSUE DATE / TIME: 201904011030
Unit Type and Rh: 6200

## 2017-12-09 LAB — TYPE AND SCREEN
ABO/RH(D): A POS
Antibody Screen: NEGATIVE
UNIT DIVISION: 0

## 2017-12-09 LAB — BASIC METABOLIC PANEL
Anion gap: 10 (ref 5–15)
BUN: 13 mg/dL (ref 6–20)
CHLORIDE: 111 mmol/L (ref 101–111)
CO2: 21 mmol/L — AB (ref 22–32)
CREATININE: 0.5 mg/dL (ref 0.44–1.00)
Calcium: 8.1 mg/dL — ABNORMAL LOW (ref 8.9–10.3)
GFR calc Af Amer: >60 mL/min (ref >60)
GFR calc non Af Amer: >60 mL/min (ref >60)
Glucose, Bld: 118 mg/dL — ABNORMAL HIGH (ref 65–99)
Potassium: 3.4 mmol/L — ABNORMAL LOW (ref 3.5–5.1)
Sodium: 142 mmol/L (ref 135–145)

## 2017-12-09 MED ORDER — INSULIN STARTER KIT- PEN NEEDLES (ENGLISH)
1.0000 | Freq: Once | Status: AC
  Administered 2017-12-10: 1
  Filled 2017-12-09: qty 1

## 2017-12-09 NOTE — Progress Notes (Signed)
    [facility] has been requested to perform a transesophageal echocardiogram on Brittany [name] for stroke.  After careful review of history and examination, the risks and benefits of transesophageal echocardiogram have been explained including risks of esophageal damage, perforation (1:10,000 risk), bleeding, pharyngeal hematoma as well as other potential complications associated with conscious sedation including aspiration, arrhythmia, respiratory failure and death. Alternatives to treatment were discussed with the patient and her husband, questions were answered. Patient is willing to proceed.   The procedure is scheduled for tomorrow, 12/10/17 at 16:00 with Dr. Anne FuSkains.   Berton BonJanine Skylan Lara, NP  12/09/2017 3:20 PM

## 2017-12-09 NOTE — Progress Notes (Addendum)
  Progress Note    12/09/2017 8:42 AM 2 Days Post-Op  Subjective:  Sitting up eating breakfast  Tm 99.1 now afebrile   Vitals:   12/09/17 0600 12/09/17 0700  BP: 123/60 (!) 123/59  Pulse: 65 69  Resp: 19 16  Temp:    SpO2: 94% 94%    Physical Exam: Cardiac:  regular Lungs:  Non labored Incisions:  Right groin is clean  Extremities:  Easily palpable right DP/PT pulses   CBC    Component Value Date/Time   WBC 11.2 (H) 12/09/2017 0401   RBC 3.81 (L) 12/09/2017 0401   HGB 11.2 (L) 12/09/2017 0401   HGB 13.2 08/28/2016 1520   HCT 34.3 (L) 12/09/2017 0401   HCT 40.1 08/28/2016 1520   PLT 122 (L) 12/09/2017 0401   PLT 245 08/28/2016 1520   MCV 90.0 12/09/2017 0401   MCV 89.2 07/30/2017 1740   MCV 91 08/28/2016 1520   MCH 29.4 12/09/2017 0401   MCHC 32.7 12/09/2017 0401   RDW 12.9 12/09/2017 0401   RDW 13.3 08/28/2016 1520   LYMPHSABS 3.0 12/09/2017 0401   LYMPHSABS 2.6 08/28/2016 1520   MONOABS 0.7 12/09/2017 0401   EOSABS 0.1 12/09/2017 0401   EOSABS 0.3 08/28/2016 1520   BASOSABS 0.0 12/09/2017 0401   BASOSABS 0.0 08/28/2016 1520    BMET    Component Value Date/Time   NA 142 12/09/2017 0401   NA 136 07/30/2017 1803   K 3.4 (L) 12/09/2017 0401   CL 111 12/09/2017 0401   CO2 21 (L) 12/09/2017 0401   GLUCOSE 118 (H) 12/09/2017 0401   BUN 13 12/09/2017 0401   BUN 8 07/30/2017 1803   CREATININE 0.50 12/09/2017 0401   CREATININE 0.59 07/13/2016 1112   CALCIUM 8.1 (L) 12/09/2017 0401   GFRNONAA >60 12/09/2017 0401   GFRNONAA >89 07/15/2014 0947   GFRAA >60 12/09/2017 0401   GFRAA >89 07/15/2014 0947    INR    Component Value Date/Time   INR 0.90 12/06/2017 1300     Intake/Output Summary (Last 24 hours) at 12/09/2017 0842 Last data filed at 12/09/2017 0700 Gross per 24 hour  Intake 1943.33 ml  Output 750 ml  Net 1193.33 ml     Assessment:  57 y.o. female is s/p:  right lower extremity thromboembolectomy for acute limb ischemia  2 Days  Post-Op  Plan: -pt doing well this morning with easily palpable right DP/PT pulses.   -ABI's are normal  -dry gauze to right groin to help wick moisture to help prevent wound infection-discussed with RN -DVT prophylaxis:  Lovenox -pt is on aspirin 325 daily -activity per primary   Doreatha MassedSamantha Rhyne, PA-C [facility] [phone] 12/09/2017 8:42 AM  I have independently interviewed and examined patient and agree with PA assessment and plan above.   Alycia Cooperwood C. Randie Heinzain, MD [facility] [place] Office: [phone] Pager: [phone]

## 2017-12-09 NOTE — Progress Notes (Addendum)
Inpatient Diabetes Program Recommendations  AACE/ADA: New Consensus Statement on Inpatient Glycemic Control (2015)  Target Ranges:  Prepandial:   less than 140 mg/dL      Peak postprandial:   less than 180 mg/dL (1-2 hours)      Critically ill patients:  140 - 180 mg/dL   Lab Results  Component Value Date   GLUCAP 151 (H) 12/09/2017   HGBA1C 11.5 (H) 12/07/2017    Review of Glycemic Control Results for Brittany [name], Brittany [name] (MRN [id number]) as of 12/09/2017 08:57  Ref. Range 12/08/2017 19:45 12/08/2017 23:34 12/09/2017 03:21 12/09/2017 08:13  Glucose-Capillary Latest Ref Range: 65 - 99 mg/dL 209 (H) 127 (H) 120 (H) 151 (H)   Diabetes history: Type 2 DM Outpatient Diabetes medications: Invokamet 50-1000 mg QD, Victoza 1.8 mg QD  Current orders for Inpatient glycemic control: Levemir 5 units Q12H, Novolog 2-6 units Q4H  Inpatient Diabetes Program Recommendations:    Patient will be new to insulin. Will plan to see patient today.   Patient eating more regularly, so would recommend changing frequency of insulin to Novolog 0-9 units TIDAC and consider meal coverage at Novolog 3 units TIDAC.    Addendum @1200 : Spoke with patient and Joey, patient's son in law, who will be helping in the recovery process. The plan is for the patient to be admitted to inpatient rehab. Anticipate the patient will need insulin at the time of discharge. Patient and family prefer an insulin pen.  Educated patient and family on insulin pen use at home. Reviewed contents of insulin flexpen starter kit. Reviewed all steps if insulin pen including attachment of needle, 2-unit air shot, dialing up dose, giving injection, removing needle, disposal of sharps, storage of unused insulin, disposal of insulin etc.  Patient is NOT able to provide successful return demonstration of insulin pen, however Joey was able to return demonstrate and will be with the patient when she goes home.  Encouraged Joey to continue practicing during times of  insulin administration. Insulin pen starter kit ordered. Reassurance provided to family that the patient will get additional assistance while on rehab with insulin pen. Diabetes team will plan to continue to follow this patient.  Reviewed patient's current A1c of 11.5%, which is an improvement from >14% back in September, 2018. Explained what a A1c is and what it measures. Also reviewed goal A1c with patient, importance of good glucose control @ home, and blood sugar goals. With Joey present, continued to provide education regarding diabetes, the importance of control, co-morbidies associated and goals for the patient. Encouraged to check BS atleast 2-3 times day. Patient has a meter and supplies, but will need assistance performing BS checks. At this time, patient and family have no further questions.     Thanks, Bronson Curb, MSN, RNC-OB Diabetes Coordinator [phone] (8a-5p)

## 2017-12-09 NOTE — Progress Notes (Signed)
STROKE TEAM PROGRESS NOTE   HISTORY OF PRESENT ILLNESS (per record) Brittany [name] is an 57 y.o. female with a PMH of HTN, HLD, DM, Hx of prior CVA and tobacco abuse who presents to the ED as a code stroke with reports of acute onset Global Aphasia, last seen normal at approximately 09:15 this morning. CT Head code stroke reveals Acute Left Insula Infarct with suspected embolus in distal Left M2. No contraindications were identified in patients history and IV tPA was administered. IR was notified and patient family was briefed by Dr Laurence Slate. Risks and benefits of IR procedure was explained and family wants to proceed.  Date last known well: Date: 12/06/2017 Time last known well: Time: 09:15 tPA Given: Yes Saturday 12/06/17 at 1400  Modified Rankin: Rankin Score=1 NIH Stroke Scale: 10     SUBJECTIVE (INTERVAL HISTORY) Her family is not at bedside, she  Remains neurological is stable. She still has excellent pulses in the right lower extremity. No family available at the bedside.    OBJECTIVE Temp:  [98 F (36.7 C)-99.4 F (37.4 C)] 99.3 F (37.4 C) (04/02 1200) Pulse Rate:  [64-90] 76 (04/02 1300) Cardiac Rhythm: Normal sinus rhythm (04/02 0800) Resp:  [16-26] 21 (04/02 1300) BP: (103-146)/(46-92) 116/67 (04/02 1300) SpO2:  [91 %-98 %] 97 % (04/02 1300)  CBC:  Recent Labs  Lab 12/07/17 0443  12/08/17 1039 12/09/17 0401  WBC 16.9*   < > 16.5* 11.2*  NEUTROABS 15.9*  --   --  7.5  HGB 11.7*   < > 10.1* 11.2*  HCT 34.0*   < > 28.9* 34.3*  MCV 88.8   < > 89.8 90.0  PLT 176   < > 152 122*   < > = values in this interval not displayed.    Basic Metabolic Panel:  Recent Labs  Lab 12/08/17 0546 12/09/17 0401  NA 139 142  K 3.7 3.4*  CL 111 111  CO2 18* 21*  GLUCOSE 175* 118*  BUN 9 13  CREATININE 0.48 0.50  CALCIUM 8.1* 8.1*  MG 1.8  --   PHOS 3.1  --     Lipid Panel:     Component Value Date/Time   CHOL 161 12/07/2017 0443   CHOL 156 05/17/2017 1119   TRIG  75 12/07/2017 0443   HDL 40 (L) 12/07/2017 0443   HDL 55 05/17/2017 1119   CHOLHDL 4.0 12/07/2017 0443   VLDL 15 12/07/2017 0443   LDLCALC 106 (H) 12/07/2017 0443   LDLCALC 81 05/17/2017 1119   HgbA1c:  Lab Results  Component Value Date   HGBA1C 11.5 (H) 12/07/2017   Urine Drug Screen:     Component Value Date/Time   LABOPIA NONE DETECTED 12/06/2017 1840   COCAINSCRNUR NONE DETECTED 12/06/2017 1840   LABBENZ NONE DETECTED 12/06/2017 1840   AMPHETMU NONE DETECTED 12/06/2017 1840   THCU NONE DETECTED 12/06/2017 1840   LABBARB NONE DETECTED 12/06/2017 1840    Alcohol Level     Component Value Date/Time   ETH <10 12/06/2017 1330    IMAGING  Ct Angio Head W Or Wo Contrast Ct Angio Neck W Or Wo Contrast 12/06/2017 IMPRESSION:  1. Mid to distal left M2 branch occlusion.  2. No large vessel occlusion or significant proximal stenosis in the head or neck.    Ct Head Code Stroke Wo Contrast 12/06/2017 IMPRESSION:  1. 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 No hemorrhage.  2. ASPECTS is 9.  3. Suspected embolus in  a mid to distal left M2 branch vessel.    CT Head Wo Contrast 12/07/2017 IMPRESSION: Moderate left frontal parietal acute infarct correlating with M2 occlusion seen yesterday. Contrast and or hemorrhage in the left sylvian fissure has diminished from intraprocedural head CT.     MRI Brain Wo Contrast - 1. Acute left MCA branch infarct correlating with M2 occlusion seen by CTA yesterday.   petechial hemorrhage.  Vascular US ABI - Right: Resting right ankle-brachial index is within normal range. No evidence of significant right lower extremity arterial disease. Left: Resting left ankle-brachial index is within normal range. No evidence of significant left lower extremity arterial disease    Transthoracic Echocardiogram -normal ejection fraction. No cardiac source of embolism     Cerebral Angiogram - Dr Corliss Skainseveshwar 12/06/2017 S/P  Lt common carotid  arteriogram,followed by near complete revascularization of occluded distal M2 branch of Lt MCA with x1 pass with solitaire Fr34mm x 40 mmretriver  and 4.5 mg of IA integrelin achieving a TICI 2b reperfusion.  Physical exam: Exam: Gen: NAD,pleasant middle-aged Caucasian lady not in distress            CV: RRR, no MRG. No Carotid Bruits. No peripheral edema, warm, nontender, normal dorsalis pedis pulses Lungs clear to auscultation.    Neuro: Detailed Neurologic Exam  Speech:    Speech is awake alert and aphasic Cognition:    The patient is aphasic and not following commands. Speaks short sentences but at times word salad and difficult to understand. She occasionally mimics some midline commands Cranial Nerves:    The pupils are equal, round, and reactive to light.  Extraocular movements are intact.  The face is symmetric. The palate elevates in the midline. Hearing intact to voice. Voice is normal. The tongue has normal motion without fasciculations.   Coordination:    No dysmetria noted  Motor Observation:    No asymmetry, no atrophy, and no involuntary movements noted.    Strength and sensation: Difficult exam due to aphasia, withdraws in all extremities.        PHYSICAL EXAM Vitals:   12/09/17 1000 12/09/17 1100 12/09/17 1200 12/09/17 1300  BP: 130/70 124/64 122/65 116/67  Pulse:  79 80 76  Resp: (!) 22 17 19  (!) 21  Temp:   99.3 F (37.4 C)   TempSrc:   Oral   SpO2:  96% 93% 97%  Weight:      Height:          ASSESSMENT/PLAN Brittany [name] is a 57 y.o. female with history of HTN, HLD, DM, Hx of prior CVA and tobacco abuse  presenting with global aphasia. tPA Given: Yes Saturday 12/06/17 at 1400 Mechanical thrombectomy M2 branch of Left MCA -> TICI 2b reperfusion.  Stroke:  Moderate left frontal parietal acute infarct - embolic - source unknown.  Resultant  Aphasia, hemiparesis  CT head - Moderate left frontal parietal acute infarct - Contrast and or  hemorrhage in the left sylvian fissure MRI head -1. Acute left MCA branch infarct correlating with M2 occlusion seen  by CTA yesterday. Generalized petechial hemorrhage.MRA head - not performed  CTA H&N - Mid to distal left M2 branch occlusion.   Carotid Doppler - CTA neck  UDS - negative  2D Echo -unremarkable   LDL - 81  HgbA1c - 11.5  VTE prophylaxis - SCDs Check puncture sites for bleeding or hematomas. Bleeding precautions Fall precautions Diet Carb Modified Fluid consistency: Thin; Room service appropriate? Yes  aspirin  325 mg daily prior to admission, now on No antithrombotic S/P tPA  Patient counseled to be compliant with her antithrombotic medications  Ongoing aggressive stroke risk factor management  Therapy recommendations: CLR   Disposition: CLR  Hypertension  Stable  Permissive hypertension (OK if < 220/120) but gradually normalize in 5-7 days . Long-term BP goal normotensive  Hyperlipidemia  Lipid lowering medication PTA:  Lipitor 40 mg daily  LDL 81, goal < 70  Current lipid lowering medication   Lipitor 80 mg daily  Continue statin at discharge  Diabetes  HgbA1c 11.5, goal < 7.0  Uncontrolled  Consider diabetic teaching nurse consult if aphasia can be overcome.  Other Stroke Risk Factors  Cigarette smoker - advised to stop smoking  ETOH use, advised to drink no more than 1 drink per day.  Hx stroke/TIA   Other Active Problems  Mild anemia  Leukocytosis -16.9 - afebrile - monitor -   S/p revascularization right leg 12/07/17-     Plan / Recommendations   Stroke workup: awaiting - 2D echo ; head CT ; MRI ; lower ext ABIs  Consider TEE / loop  Therapy Follow Up: pending  Disposition:CLR  Antiplatelet / Anticoagulation: Resume aspirin .  Statin:   Lipitor 80 mg  )  MD Follow Up: pending  Other: pending  Further risk factor modification per primary care MD: Follow Up 2 weeks  Smoking cessation  Control  glucose   Personally examined patient and images, and have participated in and made any corrections needed to history, physical, neuro exam,assessment and plan as stated above.  I have personally obtained the history, evaluated lab date, reviewed imaging studies and agree with radiology interpretations. Plan start aspirin for stroke prevention and Lipitor. plan TEE and loop recorder tomorrow..plan transfer to the neurology floor later today No family is available for discussion. Discussed with critical care physician. This patient is critically ill and at significant risk of neurological worsening, death and care requires constant monitoring of vital signs, hemodynamics,respiratory and cardiac monitoring, extensive review of multiple databases, frequent neurological assessment, discussion with family, other specialists and medical decision making of high complexity.I have made any additions or clarifications directly to the above note.This critical care time does not reflect procedure time, or teaching time or supervisory time of PA/NP/Med Resident etc but could involve care discussion time.  I spent 30 minutes of neurocritical care time  in the care of  this patient.       Delia Heady, MD Stroke Neurology  To contact Stroke Continuity provider, please refer to [email or web address]. After hours, contact General Neurology

## 2017-12-09 NOTE — Progress Notes (Signed)
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 Patient laying in bed accompanied by son-in-law at bedside. Patient's speech significantly improved from yesterday. Son-in-law states that last night patient video chatted with her grandchildren and her speech was clear during their conversation. She is more able to comprehend/follow simple verbal commands today. Right groin incision c/d/i.  Allergies: Patient has no known allergies.  Medications: Prior to Admission medications   Medication Sig Start Date End Date Taking? Authorizing Provider  albuterol (PROVENTIL) (2.5 MG/3ML) 0.083% nebulizer solution Take 3 mLs (2.5 mg total) by nebulization every 6 (six) hours as needed for wheezing or shortness of breath. Patient not taking: Reported on 12/07/2017 07/13/16   [name], MD  Albuterol Sulfate (PROAIR RESPICLICK) 108 (90 Base) MCG/ACT AEPB Inhale 2 puffs into the lungs every 4 (four) hours as needed. Patient not taking: Reported on 12/07/2017 07/13/16   [name], MD  aspirin 325 MG tablet Take 325 mg by mouth daily.    [provider]  atorvastatin (LIPITOR) 40 MG tablet Take 1 tablet (40 mg total) by mouth daily. 05/17/17   [name], MD  B Complex-Biotin-FA (MULTI-B COMPLEX) CAPS Take one a day ,B complex  multivitamin over the counter or use prenatal vitamin. Patient not taking: Reported on 12/07/2017 08/28/16   Dohmeier, Porfirio Mylar, MD  BD PEN NEEDLE NANO U/F 32G X 4 MM MISC USE WITH VICTOZA PEN 05/23/17   [name], MD  Canagliflozin-Metformin HCl ER (INVOKAMET XR) 50-1000 MG TB24 Take 2 tablets by mouth daily. 05/17/17    [name], MD  estradiol (ESTRACE) 0.1 MG/GM vaginal cream Apply to perineum at night x 2 weeks, then every other night. Patient taking differently: See admin instructions. Filled 07/30/17: Apply to perineum at night x 2 weeks, then every other night. 07/30/17   [name], MD  liraglutide (VICTOZA) 18 MG/3ML SOPN Inject 0.3 mLs (1.8 mg total) into the skin daily. 05/17/17   [name], MD  losartan (COZAAR) 50 MG tablet Take 1 tablet (50 mg total) by mouth daily. 05/17/17   [name], MD  metFORMIN (GLUCOPHAGE) 500 MG tablet Take 500 mg by mouth 2 (two) times daily with a meal.     [provider]  primidone (MYSOLINE) 250 MG tablet Take 1 tablet (250 mg total) by mouth at bedtime. 08/28/16   Dohmeier, Porfirio Mylar, MD  sertraline (ZOLOFT) 100 MG tablet Take 2 tablets (200 mg total) by mouth at bedtime. 05/17/17   [name], MD  sitaGLIPtin (JANUVIA) 100 MG tablet Take 1 tablet (100 mg total) by mouth daily. 05/17/17   [name], MD     Vital Signs: BP (!) 146/85   Pulse 90   Temp 99.4 F (37.4 C) (Oral)   Resp 17   Ht 5\' 3"  (1.6 m)   Wt 134 lb 4.2 oz (60.9 kg)   SpO2 96%   BMI 23.78 kg/m   Physical Exam  Constitutional: She appears well-developed and well-nourished. No distress.  Cardiovascular: Normal rate, regular rhythm and normal heart sounds.  No murmur heard. Pulmonary/Chest: Effort normal and breath sounds normal. She has no wheezes.  Neurological:  Alert,  awake, and oriented x3 . Improved speech and comprehension, mild expressive aphasia noted. PERRL. EOMS intact without nystagmus or subjective diplopia. Visual fields no assessed. No facial asymmetry. Tongue midline. Motor power symmetric proportional to effort. No pronator drift. Fine motor and coordination symmetric. Gait not assessed. Romberg not assessed. Heel to toe not assessed. Distal pulses 2+ bilaterally.  Skin: Skin is warm and dry.  Right groin incision soft with mild tenderness, no hematoma  or active bleeding.  Psychiatric: She has a normal mood and affect. Her behavior is normal. Judgment and thought content normal.    Imaging: Ct Abdomen Pelvis Wo Contrast  Result Date: 12/08/2017 CLINICAL DATA:  57 year old female with history of diabetes and cholecystectomy. Evaluate for intra-abdominal hemorrhage. EXAM: CT ABDOMEN AND PELVIS WITHOUT CONTRAST TECHNIQUE: Multidetector CT imaging of the abdomen and pelvis was performed following the standard protocol without IV contrast. COMPARISON:  None. FINDINGS: Evaluation of this exam is limited in the absence of intravenous contrast. Lower chest: Left lung base subpleural atelectasis/scarring. There is hypoattenuation of the cardiac blood pool suggestive of a degree of anemia. Clinical correlation is recommended. Coronary vascular calcification primarily involving the LAD. No intra-abdominal free air or free fluid. Hepatobiliary: Diffuse fatty liver. No intrahepatic biliary ductal dilatation. Cholecystectomy. Pancreas: Unremarkable. No pancreatic ductal dilatation or surrounding inflammatory changes. Spleen: Normal in size without focal abnormality. Adrenals/Urinary Tract: The right adrenal gland is unremarkable. There is a 3 cm indeterminate left adrenal nodule with small peripheral calcification. This nodule is not well characterized. Further evaluation with MRI is recommended. There is a 3 mm nonobstructing stone in the inferior pole of the left kidney. No hydronephrosis. There is no hydronephrosis or nephrolithiasis on the right. The visualized ureters appear unremarkable. The urinary bladder is decompressed around a Foley catheter. Stomach/Bowel: There is no bowel obstruction or active inflammation. Normal appendix. Vascular/Lymphatic: Mild aortoiliac atherosclerotic disease. No portal venous gas. There is no adenopathy. Reproductive: The uterus and ovaries are grossly unremarkable as visualized. Other: Postprocedure changes with small pockets of air  and stranding in the subcutaneous soft tissues of the right groin associated with skin incision. There is no drainable fluid collection/abscess. No hematoma. Musculoskeletal: No acute or significant osseous findings. IMPRESSION: 1. No acute intra-abdominal or pelvic pathology. No fluid collection or hematoma as clinically concern. 2. Findings of anemia. 3. Fatty liver. 4. No bowel obstruction or active inflammation.  Normal appendix. 5. Small nonobstructing left renal calculus.  No hydronephrosis. 6. Post residual changes of the right groin as described. No drainable fluid collection or hematoma. Electronically Signed   By: Elgie Collard M.D.   On: 12/08/2017 06:08   Ct Angio Head W Or Wo Contrast  Result Date: 12/06/2017 CLINICAL DATA:  Expressive aphasia.  Right facial droop. EXAM: CT ANGIOGRAPHY HEAD AND NECK TECHNIQUE: Multidetector CT imaging of the head and neck was performed using the standard protocol during bolus administration of intravenous contrast. Multiplanar CT image reconstructions and MIPs were obtained to evaluate the vascular anatomy. Carotid stenosis measurements (when applicable) are obtained utilizing NASCET criteria, using the distal internal carotid diameter as the denominator. CONTRAST:  50mL ISOVUE-370 IOPAMIDOL (ISOVUE-370) INJECTION 76% COMPARISON:  None. FINDINGS: CTA NECK FINDINGS Aortic arch: Standard 3 vessel aortic arch. Widely patent brachiocephalic and subclavian arteries. Right carotid system: Patent without evidence of stenosis or dissection. Minimal atherosclerotic plaque at the carotid bifurcation. Left carotid system: Patent without evidence of stenosis or dissection. Minimal calcified plaque at the carotid bifurcation. Vertebral arteries: Patent and codominant without  evidence of stenosis or dissection. Calcified nonstenotic plaque at the right vertebral artery origin. Skeleton: C4-C6 ACDF.  Advanced right facet arthrosis at C3-4. Other neck: No mass or enlarged lymph  nodes. Upper chest: Clear lung apices. Review of the MIP images confirms the above findings CTA HEAD FINDINGS Anterior circulation: The internal carotid arteries are patent from skull base to carotid termini with minimal non-stenotic plaque on the left. M1 segments and MCA bifurcations are widely patent. There is occlusion of a mid to distal left M2 branch near the posterior insula. ACAs are patent without significant stenosis. No aneurysm or vascular malformation. Posterior circulation: The intracranial vertebral arteries are widely patent to the basilar. Patent PICA and SCA origins are identified bilaterally. The basilar artery is widely patent. There are left larger than right posterior communicating arteries. The PCAs are patent without evidence of significant stenosis. No aneurysm or vascular malformation. Venous sinuses: As permitted by contrast timing, patent. Anatomic variants: None. Delayed phase: Not performed. Review of the MIP images confirms the above findings IMPRESSION: 1. Mid to distal left M2 branch occlusion. 2. No large vessel occlusion or significant proximal stenosis in the head or neck. These results were communicated to Dr. Laurence Slate at 1:48 pm on 12/06/2017 by text page via the [name] messaging system. Electronically Signed   By: Sebastian Ache M.D.   On: 12/06/2017 14:13   Ct Head Wo Contrast  Result Date: 12/07/2017 CLINICAL DATA:  Follow-up stroke. EXAM: CT HEAD WITHOUT CONTRAST TECHNIQUE: Contiguous axial images were obtained from the base of the skull through the vertex without intravenous contrast. COMPARISON:  Head CT and CTA from yesterday FINDINGS: Brain: Cytotoxic edema involving posterior left frontal and parietal lobes extending to the level of the insula. The infarct correlates with the M2 occlusion by CTA. Small volume high-density within the left sylvian fissure, diminished from intra procedural head CT at time of catheter angiography. No new distribution infarct is seen. No  hydrocephalus Vascular: No visible/central high-density vessel. Skull: No acute or aggressive finding Sinuses/Orbits: Negative IMPRESSION: Moderate left frontal parietal acute infarct correlating with M2 occlusion seen yesterday. Contrast and or hemorrhage in the left sylvian fissure has diminished from intraprocedural head CT. Electronically Signed   By: Marnee Spring M.D.   On: 12/07/2017 08:11   Ct Angio Neck W Or Wo Contrast  Result Date: 12/06/2017 CLINICAL DATA:  Expressive aphasia.  Right facial droop. EXAM: CT ANGIOGRAPHY HEAD AND NECK TECHNIQUE: Multidetector CT imaging of the head and neck was performed using the standard protocol during bolus administration of intravenous contrast. Multiplanar CT image reconstructions and MIPs were obtained to evaluate the vascular anatomy. Carotid stenosis measurements (when applicable) are obtained utilizing NASCET criteria, using the distal internal carotid diameter as the denominator. CONTRAST:  50mL ISOVUE-370 IOPAMIDOL (ISOVUE-370) INJECTION 76% COMPARISON:  None. FINDINGS: CTA NECK FINDINGS Aortic arch: Standard 3 vessel aortic arch. Widely patent brachiocephalic and subclavian arteries. Right carotid system: Patent without evidence of stenosis or dissection. Minimal atherosclerotic plaque at the carotid bifurcation. Left carotid system: Patent without evidence of stenosis or dissection. Minimal calcified plaque at the carotid bifurcation. Vertebral arteries: Patent and codominant without evidence of stenosis or dissection. Calcified nonstenotic plaque at the right vertebral artery origin. Skeleton: C4-C6 ACDF.  Advanced right facet arthrosis at C3-4. Other neck: No mass or enlarged lymph nodes. Upper chest: Clear lung apices. Review of the MIP images confirms the above findings CTA HEAD FINDINGS Anterior circulation: The internal carotid arteries are patent from skull base  to carotid termini with minimal non-stenotic plaque on the left. M1 segments and MCA  bifurcations are widely patent. There is occlusion of a mid to distal left M2 branch near the posterior insula. ACAs are patent without significant stenosis. No aneurysm or vascular malformation. Posterior circulation: The intracranial vertebral arteries are widely patent to the basilar. Patent PICA and SCA origins are identified bilaterally. The basilar artery is widely patent. There are left larger than right posterior communicating arteries. The PCAs are patent without evidence of significant stenosis. No aneurysm or vascular malformation. Venous sinuses: As permitted by contrast timing, patent. Anatomic variants: None. Delayed phase: Not performed. Review of the MIP images confirms the above findings IMPRESSION: 1. Mid to distal left M2 branch occlusion. 2. No large vessel occlusion or significant proximal stenosis in the head or neck. These results were communicated to Dr. Laurence Slate at 1:48 pm on 12/06/2017 by text page via the [facility] messaging system. Electronically Signed   By: Sebastian Ache M.D.   On: 12/06/2017 14:13   Mr Brain Wo Contrast  Result Date: 12/07/2017 CLINICAL DATA:  Stroke workup. EXAM: MRI HEAD WITHOUT CONTRAST TECHNIQUE: Multiplanar, multiecho pulse sequences of the brain and surrounding structures were obtained without intravenous contrast. COMPARISON:  Head CT from earlier today. FINDINGS: Brain: Acute left MCA branch infarct affecting a moderate to large area following the sylvian fissure from the insula to the parietal lobe. There is generalized petechial hemorrhage. Intermittent sulcal FLAIR hyperintensity is likely from intravascular slow flow. Subarachnoid high-density at the level of the insula as described on head CT earlier today. The remainder of the brain appears normal. No pre-existing infarct or hemorrhage. No midline shift or entrapment. Vascular: Proximal flow voids are preserved Skull and upper cervical spine: No evidence of marrow lesion. Sinuses/Orbits: Negative IMPRESSION:  1. Acute left MCA branch infarct correlating with M2 occlusion seen by CTA yesterday. Generalized petechial hemorrhage. 2. The remainder the brain has a normal appearance. Electronically Signed   By: Marnee Spring M.D.   On: 12/07/2017 15:39   Ir Ct Head Ltd  Result Date: 12/09/2017 INDICATION: Global aphasia.  Mild right-sided weakness. EXAM: 1. EMERGENT LARGE VESSEL OCCLUSION THROMBOLYSIS anterior CIRCULATION) COMPARISON:  CT angiogram of the head and neck of 12/06/2017. MEDICATIONS: Ancef 2 g IV antibiotic was administered within 1 hour of the procedure. ANESTHESIA/SEDATION: General anesthesia. CONTRAST:  Isovue 300 approximately 110 mL. FLUOROSCOPY TIME:  Fluoroscopy Time: 26 minutes 30 seconds (2586 mGy). COMPLICATIONS: None immediate. TECHNIQUE: Following a full explanation of the procedure along with the potential associated complications, an informed witnessed consent was obtained from patient's husband. The risks of intracranial hemorrhage of 10%, worsening neurological deficit, ventilator dependency, death and inability to revascularize were all reviewed in detail with the patient's husband. The patient was then put under general anesthesia by the Department of Anesthesiology at [facility]. The right groin was prepped and draped in the usual sterile fashion. Thereafter using modified Seldinger technique, transfemoral access into the right common femoral artery was obtained without difficulty. Over a 0.035 inch guidewire a 5 French Pinnacle sheath was inserted. Through this, and also over a 0.035 inch guidewire a 5 [place] JB 1 catheter was advanced to the aortic arch region and selectively positioned in the in the left common carotid artery. An arteriogram was then performed centered extra cranially and intracranially. FINDINGS: The left common carotid arteriogram demonstrates the left external carotid artery and its major branches to be widely patent. The left internal carotid artery at  the  bulb to the cranial skull base opacifies normally. There is mild to moderate tortuosity of the junction of the middle and the proximal 1/3 of the left internal carotid artery. More distally the petrous, the cavernous and the supraclinoid segments demonstrate wide patency. A dominant left posterior communicating artery is seen to opacify the left posterior cerebral artery distribution. The left middle cerebral artery and the left anterior cerebral artery opacify into the capillary and venous phases. On the lateral projection, there is truncation and occlusion of the distal M2 inferior division with slight flow into the distal M3 branches. A moderate-sized area of hypoperfusion is noted in the capillary phase in the posterior parietal cortical and subcortical region. PROCEDURE: ENDOVASCULAR REVASCULARIZATION OF THE OCCLUDED DISTAL M2 BRANCH OF THE INFERIOR DIVISION OF THE LEFT MIDDLE CEREBRAL ARTERY WITH 1 PASS WITH THE SOLITAIRE FR 4 MM X 40 MM RETRIEVAL DEVICE, AND 6 MG OF SUPER SELECTIVE INTRACRANIAL INTRA-ARTERIAL INTEGRILIN, WITH ACHIEVEMENT OF TICI 2B REPERFUSION. The diagnostic JB 1 catheter in the left common carotid artery was exchanged over a 0.035 inch 300 cm Rosen exchange guidewire for an 8 French 55 cm Brite tip neurovascular sheath. Good aspiration obtained from the side port of the neurovascular sheath. Gentle contrast injection demonstrated no evidence of spasms, dissections or of intraluminal filling defects. This was then connected to continuous heparinized saline infusion. Over the exchange 300 cm Rosen guidewire, an 85 cm FlowGate balloon catheter was advanced to the distal end of the right common carotid artery. The guidewire was removed. The [facility] guide catheter had been prepped with 50% contrast and 50% heparinized saline infusion at its balloon end. A gentle contrast injection through the [facility] guide catheter demonstrated no evidence of spasms, dissections or of intraluminal filling  defects. Over a 0.035 inch Roadrunner guidewire, using biplane roadmap technique and constant fluoroscopic guidance, the FlowGate guide catheter was advanced to the junction of the middle and the proximal 1/3 of the left internal carotid artery. The guidewire was removed. Good aspiration obtained from the hub of the [facility] guide catheter. A gentle contrast junction demonstrated no evidence of spasms, dissections or of intraluminal filling defects. Over a 0.014 inch Softip Synchro micro guidewire, the combination of the Trevo ProVue 021 microcatheter and a Sofia 132 cm 5 [place]French guide catheter was advanced to the distal end of the [facility] guide catheter. With the micro guidewire leading with a J-tip configuration, the combination was navigated without difficulty to the supraclinoid left ICA. The micro guidewire was then gently advanced into the middle cerebral artery followed by the microcatheter. The guidewire was advanced without difficulty through the occluded distal inferior division M2 M3 region followed by the microcatheter. The guidewire was removed. There was resistance to contrast from the tip of the micro microcatheter. This was then gently retrieved more proximally until free aspiration of blood was noted at the hub of the microcatheter. A gentle control arteriogram demonstrated slow antegrade flow distal to the microcatheter tip. A control arteriogram performed through the 5 ChadFrench Sofia guide catheter in the left ICA supraclinoid segment demonstrated the occluded segment of the inferior division. This time a Solitaire FR 4 mm x 40 mm retrieval device was advanced in a coaxial manner and with constant heparinized saline infusion using biplane roadmap technique and constant fluoroscopic guidance to the distal end of the microcatheter. The O ring on the delivery microcatheter was then loosened. With gentle traction with the right hand on the delivery micro guidewire, with the left hand  the delivery  microcatheter was retrieved unsheathing the retrieval device. A control arteriogram performed through the 5 [place] guide catheter demonstrated partial recanalization of the previously occluded inferior division. Slow flow was noted in the distal portion of the retrieval device. The proximal portion of the retrieval device was then captured into the microcatheter. Proximal flow arrest was then initiated by inflating the balloon of the [facility] guide catheter in the left internal carotid artery. With constant aspiration being applied was a 60 mL syringe at the hub of the [facility] guide catheter in the left internal carotid artery, and with a 25 mL syringe at the hub of the [place] guide catheter, the combination of the retrieval device and the microcatheter were retrieved and removed as constant aspiration was being applied. Aspiration was continued as the balloon was deflated in the left internal carotid artery for reversal flow arrest. A control arteriogram performed through the [facility] guide catheter after having established free back bleed at the hub, demonstrated a significantly improved flow through the previously occluded distal inferior division M2 M3 region of the left middle cerebral artery. There continued to be slow flow distally in the M3 M4 region. This prompted the use of approximately 6 mg of super selective intracranial intra-arterial Integrilin. Also performed was mechanical thrombolysis using a micro guidewire through the more distal portion of the clot through super selective positioning of the microcatheter in the angular branch of the inferior division of the middle cerebral artery. A final control arteriogram performed through the 8 [place] FlowGate guide catheter in the left internal carotid artery demonstrated significantly improved caliber and flow through the previously occluded inferior division M2 M3 region of the left middle cerebral artery with improved distal perfusion. Following the  thrombectomy, there was significant spasm noted in the more proximal portion of the left middle cerebral artery. This responded to 3 aliquots of 25 mcg of intra-arterial nitroglycerin. Throughout the procedure, the patient's blood pressure and neurological status remained stable. No angiographic evidence of mass effect, midline shift or of extravasation was noted. The 8 [place] FlowGate guide catheter, and the 8 [place] neurovascular sheath were then retrieved into the abdominal aorta and exchanged over a J-tip guidewire for an 8 [place] Pinnacle sheath. Attempts at the placement of an 8 French Angio-Seal were unsuccessful due to the kinking at the distal end of the device. This was then exchanged for an 8 [place] Pinnacle sheath over a wire. Angiograms were performed of the right groin which were demonstrated free flow into the distal vasculature in the superficial and the Profunda arteries on the right side. A Dyna CT of the brain performed at the end of procedure demonstrated no gross mass effect, midline shift or ventricular compromise. There was a focal area of a small hyper density noted in the posterior perisylvian region which progressed in a mixture of contrast with the a small amount of focal subarachnoid hemorrhage. The patient's general anesthesia was then reversed and the patient was then extubated without difficulty. Upon recovery the patient demonstrated slow recovery in terms of her level of consciousness. She was spontaneously moving her lower extremities and the right upper extremity. Showed only slight movement of the left upper extremity. Her right groin appeared soft without evidence of bleeding or hematoma. There was free flow through the arterial flash line in the right groin. Distal pulses remained palpable in both feet. The patient was then transferred to the PACU and then to neuro ICU for further management. IMPRESSION: Status post endovascular near  complete revascularization of the occluded  distal inferior division of the left middle cerebral artery in the M2 M3 region, with 1 pass using the Solitaire FR 4 mm x 40 mm retrieval device, and approximately 6 mg of super selective intracranial Integrilin and mechanical thrombolysis achieving a TICI 2b revascularization. PLAN: Follow-up in 4-6 weeks post discharge from the hospital. Electronically Signed   By: Julieanne Cotton M.D.   On: 12/08/2017 10:50   Ir Percutaneous Art Thrombectomy/infusion Intracranial Inc Diag Angio  Result Date: 12/09/2017 INDICATION: Global aphasia.  Mild right-sided weakness. EXAM: 1. EMERGENT LARGE VESSEL OCCLUSION THROMBOLYSIS anterior CIRCULATION) COMPARISON:  CT angiogram of the head and neck of 12/06/2017. MEDICATIONS: Ancef 2 g IV antibiotic was administered within 1 hour of the procedure. ANESTHESIA/SEDATION: General anesthesia. CONTRAST:  Isovue 300 approximately 110 mL. FLUOROSCOPY TIME:  Fluoroscopy Time: 26 minutes 30 seconds (2586 mGy). COMPLICATIONS: None immediate. TECHNIQUE: Following a full explanation of the procedure along with the potential associated complications, an informed witnessed consent was obtained from patient's husband. The risks of intracranial hemorrhage of 10%, worsening neurological deficit, ventilator dependency, death and inability to revascularize were all reviewed in detail with the patient's husband. The patient was then put under general anesthesia by the Department of Anesthesiology at [facility]. The right groin was prepped and draped in the usual sterile fashion. Thereafter using modified Seldinger technique, transfemoral access into the right common femoral artery was obtained without difficulty. Over a 0.035 inch guidewire a 5 French Pinnacle sheath was inserted. Through this, and also over a 0.035 inch guidewire a 5 [place] JB 1 catheter was advanced to the aortic arch region and selectively positioned in the in the left common carotid artery. An arteriogram was then  performed centered extra cranially and intracranially. FINDINGS: The left common carotid arteriogram demonstrates the left external carotid artery and its major branches to be widely patent. The left internal carotid artery at the bulb to the cranial skull base opacifies normally. There is mild to moderate tortuosity of the junction of the middle and the proximal 1/3 of the left internal carotid artery. More distally the petrous, the cavernous and the supraclinoid segments demonstrate wide patency. A dominant left posterior communicating artery is seen to opacify the left posterior cerebral artery distribution. The left middle cerebral artery and the left anterior cerebral artery opacify into the capillary and venous phases. On the lateral projection, there is truncation and occlusion of the distal M2 inferior division with slight flow into the distal M3 branches. A moderate-sized area of hypoperfusion is noted in the capillary phase in the posterior parietal cortical and subcortical region. PROCEDURE: ENDOVASCULAR REVASCULARIZATION OF THE OCCLUDED DISTAL M2 BRANCH OF THE INFERIOR DIVISION OF THE LEFT MIDDLE CEREBRAL ARTERY WITH 1 PASS WITH THE SOLITAIRE FR 4 MM X 40 MM RETRIEVAL DEVICE, AND 6 MG OF SUPER SELECTIVE INTRACRANIAL INTRA-ARTERIAL INTEGRILIN, WITH ACHIEVEMENT OF TICI 2B REPERFUSION. The diagnostic JB 1 catheter in the left common carotid artery was exchanged over a 0.035 inch 300 cm Rosen exchange guidewire for an 8 French 55 cm Brite tip neurovascular sheath. Good aspiration obtained from the side port of the neurovascular sheath. Gentle contrast injection demonstrated no evidence of spasms, dissections or of intraluminal filling defects. This was then connected to continuous heparinized saline infusion. Over the exchange 300 cm Rosen guidewire, an 85 cm FlowGate balloon catheter was advanced to the distal end of the right common carotid artery. The guidewire was removed. The [facility] guide catheter had  been prepped with 50% contrast and 50% heparinized saline infusion at its balloon end. A gentle contrast injection through the [facility] guide catheter demonstrated no evidence of spasms, dissections or of intraluminal filling defects. Over a 0.035 inch Roadrunner guidewire, using biplane roadmap technique and constant fluoroscopic guidance, the FlowGate guide catheter was advanced to the junction of the middle and the proximal 1/3 of the left internal carotid artery. The guidewire was removed. Good aspiration obtained from the hub of the [facility] guide catheter. A gentle contrast junction demonstrated no evidence of spasms, dissections or of intraluminal filling defects. Over a 0.014 inch Softip Synchro micro guidewire, the combination of the Trevo ProVue 021 microcatheter and a Sofia 132 cm 5 [place] guide catheter was advanced to the distal end of the [id number] guide catheter. With the micro guidewire leading with a J-tip configuration, the combination was navigated without difficulty to the supraclinoid left ICA. The micro guidewire was then gently advanced into the middle cerebral artery followed by the microcatheter. The guidewire was advanced without difficulty through the occluded distal inferior division M2 M3 region followed by the microcatheter. The guidewire was removed. There was resistance to contrast from the tip of the micro microcatheter. This was then gently retrieved more proximally until free aspiration of blood was noted at the hub of the microcatheter. A gentle control arteriogram demonstrated slow antegrade flow distal to the microcatheter tip. A control arteriogram performed through the 5 [place] guide catheter in the left ICA supraclinoid segment demonstrated the occluded segment of the inferior division. This time a Solitaire FR 4 mm x 40 mm retrieval device was advanced in a coaxial manner and with constant heparinized saline infusion using biplane roadmap technique and constant  fluoroscopic guidance to the distal end of the microcatheter. The O ring on the delivery microcatheter was then loosened. With gentle traction with the right hand on the delivery micro guidewire, with the left hand the delivery microcatheter was retrieved unsheathing the retrieval device. A control arteriogram performed through the 5 [place] guide catheter demonstrated partial recanalization of the previously occluded inferior division. Slow flow was noted in the distal portion of the retrieval device. The proximal portion of the retrieval device was then captured into the microcatheter. Proximal flow arrest was then initiated by inflating the balloon of the [facility] guide catheter in the left internal carotid artery. With constant aspiration being applied was a 60 mL syringe at the hub of the [facility] guide catheter in the left internal carotid artery, and with a 25 mL syringe at the hub of the [place] guide catheter, the combination of the retrieval device and the microcatheter were retrieved and removed as constant aspiration was being applied. Aspiration was continued as the balloon was deflated in the left internal carotid artery for reversal flow arrest. A control arteriogram performed through the [facility] guide catheter after having established free back bleed at the hub, demonstrated a significantly improved flow through the previously occluded distal inferior division M2 M3 region of the left middle cerebral artery. There continued to be slow flow distally in the M3 M4 region. This prompted the use of approximately 6 mg of super selective intracranial intra-arterial Integrilin. Also performed was mechanical thrombolysis using a micro guidewire through the more distal portion of the clot through super selective positioning of the microcatheter in the angular branch of the inferior division of the middle cerebral artery. A final control arteriogram performed through the 8 [place] FlowGate guide catheter in the  left internal carotid  artery demonstrated significantly improved caliber and flow through the previously occluded inferior division M2 M3 region of the left middle cerebral artery with improved distal perfusion. Following the thrombectomy, there was significant spasm noted in the more proximal portion of the left middle cerebral artery. This responded to 3 aliquots of 25 mcg of intra-arterial nitroglycerin. Throughout the procedure, the patient's blood pressure and neurological status remained stable. No angiographic evidence of mass effect, midline shift or of extravasation was noted. The 8 [place] FlowGate guide catheter, and the 8 [place] neurovascular sheath were then retrieved into the abdominal aorta and exchanged over a J-tip guidewire for an 8 [place] Pinnacle sheath. Attempts at the placement of an 8 French Angio-Seal were unsuccessful due to the kinking at the distal end of the device. This was then exchanged for an 8 [place] Pinnacle sheath over a wire. Angiograms were performed of the right groin which were demonstrated free flow into the distal vasculature in the superficial and the Profunda arteries on the right side. A Dyna CT of the brain performed at the end of procedure demonstrated no gross mass effect, midline shift or ventricular compromise. There was a focal area of a small hyper density noted in the posterior perisylvian region which progressed in a mixture of contrast with the a small amount of focal subarachnoid hemorrhage. The patient's general anesthesia was then reversed and the patient was then extubated without difficulty. Upon recovery the patient demonstrated slow recovery in terms of her level of consciousness. She was spontaneously moving her lower extremities and the right upper extremity. Showed only slight movement of the left upper extremity. Her right groin appeared soft without evidence of bleeding or hematoma. There was free flow through the arterial flash line in the right  groin. Distal pulses remained palpable in both feet. The patient was then transferred to the PACU and then to neuro ICU for further management. IMPRESSION: Status post endovascular near complete revascularization of the occluded distal inferior division of the left middle cerebral artery in the M2 M3 region, with 1 pass using the Solitaire FR 4 mm x 40 mm retrieval device, and approximately 6 mg of super selective intracranial Integrilin and mechanical thrombolysis achieving a TICI 2b revascularization. PLAN: Follow-up in 4-6 weeks post discharge from the hospital. Electronically Signed   By: Julieanne Cotton M.D.   On: 12/08/2017 10:50   Ct Head Code Stroke Wo Contrast  Result Date: 12/06/2017 CLINICAL DATA:  Code stroke. Expressive aphasia and right facial droop. EXAM: CT HEAD WITHOUT CONTRAST TECHNIQUE: Contiguous axial images were obtained from the base of the skull through the vertex without intravenous contrast. COMPARISON:  Brain MRI 09/11/2016 FINDINGS: Brain: There is focal hypoattenuation involving the posterior left insula likely reflecting an acute infarct. No acute cortical infarction is identified elsewhere, and there is no evidence of acute basal ganglia infarct. No intracranial hemorrhage, mass, midline shift, or extra-axial fluid collection is identified. There may be a tiny chronic lacunar infarct in the left lentiform nucleus versus a dilated perivascular space. The ventricles and sulci are normal. Vascular: Calcified atherosclerosis at the skull base. Hyperattenuation of a mid to distal M2 branch vessel in the left sylvian fissure. Skull: No fracture focal osseous lesion. Sinuses/Orbits: Mild right posterior ethmoid air cell mucosal thickening. Clear mastoid air cells. Unremarkable orbits. Other: None. ASPECTS [facility] Stroke Program Early CT Score) - Ganglionic level infarction (caudate, lentiform nuclei, internal capsule, insula, M1-M3 cortex): 6 - Supraganglionic infarction (M4-M6  cortex): 3 Total score (0-10 with  10 being normal): 9 IMPRESSION: 1. 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 No hemorrhage. 2. ASPECTS is 9. 3. Suspected embolus in a mid to distal left M2 branch vessel. These results were communicated to Dr. Laurence Slate at 1:48 pm on 12/06/2017 by text page via the [facility] messaging system. Electronically Signed   By: Sebastian Ache M.D.   On: 12/06/2017 13:49    Labs:  CBC: Recent Labs    12/07/17 0443 12/08/17 0546 12/08/17 1039 12/09/17 0401  WBC 16.9* 17.2* 16.5* 11.2*  HGB 11.7* 11.2* 10.1* 11.2*  HCT 34.0* 32.7* 28.9* 34.3*  PLT 176 149* 152 122*    COAGS: Recent Labs    12/06/17 1300  INR 0.90  APTT 25    BMP: Recent Labs    12/06/17 1300 12/06/17 1333 12/06/17 1538 12/07/17 0443 12/08/17 0546 12/09/17 0401  NA 133* 136 140 136 139 142  K 3.9 4.0 3.5 3.7 3.7 3.4*  CL 102 103  --  108 111 111  CO2 19*  --   --  19* 18* 21*  GLUCOSE 410* 420* 275* 345* 175* 118*  BUN 10 14  --  7 9 13   CALCIUM 9.2  --   --  7.9* 8.1* 8.1*  CREATININE 0.60 0.40*  --  0.51 0.48 0.50  GFRNONAA >60  --   --  >60 >60 >60  GFRAA >60  --   --  >60 >60 >60    LIVER FUNCTION TESTS: Recent Labs    05/17/17 1119 07/30/17 1803 12/06/17 1300  BILITOT 0.5 0.4 0.5  AST 16 14 19   ALT 13 13 17   ALKPHOS 150* 154* 154*  PROT 6.9 7.0 6.8  ALBUMIN 4.4 4.3 3.7    Assessment and Plan:  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 Significant improvement of aphasia. Continue Aspirin 325 mg once a day. Groin stable. Appreciate and agree with Neurology management.  Electronically Signed: Elwin Mocha, PA-C 12/09/2017, 10:25 AM   I spent a total of 20 minutes at the the patient's bedside AND on the patient's hospital floor or unit, greater than 50% of which was counseling/coordinating care for left MCA stenosis s/p revascularization.

## 2017-12-09 NOTE — Progress Notes (Signed)
Critical care attending progress note:  Discussed with Dr Pearlean BrownieSethi. Patient no longer has critical care issues and is potentially being discharged home today. Will sign off. Please reconsult if further issues arise.   Brittany Catalanavi Guss Farruggia, MD Critical Care Attending. [place] [facility]. Pager: [phone] After hours: [phone])

## 2017-12-09 NOTE — Progress Notes (Signed)
Modified Barium Swallow Progress Note  Patient Details  Name: Brittany [name] MRN: [id number] Date of Birth: Feb 17, 1961  Today's Date: 12/09/2017  Modified Barium Swallow completed.  Full report located under Chart Review in the Imaging Section.  Brief recommendations include the following:  Clinical Impression    Pt presents with inconsistent, minimal flash penetration with consecutive sips of thin liquid; however, laryngeal vestibule was clear of all penetrates following swallow completion. Min pharyngeal residue observed with liquids, likely related to ACDF hardware impacting UES opening, with all other consistencies consumed without residue. Oral phase was overtly functional for all trials. Of note, pt needed additional puree bolus to clear the barium pill from her esophagus, potentially indicating esophageal issues. Given pt's current swallow function and ability to protect airway despite challenging, recommend initiating regular solids and thin liquids diet with aspiration and esophageal precautions (sitting upright, remaining upright at least 30 min after PO intake, and slow/small bites). Medications administered whole in puree. No SLP follow up warranted for dysphagia. SLP will continue to follow for pt's communication goals.   Swallow Evaluation Recommendations   Recommended Consults: Consider esophageal assessment   SLP Diet Recommendations: Regular solids;Thin liquid   Liquid Administration via: Straw;Cup   Medication Administration: Whole meds with puree   Supervision: Patient able to self feed;Intermittent supervision to cue for compensatory strategies;Comment(assist with set-up)   Compensations: Slow rate;Small sips/bites   Postural Changes: Remain semi-upright after after feeds/meals (Comment);Seated upright at 90 degrees   Oral Care Recommendations: Oral care BID      Brittany [name] SLP Student Clinician   Brittany [name] 12/09/2017,2:42 PM

## 2017-12-10 ENCOUNTER — Encounter (HOSPITAL_COMMUNITY)
Admission: EM | Disposition: A | Discharge status: Home Health | Payer: Self-pay | Source: Home / Self Care | Attending: Neurology

## 2017-12-10 ENCOUNTER — Inpatient Hospital Stay (HOSPITAL_COMMUNITY): Payer: BLUE CROSS/BLUE SHIELD

## 2017-12-10 ENCOUNTER — Encounter (HOSPITAL_COMMUNITY): Payer: Self-pay

## 2017-12-10 DIAGNOSIS — I6389 Other cerebral infarction: Secondary | ICD-10-CM

## 2017-12-10 DIAGNOSIS — I69398 Other sequelae of cerebral infarction: Secondary | ICD-10-CM

## 2017-12-10 DIAGNOSIS — R4702 Dysphasia: Secondary | ICD-10-CM

## 2017-12-10 DIAGNOSIS — R269 Unspecified abnormalities of gait and mobility: Secondary | ICD-10-CM

## 2017-12-10 HISTORY — PX: TEE WITHOUT CARDIOVERSION: SHX5443

## 2017-12-10 LAB — BASIC METABOLIC PANEL
ANION GAP: 8 (ref 5–15)
BUN: 5 mg/dL — AB (ref 6–20)
CALCIUM: 8.1 mg/dL — AB (ref 8.9–10.3)
CO2: 25 mmol/L (ref 22–32)
Chloride: 107 mmol/L (ref 101–111)
Creatinine, Ser: 0.45 mg/dL (ref 0.44–1.00)
GFR calc Af Amer: >60 mL/min (ref >60)
GLUCOSE: 123 mg/dL — AB (ref 65–99)
POTASSIUM: 3 mmol/L — AB (ref 3.5–5.1)
SODIUM: 140 mmol/L (ref 135–145)

## 2017-12-10 LAB — GLUCOSE, CAPILLARY
GLUCOSE-CAPILLARY: 122 mg/dL — AB (ref 65–99)
GLUCOSE-CAPILLARY: 110 mg/dL — AB (ref 65–99)
GLUCOSE-CAPILLARY: 154 mg/dL — AB (ref 65–99)
Glucose-Capillary: 103 mg/dL — ABNORMAL HIGH (ref 65–99)
Glucose-Capillary: 153 mg/dL — ABNORMAL HIGH (ref 65–99)
Glucose-Capillary: 93 mg/dL (ref 65–99)

## 2017-12-10 LAB — CBC WITH DIFFERENTIAL/PLATELET
BASOS ABS: 0 10*3/uL (ref 0.0–0.1)
BASOS PCT: 0 %
EOS PCT: 2 %
Eosinophils Absolute: 0.2 10*3/uL (ref 0.0–0.7)
HCT: 32.4 % — ABNORMAL LOW (ref 36.0–46.0)
Hemoglobin: 10.7 g/dL — ABNORMAL LOW (ref 12.0–15.0)
LYMPHS PCT: 41 %
Lymphs Abs: 3.1 10*3/uL (ref 0.7–4.0)
MCH: 29.3 pg (ref 26.0–34.0)
MCHC: 33 g/dL (ref 30.0–36.0)
MCV: 88.8 fL (ref 78.0–100.0)
MONO ABS: 0.5 10*3/uL (ref 0.1–1.0)
Monocytes Relative: 6 %
Neutro Abs: 3.8 10*3/uL (ref 1.7–7.7)
Neutrophils Relative %: 51 %
PLATELETS: 146 10*3/uL — AB (ref 150–400)
RBC: 3.65 MIL/uL — AB (ref 3.87–5.11)
RDW: 12.4 % (ref 11.5–15.5)
WBC: 7.6 10*3/uL (ref 4.0–10.5)

## 2017-12-10 SURGERY — ECHOCARDIOGRAM, TRANSESOPHAGEAL
Anesthesia: Moderate Sedation

## 2017-12-10 MED ORDER — BUTAMBEN-TETRACAINE-BENZOCAINE 2-2-14 % EX AERO
INHALATION_SPRAY | CUTANEOUS | Status: DC | PRN
  Administered 2017-12-10: 2 via TOPICAL

## 2017-12-10 MED ORDER — FENTANYL CITRATE (PF) 100 MCG/2ML IJ SOLN
INTRAMUSCULAR | Status: DC | PRN
  Administered 2017-12-10: 25 ug via INTRAVENOUS

## 2017-12-10 MED ORDER — POTASSIUM CHLORIDE 10 MEQ/50ML IV SOLN
10.0000 meq | INTRAVENOUS | Status: DC | PRN
  Filled 2017-12-10: qty 50

## 2017-12-10 MED ORDER — MIDAZOLAM HCL 10 MG/2ML IJ SOLN
INTRAMUSCULAR | Status: DC | PRN
  Administered 2017-12-10: 1 mg via INTRAVENOUS
  Administered 2017-12-10: 2 mg via INTRAVENOUS

## 2017-12-10 MED ORDER — FENTANYL CITRATE (PF) 100 MCG/2ML IJ SOLN
INTRAMUSCULAR | Status: AC
  Filled 2017-12-10: qty 2

## 2017-12-10 MED ORDER — PNEUMOCOCCAL VAC POLYVALENT 25 MCG/0.5ML IJ INJ
0.5000 mL | INJECTION | INTRAMUSCULAR | Status: AC
  Administered 2017-12-11: 0.5 mL via INTRAMUSCULAR
  Filled 2017-12-10: qty 0.5

## 2017-12-10 MED ORDER — MIDAZOLAM HCL 5 MG/ML IJ SOLN
INTRAMUSCULAR | Status: AC
  Filled 2017-12-10: qty 2

## 2017-12-10 NOTE — Progress Notes (Signed)
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 Patient sitting up in chair. No improvement in speech from yesterday. Still demonstrating mixed aphasia. Still able to follow simple verbal commands, was able to tell me time on clock. Right groin incision c/d/i.  Allergies: Patient has no known allergies.  Medications: Prior to Admission medications   Medication Sig Start Date End Date Taking? Authorizing Provider  albuterol (PROVENTIL) (2.5 MG/3ML) 0.083% nebulizer solution Take 3 mLs (2.5 mg total) by nebulization every 6 (six) hours as needed for wheezing or shortness of breath. Patient not taking: Reported on 12/07/2017 07/13/16   [name], MD  Albuterol Sulfate (PROAIR RESPICLICK) 108 (90 Base) MCG/ACT AEPB Inhale 2 puffs into the lungs every 4 (four) hours as needed. Patient not taking: Reported on 12/07/2017 07/13/16   [name], MD  aspirin 325 MG tablet Take 325 mg by mouth daily.    [provider]  atorvastatin (LIPITOR) 40 MG tablet Take 1 tablet (40 mg total) by mouth daily. 05/17/17   [name], MD  B Complex-Biotin-FA (MULTI-B COMPLEX) CAPS Take one a day ,B complex  multivitamin over the counter or use prenatal vitamin. Patient not taking: Reported on 12/07/2017 08/28/16   Dohmeier, Porfirio Mylar, MD  BD PEN NEEDLE NANO U/F 32G X 4 MM MISC USE WITH VICTOZA PEN 05/23/17   [name], MD  Canagliflozin-Metformin HCl ER (INVOKAMET XR) 50-1000 MG TB24 Take 2 tablets by mouth daily. 05/17/17   [name], MD  estradiol (ESTRACE) 0.1 MG/GM vaginal cream Apply to perineum at night x 2 weeks, then every other  night. Patient taking differently: See admin instructions. Filled 07/30/17: Apply to perineum at night x 2 weeks, then every other night. 07/30/17   [name], MD  liraglutide (VICTOZA) 18 MG/3ML SOPN Inject 0.3 mLs (1.8 mg total) into the skin daily. 05/17/17   [name], MD  losartan (COZAAR) 50 MG tablet Take 1 tablet (50 mg total) by mouth daily. 05/17/17   [name], MD  metFORMIN (GLUCOPHAGE) 500 MG tablet Take 500 mg by mouth 2 (two) times daily with a meal.     [provider]  primidone (MYSOLINE) 250 MG tablet Take 1 tablet (250 mg total) by mouth at bedtime. 08/28/16   Dohmeier, Porfirio Mylar, MD  sertraline (ZOLOFT) 100 MG tablet Take 2 tablets (200 mg total) by mouth at bedtime. 05/17/17   [name], MD  sitaGLIPtin (JANUVIA) 100 MG tablet Take 1 tablet (100 mg total) by mouth daily. 05/17/17   [name], MD     Vital Signs: BP (!) 157/75   Pulse 82   Temp 99.3 F (37.4 C) (Oral)   Resp (!) 21   Ht 5\' 3"  (1.6 m)   Wt 134 lb 4.2 oz (60.9 kg)   SpO2 97%   BMI 23.78 kg/m   Physical Exam  Constitutional: She appears well-developed and well-nourished. No distress.  Cardiovascular: Normal rate, regular rhythm and normal heart sounds.  No murmur heard. Pulmonary/Chest: Effort normal and breath sounds normal. She has no wheezes.  Neurological:  Alert, awake, and oriented x3 . Stable comprehension, demonstrates mixed expressive and receptive aphasia.  PERRL. EOMS intact without nystagmus or subjective diplopia. Visual fields no assessed. No facial asymmetry. Tongue midline. Motor power symmetric proportional to effort. No pronator drift. Fine motor and coordination symmetric. Gait not assessed. Romberg not assessed. Heel to toe not assessed. Distal pulses 2+ bilaterally.   Skin: Skin is warm and dry.  Right groin incision soft without hematoma or active bleeding.  Psychiatric: She has a normal mood and affect. Her behavior is normal. Judgment and thought content  normal.    Imaging: Ct Abdomen Pelvis Wo Contrast  Result Date: 12/08/2017 CLINICAL DATA:  57 year old female with history of diabetes and cholecystectomy. Evaluate for intra-abdominal hemorrhage. EXAM: CT ABDOMEN AND PELVIS WITHOUT CONTRAST TECHNIQUE: Multidetector CT imaging of the abdomen and pelvis was performed following the standard protocol without IV contrast. COMPARISON:  None. FINDINGS: Evaluation of this exam is limited in the absence of intravenous contrast. Lower chest: Left lung base subpleural atelectasis/scarring. There is hypoattenuation of the cardiac blood pool suggestive of a degree of anemia. Clinical correlation is recommended. Coronary vascular calcification primarily involving the LAD. No intra-abdominal free air or free fluid. Hepatobiliary: Diffuse fatty liver. No intrahepatic biliary ductal dilatation. Cholecystectomy. Pancreas: Unremarkable. No pancreatic ductal dilatation or surrounding inflammatory changes. Spleen: Normal in size without focal abnormality. Adrenals/Urinary Tract: The right adrenal gland is unremarkable. There is a 3 cm indeterminate left adrenal nodule with small peripheral calcification. This nodule is not well characterized. Further evaluation with MRI is recommended. There is a 3 mm nonobstructing stone in the inferior pole of the left kidney. No hydronephrosis. There is no hydronephrosis or nephrolithiasis on the right. The visualized ureters appear unremarkable. The urinary bladder is decompressed around a Foley catheter. Stomach/Bowel: There is no bowel obstruction or active inflammation. Normal appendix. Vascular/Lymphatic: Mild aortoiliac atherosclerotic disease. No portal venous gas. There is no adenopathy. Reproductive: The uterus and ovaries are grossly unremarkable as visualized. Other: Postprocedure changes with small pockets of air and stranding in the subcutaneous soft tissues of the right groin associated with skin incision. There is no drainable  fluid collection/abscess. No hematoma. Musculoskeletal: No acute or significant osseous findings. IMPRESSION: 1. No acute intra-abdominal or pelvic pathology. No fluid collection or hematoma as clinically concern. 2. Findings of anemia. 3. Fatty liver. 4. No bowel obstruction or active inflammation.  Normal appendix. 5. Small nonobstructing left renal calculus.  No hydronephrosis. 6. Post residual changes of the right groin as described. No drainable fluid collection or hematoma. Electronically Signed   By: Elgie Collard M.D.   On: 12/08/2017 06:08   Ct Angio Head W Or Wo Contrast  Result Date: 12/06/2017 CLINICAL DATA:  Expressive aphasia.  Right facial droop. EXAM: CT ANGIOGRAPHY HEAD AND NECK TECHNIQUE: Multidetector CT imaging of the head and neck was performed using the standard protocol during bolus administration of intravenous contrast. Multiplanar CT image reconstructions and MIPs were obtained to evaluate the vascular anatomy. Carotid stenosis measurements (when applicable) are obtained utilizing NASCET criteria, using the distal internal carotid diameter as the denominator. CONTRAST:  50mL ISOVUE-370 IOPAMIDOL (ISOVUE-370) INJECTION 76% COMPARISON:  None. FINDINGS: CTA NECK FINDINGS Aortic arch: Standard 3 vessel aortic arch. Widely patent brachiocephalic and subclavian arteries. Right carotid system: Patent without evidence of stenosis or dissection. Minimal atherosclerotic plaque at the carotid bifurcation. Left carotid system: Patent without evidence of stenosis or dissection. Minimal calcified plaque at the carotid bifurcation. Vertebral arteries: Patent and codominant without evidence of stenosis or dissection. Calcified nonstenotic plaque at the right vertebral artery origin. Skeleton:  C4-C6 ACDF.  Advanced right facet arthrosis at C3-4. Other neck: No mass or enlarged lymph nodes. Upper chest: Clear lung apices. Review of the MIP images confirms the above findings CTA HEAD FINDINGS Anterior  circulation: The internal carotid arteries are patent from skull base to carotid termini with minimal non-stenotic plaque on the left. M1 segments and MCA bifurcations are widely patent. There is occlusion of a mid to distal left M2 branch near the posterior insula. ACAs are patent without significant stenosis. No aneurysm or vascular malformation. Posterior circulation: The intracranial vertebral arteries are widely patent to the basilar. Patent PICA and SCA origins are identified bilaterally. The basilar artery is widely patent. There are left larger than right posterior communicating arteries. The PCAs are patent without evidence of significant stenosis. No aneurysm or vascular malformation. Venous sinuses: As permitted by contrast timing, patent. Anatomic variants: None. Delayed phase: Not performed. Review of the MIP images confirms the above findings IMPRESSION: 1. Mid to distal left M2 branch occlusion. 2. No large vessel occlusion or significant proximal stenosis in the head or neck. These results were communicated to Dr. Laurence Slate at 1:48 pm on 12/06/2017 by text page via the [facility] messaging system. Electronically Signed   By: Sebastian Ache M.D.   On: 12/06/2017 14:13   Ct Head Wo Contrast  Result Date: 12/07/2017 CLINICAL DATA:  Follow-up stroke. EXAM: CT HEAD WITHOUT CONTRAST TECHNIQUE: Contiguous axial images were obtained from the base of the skull through the vertex without intravenous contrast. COMPARISON:  Head CT and CTA from yesterday FINDINGS: Brain: Cytotoxic edema involving posterior left frontal and parietal lobes extending to the level of the insula. The infarct correlates with the M2 occlusion by CTA. Small volume high-density within the left sylvian fissure, diminished from intra procedural head CT at time of catheter angiography. No new distribution infarct is seen. No hydrocephalus Vascular: No visible/central high-density vessel. Skull: No acute or aggressive finding Sinuses/Orbits:  Negative IMPRESSION: Moderate left frontal parietal acute infarct correlating with M2 occlusion seen yesterday. Contrast and or hemorrhage in the left sylvian fissure has diminished from intraprocedural head CT. Electronically Signed   By: Marnee Spring M.D.   On: 12/07/2017 08:11   Ct Angio Neck W Or Wo Contrast  Result Date: 12/06/2017 CLINICAL DATA:  Expressive aphasia.  Right facial droop. EXAM: CT ANGIOGRAPHY HEAD AND NECK TECHNIQUE: Multidetector CT imaging of the head and neck was performed using the standard protocol during bolus administration of intravenous contrast. Multiplanar CT image reconstructions and MIPs were obtained to evaluate the vascular anatomy. Carotid stenosis measurements (when applicable) are obtained utilizing NASCET criteria, using the distal internal carotid diameter as the denominator. CONTRAST:  50mL ISOVUE-370 IOPAMIDOL (ISOVUE-370) INJECTION 76% COMPARISON:  None. FINDINGS: CTA NECK FINDINGS Aortic arch: Standard 3 vessel aortic arch. Widely patent brachiocephalic and subclavian arteries. Right carotid system: Patent without evidence of stenosis or dissection. Minimal atherosclerotic plaque at the carotid bifurcation. Left carotid system: Patent without evidence of stenosis or dissection. Minimal calcified plaque at the carotid bifurcation. Vertebral arteries: Patent and codominant without evidence of stenosis or dissection. Calcified nonstenotic plaque at the right vertebral artery origin. Skeleton: C4-C6 ACDF.  Advanced right facet arthrosis at C3-4. Other neck: No mass or enlarged lymph nodes. Upper chest: Clear lung apices. Review of the MIP images confirms the above findings CTA HEAD FINDINGS Anterior circulation: The internal carotid arteries are patent from skull base to carotid termini with minimal non-stenotic plaque on the left. M1 segments and MCA bifurcations  are widely patent. There is occlusion of a mid to distal left M2 branch near the posterior insula. ACAs  are patent without significant stenosis. No aneurysm or vascular malformation. Posterior circulation: The intracranial vertebral arteries are widely patent to the basilar. Patent PICA and SCA origins are identified bilaterally. The basilar artery is widely patent. There are left larger than right posterior communicating arteries. The PCAs are patent without evidence of significant stenosis. No aneurysm or vascular malformation. Venous sinuses: As permitted by contrast timing, patent. Anatomic variants: None. Delayed phase: Not performed. Review of the MIP images confirms the above findings IMPRESSION: 1. Mid to distal left M2 branch occlusion. 2. No large vessel occlusion or significant proximal stenosis in the head or neck. These results were communicated to Dr. Laurence Slate at 1:48 pm on 12/06/2017 by text page via the [facility] messaging system. Electronically Signed   By: Sebastian Ache M.D.   On: 12/06/2017 14:13   Mr Brain Wo Contrast  Result Date: 12/07/2017 CLINICAL DATA:  Stroke workup. EXAM: MRI HEAD WITHOUT CONTRAST TECHNIQUE: Multiplanar, multiecho pulse sequences of the brain and surrounding structures were obtained without intravenous contrast. COMPARISON:  Head CT from earlier today. FINDINGS: Brain: Acute left MCA branch infarct affecting a moderate to large area following the sylvian fissure from the insula to the parietal lobe. There is generalized petechial hemorrhage. Intermittent sulcal FLAIR hyperintensity is likely from intravascular slow flow. Subarachnoid high-density at the level of the insula as described on head CT earlier today. The remainder of the brain appears normal. No pre-existing infarct or hemorrhage. No midline shift or entrapment. Vascular: Proximal flow voids are preserved Skull and upper cervical spine: No evidence of marrow lesion. Sinuses/Orbits: Negative IMPRESSION: 1. Acute left MCA branch infarct correlating with M2 occlusion seen by CTA yesterday. Generalized petechial  hemorrhage. 2. The remainder the brain has a normal appearance. Electronically Signed   By: Marnee Spring M.D.   On: 12/07/2017 15:39   Ir Ct Head Ltd  Result Date: 12/09/2017 INDICATION: Global aphasia.  Mild right-sided weakness. EXAM: 1. EMERGENT LARGE VESSEL OCCLUSION THROMBOLYSIS anterior CIRCULATION) COMPARISON:  CT angiogram of the head and neck of 12/06/2017. MEDICATIONS: Ancef 2 g IV antibiotic was administered within 1 hour of the procedure. ANESTHESIA/SEDATION: General anesthesia. CONTRAST:  Isovue 300 approximately 110 mL. FLUOROSCOPY TIME:  Fluoroscopy Time: 26 minutes 30 seconds (2586 mGy). COMPLICATIONS: None immediate. TECHNIQUE: Following a full explanation of the procedure along with the potential associated complications, an informed witnessed consent was obtained from patient's husband. The risks of intracranial hemorrhage of 10%, worsening neurological deficit, ventilator dependency, death and inability to revascularize were all reviewed in detail with the patient's husband. The patient was then put under general anesthesia by the Department of Anesthesiology at [facility]. The right groin was prepped and draped in the usual sterile fashion. Thereafter using modified Seldinger technique, transfemoral access into the right common femoral artery was obtained without difficulty. Over a 0.035 inch guidewire a 5 French Pinnacle sheath was inserted. Through this, and also over a 0.035 inch guidewire a 5 [place] JB 1 catheter was advanced to the aortic arch region and selectively positioned in the in the left common carotid artery. An arteriogram was then performed centered extra cranially and intracranially. FINDINGS: The left common carotid arteriogram demonstrates the left external carotid artery and its major branches to be widely patent. The left internal carotid artery at the bulb to the cranial skull base opacifies normally. There is mild to moderate tortuosity  of the junction of the  middle and the proximal 1/3 of the left internal carotid artery. More distally the petrous, the cavernous and the supraclinoid segments demonstrate wide patency. A dominant left posterior communicating artery is seen to opacify the left posterior cerebral artery distribution. The left middle cerebral artery and the left anterior cerebral artery opacify into the capillary and venous phases. On the lateral projection, there is truncation and occlusion of the distal M2 inferior division with slight flow into the distal M3 branches. A moderate-sized area of hypoperfusion is noted in the capillary phase in the posterior parietal cortical and subcortical region. PROCEDURE: ENDOVASCULAR REVASCULARIZATION OF THE OCCLUDED DISTAL M2 BRANCH OF THE INFERIOR DIVISION OF THE LEFT MIDDLE CEREBRAL ARTERY WITH 1 PASS WITH THE SOLITAIRE FR 4 MM X 40 MM RETRIEVAL DEVICE, AND 6 MG OF SUPER SELECTIVE INTRACRANIAL INTRA-ARTERIAL INTEGRILIN, WITH ACHIEVEMENT OF TICI 2B REPERFUSION. The diagnostic JB 1 catheter in the left common carotid artery was exchanged over a 0.035 inch 300 cm Rosen exchange guidewire for an 8 French 55 cm Brite tip neurovascular sheath. Good aspiration obtained from the side port of the neurovascular sheath. Gentle contrast injection demonstrated no evidence of spasms, dissections or of intraluminal filling defects. This was then connected to continuous heparinized saline infusion. Over the exchange 300 cm Rosen guidewire, an 85 cm FlowGate balloon catheter was advanced to the distal end of the right common carotid artery. The guidewire was removed. The [facility] guide catheter had been prepped with 50% contrast and 50% heparinized saline infusion at its balloon end. A gentle contrast injection through the [facility] guide catheter demonstrated no evidence of spasms, dissections or of intraluminal filling defects. Over a 0.035 inch Roadrunner guidewire, using biplane roadmap technique and constant fluoroscopic  guidance, the FlowGate guide catheter was advanced to the junction of the middle and the proximal 1/3 of the left internal carotid artery. The guidewire was removed. Good aspiration obtained from the hub of the [facility] guide catheter. A gentle contrast junction demonstrated no evidence of spasms, dissections or of intraluminal filling defects. Over a 0.014 inch Softip Synchro micro guidewire, the combination of the Trevo ProVue 021 microcatheter and a Sofia 132 cm 5 [place] guide catheter was advanced to the distal end of the [facility] guide catheter. With the micro guidewire leading with a J-tip configuration, the combination was navigated without difficulty to the supraclinoid left ICA. The micro guidewire was then gently advanced into the middle cerebral artery followed by the microcatheter. The guidewire was advanced without difficulty through the occluded distal inferior division M2 M3 region followed by the microcatheter. The guidewire was removed. There was resistance to contrast from the tip of the micro microcatheter. This was then gently retrieved more proximally until free aspiration of blood was noted at the hub of the microcatheter. A gentle control arteriogram demonstrated slow antegrade flow distal to the microcatheter tip. A control arteriogram performed through the 5 [place] guide catheter in the left ICA supraclinoid segment demonstrated the occluded segment of the inferior division. This time a Solitaire FR 4 mm x 40 mm retrieval device was advanced in a coaxial manner and with constant heparinized saline infusion using biplane roadmap technique and constant fluoroscopic guidance to the distal end of the microcatheter. The O ring on the delivery microcatheter was then loosened. With gentle traction with the right hand on the delivery micro guidewire, with the left hand the delivery microcatheter was retrieved unsheathing the retrieval device. A control arteriogram performed through the  5  [place] guide catheter demonstrated partial recanalization of the previously occluded inferior division. Slow flow was noted in the distal portion of the retrieval device. The proximal portion of the retrieval device was then captured into the microcatheter. Proximal flow arrest was then initiated by inflating the balloon of the [facility] guide catheter in the left internal carotid artery. With constant aspiration being applied was a 60 mL syringe at the hub of the [facility] guide catheter in the left internal carotid artery, and with a 25 mL syringe at the hub of the [place] guide catheter, the combination of the retrieval device and the microcatheter were retrieved and removed as constant aspiration was being applied. Aspiration was continued as the balloon was deflated in the left internal carotid artery for reversal flow arrest. A control arteriogram performed through the [facility] guide catheter after having established free back bleed at the hub, demonstrated a significantly improved flow through the previously occluded distal inferior division M2 M3 region of the left middle cerebral artery. There continued to be slow flow distally in the M3 M4 region. This prompted the use of approximately 6 mg of super selective intracranial intra-arterial Integrilin. Also performed was mechanical thrombolysis using a micro guidewire through the more distal portion of the clot through super selective positioning of the microcatheter in the angular branch of the inferior division of the middle cerebral artery. A final control arteriogram performed through the 8 [place] FlowGate guide catheter in the left internal carotid artery demonstrated significantly improved caliber and flow through the previously occluded inferior division M2 M3 region of the left middle cerebral artery with improved distal perfusion. Following the thrombectomy, there was significant spasm noted in the more proximal portion of the left middle cerebral artery.  This responded to 3 aliquots of 25 mcg of intra-arterial nitroglycerin. Throughout the procedure, the patient's blood pressure and neurological status remained stable. No angiographic evidence of mass effect, midline shift or of extravasation was noted. The 8 [place] FlowGate guide catheter, and the 8 [place] neurovascular sheath were then retrieved into the abdominal aorta and exchanged over a J-tip guidewire for an 8 [place] Pinnacle sheath. Attempts at the placement of an 8 French Angio-Seal were unsuccessful due to the kinking at the distal end of the device. This was then exchanged for an 8 [place] Pinnacle sheath over a wire. Angiograms were performed of the right groin which were demonstrated free flow into the distal vasculature in the superficial and the Profunda arteries on the right side. A Dyna CT of the brain performed at the end of procedure demonstrated no gross mass effect, midline shift or ventricular compromise. There was a focal area of a small hyper density noted in the posterior perisylvian region which progressed in a mixture of contrast with the a small amount of focal subarachnoid hemorrhage. The patient's general anesthesia was then reversed and the patient was then extubated without difficulty. Upon recovery the patient demonstrated slow recovery in terms of her level of consciousness. She was spontaneously moving her lower extremities and the right upper extremity. Showed only slight movement of the left upper extremity. Her right groin appeared soft without evidence of bleeding or hematoma. There was free flow through the arterial flash line in the right groin. Distal pulses remained palpable in both feet. The patient was then transferred to the PACU and then to neuro ICU for further management. IMPRESSION: Status post endovascular near complete revascularization of the occluded distal inferior division of the left middle cerebral artery in  the M2 M3 region, with 1 pass using the Solitaire FR  4 mm x 40 mm retrieval device, and approximately 6 mg of super selective intracranial Integrilin and mechanical thrombolysis achieving a TICI 2b revascularization. PLAN: Follow-up in 4-6 weeks post discharge from the hospital. Electronically Signed   By: Julieanne Cotton M.D.   On: 12/08/2017 10:50   Dg Swallowing Func-speech Pathology  Result Date: 12/09/2017 Objective Swallowing Evaluation: Type of Study: MBS-Modified Barium Swallow Study  Patient Details Name: TIARNA KOPPEN MRN: [id number] Date of Birth: [birth year] Today's Date: 12/09/2017 Time: SLP Start Time (ACUTE ONLY): 1034 -SLP Stop Time (ACUTE ONLY): 1053 SLP Time Calculation (min) (ACUTE ONLY): 19 min Past Medical History: Past Medical History: Diagnosis Date . Depression  . Diabetes mellitus without complication (HCC)  . Fibromyalgia  . High cholesterol  Past Surgical History: Past Surgical History: Procedure Laterality Date . CHOLECYSTECTOMY   . IR CT HEAD LTD  12/06/2017 . IR PERCUTANEOUS ART THROMBECTOMY/INFUSION INTRACRANIAL INC DIAG ANGIO  12/06/2017 . RADIOLOGY WITH ANESTHESIA N/A 12/06/2017  Procedure: CODE STROKE;  Surgeon: Julieanne Cotton, MD;  Location: [facility] OR;  Service: Radiology;  Laterality: N/A; . SPINE SURGERY   . THROMBECTOMY FEMORAL ARTERY Right 12/07/2017  Procedure: THROMBECTOMY RIGHT FEMORAL ARTERY;  Surgeon: Maeola Harman, MD;  Location: [facility] OR;  Service: Vascular;  Laterality: Right; . 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  Subjective: Pt alert and cooperative to assessment Assessment / Plan / Recommendation CHL IP CLINICAL IMPRESSIONS 12/09/2017 Clinical Impression Pt presents with inconsistent, minimal flash penetration with consecutive sips of thin liquid; however, laryngeal vestibule was clear of all penetrates following swallow completion. Min pharyngeal  residue observed with liquids, likely related to ACDF hardware impacting UES opening, with all other consistencies consumed without residue. Oral phase was overtly functional for all trials. Of note, pt needed additional puree bolus to clear the barium pill from her esophagus, potentially indicating esophageal issues. Given pt's current swallow function and ability to protect airway despite challenging, recommend initiating regular solids and thin liquids diet with aspiration and esophageal precautions (sitting upright, remaining upright at least 30 min after PO intake, and slow/small bites). Medications administered whole in puree. No SLP follow up warranted for dysphagia. SLP will continue to follow for pt's communication goals.  SLP Visit Diagnosis Dysphagia, unspecified (R13.10) Attention and concentration deficit following -- Frontal lobe and executive function deficit following -- Impact on safety and function Mild aspiration risk   CHL IP TREATMENT RECOMMENDATION 12/09/2017 Treatment Recommendations Therapy as outlined in treatment plan below   Prognosis 12/08/2017 Prognosis for Safe Diet Advancement Good Barriers to Reach Goals -- Barriers/Prognosis Comment -- CHL IP DIET RECOMMENDATION 12/09/2017 SLP Diet Recommendations Regular solids;Thin liquid Liquid Administration via Straw;Cup Medication Administration Whole meds with puree Compensations Slow rate;Small sips/bites Postural Changes Remain semi-upright after after feeds/meals (Comment);Seated upright at 90 degrees   CHL IP OTHER RECOMMENDATIONS 12/09/2017 Recommended Consults Consider esophageal assessment Oral Care Recommendations Oral care BID Other Recommendations --   CHL IP FOLLOW UP RECOMMENDATIONS 12/09/2017 Follow up Recommendations Inpatient Rehab   CHL IP FREQUENCY AND DURATION 12/09/2017 Speech Therapy Frequency (ACUTE ONLY) min 2x/week Treatment Duration 2 weeks      CHL IP ORAL PHASE 12/09/2017 Oral Phase WFL Oral - Pudding Teaspoon -- Oral - Pudding Cup  -- Oral - Honey Teaspoon --  Oral - Honey Cup -- Oral - Nectar Teaspoon -- Oral - Nectar Cup -- Oral - Nectar Straw -- Oral - Thin Teaspoon -- Oral - Thin Cup -- Oral - Thin Straw -- Oral - Puree -- Oral - Mech Soft -- Oral - Regular -- Oral - Multi-Consistency -- Oral - Pill -- Oral Phase - Comment --  CHL IP PHARYNGEAL PHASE 12/09/2017 Pharyngeal Phase WFL Pharyngeal- Pudding Teaspoon -- Pharyngeal -- Pharyngeal- Pudding Cup -- Pharyngeal -- Pharyngeal- Honey Teaspoon -- Pharyngeal -- Pharyngeal- Honey Cup -- Pharyngeal -- Pharyngeal- Nectar Teaspoon -- Pharyngeal -- Pharyngeal- Nectar Cup -- Pharyngeal -- Pharyngeal- Nectar Straw -- Pharyngeal -- Pharyngeal- Thin Teaspoon -- Pharyngeal -- Pharyngeal- Thin Cup -- Pharyngeal -- Pharyngeal- Thin Straw -- Pharyngeal -- Pharyngeal- Puree -- Pharyngeal -- Pharyngeal- Mechanical Soft -- Pharyngeal -- Pharyngeal- Regular -- Pharyngeal -- Pharyngeal- Multi-consistency -- Pharyngeal -- Pharyngeal- Pill -- Pharyngeal -- Pharyngeal Comment --  CHL IP CERVICAL ESOPHAGEAL PHASE 12/09/2017 Cervical Esophageal Phase Impaired Pudding Teaspoon -- Pudding Cup -- Honey Teaspoon -- Honey Cup -- Nectar Teaspoon -- Nectar Cup -- Nectar Straw -- Thin Teaspoon -- Thin Cup Reduced cricopharyngeal relaxation Thin Straw -- Puree -- Mechanical Soft -- Regular -- Multi-consistency -- Pill -- Cervical Esophageal Comment -- No flowsheet data found. Maxcine Ham 12/09/2017, 2:55 PM  Note populated for [place] Jarrett, Student SLP Maxcine Ham, M.A. CCC-SLP [phone]             Ir Percutaneous Art Thrombectomy/infusion Intracranial Inc Diag Angio  Result Date: 12/09/2017 INDICATION: Global aphasia.  Mild right-sided weakness. EXAM: 1. EMERGENT LARGE VESSEL OCCLUSION THROMBOLYSIS anterior CIRCULATION) COMPARISON:  CT angiogram of the head and neck of 12/06/2017. MEDICATIONS: Ancef 2 g IV antibiotic was administered within 1 hour of the procedure. ANESTHESIA/SEDATION: General anesthesia.  CONTRAST:  Isovue 300 approximately 110 mL. FLUOROSCOPY TIME:  Fluoroscopy Time: 26 minutes 30 seconds (2586 mGy). COMPLICATIONS: None immediate. TECHNIQUE: Following a full explanation of the procedure along with the potential associated complications, an informed witnessed consent was obtained from patient's husband. The risks of intracranial hemorrhage of 10%, worsening neurological deficit, ventilator dependency, death and inability to revascularize were all reviewed in detail with the patient's husband. The patient was then put under general anesthesia by the Department of Anesthesiology at [facility]. The right groin was prepped and draped in the usual sterile fashion. Thereafter using modified Seldinger technique, transfemoral access into the right common femoral artery was obtained without difficulty. Over a 0.035 inch guidewire a 5 French Pinnacle sheath was inserted. Through this, and also over a 0.035 inch guidewire a 5 [place] JB 1 catheter was advanced to the aortic arch region and selectively positioned in the in the left common carotid artery. An arteriogram was then performed centered extra cranially and intracranially. FINDINGS: The left common carotid arteriogram demonstrates the left external carotid artery and its major branches to be widely patent. The left internal carotid artery at the bulb to the cranial skull base opacifies normally. There is mild to moderate tortuosity of the junction of the middle and the proximal 1/3 of the left internal carotid artery. More distally the petrous, the cavernous and the supraclinoid segments demonstrate wide patency. A dominant left posterior communicating artery is seen to opacify the left posterior cerebral artery distribution. The left middle cerebral artery and the left anterior cerebral artery opacify into the capillary and venous phases. On the lateral projection, there is truncation and occlusion of the distal M2 inferior division with slight  flow into the distal M3 branches. A moderate-sized area of hypoperfusion is noted in the capillary phase in the posterior parietal cortical and subcortical region. PROCEDURE: ENDOVASCULAR REVASCULARIZATION OF THE OCCLUDED DISTAL M2 BRANCH OF THE INFERIOR DIVISION OF THE LEFT MIDDLE CEREBRAL ARTERY WITH 1 PASS WITH THE SOLITAIRE FR 4 MM X 40 MM RETRIEVAL DEVICE, AND 6 MG OF SUPER SELECTIVE INTRACRANIAL INTRA-ARTERIAL INTEGRILIN, WITH ACHIEVEMENT OF TICI 2B REPERFUSION. The diagnostic JB 1 catheter in the left common carotid artery was exchanged over a 0.035 inch 300 cm Rosen exchange guidewire for an 8 French 55 cm Brite tip neurovascular sheath. Good aspiration obtained from the side port of the neurovascular sheath. Gentle contrast injection demonstrated no evidence of spasms, dissections or of intraluminal filling defects. This was then connected to continuous heparinized saline infusion. Over the exchange 300 cm Rosen guidewire, an 85 cm FlowGate balloon catheter was advanced to the distal end of the right common carotid artery. The guidewire was removed. The [facility] guide catheter had been prepped with 50% contrast and 50% heparinized saline infusion at its balloon end. A gentle contrast injection through the [facility] guide catheter demonstrated no evidence of spasms, dissections or of intraluminal filling defects. Over a 0.035 inch Roadrunner guidewire, using biplane roadmap technique and constant fluoroscopic guidance, the FlowGate guide catheter was advanced to the junction of the middle and the proximal 1/3 of the left internal carotid artery. The guidewire was removed. Good aspiration obtained from the hub of the [facility] guide catheter. A gentle contrast junction demonstrated no evidence of spasms, dissections or of intraluminal filling defects. Over a 0.014 inch Softip Synchro micro guidewire, the combination of the Trevo ProVue 021 microcatheter and a Sofia 132 cm 5 [place] guide catheter was advanced to  the distal end of the [facility] guide catheter. With the micro guidewire leading with a J-tip configuration, the combination was navigated without difficulty to the supraclinoid left ICA. The micro guidewire was then gently advanced into the middle cerebral artery followed by the microcatheter. The guidewire was advanced without difficulty through the occluded distal inferior division M2 M3 region followed by the microcatheter. The guidewire was removed. There was resistance to contrast from the tip of the micro microcatheter. This was then gently retrieved more proximally until free aspiration of blood was noted at the hub of the microcatheter. A gentle control arteriogram demonstrated slow antegrade flow distal to the microcatheter tip. A control arteriogram performed through the 5 ChadFrench Sofia guide catheter in the left ICA supraclinoid segment demonstrated the occluded segment of the inferior division. This time a Solitaire FR 4 mm x 40 mm retrieval device was advanced in a coaxial manner and with constant heparinized saline infusion using biplane roadmap technique and constant fluoroscopic guidance to the distal end of the microcatheter. The O ring on the delivery microcatheter was then loosened. With gentle traction with the right hand on the delivery micro guidewire, with the left hand the delivery microcatheter was retrieved unsheathing the retrieval device. A control arteriogram performed through the 5 [place] guide catheter demonstrated partial recanalization of the previously occluded inferior division. Slow flow was noted in the distal portion of the retrieval device. The proximal portion of the retrieval device was then captured into the microcatheter. Proximal flow arrest was then initiated by inflating the balloon of the [facility] guide catheter in the left internal carotid artery. With constant aspiration being applied was a 60 mL syringe at the hub of the [facility] guide catheter in the left internal  carotid artery, and with a 25 mL syringe at the hub of the [place] guide catheter, the combination of the retrieval device and the microcatheter were retrieved and removed as constant aspiration was being applied. Aspiration was continued as the balloon was deflated in the left internal carotid artery for reversal flow arrest. A control arteriogram performed through the [facility] guide catheter after having established free back bleed at the hub, demonstrated a significantly improved flow through the previously occluded distal inferior division M2 M3 region of the left middle cerebral artery. There continued to be slow flow distally in the M3 M4 region. This prompted the use of approximately 6 mg of super selective intracranial intra-arterial Integrilin. Also performed was mechanical thrombolysis using a micro guidewire through the more distal portion of the clot through super selective positioning of the microcatheter in the angular branch of the inferior division of the middle cerebral artery. A final control arteriogram performed through the 8 [place] FlowGate guide catheter in the left internal carotid artery demonstrated significantly improved caliber and flow through the previously occluded inferior division M2 M3 region of the left middle cerebral artery with improved distal perfusion. Following the thrombectomy, there was significant spasm noted in the more proximal portion of the left middle cerebral artery. This responded to 3 aliquots of 25 mcg of intra-arterial nitroglycerin. Throughout the procedure, the patient's blood pressure and neurological status remained stable. No angiographic evidence of mass effect, midline shift or of extravasation was noted. The 8 [place] FlowGate guide catheter, and the 8 [place] neurovascular sheath were then retrieved into the abdominal aorta and exchanged over a J-tip guidewire for an 8 [place] Pinnacle sheath. Attempts at the placement of an 8 French Angio-Seal were  unsuccessful due to the kinking at the distal end of the device. This was then exchanged for an 8 [place] Pinnacle sheath over a wire. Angiograms were performed of the right groin which were demonstrated free flow into the distal vasculature in the superficial and the Profunda arteries on the right side. A Dyna CT of the brain performed at the end of procedure demonstrated no gross mass effect, midline shift or ventricular compromise. There was a focal area of a small hyper density noted in the posterior perisylvian region which progressed in a mixture of contrast with the a small amount of focal subarachnoid hemorrhage. The patient's general anesthesia was then reversed and the patient was then extubated without difficulty. Upon recovery the patient demonstrated slow recovery in terms of her level of consciousness. She was spontaneously moving her lower extremities and the right upper extremity. Showed only slight movement of the left upper extremity. Her right groin appeared soft without evidence of bleeding or hematoma. There was free flow through the arterial flash line in the right groin. Distal pulses remained palpable in both feet. The patient was then transferred to the PACU and then to neuro ICU for further management. IMPRESSION: Status post endovascular near complete revascularization of the occluded distal inferior division of the left middle cerebral artery in the M2 M3 region, with 1 pass using the Solitaire FR 4 mm x 40 mm retrieval device, and approximately 6 mg of super selective intracranial Integrilin and mechanical thrombolysis achieving a TICI 2b revascularization. PLAN: Follow-up in 4-6 weeks post discharge from the hospital. Electronically Signed   By: Julieanne Cotton M.D.   On: 12/08/2017 10:50   Ct Head Code Stroke Wo Contrast  Result Date: 12/06/2017 CLINICAL DATA:  Code stroke. Expressive aphasia and right facial droop.  EXAM: CT HEAD WITHOUT CONTRAST TECHNIQUE: Contiguous axial  images were obtained from the base of the skull through the vertex without intravenous contrast. COMPARISON:  Brain MRI 09/11/2016 FINDINGS: Brain: There is focal hypoattenuation involving the posterior left insula likely reflecting an acute infarct. No acute cortical infarction is identified elsewhere, and there is no evidence of acute basal ganglia infarct. No intracranial hemorrhage, mass, midline shift, or extra-axial fluid collection is identified. There may be a tiny chronic lacunar infarct in the left lentiform nucleus versus a dilated perivascular space. The ventricles and sulci are normal. Vascular: Calcified atherosclerosis at the skull base. Hyperattenuation of a mid to distal M2 branch vessel in the left sylvian fissure. Skull: No fracture focal osseous lesion. Sinuses/Orbits: Mild right posterior ethmoid air cell mucosal thickening. Clear mastoid air cells. Unremarkable orbits. Other: None. ASPECTS [facility] Stroke Program Early CT Score) - Ganglionic level infarction (caudate, lentiform nuclei, internal capsule, insula, M1-M3 cortex): 6 - Supraganglionic infarction (M4-M6 cortex): 3 Total score (0-10 with 10 being normal): 9 IMPRESSION: 1. 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 No hemorrhage. 2. ASPECTS is 9. 3. Suspected embolus in a mid to distal left M2 branch vessel. These results were communicated to Dr. Laurence Slate at 1:48 pm on 12/06/2017 by text page via the [facility] messaging system. Electronically Signed   By: Sebastian Ache M.D.   On: 12/06/2017 13:49    Labs:  CBC: Recent Labs    12/08/17 0546 12/08/17 1039 12/09/17 0401 12/10/17 0328  WBC 17.2* 16.5* 11.2* 7.6  HGB 11.2* 10.1* 11.2* 10.7*  HCT 32.7* 28.9* 34.3* 32.4*  PLT 149* 152 122* 146*    COAGS: Recent Labs    12/06/17 1300  INR 0.90  APTT 25    BMP: Recent Labs    12/07/17 0443 12/08/17 0546 12/09/17 0401 12/10/17 0328  NA 136 139 142 140  K 3.7 3.7 3.4* 3.0*  CL 108 111 111 107  CO2 19* 18* 21* 25    GLUCOSE 345* 175* 118* 123*  BUN 7 9 13  5*  CALCIUM 7.9* 8.1* 8.1* 8.1*  CREATININE 0.51 0.48 0.50 0.45  GFRNONAA >60 >60 >60 >60  GFRAA >60 >60 >60 >60    LIVER FUNCTION TESTS: Recent Labs    05/17/17 1119 07/30/17 1803 12/06/17 1300  BILITOT 0.5 0.4 0.5  AST 16 14 19   ALT 13 13 17   ALKPHOS 150* 154* 154*  PROT 6.9 7.0 6.8  ALBUMIN 4.4 4.3 3.7    Assessment and Plan:  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 Continued demonstration of mixed expressive and receptive aphasia, remains stable from yesterday. Continue Aspirin 325 mg once daily. Groin stable. Appreciate and agree with Neurology management.  Electronically Signed: Elwin Mocha, PA-C 12/10/2017, 10:21 AM   I spent a total of 20 minutes at the the patient's bedside AND on the patient's hospital floor or unit, greater than 50% of which was counseling/coordinating care for left MCA stenosis s/p revascularization.

## 2017-12-10 NOTE — Progress Notes (Signed)
OT Cancellation Note  Patient Details Name: Brittany [name] MRN: [id number] DOB: [birth year]   Cancelled Treatment:    Reason Eval/Treat Not Completed: Patient at procedure or test/ unavailable.  Will reattempt.  [place] Nordquist [place], OTR/L [phone]   Jeani HawkingConarpe, Joda Braatz M 12/10/2017, 1:22 PM

## 2017-12-10 NOTE — Consult Note (Signed)
Physical Medicine and Rehabilitation Consult Reason for Consult: Decreased functional mobility with aphasia Referring Physician: Dr. Pearlean Brownie   HPI: Brittany [name] is a 57 y.o. right-handed female with history of hypertension, diabetes mellitus, fibromyalgia, tobacco abuse, CVA maintained on aspirin 325 mg daily.  Per chart review patient lives with spouse independent prior to admission presented 12/06/2017 with acute onset of aphasia.  CT of the head showed acute left insular infarct with suspected embolus and distal left M2.  TPA was administered.  CT Angie of head and neck showed no large vessel occlusion or significant proximal stenosis in the head and neck.  Echocardiogram with ejection fraction of 60% grade 2 diastolic dysfunction.  Underwent mechanical thrombectomy M2 branch of left MCA per interventional radiology.  Later that evening after endovascular salvage reported right lower extremity weakness with absence of pulses and ischemic changes.  Underwent exploration right lower extremity per vascular surgery requiring thromboembolectomy repair of right profunda femoris arteriotomy and femoral vein venotomy per Dr. Randie Heinz.  ABI follow-ups were normal.  Neurology follow-up currently on aspirin for CVA prophylaxis.  Subtends Lovenox for DVT prophylaxis.  Await plan for possible TEE/loop.  Therapy evaluations completed with recommendations of physical medicine rehab consult. She was active and working part-time prior to her stroke.  Her spouse is retired but looks after the patient's father who has Alzheimer's dementia  Review of Systems  Unable to perform ROS: Language   Past Medical History:  Diagnosis Date  . Depression   . Diabetes mellitus without complication (HCC)   . Fibromyalgia   . High cholesterol    Past Surgical History:  Procedure Laterality Date  . CHOLECYSTECTOMY    . IR CT HEAD LTD  12/06/2017  . IR PERCUTANEOUS ART THROMBECTOMY/INFUSION INTRACRANIAL INC DIAG ANGIO   12/06/2017  . RADIOLOGY WITH ANESTHESIA N/A 12/06/2017   Procedure: CODE STROKE;  Surgeon: Julieanne Cotton, MD;  Location: [facility] OR;  Service: Radiology;  Laterality: N/A;  . SPINE SURGERY    . THROMBECTOMY FEMORAL ARTERY Right 12/07/2017   Procedure: THROMBECTOMY RIGHT FEMORAL ARTERY;  Surgeon: Maeola Harman, MD;  Location: [facility] OR;  Service: Vascular;  Laterality: Right;  . TONSILLECTOMY     Family History  Problem Relation Age of Onset  . Cancer Mother   . Diabetes Mother   . Heart disease Mother   . Parkinsonism Mother   . Diabetes Father   . Heart disease Father   . Dementia Father   . Cancer Maternal Grandmother 62       lung  . COPD Maternal Aunt    Social History:  reports that she has been smoking cigarettes.  She has a 2.00 pack-year smoking history. She has never used smokeless tobacco. She reports that she drinks alcohol. She reports that she does not use drugs. Allergies: No Known Allergies Medications Prior to Admission  Medication Sig Dispense Refill  . albuterol (PROVENTIL) (2.5 MG/3ML) 0.083% nebulizer solution Take 3 mLs (2.5 mg total) by nebulization every 6 (six) hours as needed for wheezing or shortness of breath. (Patient not taking: Reported on 12/07/2017) 150 mL 1  . Albuterol Sulfate (PROAIR RESPICLICK) 108 (90 Base) MCG/ACT AEPB Inhale 2 puffs into the lungs every 4 (four) hours as needed. (Patient not taking: Reported on 12/07/2017) 1 each 2  . aspirin 325 MG tablet Take 325 mg by mouth daily.    Marland Kitchen atorvastatin (LIPITOR) 40 MG tablet Take 1 tablet (40 mg total) by mouth daily. 90  tablet 3  . B Complex-Biotin-FA (MULTI-B COMPLEX) CAPS Take one a day ,B complex  multivitamin over the counter or use prenatal vitamin. (Patient not taking: Reported on 12/07/2017) 90 capsule 0  . BD PEN NEEDLE NANO U/F 32G X 4 MM MISC USE WITH VICTOZA PEN 100 each 11  . Canagliflozin-Metformin HCl ER (INVOKAMET XR) 50-1000 MG TB24 Take 2 tablets by mouth daily. 180 tablet 1  .  estradiol (ESTRACE) 0.1 MG/GM vaginal cream Apply to perineum at night x 2 weeks, then every other night. (Patient taking differently: See admin instructions. Filled 07/30/17: Apply to perineum at night x 2 weeks, then every other night.) 42.5 g 3  . liraglutide (VICTOZA) 18 MG/3ML SOPN Inject 0.3 mLs (1.8 mg total) into the skin daily. 10 pen 1  . losartan (COZAAR) 50 MG tablet Take 1 tablet (50 mg total) by mouth daily. 90 tablet 3  . metFORMIN (GLUCOPHAGE) 500 MG tablet Take 500 mg by mouth 2 (two) times daily with a meal.     . primidone (MYSOLINE) 250 MG tablet Take 1 tablet (250 mg total) by mouth at bedtime. 90 tablet 3  . sertraline (ZOLOFT) 100 MG tablet Take 2 tablets (200 mg total) by mouth at bedtime. 180 tablet 3  . sitaGLIPtin (JANUVIA) 100 MG tablet Take 1 tablet (100 mg total) by mouth daily. 90 tablet 1    Home: Home Living Family/patient expects to be discharged to:: Private residence Living Arrangements: Spouse/significant other Additional Comments: pt unable to provide with aphasia and no family present  Lives With: Spouse  Functional History: Prior Function Level of Independence: Independent Comments: Pt reportign she was performing ADLs, IADLs, and "working a job". When asked about work, pt attempting to discribe job with diffiulty. Pt able to say "my babies" and then begand to tear up. Functional Status:  Mobility: Bed Mobility Overal bed mobility: Needs Assistance Bed Mobility: Supine to Sit Supine to sit: Supervision General bed mobility comments: gestural commands to initiate and complete transfer Transfers Overall transfer level: Needs assistance Transfers: Sit to/from Stand Sit to Stand: Min assist, +2 safety/equipment General transfer comment: min assist to stand for balance with partial LOB on initial standing and pt seems to endorse slight dizziness without hypotension Ambulation/Gait Ambulation/Gait assistance: Min assist Ambulation Distance (Feet): 80  Feet Assistive device: 1 person hand held assist Gait Pattern/deviations: Step-through pattern, Decreased stride length General Gait Details: pt with support of trunk against P.T. trunk without UE support able to walk with slow steady gait, with truncal support released 2 partial buckling episodes with min assist to recover.  Gait velocity interpretation: Below normal speed for age/gender    ADL: ADL Overall ADL's : Needs assistance/impaired Grooming: Wash/dry face, Sitting, Minimal assistance Grooming Details (indicate cue type and reason): Pt requiring Min A to guide RUE towards grooming items. Pt able to bring tissue to face. Demonstrating decreased grasp strength and difficulty pulling tissue out of box.  Pt with visual difficits as seen by reaching out towards left side of tissue box.  Upper Body Bathing: Min guard, Sitting Lower Body Bathing: Moderate assistance, Sit to/from stand, Cueing for sequencing Upper Body Dressing : Min guard, Cueing for sequencing, Sitting Lower Body Dressing: Moderate assistance, Sit to/from stand, Cueing for sequencing Lower Body Dressing Details (indicate cue type and reason): Pt donning socks at EOB with increased time and demosntrating decreased problem solving. Pt requiring increased time to relize she was holding toe end of socks while pulling sock opening over her  toes. Mod A for balance during dynamic balance Toilet Transfer: Minimal assistance, Ambulation, Cueing for safety, Cueing for sequencing(Simulated to recliner. ) Functional mobility during ADLs: Minimal assistance, Cueing for sequencing(Hand held A) General ADL Comments: Pt presenting with decreased funcitonal performance and is impacted by poor balance, cognition, vision, and aphasia.   Cognition: Cognition Overall Cognitive Status: Difficult to assess(due to severity of dysphagia) Orientation Level: Other (comment)(aphasia) Cognition Arousal/Alertness: Awake/alert Behavior During  Therapy: Flat affect Overall Cognitive Status: Difficult to assess(due to severity of dysphagia) General Comments: pt able to follow gestural commands relatively well and does moderately well with verbal combined with visual command. Requiring increased time and cues throughout Difficult to assess due to: Impaired communication  Blood pressure 134/63, pulse 73, temperature 98.5 F (36.9 C), temperature source Oral, resp. rate 19, height 5\' 3"  (1.6 m), weight 60.9 kg (134 lb 4.2 oz), SpO2 95 %. Physical Exam  Vitals reviewed. HENT:  Head: Normocephalic.  Eyes: EOM are normal.  Neck: Normal range of motion. Neck supple. No thyromegaly present.  Cardiovascular:  Cardiac rate controlled  Respiratory: Effort normal and breath sounds normal. No respiratory distress.  GI: Soft. Bowel sounds are normal. She exhibits no distension.  Neurological: She is alert.  Makes eye contact with examiner.  Warnicke's type aphasia.  She did not follow commands  Motor strength is 5/5 bilateral deltoid bicep tricep grip hip flexion extension ankle dorsiflexion Patient is unreliable with yes/no, unable to repeat, fluent aphasia,  Results for orders placed or performed during the hospital encounter of 12/06/17 (from the past 24 hour(s))  Glucose, capillary     Status: Abnormal   Collection Time: 12/09/17  8:13 AM  Result Value Ref Range   Glucose-Capillary 151 (H) 65 - 99 mg/dL   Comment 1 Notify RN    Comment 2 Document in Chart   Glucose, capillary     Status: Abnormal   Collection Time: 12/09/17 11:41 AM  Result Value Ref Range   Glucose-Capillary 149 (H) 65 - 99 mg/dL   Comment 1 Notify RN    Comment 2 Document in Chart   Glucose, capillary     Status: Abnormal   Collection Time: 12/09/17  3:46 PM  Result Value Ref Range   Glucose-Capillary 119 (H) 65 - 99 mg/dL   Comment 1 Notify RN    Comment 2 Document in Chart   Glucose, capillary     Status: Abnormal   Collection Time: 12/09/17  7:47 PM    Result Value Ref Range   Glucose-Capillary 219 (H) 65 - 99 mg/dL  Glucose, capillary     Status: None   Collection Time: 12/09/17 11:20 PM  Result Value Ref Range   Glucose-Capillary 76 65 - 99 mg/dL  Basic metabolic panel     Status: Abnormal   Collection Time: 12/10/17  3:28 AM  Result Value Ref Range   Sodium 140 135 - 145 mmol/L   Potassium 3.0 (L) 3.5 - 5.1 mmol/L   Chloride 107 101 - 111 mmol/L   CO2 25 22 - 32 mmol/L   Glucose, Bld 123 (H) 65 - 99 mg/dL   BUN 5 (L) 6 - 20 mg/dL   Creatinine, Ser 1.61 0.44 - 1.00 mg/dL   Calcium 8.1 (L) 8.9 - 10.3 mg/dL   GFR calc non Af Amer >60 >60 mL/min   GFR calc Af Amer >60 >60 mL/min   Anion gap 8 5 - 15  CBC with Differential/Platelet     Status: Abnormal  Collection Time: 12/10/17  3:28 AM  Result Value Ref Range   WBC 7.6 4.0 - 10.5 K/uL   RBC 3.65 (L) 3.87 - 5.11 MIL/uL   Hemoglobin 10.7 (L) 12.0 - 15.0 g/dL   HCT 13.0 (L) 86.5 - 78.4 %   MCV 88.8 78.0 - 100.0 fL   MCH 29.3 26.0 - 34.0 pg   MCHC 33.0 30.0 - 36.0 g/dL   RDW 69.6 29.5 - 28.4 %   Platelets 146 (L) 150 - 400 K/uL   Neutrophils Relative % 51 %   Neutro Abs 3.8 1.7 - 7.7 K/uL   Lymphocytes Relative 41 %   Lymphs Abs 3.1 0.7 - 4.0 K/uL   Monocytes Relative 6 %   Monocytes Absolute 0.5 0.1 - 1.0 K/uL   Eosinophils Relative 2 %   Eosinophils Absolute 0.2 0.0 - 0.7 K/uL   Basophils Relative 0 %   Basophils Absolute 0.0 0.0 - 0.1 K/uL  Glucose, capillary     Status: Abnormal   Collection Time: 12/10/17  3:28 AM  Result Value Ref Range   Glucose-Capillary 122 (H) 65 - 99 mg/dL   Dg Swallowing Func-speech Pathology  Result Date: 12/09/2017 Objective Swallowing Evaluation: Type of Study: MBS-Modified Barium Swallow Study  Patient Details Name: Brittany [name] MRN: [id number] Date of Birth: 01-Jul-1961 Today's Date: 12/09/2017 Time: SLP Start Time (ACUTE ONLY): 1034 -SLP Stop Time (ACUTE ONLY): 1053 SLP Time Calculation (min) (ACUTE ONLY): 19 min Past Medical History:  Past Medical History: Diagnosis Date . Depression  . Diabetes mellitus without complication (HCC)  . Fibromyalgia  . High cholesterol  Past Surgical History: Past Surgical History: Procedure Laterality Date . CHOLECYSTECTOMY   . IR CT HEAD LTD  12/06/2017 . IR PERCUTANEOUS ART THROMBECTOMY/INFUSION INTRACRANIAL INC DIAG ANGIO  12/06/2017 . RADIOLOGY WITH ANESTHESIA N/A 12/06/2017  Procedure: CODE STROKE;  Surgeon: Julieanne Cotton, MD;  Location: [facility] OR;  Service: Radiology;  Laterality: N/A; . SPINE SURGERY   . THROMBECTOMY FEMORAL ARTERY Right 12/07/2017  Procedure: THROMBECTOMY RIGHT FEMORAL ARTERY;  Surgeon: Maeola Harman, MD;  Location: [facility] OR;  Service: Vascular;  Laterality: Right; . 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  Subjective: Pt alert and cooperative to assessment Assessment / Plan / Recommendation CHL IP CLINICAL IMPRESSIONS 12/09/2017 Clinical Impression Pt presents with inconsistent, minimal flash penetration with consecutive sips of thin liquid; however, laryngeal vestibule was clear of all penetrates following swallow completion. Min pharyngeal residue observed with liquids, likely related to ACDF hardware impacting UES opening, with all other consistencies consumed without residue. Oral phase was overtly functional for all trials. Of note, pt needed additional puree bolus to clear the barium pill from her esophagus, potentially indicating esophageal issues. Given pt's current swallow function and ability to protect airway despite challenging, recommend initiating regular solids and thin liquids diet with aspiration and esophageal precautions (sitting upright, remaining upright at least 30 min after PO intake, and slow/small bites). Medications administered whole in puree. No SLP follow up warranted for dysphagia. SLP will  continue to follow for pt's communication goals.  SLP Visit Diagnosis Dysphagia, unspecified (R13.10) Attention and concentration deficit following -- Frontal lobe and executive function deficit following -- Impact on safety and function Mild aspiration risk   CHL IP TREATMENT RECOMMENDATION 12/09/2017 Treatment Recommendations Therapy as outlined in treatment plan below  Prognosis 12/08/2017 Prognosis for Safe Diet Advancement Good Barriers to Reach Goals -- Barriers/Prognosis Comment -- CHL IP DIET RECOMMENDATION 12/09/2017 SLP Diet Recommendations Regular solids;Thin liquid Liquid Administration via Straw;Cup Medication Administration Whole meds with puree Compensations Slow rate;Small sips/bites Postural Changes Remain semi-upright after after feeds/meals (Comment);Seated upright at 90 degrees   CHL IP OTHER RECOMMENDATIONS 12/09/2017 Recommended Consults Consider esophageal assessment Oral Care Recommendations Oral care BID Other Recommendations --   CHL IP FOLLOW UP RECOMMENDATIONS 12/09/2017 Follow up Recommendations Inpatient Rehab   CHL IP FREQUENCY AND DURATION 12/09/2017 Speech Therapy Frequency (ACUTE ONLY) min 2x/week Treatment Duration 2 weeks      CHL IP ORAL PHASE 12/09/2017 Oral Phase WFL Oral - Pudding Teaspoon -- Oral - Pudding Cup -- Oral - Honey Teaspoon -- Oral - Honey Cup -- Oral - Nectar Teaspoon -- Oral - Nectar Cup -- Oral - Nectar Straw -- Oral - Thin Teaspoon -- Oral - Thin Cup -- Oral - Thin Straw -- Oral - Puree -- Oral - Mech Soft -- Oral - Regular -- Oral - Multi-Consistency -- Oral - Pill -- Oral Phase - Comment --  CHL IP PHARYNGEAL PHASE 12/09/2017 Pharyngeal Phase WFL Pharyngeal- Pudding Teaspoon -- Pharyngeal -- Pharyngeal- Pudding Cup -- Pharyngeal -- Pharyngeal- Honey Teaspoon -- Pharyngeal -- Pharyngeal- Honey Cup -- Pharyngeal -- Pharyngeal- Nectar Teaspoon -- Pharyngeal -- Pharyngeal- Nectar Cup -- Pharyngeal -- Pharyngeal- Nectar Straw -- Pharyngeal -- Pharyngeal- Thin Teaspoon --  Pharyngeal -- Pharyngeal- Thin Cup -- Pharyngeal -- Pharyngeal- Thin Straw -- Pharyngeal -- Pharyngeal- Puree -- Pharyngeal -- Pharyngeal- Mechanical Soft -- Pharyngeal -- Pharyngeal- Regular -- Pharyngeal -- Pharyngeal- Multi-consistency -- Pharyngeal -- Pharyngeal- Pill -- Pharyngeal -- Pharyngeal Comment --  CHL IP CERVICAL ESOPHAGEAL PHASE 12/09/2017 Cervical Esophageal Phase Impaired Pudding Teaspoon -- Pudding Cup -- Honey Teaspoon -- Honey Cup -- Nectar Teaspoon -- Nectar Cup -- Nectar Straw -- Thin Teaspoon -- Thin Cup Reduced cricopharyngeal relaxation Thin Straw -- Puree -- Mechanical Soft -- Regular -- Multi-consistency -- Pill -- Cervical Esophageal Comment -- No flowsheet data found. Maxcine Ham 12/09/2017, 2:55 PM  Note populated for [place] Jarrett, Student SLP Maxcine Ham, M.A. CCC-SLP [phone]              Assessment/Plan: Diagnosis: Left posterior branch MCA infarct causing fluid to a aphasia as well as gait disorder secondary to CVA. 1. Does the need for close, 24 hr/day medical supervision in concert with the patient's rehab needs make it unreasonable for this patient to be served in a less intensive setting? Yes 2. Co-Morbidities requiring supervision/potential complications: Right deep femoral artery thrombus and right femoral vein thrombus status post thromboembolectomies 3. Due to bladder management, bowel management, safety, skin/wound care, disease management, medication administration, pain management and patient education, does the patient require 24 hr/day rehab nursing? Yes 4. Does the patient require coordinated care of a physician, rehab nurse, PT (1-2 hrs/day, 5 days/week), OT (1-2 hrs/day, 5 days/week) and SLP (.5-1 hrs/day, 5 days/week) to address physical and functional deficits in the context of the above medical diagnosis(es)? Yes Addressing deficits in the following areas: balance, endurance, locomotion, strength, transferring, bowel/bladder control,  bathing, dressing, feeding, grooming, toileting, cognition, speech, language and psychosocial support 5. Can the patient actively participate in an intensive therapy program of at least 3 hrs of therapy per day at least 5 days per week? Yes 6. The potential for patient to make measurable gains while on inpatient rehab is good 7. Anticipated functional outcomes upon  discharge from inpatient rehab are modified independent and supervision  with PT, modified independent and supervision with OT, min assist and mod assist with SLP. 8. Estimated rehab length of stay to reach the above functional goals is: 7-10d 9. Anticipated D/C setting: Home 10. Anticipated post D/C treatments: HH therapy 11. Overall Rehab/Functional Prognosis: good  RECOMMENDATIONS: This patient's condition is appropriate for continued rehabilitative care in the following setting: CIR Patient has agreed to participate in recommended program. Yes Note that insurance prior authorization may be required for reimbursement for recommended care.  Comment: Discussed with family need for 24-hour supervision post rehab admission.  Husband states he is able to provide this.  Son is also available as well.  Erick ColaceAndrew E. Edlin Ford M.D. [facility] FAAPM&R (Sports Med, Neuromuscular Med) Diplomate Am Board of Electrodiagnostic Med  Charlton AmorDaniel J Angiulli, Cordelia Poche-C 12/10/2017

## 2017-12-10 NOTE — Care Management Note (Signed)
Case Management Note  Patient Details  Name: Brittany [name] MRN: [id number] Date of Birth: Jun 16, 1961  Subjective/Objective:   57 yr old female with PMHx NIDDM, HTN, HLD, previous CVA now with global aphasia secondary to an insular infarct w/ suspected embolus.  Pt s/p distal left M2 revascularization with OR thrombectomy.  PTA, pt independent, lives with spouse.                    Action/Plan: PT/OT recommending CIR, and consult in process.  Case manager will follow to assist with dc planning.    Expected Discharge Date:                  Expected Discharge Plan:  IP Rehab Facility  In-House Referral:     Discharge planning Services  CM Consult  Post Acute Care Choice:    Choice offered to:     DME Arranged:    DME Agency:     HH Arranged:    HH Agency:     Status of Service:  In process, will continue to follow  If discussed at Long Length of Stay Meetings, dates discussed:    Additional Comments:  Glennon Macmerson, Aprille Sawhney M, RN 12/10/2017, 3:25 PM

## 2017-12-10 NOTE — Progress Notes (Signed)
Physical Therapy Treatment Patient Details Name: Brittany [name] MRN: [id number] DOB: [birth year] Today's Date: 12/10/2017    History of Present Illness 57 yr old female with PMHx NIDDM, HTN, HLD, previous CVA now with global aphasia secondary to an insular infarct w/ suspected embolus distal left M2 s/p revascularization -> OR Thrombectomy with acute hypovolemic shock    PT Comments    Pt much improved from mobility stand point however con't to have significant aphasia both global and receptive. Pt con't to perseverate, speak jargon majority of the time, demo's impaired processing and sequencing and R sided inattention. Acute PT to con't to follow. Con't to recommend CIR Upon d/c.   Follow Up Recommendations  CIR;Supervision/Assistance - 24 hour     Equipment Recommendations  Other (comment)    Recommendations for Other Services       Precautions / Restrictions Precautions Precautions: Fall Precaution Comments: global aphasia Restrictions Weight Bearing Restrictions: No    Mobility  Bed Mobility               General bed mobility comments: pt up in chair upon PT arrival  Transfers Overall transfer level: Needs assistance   Transfers: Sit to/from Stand Sit to Stand: Min assist         General transfer comment: max directional v/c's, minA for tactile cues to initiated transfer  Ambulation/Gait Ambulation/Gait assistance: Min assist Ambulation Distance (Feet): 600 Feet Assistive device: 1 person hand held assist Gait Pattern/deviations: Step-through pattern;Decreased stride length Gait velocity: slow Gait velocity interpretation: Below normal speed for age/gender General Gait Details: pt with no episodes of knee buckling. pt con't to require minA for navigation in hallways and is unable to read signs. max verbal cues to identify objects along the walker   Stairs            Wheelchair Mobility    Modified Rankin (Stroke Patients Only) Modified Rankin  (Stroke Patients Only) Pre-Morbid Rankin Score: No symptoms Modified Rankin: Moderately severe disability     Balance Overall balance assessment: Needs assistance Sitting-balance support: No upper extremity supported;Feet supported Sitting balance-Leahy Scale: Good     Standing balance support: No upper extremity supported Standing balance-Leahy Scale: Fair                              Cognition Arousal/Alertness: Awake/alert Behavior During Therapy: Flat affect Overall Cognitive Status: Difficult to assess                                 General Comments: pt with improved command following but continues to have both expressive and receptive aphasia      Exercises      General Comments        Pertinent Vitals/Pain Pain Assessment: Faces Faces Pain Scale: No hurt Pain Intervention(s): Monitored during session    Home Living                      Prior Function            PT Goals (current goals can now be found in the care plan section) Acute Rehab PT Goals Patient Stated Goal: return home Progress towards PT goals: Progressing toward goals    Frequency    Min 3X/week      PT Plan Current plan remains appropriate    Co-evaluation  AM-PAC PT "6 Clicks" Daily Activity  Outcome Measure  Difficulty turning over in bed (including adjusting bedclothes, sheets and blankets)?: None Difficulty moving from lying on back to sitting on the side of the bed? : A Little Difficulty sitting down on and standing up from a chair with arms (e.g., wheelchair, bedside commode, etc,.)?: A Little Help needed moving to and from a bed to chair (including a wheelchair)?: A Little Help needed walking in hospital room?: A Little Help needed climbing 3-5 steps with a railing? : A Little 6 Click Score: 19    End of Session Equipment Utilized During Treatment: Gait belt Activity Tolerance: Patient tolerated treatment  well Patient left: in chair;with call bell/phone within reach;with chair alarm set;with family/visitor present Nurse Communication: Mobility status;Precautions PT Visit Diagnosis: Other abnormalities of gait and mobility (R26.89);Other symptoms and signs involving the nervous system (R29.898)     Time: [id number] PT Time Calculation (min) (ACUTE ONLY): 20 min  Charges:  $Gait Training: 8-22 mins                    G Codes:       Lewis ShockAshly Kaizen Ibsen, PT, DPT Pager #: [phone] Office #: [phone]    Brittany [name] 12/10/2017, 11:42 AM

## 2017-12-10 NOTE — H&P (View-Only) (Signed)
STROKE TEAM PROGRESS NOTE   HISTORY OF PRESENT ILLNESS (per record) Brittany [name] is an 57 y.o. female with a PMH of HTN, HLD, DM, Hx of prior CVA and tobacco abuse who presents to the ED as a code stroke with reports of acute onset Global Aphasia, last seen normal at approximately 09:15 this morning. CT Head code stroke reveals Acute Left Insula Infarct with suspected embolus in distal Left M2. No contraindications were identified in patients history and IV tPA was administered. IR was notified and patient family was briefed by Dr Laurence Slate. Risks and benefits of IR procedure was explained and family wants to proceed.  Date last known well: Date: 12/06/2017 Time last known well: Time: 09:15 tPA Given: Yes Saturday 12/06/17 at 1400  Modified Rankin: Rankin Score=1 NIH Stroke Scale: 10     SUBJECTIVE (INTERVAL HISTORY) Her family is not at bedside, she  Remains neurological is stable. She still has excellent pulses in the right lower extremity. TEE scheduled for 4 PM today. Patient is sitting up in bed. Aphasia still present but improving    OBJECTIVE Temp:  [98.5 F (36.9 C)-99.4 F (37.4 C)] 99.3 F (37.4 C) (04/03 0800) Pulse Rate:  [70-86] 82 (04/03 1000) Cardiac Rhythm: Normal sinus rhythm (04/03 0800) Resp:  [14-23] 21 (04/03 1000) BP: (101-157)/(47-90) 157/75 (04/03 1000) SpO2:  [90 %-97 %] 97 % (04/03 1000)  CBC:  Recent Labs  Lab 12/09/17 0401 12/10/17 0328  WBC 11.2* 7.6  NEUTROABS 7.5 3.8  HGB 11.2* 10.7*  HCT 34.3* 32.4*  MCV 90.0 88.8  PLT 122* 146*    Basic Metabolic Panel:  Recent Labs  Lab 12/08/17 0546 12/09/17 0401 12/10/17 0328  NA 139 142 140  K 3.7 3.4* 3.0*  CL 111 111 107  CO2 18* 21* 25  GLUCOSE 175* 118* 123*  BUN 9 13 5*  CREATININE 0.48 0.50 0.45  CALCIUM 8.1* 8.1* 8.1*  MG 1.8  --   --   PHOS 3.1  --   --     Lipid Panel:     Component Value Date/Time   CHOL 161 12/07/2017 0443   CHOL 156 05/17/2017 1119   TRIG 75 12/07/2017  0443   HDL 40 (L) 12/07/2017 0443   HDL 55 05/17/2017 1119   CHOLHDL 4.0 12/07/2017 0443   VLDL 15 12/07/2017 0443   LDLCALC 106 (H) 12/07/2017 0443   LDLCALC 81 05/17/2017 1119   HgbA1c:  Lab Results  Component Value Date   HGBA1C 11.5 (H) 12/07/2017   Urine Drug Screen:     Component Value Date/Time   LABOPIA NONE DETECTED 12/06/2017 1840   COCAINSCRNUR NONE DETECTED 12/06/2017 1840   LABBENZ NONE DETECTED 12/06/2017 1840   AMPHETMU NONE DETECTED 12/06/2017 1840   THCU NONE DETECTED 12/06/2017 1840   LABBARB NONE DETECTED 12/06/2017 1840    Alcohol Level     Component Value Date/Time   ETH <10 12/06/2017 1330    IMAGING  Ct Angio Head W Or Wo Contrast Ct Angio Neck W Or Wo Contrast 12/06/2017 IMPRESSION:  1. Mid to distal left M2 branch occlusion.  2. No large vessel occlusion or significant proximal stenosis in the head or neck.    Ct Head Code Stroke Wo Contrast 12/06/2017 IMPRESSION:  1. 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 No hemorrhage.  2. ASPECTS is 9.  3. Suspected embolus in a mid to distal left M2 branch vessel.    CT Head Wo Contrast 12/07/2017 IMPRESSION: Moderate  left frontal parietal acute infarct correlating with M2 occlusion seen yesterday. Contrast and or hemorrhage in the left sylvian fissure has diminished from intraprocedural head CT.     MRI Brain Wo Contrast - 1. Acute left MCA branch infarct correlating with M2 occlusion seen by CTA yesterday.   petechial hemorrhage.  Vascular US ABI - Right: Resting right ankle-brachial index is within normal range. No evidence of significant right lower extremity arterial disease. Left: Resting left ankle-brachial index is within normal range. No evidence of significant left lower extremity arterial disease    Transthoracic Echocardiogram -normal ejection fraction. No cardiac source of embolism     Cerebral Angiogram - Dr Corliss Skains 12/06/2017 S/P  Lt common carotid  arteriogram,followed by near complete revascularization of occluded distal M2 branch of Lt MCA with x1 pass with solitaire Fr43mm x 40 mmretriver  and 4.5 mg of IA integrelin achieving a TICI 2b reperfusion.  Physical exam: Exam: Gen: NAD,pleasant middle-aged Caucasian lady not in distress            CV: RRR, no MRG. No Carotid Bruits. No peripheral edema, warm, nontender, normal dorsalis pedis pulses Lungs clear to auscultation.    Neuro: Detailed Neurologic Exam  Speech:    Speech is awake alert and aphasic Cognition:    The patient is aphasic and   following commandsnly to mimic. Speaks short sentences but at times word salad and difficult to understand. She occasionally mimics some midline commands Cranial Nerves:    The pupils are equal, round, and reactive to light.  Extraocular movements are intact.  The face is symmetric. The palate elevates in the midline. Hearing intact to voice. Voice is normal. The tongue has normal motion without fasciculations.   Coordination:    No dysmetria noted  Motor Observation:    No asymmetry, no atrophy, and no involuntary movements noted.    Strength and sensation: Difficult exam due to aphasia, withdraws in all extremities.        PHYSICAL EXAM Vitals:   12/10/17 0700 12/10/17 0800 12/10/17 0900 12/10/17 1000  BP: 138/71 (!) 145/69 140/64 (!) 157/75  Pulse: 75 82 80 82  Resp: (!) 23 (!) 21 (!) 22 (!) 21  Temp:  99.3 F (37.4 C)    TempSrc:  Oral    SpO2: 95% 96% 94% 97%  Weight:      Height:          ASSESSMENT/PLAN Brittany [name] is a 57 y.o. female with history of HTN, HLD, DM, Hx of prior CVA and tobacco abuse  presenting with global aphasia. tPA Given: Yes Saturday 12/06/17 at 1400 Mechanical thrombectomy M2 branch of Left MCA -> TICI 2b reperfusion.  Stroke:  Moderate left frontal parietal acute infarct - embolic -cryptogenic source  .  Resultant  Aphasia,    CT head - Moderate left frontal parietal acute  infarct - Contrast and or hemorrhage in the left sylvian fissure MRI head -1. Acute left MCA branch infarct correlating with M2 occlusion seen  by CTA yesterday. Generalized petechial hemorrhage.MRA head - not performed  CTA H&N - Mid to distal left M2 branch occlusion.   Carotid Doppler - CTA neck  UDS - negative  2D Echo -unremarkable   LDL - 81  HgbA1c - 11.5  VTE prophylaxis - SCDs Check puncture sites for bleeding or hematomas. Bleeding precautions Fall precautions Diet Carb Modified Fluid consistency: Thin; Room service appropriate? Yes  aspirin 325 mg daily prior to admission, now on No  antithrombotic S/P tPA  Patient counseled to be compliant with her antithrombotic medications  Ongoing aggressive stroke risk factor management  Therapy recommendations: CLR   Disposition: CLR  Hypertension  Stable  Permissive hypertension (OK if < 220/120) but gradually normalize in 5-7 days . Long-term BP goal normotensive  Hyperlipidemia  Lipid lowering medication PTA:  Lipitor 40 mg daily  LDL 81, goal < 70  Current lipid lowering medication   Lipitor 80 mg daily  Continue statin at discharge  Diabetes  HgbA1c 11.5, goal < 7.0  Uncontrolled  Consider diabetic teaching nurse consult if aphasia can be overcome.  Other Stroke Risk Factors  Cigarette smoker - advised to stop smoking  ETOH use, advised to drink no more than 1 drink per day.  Hx stroke/TIA   Other Active Problems  Mild anemia  Leukocytosis -16.9 - afebrile - monitor -   S/p revascularization right leg 12/07/17-    Hypokalemia-will replace   Plan / Recommendations   Stroke workup: awaiting - 2D echo ; head CT ; MRI ; lower ext ABIs  Consider TEE / loop  Therapy Follow Up: pending  Disposition:CLR  Antiplatelet / Anticoagulation: Resume aspirin .  Statin:   Lipitor 80 mg  )  MD Follow Up: pending  Other: pending  Further risk factor modification per primary care MD: Follow  Up 2 weeks  Smoking cessation  Control glucose   Personally examined patient and images, and have participated in and made any corrections needed to history, physical, neuro exam,assessment and plan as stated above.  I have personally obtained the history, evaluated lab date, reviewed imaging studies and agree with radiology interpretations. Plan continuet aspirin for stroke prevention and Lipitor. plan TEE later today and loop recorder prior to discharge.plan transfer to the neurology floor later today No family is available for discussion. Plan to place potassium.Discussed with critical care physician. This patient is critically ill and at significant risk of neurological worsening, death and care requires constant monitoring of vital signs, hemodynamics,respiratory and cardiac monitoring, extensive review of multiple databases, frequent neurological assessment, discussion with family, other specialists and medical decision making of high complexity.I have made any additions or clarifications directly to the above note.This critical care time does not reflect procedure time, or teaching time or supervisory time of PA/NP/Med Resident etc but could involve care discussion time.  I spent 30 minutes of neurocritical care time  in the care of  this patient.       Delia HeadyPramod , MD Stroke Neurology  To contact Stroke Continuity provider, please refer to [email or web address]. After hours, contact General Neurology

## 2017-12-10 NOTE — CV Procedure (Signed)
    TEE:  Stroke  Time out performed  During this procedure the patient is administered a total of Versed 3 mg and Fentanyl 50 mcg to achieve and maintain moderate conscious sedation.  The patient's heart rate, blood pressure, and oxygen saturation are monitored continuously during the procedure. The period of conscious sedation is 25 minutes, of which I was present face-to-face 100% of this time.   Findings:  Normal EF 50-55% Mild MR Negative bubble study No LAA thrombus  No embolic source  Donato SchultzMark Elleanor Guyett, MD

## 2017-12-10 NOTE — Progress Notes (Signed)
  Progress Note    12/10/2017 10:50 AM 3 Days Post-Op  Subjective: No complaints this morning she is awake and eating breakfast  Vitals:   12/10/17 0900 12/10/17 1000  BP: 140/64 (!) 157/75  Pulse: 80 82  Resp: (!) 22 (!) 21  Temp:    SpO2: 94% 97%    Physical Exam: She is awake and alert Nonlabored respirations Right groin incision clean dry and intact She has a palpable dorsalis pedis pulse She is moving her right lower extremity much more this morning  CBC    Component Value Date/Time   WBC 7.6 12/10/2017 0328   RBC 3.65 (L) 12/10/2017 0328   HGB 10.7 (L) 12/10/2017 0328   HGB 13.2 08/28/2016 1520   HCT 32.4 (L) 12/10/2017 0328   HCT 40.1 08/28/2016 1520   PLT 146 (L) 12/10/2017 0328   PLT 245 08/28/2016 1520   MCV 88.8 12/10/2017 0328   MCV 89.2 07/30/2017 1740   MCV 91 08/28/2016 1520   MCH 29.3 12/10/2017 0328   MCHC 33.0 12/10/2017 0328   RDW 12.4 12/10/2017 0328   RDW 13.3 08/28/2016 1520   LYMPHSABS 3.1 12/10/2017 0328   LYMPHSABS 2.6 08/28/2016 1520   MONOABS 0.5 12/10/2017 0328   EOSABS 0.2 12/10/2017 0328   EOSABS 0.3 08/28/2016 1520   BASOSABS 0.0 12/10/2017 0328   BASOSABS 0.0 08/28/2016 1520    BMET    Component Value Date/Time   NA 140 12/10/2017 0328   NA 136 07/30/2017 1803   K 3.0 (L) 12/10/2017 0328   CL 107 12/10/2017 0328   CO2 25 12/10/2017 0328   GLUCOSE 123 (H) 12/10/2017 0328   BUN 5 (L) 12/10/2017 0328   BUN 8 07/30/2017 1803   CREATININE 0.45 12/10/2017 0328   CREATININE 0.59 07/13/2016 1112   CALCIUM 8.1 (L) 12/10/2017 0328   GFRNONAA >60 12/10/2017 0328   GFRNONAA >89 07/15/2014 0947   GFRAA >60 12/10/2017 0328   GFRAA >89 07/15/2014 0947    INR    Component Value Date/Time   INR 0.90 12/06/2017 1300     Intake/Output Summary (Last 24 hours) at 12/10/2017 1050 Last data filed at 12/10/2017 0800 Gross per 24 hour  Intake 860 ml  Output 425 ml  Net 435 ml     Assessment:  57 y.o. female is s/p right lower  extremity thromboembolectomy now with palpable pulse  Plan: Activity as tolerated Aspirin sufficient from vascular standpoint   Oval Moralez C. Randie Heinzain, MD [facility] [place] Office: [phone] Pager: [phone]  12/10/2017 10:50 AM

## 2017-12-10 NOTE — Progress Notes (Signed)
I have begun insurance approval with [employer] of [place] for a possible admit to inpt pending insurance approval and bed availability. [phone]

## 2017-12-10 NOTE — Progress Notes (Signed)
EP called for loop implant consultation, please notify our service when discharge timing is known.  Thank you Francis Dowseenee Ursuy, PA-C

## 2017-12-10 NOTE — Discharge Instructions (Signed)
Transesophageal Echocardiogram °Transesophageal echocardiography (TEE) is a picture test of your heart using sound waves. The pictures taken can give very detailed pictures of your heart. This can help your doctor see if there are problems with your heart. TEE can check: °· If your heart has blood clots in it. °· How well your heart valves are working. °· If you have an infection on the inside of your heart. °· Some of the major arteries of your heart. °· If your heart valve is working after a repair. °· Your heart before a procedure that uses a shock to your heart to get the rhythm back to normal. ° °What happens before the procedure? °· Do not eat or drink for 6 hours before the procedure or as told by your doctor. °· Make plans to have someone drive you home after the procedure. Do not drive yourself home. °· An IV tube will be put in your arm. °What happens during the procedure? °· You will be given a medicine to help you relax (sedative). It will be given through the IV tube. °· A numbing medicine will be sprayed or gargled in the back of your throat to help numb it. °· The tip of the probe is placed into the back of your mouth. You will be asked to swallow. This helps to pass the probe into your esophagus. °· Once the tip of the probe is in the right place, your doctor can take pictures of your heart. °· You may feel pressure at the back of your throat. °What happens after the procedure? °· You will be taken to a recovery area so the sedative can wear off. °· Your throat may be sore and scratchy. This will go away slowly over time. °· You will go home when you are fully awake and able to swallow liquids. °· You should have someone stay with you for the next 24 hours. °· Do not drive or operate machinery for the next 24 hours. °This information is not intended to replace advice given to you by your health care provider. Make sure you discuss any questions you have with your health care provider. °Document  Released: 06/23/2009 Document Revised: 02/01/2016 Document Reviewed: 02/25/2013 °Elsevier Interactive Patient Education © 2018 [employer] ° °

## 2017-12-10 NOTE — Interval H&P Note (Signed)
History and Physical Interval Note:  12/10/2017 12:20 PM  Brittany [name]  has presented today for surgery, with the diagnosis of STROKE  The various methods of treatment have been discussed with the patient and family. After consideration of risks, benefits and other options for treatment, the patient has consented to  Procedure(s): TRANSESOPHAGEAL ECHOCARDIOGRAM (TEE) (N/A) as a surgical intervention .  The patient's history has been reviewed, patient examined, no change in status, stable for surgery.  I have reviewed the patient's chart and labs.  Questions were answered to the patient's satisfaction.     Coca ColaMark Skains

## 2017-12-10 NOTE — Progress Notes (Signed)
STROKE TEAM PROGRESS NOTE   HISTORY OF PRESENT ILLNESS (per record) Brittany [name] is an 56 y.o. female with a PMH of HTN, HLD, DM, Hx of prior CVA and tobacco abuse who presents to the ED as a code stroke with reports of acute onset Global Aphasia, last seen normal at approximately 09:15 this morning. CT Head code stroke reveals Acute Left Insula Infarct with suspected embolus in distal Left M2. No contraindications were identified in patients history and IV tPA was administered. IR was notified and patient family was briefed by Dr Aroor. Risks and benefits of IR procedure was explained and family wants to proceed.  Date last known well: Date: 12/06/2017 Time last known well: Time: 09:15 tPA Given: Yes Saturday 12/06/17 at 1400  Modified Rankin: Rankin Score=1 NIH Stroke Scale: 10     SUBJECTIVE (INTERVAL HISTORY) Her family is not at bedside, she  Remains neurological is stable. She still has excellent pulses in the right lower extremity. TEE scheduled for 4 PM today. Patient is sitting up in bed. Aphasia still present but improving    OBJECTIVE Temp:  [98.5 F (36.9 C)-99.4 F (37.4 C)] 99.3 F (37.4 C) (04/03 0800) Pulse Rate:  [70-86] 82 (04/03 1000) Cardiac Rhythm: Normal sinus rhythm (04/03 0800) Resp:  [14-23] 21 (04/03 1000) BP: (101-157)/(47-90) 157/75 (04/03 1000) SpO2:  [90 %-97 %] 97 % (04/03 1000)  CBC:  Recent Labs  Lab 12/09/17 0401 12/10/17 0328  WBC 11.2* 7.6  NEUTROABS 7.5 3.8  HGB 11.2* 10.7*  HCT 34.3* 32.4*  MCV 90.0 88.8  PLT 122* 146*    Basic Metabolic Panel:  Recent Labs  Lab 12/08/17 0546 12/09/17 0401 12/10/17 0328  NA 139 142 140  K 3.7 3.4* 3.0*  CL 111 111 107  CO2 18* 21* 25  GLUCOSE 175* 118* 123*  BUN 9 13 5*  CREATININE 0.48 0.50 0.45  CALCIUM 8.1* 8.1* 8.1*  MG 1.8  --   --   PHOS 3.1  --   --     Lipid Panel:     Component Value Date/Time   CHOL 161 12/07/2017 0443   CHOL 156 05/17/2017 1119   TRIG 75 12/07/2017  0443   HDL 40 (L) 12/07/2017 0443   HDL 55 05/17/2017 1119   CHOLHDL 4.0 12/07/2017 0443   VLDL 15 12/07/2017 0443   LDLCALC 106 (H) 12/07/2017 0443   LDLCALC 81 05/17/2017 1119   HgbA1c:  Lab Results  Component Value Date   HGBA1C 11.5 (H) 12/07/2017   Urine Drug Screen:     Component Value Date/Time   LABOPIA NONE DETECTED 12/06/2017 1840   COCAINSCRNUR NONE DETECTED 12/06/2017 1840   LABBENZ NONE DETECTED 12/06/2017 1840   AMPHETMU NONE DETECTED 12/06/2017 1840   THCU NONE DETECTED 12/06/2017 1840   LABBARB NONE DETECTED 12/06/2017 1840    Alcohol Level     Component Value Date/Time   ETH <10 12/06/2017 1330    IMAGING  Ct Angio Head W Or Wo Contrast Ct Angio Neck W Or Wo Contrast 12/06/2017 IMPRESSION:  1. Mid to distal left M2 branch occlusion.  2. No large vessel occlusion or significant proximal stenosis in the head or neck.    Ct Head Code Stroke Wo Contrast 12/06/2017 IMPRESSION:  1. 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 No hemorrhage.  2. ASPECTS is 9.  3. Suspected embolus in a mid to distal left M2 branch vessel.    CT Head Wo Contrast 12/07/2017 IMPRESSION: Moderate   left frontal parietal acute infarct correlating with M2 occlusion seen yesterday. Contrast and or hemorrhage in the left sylvian fissure has diminished from intraprocedural head CT.     MRI Brain Wo Contrast - 1. Acute left MCA branch infarct correlating with M2 occlusion seen by CTA yesterday.   petechial hemorrhage.  Vascular US ABI - Right: Resting right ankle-brachial index is within normal range. No evidence of significant right lower extremity arterial disease. Left: Resting left ankle-brachial index is within normal range. No evidence of significant left lower extremity arterial disease    Transthoracic Echocardiogram -normal ejection fraction. No cardiac source of embolism     Cerebral Angiogram - Dr Deveshwar 12/06/2017 S/P  Lt common carotid  arteriogram,followed by near complete revascularization of occluded distal M2 branch of Lt MCA with x1 pass with solitaire Fr4mm x 40 mmretriver  and 4.5 mg of IA integrelin achieving a TICI 2b reperfusion.  Physical exam: Exam: Gen: NAD,pleasant middle-aged Caucasian lady not in distress            CV: RRR, no MRG. No Carotid Bruits. No peripheral edema, warm, nontender, normal dorsalis pedis pulses Lungs clear to auscultation.    Neuro: Detailed Neurologic Exam  Speech:    Speech is awake alert and aphasic Cognition:    The patient is aphasic and   following commandsnly to mimic. Speaks short sentences but at times word salad and difficult to understand. She occasionally mimics some midline commands Cranial Nerves:    The pupils are equal, round, and reactive to light.  Extraocular movements are intact.  The face is symmetric. The palate elevates in the midline. Hearing intact to voice. Voice is normal. The tongue has normal motion without fasciculations.   Coordination:    No dysmetria noted  Motor Observation:    No asymmetry, no atrophy, and no involuntary movements noted.    Strength and sensation: Difficult exam due to aphasia, withdraws in all extremities.        PHYSICAL EXAM Vitals:   12/10/17 0700 12/10/17 0800 12/10/17 0900 12/10/17 1000  BP: 138/71 (!) 145/69 140/64 (!) 157/75  Pulse: 75 82 80 82  Resp: (!) 23 (!) 21 (!) 22 (!) 21  Temp:  99.3 F (37.4 C)    TempSrc:  Oral    SpO2: 95% 96% 94% 97%  Weight:      Height:          ASSESSMENT/PLAN Brittany [name] is a 56 y.o. female with history of HTN, HLD, DM, Hx of prior CVA and tobacco abuse  presenting with global aphasia. tPA Given: Yes Saturday 12/06/17 at 1400 Mechanical thrombectomy M2 branch of Left MCA -> TICI 2b reperfusion.  Stroke:  Moderate left frontal parietal acute infarct - embolic -cryptogenic source  .  Resultant  Aphasia,    CT head - Moderate left frontal parietal acute  infarct - Contrast and or hemorrhage in the left sylvian fissure MRI head -1. Acute left MCA branch infarct correlating with M2 occlusion seen  by CTA yesterday. Generalized petechial hemorrhage.MRA head - not performed  CTA H&N - Mid to distal left M2 branch occlusion.   Carotid Doppler - CTA neck  UDS - negative  2D Echo -unremarkable   LDL - 81  HgbA1c - 11.5  VTE prophylaxis - SCDs Check puncture sites for bleeding or hematomas. Bleeding precautions Fall precautions Diet Carb Modified Fluid consistency: Thin; Room service appropriate? Yes  aspirin 325 mg daily prior to admission, now on No   antithrombotic S/P tPA  Patient counseled to be compliant with her antithrombotic medications  Ongoing aggressive stroke risk factor management  Therapy recommendations: CLR   Disposition: CLR  Hypertension  Stable  Permissive hypertension (OK if < 220/120) but gradually normalize in 5-7 days . Long-term BP goal normotensive  Hyperlipidemia  Lipid lowering medication PTA:  Lipitor 40 mg daily  LDL 81, goal < 70  Current lipid lowering medication   Lipitor 80 mg daily  Continue statin at discharge  Diabetes  HgbA1c 11.5, goal < 7.0  Uncontrolled  Consider diabetic teaching nurse consult if aphasia can be overcome.  Other Stroke Risk Factors  Cigarette smoker - advised to stop smoking  ETOH use, advised to drink no more than 1 drink per day.  Hx stroke/TIA   Other Active Problems  Mild anemia  Leukocytosis -16.9 - afebrile - monitor -   S/p revascularization right leg 12/07/17-    Hypokalemia-will replace   Plan / Recommendations   Stroke workup: awaiting - 2D echo ; head CT ; MRI ; lower ext ABIs  Consider TEE / loop  Therapy Follow Up: pending  Disposition:CLR  Antiplatelet / Anticoagulation: Resume aspirin .  Statin:   Lipitor 80 mg  )  MD Follow Up: pending  Other: pending  Further risk factor modification per primary care MD: Follow  Up 2 weeks  Smoking cessation  Control glucose   Personally examined patient and images, and have participated in and made any corrections needed to history, physical, neuro exam,assessment and plan as stated above.  I have personally obtained the history, evaluated lab date, reviewed imaging studies and agree with radiology interpretations. Plan continuet aspirin for stroke prevention and Lipitor. plan TEE later today and loop recorder prior to discharge.plan transfer to the neurology floor later today No family is available for discussion. Plan to place potassium.Discussed with critical care physician. This patient is critically ill and at significant risk of neurological worsening, death and care requires constant monitoring of vital signs, hemodynamics,respiratory and cardiac monitoring, extensive review of multiple databases, frequent neurological assessment, discussion with family, other specialists and medical decision making of high complexity.I have made any additions or clarifications directly to the above note.This critical care time does not reflect procedure time, or teaching time or supervisory time of PA/NP/Med Resident etc but could involve care discussion time.  I spent 30 minutes of neurocritical care time  in the care of  this patient.       Pramod Sethi, MD Stroke Neurology  To contact Stroke Continuity provider, please refer to [email or web address]. After hours, contact General Neurology 

## 2017-12-10 NOTE — Progress Notes (Signed)
Pt admitted to the unit from ENDO after procedure; pt is a transfer from [facility]N. Pt alert and responsive but remains aphasic. MAE x4; right groin level 1 with old bruising noted. Pt skin intact with no opened wounds or pressure ulcer noted except for surgical incision. Pt oriented to the unit and room; fall/safety precaution and prevention education completed with family as well; pt in bed with call light within reach and family at bedside. Will continue to monitor pt closely. Dionne BucyP. Amo Marli Diego RN

## 2017-12-11 ENCOUNTER — Encounter (HOSPITAL_COMMUNITY): Payer: Self-pay | Admitting: Cardiology

## 2017-12-11 LAB — CBC WITH DIFFERENTIAL/PLATELET
BASOS PCT: 0 %
Basophils Absolute: 0 10*3/uL (ref 0.0–0.1)
Eosinophils Absolute: 0.2 10*3/uL (ref 0.0–0.7)
Eosinophils Relative: 4 %
HEMATOCRIT: 32.4 % — AB (ref 36.0–46.0)
Hemoglobin: 11.1 g/dL — ABNORMAL LOW (ref 12.0–15.0)
LYMPHS ABS: 2.4 10*3/uL (ref 0.7–4.0)
LYMPHS PCT: 41 %
MCH: 30.7 pg (ref 26.0–34.0)
MCHC: 34.3 g/dL (ref 30.0–36.0)
MCV: 89.5 fL (ref 78.0–100.0)
MONO ABS: 0.3 10*3/uL (ref 0.1–1.0)
MONOS PCT: 6 %
NEUTROS ABS: 2.8 10*3/uL (ref 1.7–7.7)
Neutrophils Relative %: 49 %
Platelets: 162 10*3/uL (ref 150–400)
RBC: 3.62 MIL/uL — ABNORMAL LOW (ref 3.87–5.11)
RDW: 12.3 % (ref 11.5–15.5)
WBC: 5.8 10*3/uL (ref 4.0–10.5)

## 2017-12-11 LAB — GLUCOSE, CAPILLARY
GLUCOSE-CAPILLARY: 171 mg/dL — AB (ref 65–99)
GLUCOSE-CAPILLARY: 198 mg/dL — AB (ref 65–99)
Glucose-Capillary: 109 mg/dL — ABNORMAL HIGH (ref 65–99)
Glucose-Capillary: 108 mg/dL — ABNORMAL HIGH (ref 65–99)
Glucose-Capillary: 197 mg/dL — ABNORMAL HIGH (ref 65–99)
Glucose-Capillary: 146 mg/dL — ABNORMAL HIGH (ref 65–99)

## 2017-12-11 LAB — BASIC METABOLIC PANEL
ANION GAP: 7 (ref 5–15)
BUN: <5 mg/dL — ABNORMAL LOW (ref 6–20)
CALCIUM: 8.4 mg/dL — AB (ref 8.9–10.3)
CHLORIDE: 105 mmol/L (ref 101–111)
CO2: 27 mmol/L (ref 22–32)
Creatinine, Ser: 0.46 mg/dL (ref 0.44–1.00)
GFR calc Af Amer: >60 mL/min (ref >60)
GFR calc non Af Amer: >60 mL/min (ref >60)
GLUCOSE: 104 mg/dL — AB (ref 65–99)
Potassium: 3.1 mmol/L — ABNORMAL LOW (ref 3.5–5.1)
Sodium: 139 mmol/L (ref 135–145)

## 2017-12-11 MED ORDER — POTASSIUM CHLORIDE 20 MEQ PO PACK
40.0000 meq | PACK | Freq: Two times a day (BID) | ORAL | Status: AC
  Administered 2017-12-11 (×2): 40 meq via ORAL
  Filled 2017-12-11 (×2): qty 2

## 2017-12-11 NOTE — Progress Notes (Signed)
   Doing well and discussing rehab versus home. We will have follow-up in 4-6 weeks with right lower extremity duplex and ABI. Aspirin sufficient from a vascular standpoint.   Murat Rideout C. Randie Heinzain, MD [facility] Office: [phone] Pager: [phone]

## 2017-12-11 NOTE — Progress Notes (Addendum)
  Speech Language Pathology Treatment: Cognitive-Linquistic  Patient Details Name: Brittany CampusDenise W [name] MRN: [id number] DOB: Feb 13, 1961 Today's Date: 12/11/2017 Time: [id number] SLP Time Calculation (min) (ACUTE ONLY): 28 min  Assessment / Plan / Recommendation Clinical Impression  Treatment focused on aphasia treatment and education with pt and husband. Pt followed one step commands 35% accuracy today versus less than 25% during initial assessment. Receptive language facilitated by written cues when needed, mild-moderately effective. Continues with deficits recognizing verbal errors and attempting to correct. She did begin to appropriately cry when SLP educating details of aphasia and how it affects her comprehension. She matched written word to object field of 2 with 2/2 accurate and match auditory work to object 1/1 accurate. Demonstrated appropriate use of pen independently. Husband observed SLP cueing pt when target word is known using phrase completion and phonemic cues (mildly effective). Semantic and phonemic paraphasias and neologisms continue. She would benefit from continued intense language/communication tx. Recommend outpatient ST.   HPI HPI: 57 yr old female with PMHx NIDDM, HTN, HLD, previous CVA now with global aphasia secondary to an insular infarct w/ suspected embolus distal left M2 s/p revascularization 12/06/2017.      SLP Plan  Continue with current plan of care       Recommendations                   Oral Care Recommendations: Oral care BID Follow up Recommendations: Outpatient SLP SLP Visit Diagnosis: Cognitive communication deficit (H08.657(R41.841) Plan: Continue with current plan of care       GO                Royce MacadamiaLitaker, Ugochukwu Chichester Willis 12/11/2017, 1:57 PM   Breck CoonsLisa Willis Lonell FaceLitaker M.Ed [employer]SLP Pager [phone]

## 2017-12-11 NOTE — Progress Notes (Signed)
STROKE TEAM PROGRESS NOTE   HISTORY OF PRESENT ILLNESS (per record) Brittany [name] is an 57 y.o. female with a PMH of HTN, HLD, DM, Hx of prior CVA and tobacco abuse who presents to the ED as a code stroke with reports of acute onset Global Aphasia, last seen normal at approximately 09:15 this morning. CT Head code stroke reveals Acute Left Insula Infarct with suspected embolus in distal Left M2. No contraindications were identified in patients history and IV tPA was administered. IR was notified and patient family was briefed by Dr Laurence Slate. Risks and benefits of IR procedure was explained and family wants to proceed.  Date last known well: Date: 12/06/2017 Time last known well: Time: 09:15 tPA Given: Yes Saturday 12/06/17 at 1400  Modified Rankin: Rankin Score=1 NIH Stroke Scale: 10     SUBJECTIVE (INTERVAL HISTORY) Her husband is   at bedside, she  Remains neurological is stable.  The yesterday was negative. Loop recorder is pending. Rehabilitation team feels patient may not be a good patient for inpatient rehabilitationy. Patient is sitting up in bed. Aphasia still present but improving.potassium is yet low.   OBJECTIVE Temp:  [98.2 F (36.8 C)-98.9 F (37.2 C)] 98.7 F (37.1 C) (04/04 1137) Pulse Rate:  [66-78] 78 (04/04 1137) Resp:  [18-20] 20 (04/04 1137) BP: (116-138)/(60-71) 138/67 (04/04 1137) SpO2:  [94 %-98 %] 94 % (04/04 1137) Weight:  [146 lb 13.2 oz (66.6 kg)] 146 lb 13.2 oz (66.6 kg) (04/04 0422)  CBC:  Recent Labs  Lab 12/10/17 0328 12/11/17 0446  WBC 7.6 5.8  NEUTROABS 3.8 2.8  HGB 10.7* 11.1*  HCT 32.4* 32.4*  MCV 88.8 89.5  PLT 146* 162    Basic Metabolic Panel:  Recent Labs  Lab 12/08/17 0546  12/10/17 0328 12/11/17 0446  NA 139   < > 140 139  K 3.7   < > 3.0* 3.1*  CL 111   < > 107 105  CO2 18*   < > 25 27  GLUCOSE 175*   < > 123* 104*  BUN 9   < > 5* <5*  CREATININE 0.48   < > 0.45 0.46  CALCIUM 8.1*   < > 8.1* 8.4*  MG 1.8  --   --   --    PHOS 3.1  --   --   --    < > = values in this interval not displayed.    Lipid Panel:     Component Value Date/Time   CHOL 161 12/07/2017 0443   CHOL 156 05/17/2017 1119   TRIG 75 12/07/2017 0443   HDL 40 (L) 12/07/2017 0443   HDL 55 05/17/2017 1119   CHOLHDL 4.0 12/07/2017 0443   VLDL 15 12/07/2017 0443   LDLCALC 106 (H) 12/07/2017 0443   LDLCALC 81 05/17/2017 1119   HgbA1c:  Lab Results  Component Value Date   HGBA1C 11.5 (H) 12/07/2017   Urine Drug Screen:     Component Value Date/Time   LABOPIA NONE DETECTED 12/06/2017 1840   COCAINSCRNUR NONE DETECTED 12/06/2017 1840   LABBENZ NONE DETECTED 12/06/2017 1840   AMPHETMU NONE DETECTED 12/06/2017 1840   THCU NONE DETECTED 12/06/2017 1840   LABBARB NONE DETECTED 12/06/2017 1840    Alcohol Level     Component Value Date/Time   ETH <10 12/06/2017 1330    IMAGING  Ct Angio Head W Or Wo Contrast Ct Angio Neck W Or Wo Contrast 12/06/2017 IMPRESSION:  1. Mid to distal left  M2 branch occlusion.  2. No large vessel occlusion or significant proximal stenosis in the head or neck.    Ct Head Code Stroke Wo Contrast 12/06/2017 IMPRESSION:  1. 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 No hemorrhage.  2. ASPECTS is 9.  3. Suspected embolus in a mid to distal left M2 branch vessel.    CT Head Wo Contrast 12/07/2017 IMPRESSION: Moderate left frontal parietal acute infarct correlating with M2 occlusion seen yesterday. Contrast and or hemorrhage in the left sylvian fissure has diminished from intraprocedural head CT.     MRI Brain Wo Contrast - 1. Acute left MCA branch infarct correlating with M2 occlusion seen by CTA yesterday.   petechial hemorrhage.  Vascular US ABI - Right: Resting right ankle-brachial index is within normal range. No evidence of significant right lower extremity arterial disease. Left: Resting left ankle-brachial index is within normal range. No evidence of significant left lower  extremity arterial disease    Transthoracic Echocardiogram -normal ejection fraction. No cardiac source of embolism   TEE ; normal  Cerebral Angiogram - Dr Corliss Skains 12/06/2017 S/P  Lt common carotid arteriogram,followed by near complete revascularization of occluded distal M2 branch of Lt MCA with x1 pass with solitaire Fr43mm x 40 mmretriver  and 4.5 mg of IA integrelin achieving a TICI 2b reperfusion.  Physical exam: Exam: Gen: NAD,pleasant middle-aged Caucasian lady not in distress            CV: RRR, no MRG. No Carotid Bruits. No peripheral edema, warm, nontender, normal dorsalis pedis pulses Lungs clear to auscultation.    Neuro: Detailed Neurologic Exam  Speech:    Speech is awake alert and aphasic Cognition:    The patient is aphasic and   following commandsnly to mimic. Speaks short sentences but at times word salad and difficult to understand. She occasionally mimics some midline commands Cranial Nerves:    The pupils are equal, round, and reactive to light.  Extraocular movements are intact.  The face is symmetric. The palate elevates in the midline. Hearing intact to voice. Voice is normal. The tongue has normal motion without fasciculations.   Coordination:    No dysmetria noted  Motor Observation:    No asymmetry, no atrophy, and no involuntary movements noted.    Strength and sensation: Difficult exam due to aphasia, withdraws in all extremities.        PHYSICAL EXAM Vitals:   12/11/17 0008 12/11/17 0422 12/11/17 0812 12/11/17 1137  BP: 116/68 121/71 136/71 138/67  Pulse: 71 66 77 78  Resp: 18 20 20 20   Temp: 98.9 F (37.2 C) 98.4 F (36.9 C) 98.4 F (36.9 C) 98.7 F (37.1 C)  TempSrc: Oral Oral Oral Oral  SpO2: 98% 98% 96% 94%  Weight:  146 lb 13.2 oz (66.6 kg)    Height:          ASSESSMENT/PLAN [name] is a 57 y.o. female with history of HTN, HLD, DM, Hx of prior CVA and tobacco abuse  presenting with global aphasia. tPA Given:  Yes Saturday 12/06/17 at 1400 Mechanical thrombectomy M2 branch of Left MCA -> TICI 2b reperfusion.  Stroke:  Moderate left frontal parietal acute infarct - embolic -cryptogenic source  .  Resultant  Aphasia,    CT head - Moderate left frontal parietal acute infarct - Contrast and or hemorrhage in the left sylvian fissure MRI head -1. Acute left MCA branch infarct correlating with M2 occlusion seen  by CTA yesterday. Generalized  petechial hemorrhage.MRA head - not performed  CTA H&N - Mid to distal left M2 branch occlusion.   Carotid Doppler - CTA neck  UDS - negative  2D Echo -unremarkable   LDL - 81  HgbA1c - 11.5  VTE prophylaxis - SCDs Fall precautions Diet Carb Modified Fluid consistency: Thin; Room service appropriate? Yes  aspirin 325 mg daily prior to admission, now on No antithrombotic S/P tPA  Patient counseled to be compliant with her antithrombotic medications  Ongoing aggressive stroke risk factor management  Therapy recommendations: CLR   Disposition: CLR  Hypertension  Stable  Permissive hypertension (OK if < 220/120) but gradually normalize in 5-7 days . Long-term BP goal normotensive  Hyperlipidemia  Lipid lowering medication PTA:  Lipitor 40 mg daily  LDL 81, goal < 70  Current lipid lowering medication   Lipitor 80 mg daily  Continue statin at discharge  Diabetes  HgbA1c 11.5, goal < 7.0  Uncontrolled  Consider diabetic teaching nurse consult if aphasia can be overcome.  Other Stroke Risk Factors  Cigarette smoker - advised to stop smoking  ETOH use, advised to drink no more than 1 drink per day.  Hx stroke/TIA   Other Active Problems  Mild anemia  Leukocytosis -16.9 - afebrile - monitor -   S/p revascularization right leg 12/07/17-    Hypokalemia-will replace   Plan / Recommendations   Stroke workup: awaiting - 2D echo ; head CT ; MRI ; lower ext ABIs  Therapy Follow Up: pending  Disposition:CLR  Antiplatelet /  Anticoagulation: Resumed aspirin .  Statin:   Lipitor 80 mg  )  MD Follow Up: pending  Other: pending  Further risk factor modification per primary care MD: Follow Up 2 weeks  Smoking cessation  Control glucose    patient was transferred to the floor yesterday. She remains stable. Her potassium remains low despite replacement. Rehabilitation team feels she may not qualify for inpatient rehabilitation. Plan replace potassium and consider discharge home tomorrow morning. Check repeat BMP in the morning. Discussed with patient and husband and answered questions. Discussed with rehabilitation coordinator. Greater than 50% time during this 25 minute visit was spent on counseling and coordination of care about her circulation stroke and answered questions.       Delia HeadyPramod Sethi, MD Stroke Neurology  To contact Stroke Continuity provider, please refer to [email or web address]. After hours, contact General Neurology

## 2017-12-11 NOTE — Progress Notes (Signed)
I met with patient and her spouse at bedside. I discussed that I did not have an inpt rehab bed to offer her for admission and that due to her functional progress, recommend home with [facility] or outpt therapy if spouse could provide 24/7 care vs SNF. Spouse and pt requesting d/c home. Spouse expressing his frustrations at home in that they are primary caregivers for his father in law who has dementia and also required 24/7 assist with his medical issues. They have discussed with a lawyer their options in caring for him and are in the process of finding father in law other accommodations. I recommended they continue to provide 24/7 care of him until other accommodations can be arranged. I have updated RN CM of need to arrange [place] at this time. We will sign off [phone]

## 2017-12-12 ENCOUNTER — Other Ambulatory Visit: Payer: Self-pay

## 2017-12-12 ENCOUNTER — Encounter (HOSPITAL_COMMUNITY)
Admission: EM | Disposition: A | Discharge status: Home Health | Payer: Self-pay | Source: Home / Self Care | Attending: Neurology

## 2017-12-12 ENCOUNTER — Encounter (HOSPITAL_COMMUNITY): Payer: Self-pay | Admitting: Cardiology

## 2017-12-12 DIAGNOSIS — Z8673 Personal history of transient ischemic attack (TIA), and cerebral infarction without residual deficits: Secondary | ICD-10-CM

## 2017-12-12 DIAGNOSIS — I639 Cerebral infarction, unspecified: Secondary | ICD-10-CM | POA: Diagnosis present

## 2017-12-12 DIAGNOSIS — E876 Hypokalemia: Secondary | ICD-10-CM

## 2017-12-12 DIAGNOSIS — I6389 Other cerebral infarction: Secondary | ICD-10-CM

## 2017-12-12 DIAGNOSIS — I63412 Cerebral infarction due to embolism of left middle cerebral artery: Secondary | ICD-10-CM | POA: Diagnosis present

## 2017-12-12 DIAGNOSIS — R4701 Aphasia: Secondary | ICD-10-CM

## 2017-12-12 DIAGNOSIS — G8191 Hemiplegia, unspecified affecting right dominant side: Secondary | ICD-10-CM

## 2017-12-12 DIAGNOSIS — D72829 Elevated white blood cell count, unspecified: Secondary | ICD-10-CM

## 2017-12-12 HISTORY — PX: LOOP RECORDER INSERTION: EP1214

## 2017-12-12 LAB — BASIC METABOLIC PANEL
ANION GAP: 6 (ref 5–15)
BUN: <5 mg/dL — ABNORMAL LOW (ref 6–20)
CO2: 26 mmol/L (ref 22–32)
Calcium: 8.8 mg/dL — ABNORMAL LOW (ref 8.9–10.3)
Chloride: 107 mmol/L (ref 101–111)
Creatinine, Ser: 0.43 mg/dL — ABNORMAL LOW (ref 0.44–1.00)
GFR calc Af Amer: >60 mL/min (ref >60)
Glucose, Bld: 107 mg/dL — ABNORMAL HIGH (ref 65–99)
POTASSIUM: 3.4 mmol/L — AB (ref 3.5–5.1)
SODIUM: 139 mmol/L (ref 135–145)

## 2017-12-12 LAB — CBC WITH DIFFERENTIAL/PLATELET
BASOS ABS: 0 10*3/uL (ref 0.0–0.1)
BASOS PCT: 0 %
EOS ABS: 0.3 10*3/uL (ref 0.0–0.7)
EOS PCT: 5 %
HCT: 34.8 % — ABNORMAL LOW (ref 36.0–46.0)
Hemoglobin: 11.5 g/dL — ABNORMAL LOW (ref 12.0–15.0)
Lymphocytes Relative: 33 %
Lymphs Abs: 2.3 10*3/uL (ref 0.7–4.0)
MCH: 29.6 pg (ref 26.0–34.0)
MCHC: 33 g/dL (ref 30.0–36.0)
MCV: 89.5 fL (ref 78.0–100.0)
Monocytes Absolute: 0.5 10*3/uL (ref 0.1–1.0)
Monocytes Relative: 7 %
Neutro Abs: 3.9 10*3/uL (ref 1.7–7.7)
Neutrophils Relative %: 55 %
PLATELETS: 207 10*3/uL (ref 150–400)
RBC: 3.89 MIL/uL (ref 3.87–5.11)
RDW: 12.2 % (ref 11.5–15.5)
WBC: 7 10*3/uL (ref 4.0–10.5)

## 2017-12-12 LAB — GLUCOSE, CAPILLARY
GLUCOSE-CAPILLARY: 126 mg/dL — AB (ref 65–99)
Glucose-Capillary: 178 mg/dL — ABNORMAL HIGH (ref 65–99)
Glucose-Capillary: 125 mg/dL — ABNORMAL HIGH (ref 65–99)

## 2017-12-12 SURGERY — LOOP RECORDER INSERTION

## 2017-12-12 MED ORDER — LIDOCAINE-EPINEPHRINE 1 %-1:100000 IJ SOLN
INTRAMUSCULAR | Status: AC
Start: 2017-12-12 — End: ?
  Filled 2017-12-12: qty 1

## 2017-12-12 MED ORDER — CLOPIDOGREL BISULFATE 75 MG PO TABS: 75.0000 mg | ORAL_TABLET | Freq: Every day | ORAL | 2 refills | Status: DC

## 2017-12-12 MED ORDER — LIDOCAINE-EPINEPHRINE 1 %-1:100000 IJ SOLN
INTRAMUSCULAR | Status: DC | PRN
  Administered 2017-12-12: 20 mL

## 2017-12-12 MED ORDER — ATORVASTATIN CALCIUM 80 MG PO TABS: 80.0000 mg | ORAL_TABLET | Freq: Every day | ORAL | 2 refills | Status: DC

## 2017-12-12 MED ORDER — INSULIN DETEMIR 100 UNIT/ML ~~LOC~~ SOLN: 5.0000 [IU] | Freq: Two times a day (BID) | SUBCUTANEOUS | 11 refills | Status: DC

## 2017-12-12 MED ORDER — CLOPIDOGREL BISULFATE 75 MG PO TABS
75.0000 mg | ORAL_TABLET | Freq: Every day | ORAL | Status: DC
  Administered 2017-12-12: 75 mg via ORAL
  Filled 2017-12-12: qty 1

## 2017-12-12 SURGICAL SUPPLY — 2 items
LOOP REVEAL LINQSYS (Prosthesis & Implant Heart) ×1 IMPLANT
PACK LOOP INSERTION (CUSTOM PROCEDURE TRAY) ×2 IMPLANT

## 2017-12-12 NOTE — Progress Notes (Signed)
   Right lower extremity is well perfused with palpable dorsalis pedis pulse Right groin incision clean dry and intact Follow-up in 4-6 weeks with duplex and ABI If issues arise over the weekend please call vascular on-call  Amyrie Illingworth C. Randie Heinzain, MD [facility] [place] Office: [phone] Pager: [phone]

## 2017-12-12 NOTE — Progress Notes (Addendum)
Physical Therapy Treatment Patient Details Name: Brittany [name] MRN: [id number] DOB: [birth year] Today's Date: 12/12/2017    History of Present Illness 57 yr old female with PMHx NIDDM, HTN, HLD, previous CVA now with global aphasia secondary to an insular infarct w/ suspected embolus distal left M2 s/p revascularization -> OR Thrombectomy with acute hypovolemic shock    PT Comments    Pt required less assist with ambulation today. Gait is mildly unsteady, however no overt LOB noted. Pt able to ascend and descend stairs with min guard. D/C recommendations updated from CIR to HHPT and 24 hr supervision as pt is progressing well towards goals. Pt expected to d/c home today and would benefit from HHPT after d/c to improve safety with mobility and functional independence.    Follow Up Recommendations  Supervision/Assistance - 24 hour;Home health PT     Equipment Recommendations  None recommended by PT    Recommendations for Other Services       Precautions / Restrictions Precautions Precautions: Fall Precaution Comments: global aphasia Restrictions Weight Bearing Restrictions: No    Mobility  Bed Mobility Overal bed mobility: Needs Assistance Bed Mobility: Supine to Sit     Supine to sit: Supervision        Transfers Overall transfer level: Needs assistance Equipment used: None Transfers: Sit to/from Stand Sit to Stand: Supervision         General transfer comment: supervision for safety  Ambulation/Gait Ambulation/Gait assistance: Min guard Ambulation Distance (Feet): 350 Feet Assistive device: None Gait Pattern/deviations: Step-through pattern;Decreased stride length Gait velocity: slow Gait velocity interpretation: Below normal speed for age/gender General Gait Details: mildly unstady gait, though no assist required and no overt LOB.   Stairs Stairs: Yes   Stair Management: One rail Right;Alternating pattern;Step to pattern;Forwards Number of Stairs:  4 General stair comments: pt able to ascend with reciprocal pattern and descend with step to. Single UE support on hand rail. No assist needed.  Wheelchair Mobility    Modified Rankin (Stroke Patients Only) Modified Rankin (Stroke Patients Only) Pre-Morbid Rankin Score: No symptoms Modified Rankin: Moderately severe disability     Balance Overall balance assessment: Needs assistance Sitting-balance support: No upper extremity supported;Feet supported Sitting balance-Leahy Scale: Good Sitting balance - Comments: able to toilet and perform peri care independently   Standing balance support: No upper extremity supported Standing balance-Leahy Scale: Fair Standing balance comment: no assist required but likely could not tolerate challange                            Cognition Arousal/Alertness: Awake/alert Behavior During Therapy: Flat affect Overall Cognitive Status: Difficult to assess                                 General Comments: Able to give short simple answers, understands most commands, and will ask for clarification if she does not.      Exercises      General Comments        Pertinent Vitals/Pain Pain Assessment: No/denies pain    Home Living                      Prior Function            PT Goals (current goals can now be found in the care plan section) Acute Rehab PT Goals Patient Stated Goal: return home PT  Goal Formulation: With patient Time For Goal Achievement: 12/22/17 Potential to Achieve Goals: Good Progress towards PT goals: Progressing toward goals    Frequency    Min 3X/week      PT Plan Equipment recommendations need to be updated;Discharge plan needs to be updated    Co-evaluation              AM-PAC PT "6 Clicks" Daily Activity  Outcome Measure  Difficulty turning over in bed (including adjusting bedclothes, sheets and blankets)?: None Difficulty moving from lying on back to sitting  on the side of the bed? : None Difficulty sitting down on and standing up from a chair with arms (e.g., wheelchair, bedside commode, etc,.)?: Unable Help needed moving to and from a bed to chair (including a wheelchair)?: A Little Help needed walking in hospital room?: A Little Help needed climbing 3-5 steps with a railing? : A Little 6 Click Score: 18    End of Session Equipment Utilized During Treatment: Gait belt Activity Tolerance: Patient tolerated treatment well Patient left: with call bell/phone within reach;with family/visitor present;in bed Nurse Communication: Mobility status PT Visit Diagnosis: Other abnormalities of gait and mobility (R26.89);Other symptoms and signs involving the nervous system (R29.898)     Time: 2952-8413 PT Time Calculation (min) (ACUTE ONLY): 16 min  Charges:  $Gait Training: 8-22 mins                    G Codes:      Kallie Locks, [place] Pager [phone] Acute Rehab  Sheral Apley 12/12/2017, 11:11 AM

## 2017-12-12 NOTE — Care Management Note (Signed)
Case Management Note  Patient Details  Name: Brittany [name] MRN: [id number] Date of Birth: 1961-04-28  Subjective/Objective:                    Action/Plan: Pt discharging home with orders for [facility] services. CM met with the patients spouse and provided choice. He selected [facility]. Butch Penny with [facility] notified and accepted the referral.  Family able to provide 24/7 supervision at home and transportation home.   Expected Discharge Date:                  Expected Discharge Plan:  [place]  In-House Referral:     Discharge planning Services  CM Consult  Post Acute Care Choice:  Home Health Choice offered to:  Spouse  DME Arranged:    DME Agency:     HH Arranged:  RN, PT, OT, Speech Therapy [place] Agency:  [place]  Status of Service:  Completed, signed off  If discussed at [place] of Stay Meetings, dates discussed:    Additional Comments:  Pollie Friar, RN 12/12/2017, 11:57 AM

## 2017-12-12 NOTE — Discharge Summary (Addendum)
Stroke Discharge Summary  Patient ID: Brittany [name]    l   MRN: [id number]      DOB: 04/18/1961  Date of Admission: 12/06/2017 Date of Discharge: 12/12/2017  Attending Physician:  Brittany [name], Brittany Fries [name], Brittany [name], Stroke Brittany [name] Consultant([name]):   Maeola Harmanain, Brandon Christopher, Brittany [name] (VVS), Loman BrooklynWill Camnitz, Brittany [name] (electrophysiology), Claudette LawsAndrew Kirsteins, Brittany [name] (Physical Medicine & Rehabtilitation)  Patient'[name] PCP:  Sherren MochaShaw, [name], Brittany [name]  DISCHARGE DIAGNOSIS:  Principal Problem:   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 Depression   . Diabetes mellitus without complication (HCC)   . Fibromyalgia   . High cholesterol    Past Surgical History:  Procedure Laterality Date  . CHOLECYSTECTOMY    . IR CT HEAD LTD  12/06/2017  . IR PERCUTANEOUS ART THROMBECTOMY/INFUSION INTRACRANIAL INC DIAG ANGIO  12/06/2017  . RADIOLOGY WITH ANESTHESIA N/A 12/06/2017   Procedure: CODE STROKE;  Surgeon: Julieanne Cottoneveshwar, Sanjeev, Brittany [name];  Location: [facility] OR;  Service: Radiology;  Laterality: N/A;  . SPINE SURGERY    . TEE WITHOUT CARDIOVERSION N/A 12/10/2017   Procedure: TRANSESOPHAGEAL ECHOCARDIOGRAM (TEE);  Surgeon: Jake BatheSkains, Mark C, Brittany [name];  [facility];  Service: Cardiovascular;  Laterality: N/A;  . THROMBECTOMY FEMORAL ARTERY Right 12/07/2017   Procedure: THROMBECTOMY RIGHT FEMORAL ARTERY;  Surgeon: Maeola Harmanain, Brandon Christopher, Brittany [name];  [facility];  Service: Vascular;  Laterality: Right;  . TONSILLECTOMY      Allergies as of 12/12/2017   No Known  Allergies     Medication List    STOP taking these medications   aspirin 325 MG tablet   Canagliflozin-metFORMIN HCl ER 50-1000 MG Tb24 Commonly known as:  INVOKAMET XR   liraglutide 18 MG/3ML Sopn Commonly known as:  VICTOZA   losartan 50 MG tablet Commonly known as:  COZAAR   metFORMIN 500 MG tablet Commonly known as:  GLUCOPHAGE   primidone 250 MG tablet Commonly known as:  MYSOLINE   sitaGLIPtin 100 MG tablet Commonly known as:  JANUVIA     TAKE these medications   albuterol (2.5 MG/3ML) 0.083% nebulizer solution Commonly known as:  PROVENTIL Take 3 mLs (2.5 mg total) by nebulization every 6 (six) hours as needed for wheezing or shortness of breath.   Albuterol Sulfate 108 (90 Base) MCG/ACT Aepb Commonly known as:  PROAIR RESPICLICK Inhale 2 puffs into the lungs every 4 (four) hours as needed.   atorvastatin 80 MG tablet Commonly known as:  LIPITOR Take 1 tablet (80 mg total) by mouth daily at 6 PM. What changed:    medication strength  how much to take  when to take this   BD PEN NEEDLE NANO U/F 32G X 4 MM Misc Generic drug:  Insulin Pen Needle USE WITH VICTOZA PEN   clopidogrel 75 MG tablet Commonly known as:  PLAVIX Take 1 tablet (75 mg total) by mouth daily. Start taking on:  12/13/2017   estradiol 0.1 MG/GM vaginal cream Commonly known as:  ESTRACE Apply to perineum at night x 2  weeks, then every other night. What changed:    when to take this  additional instructions   insulin detemir 100 UNIT/ML injection Commonly known as:  LEVEMIR Inject 0.05 mLs (5 Units total) into the skin every 12 (twelve) hours.   MULTI-B COMPLEX Caps Take one a day ,B complex  multivitamin over the counter or use prenatal vitamin.   sertraline 100 MG tablet Commonly known as:  ZOLOFT Take 2 tablets (200 mg total) by mouth at bedtime.       LABORATORY STUDIES CBC    Component Value Date/Time   WBC 7.0 12/12/2017 0553   RBC 3.89 12/12/2017 0553   HGB  11.5 (L) 12/12/2017 0553   HGB 13.2 08/28/2016 1520   HCT 34.8 (L) 12/12/2017 0553   HCT 40.1 08/28/2016 1520   PLT 207 12/12/2017 0553   PLT 245 08/28/2016 1520   MCV 89.5 12/12/2017 0553   MCV 89.2 07/30/2017 1740   MCV 91 08/28/2016 1520   MCH 29.6 12/12/2017 0553   MCHC 33.0 12/12/2017 0553   RDW 12.2 12/12/2017 0553   RDW 13.3 08/28/2016 1520   LYMPHSABS 2.3 12/12/2017 0553   LYMPHSABS 2.6 08/28/2016 1520   MONOABS 0.5 12/12/2017 0553   EOSABS 0.3 12/12/2017 0553   EOSABS 0.3 08/28/2016 1520   BASOSABS 0.0 12/12/2017 0553   BASOSABS 0.0 08/28/2016 1520   CMP    Component Value Date/Time   NA 139 12/12/2017 0553   NA 136 07/30/2017 1803   K 3.4 (L) 12/12/2017 0553   CL 107 12/12/2017 0553   CO2 26 12/12/2017 0553   GLUCOSE 107 (H) 12/12/2017 0553   BUN <5 (L) 12/12/2017 0553   BUN 8 07/30/2017 1803   CREATININE 0.43 (L) 12/12/2017 0553   CREATININE 0.59 07/13/2016 1112   CALCIUM 8.8 (L) 12/12/2017 0553   PROT 6.8 12/06/2017 1300   PROT 7.0 07/30/2017 1803   ALBUMIN 3.7 12/06/2017 1300   ALBUMIN 4.3 07/30/2017 1803   AST 19 12/06/2017 1300   ALT 17 12/06/2017 1300   ALKPHOS 154 (H) 12/06/2017 1300   BILITOT 0.5 12/06/2017 1300   BILITOT 0.4 07/30/2017 1803   GFRNONAA >60 12/12/2017 0553   GFRNONAA >89 07/15/2014 0947   GFRAA >60 12/12/2017 0553   GFRAA >89 07/15/2014 0947   COAGS Lab Results  Component Value Date   INR 0.90 12/06/2017   Lipid Panel    Component Value Date/Time   CHOL 161 12/07/2017 0443   CHOL 156 05/17/2017 1119   TRIG 75 12/07/2017 0443   HDL 40 (L) 12/07/2017 0443   HDL 55 05/17/2017 1119   CHOLHDL 4.0 12/07/2017 0443   VLDL 15 12/07/2017 0443   LDLCALC 106 (H) 12/07/2017 0443   LDLCALC 81 05/17/2017 1119   HgbA1C  Lab Results  Component Value Date   HGBA1C 11.5 (H) 12/07/2017   Urinalysis    Component Value Date/Time   COLORURINE YELLOW 12/07/2017 0849   APPEARANCEUR CLEAR 12/07/2017 0849   LABSPEC 1.025 12/07/2017  0849   PHURINE 5.0 12/07/2017 0849   GLUCOSEU >=500 (A) 12/07/2017 0849   HGBUR MODERATE (A) 12/07/2017 0849   BILIRUBINUR NEGATIVE 12/07/2017 0849   BILIRUBINUR negative 07/30/2017 1733   KETONESUR 5 (A) 12/07/2017 0849   PROTEINUR NEGATIVE 12/07/2017 0849   UROBILINOGEN 0.2 07/30/2017 1733   NITRITE NEGATIVE 12/07/2017 0849   LEUKOCYTESUR TRACE (A) 12/07/2017 0849   Urine Drug Screen     Component Value Date/Time   LABOPIA NONE DETECTED 12/06/2017 1840  COCAINSCRNUR NONE DETECTED 12/06/2017 1840   LABBENZ NONE DETECTED 12/06/2017 1840   AMPHETMU NONE DETECTED 12/06/2017 1840   THCU NONE DETECTED 12/06/2017 1840   LABBARB NONE DETECTED 12/06/2017 1840    Alcohol Level    Component Value Date/Time   ETH <10 12/06/2017 1330    SIGNIFICANT DIAGNOSTIC STUDIES Ct Head Code Stroke Wo Contrast 12/06/2017 1. 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 No hemorrhage.  2. ASPECTS is 9.  3. Suspected embolus in a mid to distal left M2 branch vessel.   Ct Angio Head W Or Wo Contrast Ct Angio Neck W Or Wo Contrast 12/06/2017 IMPRESSION:  1. Mid to distal left M2 branch occlusion.  2. No large vessel occlusion or significant proximal stenosis in the head or neck.   Cerebral Angiogram - Dr Corliss Skains 12/06/2017 [name]/P Lt common carotid arteriogram,followed by near complete revascularization of occluded distal M2 branch of Lt MCA with x1 pass with solitaire Fr12mm x 40 mmretriver and 4.5 mg of IA integrelin achieving a TICI 2b reperfusion.  CT Head Wo Contrast 12/07/2017 Moderate left frontal parietal acute infarct correlating with M2 occlusion seen yesterday. Contrast and or hemorrhage in the left sylvian fissure has diminished from intraprocedural head CT.  Mr Brain Wo Contrast 12/07/2017 1. Acute left MCA branch infarct correlating with M2 occlusion seen by CTA yesterday. Generalized petechial hemorrhage. 2. The remainder the brain has a normal appearance.   Ct Abdomen  Pelvis Wo Contrast 12/08/2017 1. No acute intra-abdominal or pelvic pathology. No fluid collection or hematoma as clinically concern. 2. Findings of anemia. 3. Fatty liver. 4. No bowel obstruction or active inflammation.  Normal appendix. 5. Small nonobstructing left renal calculus.  No hydronephrosis. 6. Post residual changes of the right groin as described. No drainable fluid collection or hematoma.    LE ABIs 12/08/2017 Right 1.19 Left 1.3   TEE 12/10/2017 Normal EF 50-55% Mild MR Negative bubble study No LAA thrombus     HISTORY OF PRESENT ILLNESS MODELL FENDRICK is an 57 y.o. female with a PMH of HTN, HLD, DM, Hx of prior CVA and tobacco abuse who presents to the ED as a code stroke with reports of acute onset Global Aphasia, last seen normal at approximately 09:15 this morning 12/06/2017. CT Head code stroke reveals Acute Left Insula Infarct with suspected embolus in distal Left M2. No contraindications were identified in patients history and IV tPA was administered. IR was notified and patient family was briefed by Dr Laurence Slate. Risks and benefits of IR procedure was explained and family wants to proceed. NIH Stroke Scale: 10. Modified Rankin: Rankin Score=1. Patient was administered IV TPA and was admitted to the neuro ICU for further evaluation and treatment.    HOSPITAL COURSE Ms. RAMAYA GUILE is a 57 y.o. female with history of HTN, HLD, DM, Hx of prior CVA and tobacco abuse  presenting with global aphasia. She was administered IV TPA 12/06/17 at 1400 followed by mechanical thrombectomy M2 branch of Left MCA with TICI 2b reperfusion.  Stroke:  Moderate left frontal parietal acute infarct - embolic - cryptogenic source  .  Resultant  Aphasia,  R hemiparesis  CT head - Moderate left frontal parietal acute infarct - Contrast and or hemorrhage in the left sylvian fissure  CTA H&N - Mid to distal left M2 branch occlusion.   MRI head -1. Acute left MCA branch infarct correlating with  M2 occlusion seenby CTA yesterday. Generalized petechial hemorrhage.  2D Echo -unremarkable  TEE no SOE  Implantable loop recorder placed for 01/26/2018 to look for A. Fib as source of embolic stroke  UDS - negative  LDL - 81  HgbA1c - 11.5  aspirin 325 mg daily prior to admission, treated with aspirin in hospital. Given stroke on aspirin, changed to Plavix 75 mg in hospital. Continue Plavix at discharge  Patient counseled to be compliant with her antithrombotic medications  Ongoing aggressive stroke risk factor management  Therapy recommendations: initially recommended CIR but then had good improvement. Now recommend home health PT, OT, SLP  Disposition: home with husband with home health PT, OT, SLP, RN  RLL ischemia  Following neuro intervention, secondary to sheath  [name]/p RLE thromboembolectomy 12/07/2017  Follow-up Dr. Randie Heinz in 4-6 weeks with duplex an ABI  Hypertension  Stable  On cozaar in the past. Has not filled since Sept. BP good here. Will not resume at this time.  BP goal normotensive  Hyperlipidemia  Lipid lowering medication PTA:  Lipitor 40 mg daily  LDL 81, goal < 70  Current lipid lowering medication   Lipitor 80 mg daily    Continue statin at discharge  Diabetes  HgbA1c 11.5, goal < 7.0  Uncontrolled  CBGs 125-197  Other Stroke Risk Factors  Cigarette smoker - advised to stop smoking  ETOH use, advised to drink no more than 1 drink per day.  Hx stroke/TIA  Other Active Problems  Mild anemia 10.1->11.5  Leukocytosis, resolved 16.9 -> 7.0  Hypokalemia, replaced  Depression, resume zoloft at d/c   DISCHARGE EXAM Blood pressure 116/61, pulse 77, temperature 98.4 F (36.9 C), temperature source Oral, resp. rate 18, height 5\' 3"  (1.6 m), weight 60.3 kg (132 lb 15 oz), SpO2 95 %. Gen: NAD,pleasant middle-aged Caucasian lady not in distress            CV: RRR, no MRG. No Carotid Bruits. No peripheral edema, warm,  nontender, normal dorsalis pedis pulses Lungs clear to auscultation. Neuro: Speech:    Speech is awake alert and aphasic Cognition:    The patient is aphasic and   following commandsnly to mimic. Speaks short sentences but at times word salad and difficult to understand. She occasionally mimics some midline commands Cranial Nerves:    The pupils are equal, round, and reactive to light.  Extraocular movements are intact.  The face is symmetric. The palate elevates in the midline. Hearing intact to voice. Voice is normal. The tongue has normal motion without fasciculations.  Coordination:    No dysmetria noted  Motor Observation:    No asymmetry, no atrophy, and no involuntary movements noted. Strength and sensation: Difficult exam due to aphasia, withdraws in all extremities.   Discharge Diet   Diet Carb Modified thin liquids  DISCHARGE PLAN  Disposition:  Home with home PT, OT, SLP and RN  clopidogrel 75 mg daily for secondary stroke prevention.  Loop recorder monitoring for atrial fibrillation as source of stroke  Ongoing risk factor control by Primary Care Physician at time of discharge  Follow-up [name], Brittany [name] in 2 weeks.  Follow-up in [facility] in 4 weeks, office to schedule an appointment.   45 minutes were spent preparing discharge.  Annie Main, MSN, APRN, ANVP-BC, AGPCNP-BC Advanced Practice Stroke Nurse [facility] See Amion for Schedule & Pager information 12/12/2017 3:04 PM  I have personally examined this patient, reviewed notes, independently viewed imaging studies, participated in medical decision making and plan of care.ROS completed by  me personally and pertinent positives fully documented  I have made any additions or clarifications directly to the above note. Agree with note above.  Delia Heady, Brittany [name] Medical Director [facility] Pager: [phone] 12/12/2017 3:36 PM

## 2017-12-12 NOTE — Plan of Care (Signed)
  Problem: Health Behavior/Discharge Planning: Goal: Ability to safely manage health-related needs after discharge will improve Outcome: Adequate for Discharge   Problem: Education: Goal: Knowledge of General Education information will improve Outcome: Adequate for Discharge   Problem: Health Behavior/Discharge Planning: Goal: Ability to manage health-related needs will improve Outcome: Adequate for Discharge   Problem: Clinical Measurements: Goal: Ability to maintain clinical measurements within normal limits will improve Outcome: Adequate for Discharge Goal: Will remain free from infection Outcome: Adequate for Discharge Goal: Diagnostic test results will improve Outcome: Adequate for Discharge Goal: Cardiovascular complication will be avoided Outcome: Adequate for Discharge   Problem: Activity: Goal: Risk for activity intolerance will decrease Outcome: Adequate for Discharge   Problem: Nutrition: Goal: Adequate nutrition will be maintained Outcome: Adequate for Discharge   Problem: Coping: Goal: Level of anxiety will decrease Outcome: Adequate for Discharge   Problem: Elimination: Goal: Will not experience complications related to bowel motility Outcome: Adequate for Discharge   Problem: Pain Managment: Goal: General experience of comfort will improve Outcome: Adequate for Discharge   Problem: Safety: Goal: Ability to remain free from injury will improve Outcome: Adequate for Discharge   Problem: Skin Integrity: Goal: Risk for impaired skin integrity will decrease Outcome: Adequate for Discharge   Problem: Education: Goal: Knowledge of secondary prevention will improve Outcome: Adequate for Discharge

## 2017-12-12 NOTE — Progress Notes (Signed)
Occupational Therapy Treatment Patient Details Name: Brittany [name] MRN: [id number] DOB: 1961/01/30 Today's Date: 12/12/2017    History of present illness 57 yr old female with PMHx NIDDM, HTN, HLD, previous CVA now with global aphasia secondary to an insular infarct w/ suspected embolus distal left M2 s/p revascularization -> OR Thrombectomy with acute hypovolemic shock   OT comments  Pt is making excellent progress. She is able to perform ADLs at min guard assist.  She does demonstrate deficits with problem solving and organization. Vision is still difficult to accurately assess due to communication deficits, but she spontaneously scanned environment to locae needed items on Lt and Rt. Spouse able to provide 24 hour assist at discharge.  Recommend HHOT.   Follow Up Recommendations  Home health OT;Supervision/Assistance - 24 hour    Equipment Recommendations  None recommended by OT(has seat that can be used in shower )    Recommendations for Other Services      Precautions / Restrictions Precautions Precautions: Fall Precaution Comments: receptive and expressive communication deficits  Restrictions Weight Bearing Restrictions: No       Mobility Bed Mobility Overal bed mobility: Independent Bed Mobility: Supine to Sit     Supine to sit: Supervision        Transfers Overall transfer level: Needs assistance Equipment used: None Transfers: Sit to/from [employer] Transfers Sit to Stand: Supervision Stand pivot transfers: Supervision       General transfer comment: supervision for safety    Balance Overall balance assessment: Needs assistance Sitting-balance support: Feet unsupported Sitting balance-Leahy Scale: Good Sitting balance - Comments: able to don/doff socks    Standing balance support: No upper extremity supported Standing balance-Leahy Scale: Fair Standing balance comment: no assist required but likely could not tolerate challange                            ADL either performed or assessed with clinical judgement   ADL Overall ADL's : Needs assistance/impaired Eating/Feeding: Independent   Grooming: Wash/dry hands;Wash/dry face;Oral care;Brushing hair;Supervision/safety;Standing   Upper Body Bathing: Supervision/ safety;Sitting   Lower Body Bathing: Min guard;Sit to/from stand   Upper Body Dressing : Set up;Sitting   Lower Body Dressing: Minimal assistance;Sit to/from stand   Toilet Transfer: Min guard;Ambulation;Comfort height toilet   Toileting- Clothing Manipulation and Hygiene: Min guard;Sit to/from stand   Tub/ Shower Transfer: Walk-in shower;Min guard;Ambulation Tub/Shower Transfer Details (indicate cue type and reason): pt's spouse was instructed in options for shower seats - they do have a seat they can use at home  Functional mobility during ADLs: Min guard       Vision   Additional Comments: Pt still unable to participate in formal assessment due to communication deficits.  She was able to locate items on both Lt and Rt, without difficulty during ADL task.     Perception     Praxis      Cognition Arousal/Alertness: Awake/alert Behavior During Therapy: Flat affect Overall Cognitive Status: Difficult to assess Area of Impairment: Attention;Problem solving                   Current Attention Level: Selective         Problem Solving: Requires verbal cues General Comments: Pt demonstrates mild organizational deficits during grooming tasks.  Applied polident to dentures while dentures in cup, making it very difficult to apply it appropriately.  She was able to recognize error, and start over while holding  dentures         Exercises     Shoulder Instructions       General Comments      Pertinent Vitals/ Pain       Pain Assessment: No/denies pain  Home Living                                          Prior Functioning/Environment               Frequency  Min 3X/week        Progress Toward Goals  OT Goals(current goals can now be found in the care plan section)  Progress towards OT goals: Progressing toward goals  Acute Rehab OT Goals Patient Stated Goal: return home  Plan Discharge plan needs to be updated    Co-evaluation                 AM-PAC PT "6 Clicks" Daily Activity     Outcome Measure   Help from another person eating meals?: None Help from another person taking care of personal grooming?: A Little Help from another person toileting, which includes using toliet, bedpan, or urinal?: A Little Help from another person bathing (including washing, rinsing, drying)?: A Little Help from another person to put on and taking off regular upper body clothing?: A Little Help from another person to put on and taking off regular lower body clothing?: A Little 6 Click Score: 19    End of Session    OT Visit Diagnosis: Unsteadiness on feet (R26.81);Other abnormalities of gait and mobility (R26.89);Muscle weakness (generalized) (M62.81);Low vision, both eyes (H54.2);Other symptoms and signs involving cognitive function   Activity Tolerance Patient tolerated treatment well   Patient Left in bed;with call bell/phone within reach;with nursing/sitter in room;with family/visitor present   Nurse Communication Mobility status        Time: [id number] OT Time Calculation (min): 18 min  Charges: OT General Charges $OT Visit: 1 Visit OT Treatments $Self Care/Home Management : 8-22 mins  Reynolds AmericanWendi Marializ Ferrebee, OTR/L [phone]    Jeani HawkingConarpe, Evah Rashid M 12/12/2017, 1:13 PM

## 2017-12-12 NOTE — Consult Note (Signed)
Cardiology Consultation:   Brittany [name] ID: Brittany [name]; [id number]; 1961/05/21   Admit date: 12/06/2017 Date of Consult: 12/12/2017  Primary Care Provider: [name], MD Primary Cardiologist: No primary care provider on file.  Primary Electrophysiologist:  Brittany [name]   Brittany [name] Profile:   Brittany [name] is a 57 y.o. female with a hx of hypertension, hyperlipidemia, diabetes who is being seen today for the evaluation of cryptogenic stroke at the request of Brittany [name].  History of Present Illness:   Brittany [name] is a 57 year old female with a past history of hypertension, hyperlipidemia, and prior CVA as well as tobacco abuse who presented to the emergency room after acute onset of global aphasia.  CT of the head showed acute left insular infarct with suspected embolus of the M2 on the left.  She did receive IV TPA.  She had intervention by IR.  Today she feels well.  Her aphasia is improving.  She is anxious to start rehab.  Past Medical History:  Diagnosis Date  . Depression   . Diabetes mellitus without complication (HCC)   . Fibromyalgia   . High cholesterol     Past Surgical History:  Procedure Laterality Date  . CHOLECYSTECTOMY    . IR CT HEAD LTD  12/06/2017  . IR PERCUTANEOUS ART THROMBECTOMY/INFUSION INTRACRANIAL INC DIAG ANGIO  12/06/2017  . RADIOLOGY WITH ANESTHESIA N/A 12/06/2017   Procedure: CODE STROKE;  Surgeon: Julieanne Cotton, MD;  Location: [facility] OR;  Service: Radiology;  Laterality: N/A;  . SPINE SURGERY    . TEE WITHOUT CARDIOVERSION N/A 12/10/2017   Procedure: TRANSESOPHAGEAL ECHOCARDIOGRAM (TEE);  Surgeon: Jake Bathe, MD;  Location: [facility] ENDOSCOPY;  Service: Cardiovascular;  Laterality: N/A;  . THROMBECTOMY FEMORAL ARTERY Right 12/07/2017   Procedure: THROMBECTOMY RIGHT FEMORAL ARTERY;  Surgeon: Maeola Harman, MD;  Location: [facility] OR;  Service: Vascular;  Laterality: Right;  . TONSILLECTOMY       Home Medications:  Prior to Admission medications     Medication Sig Start Date End Date Taking? Authorizing Provider  albuterol (PROVENTIL) (2.5 MG/3ML) 0.083% nebulizer solution Take 3 mLs (2.5 mg total) by nebulization every 6 (six) hours as needed for wheezing or shortness of breath. Brittany [name] not taking: Reported on 12/07/2017 07/13/16   Brittany Mocha, MD  Albuterol Sulfate (PROAIR RESPICLICK) 108 (90 Base) MCG/ACT AEPB Inhale 2 puffs into the lungs every 4 (four) hours as needed. Brittany [name] not taking: Reported on 12/07/2017 07/13/16   Brittany Mocha, MD  aspirin 325 MG tablet Take 325 mg by mouth daily.    [provider]  atorvastatin (LIPITOR) 40 MG tablet Take 1 tablet (40 mg total) by mouth daily. 05/17/17   Brittany Mocha, MD  B Complex-Biotin-FA (MULTI-B COMPLEX) CAPS Take one a day ,B complex  multivitamin over the counter or use prenatal vitamin. Brittany [name] not taking: Reported on 12/07/2017 08/28/16   Dohmeier, Porfirio Mylar, MD  BD PEN NEEDLE NANO U/F 32G X 4 MM MISC USE WITH VICTOZA PEN 05/23/17   Brittany Mocha, MD  Canagliflozin-Metformin HCl ER (INVOKAMET XR) 50-1000 MG TB24 Take 2 tablets by mouth daily. 05/17/17   Brittany Mocha, MD  estradiol (ESTRACE) 0.1 MG/GM vaginal cream Apply to perineum at night x 2 weeks, then every other night. Brittany [name] taking differently: See admin instructions. Filled 07/30/17: Apply to perineum at night x 2 weeks, then every other night. 07/30/17   Brittany Mocha, MD  liraglutide (VICTOZA) 18 MG/3ML SOPN Inject 0.3 mLs (1.8 mg  total) into the skin daily. 05/17/17   Brittany Mocha, MD  losartan (COZAAR) 50 MG tablet Take 1 tablet (50 mg total) by mouth daily. 05/17/17   Brittany Mocha, MD  metFORMIN (GLUCOPHAGE) 500 MG tablet Take 500 mg by mouth 2 (two) times daily with a meal.     [provider]  primidone (MYSOLINE) 250 MG tablet Take 1 tablet (250 mg total) by mouth at bedtime. 08/28/16   Dohmeier, Porfirio Mylar, MD  sertraline (ZOLOFT) 100 MG tablet Take 2 tablets (200 mg total) by mouth at bedtime. 05/17/17   Brittany Mocha, MD   sitaGLIPtin (JANUVIA) 100 MG tablet Take 1 tablet (100 mg total) by mouth daily. 05/17/17   Brittany Mocha, MD    Inpatient Medications: Scheduled Meds: .  stroke: mapping our early stages of recovery book   Does not apply Once  . aspirin  325 mg Oral Daily  . atorvastatin  80 mg Oral q1800  . docusate sodium  100 mg Oral Daily  . enoxaparin (LOVENOX) injection  40 mg Subcutaneous Q24H  . insulin aspart  2-6 Units Subcutaneous Q4H  . insulin detemir  5 Units Subcutaneous Q12H  . mouth rinse  15 mL Mouth Rinse BID   Continuous Infusions: . potassium chloride     PRN Meds: potassium chloride, RESOURCE THICKENUP CLEAR  Allergies:   No Known Allergies  Social History:   Social History   Socioeconomic History  . Marital status: Married    Spouse name: Brittany [name]  . Number of children: 2  . Years of education: 49  . Highest education level: Not on file  Occupational History  . Not on file  Social Needs  . Financial resource strain: Not on file  . Food insecurity:    Worry: Not on file    Inability: Not on file  . Transportation needs:    Medical: Not on file    Non-medical: Not on file  Tobacco Use  . Smoking status: Current Every Day Smoker    Packs/day: 1.00    Years: 2.00    Pack years: 2.00    Types: Cigarettes  . Smokeless tobacco: Never Used  Substance and Sexual Activity  . Alcohol use: Yes    Comment: rarely  . Drug use: No  . Sexual activity: Never  Lifestyle  . Physical activity:    Days per week: Not on file    Minutes per session: Not on file  . Stress: Not on file  Relationships  . Social connections:    Talks on phone: Not on file    Gets together: Not on file    Attends religious service: Not on file    Active member of club or organization: Not on file    Attends meetings of clubs or organizations: Not on file    Relationship status: Not on file  . Intimate partner violence:    Fear of current or ex partner: Not on file    Emotionally abused: Not  on file    Physically abused: Not on file    Forced sexual activity: Not on file  Other Topics Concern  . Not on file  Social History Narrative   Brittany [name] is married ([name]) and lives at home with her family   Brittany [name] has two children.   Brittany [name] works at [employer].   Brittany [name] has a college education.   Brittany [name] is right-handed.   Brittany [name] drinks 3 cups of coffee M-F.  Family History:    Family History  Problem Relation Age of Onset  . Cancer Mother   . Diabetes Mother   . Heart disease Mother   . Parkinsonism Mother   . Diabetes Father   . Heart disease Father   . Dementia Father   . Cancer Maternal Grandmother 62       lung  . COPD Maternal Aunt      ROS:  Please see the history of present illness.   All other ROS reviewed and negative.     Physical Exam/Data:   Vitals:   12/11/17 1558 12/11/17 2028 12/12/17 0013 12/12/17 0429  BP: 135/63 118/63 121/68 (!) 103/45  Pulse: 78 77 79 66  Resp: 20 20 18 20   Temp: 99 F (37.2 C) 98.4 F (36.9 C) 98.3 F (36.8 C) 98.2 F (36.8 C)  TempSrc: Oral Oral Oral Oral  SpO2: 96% 96% 96% 95%  Weight:    132 lb 15 oz (60.3 kg)  Height:        Intake/Output Summary (Last 24 hours) at 12/12/2017 0729 Last data filed at 12/11/2017 1901 Gross per 24 hour  Intake 720 ml  Output -  Net 720 ml   Filed Weights   12/06/17 1300 12/11/17 0422 12/12/17 0429  Weight: 134 lb 4.2 oz (60.9 kg) 146 lb 13.2 oz (66.6 kg) 132 lb 15 oz (60.3 kg)   Body mass index is 23.55 kg/m.  General:  Well nourished, well developed, in no acute distress HEENT: normal Lymph: no adenopathy Neck: no JVD Endocrine:  No thryomegaly Vascular: No carotid bruits; FA pulses 2+ bilaterally without bruits  Cardiac:  normal S1, S2; RRR; no murmur  Lungs:  clear to auscultation bilaterally, no wheezing, rhonchi or rales  Abd: soft, nontender, no hepatomegaly  Ext: no edema Musculoskeletal:  No deformities, BUE and BLE strength normal and  equal Skin: warm and dry  Neuro:  CNs 2-12 intact, no focal abnormalities noted Psych:  Normal affect   EKG:  The EKG was personally reviewed and demonstrates: Sinus rhyhm  Relevant CV Studies: TEE - Left ventricle: Systolic function was normal. The estimated   ejection fraction was in the range of 50% to 55%. Wall motion was   normal; there were no regional wall motion abnormalities. - Mitral valve: There was mild regurgitation. - Left atrium: No evidence of thrombus in the atrial cavity or   appendage. - Right atrium: No evidence of thrombus in the atrial cavity or   appendage. - Atrial septum: No defect or patent foramen ovale was identified.   Echo contrast study showed no right-to-left atrial level shunt,   at baseline or with provocation.  Impressions:  - No cardiac source of emboli was indentified.  Laboratory Data:  Chemistry Recent Labs  Lab 12/09/17 0401 12/10/17 0328 12/11/17 0446  NA 142 140 139  K 3.4* 3.0* 3.1*  CL 111 107 105  CO2 21* 25 27  GLUCOSE 118* 123* 104*  BUN 13 5* <5*  CREATININE 0.50 0.45 0.46  CALCIUM 8.1* 8.1* 8.4*  GFRNONAA >60 >60 >60  GFRAA >60 >60 >60  ANIONGAP 10 8 7     Recent Labs  Lab 12/06/17 1300  PROT 6.8  ALBUMIN 3.7  AST 19  ALT 17  ALKPHOS 154*  BILITOT 0.5   Hematology Recent Labs  Lab 12/09/17 0401 12/10/17 0328 12/11/17 0446  WBC 11.2* 7.6 5.8  RBC 3.81* 3.65* 3.62*  HGB 11.2* 10.7* 11.1*  HCT 34.3* 32.4* 32.4*  MCV 90.0 88.8 89.5  MCH 29.4 29.3 30.7  MCHC 32.7 33.0 34.3  RDW 12.9 12.4 12.3  PLT 122* 146* 162   Cardiac EnzymesNo results for input(s): TROPONINI in the last 168 hours.  Recent Labs  Lab 12/06/17 1331  TROPIPOC 0.00    BNPNo results for input(s): BNP, PROBNP in the last 168 hours.  DDimer No results for input(s): DDIMER in the last 168 hours.  Radiology/Studies:  Dg Swallowing Func-speech Pathology  Result Date: 12/09/2017 Objective Swallowing Evaluation: Type of Study:  MBS-Modified Barium Swallow Study  Brittany [name] Details Name: LADEANA LAPLANT MRN: [id number] Date of Birth: [birth year] Today's Date: 12/09/2017 Time: SLP Start Time (ACUTE ONLY): 1034 -SLP Stop Time (ACUTE ONLY): 1053 SLP Time Calculation (min) (ACUTE ONLY): 19 min Past Medical History: Past Medical History: Diagnosis Date . Depression  . Diabetes mellitus without complication (HCC)  . Fibromyalgia  . High cholesterol  Past Surgical History: Past Surgical History: Procedure Laterality Date . CHOLECYSTECTOMY   . IR CT HEAD LTD  12/06/2017 . IR PERCUTANEOUS ART THROMBECTOMY/INFUSION INTRACRANIAL INC DIAG ANGIO  12/06/2017 . RADIOLOGY WITH ANESTHESIA N/A 12/06/2017  Procedure: CODE STROKE;  Surgeon: Julieanne Cotton, MD;  Location: [facility] OR;  Service: Radiology;  Laterality: N/A; . SPINE SURGERY   . THROMBECTOMY FEMORAL ARTERY Right 12/07/2017  Procedure: THROMBECTOMY RIGHT FEMORAL ARTERY;  Surgeon: Maeola Harman, MD;  [facility] OR;  Service: Vascular;  Laterality: Right; . TONSILLECTOMY   HPI: 56 yr old female with PMHx NIDDM, HTN, HLD, previous CVA now with global aphasia secondary to an insular infarct w/ suspected embolus distal left M2 s/p revascularization 12/06/2017.  Subjective: Pt alert and cooperative to assessment Assessment / Plan / Recommendation CHL IP CLINICAL IMPRESSIONS 12/09/2017 Clinical Impression Pt presents with inconsistent, minimal flash penetration with consecutive sips of thin liquid; however, laryngeal vestibule was clear of all penetrates following swallow completion. Min pharyngeal residue observed with liquids, likely related to ACDF hardware impacting UES opening, with all other consistencies consumed without residue. Oral phase was overtly functional for all trials. Of note, pt needed additional puree bolus to clear the barium pill from her esophagus, potentially indicating esophageal issues. Given pt's current swallow function and ability to protect airway despite challenging, recommend  initiating regular solids and thin liquids diet with aspiration and esophageal precautions (sitting upright, remaining upright at least 30 min after PO intake, and slow/small bites). Medications administered whole in puree. No SLP follow up warranted for dysphagia. SLP Calil Amor continue to follow for pt's communication goals.  SLP Visit Diagnosis Dysphagia, unspecified (R13.10) Attention and concentration deficit following -- Frontal lobe and executive function deficit following -- Impact on safety and function Mild aspiration risk   CHL IP TREATMENT RECOMMENDATION 12/09/2017 Treatment Recommendations Therapy as outlined in treatment plan below   Prognosis 12/08/2017 Prognosis for Safe Diet Advancement Good Barriers to Reach Goals -- Barriers/Prognosis Comment -- CHL IP DIET RECOMMENDATION 12/09/2017 SLP Diet Recommendations Regular solids;Thin liquid Liquid Administration via Straw;Cup Medication Administration Whole meds with puree Compensations Slow rate;Small sips/bites Postural Changes Remain semi-upright after after feeds/meals (Comment);Seated upright at 90 degrees   CHL IP OTHER RECOMMENDATIONS 12/09/2017 Recommended Consults Consider esophageal assessment Oral Care Recommendations Oral care BID Other Recommendations --   CHL IP FOLLOW UP RECOMMENDATIONS 12/09/2017 Follow up Recommendations Inpatient Rehab   CHL IP FREQUENCY AND DURATION 12/09/2017 Speech Therapy Frequency (ACUTE ONLY) min 2x/week Treatment Duration 2 weeks      CHL IP ORAL PHASE 12/09/2017 Oral Phase WFL Oral -  Pudding Teaspoon -- Oral - Pudding Cup -- Oral - Honey Teaspoon -- Oral - Honey Cup -- Oral - Nectar Teaspoon -- Oral - Nectar Cup -- Oral - Nectar Straw -- Oral - Thin Teaspoon -- Oral - Thin Cup -- Oral - Thin Straw -- Oral - Puree -- Oral - Mech Soft -- Oral - Regular -- Oral - Multi-Consistency -- Oral - Pill -- Oral Phase - Comment --  CHL IP PHARYNGEAL PHASE 12/09/2017 Pharyngeal Phase WFL Pharyngeal- Pudding Teaspoon -- Pharyngeal -- Pharyngeal-  Pudding Cup -- Pharyngeal -- Pharyngeal- Honey Teaspoon -- Pharyngeal -- Pharyngeal- Honey Cup -- Pharyngeal -- Pharyngeal- Nectar Teaspoon -- Pharyngeal -- Pharyngeal- Nectar Cup -- Pharyngeal -- Pharyngeal- Nectar Straw -- Pharyngeal -- Pharyngeal- Thin Teaspoon -- Pharyngeal -- Pharyngeal- Thin Cup -- Pharyngeal -- Pharyngeal- Thin Straw -- Pharyngeal -- Pharyngeal- Puree -- Pharyngeal -- Pharyngeal- Mechanical Soft -- Pharyngeal -- Pharyngeal- Regular -- Pharyngeal -- Pharyngeal- Multi-consistency -- Pharyngeal -- Pharyngeal- Pill -- Pharyngeal -- Pharyngeal Comment --  CHL IP CERVICAL ESOPHAGEAL PHASE 12/09/2017 Cervical Esophageal Phase Impaired Pudding Teaspoon -- Pudding Cup -- Honey Teaspoon -- Honey Cup -- Nectar Teaspoon -- Nectar Cup -- Nectar Straw -- Thin Teaspoon -- Thin Cup Reduced cricopharyngeal relaxation Thin Straw -- Puree -- Mechanical Soft -- Regular -- Multi-consistency -- Pill -- Cervical Esophageal Comment -- No flowsheet data found. Maxcine Hamaiewonsky, Laura 12/09/2017, 2:55 PM  Note populated for [place] Jarrett, Student SLP Maxcine HamLaura Paiewonsky, M.A. CCC-SLP [phone]              Assessment and Plan:   1. Cryptogenic stroke: Brittany [name] initially presented with a aphasia that has been since improving.  She did have moderate frontal parietal acute infarct that appears to be embolic.  MRA showed acute MCA left occlusion correlating to M2 occlusion.  TEE, TTE, carotid Dopplers unremarkable.  I did discuss with the Brittany [name] the possibility of link implantation.  Risks and benefits were discussed and include bleeding and infection.  The Brittany [name] understands these risks and is agreed to the procedure.   For questions or updates, please contact [facility] Please consult [email or web address] for contact info under Cardiology/STEMI.   Signed, Preslie Depasquale Jorja LoaMartin Quiara Killian, MD  12/12/2017 7:29 AM

## 2017-12-13 ENCOUNTER — Emergency Department (HOSPITAL_COMMUNITY): Payer: BLUE CROSS/BLUE SHIELD

## 2017-12-13 ENCOUNTER — Emergency Department (HOSPITAL_COMMUNITY)
Admission: EM | Admit: 2017-12-13 | Discharge: 2017-12-13 | Disposition: A | Discharge status: Home / Self Care | Payer: BLUE CROSS/BLUE SHIELD | Attending: Emergency Medicine | Admitting: Emergency Medicine

## 2017-12-13 ENCOUNTER — Encounter (HOSPITAL_COMMUNITY): Payer: Self-pay

## 2017-12-13 DIAGNOSIS — Z79899 Other long term (current) drug therapy: Secondary | ICD-10-CM | POA: Diagnosis not present

## 2017-12-13 DIAGNOSIS — R569 Unspecified convulsions: Secondary | ICD-10-CM | POA: Diagnosis not present

## 2017-12-13 DIAGNOSIS — Z8673 Personal history of transient ischemic attack (TIA), and cerebral infarction without residual deficits: Secondary | ICD-10-CM | POA: Diagnosis not present

## 2017-12-13 DIAGNOSIS — Z7901 Long term (current) use of anticoagulants: Secondary | ICD-10-CM | POA: Diagnosis not present

## 2017-12-13 DIAGNOSIS — I6389 Other cerebral infarction: Secondary | ICD-10-CM | POA: Diagnosis not present

## 2017-12-13 DIAGNOSIS — F17210 Nicotine dependence, cigarettes, uncomplicated: Secondary | ICD-10-CM | POA: Insufficient documentation

## 2017-12-13 DIAGNOSIS — Z794 Long term (current) use of insulin: Secondary | ICD-10-CM | POA: Insufficient documentation

## 2017-12-13 DIAGNOSIS — R55 Syncope and collapse: Secondary | ICD-10-CM | POA: Diagnosis not present

## 2017-12-13 DIAGNOSIS — R5601 Complex febrile convulsions: Secondary | ICD-10-CM | POA: Diagnosis not present

## 2017-12-13 DIAGNOSIS — R404 Transient alteration of awareness: Secondary | ICD-10-CM | POA: Diagnosis not present

## 2017-12-13 DIAGNOSIS — R4182 Altered mental status, unspecified: Secondary | ICD-10-CM | POA: Diagnosis not present

## 2017-12-13 HISTORY — DX: Cerebral infarction, unspecified: I63.9

## 2017-12-13 LAB — CULTURE, BLOOD (ROUTINE X 2)
CULTURE: NO GROWTH
Culture: NO GROWTH
SPECIAL REQUESTS: ADEQUATE
Special Requests: ADEQUATE

## 2017-12-13 LAB — I-STAT TROPONIN, ED: Troponin i, poc: 0 ng/mL (ref 0.00–0.08)

## 2017-12-13 LAB — COMPREHENSIVE METABOLIC PANEL
ALT: 30 U/L (ref 14–54)
AST: 35 U/L (ref 15–41)
Albumin: 2.8 g/dL — ABNORMAL LOW (ref 3.5–5.0)
Alkaline Phosphatase: 78 U/L (ref 38–126)
Anion gap: 11 (ref 5–15)
BUN: 9 mg/dL (ref 6–20)
CALCIUM: 8.1 mg/dL — AB (ref 8.9–10.3)
CO2: 22 mmol/L (ref 22–32)
CREATININE: 0.58 mg/dL (ref 0.44–1.00)
Chloride: 102 mmol/L (ref 101–111)
Glucose, Bld: 210 mg/dL — ABNORMAL HIGH (ref 65–99)
Potassium: 3 mmol/L — ABNORMAL LOW (ref 3.5–5.1)
Sodium: 135 mmol/L (ref 135–145)
Total Bilirubin: 0.8 mg/dL (ref 0.3–1.2)
Total Protein: 5.7 g/dL — ABNORMAL LOW (ref 6.5–8.1)

## 2017-12-13 LAB — CBC
HCT: 36.1 % (ref 36.0–46.0)
Hemoglobin: 12.1 g/dL (ref 12.0–15.0)
MCH: 29.8 pg (ref 26.0–34.0)
MCHC: 33.5 g/dL (ref 30.0–36.0)
MCV: 88.9 fL (ref 78.0–100.0)
PLATELETS: 208 10*3/uL (ref 150–400)
RBC: 4.06 MIL/uL (ref 3.87–5.11)
RDW: 12.1 % (ref 11.5–15.5)
WBC: 6.6 10*3/uL (ref 4.0–10.5)

## 2017-12-13 LAB — CBG MONITORING, ED: GLUCOSE-CAPILLARY: 212 mg/dL — AB (ref 65–99)

## 2017-12-13 LAB — PROTIME-INR
INR: 1.08
Prothrombin Time: 13.9 seconds (ref 11.4–15.2)

## 2017-12-13 LAB — APTT: aPTT: 25 seconds (ref 24–36)

## 2017-12-13 MED ORDER — LEVETIRACETAM IN NACL 1000 MG/100ML IV SOLN
1000.0000 mg | Freq: Once | INTRAVENOUS | Status: AC
  Administered 2017-12-13: 1000 mg via INTRAVENOUS
  Filled 2017-12-13: qty 100

## 2017-12-13 MED ORDER — LEVETIRACETAM 500 MG PO TABS: 500.0000 mg | ORAL_TABLET | Freq: Two times a day (BID) | ORAL | 0 refills | Status: DC

## 2017-12-13 NOTE — ED Provider Notes (Signed)
I have personally seen and examined the patient. I have reviewed the documentation on PMH/FH/Soc Hx. I have discussed the plan of care with the resident and patient.  I have reviewed and agree with the resident's documentation. Please see associated encounter note.  Briefly, the patient is a 57 y.o. female here with AMS and likely seizure-like activity in the setting of recent CVA. No recent infectious symptoms, trauma. No associated chest pain, SOB, N/V. No abd pain. No prior cardiac hx. EKG reassuring. Trop negative. Labs reassuring. CT head revealed evolving stroke. Discussed with Neuro who felt that this is likely from her recent CVA. Recommended keppra. Will have close follow up. The patient appears reasonably screened and/or stabilized for discharge and I doubt any other medical condition or other [facility] requiring further screening, evaluation, or treatment in the ED at this time prior to discharge.  The patient is safe for discharge with strict return precautions.    EKG Interpretation  Date/Time:  Saturday December 13 2017 18:19:46 EDT Ventricular Rate:  79 PR Interval:    QRS Duration: 86 QT Interval:  380 QTC Calculation: 436 R Axis:   45 Text Interpretation:  Sinus rhythm No significant change since last tracing Confirmed by Drema Pryardama, Pedro [phone]) on 12/13/2017 8:29:06 PM         Cardama, Amadeo GarnetPedro Eduardo, MD 12/13/17 2138

## 2017-12-13 NOTE — ED Notes (Signed)
Pt. CBG was 212. Ladona Ridgelaylor, RN notified.

## 2017-12-13 NOTE — Progress Notes (Signed)
ON CALL NOTE  Received a call from ER for patient, recently discharged from stroke service presenting with seizure.  CTH shows evolving LMCA stroke. She is aphasic per EDP but returning back to baseline.She has outpatient f/u scheduled with [facility].  RECS: -Keppra 1000mg  IV x1 now -Keppra 500 BID po going forward -[facility]. -Maintain seizure precautions. Call with questions.  -- Milon DikesAshish Ramez Arrona, MD [facility] Pager: [phone] If 7pm to 7am, please call on call as listed on AMION.

## 2017-12-13 NOTE — Discharge Instructions (Addendum)
Please begin taking Keppra one 500 mg tablet two times a day to prevent further seizures. Make sure you follow up with Dr. Vickey Hugerohmeier (neurologist) as planned.   Please also schedule an appointment with your primary doctor within the next week.

## 2017-12-13 NOTE — ED Provider Notes (Signed)
[facility] Provider Note   CSN: [id number] Arrival date & time: 12/13/17  1809  History   Chief Complaint Chief Complaint  Patient presents with  . Altered Mental Status  . Seizures    HPI Brittany [name] is a 57 y.o. female presenting with AMS.  HPI  Majority of history provided by patient's husband and son, as patient with aphasia secondary to recent stroke.   Patient presents with AMS and questionable seizure-like activity. Patient was discharged from hospital yesterday after admission for L MCA stroke. She was started on Plavix and has been taking this as prescribed. This morning she was acting normally per husband and son's report. Her HH RN came to the house and noted that her temperature was 100.58F, but no other abnormalities. This afternoon she was sitting on the porch, when she suddenly began shaking. Her eyes also rolled back into her head. Her husband and son-in-law helped her walk inside the house, however she collapsed and appeared to lost consciousness. They picked her up and put her into her bed. Around that time she appeared to stop breathing. 15-20 seconds later, she gasped and seemed to regain consciousness. Shaking lasted about 5 minutes. Family denies urinary or fecal incontinence. Upon regaining consciousness, patient was very confused, and kept repeating "I don't want to die." EMS then arrived, and patient was unable to answer most orientation questions. [facility]   Patient is able to state her name, location, and the month. She says that she feels well now but is scared. She attempts to answer all questions asked, however answers are either unable to understand or are word salad.   Past Medical History:  Diagnosis Date  . Depression   . Diabetes mellitus without complication (HCC)   . Fibromyalgia   . High cholesterol   . Stroke [facility](HCC)     Patient Active Problem List   Diagnosis Date Noted  .  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 Acute right hemiparesis (HCC) 12/12/2017  . History of completed stroke 12/12/2017  . Leukocytosis 12/12/2017  . Hypokalemia 12/12/2017  . Hypotension   . 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 Poor compliance with CPAP treatment 03/04/2017  . OSA and COPD overlap syndrome (HCC) 03/04/2017  . Simple chronic bronchitis (HCC) 03/04/2017  . MCI (mild cognitive impairment) 11/27/2016  . Left sided lacunar infarction (HCC) 03/11/2014  . Cerebrovascular small vessel disease 03/11/2014  . Insomnia, idiopathic 12/20/2013  . Snoring 12/06/2013  . Tobacco use disorder 01/22/2013  . Essential hypertension, benign 10/26/2012  . Hyperlipidemia LDL goal <70 10/26/2012  . Polypharmacy 10/26/2012  . DM (diabetes mellitus) (HCC) 10/13/2011  . Tremor 10/13/2011  . GERD (gastroesophageal reflux disease) 10/13/2011  . Depression 10/13/2011    Past Surgical History:  Procedure Laterality Date  . CHOLECYSTECTOMY    . IR CT HEAD LTD  12/06/2017  . IR PERCUTANEOUS ART THROMBECTOMY/INFUSION INTRACRANIAL INC DIAG ANGIO  12/06/2017  . LOOP RECORDER INSERTION N/A 12/12/2017   Procedure: LOOP RECORDER INSERTION;  Surgeon: Regan Lemmingamnitz, Will Martin, MD;  Location: [facility] INVASIVE CV LAB;  Service: Cardiovascular;  Laterality: N/A;  . RADIOLOGY WITH ANESTHESIA N/A 12/06/2017   Procedure: CODE STROKE;  Surgeon: Julieanne Cottoneveshwar, Sanjeev, MD;  Location: [facility] OR;  Service: Radiology;  Laterality: N/A;  . SPINE SURGERY    . TEE  WITHOUT CARDIOVERSION N/A 12/10/2017   Procedure: TRANSESOPHAGEAL ECHOCARDIOGRAM (TEE);  Surgeon: Jake Bathe, MD;  Location: [facility] ENDOSCOPY;  Service: Cardiovascular;  Laterality: N/A;  . THROMBECTOMY FEMORAL ARTERY Right 12/07/2017   Procedure: THROMBECTOMY RIGHT FEMORAL ARTERY;  Surgeon: Maeola Harman,  MD;  Location: [facility] OR;  Service: Vascular;  Laterality: Right;  . TONSILLECTOMY       OB History   None      Home Medications    Prior to Admission medications   Medication Sig Start Date End Date Taking? Authorizing Provider  acetaminophen (TYLENOL) 500 MG tablet Take 1,000 mg by mouth every 6 (six) hours as needed for fever (pain).   Yes [provider]  atorvastatin (LIPITOR) 80 MG tablet Take 1 tablet (80 mg total) by mouth daily at 6 PM. 12/12/17  Yes Biby, Jani Files, NP  clopidogrel (PLAVIX) 75 MG tablet Take 1 tablet (75 mg total) by mouth daily. 12/13/17  Yes Layne Benton, NP  estradiol (ESTRACE) 0.1 MG/GM vaginal cream Apply to perineum at night x 2 weeks, then every other night. Patient taking differently: See admin instructions. Filled 07/30/17: Apply to perineum at night x 2 weeks, then every other night. 07/30/17  Yes [name], MD  sertraline (ZOLOFT) 100 MG tablet Take 2 tablets (200 mg total) by mouth at bedtime. 05/17/17  Yes [name], MD  albuterol (PROVENTIL) (2.5 MG/3ML) 0.083% nebulizer solution Take 3 mLs (2.5 mg total) by nebulization every 6 (six) hours as needed for wheezing or shortness of breath. Patient not taking: Reported on 12/07/2017 07/13/16   [name], MD  Albuterol Sulfate (PROAIR RESPICLICK) 108 (90 Base) MCG/ACT AEPB Inhale 2 puffs into the lungs every 4 (four) hours as needed. Patient not taking: Reported on 12/07/2017 07/13/16   [name], MD  B Complex-Biotin-FA (MULTI-B COMPLEX) CAPS Take one a day ,B complex  multivitamin over the counter or use prenatal vitamin. Patient not taking: Reported on 12/07/2017 08/28/16   Dohmeier, Porfirio Mylar, MD  BD PEN NEEDLE NANO U/F 32G X 4 MM MISC USE WITH VICTOZA PEN 05/23/17   [name], MD  insulin detemir (LEVEMIR) 100 UNIT/ML injection Inject 0.05 mLs (5 Units total) into the skin every 12 (twelve) hours. 12/12/17   Layne Benton, NP  levETIRAcetam (KEPPRA) 500 MG tablet Take 1 tablet (500 mg total) by  mouth 2 (two) times daily. 12/13/17   Marquette Saa, MD    Family History Family History  Problem Relation Age of Onset  . Cancer Mother   . Diabetes Mother   . Heart disease Mother   . Parkinsonism Mother   . Diabetes Father   . Heart disease Father   . Dementia Father   . Cancer Maternal Grandmother 62       lung  . COPD Maternal Aunt     Social History Social History   Tobacco Use  . Smoking status: Current Every Day Smoker    Packs/day: 1.00    Years: 2.00    Pack years: 2.00    Types: Cigarettes  . Smokeless tobacco: Never Used  Substance Use Topics  . Alcohol use: Yes    Comment: rarely  . Drug use: No     Allergies   Patient has no known allergies.   Review of Systems Review of Systems  Constitutional: Positive for fever.  Respiratory: Negative for shortness of breath.   Cardiovascular: Negative for chest pain.  Gastrointestinal: Negative for abdominal  pain.  Skin: Positive for pallor.  Neurological: Positive for seizures.  Psychiatric/Behavioral: Positive for confusion.   Physical Exam Updated Vital Signs BP 114/70   Pulse 78   Temp 98.5 F (36.9 C) (Oral)   Resp 18   Ht 5\' 3"  (1.6 m)   Wt 59.9 kg (132 lb)   SpO2 97%   BMI 23.38 kg/m   Physical Exam  Constitutional: She appears well-developed and well-nourished.  Sitting up in bed in NAD.   HENT:  Head: Normocephalic and atraumatic.  Nose: Nose normal.  Mouth/Throat: Oropharynx is clear and moist. No oropharyngeal exudate.  Eyes: Pupils are equal, round, and reactive to light. Conjunctivae and EOM are normal. Right eye exhibits no discharge. Left eye exhibits no discharge.  Neck: Normal range of motion. Neck supple.  Cardiovascular: Normal rate, regular rhythm and normal heart sounds.  No murmur heard. Pulmonary/Chest: Effort normal and breath sounds normal. No respiratory distress. She has no wheezes.  Abdominal: Soft. Bowel sounds are normal. She exhibits no distension.  There is no tenderness.  Musculoskeletal: Normal range of motion. She exhibits no edema.  Lymphadenopathy:    She has no cervical adenopathy.  Neurological:  Alert and appropriately interactive, however most answers to question are word salad. Able to verbalize name, location, and date. Unable to follow most commands. Grip strength equal bilaterally. Unable to follow commands for upper extremity strength testing otherwise. Full strength LE bilaterally. Unable to perform finger to nose.   Skin: Skin is warm and dry.  Psychiatric: She has a normal mood and affect. Her behavior is normal.   ED Treatments / Results  Labs (all labs ordered are listed, but only abnormal results are displayed) Labs Reviewed  COMPREHENSIVE METABOLIC PANEL - Abnormal; Notable for the following components:      Result Value   Potassium 3.0 (*)    Glucose, Bld 210 (*)    Calcium 8.1 (*)    Total Protein 5.7 (*)    Albumin 2.8 (*)    All other components within normal limits  CBG MONITORING, ED - Abnormal; Notable for the following components:   Glucose-Capillary 212 (*)    All other components within normal limits  APTT  CBC  PROTIME-INR  I-STAT TROPONIN, ED    EKG EKG Interpretation  Date/Time:  Saturday December 13 2017 18:19:46 EDT Ventricular Rate:  79 PR Interval:    QRS Duration: 86 QT Interval:  380 QTC Calculation: 436 R Axis:   45 Text Interpretation:  Sinus rhythm No significant change since last tracing Confirmed by Drema Pry [id number]) on 12/13/2017 8:29:06 PM   Radiology Ct Head Wo Contrast  Result Date: 12/13/2017 CLINICAL DATA:  Recent seizure like activity and altered mental status EXAM: CT HEAD WITHOUT CONTRAST TECHNIQUE: Contiguous axial images were obtained from the base of the skull through the vertex without intravenous contrast. COMPARISON:  12/07/2017 FINDINGS: Brain: There is an evolving infarct identified in the posterior aspect of the left MCA distribution extending from the  temporoparietal region superiorly. Dense distal left MCA branches are noted consistent with the infarct. No new focal infarct is seen. No hemorrhage is noted. Vascular: As described above.  No new hyperdense vessel is noted. Skull: Normal. Negative for fracture or focal lesion. Sinuses/Orbits: No acute finding. Other: None. IMPRESSION: Evolving infarct in the distribution of the left middle cerebral artery predominately posteriorly. This is similar to that seen on prior MRI. Electronically Signed   By: Alcide Clever M.D.   On: 12/13/2017  19:17    Procedures Procedures (including critical care time)  Medications Ordered in ED Medications  levETIRAcetam (KEPPRA) IVPB 1000 mg/100 mL premix (0 mg Intravenous Stopped 12/13/17 2040)     Initial Impression / Assessment and Plan / ED Course  I have reviewed the triage vital signs and the nursing notes.  Pertinent labs & imaging results that were available during my care of the patient were reviewed by me and considered in my medical decision making (see chart for details).     1847 Patient presents from home with AMS. Discharged yesterday after admission for L MCA stroke, with persistent aphasia. HPI more consistent with pre-syncopal episode followed by syncope rather than true seizure, as patient reportedly walking during shaking, which would be unlikely if she was having a true generalized seizure. Syncope could be 2/2 orthostasis, however patient was seated when sx began, so this is less likely. Could also be sequelae of recent CVA. Most aspects of neuro exam difficult to perform due to patient's inability to comprehend most commands. Currently is alert, looks well, and is denying any pain or confusion. CBG 212 so sx not due to hypoglycemia. EKG NSR and unchanged from EKG on 03/30. Other labs pending. Will obtain CT head to assess for new abnormalities. Will also get orthostatic vitals.   1930 CT head resulted. Evolving infarct in distribution of L MCA  similar to that seen on prior MRI. Will consult neuro regarding further recommendations.   54 Spoke with Dr. Wilford Corner (neuro), who felt that sx are likely consistent with seizure, especially upon reviewing patient's imaging. Recommends Keppra 1g IV now, and continue Keppra at 500mg  BID on discharge. Patient okay to f/u with Dr. Pearlean Brownie at [facility] in 4w as planned. Will give IV Keppra now and await lab results prior to discharge.   2128 Remaining labs resulted and are unremarkable. Patient stable for discharge home with meds and f/u as above.   Final Clinical Impressions(s) / ED Diagnoses   Final diagnoses:  Transient alteration of awareness    ED Discharge Orders        Ordered    levETIRAcetam (KEPPRA) 500 MG tablet  2 times daily     12/13/17 2129     Tarri Abernethy, MD, MPH PGY-3 [facility] Pager [phone]    Marquette Saa, MD 12/13/17 2136

## 2017-12-13 NOTE — ED Notes (Signed)
Patient transported to CT 

## 2017-12-13 NOTE — ED Triage Notes (Signed)
Pt from home with ems c.o AMS and seizure like activity witnessed by family. Unknown mental status baseline but pt has hx of stroke last month. Ems reports decreased grip strength in left side and aphasia. Pt alert and oriented to self and place, disoriented to time and situation. VSS

## 2017-12-15 ENCOUNTER — Telehealth: Payer: Self-pay

## 2017-12-15 ENCOUNTER — Telehealth: Payer: Self-pay | Admitting: Vascular Surgery

## 2017-12-15 ENCOUNTER — Telehealth: Payer: Self-pay | Admitting: Family Medicine

## 2017-12-15 DIAGNOSIS — I959 Hypotension, unspecified: Secondary | ICD-10-CM | POA: Diagnosis not present

## 2017-12-15 DIAGNOSIS — M797 Fibromyalgia: Secondary | ICD-10-CM | POA: Diagnosis not present

## 2017-12-15 DIAGNOSIS — F329 Major depressive disorder, single episode, unspecified: Secondary | ICD-10-CM | POA: Diagnosis not present

## 2017-12-15 DIAGNOSIS — I69351 Hemiplegia and hemiparesis following cerebral infarction affecting right dominant side: Secondary | ICD-10-CM | POA: Diagnosis not present

## 2017-12-15 DIAGNOSIS — F17210 Nicotine dependence, cigarettes, uncomplicated: Secondary | ICD-10-CM | POA: Diagnosis not present

## 2017-12-15 DIAGNOSIS — Z7902 Long term (current) use of antithrombotics/antiplatelets: Secondary | ICD-10-CM | POA: Diagnosis not present

## 2017-12-15 DIAGNOSIS — K219 Gastro-esophageal reflux disease without esophagitis: Secondary | ICD-10-CM | POA: Diagnosis not present

## 2017-12-15 DIAGNOSIS — Z794 Long term (current) use of insulin: Secondary | ICD-10-CM | POA: Diagnosis not present

## 2017-12-15 DIAGNOSIS — I10 Essential (primary) hypertension: Secondary | ICD-10-CM | POA: Diagnosis not present

## 2017-12-15 DIAGNOSIS — I69320 Aphasia following cerebral infarction: Secondary | ICD-10-CM | POA: Diagnosis not present

## 2017-12-15 DIAGNOSIS — E785 Hyperlipidemia, unspecified: Secondary | ICD-10-CM | POA: Diagnosis not present

## 2017-12-15 DIAGNOSIS — E119 Type 2 diabetes mellitus without complications: Secondary | ICD-10-CM | POA: Diagnosis not present

## 2017-12-15 NOTE — Telephone Encounter (Signed)
Phone call to Amy, verbal orders for home health speech therapy given. Amy verbalizes understanding.

## 2017-12-15 NOTE — Telephone Encounter (Signed)
Copied from CRM [id number]. Topic: Inquiry >> Dec 15, 2017 10:55 AM Alexander BergeronBarksdale, Harvey B wrote: Reason for CRM: [facility] is calling for verbal approval for therapy, contact [phone]

## 2017-12-15 NOTE — Telephone Encounter (Addendum)
[facility]. Verbal orders given for PT.   Phone call to Lupita LeashDonna at [facility]. Verbal orders given for nursing.   Lupita LeashDonna states that patient was admitted Saturday night after home health intake during daytime. She noted patient had a fever, but was surprised to hear patient went to the hospital that night after having a seizure. ED notes are in Epic for provider review.

## 2017-12-15 NOTE — Telephone Encounter (Signed)
Copied from CRM [id number]. Topic: Quick Communication - See Telephone Encounter >> Dec 15, 2017  4:15 PM Eston Mouldavis, Mihaela Fajardo B wrote: CRM for notification. See Telephone encounter for: 12/15/17. [facility] from [facility] is asking for verbal orders for  Home health Speech therapy 3 times a weeks for 2 weeks then 2 times a week for 2 more weeks.   Amys contact number is  [phone]

## 2017-12-15 NOTE — Telephone Encounter (Signed)
Spoke to pts son in law Gave appt info for 5/17 US x2 and OV  Mld lttr

## 2017-12-15 NOTE — Telephone Encounter (Signed)
Copied from CRM [id number]. Topic: Quick Communication - See Telephone Encounter >> Dec 15, 2017 10:54 AM Herby AbrahamJohnson, Shiquita C wrote: CRM for notification. See Telephone encounter for: 12/15/17.  Lupita LeashDonna with [facility] called in to get Point of Care orders and to also make provider aware that pt had a seizure this weekend.   Frequency: * 2 week 3 and 1 week 5 (nursing) - [phone] if can follow up as soon as possible

## 2017-12-15 NOTE — Telephone Encounter (Signed)
-----   Message from Sharee PimpleMarilyn K McChesney, RN sent at 12/12/2017 10:21 AM EDT ----- Regarding: 4-6 weeks w/ ABI and RLE duplex   ----- Message ----- From: Dara Lordshyne, Samantha J, PA-C Sent: 12/12/2017   8:05 AM To: Vvs Charge Pool  S/p Right lower extremity thromboembolectomy  F/u with Dr. Randie Heinzain in 4-6 weeks with ABI's and RLE duplex  Thanks

## 2017-12-16 ENCOUNTER — Other Ambulatory Visit: Payer: Self-pay

## 2017-12-16 DIAGNOSIS — Z7902 Long term (current) use of antithrombotics/antiplatelets: Secondary | ICD-10-CM | POA: Diagnosis not present

## 2017-12-16 DIAGNOSIS — F329 Major depressive disorder, single episode, unspecified: Secondary | ICD-10-CM | POA: Diagnosis not present

## 2017-12-16 DIAGNOSIS — M797 Fibromyalgia: Secondary | ICD-10-CM | POA: Diagnosis not present

## 2017-12-16 DIAGNOSIS — I69320 Aphasia following cerebral infarction: Secondary | ICD-10-CM | POA: Diagnosis not present

## 2017-12-16 DIAGNOSIS — I739 Peripheral vascular disease, unspecified: Secondary | ICD-10-CM

## 2017-12-16 DIAGNOSIS — E785 Hyperlipidemia, unspecified: Secondary | ICD-10-CM | POA: Diagnosis not present

## 2017-12-16 DIAGNOSIS — Z48812 Encounter for surgical aftercare following surgery on the circulatory system: Secondary | ICD-10-CM

## 2017-12-16 DIAGNOSIS — I10 Essential (primary) hypertension: Secondary | ICD-10-CM | POA: Diagnosis not present

## 2017-12-16 DIAGNOSIS — I959 Hypotension, unspecified: Secondary | ICD-10-CM | POA: Diagnosis not present

## 2017-12-16 DIAGNOSIS — Z794 Long term (current) use of insulin: Secondary | ICD-10-CM | POA: Diagnosis not present

## 2017-12-16 DIAGNOSIS — F17210 Nicotine dependence, cigarettes, uncomplicated: Secondary | ICD-10-CM | POA: Diagnosis not present

## 2017-12-16 DIAGNOSIS — I69351 Hemiplegia and hemiparesis following cerebral infarction affecting right dominant side: Secondary | ICD-10-CM | POA: Diagnosis not present

## 2017-12-16 DIAGNOSIS — E119 Type 2 diabetes mellitus without complications: Secondary | ICD-10-CM | POA: Diagnosis not present

## 2017-12-16 DIAGNOSIS — K219 Gastro-esophageal reflux disease without esophagitis: Secondary | ICD-10-CM | POA: Diagnosis not present

## 2017-12-16 NOTE — Telephone Encounter (Signed)
Perfect, thank you for authorizing therapy Luanne BrasLizzie!!!

## 2017-12-17 DIAGNOSIS — E785 Hyperlipidemia, unspecified: Secondary | ICD-10-CM | POA: Diagnosis not present

## 2017-12-17 DIAGNOSIS — F17210 Nicotine dependence, cigarettes, uncomplicated: Secondary | ICD-10-CM | POA: Diagnosis not present

## 2017-12-17 DIAGNOSIS — I69320 Aphasia following cerebral infarction: Secondary | ICD-10-CM | POA: Diagnosis not present

## 2017-12-17 DIAGNOSIS — I10 Essential (primary) hypertension: Secondary | ICD-10-CM | POA: Diagnosis not present

## 2017-12-17 DIAGNOSIS — K219 Gastro-esophageal reflux disease without esophagitis: Secondary | ICD-10-CM | POA: Diagnosis not present

## 2017-12-17 DIAGNOSIS — F329 Major depressive disorder, single episode, unspecified: Secondary | ICD-10-CM | POA: Diagnosis not present

## 2017-12-17 DIAGNOSIS — M797 Fibromyalgia: Secondary | ICD-10-CM | POA: Diagnosis not present

## 2017-12-17 DIAGNOSIS — I959 Hypotension, unspecified: Secondary | ICD-10-CM | POA: Diagnosis not present

## 2017-12-17 DIAGNOSIS — I69351 Hemiplegia and hemiparesis following cerebral infarction affecting right dominant side: Secondary | ICD-10-CM | POA: Diagnosis not present

## 2017-12-17 DIAGNOSIS — Z7902 Long term (current) use of antithrombotics/antiplatelets: Secondary | ICD-10-CM | POA: Diagnosis not present

## 2017-12-17 DIAGNOSIS — E119 Type 2 diabetes mellitus without complications: Secondary | ICD-10-CM | POA: Diagnosis not present

## 2017-12-17 DIAGNOSIS — Z794 Long term (current) use of insulin: Secondary | ICD-10-CM | POA: Diagnosis not present

## 2017-12-19 ENCOUNTER — Other Ambulatory Visit: Payer: Self-pay

## 2017-12-19 ENCOUNTER — Encounter: Payer: Self-pay | Admitting: Urgent Care

## 2017-12-19 ENCOUNTER — Ambulatory Visit: Payer: Self-pay | Admitting: *Deleted

## 2017-12-19 ENCOUNTER — Ambulatory Visit (INDEPENDENT_AMBULATORY_CARE_PROVIDER_SITE_OTHER): Payer: BLUE CROSS/BLUE SHIELD | Admitting: Urgent Care

## 2017-12-19 ENCOUNTER — Other Ambulatory Visit: Payer: Self-pay | Admitting: *Deleted

## 2017-12-19 ENCOUNTER — Ambulatory Visit (INDEPENDENT_AMBULATORY_CARE_PROVIDER_SITE_OTHER): Payer: BLUE CROSS/BLUE SHIELD

## 2017-12-19 VITALS — BP 134/78 | HR 78 | Temp 97.8°F | Resp 16 | Ht 63.0 in | Wt 131.0 lb

## 2017-12-19 DIAGNOSIS — E119 Type 2 diabetes mellitus without complications: Secondary | ICD-10-CM | POA: Diagnosis not present

## 2017-12-19 DIAGNOSIS — M797 Fibromyalgia: Secondary | ICD-10-CM | POA: Diagnosis not present

## 2017-12-19 DIAGNOSIS — I69320 Aphasia following cerebral infarction: Secondary | ICD-10-CM | POA: Diagnosis not present

## 2017-12-19 DIAGNOSIS — F329 Major depressive disorder, single episode, unspecified: Secondary | ICD-10-CM | POA: Diagnosis not present

## 2017-12-19 DIAGNOSIS — K5900 Constipation, unspecified: Secondary | ICD-10-CM

## 2017-12-19 DIAGNOSIS — R1084 Generalized abdominal pain: Secondary | ICD-10-CM

## 2017-12-19 DIAGNOSIS — F17210 Nicotine dependence, cigarettes, uncomplicated: Secondary | ICD-10-CM | POA: Diagnosis not present

## 2017-12-19 DIAGNOSIS — I959 Hypotension, unspecified: Secondary | ICD-10-CM | POA: Diagnosis not present

## 2017-12-19 DIAGNOSIS — E785 Hyperlipidemia, unspecified: Secondary | ICD-10-CM | POA: Diagnosis not present

## 2017-12-19 DIAGNOSIS — K219 Gastro-esophageal reflux disease without esophagitis: Secondary | ICD-10-CM | POA: Diagnosis not present

## 2017-12-19 DIAGNOSIS — I69351 Hemiplegia and hemiparesis following cerebral infarction affecting right dominant side: Secondary | ICD-10-CM | POA: Diagnosis not present

## 2017-12-19 DIAGNOSIS — Z794 Long term (current) use of insulin: Secondary | ICD-10-CM | POA: Diagnosis not present

## 2017-12-19 DIAGNOSIS — I10 Essential (primary) hypertension: Secondary | ICD-10-CM | POA: Diagnosis not present

## 2017-12-19 DIAGNOSIS — Z7902 Long term (current) use of antithrombotics/antiplatelets: Secondary | ICD-10-CM | POA: Diagnosis not present

## 2017-12-19 NOTE — Telephone Encounter (Signed)
Pt  Recently in Hospital  For  CVA -  Husband  Reports  No  bm  In 5   Days . No  History of Constipation . Pt  Able  To eat   And  Drink liquids  She  Has  Speech  Impairment  From  The CVA .  [facility]  [facility]  [facility]  Today  Per  Protocol . Dr Clelia CroftShaw  Schedule  Checked  No  Availability.     Reason for Disposition . Last bowel movement (BM) > 4 days ago  Answer Assessment - Initial Assessment Questions 1. STOOL PATTERN OR FREQUENCY: "How often do you pass bowel movements (BMs)?"  (Normal range: tid to q 3 days)  "When was the last BM passed?"     5  DAYS AGO 2. STRAINING: "Do you have to strain to have a BM?"     N/A    3. RECTAL PAIN: "Does your rectum hurt when the stool comes out?" If so, ask: "Do you have hemorrhoids? How bad is the pain?"  (Scale 1-10; or mild, moderate, severe)     NO 4. STOOL COMPOSITION: "Are the stools hard?"       N/A  5. BLOOD ON STOOLS: "Has there been any blood on the toilet tissue or on the surface of the BM?" If so, ask: "When was the last time?"     NO 6. CHRONIC CONSTIPATION: "Is this a new problem for you?"  If no, ask: How long have you had this problem?" (days, weeks, months)     NO  7. CHANGES IN DIET: "Have there been any recent changes in your diet?"     Eating  Less     Taking  Fluids    8. MEDICATIONS: "Have you been taking any new medications?"     New  Seizure   meds 9. LAXATIVES: "Have you been using any laxatives or enemas?"  If yes, ask "What, how often, and when was the last time?"       None  10. CAUSE: "What do you think is causing the constipation?"         Recent  CVA  SEIZURE    11. OTHER SYMPTOMS: "Do you have any other symptoms?" (e.g., abdominal pain, fever, vomiting)        NO 12. PREGNANCY: "Is there any chance you are pregnant?" "When was your last menstrual period?"        N/A  Protocols used: CONSTIPATION-A-AH

## 2017-12-19 NOTE — Progress Notes (Signed)
    MRN: [id number] DOB: 1960-11-18  Subjective:   Brittany [name] is a 57 y.o. female presenting for 5-day history of mild abdominal pain, has not had a bowel movement since Monday. Patient had a seizure on Saturday after her home health nurse left. Patient is still eating, drinking fluids.  Denies fever, nausea, vomiting, dysuria, hematuria, diarrhea, inability to pass gas.  Denies history of chronic constipation.  Brittany [name] has a current medication list which includes the following prescription(s): acetaminophen, atorvastatin, bd pen needle nano u/f, clopidogrel, estradiol, insulin detemir, levetiracetam, sertraline, albuterol, albuterol sulfate, and multi-b complex, and the following Facility-Administered Medications: gadopentetate dimeglumine. Also has No Known Allergies.  Brittany [name]  has a past medical history of Depression, Diabetes mellitus without complication (HCC), Fibromyalgia, High cholesterol, and Stroke (HCC). Also  has a past surgical history that includes Cholecystectomy; Spine surgery; Tonsillectomy; Thrombectomy femoral artery (Right, 12/07/2017); Radiology with anesthesia (N/A, 12/06/2017); IR PERCUTANEOUS ART THROMBECTOMY/INFUSION INTRACRANIAL INC DIAG ANGIO (12/06/2017); IR CT Head Ltd (12/06/2017); TEE without cardioversion (N/A, 12/10/2017); and LOOP RECORDER INSERTION (N/A, 12/12/2017).  Objective:   Vitals: BP 134/78   Pulse 78   Temp 97.8 F (36.6 C) (Oral)   Resp 16   Ht 5\' 3"  (1.6 m)   Wt 131 lb (59.4 kg)   SpO2 98%   BMI 23.21 kg/m   Physical Exam  Constitutional: She is oriented to person, place, and time. She appears well-developed and well-nourished.  Cardiovascular: Normal rate, regular rhythm and intact distal pulses. Exam reveals no gallop and no friction rub.  No murmur heard. Pulmonary/Chest: No respiratory distress. She has no wheezes. She has no rales.  Abdominal: Soft. Bowel sounds are normal. She exhibits no distension and no mass. There is no tenderness.    Musculoskeletal: She exhibits no edema.  Neurological: She is alert and oriented to person, place, and time.  Skin: Skin is warm and dry. No rash noted. No erythema. No pallor.    Dg Abd 2 Views  Result Date: 12/19/2017 CLINICAL DATA:  Generalized abdominal pain.  Constipation. EXAM: ABDOMEN - 2 VIEW COMPARISON:  CT abdomen and pelvis 12/08/2017 FINDINGS: Stool is present throughout the colon, small to moderate in volume. Residual barium is intermixed with the stool from a modified swallow study performed earlier this month. No dilated loops of bowel are seen to suggest obstruction. No intraperitoneal free air is identified. Right upper quadrant abdominal surgical clips and an implanted loop recorder are noted. The visualized lung bases are grossly clear. No acute osseous abnormality is seen. IMPRESSION: Small to moderate colonic stool volume without evidence of obstruction. Electronically Signed   By: Sebastian AcheAllen  Grady M.D.   On: 12/19/2017 16:25   Assessment and Plan :   Constipation, unspecified constipation type - Plan: DG Abd 2 Views  Generalized abdominal pain - Plan: DG Abd 2 Views  Will manage patient conservatively for constipation.  ER precautions reviewed.  For now patient will try Fleet enema, stool softener or MiraLAX.  Wallis BambergMario Grissel Tyrell, PA-C [facility] [phone] 12/19/2017  4:08 PM

## 2017-12-19 NOTE — Patient Instructions (Addendum)
Please use Miralax (polyethylene glycol) 17g for moderate to severe constipation. Take this once a day for the next 2-3 days. Please also start Colace (docusate) 50mg  stool softener, twice a day for at least 1 week. If stools become loose, cut down to once a day for another week. If stools remain loose, cut back to 1 pill every other day for a third week. You can stop docusate thereafter and resume as needed for constipation.  To help reduce constipation and promote bowel health: 1. Drink at least 64 ounces of water each day 2. Eat plenty of fiber (fruits, vegetables, whole grains, legumes) 3. Be physically active or exercise including walking, jogging, swimming, yoga, etc. 4. For active constipation use a stool softener (docusate) or an osmotic laxative (like Miralax) each day, or as needed.         Small Bowel Obstruction A small bowel obstruction means that something is blocking the small bowel. The small bowel is also called the small intestine. It is the long tube that connects the stomach to the colon. An obstruction will stop food and fluids from passing through the small bowel. Treatment depends on what is causing the problem and how bad the problem is. Follow these instructions at home:  Get a lot of rest.  Follow your diet as told by your doctor. You may need to: ? Only drink clear liquids until you start to get better. ? Avoid solid foods as told by your doctor.  Take over-the-counter and prescription medicines only as told by your doctor.  Keep all follow-up visits as told by your doctor. This is important. Contact a doctor if:  You have a fever.  You have chills. Get help right away if:  You have pain or cramps that get worse.  You throw up (vomit) blood.  You have a feeling of being sick to your stomach (nausea) that does not go away.  You cannot stop throwing up.  You cannot drink fluids.  You feel confused.  You feel dry or thirsty (dehydrated).  Your  belly gets more bloated.  You feel weak or you pass out (faint). This information is not intended to replace advice given to you by your health care provider. Make sure you discuss any questions you have with your health care provider. Document Released: 10/03/2004 Document Revised: 04/22/2016 Document Reviewed: 10/20/2014 Elsevier Interactive Patient Education  2018 [employer]      IF you received an x-ray today, you will receive an invoice from [facility]. Please contact [facility] at [phone] with questions or concerns regarding your invoice.   IF you received labwork today, you will receive an invoice from [facility]. Please contact [facility] at [phone] with questions or concerns regarding your invoice.   Our billing staff will not be able to assist you with questions regarding bills from these companies.  You will be contacted with the lab results as soon as they are available. The fastest way to get your results is to activate your My Chart account. Instructions are located on the last page of this paperwork. If you have not heard from us regarding the results in 2 weeks, please contact this office.

## 2017-12-21 DIAGNOSIS — K219 Gastro-esophageal reflux disease without esophagitis: Secondary | ICD-10-CM | POA: Diagnosis not present

## 2017-12-21 DIAGNOSIS — F329 Major depressive disorder, single episode, unspecified: Secondary | ICD-10-CM | POA: Diagnosis not present

## 2017-12-21 DIAGNOSIS — F17210 Nicotine dependence, cigarettes, uncomplicated: Secondary | ICD-10-CM | POA: Diagnosis not present

## 2017-12-21 DIAGNOSIS — I69351 Hemiplegia and hemiparesis following cerebral infarction affecting right dominant side: Secondary | ICD-10-CM | POA: Diagnosis not present

## 2017-12-21 DIAGNOSIS — Z7902 Long term (current) use of antithrombotics/antiplatelets: Secondary | ICD-10-CM | POA: Diagnosis not present

## 2017-12-21 DIAGNOSIS — I959 Hypotension, unspecified: Secondary | ICD-10-CM | POA: Diagnosis not present

## 2017-12-21 DIAGNOSIS — E785 Hyperlipidemia, unspecified: Secondary | ICD-10-CM | POA: Diagnosis not present

## 2017-12-21 DIAGNOSIS — Z794 Long term (current) use of insulin: Secondary | ICD-10-CM | POA: Diagnosis not present

## 2017-12-21 DIAGNOSIS — E119 Type 2 diabetes mellitus without complications: Secondary | ICD-10-CM | POA: Diagnosis not present

## 2017-12-21 DIAGNOSIS — I69320 Aphasia following cerebral infarction: Secondary | ICD-10-CM | POA: Diagnosis not present

## 2017-12-21 DIAGNOSIS — I10 Essential (primary) hypertension: Secondary | ICD-10-CM | POA: Diagnosis not present

## 2017-12-21 DIAGNOSIS — M797 Fibromyalgia: Secondary | ICD-10-CM | POA: Diagnosis not present

## 2017-12-22 DIAGNOSIS — M797 Fibromyalgia: Secondary | ICD-10-CM | POA: Diagnosis not present

## 2017-12-22 DIAGNOSIS — Z7902 Long term (current) use of antithrombotics/antiplatelets: Secondary | ICD-10-CM | POA: Diagnosis not present

## 2017-12-22 DIAGNOSIS — I959 Hypotension, unspecified: Secondary | ICD-10-CM | POA: Diagnosis not present

## 2017-12-22 DIAGNOSIS — I69351 Hemiplegia and hemiparesis following cerebral infarction affecting right dominant side: Secondary | ICD-10-CM | POA: Diagnosis not present

## 2017-12-22 DIAGNOSIS — E785 Hyperlipidemia, unspecified: Secondary | ICD-10-CM | POA: Diagnosis not present

## 2017-12-22 DIAGNOSIS — F329 Major depressive disorder, single episode, unspecified: Secondary | ICD-10-CM | POA: Diagnosis not present

## 2017-12-22 DIAGNOSIS — I69320 Aphasia following cerebral infarction: Secondary | ICD-10-CM | POA: Diagnosis not present

## 2017-12-22 DIAGNOSIS — I10 Essential (primary) hypertension: Secondary | ICD-10-CM | POA: Diagnosis not present

## 2017-12-22 DIAGNOSIS — Z794 Long term (current) use of insulin: Secondary | ICD-10-CM | POA: Diagnosis not present

## 2017-12-22 DIAGNOSIS — F17210 Nicotine dependence, cigarettes, uncomplicated: Secondary | ICD-10-CM | POA: Diagnosis not present

## 2017-12-22 DIAGNOSIS — K219 Gastro-esophageal reflux disease without esophagitis: Secondary | ICD-10-CM | POA: Diagnosis not present

## 2017-12-22 DIAGNOSIS — E119 Type 2 diabetes mellitus without complications: Secondary | ICD-10-CM | POA: Diagnosis not present

## 2017-12-23 ENCOUNTER — Ambulatory Visit (INDEPENDENT_AMBULATORY_CARE_PROVIDER_SITE_OTHER): Payer: Self-pay | Admitting: *Deleted

## 2017-12-23 DIAGNOSIS — I63412 Cerebral infarction due to embolism of left middle cerebral artery: Secondary | ICD-10-CM

## 2017-12-23 LAB — CUP PACEART INCLINIC DEVICE CHECK
Implantable Pulse Generator Implant Date: 20190405
MDC IDC SESS DTM: 20190416115119

## 2017-12-23 NOTE — Progress Notes (Signed)
Wound Loop check in clinic. Steri-Strips removed prior to OV, wound well healed, incision edges approximated Battery status: Good. R-waves  0.67 mV. 0 symptom episodes, 0 tachy episodes, 1  pause episode 3 sec long, per pts son pt had an seizure like episode where pt collapsed that was around the time of the pause episode recorded, pt went to the ED, no mention in ED notes of LINQ being checked, Pt D/C with Keppra.  0 brady episodes. 0 AF episodes. Pt's son given symptom activator and pt and son educated on how to use symptom activator and send in a manual transmission. Pt to f/u with WC 12/24/17 at 4:00pm

## 2017-12-24 ENCOUNTER — Encounter: Payer: Self-pay | Admitting: Cardiology

## 2017-12-24 ENCOUNTER — Ambulatory Visit (INDEPENDENT_AMBULATORY_CARE_PROVIDER_SITE_OTHER): Payer: BLUE CROSS/BLUE SHIELD | Admitting: Cardiology

## 2017-12-24 ENCOUNTER — Other Ambulatory Visit: Payer: Self-pay | Admitting: Cardiology

## 2017-12-24 VITALS — BP 132/72 | HR 78 | Ht 63.0 in | Wt 133.8 lb

## 2017-12-24 DIAGNOSIS — I69320 Aphasia following cerebral infarction: Secondary | ICD-10-CM | POA: Diagnosis not present

## 2017-12-24 DIAGNOSIS — E785 Hyperlipidemia, unspecified: Secondary | ICD-10-CM | POA: Diagnosis not present

## 2017-12-24 DIAGNOSIS — F17210 Nicotine dependence, cigarettes, uncomplicated: Secondary | ICD-10-CM | POA: Diagnosis not present

## 2017-12-24 DIAGNOSIS — I10 Essential (primary) hypertension: Secondary | ICD-10-CM | POA: Diagnosis not present

## 2017-12-24 DIAGNOSIS — E119 Type 2 diabetes mellitus without complications: Secondary | ICD-10-CM | POA: Diagnosis not present

## 2017-12-24 DIAGNOSIS — I639 Cerebral infarction, unspecified: Secondary | ICD-10-CM

## 2017-12-24 DIAGNOSIS — I495 Sick sinus syndrome: Secondary | ICD-10-CM | POA: Diagnosis not present

## 2017-12-24 DIAGNOSIS — Z794 Long term (current) use of insulin: Secondary | ICD-10-CM | POA: Diagnosis not present

## 2017-12-24 DIAGNOSIS — M797 Fibromyalgia: Secondary | ICD-10-CM | POA: Diagnosis not present

## 2017-12-24 DIAGNOSIS — F329 Major depressive disorder, single episode, unspecified: Secondary | ICD-10-CM | POA: Diagnosis not present

## 2017-12-24 DIAGNOSIS — Z7902 Long term (current) use of antithrombotics/antiplatelets: Secondary | ICD-10-CM | POA: Diagnosis not present

## 2017-12-24 DIAGNOSIS — I69351 Hemiplegia and hemiparesis following cerebral infarction affecting right dominant side: Secondary | ICD-10-CM | POA: Diagnosis not present

## 2017-12-24 DIAGNOSIS — K219 Gastro-esophageal reflux disease without esophagitis: Secondary | ICD-10-CM | POA: Diagnosis not present

## 2017-12-24 DIAGNOSIS — I959 Hypotension, unspecified: Secondary | ICD-10-CM | POA: Diagnosis not present

## 2017-12-24 NOTE — Patient Instructions (Addendum)
Medication Instructions:  Your physician recommends that you continue on your current medications as directed. Please refer to the Current Medication list given to you today.  Labwork: None ordered  Testing/Procedures: Your physician has recommended that you have a pacemaker inserted. A pacemaker is a small device that is placed under the skin of your chest or abdomen to help control abnormal heart rhythms. This device uses electrical pulses to prompt the heart to beat at a normal rate. Pacemakers are used to treat heart rhythms that are too slow. Wire (leads) are attached to the pacemaker that goes into the chambers of you heart. This is done in the hospital and usually requires and overnight stay. Please see the instruction section below under additional instructions.  Follow-Up: Your physician recommends that you schedule a follow-up appointment in: 10 - 14 days, after your procedure on 01/01/18, with device clinic for a wound check.  Your physician recommends that you schedule a follow-up appointment in: 91 days, after your procedure on 01/01/18, with Dr. Elberta Fortis  * If you need a refill on your cardiac medications before your next appointment, please call your pharmacy.   *Please note that any paperwork needing to be filled out by the provider will need to be addressed at the front desk prior to seeing the provider. Please note that any FMLA, disability or other documents regarding health condition is subject to a $25.00 charge that must be received prior to completion of paperwork in the form of a money order or check.  Thank you for choosing [facility]!!   Dory Horn, RN [phone]   Any Other Special Instructions Will Be Listed Below (If Applicable).  Implantable Device Instructions  You are scheduled for:                  _____ Implantable Cardioverter Defibrillator  on  01/01/2018  with Dr. Elberta Fortis.  1.   Please arrive at the [facility], Entrance "A"  at [facility] at  9:30 a.m.  on the day of your procedure. (The address is [address])  2. Do not eat or drink after midnight the night before your procedure.  3.   You will have lab work in the hospital the morning of procedure.  4.   A) - Take 1/2 your usual dose of bedtime insulin the night before your procedure.            - Hold all of your morning medications the morning of your procedure.  5.  Plan for an overnight stay.  Bring your insurance cards and a list of you medications.  6.  Wash your chest and neck with surgical scrub the evening before and the morning of      your procedure.  Rinse well. Please review the surgical scrub instruction sheet given       to you.  7. Your chest will need to be shaved prior to this procedure (if needed). We ask that you do this yourself at home 1 to 2 days before or if uncomfortable/unable to do yourself, then it will be performed by the hospital staff the day of.                                                                                                                *  If you have ANY questions after you get home, please call Dory HornSherri Jestine Bicknell, RN @ [phone].  * Every attempt is made to prevent procedures from being rescheduled.  Due to the nature of  Electrophysiology, rescheduling can happen.  The physician is always aware and directs the staff when this occurs.     Pacemaker Implantation, Adult Pacemaker implantation is a procedure to place a pacemaker inside your chest. A pacemaker is a small computer that sends electrical signals to the heart and helps your heart beat normally. A pacemaker also stores information about your heart rhythms. You may need pacemaker implantation if you:  Have a slow heartbeat (bradycardia).  Faint (syncope).  Have shortness of breath (dyspnea) due to heart problems.  The pacemaker attaches to your heart through a wire, called a lead. Sometimes just one lead is needed. Other times, there will  be two leads. There are two types of pacemakers:  Transvenous pacemaker. This type is placed under the skin or muscle of your chest. The lead goes through a vein in the chest area to reach the inside of the heart.  Epicardial pacemaker. This type is placed under the skin or muscle of your chest or belly. The lead goes through your chest to the outside of the heart.  Tell a health care provider about:  Any allergies you have.  All medicines you are taking, including vitamins, herbs, eye drops, creams, and over-the-counter medicines.  Any problems you or family members have had with anesthetic medicines.  Any blood or bone disorders you have.  Any surgeries you have had.  Any medical conditions you have.  Whether you are pregnant or may be pregnant. What are the risks? Generally, this is a safe procedure. However, problems may occur, including:  Infection.  Bleeding.  Failure of the pacemaker or the lead.  Collapse of a lung or bleeding into a lung.  Blood clot inside a blood vessel with a lead.  Damage to the heart.  Infection inside the heart (endocarditis).  Allergic reactions to medicines.  What happens before the procedure? Staying hydrated Follow instructions from your health care provider about hydration, which may include:  Up to 2 hours before the procedure - you may continue to drink clear liquids, such as water, clear fruit juice, black coffee, and plain tea.  Eating and drinking restrictions Follow instructions from your health care provider about eating and drinking, which may include:  8 hours before the procedure - stop eating heavy meals or foods such as meat, fried foods, or fatty foods.  6 hours before the procedure - stop eating light meals or foods, such as toast or cereal.  6 hours before the procedure - stop drinking milk or drinks that contain milk.  2 hours before the procedure - stop drinking clear liquids.  Medicines  Ask your health  care provider about: ? Changing or stopping your regular medicines. This is especially important if you are taking diabetes medicines or blood thinners. ? Taking medicines such as aspirin and ibuprofen. These medicines can thin your blood. Do not take these medicines before your procedure if your health care provider instructs you not to.  You may be given antibiotic medicine to help prevent infection. General instructions  You will have a heart evaluation. This may include an electrocardiogram (ECG), chest X-ray, and heart imaging (echocardiogram,  or echo) tests.  You will have blood tests.  Do not use any products that contain nicotine or tobacco, such as cigarettes and  e-cigarettes. If you need help quitting, ask your health care provider.  Plan to have someone take you home from the hospital or clinic.  If you will be going home right after the procedure, plan to have someone with you for 24 hours.  Ask your health care provider how your surgical site will be marked or identified. What happens during the procedure?  To reduce your risk of infection: ? Your health care team will wash or sanitize their hands. ? Your skin will be washed with soap. ? Hair may be removed from the surgical area.  An IV tube will be inserted into one of your veins.  You will be given one or more of the following: ? A medicine to help you relax (sedative). ? A medicine to numb the area (local anesthetic). ? A medicine to make you fall asleep (general anesthetic).  If you are getting a transvenous pacemaker: ? An incision will be made in your upper chest. ? A pocket will be made for the pacemaker. It may be placed under the skin or between layers of muscle. ? The lead will be inserted into a blood vessel that returns to the heart. ? While X-rays are taken by an imaging machine (fluoroscopy), the lead will be advanced through the vein to the inside of your heart. ? The other end of the lead will be  tunneled under the skin and attached to the pacemaker.  If you are getting an epicardial pacemaker: ? An incision will be made near your ribs or breastbone (sternum) for the lead. ? The lead will be attached to the outside of your heart. ? Another incision will be made in your chest or upper belly to create a pocket for the pacemaker. ? The free end of the lead will be tunneled under the skin and attached to the pacemaker.  The transvenous or epicardial pacemaker will be tested. Imaging studies may be done to check the lead position.  The incisions will be closed with stitches (sutures), adhesive strips, or skin glue.  Bandages (dressing) will be placed over the incisions. The procedure may vary among health care providers and hospitals. What happens after the procedure?  Your blood pressure, heart rate, breathing rate, and blood oxygen level will be monitored until the medicines you were given have worn off.  You will be given antibiotics and pain medicine.  ECG and chest x-rays will be done.  You will wear a continuous type of ECG (Holter monitor) to check your heart rhythm.  Your health care provider willprogram the pacemaker.  Do not drive for 24 hours if you received a sedative. This information is not intended to replace advice given to you by your health care provider. Make sure you discuss any questions you have with your health care provider. Document Released: 08/16/2002 Document Revised: 03/15/2016 Document Reviewed: 02/07/2016 Elsevier Interactive Patient Education  [employer].

## 2017-12-24 NOTE — Progress Notes (Signed)
Electrophysiology Office Note   Date:  12/25/2017   ID:  Brittany [name], [name] 10-Aug-1961, MRN [id number]  PCP:  [name], MD  Cardiologist:   Primary Electrophysiologist:  Usman Millett Jorja Loa, MD    Chief Complaint  Patient presents with  . Pacemaker Check    Syncope/C. stroke/PAF     History of Present Illness: Brittany [name] is a 57 y.o. female who is being seen today for the evaluation of cryptogenic stroke at the request of [name], MD. Presenting today for electrophysiology evaluation.  A history of hypertension, hyperlipidemia, prior CVA, and tobacco abuse.  She presented to the emergency room April 2019 with global aphasia.  Head CT showed acute left insular infarct with suspected M2 embolus.  She received TPA.     Today, she denies symptoms of palpitations, chest pain, shortness of breath, orthopnea, PND, lower extremity edema, claudication, dizziness, presyncope, bleeding, or neurologic sequela. The patient is tolerating medications without difficulties.  The day after her hospital sedation, she had an episode of syncope.  She was sitting on the front porch with her grandchildren.  She had an episode where her eyes rolled back in her head and she lost consciousness.  She regained consciousness a few seconds later, but was weak and fatigued.  Her Linq monitor was interrogated which showed a 3-second pause at the time.  She also had junctional escape rhythm after her 3-second pause.  This correlates to the time of her episode of syncope.   Past Medical History:  Diagnosis Date  . Depression   . Diabetes mellitus without complication (HCC)   . Fibromyalgia   . High cholesterol   . Stroke [facility])    Past Surgical History:  Procedure Laterality Date  . CHOLECYSTECTOMY    . IR CT HEAD LTD  12/06/2017  . IR PERCUTANEOUS ART THROMBECTOMY/INFUSION INTRACRANIAL INC DIAG ANGIO  12/06/2017  . LOOP RECORDER INSERTION N/A 12/12/2017   Procedure: LOOP RECORDER INSERTION;  Surgeon:  Regan Lemming, MD;  Location: [facility] INVASIVE CV LAB;  Service: Cardiovascular;  Laterality: N/A;  . RADIOLOGY WITH ANESTHESIA N/A 12/06/2017   Procedure: CODE STROKE;  Surgeon: Julieanne Cotton, MD;  Location: [facility] OR;  Service: Radiology;  Laterality: N/A;  . SPINE SURGERY    . TEE WITHOUT CARDIOVERSION N/A 12/10/2017   Procedure: TRANSESOPHAGEAL ECHOCARDIOGRAM (TEE);  Surgeon: Jake Bathe, MD;  Location: [facility] ENDOSCOPY;  Service: Cardiovascular;  Laterality: N/A;  . THROMBECTOMY FEMORAL ARTERY Right 12/07/2017   Procedure: THROMBECTOMY RIGHT FEMORAL ARTERY;  Surgeon: Maeola Harman, MD;  Location: [facility] OR;  Service: Vascular;  Laterality: Right;  . TONSILLECTOMY       Current Outpatient Medications  Medication Sig Dispense Refill  . acetaminophen (TYLENOL) 500 MG tablet Take 1,000 mg by mouth every 6 (six) hours as needed for fever (pain).    Marland Kitchen albuterol (PROVENTIL) (2.5 MG/3ML) 0.083% nebulizer solution Take 3 mLs (2.5 mg total) by nebulization every 6 (six) hours as needed for wheezing or shortness of breath. 150 mL 1  . atorvastatin (LIPITOR) 80 MG tablet Take 1 tablet (80 mg total) by mouth daily at 6 PM. 30 tablet 2  . B Complex-Biotin-FA (MULTI-B COMPLEX) CAPS Take one a day ,B complex  multivitamin over the counter or use prenatal vitamin. 90 capsule 0  . BD PEN NEEDLE NANO U/F 32G X 4 MM MISC USE WITH VICTOZA PEN 100 each 11  . clopidogrel (PLAVIX) 75 MG tablet Take 1 tablet (75  mg total) by mouth daily. 30 tablet 2  . estradiol (ESTRACE) 0.1 MG/GM vaginal cream Apply to perineum at night x 2 weeks, then every other night. (Patient taking differently: See admin instructions. Filled 07/30/17: Apply to perineum at night x 2 weeks, then every other night.) 42.5 g 3  . insulin detemir (LEVEMIR) 100 UNIT/ML injection Inject 0.05 mLs (5 Units total) into the skin every 12 (twelve) hours. 10 mL 11  . levETIRAcetam (KEPPRA) 500 MG tablet Take 1 tablet (500 mg total) by mouth 2 (two)  times daily. 60 tablet 0  . sertraline (ZOLOFT) 100 MG tablet Take 2 tablets (200 mg total) by mouth at bedtime. 180 tablet 3   No current facility-administered medications for this visit.    Facility-Administered Medications Ordered in Other Visits  Medication Dose Route Frequency Provider Last Rate Last Dose  . gadopentetate dimeglumine (MAGNEVIST) injection 15 mL  15 mL Intravenous Once PRN Dohmeier, Porfirio Mylar, MD        Allergies:   Patient has no known allergies.   Social History:  The patient  reports that she has been smoking cigarettes.  She has a 2.00 pack-year smoking history. She has never used smokeless tobacco. She reports that she drinks alcohol. She reports that she does not use drugs.   Family History:  The patient's family history includes COPD in her maternal aunt; Cancer in her mother; Cancer (age of onset: 25) in her maternal grandmother; Dementia in her father; Diabetes in her father and mother; Heart disease in her father and mother; Parkinsonism in her mother.    ROS:  Please see the history of present illness.   Otherwise, review of systems is positive for speech difficulty, syncope.   All other systems are reviewed and negative.    PHYSICAL EXAM: VS:  BP 132/72   Pulse 78   Ht 5\' 3"  (1.6 m)   Wt 133 lb 12.8 oz (60.7 kg)   SpO2 96%   BMI 23.70 kg/m  , BMI Body mass index is 23.7 kg/m. GEN: Well nourished, well developed, in no acute distress  HEENT: normal  Neck: no JVD, carotid bruits, or masses Cardiac: RRR; no murmurs, rubs, or gallops,no edema  Respiratory:  clear to auscultation bilaterally, normal work of breathing GI: soft, nontender, nondistended, + BS MS: no deformity or atrophy  Skin: warm and dry, device pocket is well healed Neuro:  Strength and sensation are intact Psych: euthymic mood, full affect  EKG:  EKG is not ordered today. Personal review of the ekg ordered 12/13/17 shows sinus rhythm, rate 79  Device interrogation is reviewed today  in detail.  See PaceArt for details.   Recent Labs: 07/30/2017: TSH 1.430 12/08/2017: Magnesium 1.8 12/13/2017: ALT 30; BUN 9; Creatinine, Ser 0.58; Hemoglobin 12.1; Platelets 208; Potassium 3.0; Sodium 135    Lipid Panel     Component Value Date/Time   CHOL 161 12/07/2017 0443   CHOL 156 05/17/2017 1119   TRIG 75 12/07/2017 0443   HDL 40 (L) 12/07/2017 0443   HDL 55 05/17/2017 1119   CHOLHDL 4.0 12/07/2017 0443   VLDL 15 12/07/2017 0443   LDLCALC 106 (H) 12/07/2017 0443   LDLCALC 81 05/17/2017 1119   LDLDIRECT 61 12/03/2013 0852     Wt Readings from Last 3 Encounters:  12/24/17 133 lb 12.8 oz (60.7 kg)  12/19/17 131 lb (59.4 kg)  12/13/17 132 lb (59.9 kg)      Other studies Reviewed: Additional studies/ records that were reviewed  today include: TTE 12/08/2017 Review of the above records today demonstrates:  - Left ventricle: The cavity size was normal. Wall thickness was   normal. Systolic function was normal. The estimated ejection   fraction was in the range of 55% to 60%. Wall motion was normal;   there were no regional wall motion abnormalities. Features are   consistent with a pseudonormal left ventricular filling pattern,   with concomitant abnormal relaxation and increased filling   pressure (grade 2 diastolic dysfunction).   ASSESSMENT AND PLAN:  1.  Cryptogenic stroke: This post Linq monitor.  No atrial fibrillation seen.  2.  Syncope: Correlates with significant pauses on her Linq monitor.  The pauses 3 seconds but she did have junctional escape's after.  I discussed with him the possibility of pacemaker implant versus watchful waiting.  Both the patient and her family member feel that the pacemaker implant would be beneficial.  Risks and benefits were discussed.  Risks include bleeding, tamponade, infection, pneumothorax.  The patient understands the risks and is agreed to the procedure.  Current medicines are reviewed at length with the patient today.   The  patient does not have concerns regarding her medicines.  The following changes were made today:  none  Labs/ tests ordered today include:  No orders of the defined types were placed in this encounter.    Disposition:   FU with Yanet Balliet 3 months  Signed, Amand Lemoine Jorja LoaMartin Musab Wingard, MD  12/25/2017 8:16 AM     [facility] [address] [place] [address] [phone] (office) [phone] (fax)

## 2017-12-25 ENCOUNTER — Other Ambulatory Visit: Payer: Self-pay

## 2017-12-25 ENCOUNTER — Encounter: Payer: Self-pay | Admitting: Family Medicine

## 2017-12-25 ENCOUNTER — Ambulatory Visit (INDEPENDENT_AMBULATORY_CARE_PROVIDER_SITE_OTHER): Payer: BLUE CROSS/BLUE SHIELD | Admitting: Family Medicine

## 2017-12-25 VITALS — BP 120/60 | HR 80 | Temp 98.4°F | Resp 18 | Ht 63.0 in | Wt 134.0 lb

## 2017-12-25 DIAGNOSIS — I10 Essential (primary) hypertension: Secondary | ICD-10-CM | POA: Diagnosis not present

## 2017-12-25 DIAGNOSIS — E876 Hypokalemia: Secondary | ICD-10-CM | POA: Diagnosis not present

## 2017-12-25 DIAGNOSIS — J449 Chronic obstructive pulmonary disease, unspecified: Secondary | ICD-10-CM

## 2017-12-25 DIAGNOSIS — Z794 Long term (current) use of insulin: Secondary | ICD-10-CM

## 2017-12-25 DIAGNOSIS — Z7902 Long term (current) use of antithrombotics/antiplatelets: Secondary | ICD-10-CM | POA: Diagnosis not present

## 2017-12-25 DIAGNOSIS — E440 Moderate protein-calorie malnutrition: Secondary | ICD-10-CM

## 2017-12-25 DIAGNOSIS — F17210 Nicotine dependence, cigarettes, uncomplicated: Secondary | ICD-10-CM | POA: Diagnosis not present

## 2017-12-25 DIAGNOSIS — F329 Major depressive disorder, single episode, unspecified: Secondary | ICD-10-CM | POA: Diagnosis not present

## 2017-12-25 DIAGNOSIS — F17200 Nicotine dependence, unspecified, uncomplicated: Secondary | ICD-10-CM | POA: Diagnosis not present

## 2017-12-25 DIAGNOSIS — G4733 Obstructive sleep apnea (adult) (pediatric): Secondary | ICD-10-CM

## 2017-12-25 DIAGNOSIS — I6602 Occlusion and stenosis of left middle cerebral artery: Secondary | ICD-10-CM

## 2017-12-25 DIAGNOSIS — I69351 Hemiplegia and hemiparesis following cerebral infarction affecting right dominant side: Secondary | ICD-10-CM | POA: Diagnosis not present

## 2017-12-25 DIAGNOSIS — I959 Hypotension, unspecified: Secondary | ICD-10-CM | POA: Diagnosis not present

## 2017-12-25 DIAGNOSIS — E1165 Type 2 diabetes mellitus with hyperglycemia: Secondary | ICD-10-CM | POA: Diagnosis not present

## 2017-12-25 DIAGNOSIS — E785 Hyperlipidemia, unspecified: Secondary | ICD-10-CM | POA: Diagnosis not present

## 2017-12-25 DIAGNOSIS — I69320 Aphasia following cerebral infarction: Secondary | ICD-10-CM | POA: Diagnosis not present

## 2017-12-25 DIAGNOSIS — K219 Gastro-esophageal reflux disease without esophagitis: Secondary | ICD-10-CM | POA: Diagnosis not present

## 2017-12-25 DIAGNOSIS — M797 Fibromyalgia: Secondary | ICD-10-CM | POA: Diagnosis not present

## 2017-12-25 DIAGNOSIS — E119 Type 2 diabetes mellitus without complications: Secondary | ICD-10-CM | POA: Diagnosis not present

## 2017-12-25 LAB — GLUCOSE, POCT (MANUAL RESULT ENTRY): POC Glucose: 402 mg/dl — AB (ref 70–99)

## 2017-12-25 MED ORDER — INSULIN DETEMIR 100 UNIT/ML FLEXPEN: 10.0000 [IU] | PEN_INJECTOR | Freq: Two times a day (BID) | SUBCUTANEOUS | 0 refills | Status: DC

## 2017-12-25 MED ORDER — INSULIN PEN NEEDLE 32G X 4 MM MISC: 11 refills | Status: DC

## 2017-12-25 NOTE — Patient Instructions (Addendum)
IF you received an x-ray today, you will receive an invoice from [facility]. Please contact [facility] at [phone] with questions or concerns regarding your invoice.   IF you received labwork today, you will receive an invoice from [facility]. Please contact [facility] at [phone] with questions or concerns regarding your invoice.   Our billing staff will not be able to assist you with questions regarding bills from these companies.  You will be contacted with the lab results as soon as they are available. The fastest way to get your results is to activate your My Chart account. Instructions are located on the last page of this paperwork. If you have not heard from [place] regarding the results in 2 weeks, please contact this office.     Insulin Treatment for Diabetes Diabetes (diabetes mellitus) is a long-term (chronic) disease. It occurs when the body does not properly use sugar (glucose) that is released from food after digestion. Glucose levels are controlled by a hormone called insulin, which is made in the pancreas.  If you have type 1 diabetes, the pancreas does not make any insulin, so you must take insulin.  If you have type 2 diabetes, you might need to take insulin along with other medicines. In type 2 diabetes, one or both of these problems may be present: ? The pancreas does not make enough insulin. ? Cells in the body do not respond properly to insulin that the body makes (insulin resistance).  You must use insulin correctly to control your diabetes. You must have some insulin in your body at all times. Insulin treatment varies depending on your type of diabetes, your treatment goals, and your medical history. It is important for you to understand your insulin treatment plan so you can be an active partner in managing your diabetes. How is insulin given? Insulin can only be given through a shot (injection). It is injected using a syringe and needle, an  insulin pen, a pump, or a jet injector. Your health care provider will:  Prescribe the amount and type of insulin that you need.  Tell you when you should inject your insulin.  Where on the body should insulin be injected? Insulin is injected into a layer of fatty tissue under the skin. Good places to inject insulin include:  Abdomen. Generally, the abdomen is the best place to inject insulin. However, you should avoid any area that is less than 2 inches (5 cm) from the belly button (navel).  Front and outer area of the upper thighs.  The back of the upper arms.  Upper buttocks.  It is important to:  Give your injection in a slightly different place each time. This helps to prevent irritation and improve absorption.  Avoid injecting into areas that have scar tissue.  Usually, you will give yourself insulin injections. Others can also be taught how to give you injections. You will use a special type of syringe that is made only for insulin. Some people may have an insulin pump that delivers insulin steadily through a tube (cannula) that is placed under the skin. What are the different types of insulin? The following information is a general guide to different types of insulin. Specifics vary depending on the insulin product that your health care provider prescribes.  Rapid-acting insulin: ? Starts working quickly, in as little as 5 minutes. ? Can last for 4-6 hours, or sometimes longer. ? Works well when taken right before a meal to quickly lower blood glucose.  Short-acting insulin: ? Starts working in about 30 minutes. ? Can last for 6-10 hours. ? Should be taken about 30 minutes before you start eating a meal.  Intermediate-acting insulin: ? Starts working in 1-2 hours. ? Lasts for about 10-18 hours. ? Lowers your blood glucose for a longer period of time but is not as effective for lowering blood glucose right after a meal.  Long-acting insulin: ? Mimics the small amount  of insulin that your pancreas usually produces throughout the day. ? Should be used either one or two times a day. ? Is usually used in combination with other types of insulin or other medicines.  Concentrated insulin, or U-500 insulin: ? Contains a higher dose of insulin than most rapid-acting insulins. U-500 insulin has 5 times the amount of insulin per 1 mL. ? Should only be used with the special U-500 syringe or U-500 insulin pen. It is dangerous to use the wrong type of syringe with this insulin.  What are the side effects of insulin? Possible side effects of insulin treatment include:  Low blood glucose (hypoglycemia).  Weight gain.  High blood glucose (hyperglycemia).  Skin injury or irritation.  Some of these side effects can be caused by using improper injection technique. It is important to learn to inject insulin properly. What are common terms associated with insulin treatment? Some terms that you might hear include:  Basal insulin, or basal rate. This is the constant amount of insulin that needs to be present in your body to stabilize your blood glucose levels. People who have type 1 diabetes need basal insulin in a steady (continuous) dose 24 hours a day. ? Usually, intermediate-acting or long-acting insulin is used one or two times a day to manage basal insulin levels. ? Medicines that are taken by mouth may also be recommended to manage basal insulin levels.  Prandial insulin. This refers to meal-related insulin. ? Blood glucose rises quickly after a meal (postprandial). Rapid-acting or short-acting insulin can be used right before a meal (preprandial) to quickly lower blood glucose. ? You may be instructed to adjust the amount of prandial insulin that you take depending on how much carbohydrate (starch) is in your meal.  Corrective insulin. This may also be called a correction dose or supplemental dose. This is a small amount of rapid-acting or short-acting insulin  that can be used to lower blood glucose if it is too high. You may be instructed to check your blood glucose at certain times of the day and use corrective insulin as needed.  Tight control, or intensive therapy. This means keeping your blood glucose as close to your target as possible, and preventing it from getting too high after meals. People who have tight control of their diabetes have fewer long-term problems caused by diabetes.  General instructions  Talk with your health care provider or pharmacist about the type of insulin you should take and when you should take it. You should know when your insulin peaks and when it wears off. You need this information so you can plan your meals and exercise. You also need to work with your health care provider to:  Check your blood glucose every day. Your health care provider will tell you how often and when you should do this.  Manage your: ? Weight. ? Blood pressure. ? Cholesterol. ? Stress.  Eat a healthy diet.  Exercise regularly.  This information is not intended to replace advice given to you by your health care provider. Make  sure you discuss any questions you have with your health care provider. Document Released: 11/22/2008 Document Revised: 02/01/2016 Document Reviewed: 09/29/2015 Elsevier Interactive Patient Education  2018 [employer].  Insulin Injection Instructions, Using Insulin Pens, Adult A subcutaneous injection is a shot of medicine that is injected into the layer of fat between skin and muscle. People with type 1 diabetes must take insulin because their bodies do not make it. People with type 2 diabetes may need to take insulin. There are many different types of insulin. The type of insulin that you take may determine how many injections you give yourself and when you need to take the injections. Choosing a site for injection Insulin absorption varies from site to site. It is best to inject insulin within the same body  area, using a different spot in that area for each injection. Do not inject the insulin in the same spot for each injection. There are five main areas that can be used for injecting. These areas include:  Abdomen. This is the preferred area.  Front of thigh.  Upper, outer side of thigh.  Back of upper arm.  Buttocks.  Using an insulin pen First, follow the steps for Getting Ready, then continue with the steps for Injecting the Insulin. Getting Ready 1. Wash your hands with soap and water. If soap and water are not available, use hand sanitizer. 2. Check the expiration date and type of insulin in the pen. 3. If you are using CLEAR insulin, check to see that it is clear and free of clumps. 4. If you are using CLOUDY insulin, gently roll the pen between your palms several times, or tip the pen up and down several times to mix up the medicine. Do not shake the pen. 5. Remove the cap from the insulin pen. 6. Use an alcohol wipe to clean the rubber stopper of the pen cartridge. 7. Remove the protective paper tab from the disposable needle. Do not let the needle touch anything. 8. Screw the needle onto the pen. 9. Remove the outer and inner plastic covers from the needle. Do not throw away the outer plastic cover yet. 10. Prime the insulin pen by turning the button (dial) to 2 units. Hold the pen with the needle pointing up, and push the button on the opposite end of the pen until a drop of insulin appears at the needle tip. If no insulin appears, repeat this step. 11. Dial the number of units of insulin that you will be injecting. Injecting the Insulin  1. Use an alcohol wipe to clean the site where you will be injecting the needle. Let the site air-dry. 2. Hold the pen in the palm of your writing hand with your thumb on the top. 3. If directed by your health care provider, use your other hand to pinch and hold about an inch of skin at the injection site. Do not directly touch the cleaned  part of the skin. 4. Gently but quickly, put the needle straight into the skin. The needle should be at a 90-degree angle (perpendicular) to the skin, as if to form the letter "L." ? For example, if you are giving an injection in the abdomen, the abdomen forms one "leg" of the "L" and the needle forms the other "leg" of the "L." 5. For adults who have a small amount of body fat, the needle may need to be injected at a 45-degree angle instead. Your health care provider will tell you if this is  necessary. ? A 45-degree angle looks like the letter "V." 6. When the needle is completely inserted into the skin, use the thumb of your writing hand to push the top button of the pen down all the way to inject the insulin. 7. Let go of the skin that you are pinching. Continue to hold the pen in place with your writing hand. 8. Wait five seconds, then pull the needle straight out of the skin. 9. Carefully put the larger (outer) plastic cover of the needle back over the needle, then unscrew the capped needle and discard it in a sharps container, such as an empty plastic bottle with a cover. 10. Put the plastic cap back on the insulin pen. Throwing away supplies  Discard all used needles in a puncture-proof sharps disposal container. You can ask your local pharmacy about where you can get this kind of disposal container, or you can use an empty liquid laundry detergent bottle that has a cover.  Follow the disposal regulations for the area where you live. Do not use any needle more than one time.  Throw away empty disposable pens in the regular trash. What questions should I ask my health care provider?  How often should I be taking insulin?  How often should I check my blood glucose?  What amount of insulin should I be taking at each time?  What are the side effects?  What should I do if my blood glucose is too high?  What should I do if my blood glucose is too low?  What should I do if I forget to  take my insulin?  What number should I call if I have questions? Where can I get more information?  American Diabetes Association (ADA): www.diabetes.org  American Association of Diabetes Educators (AADE) Patient Resources: https://www.diabeteseducator.org/patient-resources This information is not intended to replace advice given to you by your health care provider. Make sure you discuss any questions you have with your health care provider. Document Released: 09/29/2015 Document Revised: 02/01/2016 Document Reviewed: 09/29/2015 Elsevier Interactive Patient Education  [employer]2018 Elsevier Inc.

## 2017-12-25 NOTE — Progress Notes (Signed)
By signing my name below, I, Brittany [name], attest that this documentation has been prepared under the direction and in the presence of Dr. Levell JulyEva N. Clelia [name]. Electronically Signed: Marcelline MatesSchuyler [name], Medical Scribe 12/25/2017 at 11:47 AM.  Subjective:    Patient ID: Brittany [name], female    DOB: 21-Mar-1961, 57 y.o.   MRN: [id number] No chief complaint on file.   HPI Brittany CampusDenise W [name] is a 57 y.o. female who presents to Primary Care at [facility] for a follow up from her recent hospitalizations. The pt is accompanied by her son who is helping to provide history.   Brittany [name] was hospitalized after Left MCA and embolic CVA s/p TPA and right femoral thrombectomy. Hospitalized 3/30-4/5; a loop recorder was inserted prior to discharge. The following day, she was at home when noted to have a low grade temperature, was found to be altered with seizure-like activity. EMS was called, and she was transferred to the ER, due to the CVA she persisted with. At the ER, pt was started on Keppra 500 BID and arranged to follow up outpatient with neurology. She has an appointment with neurology NP 5/6. She was seen by cardiology yesterday, 12/24/17, for sick sinus syndrome. The patient's seizure-like episode was found to be correlated with a 3 second pause followed by junctional escape rhythm, unlikely to be seizure. Pt has been scheduled a pacemaker insertion on 01/01/18, in one week.   Last labs were done in the ER where she was hypokalemic with a potassium of 3 and malnourished. The pt notes that she did not get to eat for 3 days after presenting to the hospital. The patient's son notes being told in the hospital that her HR was possibly correlated to the seizure-like activity.   Type Two Diabetes:  Patient's Type 2 diabetic Invokamet was discontinued as was metformin and viktoza and Januvia. She was discharged instead on Levemir insulin 5 units BID. She was taking Primidone for essential tremor which has been discontinued and her  Losartan is held as well. Fortunately, patient's renal function did not appear to be affected. The patient's son notes that her blood glucose levels has been in the 100s and 200s. She denies syncope, sweating and shaking and blood glucose levels in the 70s or 80s.   Bowel Movements: Brittany BlonderDenise was seen 6 days ago by my colleague for 5 days of no bowel movement and abdominal pain. She was advised to try Miralax daily for 2-3 days with Colace BID and a fleets enema. The pt has been having her bowel movements daily since being seen by my colleague. Her appetite has been strong and she has been eating very well.   The pt also notes that she has not smoked any cigarettes since being in the hospital.   The pt notes that she feels tired overall but notes that she wakes up feeling well-rested. She denies feeling depressed at this time.    Patient Active Problem List   Diagnosis Date Noted  . 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 Acute right hemiparesis (HCC) 12/12/2017  . History of completed stroke 12/12/2017  . Leukocytosis 12/12/2017  . Hypokalemia 12/12/2017  . Hypotension   . 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 Poor compliance with CPAP treatment 03/04/2017  . OSA and COPD overlap syndrome (HCC) 03/04/2017  . Simple chronic bronchitis (HCC) 03/04/2017  .  MCI (mild cognitive impairment) 11/27/2016  . Left sided lacunar infarction (HCC) 03/11/2014  . Cerebrovascular small vessel disease 03/11/2014  . Insomnia, idiopathic 12/20/2013  . Snoring 12/06/2013  . Tobacco use disorder 01/22/2013  . Essential hypertension, benign 10/26/2012  . Hyperlipidemia LDL goal <70 10/26/2012  . Polypharmacy 10/26/2012  . DM (diabetes mellitus) (HCC) 10/13/2011  . Tremor 10/13/2011  . GERD (gastroesophageal reflux disease) 10/13/2011  . Depression  10/13/2011   Past Medical History:  Diagnosis Date  . Depression   . Diabetes mellitus without complication (HCC)   . Fibromyalgia   . High cholesterol   . Stroke [facility])    Past Surgical History:  Procedure Laterality Date  . CHOLECYSTECTOMY    . IR CT HEAD LTD  12/06/2017  . IR PERCUTANEOUS ART THROMBECTOMY/INFUSION INTRACRANIAL INC DIAG ANGIO  12/06/2017  . LOOP RECORDER INSERTION N/A 12/12/2017   Procedure: LOOP RECORDER INSERTION;  Surgeon: Regan Lemming, MD;  Location: [facility] INVASIVE CV LAB;  Service: Cardiovascular;  Laterality: N/A;  . RADIOLOGY WITH ANESTHESIA N/A 12/06/2017   Procedure: CODE STROKE;  Surgeon: Julieanne Cotton, MD;  Location: [facility] OR;  Service: Radiology;  Laterality: N/A;  . SPINE SURGERY    . TEE WITHOUT CARDIOVERSION N/A 12/10/2017   Procedure: TRANSESOPHAGEAL ECHOCARDIOGRAM (TEE);  Surgeon: Jake Bathe, MD;  Location: [facility] ENDOSCOPY;  Service: Cardiovascular;  Laterality: N/A;  . THROMBECTOMY FEMORAL ARTERY Right 12/07/2017   Procedure: THROMBECTOMY RIGHT FEMORAL ARTERY;  Surgeon: Maeola Harman, MD;  Location: [facility] OR;  Service: Vascular;  Laterality: Right;  . TONSILLECTOMY     No Known Allergies Prior to Admission medications   Medication Sig Start Date End Date Taking? Authorizing Provider  acetaminophen (TYLENOL) 500 MG tablet Take 1,000 mg by mouth every 6 (six) hours as needed for fever (pain).    [provider]  albuterol (PROVENTIL) (2.5 MG/3ML) 0.083% nebulizer solution Take 3 mLs (2.5 mg total) by nebulization every 6 (six) hours as needed for wheezing or shortness of breath. 07/13/16   [name], MD  atorvastatin (LIPITOR) 80 MG tablet Take 1 tablet (80 mg total) by mouth daily at 6 PM. 12/12/17   Layne Benton, NP  B Complex-Biotin-FA (MULTI-B COMPLEX) CAPS Take one a day ,B complex  multivitamin over the counter or use prenatal vitamin. 08/28/16   Dohmeier, Porfirio Mylar, MD  BD PEN NEEDLE NANO U/F 32G X 4 MM MISC USE WITH VICTOZA  PEN 05/23/17   [name], MD  clopidogrel (PLAVIX) 75 MG tablet Take 1 tablet (75 mg total) by mouth daily. 12/13/17   Layne Benton, NP  estradiol (ESTRACE) 0.1 MG/GM vaginal cream Apply to perineum at night x 2 weeks, then every other night. Patient taking differently: See admin instructions. Filled 07/30/17: Apply to perineum at night x 2 weeks, then every other night. 07/30/17   [name], MD  insulin detemir (LEVEMIR) 100 UNIT/ML injection Inject 0.05 mLs (5 Units total) into the skin every 12 (twelve) hours. 12/12/17   Layne Benton, NP  levETIRAcetam (KEPPRA) 500 MG tablet Take 1 tablet (500 mg total) by mouth 2 (two) times daily. 12/13/17   Marquette Saa, MD  sertraline (ZOLOFT) 100 MG tablet Take 2 tablets (200 mg total) by mouth at bedtime. 05/17/17   [name], MD   Social History   Socioeconomic History  . Marital status: Married    Spouse name: [name]  . Number of children: 2  . Years of education:  14  . Highest education level: Not on file  Occupational History  . Not on file  Social Needs  . Financial resource strain: Not on file  . Food insecurity:    Worry: Not on file    Inability: Not on file  . Transportation needs:    Medical: Not on file    Non-medical: Not on file  Tobacco Use  . Smoking status: Current Every Day Smoker    Packs/day: 1.00    Years: 2.00    Pack years: 2.00    Types: Cigarettes  . Smokeless tobacco: Never Used  Substance and Sexual Activity  . Alcohol use: Yes    Comment: rarely  . Drug use: No  . Sexual activity: Never  Lifestyle  . Physical activity:    Days per week: Not on file    Minutes per session: Not on file  . Stress: Not on file  Relationships  . Social connections:    Talks on phone: Not on file    Gets together: Not on file    Attends religious service: Not on file    Active member of club or organization: Not on file    Attends meetings of clubs or organizations: Not on file    Relationship status: Not on  file  . Intimate partner violence:    Fear of current or ex partner: Not on file    Emotionally abused: Not on file    Physically abused: Not on file    Forced sexual activity: Not on file  Other Topics Concern  . Not on file  Social History Narrative   Patient is married ([name]) and lives at home with her family   Patient has two children.   Patient works at [employer].   Patient has a college education.   Patient is right-handed.   Patient drinks 3 cups of coffee M-F.      Depression screen [facility] 2/9 01/23/2018 12/25/2017 12/19/2017 07/30/2017 05/17/2017  Decreased Interest 0 0 0 0 0  Down, Depressed, Hopeless 0 0 0 0 0  PHQ - 2 Score 0 0 0 0 0  Altered sleeping - - - - -  Tired, decreased energy - - - - -  Change in appetite - - - - -  Feeling bad or failure about yourself  - - - - -  Trouble concentrating - - - - -  Moving slowly or fidgety/restless - - - - -  Suicidal thoughts - - - - -  PHQ-9 Score - - - - -  Difficult doing work/chores - - - - -     Review of Systems  Constitutional: Positive for appetite change (Increased). Negative for chills, diaphoresis and fever.  HENT: Negative for congestion and sneezing.   Respiratory: Negative for cough.   Gastrointestinal: Negative for constipation.  Musculoskeletal: Negative for gait problem.  Neurological: Positive for speech difficulty (Improving). Negative for syncope.  Psychiatric/Behavioral: Negative for dysphoric mood.       Objective:   Physical Exam  Constitutional: She is oriented to person, place, and time. She appears well-developed and well-nourished.  HENT:  Head: Normocephalic and atraumatic.  Right Ear: External ear normal.  Left Ear: External ear normal.  Neck: No thyromegaly present.  Cardiovascular: Normal rate, regular rhythm and normal heart sounds.  Pulmonary/Chest: Effort normal and breath sounds normal.  Lymphadenopathy:    She has cervical adenopathy.    She has no axillary  adenopathy.  Neurological: She is alert  and oriented to person, place, and time.  Answers Y/N questions correctly. Will speak in 1-2 sentences, with second half of sentences seeming to be word salad.   Skin: Skin is warm and dry.  Psychiatric: She has a normal mood and affect. Her behavior is normal. Judgment and thought content normal.   Vitals:   12/25/17 1151  BP: 120/60  Pulse: 80  Resp: 18  Temp: 98.4 F (36.9 C)  TempSrc: Oral  SpO2: 97%  Weight: 134 lb (60.8 kg)  Height: 5\' 3"  (1.6 m)     Assessment & Plan:   1. Middle cerebral artery embolism, left   2. OSA and COPD overlap syndrome (HCC)   3. Type 2 diabetes mellitus with hyperglycemia, with long-term current use of insulin (HCC)   4. Tobacco use disorder   5. Essential hypertension, benign   6. Hypokalemia   7. Moderate malnutrition (HCC)     Orders Placed This Encounter  Procedures  . Comprehensive metabolic panel  . POCT glucose (manual entry)    Meds ordered this encounter  Medications  . Insulin Detemir (LEVEMIR FLEXTOUCH) 100 UNIT/ML Pen    Sig: Inject 10 Units into the skin 2 (two) times daily.    Dispense:  30 mL    Refill:  0    Want to change to pens instead of vial with next insulin fill. Sig change  . Insulin Pen Needle (BD PEN NEEDLE NANO U/F) 32G X 4 MM MISC    Sig: USE WITH Levemir PEN    Dispense:  100 each    Refill:  11    I personally performed the services described in this documentation, which was scribed in my presence. The recorded information has been reviewed and considered, and addended by me as needed.   Norberto Sorenson, M.D.  Primary Care at [facility] [address] [place], [place] [address] [phone] phone [phone] fax  01/23/18 3:18 PM

## 2017-12-26 LAB — COMPREHENSIVE METABOLIC PANEL
ALK PHOS: 129 IU/L — AB (ref 39–117)
ALT: 16 IU/L (ref 0–32)
AST: 14 IU/L (ref 0–40)
Albumin/Globulin Ratio: 1.7 (ref 1.2–2.2)
Albumin: 4 g/dL (ref 3.5–5.5)
BILIRUBIN TOTAL: 0.3 mg/dL (ref 0.0–1.2)
BUN/Creatinine Ratio: 15 (ref 9–23)
BUN: 8 mg/dL (ref 6–24)
CHLORIDE: 104 mmol/L (ref 96–106)
CO2: 22 mmol/L (ref 20–29)
Calcium: 9.4 mg/dL (ref 8.7–10.2)
Creatinine, Ser: 0.54 mg/dL — ABNORMAL LOW (ref 0.57–1.00)
GFR calc Af Amer: 120 mL/min/{1.73_m2} (ref >59)
GFR calc non Af Amer: 104 mL/min/{1.73_m2} (ref >59)
GLUCOSE: 383 mg/dL — AB (ref 65–99)
Globulin, Total: 2.4 g/dL (ref 1.5–4.5)
Potassium: 3.9 mmol/L (ref 3.5–5.2)
Sodium: 140 mmol/L (ref 134–144)
Total Protein: 6.4 g/dL (ref 6.0–8.5)

## 2017-12-29 ENCOUNTER — Encounter: Payer: Self-pay | Admitting: Family Medicine

## 2017-12-30 ENCOUNTER — Telehealth: Payer: Self-pay | Admitting: Family Medicine

## 2017-12-30 NOTE — Telephone Encounter (Signed)
Did not need verbal orders. Was just an [place].

## 2017-12-30 NOTE — Telephone Encounter (Unsigned)
Copied from CRM [id number]. Topic: Quick Communication - See Telephone Encounter >> Dec 30, 2017 11:40 AM Raquel SarnaHayes, Teresa G wrote: Brittany DerryAllison [name] - [facility] - [phone] - Speech Therapist - substitute   [place] beach [place] 1st part of this [place], missing 2 of her ST appts.  Pt has surgery at the end of the week. Verbal orders; To resume the week of April 29th with Speech Therapy

## 2018-01-01 ENCOUNTER — Other Ambulatory Visit: Payer: Self-pay | Admitting: Cardiology

## 2018-01-01 ENCOUNTER — Ambulatory Visit (HOSPITAL_COMMUNITY)
Admission: RE | Admit: 2018-01-01 | Discharge: 2018-01-02 | Disposition: A | Discharge status: Home / Self Care | Payer: BLUE CROSS/BLUE SHIELD | Source: Ambulatory Visit | Attending: Cardiology | Admitting: Cardiology

## 2018-01-01 ENCOUNTER — Encounter (HOSPITAL_COMMUNITY): Payer: Self-pay | Admitting: General Practice

## 2018-01-01 ENCOUNTER — Encounter (HOSPITAL_COMMUNITY)
Admission: RE | Disposition: A | Discharge status: Home / Self Care | Payer: Self-pay | Source: Ambulatory Visit | Attending: Cardiology

## 2018-01-01 ENCOUNTER — Other Ambulatory Visit: Payer: Self-pay

## 2018-01-01 DIAGNOSIS — Z95818 Presence of other cardiac implants and grafts: Secondary | ICD-10-CM

## 2018-01-01 DIAGNOSIS — R55 Syncope and collapse: Secondary | ICD-10-CM | POA: Diagnosis present

## 2018-01-01 DIAGNOSIS — I442 Atrioventricular block, complete: Secondary | ICD-10-CM

## 2018-01-01 DIAGNOSIS — Z4509 Encounter for adjustment and management of other cardiac device: Secondary | ICD-10-CM | POA: Insufficient documentation

## 2018-01-01 DIAGNOSIS — E785 Hyperlipidemia, unspecified: Secondary | ICD-10-CM | POA: Insufficient documentation

## 2018-01-01 DIAGNOSIS — Z8673 Personal history of transient ischemic attack (TIA), and cerebral infarction without residual deficits: Secondary | ICD-10-CM | POA: Diagnosis not present

## 2018-01-01 DIAGNOSIS — Z794 Long term (current) use of insulin: Secondary | ICD-10-CM | POA: Insufficient documentation

## 2018-01-01 DIAGNOSIS — Z7902 Long term (current) use of antithrombotics/antiplatelets: Secondary | ICD-10-CM | POA: Insufficient documentation

## 2018-01-01 DIAGNOSIS — R001 Bradycardia, unspecified: Secondary | ICD-10-CM | POA: Diagnosis not present

## 2018-01-01 DIAGNOSIS — I10 Essential (primary) hypertension: Secondary | ICD-10-CM | POA: Diagnosis not present

## 2018-01-01 DIAGNOSIS — E119 Type 2 diabetes mellitus without complications: Secondary | ICD-10-CM | POA: Diagnosis not present

## 2018-01-01 DIAGNOSIS — Z87891 Personal history of nicotine dependence: Secondary | ICD-10-CM | POA: Insufficient documentation

## 2018-01-01 DIAGNOSIS — I495 Sick sinus syndrome: Secondary | ICD-10-CM

## 2018-01-01 HISTORY — DX: Low back pain, unspecified: M54.50

## 2018-01-01 HISTORY — DX: Low back pain: M54.5

## 2018-01-01 HISTORY — DX: Other chronic pain: G89.29

## 2018-01-01 HISTORY — PX: INSERT / REPLACE / REMOVE PACEMAKER: SUR710

## 2018-01-01 HISTORY — PX: LOOP RECORDER REMOVAL: EP1215

## 2018-01-01 HISTORY — PX: PACEMAKER IMPLANT: EP1218

## 2018-01-01 HISTORY — DX: Type 2 diabetes mellitus without complications: E11.9

## 2018-01-01 LAB — GLUCOSE, CAPILLARY
GLUCOSE-CAPILLARY: 182 mg/dL — AB (ref 65–99)
Glucose-Capillary: 172 mg/dL — ABNORMAL HIGH (ref 65–99)
Glucose-Capillary: 117 mg/dL — ABNORMAL HIGH (ref 65–99)

## 2018-01-01 LAB — CBC
HCT: 39.9 % (ref 36.0–46.0)
Hemoglobin: 13.4 g/dL (ref 12.0–15.0)
MCH: 30.1 pg (ref 26.0–34.0)
MCHC: 33.6 g/dL (ref 30.0–36.0)
MCV: 89.7 fL (ref 78.0–100.0)
PLATELETS: 210 10*3/uL (ref 150–400)
RBC: 4.45 MIL/uL (ref 3.87–5.11)
RDW: 12.3 % (ref 11.5–15.5)
WBC: 7 10*3/uL (ref 4.0–10.5)

## 2018-01-01 SURGERY — PACEMAKER IMPLANT

## 2018-01-01 MED ORDER — ACETAMINOPHEN 325 MG PO TABS: 325.0000 mg | ORAL_TABLET | ORAL | Status: DC | PRN

## 2018-01-01 MED ORDER — INSULIN ASPART 100 UNIT/ML ~~LOC~~ SOLN: 0.0000 [IU] | Freq: Every day | SUBCUTANEOUS | Status: DC

## 2018-01-01 MED ORDER — ONDANSETRON HCL 4 MG/2ML IJ SOLN: 4.0000 mg | Freq: Four times a day (QID) | INTRAMUSCULAR | Status: DC | PRN

## 2018-01-01 MED ORDER — ALBUTEROL SULFATE (2.5 MG/3ML) 0.083% IN NEBU: 2.5000 mg | INHALATION_SOLUTION | Freq: Four times a day (QID) | RESPIRATORY_TRACT | Status: DC | PRN

## 2018-01-01 MED ORDER — FENTANYL CITRATE (PF) 100 MCG/2ML IJ SOLN
INTRAMUSCULAR | Status: DC | PRN
  Administered 2018-01-01: 25 ug via INTRAVENOUS

## 2018-01-01 MED ORDER — LIDOCAINE HCL (PF) 1 % IJ SOLN
INTRAMUSCULAR | Status: AC
  Filled 2018-01-01: qty 30

## 2018-01-01 MED ORDER — LIDOCAINE HCL (PF) 1 % IJ SOLN
INTRAMUSCULAR | Status: DC | PRN
  Administered 2018-01-01: 60 mL via INTRADERMAL

## 2018-01-01 MED ORDER — INSULIN ASPART 100 UNIT/ML ~~LOC~~ SOLN: 0.0000 [IU] | SUBCUTANEOUS | Status: DC

## 2018-01-01 MED ORDER — ATORVASTATIN CALCIUM 80 MG PO TABS
80.0000 mg | ORAL_TABLET | Freq: Every day | ORAL | Status: DC
  Administered 2018-01-01: 80 mg via ORAL
  Filled 2018-01-01: qty 1

## 2018-01-01 MED ORDER — INSULIN DETEMIR 100 UNIT/ML ~~LOC~~ SOLN
10.0000 [IU] | Freq: Two times a day (BID) | SUBCUTANEOUS | Status: DC
  Administered 2018-01-01 – 2018-01-02 (×2): 10 [IU] via SUBCUTANEOUS
  Filled 2018-01-01 (×2): qty 0.1

## 2018-01-01 MED ORDER — CEFAZOLIN SODIUM-DEXTROSE 2-4 GM/100ML-% IV SOLN
INTRAVENOUS | Status: AC
  Filled 2018-01-01: qty 100

## 2018-01-01 MED ORDER — ACETAMINOPHEN 500 MG PO TABS: 1000.0000 mg | ORAL_TABLET | Freq: Four times a day (QID) | ORAL | Status: DC | PRN

## 2018-01-01 MED ORDER — SODIUM CHLORIDE 0.9 % IV SOLN
INTRAVENOUS | Status: DC
  Administered 2018-01-01: 10:00:00 via INTRAVENOUS

## 2018-01-01 MED ORDER — MUPIROCIN 2 % EX OINT
TOPICAL_OINTMENT | CUTANEOUS | Status: AC
  Filled 2018-01-01: qty 22

## 2018-01-01 MED ORDER — LEVETIRACETAM 500 MG PO TABS
500.0000 mg | ORAL_TABLET | Freq: Two times a day (BID) | ORAL | Status: DC
  Administered 2018-01-01 – 2018-01-02 (×2): 500 mg via ORAL
  Filled 2018-01-01 (×2): qty 1

## 2018-01-01 MED ORDER — YOU HAVE A PACEMAKER BOOK
Freq: Once | Status: AC
  Administered 2018-01-01: 1
  Filled 2018-01-01: qty 1

## 2018-01-01 MED ORDER — MIDAZOLAM HCL 5 MG/5ML IJ SOLN
INTRAMUSCULAR | Status: DC | PRN
  Administered 2018-01-01 (×2): 1 mg via INTRAVENOUS

## 2018-01-01 MED ORDER — MIDAZOLAM HCL 5 MG/5ML IJ SOLN
INTRAMUSCULAR | Status: AC
  Filled 2018-01-01: qty 5

## 2018-01-01 MED ORDER — SODIUM CHLORIDE 0.9 % IV SOLN
80.0000 mg | INTRAVENOUS | Status: AC
  Administered 2018-01-01: 80 mg

## 2018-01-01 MED ORDER — CEFAZOLIN SODIUM-DEXTROSE 2-4 GM/100ML-% IV SOLN
2.0000 g | INTRAVENOUS | Status: AC
  Administered 2018-01-01: 2 g via INTRAVENOUS
  Filled 2018-01-01: qty 100

## 2018-01-01 MED ORDER — CLOPIDOGREL BISULFATE 75 MG PO TABS
75.0000 mg | ORAL_TABLET | Freq: Every day | ORAL | Status: DC
  Administered 2018-01-01 – 2018-01-02 (×2): 75 mg via ORAL
  Filled 2018-01-01 (×2): qty 1

## 2018-01-01 MED ORDER — SERTRALINE HCL 50 MG PO TABS
200.0000 mg | ORAL_TABLET | Freq: Every day | ORAL | Status: DC
  Administered 2018-01-01: 22:00:00 200 mg via ORAL
  Filled 2018-01-01: qty 4

## 2018-01-01 MED ORDER — SODIUM CHLORIDE 0.9 % IV SOLN
INTRAVENOUS | Status: AC
  Filled 2018-01-01: qty 2

## 2018-01-01 MED ORDER — FENTANYL CITRATE (PF) 100 MCG/2ML IJ SOLN
INTRAMUSCULAR | Status: AC
  Filled 2018-01-01: qty 2

## 2018-01-01 MED ORDER — HEPARIN (PORCINE) IN NACL 1000-0.9 UT/500ML-% IV SOLN
INTRAVENOUS | Status: AC
  Filled 2018-01-01: qty 500

## 2018-01-01 MED ORDER — HEPARIN (PORCINE) IN NACL 2-0.9 UNITS/ML
INTRAMUSCULAR | Status: AC | PRN
  Administered 2018-01-01: 500 mL

## 2018-01-01 MED ORDER — INSULIN ASPART 100 UNIT/ML ~~LOC~~ SOLN
0.0000 [IU] | Freq: Three times a day (TID) | SUBCUTANEOUS | Status: DC
  Administered 2018-01-02: 2 [IU] via SUBCUTANEOUS

## 2018-01-01 MED ORDER — CEFAZOLIN SODIUM-DEXTROSE 1-4 GM/50ML-% IV SOLN
1.0000 g | Freq: Four times a day (QID) | INTRAVENOUS | Status: AC
  Administered 2018-01-01 – 2018-01-02 (×3): 1 g via INTRAVENOUS
  Filled 2018-01-01 (×3): qty 50

## 2018-01-01 MED ORDER — OFF THE BEAT BOOK
Freq: Once | Status: AC
  Administered 2018-01-01: 1
  Filled 2018-01-01: qty 1

## 2018-01-01 SURGICAL SUPPLY — 10 items
CABLE SURGICAL S-101-97-12 (CABLE) ×2 IMPLANT
CATH RIGHTSITE C315HIS02 (CATHETERS) ×1 IMPLANT
IPG PACE AZUR XT DR MRI W1DR01 (Pacemaker) IMPLANT
LEAD CAPSURE NOVUS 45CM (Lead) ×1 IMPLANT
LEAD CAPSURE NOVUS 5076-52CM (Lead) ×1 IMPLANT
PACE AZURE XT DR MRI W1DR01 (Pacemaker) ×2 IMPLANT
PAD DEFIB LIFELINK (PAD) ×1 IMPLANT
SHEATH CLASSIC 7F (SHEATH) ×2 IMPLANT
TRAY PACEMAKER INSERTION (PACKS) ×1 IMPLANT
WIRE HI TORQ VERSACORE-J 145CM (WIRE) ×1 IMPLANT

## 2018-01-01 NOTE — H&P (Signed)
Brittany CampusDenise W Amundson has presented today for surgery, with the diagnosis of intermittent heart block.  The various methods of treatment have been discussed with the patient and family. After consideration of risks, benefits and other options for treatment, the patient has consented to  Procedure(s): Pacemaker implant as a surgical intervention .  Risks include but not limited to bleeding, tamponade, infection, pneumothorax, among others. The patient's history has been reviewed, patient examined, no change in status, stable for surgery.  I have reviewed the patient's chart and labs.  Questions were answered to the patient's satisfaction.    Branch Pacitti Elberta Fortisamnitz, MD 01/01/2018 11:46 AM

## 2018-01-01 NOTE — Discharge Instructions (Signed)
° ° °  Supplemental Discharge Instructions for  Pacemaker/Defibrillator Patients  Activity No heavy lifting or vigorous activity with your left/right arm for 6 to 8 weeks.  Do not raise your left/right arm above your head for one week.  Gradually raise your affected arm as drawn below.           __         01/06/18                 01/07/18                     01/08/18                           01/09/18  NO DRIVING for  1 week   ; you may begin driving on  1/6/105/3/19   .  WOUND CARE - Keep the wound area clean and dry.  Do not get this area wet for one week. No showers for one week; you may shower on  01/09/18   . - The tape/steri-strips on your wound will fall off; do not pull them off.  No bandage is needed on the site.  DO  NOT apply any creams, oils, or ointments to the wound area. - If you notice any drainage or discharge from the wound, any swelling or bruising at the site, or you develop a fever > 101? F after you are discharged home, call the office at once.  Special Instructions - You are still able to use cellular telephones; use the ear opposite the side where you have your pacemaker/defibrillator.  Avoid carrying your cellular phone near your device. - When traveling through airports, show security personnel your identification card to avoid being screened in the metal detectors.  Ask the security personnel to use the hand wand. - Avoid arc welding equipment, MRI testing (magnetic resonance imaging), TENS units (transcutaneous nerve stimulators).  Call the office for questions about other devices. - Avoid electrical appliances that are in poor condition or are not properly grounded. - Microwave ovens are safe to be near or to operate.

## 2018-01-01 NOTE — Discharge Summary (Addendum)
ELECTROPHYSIOLOGY PROCEDURE DISCHARGE SUMMARY    Patient ID: Brittany [name],  MRN: [id number], DOB/AGE: 15-Mar-1961 57 y.o.  Admit date: 01/01/2018 Discharge date: 01/02/2018  Primary Care Physician: Sherren MochaShaw, [name], MD [facility]rophysiologist: [facility]  Primary Discharge Diagnosis:  Symptomatic bradycardia status post pacemaker implantation this admission  Secondary Discharge Diagnosis:  1.  CVA 2.  Diabetes 3.  HTN 3.  Hyperlipidemia  No Known Allergies   Procedures This Admission:  1.  Implantation of a MDT dual chamber PPM on 01/01/18 by Dr Elberta Fortisamnitz.  The patient received a MDT model number Azure PPM with model number 5076 right atrial lead and 5076 right ventricular lead. There were no immediate post procedure complications. 2.  CXR on 01/02/18 demonstrated no pneumothorax status post device implantation.   Brief HPI: Brittany [name] is a 57 y.o. female was referred to electrophysiology in the outpatient setting for consideration of PPM implantation.  Past medical history includes prior CVA and syncope associated with documented pauses and junctional rhythm.  The patient has had symptomatic bradycardia without reversible causes identified.  Risks, benefits, and alternatives to PPM implantation were reviewed with the patient who wished to proceed.   Hospital Course:  The patient was admitted and underwent implantation of a MDT dual chamber PPM with details as outlined above.  She  was monitored on telemetry overnight which demonstrated SR.  Left chest was without hematoma or ecchymosis.  The device was interrogated and found to be functioning normally.  CXR was obtained and demonstrated no pneumothorax status post device implantation.  Wound care, arm mobility, and restrictions were reviewed with the patient.  The patient was examined and considered stable for discharge to home.    Physical Exam: Vitals:   01/01/18 1549 01/01/18 1935 01/01/18 2000 01/02/18 0327  BP:  (!) 151/67  (!)  166/68  Pulse: (!) 0 86 83 71  Resp: (!) 0 20 20 19   Temp:  98.5 F (36.9 C)  98.2 F (36.8 C)  TempSrc:  Oral  Oral  SpO2: (!) 0% 97% 97% 97%  Weight:    137 lb 9.1 oz (62.4 kg)  Height:        GEN- The patient is well appearing, alert and oriented x 3 today.   HEENT: normocephalic, atraumatic; sclera clear, conjunctiva pink; hearing intact; oropharynx clear; neck supple  Lungs- Clear to ausculation bilaterally, normal work of breathing.  No wheezes, rales, rhonchi Heart- Regular rate and rhythm GI- soft, non-tender, non-distended, bowel sounds present  Extremities- no clubbing, cyanosis, or edema  MS- no significant deformity or atrophy Skin- warm and dry, no rash or lesion, left chest without hematoma/ecchymosis Psych- euthymic mood, full affect Neuro- strength and sensation are intact   Labs:   Lab Results  Component Value Date   WBC 7.0 01/01/2018   HGB 13.4 01/01/2018   HCT 39.9 01/01/2018   MCV 89.7 01/01/2018   PLT 210 01/01/2018    No results for input(s): NA, K, CL, CO2, BUN, CREATININE, CALCIUM, PROT, BILITOT, ALKPHOS, ALT, AST, GLUCOSE in the last 168 hours.  Invalid input(s): LABALBU  Discharge Medications:  Allergies as of 01/02/2018   No Known Allergies     Medication List    TAKE these medications   acetaminophen 500 MG tablet Commonly known as:  TYLENOL Take 1,000 mg by mouth every 6 (six) hours as needed for fever (pain).   albuterol (2.5 MG/3ML) 0.083% nebulizer solution Commonly known as:  PROVENTIL Take 3 mLs (2.5 mg  total) by nebulization every 6 (six) hours as needed for wheezing or shortness of breath.   atorvastatin 80 MG tablet Commonly known as:  LIPITOR Take 1 tablet (80 mg total) by mouth daily at 6 PM.   clopidogrel 75 MG tablet Commonly known as:  PLAVIX Take 1 tablet (75 mg total) by mouth daily.   estradiol 0.1 MG/GM vaginal cream Commonly known as:  ESTRACE Apply to perineum at night x 2 weeks, then every other  night. What changed:    when to take this  additional instructions   Insulin Detemir 100 UNIT/ML Pen Commonly known as:  LEVEMIR FLEXTOUCH Inject 10 Units into the skin 2 (two) times daily.   Insulin Pen Needle 32G X 4 MM Misc Commonly known as:  BD PEN NEEDLE NANO U/F USE WITH Levemir PEN   levETIRAcetam 500 MG tablet Commonly known as:  KEPPRA Take 1 tablet (500 mg total) by mouth 2 (two) times daily.   MULTI-B COMPLEX Caps Take one a day ,B complex  multivitamin over the counter or use prenatal vitamin.   sertraline 100 MG tablet Commonly known as:  ZOLOFT Take 2 tablets (200 mg total) by mouth at bedtime.       Disposition:   Follow-up Information    [facility] Follow up on 01/13/2018.   Specialty:  Cardiology Why:  at [facility] information: [address], [address] [place] [address] [phone]       Regan Lemming, MD Follow up on 04/06/2018.   Specialty:  Cardiology Why:  at 11:30AM Contact information: [address] [place] [address] [phone]           Duration of Discharge Encounter: Greater than 30 minutes including physician time.  Signed, Gypsy Balsam, NP 01/02/2018 8:00 AM  I have seen and examined this patient with Gypsy Balsam.  Agree with above, note added to reflect my findings.  On exam, RRR, no murmurs, lungs clear. Medtronic pacemaker implanted for SSS. LINQ explanted. CXR and interrogation without issues. Plan for discharge with follow up in clinic.    Aleatha Taite M. Shaquoia Miers MD 01/02/2018 8:01 AM

## 2018-01-02 ENCOUNTER — Ambulatory Visit (HOSPITAL_COMMUNITY): Payer: BLUE CROSS/BLUE SHIELD

## 2018-01-02 ENCOUNTER — Encounter (HOSPITAL_COMMUNITY): Payer: Self-pay | Admitting: Cardiology

## 2018-01-02 DIAGNOSIS — E119 Type 2 diabetes mellitus without complications: Secondary | ICD-10-CM | POA: Diagnosis not present

## 2018-01-02 DIAGNOSIS — R001 Bradycardia, unspecified: Secondary | ICD-10-CM | POA: Diagnosis not present

## 2018-01-02 DIAGNOSIS — Z7902 Long term (current) use of antithrombotics/antiplatelets: Secondary | ICD-10-CM | POA: Diagnosis not present

## 2018-01-02 DIAGNOSIS — E785 Hyperlipidemia, unspecified: Secondary | ICD-10-CM | POA: Diagnosis not present

## 2018-01-02 DIAGNOSIS — R911 Solitary pulmonary nodule: Secondary | ICD-10-CM | POA: Diagnosis not present

## 2018-01-02 DIAGNOSIS — I442 Atrioventricular block, complete: Secondary | ICD-10-CM | POA: Diagnosis not present

## 2018-01-02 DIAGNOSIS — Z8673 Personal history of transient ischemic attack (TIA), and cerebral infarction without residual deficits: Secondary | ICD-10-CM | POA: Diagnosis not present

## 2018-01-02 DIAGNOSIS — Z4509 Encounter for adjustment and management of other cardiac device: Secondary | ICD-10-CM | POA: Diagnosis not present

## 2018-01-02 DIAGNOSIS — I10 Essential (primary) hypertension: Secondary | ICD-10-CM | POA: Diagnosis not present

## 2018-01-02 DIAGNOSIS — Z794 Long term (current) use of insulin: Secondary | ICD-10-CM | POA: Diagnosis not present

## 2018-01-02 LAB — GLUCOSE, CAPILLARY: GLUCOSE-CAPILLARY: 147 mg/dL — AB (ref 65–99)

## 2018-01-05 DIAGNOSIS — Z7902 Long term (current) use of antithrombotics/antiplatelets: Secondary | ICD-10-CM | POA: Diagnosis not present

## 2018-01-05 DIAGNOSIS — F329 Major depressive disorder, single episode, unspecified: Secondary | ICD-10-CM | POA: Diagnosis not present

## 2018-01-05 DIAGNOSIS — M797 Fibromyalgia: Secondary | ICD-10-CM | POA: Diagnosis not present

## 2018-01-05 DIAGNOSIS — F17210 Nicotine dependence, cigarettes, uncomplicated: Secondary | ICD-10-CM | POA: Diagnosis not present

## 2018-01-05 DIAGNOSIS — E119 Type 2 diabetes mellitus without complications: Secondary | ICD-10-CM | POA: Diagnosis not present

## 2018-01-05 DIAGNOSIS — K219 Gastro-esophageal reflux disease without esophagitis: Secondary | ICD-10-CM | POA: Diagnosis not present

## 2018-01-05 DIAGNOSIS — I69351 Hemiplegia and hemiparesis following cerebral infarction affecting right dominant side: Secondary | ICD-10-CM | POA: Diagnosis not present

## 2018-01-05 DIAGNOSIS — I10 Essential (primary) hypertension: Secondary | ICD-10-CM | POA: Diagnosis not present

## 2018-01-05 DIAGNOSIS — E785 Hyperlipidemia, unspecified: Secondary | ICD-10-CM | POA: Diagnosis not present

## 2018-01-05 DIAGNOSIS — Z794 Long term (current) use of insulin: Secondary | ICD-10-CM | POA: Diagnosis not present

## 2018-01-05 DIAGNOSIS — I69320 Aphasia following cerebral infarction: Secondary | ICD-10-CM | POA: Diagnosis not present

## 2018-01-05 DIAGNOSIS — I959 Hypotension, unspecified: Secondary | ICD-10-CM | POA: Diagnosis not present

## 2018-01-06 DIAGNOSIS — I69351 Hemiplegia and hemiparesis following cerebral infarction affecting right dominant side: Secondary | ICD-10-CM | POA: Diagnosis not present

## 2018-01-06 DIAGNOSIS — F17210 Nicotine dependence, cigarettes, uncomplicated: Secondary | ICD-10-CM | POA: Diagnosis not present

## 2018-01-06 DIAGNOSIS — E119 Type 2 diabetes mellitus without complications: Secondary | ICD-10-CM | POA: Diagnosis not present

## 2018-01-06 DIAGNOSIS — E785 Hyperlipidemia, unspecified: Secondary | ICD-10-CM | POA: Diagnosis not present

## 2018-01-06 DIAGNOSIS — I69320 Aphasia following cerebral infarction: Secondary | ICD-10-CM | POA: Diagnosis not present

## 2018-01-06 DIAGNOSIS — K219 Gastro-esophageal reflux disease without esophagitis: Secondary | ICD-10-CM | POA: Diagnosis not present

## 2018-01-06 DIAGNOSIS — I959 Hypotension, unspecified: Secondary | ICD-10-CM | POA: Diagnosis not present

## 2018-01-06 DIAGNOSIS — Z7902 Long term (current) use of antithrombotics/antiplatelets: Secondary | ICD-10-CM | POA: Diagnosis not present

## 2018-01-06 DIAGNOSIS — F329 Major depressive disorder, single episode, unspecified: Secondary | ICD-10-CM | POA: Diagnosis not present

## 2018-01-06 DIAGNOSIS — Z794 Long term (current) use of insulin: Secondary | ICD-10-CM | POA: Diagnosis not present

## 2018-01-06 DIAGNOSIS — I10 Essential (primary) hypertension: Secondary | ICD-10-CM | POA: Diagnosis not present

## 2018-01-06 DIAGNOSIS — M797 Fibromyalgia: Secondary | ICD-10-CM | POA: Diagnosis not present

## 2018-01-07 ENCOUNTER — Telehealth: Payer: Self-pay | Admitting: Family Medicine

## 2018-01-07 ENCOUNTER — Telehealth: Payer: Self-pay | Admitting: Cardiology

## 2018-01-07 NOTE — Telephone Encounter (Signed)
Copied from CRM [id number]. Topic: Quick Communication - See Telephone Encounter >> Jan 07, 2018 10:26 AM Jolayne Haines L wrote: CRM for notification. See Telephone encounter for: 01/07/18.  Amy McKee [facility] and needs to extended her therapy for 2 times a week for one more week. She also would like [facility]   Call back is [phone]

## 2018-01-07 NOTE — Telephone Encounter (Signed)
Verbal given 

## 2018-01-07 NOTE — Telephone Encounter (Signed)
Spoke w/ pt son and he informed me that pt had loop recorder and pacemaker placed. He is waiting for a return kit.

## 2018-01-09 ENCOUNTER — Other Ambulatory Visit: Payer: Self-pay | Admitting: Internal Medicine

## 2018-01-09 DIAGNOSIS — I69351 Hemiplegia and hemiparesis following cerebral infarction affecting right dominant side: Secondary | ICD-10-CM | POA: Diagnosis not present

## 2018-01-09 DIAGNOSIS — Z7902 Long term (current) use of antithrombotics/antiplatelets: Secondary | ICD-10-CM | POA: Diagnosis not present

## 2018-01-09 DIAGNOSIS — E119 Type 2 diabetes mellitus without complications: Secondary | ICD-10-CM | POA: Diagnosis not present

## 2018-01-09 DIAGNOSIS — I959 Hypotension, unspecified: Secondary | ICD-10-CM | POA: Diagnosis not present

## 2018-01-09 DIAGNOSIS — M797 Fibromyalgia: Secondary | ICD-10-CM | POA: Diagnosis not present

## 2018-01-09 DIAGNOSIS — I69320 Aphasia following cerebral infarction: Secondary | ICD-10-CM | POA: Diagnosis not present

## 2018-01-09 DIAGNOSIS — Z794 Long term (current) use of insulin: Secondary | ICD-10-CM | POA: Diagnosis not present

## 2018-01-09 DIAGNOSIS — I10 Essential (primary) hypertension: Secondary | ICD-10-CM | POA: Diagnosis not present

## 2018-01-09 DIAGNOSIS — E785 Hyperlipidemia, unspecified: Secondary | ICD-10-CM | POA: Diagnosis not present

## 2018-01-09 DIAGNOSIS — F329 Major depressive disorder, single episode, unspecified: Secondary | ICD-10-CM | POA: Diagnosis not present

## 2018-01-09 DIAGNOSIS — K219 Gastro-esophageal reflux disease without esophagitis: Secondary | ICD-10-CM | POA: Diagnosis not present

## 2018-01-09 DIAGNOSIS — F17210 Nicotine dependence, cigarettes, uncomplicated: Secondary | ICD-10-CM | POA: Diagnosis not present

## 2018-01-12 ENCOUNTER — Telehealth: Payer: Self-pay

## 2018-01-12 ENCOUNTER — Ambulatory Visit: Payer: BLUE CROSS/BLUE SHIELD | Admitting: Adult Health

## 2018-01-12 DIAGNOSIS — K219 Gastro-esophageal reflux disease without esophagitis: Secondary | ICD-10-CM | POA: Diagnosis not present

## 2018-01-12 DIAGNOSIS — I69351 Hemiplegia and hemiparesis following cerebral infarction affecting right dominant side: Secondary | ICD-10-CM | POA: Diagnosis not present

## 2018-01-12 DIAGNOSIS — I639 Cerebral infarction, unspecified: Secondary | ICD-10-CM

## 2018-01-12 DIAGNOSIS — Z794 Long term (current) use of insulin: Secondary | ICD-10-CM | POA: Diagnosis not present

## 2018-01-12 DIAGNOSIS — I959 Hypotension, unspecified: Secondary | ICD-10-CM | POA: Diagnosis not present

## 2018-01-12 DIAGNOSIS — I6602 Occlusion and stenosis of left middle cerebral artery: Secondary | ICD-10-CM

## 2018-01-12 DIAGNOSIS — I10 Essential (primary) hypertension: Secondary | ICD-10-CM | POA: Diagnosis not present

## 2018-01-12 DIAGNOSIS — M797 Fibromyalgia: Secondary | ICD-10-CM | POA: Diagnosis not present

## 2018-01-12 DIAGNOSIS — E785 Hyperlipidemia, unspecified: Secondary | ICD-10-CM | POA: Diagnosis not present

## 2018-01-12 DIAGNOSIS — I69320 Aphasia following cerebral infarction: Secondary | ICD-10-CM | POA: Diagnosis not present

## 2018-01-12 DIAGNOSIS — R4701 Aphasia: Secondary | ICD-10-CM

## 2018-01-12 DIAGNOSIS — F329 Major depressive disorder, single episode, unspecified: Secondary | ICD-10-CM | POA: Diagnosis not present

## 2018-01-12 DIAGNOSIS — G3184 Mild cognitive impairment, so stated: Secondary | ICD-10-CM

## 2018-01-12 DIAGNOSIS — Z7902 Long term (current) use of antithrombotics/antiplatelets: Secondary | ICD-10-CM | POA: Diagnosis not present

## 2018-01-12 DIAGNOSIS — E119 Type 2 diabetes mellitus without complications: Secondary | ICD-10-CM | POA: Diagnosis not present

## 2018-01-12 DIAGNOSIS — F17210 Nicotine dependence, cigarettes, uncomplicated: Secondary | ICD-10-CM | POA: Diagnosis not present

## 2018-01-12 DIAGNOSIS — I63412 Cerebral infarction due to embolism of left middle cerebral artery: Secondary | ICD-10-CM

## 2018-01-12 NOTE — Telephone Encounter (Signed)
Amy with Home health speech therapy is wanting to transition pt to outpatient speech therapy and is requesting an order for outpatient speech therapy through [facility] Please advise.   Copied from CRM [id number]. Topic: Referral - Request >> Jan 12, 2018  2:08 PM Eston Mould B wrote: Reason for CRM:   CRM for notification. See Telephone encounter for: 01/07/18.  Amy McKee speech therapist [facility]. She also would like to [facility].   Call back is [phone]

## 2018-01-12 NOTE — Telephone Encounter (Signed)
Sounds great. Thanks

## 2018-01-12 NOTE — Telephone Encounter (Signed)
RN receive FMLA forms. Per form patient has not paid fee of 50.00 dollars. Form given to Stanton Kidney in medical records for processing. Pt has appt tomorrow with Dr. Pearlean Brownie. Patient was seen by Dr. Pearlean Brownie for a stroke last month. Rn spoke with Dr. Pearlean Brownie, and he will do FM:A form based on patients hospitalization dates for the stroke.

## 2018-01-13 ENCOUNTER — Encounter: Payer: Self-pay | Admitting: Neurology

## 2018-01-13 ENCOUNTER — Ambulatory Visit (INDEPENDENT_AMBULATORY_CARE_PROVIDER_SITE_OTHER): Payer: BLUE CROSS/BLUE SHIELD | Admitting: Neurology

## 2018-01-13 ENCOUNTER — Ambulatory Visit (INDEPENDENT_AMBULATORY_CARE_PROVIDER_SITE_OTHER): Payer: BLUE CROSS/BLUE SHIELD | Admitting: *Deleted

## 2018-01-13 VITALS — BP 135/80 | HR 78 | Ht 63.0 in | Wt 138.6 lb

## 2018-01-13 DIAGNOSIS — Z950 Presence of cardiac pacemaker: Secondary | ICD-10-CM | POA: Diagnosis not present

## 2018-01-13 DIAGNOSIS — I442 Atrioventricular block, complete: Secondary | ICD-10-CM

## 2018-01-13 DIAGNOSIS — R4701 Aphasia: Secondary | ICD-10-CM

## 2018-01-13 DIAGNOSIS — Z0289 Encounter for other administrative examinations: Secondary | ICD-10-CM

## 2018-01-13 LAB — CUP PACEART INCLINIC DEVICE CHECK
Brady Statistic AP VS Percent: 0.71 %
Brady Statistic AS VP Percent: 0.38 %
Brady Statistic RA Percent Paced: 1.92 %
Brady Statistic RV Percent Paced: 1.59 %
Implantable Lead Implant Date: 20190425
Implantable Lead Implant Date: 20190425
Implantable Lead Location: 753860
Implantable Lead Model: 5076
Lead Channel Impedance Value: 399 Ohm
Lead Channel Impedance Value: 399 Ohm
Lead Channel Pacing Threshold Pulse Width: 0.4 ms
Lead Channel Pacing Threshold Pulse Width: 0.4 ms
Lead Channel Sensing Intrinsic Amplitude: 24.375 mV
Lead Channel Setting Pacing Amplitude: 3.5 V
Lead Channel Setting Pacing Amplitude: 3.5 V
Lead Channel Setting Sensing Sensitivity: 2 mV
MDC IDC LEAD LOCATION: 753859
MDC IDC MSMT BATTERY REMAINING LONGEVITY: 183 mo
MDC IDC MSMT BATTERY VOLTAGE: 3.22 V
MDC IDC MSMT LEADCHNL RA IMPEDANCE VALUE: 323 Ohm
MDC IDC MSMT LEADCHNL RA PACING THRESHOLD AMPLITUDE: 1 V
MDC IDC MSMT LEADCHNL RA SENSING INTR AMPL: 0.75 mV
MDC IDC MSMT LEADCHNL RV IMPEDANCE VALUE: 494 Ohm
MDC IDC MSMT LEADCHNL RV PACING THRESHOLD AMPLITUDE: 0.75 V
MDC IDC PG IMPLANT DT: 20190425
MDC IDC SESS DTM: 20190507174649
MDC IDC SET LEADCHNL RV PACING PULSEWIDTH: 0.4 ms
MDC IDC STAT BRADY AP VP PERCENT: 1.21 %
MDC IDC STAT BRADY AS VS PERCENT: 97.7 %

## 2018-01-13 MED ORDER — CLOPIDOGREL BISULFATE 75 MG PO TABS: 75.0000 mg | ORAL_TABLET | Freq: Every day | ORAL | 11 refills | Status: DC

## 2018-01-13 MED ORDER — ATORVASTATIN CALCIUM 20 MG PO TABS: 20.0000 mg | ORAL_TABLET | Freq: Every day | ORAL | 3 refills | Status: DC

## 2018-01-13 NOTE — Progress Notes (Signed)
Wound check appointment, s/p PPM implant and ILR explant. Steri-strips removed from both sites. Wounds without redness or edema. Incision edges approximated, wounds well healed. Normal device function. RV threshold, sensing, and impedance consistent with implant measurements. RA threshold stable, impedance decreased from 768ohms at implant to 399ohms today, P-waves measure 0.45mV today, down from 4.66mV at implant, but trend largely stable. Device programmed at 3.5V with auto capture programmed on for extra safety margin until 3 month visit. Histogram distribution appropriate for patient and level of activity. No mode switches or high ventricular rates noted. Patient educated about wound care, arm mobility, lifting restrictions, and Carelink monitor. ROV with WC on 04/06/18.

## 2018-01-13 NOTE — Progress Notes (Signed)
[facility] [address] [place]. [place] [address] [phone]       OFFICE FOLLOW-UP NOTE  [name] Date of Birth:  1961/04/17 Medical Record Number:  [id number]   HPI: Brittany [name] is a 57 year old Caucasian lady seen today for first office follow-up visit following hospital admission for stroke in April 2019. She is accompanied by her son-in-law. History is obtained from them and review of electronic medical records. I have personally reviewed imaging films.Brittany [name] an 57 y.o.femalewith a PMH of HTN, HLD, DM, Hx of prior CVA and tobacco abuse who presents to the ED as a code stroke with reports of acute onset Global Aphasia, last seen normal at approximately 09:15 this morning 12/06/2017. CT Head code stroke reveals Acute Left Insula Infarct with suspected embolus in distal Left M2. No contraindications were identified in patients history and IV tPA was administered. IR was notified and patient family was briefed by Dr Laurence Slate. Risks and benefits of IR procedure was explained and family wants to proceed. NIH Stroke Scale: 10. Modified Rankin:Score=1. Patient was administered IV TPA and   She subsequently underwent mechanical thrombectomy of left M2 occlusion with TICI 2b reperfusion.gastritis sig echo was unremarkable. Transesophageal echo showed no Source of embolism. LDL cholesterol 81 mg percent. Hemoglobin A1c was 11.5. She was on aspirin prior to admission which was changed to Plavix. Patient did well and was subsequently discharged home with home physical occupational speech therapy. Patient subsequently had symptomatic bradycardia and was seen by Dr. Elberta Fortis electrophysiologist and underwent pacemaker insertion with removal of the loop recorder on 01/02/18. She has not yet found A. Fib. The patient's Plavix and Lipitor have been inadvertently discontinued following the admission for pacemaker. She states her sugars under good control. She is still on  Keppra which was started when she went back on 12/13/17 to the ER with a witnessed seizure. She's had no further breakthrough seizures. She still has residual aphasia but it is improving. Her home speech therapy is recommending outpatient speech therapy now. Physical and occupational therapy have discharged her. She is still out of work and wants FMLA paperwork filled out.   ROS:   14 system review of systems is positive for  Speech difficulties. Comprehension difficulties. All other systems negative PMH:  Past Medical History:  Diagnosis Date  . Chronic lower back pain   . Depression   . Fibromyalgia   . High cholesterol   . Seizures (HCC) 11/2017   "day after she came home from hospital after having stroke" (01/01/2018)  . Stroke [facility]) 12/06/2017   "speech issues since" (01/01/2018)  . Type II diabetes mellitus (HCC)     Social History:  Social History   Socioeconomic History  . Marital status: Married    Spouse name: [name]  . Number of children: 2  . Years of education: 76  . Highest education level: Not on file  Occupational History  . Not on file  Social Needs  . Financial resource strain: Not on file  . Food insecurity:    Worry: Not on file    Inability: Not on file  . Transportation needs:    Medical: Not on file    Non-medical: Not on file  Tobacco Use  . Smoking status: Former Smoker    Packs/day: 1.00    Years: 38.00    Pack years: 38.00    Types: Cigarettes    Last attempt to quit: 12/06/2017    Years since quitting: 0.1  .  Smokeless tobacco: Never Used  Substance and Sexual Activity  . Alcohol use: Yes    Comment: 01/01/2018 "nothing since stroke 11/2017"  . Drug use: No  . Sexual activity: Not Currently  Lifestyle  . Physical activity:    Days per week: Not on file    Minutes per session: Not on file  . Stress: Not on file  Relationships  . Social connections:    Talks on phone: Not on file    Gets together: Not on file    Attends religious service:  Not on file    Active member of club or organization: Not on file    Attends meetings of clubs or organizations: Not on file    Relationship status: Not on file  . Intimate partner violence:    Fear of current or ex partner: Not on file    Emotionally abused: Not on file    Physically abused: Not on file    Forced sexual activity: Not on file  Other Topics Concern  . Not on file  Social History Narrative   Patient is married ([name]) and lives at home with her family   Patient has two children.   Patient works at [employer].   Patient has a college education.   Patient is right-handed.   Patient drinks 3 cups of coffee M-F.       Medications:   Current Outpatient Medications on File Prior to Visit  Medication Sig Dispense Refill  . acetaminophen (TYLENOL) 500 MG tablet Take 1,000 mg by mouth every 6 (six) hours as needed for fever (pain).    Marland Kitchen albuterol (PROVENTIL) (2.5 MG/3ML) 0.083% nebulizer solution Take 3 mLs (2.5 mg total) by nebulization every 6 (six) hours as needed for wheezing or shortness of breath. 150 mL 1  . B Complex-Biotin-FA (MULTI-B COMPLEX) CAPS Take one a day ,B complex  multivitamin over the counter or use prenatal vitamin. 90 capsule 0  . estradiol (ESTRACE) 0.1 MG/GM vaginal cream Apply to perineum at night x 2 weeks, then every other night. (Patient taking differently: See admin instructions. Filled 07/30/17: Apply to perineum at night x 2 weeks, then every other night.) 42.5 g 3  . Insulin Detemir (LEVEMIR FLEXTOUCH) 100 UNIT/ML Pen Inject 10 Units into the skin 2 (two) times daily. 30 mL 0  . Insulin Pen Needle (BD PEN NEEDLE NANO U/F) 32G X 4 MM MISC USE WITH Levemir PEN 100 each 11  . levETIRAcetam (KEPPRA) 500 MG tablet TAKE 1 TABLET BY MOUTH TWICE A DAY 180 tablet 0  . sertraline (ZOLOFT) 100 MG tablet Take 2 tablets (200 mg total) by mouth at bedtime. 180 tablet 3   Current Facility-Administered Medications on File Prior to Visit  Medication  Dose Route Frequency Provider Last Rate Last Dose  . gadopentetate dimeglumine (MAGNEVIST) injection 15 mL  15 mL Intravenous Once PRN Dohmeier, Porfirio Mylar, MD        Allergies:  No Known Allergies  Physical Exam General:frail middle-aged lady seated, in no evident distress Head: head normocephalic and atraumatic.  Neck: supple with no carotid or supraclavicular bruits Cardiovascular: regular rate and rhythm, no murmurs Musculoskeletal: no deformity Skin:  no rash/petichiae Vascular:  Normal pulses all extremities Vitals:   01/13/18 1046  BP: 135/80  Pulse: 78   Neurologic Exam Mental Status: Awake and fully alert. Oriented to place and time. Recent and remote memory intact. Attention span, concentration and fund of knowledge appropriate. Mood and affect appropriate. Moderate expressive aphasia  with word finding difficulties. Slightly impaired comprehension. No dysarthria Cranial Nerves: Fundoscopic exam reveals sharp disc margins. Pupils equal, briskly reactive to light. Extraocular movements full without nystagmus. Visual fields full to confrontation. Hearing intact. Facial sensation intact. Mild right nasolabial fold asymmetry., tongue, palate moves normally and symmetrically.  Motor: Normal bulk and tone. Normal strength in all tested extremity muscles. Sensory.: intact to touch ,pinprick .position and vibratory sensation.  Coordination: Rapid alternating movements normal in all extremities. Finger-to-nose and heel-to-shin performed accurately bilaterally. Gait and Station: Arises from chair without difficulty. Stance is normal. Gait demonstrates normal stride length and balance . Able to heel, toe and tandem walk without difficulty.  Reflexes: 1+ and symmetric. Toes downgoing.   NIHSS  3 Modified Rankin 3   ASSESSMENT: 60-8 year old lady with aphasia secondary to left MCA infarct in April 2019 of cryptogenic etiology treated with IV TPA followed by mechanical thrombectomy with good  recanalization. She also had symptomatic seizure after discharge and still has residual aphasia    PLAN: I had a long d/w patient and her son in law about her recent cryptogenic stroke, aphasia,risk for recurrent stroke/TIAs, personally independently reviewed imaging studies and stroke evaluation results and answered questions.Continue Plavixfor secondary stroke prevention and maintain strict control of hypertension with blood pressure goal below 130/90, diabetes with hemoglobin A1c goal below 6.5% and lipids with LDL cholesterol goal below 70 mg/dL. I also advised the patient to eat a healthy diet with plenty of whole grains, cereals, fruits and vegetables, exercise regularly and maintain ideal body weight.she was given a prescription for Plavix and Lipitor.She was advised to continue outpatient speech therapy.continue Keppra was symptomatic seizures. She was advised not to drive for at least 6 months since her seizure. She still significantly disabled to return to work. Followup in the future with my nose practitioner Shanda Bumps in 3 months or call earlier if necessary Greater than 50% of time during this 25 minute visit was spent on counseling,explanation of diagnosis, planning of further management, discussion with patient and family and coordination of care  Note: This document was prepared with digital dictation and possible smart phrase technology. Any transcriptional errors that result from this process are unintentional

## 2018-01-13 NOTE — Patient Instructions (Signed)
I had a long d/w patient and her son in law about her recent cryptogenic stroke, aphasia,risk for recurrent stroke/TIAs, personally independently reviewed imaging studies and stroke evaluation results and answered questions.Continue Plavixfor secondary stroke prevention and maintain strict control of hypertension with blood pressure goal below 130/90, diabetes with hemoglobin A1c goal below 6.5% and lipids with LDL cholesterol goal below 70 mg/dL. I also advised the patient to eat a healthy diet with plenty of whole grains, cereals, fruits and vegetables, exercise regularly and maintain ideal body weight.Brittany [name] was given a prescription for Plavix and Lipitor.Brittany [name] was advised to continue outpatient speech therapy. Brittany [name] still significantly disabled to return to work. Followup in the future with my nose practitioner Shanda Bumps in 3 months or call earlier if necessary   Stroke Prevention Some medical conditions and behaviors are associated with a higher chance of having a stroke. You can help prevent a stroke by making nutrition, lifestyle, and other changes, including managing any medical conditions you may have. What nutrition changes can be made?  Eat healthy foods. You can do this by: ? Choosing foods high in fiber, such as fresh fruits and vegetables and whole grains. ? Eating at least 5 or more servings of fruits and vegetables a day. Try to fill half of your plate at each meal with fruits and vegetables. ? Choosing lean protein foods, such as lean cuts of meat, poultry without skin, fish, tofu, beans, and nuts. ? Eating low-fat dairy products. ? Avoiding foods that are high in salt (sodium). This can help lower blood pressure. ? Avoiding foods that have saturated fat, trans fat, and cholesterol. This can help prevent high cholesterol. ? Avoiding processed and premade foods.  Follow your health care provider's specific guidelines for losing weight, controlling high blood pressure (hypertension), lowering  high cholesterol, and managing diabetes. These may include: ? Reducing your daily calorie intake. ? Limiting your daily sodium intake to 1,500 milligrams (mg). ? Using only healthy fats for cooking, such as olive oil, canola oil, or sunflower oil. ? Counting your daily carbohydrate intake. What lifestyle changes can be made?  Maintain a healthy weight. Talk to your health care provider about your ideal weight.  Get at least 30 minutes of moderate physical activity at least 5 days a week. Moderate activity includes brisk walking, biking, and swimming.  Do not use any products that contain nicotine or tobacco, such as cigarettes and e-cigarettes. If you need help quitting, ask your health care provider. It may also be helpful to avoid exposure to secondhand smoke.  Limit alcohol intake to no more than 1 drink a day for nonpregnant women and 2 drinks a day for men. One drink equals 12 oz of beer, 5 oz of wine, or 1 oz of hard liquor.  Stop any illegal drug use.  Avoid taking birth control pills. Talk to your health care provider about the risks of taking birth control pills if: ? You are over 69 years old. ? You smoke. ? You get migraines. ? You have ever had a blood clot. What other changes can be made?  Manage your cholesterol levels. ? Eating a healthy diet is important for preventing high cholesterol. If cholesterol cannot be managed through diet alone, you may also need to take medicines. ? Take any prescribed medicines to control your cholesterol as told by your health care provider.  Manage your diabetes. ? Eating a healthy diet and exercising regularly are important parts of managing your blood sugar. If your blood  sugar cannot be managed through diet and exercise, you may need to take medicines. ? Take any prescribed medicines to control your diabetes as told by your health care provider.  Control your hypertension. ? To reduce your risk of stroke, try to keep your blood  pressure below 130/80. ? Eating a healthy diet and exercising regularly are an important part of controlling your blood pressure. If your blood pressure cannot be managed through diet and exercise, you may need to take medicines. ? Take any prescribed medicines to control hypertension as told by your health care provider. ? Ask your health care provider if you should monitor your blood pressure at home. ? Have your blood pressure checked every year, even if your blood pressure is normal. Blood pressure increases with age and some medical conditions.  Get evaluated for sleep disorders (sleep apnea). Talk to your health care provider about getting a sleep evaluation if you snore a lot or have excessive sleepiness.  Take over-the-counter and prescription medicines only as told by your health care provider. Aspirin or blood thinners (antiplatelets or anticoagulants) may be recommended to reduce your risk of forming blood clots that can lead to stroke.  Make sure that any other medical conditions you have, such as atrial fibrillation or atherosclerosis, are managed. What are the warning signs of a stroke? The warning signs of a stroke can be easily remembered as BEFAST.  B is for balance. Signs include: ? Dizziness. ? Loss of balance or coordination. ? Sudden trouble walking.  E is for eyes. Signs include: ? A sudden change in vision. ? Trouble seeing.  F is for face. Signs include: ? Sudden weakness or numbness of the face. ? The face or eyelid drooping to one side.  A is for arms. Signs include: ? Sudden weakness or numbness of the arm, usually on one side of the body.  S is for speech. Signs include: ? Trouble speaking (aphasia). ? Trouble understanding.  T is for time. ? These symptoms may represent a serious problem that is an emergency. Do not wait to see if the symptoms will go away. Get medical help right away. Call your local emergency services (911 in the [place] Do not drive  yourself to the hospital.  Other signs of stroke may include: ? A sudden, severe headache with no known cause. ? Nausea or vomiting. ? Seizure.  Where to find more information: For more information, visit:  American Stroke Association: www.strokeassociation.org  National Stroke Association: www.stroke.org  Summary  You can prevent a stroke by eating healthy, exercising, not smoking, limiting alcohol intake, and managing any medical conditions you may have.  Do not use any products that contain nicotine or tobacco, such as cigarettes and e-cigarettes. If you need help quitting, ask your health care provider. It may also be helpful to avoid exposure to secondhand smoke.  Remember BEFAST for warning signs of stroke. Get help right away if you or a loved one has any of these signs. This information is not intended to replace advice given to you by your health care provider. Make sure you discuss any questions you have with your health care provider. Document Released: 10/03/2004 Document Revised: 10/01/2016 Document Reviewed: 10/01/2016 Elsevier Interactive Patient Education  [employer].

## 2018-01-15 DIAGNOSIS — I10 Essential (primary) hypertension: Secondary | ICD-10-CM | POA: Diagnosis not present

## 2018-01-15 DIAGNOSIS — M797 Fibromyalgia: Secondary | ICD-10-CM | POA: Diagnosis not present

## 2018-01-15 DIAGNOSIS — K219 Gastro-esophageal reflux disease without esophagitis: Secondary | ICD-10-CM | POA: Diagnosis not present

## 2018-01-15 DIAGNOSIS — I959 Hypotension, unspecified: Secondary | ICD-10-CM | POA: Diagnosis not present

## 2018-01-15 DIAGNOSIS — E785 Hyperlipidemia, unspecified: Secondary | ICD-10-CM | POA: Diagnosis not present

## 2018-01-15 DIAGNOSIS — F329 Major depressive disorder, single episode, unspecified: Secondary | ICD-10-CM | POA: Diagnosis not present

## 2018-01-15 DIAGNOSIS — E119 Type 2 diabetes mellitus without complications: Secondary | ICD-10-CM | POA: Diagnosis not present

## 2018-01-15 DIAGNOSIS — I69320 Aphasia following cerebral infarction: Secondary | ICD-10-CM | POA: Diagnosis not present

## 2018-01-15 DIAGNOSIS — I69351 Hemiplegia and hemiparesis following cerebral infarction affecting right dominant side: Secondary | ICD-10-CM | POA: Diagnosis not present

## 2018-01-15 DIAGNOSIS — Z7902 Long term (current) use of antithrombotics/antiplatelets: Secondary | ICD-10-CM | POA: Diagnosis not present

## 2018-01-15 DIAGNOSIS — F17210 Nicotine dependence, cigarettes, uncomplicated: Secondary | ICD-10-CM | POA: Diagnosis not present

## 2018-01-15 DIAGNOSIS — Z794 Long term (current) use of insulin: Secondary | ICD-10-CM | POA: Diagnosis not present

## 2018-01-15 NOTE — Telephone Encounter (Signed)
New written order for outpatient ST due to stroke, need to sent to [facility] outpatient Neuro rehab center, fax # [phone]

## 2018-01-15 NOTE — Telephone Encounter (Signed)
Dr. Clelia Croft I'm not sure how to write this order. Please advise.

## 2018-01-21 NOTE — Telephone Encounter (Signed)
Order placed

## 2018-01-21 NOTE — Telephone Encounter (Signed)
FMLA forms done by Dr. Pearlean Brownie. PT paid fee on 01/13/2018. Office notes fax, and FMLA form fax to .Jomarie Longs at [phone]. Forms were fax twice and confirmed x2. Sent to medical records.

## 2018-01-21 NOTE — Addendum Note (Signed)
Addended by: [name] on: 01/21/2018 02:53 PM   Modules accepted: Orders

## 2018-01-23 ENCOUNTER — Ambulatory Visit (INDEPENDENT_AMBULATORY_CARE_PROVIDER_SITE_OTHER): Payer: BLUE CROSS/BLUE SHIELD | Admitting: Family Medicine

## 2018-01-23 ENCOUNTER — Ambulatory Visit (INDEPENDENT_AMBULATORY_CARE_PROVIDER_SITE_OTHER): Payer: Self-pay | Admitting: Vascular Surgery

## 2018-01-23 ENCOUNTER — Other Ambulatory Visit: Payer: Self-pay

## 2018-01-23 ENCOUNTER — Ambulatory Visit (INDEPENDENT_AMBULATORY_CARE_PROVIDER_SITE_OTHER)
Admission: RE | Admit: 2018-01-23 | Discharge: 2018-01-23 | Disposition: A | Discharge status: Home / Self Care | Payer: BLUE CROSS/BLUE SHIELD | Source: Ambulatory Visit | Attending: Vascular Surgery | Admitting: Vascular Surgery

## 2018-01-23 ENCOUNTER — Encounter: Payer: Self-pay | Admitting: Family Medicine

## 2018-01-23 ENCOUNTER — Encounter: Payer: Self-pay | Admitting: Vascular Surgery

## 2018-01-23 ENCOUNTER — Ambulatory Visit (HOSPITAL_COMMUNITY)
Admission: RE | Admit: 2018-01-23 | Discharge: 2018-01-23 | Disposition: A | Discharge status: Home / Self Care | Payer: BLUE CROSS/BLUE SHIELD | Source: Ambulatory Visit | Attending: Vascular Surgery | Admitting: Vascular Surgery

## 2018-01-23 VITALS — BP 148/78 | HR 67 | Temp 98.7°F | Resp 16 | Ht 63.0 in | Wt 140.0 lb

## 2018-01-23 VITALS — BP 148/70 | HR 85 | Temp 98.5°F | Resp 18 | Ht 63.0 in | Wt 137.0 lb

## 2018-01-23 DIAGNOSIS — E1149 Type 2 diabetes mellitus with other diabetic neurological complication: Secondary | ICD-10-CM

## 2018-01-23 DIAGNOSIS — I10 Essential (primary) hypertension: Secondary | ICD-10-CM | POA: Diagnosis not present

## 2018-01-23 DIAGNOSIS — Z8673 Personal history of transient ischemic attack (TIA), and cerebral infarction without residual deficits: Secondary | ICD-10-CM | POA: Insufficient documentation

## 2018-01-23 DIAGNOSIS — Z87891 Personal history of nicotine dependence: Secondary | ICD-10-CM | POA: Diagnosis not present

## 2018-01-23 DIAGNOSIS — I739 Peripheral vascular disease, unspecified: Secondary | ICD-10-CM

## 2018-01-23 DIAGNOSIS — Z48812 Encounter for surgical aftercare following surgery on the circulatory system: Secondary | ICD-10-CM

## 2018-01-23 DIAGNOSIS — I639 Cerebral infarction, unspecified: Secondary | ICD-10-CM | POA: Diagnosis not present

## 2018-01-23 DIAGNOSIS — G8191 Hemiplegia, unspecified affecting right dominant side: Secondary | ICD-10-CM | POA: Diagnosis not present

## 2018-01-23 DIAGNOSIS — I63412 Cerebral infarction due to embolism of left middle cerebral artery: Secondary | ICD-10-CM

## 2018-01-23 DIAGNOSIS — R4701 Aphasia: Secondary | ICD-10-CM

## 2018-01-23 MED ORDER — INSULIN PEN NEEDLE 32G X 4 MM MISC: 11 refills | Status: DC

## 2018-01-23 MED ORDER — INSULIN ASPART 100 UNIT/ML ~~LOC~~ SOLN: SUBCUTANEOUS | 3 refills | Status: DC

## 2018-01-23 MED ORDER — INSULIN DETEMIR 100 UNIT/ML FLEXPEN: 30.0000 [IU] | PEN_INJECTOR | Freq: Every day | SUBCUTANEOUS | 0 refills | Status: DC

## 2018-01-23 MED ORDER — IRBESARTAN 150 MG PO TABS: 150.0000 mg | ORAL_TABLET | Freq: Every day | ORAL | 0 refills | Status: DC

## 2018-01-23 NOTE — Progress Notes (Signed)
By signing my name below, I, Brittany [name], attest that this documentation has been prepared under the direction and in the presence of Brittany [name] July, MD. Electronically Signed: Thelma [name], Medical Scribe 01/23/2018 at 3:55 PM.  Subjective:    Patient ID: Brittany [name], female    DOB: 10/29/1960, 57 y.o.   MRN: [id number] Chief Complaint  Patient presents with  . Diabetes    Pt brings doc sugars. Sugars have been running from the 180s to high 200s  . Hypertension  . Follow-up    HPI Brittany [name] is a 57 y.o. female who presents to Primary Care at [facility] for FUP of DM2 and s/p hospitalization last month.   CVA She was hospitalized for MCA and embolic stroke in 12-13-2017. Saw her in FUP 1 month prior and she had been started on insulin for DM2 during her hospitalization. She was still having aphasia though was starting to show improvement. She was discharged on levemir  BID which we increased to 10 units BID at her last visit.   3 weeks ago she had a pacer placed. At that time her blood sugars were 160-180 and 200-270 at night.   DM2 Last A1c was 11.5 2 month prior on 12-07-17. A1c has historically been under poor control, where on 05-17-17 it was >14.0 Her BS have been high at home. She saw Dr. Pearlean Brownie, neurology, 10 days ago.   She checks fasting and 2 hour post prandial.  Fasting range: 140s-150s, up to 200s, 190s, 180s, 150s. Per her husband, she has been eating candy recently due to the Easter holiday.    She takes 10 units of levemir BID.  She denies symptoms of lows.   Joints  She complains of L joint pain, burning, and swelling. They get red and worsen sometimes. This has been worse s/p her stroke. She also complains of R-sided leg pain and burning, as well as in R foot, but not in her L leg. She is R hand dominant. Her pain is worse at night. She denies numbness/tingling. She prefers to start a medication at this time.    Speech therapy She had a home health care  nurse, Brittany [name], who would come 2x a week but they aren't going to come out to her house anymore. She has not heard back from her speech therapist. She has a referral to neurology in July 2019.  She is done with the physical therapy but still needs speech therapy. She is not going back to work.   Smoking She has not been smoking since her stroke. Past Medical History:  Diagnosis Date  . Chronic lower back pain   . Depression   . Fibromyalgia   . High cholesterol   . Seizures (HCC) 11/2017   "day after she came home from hospital after having stroke" (01/01/2018)  . Stroke [facility]) 12/06/2017   "speech issues since" (01/01/2018)  . Type II diabetes mellitus (HCC)    Past Surgical History:  Procedure Laterality Date  . BACK SURGERY    . CHOLECYSTECTOMY    . INSERT / REPLACE / REMOVE PACEMAKER  01/01/2018  . IR CT HEAD LTD  12/06/2017  . IR PERCUTANEOUS ART THROMBECTOMY/INFUSION INTRACRANIAL INC DIAG ANGIO  12/06/2017  . LOOP RECORDER INSERTION N/A 12/12/2017   Procedure: LOOP RECORDER INSERTION;  Surgeon: Regan Lemming, MD;  Location: [facility] INVASIVE CV LAB;  Service: Cardiovascular;  Laterality: N/A;  . LOOP RECORDER REMOVAL  01/01/2018  . LOOP RECORDER REMOVAL N/A 01/01/2018  Procedure: LOOP RECORDER REMOVAL;  Surgeon: Regan Lemming, MD;  Location: [facility] INVASIVE CV LAB;  Service: Cardiovascular;  Laterality: N/A;  . PACEMAKER IMPLANT N/A 01/01/2018   Procedure: PACEMAKER IMPLANT;  Surgeon: Regan Lemming, MD;  Location: [facility] INVASIVE CV LAB;  Service: Cardiovascular;  Laterality: N/A;  . RADIOLOGY WITH ANESTHESIA N/A 12/06/2017   Procedure: CODE STROKE;  Surgeon: Julieanne Cotton, MD;  Location: [facility] OR;  Service: Radiology;  Laterality: N/A;  . TEE WITHOUT CARDIOVERSION N/A 12/10/2017   Procedure: TRANSESOPHAGEAL ECHOCARDIOGRAM (TEE);  Surgeon: Jake Bathe, MD;  Location: [facility] ENDOSCOPY;  Service: Cardiovascular;  Laterality: N/A;  . THROMBECTOMY FEMORAL ARTERY Right 12/07/2017    Procedure: THROMBECTOMY RIGHT FEMORAL ARTERY;  Surgeon: Maeola Harman, MD;  Location: [facility] OR;  Service: Vascular;  Laterality: Right;  . TONSILLECTOMY     Current Outpatient Medications on File Prior to Visit  Medication Sig Dispense Refill  . acetaminophen (TYLENOL) 500 MG tablet Take 1,000 mg by mouth every 6 (six) hours as needed for fever (pain).    Marland Kitchen albuterol (PROVENTIL) (2.5 MG/3ML) 0.083% nebulizer solution Take 3 mLs (2.5 mg total) by nebulization every 6 (six) hours as needed for wheezing or shortness of breath. 150 mL 1  . atorvastatin (LIPITOR) 20 MG tablet Take 1 tablet (20 mg total) by mouth daily. 90 tablet 3  . B Complex-Biotin-FA (MULTI-B COMPLEX) CAPS Take one a day ,B complex  multivitamin over the counter or use prenatal vitamin. 90 capsule 0  . clopidogrel (PLAVIX) 75 MG tablet Take 1 tablet (75 mg total) by mouth daily. 30 tablet 11  . levETIRAcetam (KEPPRA) 500 MG tablet TAKE 1 TABLET BY MOUTH TWICE A DAY 180 tablet 0  . sertraline (ZOLOFT) 100 MG tablet Take 2 tablets (200 mg total) by mouth at bedtime. 180 tablet 3   Current Facility-Administered Medications on File Prior to Visit  Medication Dose Route Frequency Provider Last Rate Last Dose  . gadopentetate dimeglumine (MAGNEVIST) injection 15 mL  15 mL Intravenous Once PRN Dohmeier, Porfirio Mylar, MD       No Known Allergies Family History  Problem Relation Age of Onset  . Cancer Mother   . Diabetes Mother   . Heart disease Mother   . Parkinsonism Mother   . Diabetes Father   . Heart disease Father   . Dementia Father   . Cancer Maternal Grandmother 62       lung  . COPD Maternal Aunt    Social History   Socioeconomic History  . Marital status: Married    Spouse name: Brittany [name]  . Number of children: 2  . Years of education: 8  . Highest education level: Not on file  Occupational History  . Not on file  Social Needs  . Financial resource strain: Not on file  . Food insecurity:    Worry: Not on  file    Inability: Not on file  . Transportation needs:    Medical: Not on file    Non-medical: Not on file  Tobacco Use  . Smoking status: Former Smoker    Packs/day: 1.00    Years: 38.00    Pack years: 38.00    Types: Cigarettes    Last attempt to quit: 12/06/2017    Years since quitting: 0.1  . Smokeless tobacco: Never Used  Substance and Sexual Activity  . Alcohol use: Yes    Comment: 01/01/2018 "nothing since stroke 11/2017"  . Drug use: No  . Sexual activity: Not Currently  Lifestyle  . Physical activity:    Days per week: Not on file    Minutes per session: Not on file  . Stress: Not on file  Relationships  . Social connections:    Talks on phone: Not on file    Gets together: Not on file    Attends religious service: Not on file    Active member of club or organization: Not on file    Attends meetings of clubs or organizations: Not on file    Relationship status: Not on file  Other Topics Concern  . Not on file  Social History Narrative   Patient is married ([name]) and lives at home with her family   Patient has two children.   Patient works at [employer].   Patient has a college education.   Patient is right-handed.   Patient drinks 3 cups of coffee M-F.      Depression screen [facility] 2/9 01/23/2018 12/25/2017 12/19/2017 07/30/2017 05/17/2017  Decreased Interest 0 0 0 0 0  Down, Depressed, Hopeless 0 0 0 0 0  PHQ - 2 Score 0 0 0 0 0  Altered sleeping - - - - -  Tired, decreased energy - - - - -  Change in appetite - - - - -  Feeling bad or failure about yourself  - - - - -  Trouble concentrating - - - - -  Moving slowly or fidgety/restless - - - - -  Suicidal thoughts - - - - -  PHQ-9 Score - - - - -  Difficult doing work/chores - - - - -    Depression screen [facility] 2/9 01/23/2018 12/25/2017 12/19/2017 07/30/2017 05/17/2017  Decreased Interest 0 0 0 0 0  Down, Depressed, Hopeless 0 0 0 0 0  PHQ - 2 Score 0 0 0 0 0  Altered sleeping - - - - -  Tired,  decreased energy - - - - -  Change in appetite - - - - -  Feeling bad or failure about yourself  - - - - -  Trouble concentrating - - - - -  Moving slowly or fidgety/restless - - - - -  Suicidal thoughts - - - - -  PHQ-9 Score - - - - -  Difficult doing work/chores - - - - -     Review of Systems  Respiratory: Negative for shortness of breath.   Cardiovascular: Negative for chest pain.  Gastrointestinal: Negative for abdominal pain.  Musculoskeletal: Positive for arthralgias, joint swelling and myalgias.  Neurological: Positive for speech difficulty. Negative for numbness.       Objective:   Physical Exam  Constitutional: She appears well-developed and well-nourished. No distress.  HENT:  Head: Normocephalic and atraumatic.  Right Ear: External ear normal.  Left Ear: External ear normal.  Eyes: Conjunctivae are normal. No scleral icterus.  Neck: Normal range of motion. Neck supple. No thyromegaly present.  Cardiovascular: Normal rate, regular rhythm, normal heart sounds and intact distal pulses.  Pulmonary/Chest: Effort normal and breath sounds normal. No stridor. No respiratory distress. She has no wheezes. She has no rales.  Musculoskeletal: She exhibits no edema.  Lymphadenopathy:    She has no cervical adenopathy.  Neurological: She is alert. She is not disoriented.  Fluid sentences, paragraphs, appropriate conversational tones and "back-and-forth" of conversation nml. Appears to understand every one else but 50-75% of sentences are word-salad gibberish - she does not seem to be fully aware of this though seems to  then remember that she has been told that she doesn't make any sense as will respond to one of my questions with a lengthy word-salad and then when I can't respond she will look to her husband to give an answer sometimes.  Skin: Skin is warm and dry. She is not diaphoretic. No erythema.  Psychiatric: She has a normal mood and affect. Her behavior is normal. She is  not actively hallucinating. She is attentive.    Vitals:   01/23/18 1459  BP: (!) 148/70  Pulse: 85  Resp: 18  Temp: 98.5 F (36.9 C)  TempSrc: Oral  SpO2: 97%  Weight: 137 lb (62.1 kg)  Height:  (1.6 m)       Assessment & Plan:   1. 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 cerebrovascular accident (CVA) (HCC) - was improving with home health SLP but now that has stopped and still waiting on getting outpt SLP set up so is starting to backslide and loose the initial ground made.  3. Type 2 diabetes mellitus with other neurologic complication, without long-term current use of insulin (HCC) - change levemir from 10 bid to 30 qd. Start prn novolog for pre-meal highs - pt is new to insulin so starting out slow as far as frequency and quantity - will def need to be much more aggressive to get good control as she becomes familiar with the 4x/d checking cbgs and injecting insulin she will likely need.  4. Acute right hemiparesis (HCC) - sig improving  5. History of tobacco use disorder - stopped after CVA 01/01/18 - doing great w/ not being tempted to restart  6. Essential hypertension, benign -  Was hypotensive during hosp so all bp meds stopped but now bp appears to be creeping back up again - restart acei - will try irbesartan since hasn't been recalled yet    Meds ordered this encounter  Medications  . Insulin Detemir (LEVEMIR FLEXTOUCH) 100 UNIT/ML Pen    Sig: Inject 30 Units into the skin daily at 10 pm.    Dispense:  30 mL    Refill:  0    Want to change to pens instead of vial with next insulin fill. Sig change  . Insulin Pen Needle (BD PEN NEEDLE NANO U/F) 32G X 4 MM MISC    Sig: USE WITH Levemir PEN    Dispense:  100 each    Refill:  11  . insulin aspart (NOVOLOG) 100 UNIT/ML injection    Sig: Inject 5u if CBG >250    Dispense:  3 vial    Refill:  3  . irbesartan (AVAPRO) 150 MG tablet     Sig: Take 1 tablet (150 mg total) by mouth daily.    Dispense:  30 tablet    Refill:  0    I personally performed the services described in this documentation, which was scribed in my presence. The recorded information has been reviewed and considered, and addended by me as needed.   Norberto Sorenson, M.D.  Primary Care at [facility] [address] [place], [place] [address] [phone] phone [phone] fax  02/10/18 2:11 PM

## 2018-01-23 NOTE — Patient Instructions (Addendum)
IF you received an x-ray today, you will receive an invoice from [facility]. Please contact [facility] at [phone] with questions or concerns regarding your invoice.   IF you received labwork today, you will receive an invoice from [facility]. Please contact [facility] at [phone] with questions or concerns regarding your invoice.   Our billing staff will not be able to assist you with questions regarding bills from these companies.  You will be contacted with the lab results as soon as they are available. The fastest way to get your results is to activate your My Chart account. Instructions are located on the last page of this paperwork. If you have not heard from [place] regarding the results in 2 weeks, please contact this office.     Correction Insulin Correction insulin, also called corrective insulin or a supplemental dose, is a small amount of insulin that can be used to lower your blood sugar (glucose) if it is too high. You may be instructed to check your blood glucose at certain times of the day and to use correction insulin as needed to lower your blood glucose to your target range. Correction insulin is primarily used as part of diabetes management. It may also be prescribed for people who do not have diabetes. What is a correction scale? A correction scale, also called a sliding scale, is prescribed by your health care provider to help you determine when you need correction insulin. Your correction scale is based on your individual treatment goals, and it has two parts:  Ranges of blood glucose levels.  How much correction insulin to give yourself if your blood sugar falls within a certain range.  If your blood glucose is in your desired range, you will not need correction insulin and you should take your normal insulin dose. What type of insulin do I need? Your health care provider may prescribe rapid-acting or short-acting insulin for you to use as  correction insulin. Rapid-acting insulin:  Starts working quickly, in as little as 5 minutes.  Can last for 3-6 hours.  Works well when taken right before a meal to quickly lower blood glucose. Short-acting insulin:  Starts working in about 30 minutes.  Can last for 6-8 hours.  Should be taken about 30 minutes before you start eating a meal. Talk with your health care provider or pharmacist about which type of correction insulin to take and when to take it. If you use insulin to control your diabetes, you should use correction insulin in addition to the longer-acting (basal) insulin that you normally use. How do I manage my blood glucose with correction insulin? Giving a correction dose  Check your blood glucose as directed by your health care provider.  Use your correction scale to find the range that your blood glucose is in.  Identify the units of insulin that match your blood glucose range.  Make sure you have food available that you can eat in the next 15-30 minutes, after your correction dose.  Give yourself the dose of correction insulin that your health care provider has prescribed in your correction scale. Always make sure you are using the right type of insulin. ? If your correction insulin is rapid-acting, start eating a meal within 15 minutes after your correction dose to keep your blood glucose from getting too low. ? If your correction insulin is short-acting, start eating a meal within 30 minutes after your correction dose to keep your blood glucose from getting too low. Keeping a blood  glucose log  Write down your blood glucose test results and the amount of insulin that you give yourself. Do this every time you check blood glucose or take insulin. Bring this log with you to your medical visits. This information will help your health care provider to manage your medicines.  Note anything that may affect your blood glucose, such as: ? Changes in normal exercise or  activity. ? Changes in your normal schedule, such as changes in your sleep routine, going on vacation, changing your diet, or holidays. ? New over-the-counter or prescription medicines. ? Illness, stress, or anxiety. ? Changes in the time that you took your medicine or insulin. ? Changes in your meals, such as skipping a meal, having a late meal, or dining out. ? Eating things that may affect blood glucose, such as snacks, meal portions that are larger than normal, drinks that contain sugar, or eating less than usual. What do I need to know about hyperglycemia and hypoglycemia? What is hyperglycemia? Hyperglycemia, also called high blood glucose, occurs when blood glucose is too high. Make sure you know the early signs of hyperglycemia, such as:  Increased thirst.  Hunger.  Feeling very tired.  Needing to urinate more often than usual.  Blurry vision.  What is hypoglycemia? Hypoglycemia is also called low blood glucose. Be aware of "stacking" your insulin doses. This happens when you correct a high blood glucose by giving yourself extra insulin too soon after a previous correction dose or mealtime dose. This may cause you to have too much insulin in your body and may put you at risk for hypoglycemia. Hypoglycemia occurs with a blood glucose level at or below 70 mg/dL (3.9 mmol/L). It is important to know the symptoms of hypoglycemia and treat it right away. Always have a 15-gram rapid-acting carbohydrate snack with you to treat low blood glucose. Family members and close friends should also know the symptoms and should understand how to treat hypoglycemia, in case you are not able to treat yourself. What are the symptoms of hypoglycemia? Hypoglycemia symptoms can include:  Hunger.  Anxiety.  Sweating and feeling clammy.  Confusion.  Dizziness or light-headedness.  Sleepiness.  Nausea.  Increased heart rate.  Headache.  Blurry vision.  Jerky movements that you cannot  control (seizure).  Nightmares.  Tingling or numbness around the mouth, lips, or tongue.  A change in speech.  Decreased ability to concentrate.  A change in coordination.  Restless sleep.  Tremors or shakes.  Fainting.  Irritability.  How do I treat hypoglycemia? If you are alert and able to swallow safely, follow the 15:15 rule:  Take 15 grams of a rapid-acting carbohydrate. Rapid-acting options include: ? 1 tube of glucose gel. ? 3 glucose pills. ? 6-8 pieces of hard candy. ? 4 oz (120 mL) of fruit juice. ? 4 oz (120 mL) of regular (not diet) soda.  Check your blood glucose 15 minutes after you take the carbohydrate. ? If the repeat blood glucose level is still at or below 70 mg/dL (3.9 mmol/L), take 15 grams of a carbohydrate again. ? If your blood glucose level does not increase above 70 mg/dL (3.9 mmol/L) after 3 tries, seek emergency medical care.  After your blood glucose level returns to normal, eat a meal or a snack within 1 hour.  How do I treat severe hypoglycemia? Severe hypoglycemia is when your blood glucose level is at or below 54 mg/dL (3 mmol/L). Severe hypoglycemia is an emergency. Do not wait to  see if the symptoms will go away. Get medical help right away. Call your local emergency services (911 in the [place] Do not drive yourself to the hospital. If you have severe hypoglycemia and you cannot eat or drink, you may need an injection of glucagon. A family member or close friend should learn how to check your blood glucose and how to give you a glucagon injection. Ask your health care provider if you need to have an emergency glucagon injection kit available. Severe hypoglycemia may need to be treated in a hospital. The treatment may include getting glucose through an IV tube. You may also need treatment for the cause of your hypoglycemia. Why do I need correction insulin if I do not have diabetes? If you do not have diabetes, your health care provider may  prescribe insulin because:  Keeping your blood glucose in the target range is important for your overall health.  You are taking medicines that cause your blood glucose to be higher than normal.  Contact a health care provider if:  You develop low blood glucose that you are not able to treat yourself.  You have high blood glucose that you are not able to correct with correction insulin.  Your blood glucose is often too low.  You used emergency glucagon to treat low blood glucose. Get help right away if:  You become unresponsive. If this happens, someone else should call emergency services (911 in the [place] right away.  Your blood glucose is lower than 54 mg/dL (3.0 mmol/L).  You become confused or you have trouble thinking clearly.  You have difficulty breathing. Summary  Correction insulin is a small amount of insulin that can be used to lower your blood sugar (glucose) if it is too high.  Talk with your health care provider or pharmacist about which type of correction insulin to take and when to take it. If you use insulin to control your diabetes, you should use correction insulin in addition to the longer-acting (basal) insulin that you normally use.  You may be instructed to check your blood glucose at certain times of the day and to use correction insulin as needed to lower your blood glucose to your target range. Always keep a log of your blood glucose values and the amount of insulin that you used.  It is important to know the symptoms of hypoglycemia and treat it right away. Always have a 15-gram rapid-acting carbohydrate snack with you to treat low blood glucose. Family members and close friends should also know the symptoms and should understand how to treat hypoglycemia, in case you are not able to treat yourself. This information is not intended to replace advice given to you by your health care provider. Make sure you discuss any questions you have with your health care  provider. Document Released: 01/17/2011 Document Revised: 05/24/2016 Document Reviewed: 05/24/2016 Elsevier Interactive Patient Education  2018 [employer].  Blood Glucose Monitoring, Adult Monitoring your blood sugar (glucose) helps you manage your diabetes. It also helps you and your health care provider determine how well your diabetes management plan is working. Blood glucose monitoring involves checking your blood glucose as often as directed, and keeping a record (log) of your results over time. Why should I monitor my blood glucose? Checking your blood glucose regularly can:  Help you understand how food, exercise, illnesses, and medicines affect your blood glucose.  Let you know what your blood glucose is at any time. You can quickly tell if you are  having low blood glucose (hypoglycemia) or high blood glucose (hyperglycemia).  Help you and your health care provider adjust your medicines as needed.  When should I check my blood glucose? Follow instructions from your health care provider about how often to check your blood glucose. This may depend on:  The type of diabetes you have.  How well-controlled your diabetes is.  Medicines you are taking.  If you have type 1 diabetes:  Check your blood glucose at least 2 times a day.  Also check your blood glucose: ? Before every insulin injection. ? Before and after exercise. ? Between meals. ? 2 hours after a meal. ? Occasionally between 2:00 a.m. and 3:00 a.m., as directed. ? Before potentially dangerous tasks, like driving or using heavy machinery. ? At bedtime.  You may need to check your blood glucose more often, up to 6-10 times a day: ? If you use an insulin pump. ? If you need multiple daily injections (MDI). ? If your diabetes is not well-controlled. ? If you are ill. ? If you have a history of severe hypoglycemia. ? If you have a history of not knowing when your blood glucose is getting low (hypoglycemia  unawareness). If you have type 2 diabetes:  If you take insulin or other diabetes medicines, check your blood glucose at least 2 times a day.  If you are on intensive insulin therapy, check your blood glucose at least 4 times a day. Occasionally, you may also need to check between 2:00 a.m. and 3:00 a.m., as directed.  Also check your blood glucose: ? Before and after exercise. ? Before potentially dangerous tasks, like driving or using heavy machinery.  You may need to check your blood glucose more often if: ? Your medicine is being adjusted. ? Your diabetes is not well-controlled. ? You are ill. What is a blood glucose log?  A blood glucose log is a record of your blood glucose readings. It helps you and your health care provider: ? Look for patterns in your blood glucose over time. ? Adjust your diabetes management plan as needed.  Every time you check your blood glucose, write down your result and notes about things that may be affecting your blood glucose, such as your diet and exercise for the day.  Most glucose meters store a record of glucose readings in the meter. Some meters allow you to download your records to a computer. How do I check my blood glucose? Follow these steps to get accurate readings of your blood glucose: Supplies needed   Blood glucose meter.  Test strips for your meter. Each meter has its own strips. You must use the strips that come with your meter.  A needle to prick your finger (lancet). Do not use lancets more than once.  A device that holds the lancet (lancing device).  A journal or log book to write down your results. Procedure  Wash your hands with soap and water.  Prick the side of your finger (not the tip) with the lancet. Use a different finger each time.  Gently rub the finger until a small drop of blood appears.  Follow instructions that come with your meter for inserting the test strip, applying blood to the strip, and using your  blood glucose meter.  Write down your result and any notes. Alternative testing sites  Some meters allow you to use areas of your body other than your finger (alternative sites) to test your blood.  If you think you  may have hypoglycemia, or if you have hypoglycemia unawareness, do not use alternative sites. Use your finger instead.  Alternative sites may not be as accurate as the fingers, because blood flow is slower in these areas. This means that the result you get may be delayed, and it may be different from the result that you would get from your finger.  The most common alternative sites are: ? Forearm. ? Thigh. ? Palm of the hand. Additional tips  Always keep your supplies with you.  If you have questions or need help, all blood glucose meters have a 24-hour "hotline" number that you can call. You may also contact your health care provider.  After you use a few boxes of test strips, adjust (calibrate) your blood glucose meter by following instructions that came with your meter. This information is not intended to replace advice given to you by your health care provider. Make sure you discuss any questions you have with your health care provider. Document Released: 08/29/2003 Document Revised: 03/15/2016 Document Reviewed: 02/05/2016 Elsevier Interactive Patient Education  2017 [employer].

## 2018-01-23 NOTE — Progress Notes (Signed)
  Subjective:     Patient ID: Brittany [name], female   DOB: 1961-07-05, 57 y.o.   MRN: [id number]  HPI 57 year old female with a history of left MCA distribution stroke underwent a right lower extremity thromboembolectomy and repair of profunda femoris arteriotomy and femoral vein venotomy by me.  She is now doing well.  Her only issue is limitation with speech and she is in therapy for this.  She is walking without limitation.  She does not have any leg pain.   Review of Systems Speech disturbance    Objective:   Physical Exam Awake and alert Nonlabored respirations Well-healed right groin incision Palpable dorsalis pedis pulse  I have interpreted her lower extremity duplex which has triphasic waveforms in both the anterior tibial and posterior tibial arteries.  ABIs are 1.24 right 1.20 left    Assessment/plan     57 year old female follows up from recent right lower extremity embolectomy and repair of a profunda femoris arteriotomy.  She is doing very well without lower extremity symptoms.  She can follow-up on a as needed basis.  Brittany [name] C. Randie Heinz, MD [facility] Office: [phone] Pager: [phone]

## 2018-02-03 ENCOUNTER — Telehealth: Payer: Self-pay | Admitting: Family Medicine

## 2018-02-03 DIAGNOSIS — Z0289 Encounter for other administrative examinations: Secondary | ICD-10-CM

## 2018-02-03 NOTE — Telephone Encounter (Signed)
Patient's life insurance company sent in forms to be completed by Dr Clelia Croft. I was not sure how to complete them so I have left them all blank. I will place the forms in Dr Alver Fisher box on 5/28 if you can complete them please return to the FMLA/Disability box at the 102 checkout desk within 5-7 business days. If you need the patient to come in for an OV please let me know and I will give her a call.  Thank you

## 2018-02-05 ENCOUNTER — Telehealth: Payer: Self-pay

## 2018-02-05 NOTE — Telephone Encounter (Signed)
Rn fax forms to [employer] long term disability  Claims at [phone]. Patient paid the fee of 50.00 dollars. Form fax and receive.

## 2018-02-09 ENCOUNTER — Telehealth: Payer: Self-pay | Admitting: Family Medicine

## 2018-02-09 NOTE — Telephone Encounter (Signed)
Copied from CRM [id number]. Topic: Quick Communication - See Telephone Encounter >> Feb 09, 2018 10:18 AM Raquel SarnaHayes, Teresa G wrote: Pt is needing speech therapy to come back to home.  She is loosing what she has learned and her appt is out to July. Needing a referral for therapy to return asap.

## 2018-02-09 NOTE — Telephone Encounter (Signed)
Please advise.. Facility was requested. Do you have any other suggestions? Thank you

## 2018-02-09 NOTE — Telephone Encounter (Signed)
Can you please check on referral? 

## 2018-02-10 NOTE — Telephone Encounter (Signed)
Have we completed these forms? Today is the 5th business day °

## 2018-02-10 NOTE — Telephone Encounter (Signed)
Looks like [facility] has speech therapy at:  [facility]) - [phone] [facility]) [phone]- [phone] [facility] ([facility] [phone]- [phone]  Can you call each location and sched her their earliest appt - then we can cancel what we don't want? Starting with whatever is nearest her house?  Even if it far away, maybe they could transfer to the closer location next mo when appt is avail.  Pt had severe CVA and has severe aphasia - fluent word salad like receptive Wernicke's type - she was beginning to make progress w/ home PT where about 1/2 of what she said made sense but now sounds like she is backsliding but no longer qualifies for home health as this is her main limitation - maybe if you let them know the acuity of her condition, they can work her in.  If not, I'd [facility].

## 2018-02-13 ENCOUNTER — Telehealth: Payer: Self-pay | Admitting: Family Medicine

## 2018-02-13 DIAGNOSIS — G8929 Other chronic pain: Secondary | ICD-10-CM | POA: Diagnosis not present

## 2018-02-13 DIAGNOSIS — I10 Essential (primary) hypertension: Secondary | ICD-10-CM | POA: Diagnosis not present

## 2018-02-13 DIAGNOSIS — E1149 Type 2 diabetes mellitus with other diabetic neurological complication: Secondary | ICD-10-CM | POA: Diagnosis not present

## 2018-02-13 DIAGNOSIS — Z794 Long term (current) use of insulin: Secondary | ICD-10-CM | POA: Diagnosis not present

## 2018-02-13 DIAGNOSIS — M545 Low back pain: Secondary | ICD-10-CM | POA: Diagnosis not present

## 2018-02-13 DIAGNOSIS — Z79891 Long term (current) use of opiate analgesic: Secondary | ICD-10-CM | POA: Diagnosis not present

## 2018-02-13 DIAGNOSIS — F329 Major depressive disorder, single episode, unspecified: Secondary | ICD-10-CM | POA: Diagnosis not present

## 2018-02-13 DIAGNOSIS — M797 Fibromyalgia: Secondary | ICD-10-CM | POA: Diagnosis not present

## 2018-02-13 DIAGNOSIS — Z7902 Long term (current) use of antithrombotics/antiplatelets: Secondary | ICD-10-CM | POA: Diagnosis not present

## 2018-02-13 DIAGNOSIS — I69351 Hemiplegia and hemiparesis following cerebral infarction affecting right dominant side: Secondary | ICD-10-CM | POA: Diagnosis not present

## 2018-02-13 DIAGNOSIS — Z7982 Long term (current) use of aspirin: Secondary | ICD-10-CM | POA: Diagnosis not present

## 2018-02-13 DIAGNOSIS — E785 Hyperlipidemia, unspecified: Secondary | ICD-10-CM | POA: Diagnosis not present

## 2018-02-13 DIAGNOSIS — I69320 Aphasia following cerebral infarction: Secondary | ICD-10-CM | POA: Diagnosis not present

## 2018-02-13 NOTE — Telephone Encounter (Signed)
Verbal left on VM 

## 2018-02-13 NOTE — Telephone Encounter (Unsigned)
Copied from CRM [id number]. Topic: General - Other >> Feb 13, 2018 11:13 AM Percival SpanishKennedy, Cheryl W wrote:  Amy with [facility] call to ask for verbal orders for home health speech therapy 2 times a week for 3 weeks  [phone]

## 2018-02-16 NOTE — Telephone Encounter (Signed)
Today is the 9th business day have we completed these forms for the patient?

## 2018-02-17 DIAGNOSIS — I69351 Hemiplegia and hemiparesis following cerebral infarction affecting right dominant side: Secondary | ICD-10-CM | POA: Diagnosis not present

## 2018-02-17 DIAGNOSIS — G8929 Other chronic pain: Secondary | ICD-10-CM | POA: Diagnosis not present

## 2018-02-17 DIAGNOSIS — M797 Fibromyalgia: Secondary | ICD-10-CM | POA: Diagnosis not present

## 2018-02-17 DIAGNOSIS — E785 Hyperlipidemia, unspecified: Secondary | ICD-10-CM | POA: Diagnosis not present

## 2018-02-17 DIAGNOSIS — I69320 Aphasia following cerebral infarction: Secondary | ICD-10-CM | POA: Diagnosis not present

## 2018-02-17 DIAGNOSIS — Z79891 Long term (current) use of opiate analgesic: Secondary | ICD-10-CM | POA: Diagnosis not present

## 2018-02-17 DIAGNOSIS — Z794 Long term (current) use of insulin: Secondary | ICD-10-CM | POA: Diagnosis not present

## 2018-02-17 DIAGNOSIS — M545 Low back pain: Secondary | ICD-10-CM | POA: Diagnosis not present

## 2018-02-17 DIAGNOSIS — Z7982 Long term (current) use of aspirin: Secondary | ICD-10-CM | POA: Diagnosis not present

## 2018-02-17 DIAGNOSIS — Z7902 Long term (current) use of antithrombotics/antiplatelets: Secondary | ICD-10-CM | POA: Diagnosis not present

## 2018-02-17 DIAGNOSIS — F329 Major depressive disorder, single episode, unspecified: Secondary | ICD-10-CM | POA: Diagnosis not present

## 2018-02-17 DIAGNOSIS — E1149 Type 2 diabetes mellitus with other diabetic neurological complication: Secondary | ICD-10-CM | POA: Diagnosis not present

## 2018-02-17 DIAGNOSIS — I10 Essential (primary) hypertension: Secondary | ICD-10-CM | POA: Diagnosis not present

## 2018-02-17 NOTE — Telephone Encounter (Signed)
There were 4 different disability forms all from one ins co - 6 pages w/ no repeat questions - so you can let pt know that at least you were able to persuade me to complete them (to the best of my limited ability. Marland Kitchen. . .) in less than the 20 business days I was hoping to have for all this.   Returned fmla/disability box 6/11 a.m.

## 2018-02-19 DIAGNOSIS — Z79891 Long term (current) use of opiate analgesic: Secondary | ICD-10-CM | POA: Diagnosis not present

## 2018-02-19 DIAGNOSIS — F329 Major depressive disorder, single episode, unspecified: Secondary | ICD-10-CM | POA: Diagnosis not present

## 2018-02-19 DIAGNOSIS — I10 Essential (primary) hypertension: Secondary | ICD-10-CM | POA: Diagnosis not present

## 2018-02-19 DIAGNOSIS — E1149 Type 2 diabetes mellitus with other diabetic neurological complication: Secondary | ICD-10-CM | POA: Diagnosis not present

## 2018-02-19 DIAGNOSIS — E785 Hyperlipidemia, unspecified: Secondary | ICD-10-CM | POA: Diagnosis not present

## 2018-02-19 DIAGNOSIS — I69320 Aphasia following cerebral infarction: Secondary | ICD-10-CM | POA: Diagnosis not present

## 2018-02-19 DIAGNOSIS — M797 Fibromyalgia: Secondary | ICD-10-CM | POA: Diagnosis not present

## 2018-02-19 DIAGNOSIS — Z794 Long term (current) use of insulin: Secondary | ICD-10-CM | POA: Diagnosis not present

## 2018-02-19 DIAGNOSIS — I69351 Hemiplegia and hemiparesis following cerebral infarction affecting right dominant side: Secondary | ICD-10-CM | POA: Diagnosis not present

## 2018-02-19 DIAGNOSIS — Z7982 Long term (current) use of aspirin: Secondary | ICD-10-CM | POA: Diagnosis not present

## 2018-02-19 DIAGNOSIS — G8929 Other chronic pain: Secondary | ICD-10-CM | POA: Diagnosis not present

## 2018-02-19 DIAGNOSIS — M545 Low back pain: Secondary | ICD-10-CM | POA: Diagnosis not present

## 2018-02-19 DIAGNOSIS — Z7902 Long term (current) use of antithrombotics/antiplatelets: Secondary | ICD-10-CM | POA: Diagnosis not present

## 2018-02-24 DIAGNOSIS — E1149 Type 2 diabetes mellitus with other diabetic neurological complication: Secondary | ICD-10-CM | POA: Diagnosis not present

## 2018-02-24 DIAGNOSIS — M545 Low back pain: Secondary | ICD-10-CM | POA: Diagnosis not present

## 2018-02-24 DIAGNOSIS — I69351 Hemiplegia and hemiparesis following cerebral infarction affecting right dominant side: Secondary | ICD-10-CM | POA: Diagnosis not present

## 2018-02-24 DIAGNOSIS — Z79891 Long term (current) use of opiate analgesic: Secondary | ICD-10-CM | POA: Diagnosis not present

## 2018-02-24 DIAGNOSIS — G8929 Other chronic pain: Secondary | ICD-10-CM | POA: Diagnosis not present

## 2018-02-24 DIAGNOSIS — I10 Essential (primary) hypertension: Secondary | ICD-10-CM | POA: Diagnosis not present

## 2018-02-24 DIAGNOSIS — Z794 Long term (current) use of insulin: Secondary | ICD-10-CM | POA: Diagnosis not present

## 2018-02-24 DIAGNOSIS — Z7902 Long term (current) use of antithrombotics/antiplatelets: Secondary | ICD-10-CM | POA: Diagnosis not present

## 2018-02-24 DIAGNOSIS — M797 Fibromyalgia: Secondary | ICD-10-CM | POA: Diagnosis not present

## 2018-02-24 DIAGNOSIS — F329 Major depressive disorder, single episode, unspecified: Secondary | ICD-10-CM | POA: Diagnosis not present

## 2018-02-24 DIAGNOSIS — Z7982 Long term (current) use of aspirin: Secondary | ICD-10-CM | POA: Diagnosis not present

## 2018-02-24 DIAGNOSIS — E785 Hyperlipidemia, unspecified: Secondary | ICD-10-CM | POA: Diagnosis not present

## 2018-02-24 DIAGNOSIS — I69320 Aphasia following cerebral infarction: Secondary | ICD-10-CM | POA: Diagnosis not present

## 2018-02-26 ENCOUNTER — Encounter: Payer: Self-pay | Admitting: Family Medicine

## 2018-02-26 ENCOUNTER — Ambulatory Visit (INDEPENDENT_AMBULATORY_CARE_PROVIDER_SITE_OTHER): Payer: BLUE CROSS/BLUE SHIELD | Admitting: Family Medicine

## 2018-02-26 ENCOUNTER — Other Ambulatory Visit: Payer: Self-pay

## 2018-02-26 VITALS — BP 122/72 | HR 83 | Temp 99.2°F | Resp 16 | Ht 63.0 in | Wt 147.0 lb

## 2018-02-26 DIAGNOSIS — E1165 Type 2 diabetes mellitus with hyperglycemia: Secondary | ICD-10-CM

## 2018-02-26 DIAGNOSIS — I10 Essential (primary) hypertension: Secondary | ICD-10-CM | POA: Diagnosis not present

## 2018-02-26 DIAGNOSIS — I6930 Unspecified sequelae of cerebral infarction: Secondary | ICD-10-CM

## 2018-02-26 DIAGNOSIS — F331 Major depressive disorder, recurrent, moderate: Secondary | ICD-10-CM

## 2018-02-26 DIAGNOSIS — Z794 Long term (current) use of insulin: Secondary | ICD-10-CM

## 2018-02-26 DIAGNOSIS — I63412 Cerebral infarction due to embolism of left middle cerebral artery: Secondary | ICD-10-CM

## 2018-02-26 DIAGNOSIS — E1149 Type 2 diabetes mellitus with other diabetic neurological complication: Secondary | ICD-10-CM | POA: Diagnosis not present

## 2018-02-26 DIAGNOSIS — IMO0002 Reserved for concepts with insufficient information to code with codable children: Secondary | ICD-10-CM

## 2018-02-26 DIAGNOSIS — R4701 Aphasia: Secondary | ICD-10-CM

## 2018-02-26 DIAGNOSIS — I639 Cerebral infarction, unspecified: Secondary | ICD-10-CM

## 2018-02-26 LAB — POCT URINALYSIS DIP (MANUAL ENTRY)
BILIRUBIN UA: NEGATIVE mg/dL
Bilirubin, UA: NEGATIVE
Glucose, UA: 250 mg/dL — AB
Leukocytes, UA: NEGATIVE
Nitrite, UA: NEGATIVE
PROTEIN UA: NEGATIVE mg/dL
RBC UA: NEGATIVE
SPEC GRAV UA: 1.025 (ref 1.010–1.025)
UROBILINOGEN UA: 0.2 U/dL
pH, UA: 5.5 (ref 5.0–8.0)

## 2018-02-26 LAB — COMPREHENSIVE METABOLIC PANEL
A/G RATIO: 1.6 (ref 1.2–2.2)
ALBUMIN: 4.2 g/dL (ref 3.5–5.5)
ALK PHOS: 131 IU/L — AB (ref 39–117)
ALT: 22 IU/L (ref 0–32)
AST: 21 IU/L (ref 0–40)
BILIRUBIN TOTAL: 0.4 mg/dL (ref 0.0–1.2)
BUN / CREAT RATIO: 22 (ref 9–23)
BUN: 12 mg/dL (ref 6–24)
CHLORIDE: 102 mmol/L (ref 96–106)
CO2: 21 mmol/L (ref 20–29)
Calcium: 9.5 mg/dL (ref 8.7–10.2)
Creatinine, Ser: 0.55 mg/dL — ABNORMAL LOW (ref 0.57–1.00)
GFR calc Af Amer: 120 mL/min/{1.73_m2} (ref >59)
GFR calc non Af Amer: 104 mL/min/{1.73_m2} (ref >59)
Globulin, Total: 2.7 g/dL (ref 1.5–4.5)
Glucose: 233 mg/dL — ABNORMAL HIGH (ref 65–99)
POTASSIUM: 4.4 mmol/L (ref 3.5–5.2)
Sodium: 136 mmol/L (ref 134–144)
Total Protein: 6.9 g/dL (ref 6.0–8.5)

## 2018-02-26 LAB — POCT GLYCOSYLATED HEMOGLOBIN (HGB A1C): HEMOGLOBIN A1C: 10.2 % — AB (ref 4.0–5.6)

## 2018-02-26 LAB — GLUCOSE, POCT (MANUAL RESULT ENTRY): POC Glucose: 230 mg/dl — AB (ref 70–99)

## 2018-02-26 MED ORDER — INSULIN ASPART 100 UNIT/ML FLEXPEN: PEN_INJECTOR | SUBCUTANEOUS | 11 refills | Status: DC

## 2018-02-26 MED ORDER — IRBESARTAN 150 MG PO TABS: 150.0000 mg | ORAL_TABLET | Freq: Every day | ORAL | 1 refills | Status: DC

## 2018-02-26 MED ORDER — SERTRALINE HCL 100 MG PO TABS: 200.0000 mg | ORAL_TABLET | Freq: Every day | ORAL | 3 refills | Status: DC

## 2018-02-26 MED ORDER — ARIPIPRAZOLE 2 MG PO TABS: 2.0000 mg | ORAL_TABLET | Freq: Every day | ORAL | 4 refills | Status: DC

## 2018-02-26 MED ORDER — FREESTYLE LIBRE 14 DAY READER DEVI: 1.0000 [IU] | Freq: Four times a day (QID) | 0 refills | Status: DC

## 2018-02-26 MED ORDER — FREESTYLE LIBRE 14 DAY SENSOR MISC: 1.0000 [IU] | 4 refills | Status: DC

## 2018-02-26 MED ORDER — INSULIN DETEMIR 100 UNIT/ML FLEXPEN: 40.0000 [IU] | PEN_INJECTOR | Freq: Every day | SUBCUTANEOUS | 0 refills | Status: DC

## 2018-02-26 NOTE — Progress Notes (Addendum)
Subjective:  By signing my name below, I, Brittany [name], attest that this documentation has been prepared under the direction and in the presence of Norberto SorensonEva Warnie Belair, MD Electronically Signed: Charline BillsEssence [name], ED Scribe 02/26/2018 at 11:30 AM.   Patient ID: Brittany [name], female    DOB: Aug 06, 1961, 57 y.o.   MRN: [id number]  Chief Complaint  Patient presents with  . Diabetes    3 weeks follow up  . Hypertension    follow up   HPI Brittany [name] is a 57 y.o. female who presents to [facility] Care at [facility] f/u.  Aphasia Aphasia s/p stroke but she has been in speech therapy and has improved significantly. Denies difficulty swallowing.  DM Husband [name]([name]) has been checking her blood glucose outside of the office. Morning fasting readings in the 200s. Sev wks ago 270-280, last wk dropped to lower 200s, this wk 170-190s. Pt eats dinner around 6 PM but checks evening blood glucose around 10 PM prior to bed. Qhs readings largely in the 300s, but can range high 200s-500s. She has been eating 3 waffles with syrup for breakfast and states she does drink soft drinks often.  Mood [name] states that pt has been more down and depressed lately. He reports that pt just lays in the bed and cries often.  Past Medical History:  Diagnosis Date  . Chronic lower back pain   . Depression   . Fibromyalgia   . High cholesterol   . Seizures (HCC) 11/2017   "day after she came home from hospital after having stroke" (01/01/2018)  . Stroke [facility](HCC) 12/06/2017   "speech issues since" (01/01/2018)  . Type II diabetes mellitus (HCC)    Current Outpatient Medications on File Prior to Visit  Medication Sig Dispense Refill  . acetaminophen (TYLENOL) 500 MG tablet Take 1,000 mg by mouth every 6 (six) hours as needed for fever (pain).    Marland Kitchen. albuterol (PROVENTIL) (2.5 MG/3ML) 0.083% nebulizer solution Take 3 mLs (2.5 mg total) by nebulization every 6 (six) hours as needed for wheezing or shortness of breath. 150 mL 1  .  atorvastatin (LIPITOR) 20 MG tablet Take 1 tablet (20 mg total) by mouth daily. 90 tablet 3  . clopidogrel (PLAVIX) 75 MG tablet Take 1 tablet (75 mg total) by mouth daily. 30 tablet 11  . insulin aspart (NOVOLOG) 100 UNIT/ML injection Inject 5u if CBG >250 3 vial 3  . Insulin Detemir (LEVEMIR FLEXTOUCH) 100 UNIT/ML Pen Inject 30 Units into the skin daily at 10 pm. 30 mL 0  . irbesartan (AVAPRO) 150 MG tablet Take 1 tablet (150 mg total) by mouth daily. 30 tablet 0  . levETIRAcetam (KEPPRA) 500 MG tablet TAKE 1 TABLET BY MOUTH TWICE A DAY 180 tablet 0  . sertraline (ZOLOFT) 100 MG tablet Take 2 tablets (200 mg total) by mouth at bedtime. 180 tablet 3  . B Complex-Biotin-FA (MULTI-B COMPLEX) CAPS Take one a day ,B complex  multivitamin over the counter or use prenatal vitamin. (Patient not taking: Reported on 02/26/2018) 90 capsule 0  . Insulin Pen Needle (BD PEN NEEDLE NANO U/F) 32G X 4 MM MISC USE WITH Levemir PEN 100 each 11   Current Facility-Administered Medications on File Prior to Visit  Medication Dose Route Frequency Provider Last Rate Last Dose  . gadopentetate dimeglumine (MAGNEVIST) injection 15 mL  15 mL Intravenous Once PRN Dohmeier, Porfirio Mylararmen, MD       No Known Allergies   Past Surgical History:  Procedure  Laterality Date  . BACK SURGERY    . CHOLECYSTECTOMY    . INSERT / REPLACE / REMOVE PACEMAKER  01/01/2018  . IR CT HEAD LTD  12/06/2017  . IR PERCUTANEOUS ART THROMBECTOMY/INFUSION INTRACRANIAL INC DIAG ANGIO  12/06/2017  . LOOP RECORDER INSERTION N/A 12/12/2017   Procedure: LOOP RECORDER INSERTION;  Surgeon: Regan Lemming, MD;  Location: [facility] INVASIVE CV LAB;  Service: Cardiovascular;  Laterality: N/A;  . LOOP RECORDER REMOVAL  01/01/2018  . LOOP RECORDER REMOVAL N/A 01/01/2018   Procedure: LOOP RECORDER REMOVAL;  Surgeon: Regan Lemming, MD;  Location: [facility] INVASIVE CV LAB;  Service: Cardiovascular;  Laterality: N/A;  . PACEMAKER IMPLANT N/A 01/01/2018   Procedure:  PACEMAKER IMPLANT;  Surgeon: Regan Lemming, MD;  Location: [facility] INVASIVE CV LAB;  Service: Cardiovascular;  Laterality: N/A;  . RADIOLOGY WITH ANESTHESIA N/A 12/06/2017   Procedure: CODE STROKE;  Surgeon: Julieanne Cotton, MD;  Location: [facility] OR;  Service: Radiology;  Laterality: N/A;  . TEE WITHOUT CARDIOVERSION N/A 12/10/2017   Procedure: TRANSESOPHAGEAL ECHOCARDIOGRAM (TEE);  Surgeon: Jake Bathe, MD;  Location: [facility] ENDOSCOPY;  Service: Cardiovascular;  Laterality: N/A;  . THROMBECTOMY FEMORAL ARTERY Right 12/07/2017   Procedure: THROMBECTOMY RIGHT FEMORAL ARTERY;  Surgeon: Maeola Harman, MD;  Location: [facility] OR;  Service: Vascular;  Laterality: Right;  . TONSILLECTOMY     Family History  Problem Relation Age of Onset  . Cancer Mother   . Diabetes Mother   . Heart disease Mother   . Parkinsonism Mother   . Diabetes Father   . Heart disease Father   . Dementia Father   . Cancer Maternal Grandmother 62       lung  . COPD Maternal Aunt    Social History   Socioeconomic History  . Marital status: Married    Spouse name: [name]  . Number of children: 2  . Years of education: 39  . Highest education level: Not on file  Occupational History  . Not on file  Social Needs  . Financial resource strain: Not on file  . Food insecurity:    Worry: Not on file    Inability: Not on file  . Transportation needs:    Medical: Not on file    Non-medical: Not on file  Tobacco Use  . Smoking status: Former Smoker    Packs/day: 1.00    Years: 38.00    Pack years: 38.00    Types: Cigarettes    Last attempt to quit: 12/06/2017    Years since quitting: 0.5  . Smokeless tobacco: Never Used  Substance and Sexual Activity  . Alcohol use: Yes    Comment: 01/01/2018 "nothing since stroke 11/2017"  . Drug use: No  . Sexual activity: Not Currently  Lifestyle  . Physical activity:    Days per week: Not on file    Minutes per session: Not on file  . Stress: Not on file  Relationships   . Social connections:    Talks on phone: Not on file    Gets together: Not on file    Attends religious service: Not on file    Active member of club or organization: Not on file    Attends meetings of clubs or organizations: Not on file    Relationship status: Not on file  Other Topics Concern  . Not on file  Social History Narrative   Patient is married ([name]) and lives at home with her family   Patient has two  children.   Patient works at [employer].   Patient has a college education.   Patient is right-handed.   Patient drinks 3 cups of coffee M-F.      Depression screen [facility] 2/9 02/26/2018 01/23/2018 12/25/2017 12/19/2017 07/30/2017  Decreased Interest (No Data) 0 0 0 0  Down, Depressed, Hopeless - 0 0 0 0  PHQ - 2 Score - 0 0 0 0  Altered sleeping - - - - -  Tired, decreased energy - - - - -  Change in appetite - - - - -  Feeling bad or failure about yourself  - - - - -  Trouble concentrating - - - - -  Moving slowly or fidgety/restless - - - - -  Suicidal thoughts - - - - -  PHQ-9 Score - - - - -  Difficult doing work/chores - - - - -    Review of Systems  HENT: Negative for trouble swallowing.   Neurological: Positive for speech difficulty (s/p stroke).  Psychiatric/Behavioral: Positive for dysphoric mood.      Objective:   Physical Exam  Constitutional: She is oriented to person, place, and time. She appears well-developed and well-nourished. No distress.  HENT:  Head: Normocephalic and atraumatic.  Eyes: Conjunctivae and EOM are normal.  Neck: Neck supple. No tracheal deviation present.  Cardiovascular: Normal rate and regular rhythm.  Pulmonary/Chest: Effort normal and breath sounds normal. No respiratory distress.  Musculoskeletal: Normal range of motion.  Neurological: She is alert and oriented to person, place, and time.  Skin: Skin is warm and dry.  Psychiatric: She has a normal mood and affect. Her behavior is normal.  Nursing note and vitals  reviewed.  BP 122/72 (BP Location: Left Arm)   Pulse 83   Temp 99.2 F (37.3 C) (Oral)   Resp 16   Ht 5\' 3"  (1.6 m)   Wt 147 lb (66.7 kg)   SpO2 96%   PF 96 L/min   BMI 26.04 kg/m    Results for orders placed or performed in visit on 02/26/18  POCT glycosylated hemoglobin (Hb A1C)  Result Value Ref Range   Hemoglobin A1C 10.2 (A) 4.0 - 5.6 %   HbA1c, POC (prediabetic range)  5.7 - 6.4 %   HbA1c, POC (controlled diabetic range)  0.0 - 7.0 %  POCT glucose (manual entry)  Result Value Ref Range   POC Glucose 230 (A) 70 - 99 mg/dl   Assessment & Plan:   1. Type 2 diabetes mellitus with other neurologic complication, with long-term current use of insulin (HCC)   2. Insulin dependent type 2 diabetes mellitus, uncontrolled (HCC)   3. 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 Essential hypertension, benign   6. Moderate episode of recurrent major depressive disorder (HCC)     Orders Placed This Encounter  Procedures  . Comprehensive metabolic panel  . Ambulatory referral to Endocrinology    Referral Priority:   Routine    Referral Type:   Consultation    Referral Reason:   Specialty Services Required    Number of Visits Requested:   1  . POCT glycosylated hemoglobin (Hb A1C)  . POCT glucose (manual entry)  . POCT urinalysis dipstick    Meds ordered this encounter  Medications  . Continuous Blood Gluc Receiver (FREESTYLE LIBRE 14 DAY READER) DEVI    Sig:  1 Units by Does not apply route 4 (four) times daily.    Dispense:  1 Device    Refill:  0  . Continuous Blood Gluc Sensor (FREESTYLE LIBRE 14 DAY SENSOR) MISC    Sig: Apply 1 Units topically every 14 (fourteen) days.    Dispense:  6 each    Refill:  4  . sertraline (ZOLOFT) 100 MG tablet    Sig: Take 2 tablets (200 mg total) by mouth at bedtime.    Dispense:  180 tablet    Refill:  3  .  ARIPiprazole (ABILIFY) 2 MG tablet    Sig: Take 1 tablet (2 mg total) by mouth daily.    Dispense:  30 tablet    Refill:  4    To augment antidepressent  . irbesartan (AVAPRO) 150 MG tablet    Sig: Take 1 tablet (150 mg total) by mouth daily.    Dispense:  90 tablet    Refill:  1  . Insulin Detemir (LEVEMIR FLEXTOUCH) 100 UNIT/ML Pen    Sig: Inject 40 Units into the skin daily at 10 pm.    Dispense:  45 mL    Refill:  0  . insulin aspart (NOVOLOG FLEXPEN) 100 UNIT/ML FlexPen    Sig: Check cbg prior to each meal.  Inject 5u if CBG 150-199, 10u if CBG 200-299, 15u if CBG >300    Dispense:  15 mL    Refill:  11   I personally performed the services described in this documentation, which was scribed in my presence. The recorded information has been reviewed and considered, and addended by me as needed.   Norberto Sorenson, M.D.  Primary Care at [facility] [address] [place], [place] [address] [phone] phone [phone] fax  06/18/18 5:36 AM

## 2018-02-26 NOTE — Patient Instructions (Addendum)
Check cbg prior to each meal.  Inject NOVOLOG FLEXPEN then for 5u if CBG 150-199, 10u if CBG 200-299, 15u if CBG >300  Take Levemir 40u every day (increase from 30u prior.)  Continue 2 sertraline a day and add in 1 ability tab to help more with mood - you might want to take them right before bed.  IF you received an x-ray today, you will receive an invoice from [facility]. Please contact [facility] at [phone] with questions or concerns regarding your invoice.   IF you received labwork today, you will receive an invoice from [facility]. Please contact [facility] at [phone] with questions or concerns regarding your invoice.   Our billing staff will not be able to assist you with questions regarding bills from these companies.  You will be contacted with the lab results as soon as they are available. The fastest way to get your results is to activate your My Chart account. Instructions are located on the last page of this paperwork. If you have not heard from us regarding the results in 2 weeks, please contact this office.    Diabetic Sliding Scale (I)  IF BLOOD SUGAR IS:   LESS THAN 150 ( NO INSULIN)   150-199 (5 UNITS)   200-299 (10 UNITS)   MORE THAN 301 UNITS (15 UNITS)

## 2018-02-27 DIAGNOSIS — Z7902 Long term (current) use of antithrombotics/antiplatelets: Secondary | ICD-10-CM | POA: Diagnosis not present

## 2018-02-27 DIAGNOSIS — F329 Major depressive disorder, single episode, unspecified: Secondary | ICD-10-CM | POA: Diagnosis not present

## 2018-02-27 DIAGNOSIS — I69320 Aphasia following cerebral infarction: Secondary | ICD-10-CM | POA: Diagnosis not present

## 2018-02-27 DIAGNOSIS — I69351 Hemiplegia and hemiparesis following cerebral infarction affecting right dominant side: Secondary | ICD-10-CM | POA: Diagnosis not present

## 2018-02-27 DIAGNOSIS — Z794 Long term (current) use of insulin: Secondary | ICD-10-CM | POA: Diagnosis not present

## 2018-02-27 DIAGNOSIS — Z7982 Long term (current) use of aspirin: Secondary | ICD-10-CM | POA: Diagnosis not present

## 2018-02-27 DIAGNOSIS — M797 Fibromyalgia: Secondary | ICD-10-CM | POA: Diagnosis not present

## 2018-02-27 DIAGNOSIS — I10 Essential (primary) hypertension: Secondary | ICD-10-CM | POA: Diagnosis not present

## 2018-02-27 DIAGNOSIS — M545 Low back pain: Secondary | ICD-10-CM | POA: Diagnosis not present

## 2018-02-27 DIAGNOSIS — E785 Hyperlipidemia, unspecified: Secondary | ICD-10-CM | POA: Diagnosis not present

## 2018-02-27 DIAGNOSIS — G8929 Other chronic pain: Secondary | ICD-10-CM | POA: Diagnosis not present

## 2018-02-27 DIAGNOSIS — E1149 Type 2 diabetes mellitus with other diabetic neurological complication: Secondary | ICD-10-CM | POA: Diagnosis not present

## 2018-02-27 DIAGNOSIS — Z79891 Long term (current) use of opiate analgesic: Secondary | ICD-10-CM | POA: Diagnosis not present

## 2018-03-02 DIAGNOSIS — Z7982 Long term (current) use of aspirin: Secondary | ICD-10-CM | POA: Diagnosis not present

## 2018-03-02 DIAGNOSIS — I10 Essential (primary) hypertension: Secondary | ICD-10-CM | POA: Diagnosis not present

## 2018-03-02 DIAGNOSIS — E785 Hyperlipidemia, unspecified: Secondary | ICD-10-CM | POA: Diagnosis not present

## 2018-03-02 DIAGNOSIS — Z79891 Long term (current) use of opiate analgesic: Secondary | ICD-10-CM | POA: Diagnosis not present

## 2018-03-02 DIAGNOSIS — I69320 Aphasia following cerebral infarction: Secondary | ICD-10-CM | POA: Diagnosis not present

## 2018-03-02 DIAGNOSIS — E1149 Type 2 diabetes mellitus with other diabetic neurological complication: Secondary | ICD-10-CM | POA: Diagnosis not present

## 2018-03-02 DIAGNOSIS — M545 Low back pain: Secondary | ICD-10-CM | POA: Diagnosis not present

## 2018-03-02 DIAGNOSIS — F329 Major depressive disorder, single episode, unspecified: Secondary | ICD-10-CM | POA: Diagnosis not present

## 2018-03-02 DIAGNOSIS — Z794 Long term (current) use of insulin: Secondary | ICD-10-CM | POA: Diagnosis not present

## 2018-03-02 DIAGNOSIS — G8929 Other chronic pain: Secondary | ICD-10-CM | POA: Diagnosis not present

## 2018-03-02 DIAGNOSIS — M797 Fibromyalgia: Secondary | ICD-10-CM | POA: Diagnosis not present

## 2018-03-02 DIAGNOSIS — I69351 Hemiplegia and hemiparesis following cerebral infarction affecting right dominant side: Secondary | ICD-10-CM | POA: Diagnosis not present

## 2018-03-02 DIAGNOSIS — Z7902 Long term (current) use of antithrombotics/antiplatelets: Secondary | ICD-10-CM | POA: Diagnosis not present

## 2018-03-05 ENCOUNTER — Ambulatory Visit: Payer: BLUE CROSS/BLUE SHIELD | Admitting: Neurology

## 2018-03-05 DIAGNOSIS — F329 Major depressive disorder, single episode, unspecified: Secondary | ICD-10-CM | POA: Diagnosis not present

## 2018-03-05 DIAGNOSIS — Z7902 Long term (current) use of antithrombotics/antiplatelets: Secondary | ICD-10-CM | POA: Diagnosis not present

## 2018-03-05 DIAGNOSIS — M545 Low back pain: Secondary | ICD-10-CM | POA: Diagnosis not present

## 2018-03-05 DIAGNOSIS — E1149 Type 2 diabetes mellitus with other diabetic neurological complication: Secondary | ICD-10-CM | POA: Diagnosis not present

## 2018-03-05 DIAGNOSIS — E785 Hyperlipidemia, unspecified: Secondary | ICD-10-CM | POA: Diagnosis not present

## 2018-03-05 DIAGNOSIS — M797 Fibromyalgia: Secondary | ICD-10-CM | POA: Diagnosis not present

## 2018-03-05 DIAGNOSIS — Z794 Long term (current) use of insulin: Secondary | ICD-10-CM | POA: Diagnosis not present

## 2018-03-05 DIAGNOSIS — I69351 Hemiplegia and hemiparesis following cerebral infarction affecting right dominant side: Secondary | ICD-10-CM | POA: Diagnosis not present

## 2018-03-05 DIAGNOSIS — Z79891 Long term (current) use of opiate analgesic: Secondary | ICD-10-CM | POA: Diagnosis not present

## 2018-03-05 DIAGNOSIS — I10 Essential (primary) hypertension: Secondary | ICD-10-CM | POA: Diagnosis not present

## 2018-03-05 DIAGNOSIS — G8929 Other chronic pain: Secondary | ICD-10-CM | POA: Diagnosis not present

## 2018-03-05 DIAGNOSIS — Z7982 Long term (current) use of aspirin: Secondary | ICD-10-CM | POA: Diagnosis not present

## 2018-03-05 DIAGNOSIS — I69320 Aphasia following cerebral infarction: Secondary | ICD-10-CM | POA: Diagnosis not present

## 2018-03-09 ENCOUNTER — Other Ambulatory Visit: Payer: Self-pay

## 2018-03-09 NOTE — Patient Outreach (Signed)
Telephone outreach to patient to obtain mRS was successfully completed. mRS = 2 

## 2018-03-10 ENCOUNTER — Other Ambulatory Visit: Payer: Self-pay

## 2018-03-10 ENCOUNTER — Encounter: Payer: Self-pay | Admitting: Speech Pathology

## 2018-03-10 ENCOUNTER — Ambulatory Visit: Payer: BLUE CROSS/BLUE SHIELD | Attending: Family Medicine | Admitting: Speech Pathology

## 2018-03-10 DIAGNOSIS — R4701 Aphasia: Secondary | ICD-10-CM | POA: Diagnosis not present

## 2018-03-10 NOTE — Patient Instructions (Signed)
  Brittany BlonderDenise is not understanding everything that is being said or everything she reads. Have someone go with her to doctor app appointments, when dealing with legal or insurance or financial issues.   Tips for Talking with People who have Aphasia  . Say one thing at a time . Don't  rush - slow down, be patient . Talk face to face . Reduce background noise . Relax - be natural . Use pen and paper . Write down key words . Draw diagrams or pictures . Don't pretend you understand . Ask what helps . Recap - check you both understand . Be a partner, not a therapist   Aphasia does not affect intelligence, only language. The person with aphasia can still: make decisions, have opinions, and socialize.   Describing words  What group does it belong to?  What do I use it for?  Where can I find it?  What does it LOOK like?  What other words go with it?  What is the 1st sound of the word?          Many Ways to Communicate  Describe it Write it Draw it Gesture it Use related words  There's an App for that: Family Feud, Heads up, Stop-fun categories, What if, Junie BameConversation TherAPPy  Provided by: Rolin BarryLaura L. ST, [phone]

## 2018-03-10 NOTE — Therapy (Signed)
[facility] [address] [place], [place], [address] Phone: [phone]   Fax:  [phone]  Speech Language Pathology Evaluation  Patient Details  Name: Brittany [name] MRN: [id number] Date of Birth: Sep 15, 1960 Referring Provider: Dr. Norberto SorensonEva Shaw   Encounter Date: 03/10/2018  End of Session - 03/10/18 1509    Visit Number  1    Number of Visits  25    Date for SLP Re-Evaluation  06/02/18    Authorization Type  Angelique BlonderDenise has 30  visit limit, has had HHST - will adjust visits accordingly. Darl PikesSusan says HHST used 3, leaving 27, however I believe she received more HHST per pt    Authorization Time Period  I initally scheduled for 2x a week for 8 weeks, will add on visists per POC as indicated    Authorization - Visit Number  27    SLP Start Time  1400    SLP Stop Time   1450    SLP Time Calculation (min)  50 min    Activity Tolerance  Patient tolerated treatment well       Past Medical History:  Diagnosis Date  . Chronic lower back pain   . Depression   . Fibromyalgia   . High cholesterol   . Seizures (HCC) 11/2017   "day after she came home from hospital after having stroke" (01/01/2018)  . Stroke [facility](HCC) 12/06/2017   "speech issues since" (01/01/2018)  . Type II diabetes mellitus (HCC)     Past Surgical History:  Procedure Laterality Date  . BACK SURGERY    . CHOLECYSTECTOMY    . INSERT / REPLACE / REMOVE PACEMAKER  01/01/2018  . IR CT HEAD LTD  12/06/2017  . IR PERCUTANEOUS ART THROMBECTOMY/INFUSION INTRACRANIAL INC DIAG ANGIO  12/06/2017  . LOOP RECORDER INSERTION N/A 12/12/2017   Procedure: LOOP RECORDER INSERTION;  Surgeon: Regan Lemmingamnitz, Will Martin, MD;  [facility]: [facility];  Service: Cardiovascular;  Laterality: N/A;  . LOOP RECORDER REMOVAL  01/01/2018  . LOOP RECORDER REMOVAL N/A 01/01/2018   Procedure: LOOP RECORDER REMOVAL;  Surgeon: Regan Lemmingamnitz, Will Martin, MD;  Location: [facility];  Service:  Cardiovascular;  Laterality: N/A;  . PACEMAKER IMPLANT N/A 01/01/2018   Procedure: PACEMAKER IMPLANT;  Surgeon: Regan Lemmingamnitz, Will Martin, MD;  Location: [facility];  Service: Cardiovascular;  Laterality: N/A;  . RADIOLOGY WITH ANESTHESIA N/A 12/06/2017   Procedure: CODE STROKE;  Surgeon: Julieanne Cottoneveshwar, Sanjeev, MD;  Location: [facility] OR;  Service: Radiology;  Laterality: N/A;  . TEE WITHOUT CARDIOVERSION N/A 12/10/2017   Procedure: TRANSESOPHAGEAL ECHOCARDIOGRAM (TEE);  Surgeon: Jake BatheSkains, Mark C, MD;  [facility];  Service: Cardiovascular;  Laterality: N/A;  . THROMBECTOMY FEMORAL ARTERY Right 12/07/2017   Procedure: THROMBECTOMY RIGHT FEMORAL ARTERY;  Surgeon: Maeola Harmanain, Brandon Christopher, MD;  [facility];  Service: Vascular;  Laterality: Right;  . TONSILLECTOMY      There were no vitals filed for this visit.  Subjective Assessment - 03/10/18 1457    Subjective  "I got the cloak with them but they stopped"    Patient is accompained by:  Family member son, Apolinar JunesBrandon    Currently in Pain?  No/denies         SLP Evaluation [facility] - 03/10/18 1457      SLP Visit Information   SLP Received On  03/10/18    Referring Provider  Dr. Norberto SorensonEva Shaw    Onset Date  12/06/17    Medical Diagnosis  Left MCA CVA      Subjective   Patient/Family Stated Goal  "To learn how to be able to speak with people"      General Information   Mobility Status  walks independently      Balance Screen   Has the patient fallen in the past 6 months  Yes 1x during seizure    How many times?  1    Has the patient had a decrease in activity level because of a fear of falling?   No    Is the patient reluctant to leave their home because of a fear of falling?   No      Prior Functional Status   Cognitive/Linguistic Baseline  Within functional limits    Type of Home  House     Lives With  Spouse    Available Support  Family;Friend(s)    Vocation  Full time employment      Cognition   Overall Cognitive Status   Difficult to assess    Difficult to assess due to  Impaired communication      Auditory Comprehension   Overall Auditory Comprehension  Impaired    Yes/No Questions  Impaired    Basic Immediate Environment Questions  75-100% accurate    Complex Questions  50-74% accurate    Commands  Impaired    One Step Basic Commands  75-100% accurate    Two Step Basic Commands  75-100% accurate    Multistep Basic Commands  25-49% accurate    Conversation  Simple    EffectiveTechniques  Extra processing time;Pausing;Repetition;Slowed speech;Stressing words;Visual/Gestural cues      Visual Recognition/Discrimination   Discrimination  Within Function Limits      Reading Comprehension   Reading Status  Impaired    Word level  76-100% accurate    Sentence Level  76-100% accurate    Paragraph Level  76-100% accurate simple 4 sentences paragraph (Mr. Scotty Court)    Functional Environmental (signs, name badge)  Not tested      Expression   Primary Mode of Expression  Verbal      Verbal Expression   Overall Verbal Expression  Impaired    Initiation  No impairment    Automatic Speech  -- WFL    Level of Generative/Spontaneous Verbalization  Conversation    Repetition  Impaired    Level of Impairment  Word level multisyllabic word    Naming  Impairment    Responsive  76-100% accurate    Confrontation  50-74% accurate    Convergent  Not tested    Divergent  50-74% accurate    Verbal Errors  Phonemic paraphasias;Neologisms;Jargon;Not aware of errors;Inconsistent    Pragmatics  No impairment    Effective Techniques  Open ended questions;Semantic cues;Sentence completion;Written cues      Written Expression   Dominant Hand  Right    Written Expression  Exceptions to [facility]    Dictation Ability  Word biographical       Oral Motor/Sensory Function   Overall Oral Motor/Sensory Function  Impaired at baseline    Labial ROM  Within Functional Limits    Labial Symmetry  Within Functional Limits     Labial Strength  Within Functional Limits    Labial Sensation  Within Functional Limits    Labial Coordination  Reduced    Lingual ROM  Within Functional Limits    Lingual Symmetry  Within Functional Limits    Lingual Strength  Within Functional Limits  Lingual Sensation  Within Functional Limits    Lingual Coordination  Reduced    Facial ROM  Within Functional Limits    Velum  Within Functional Limits    Mandible  Within Functional Limits    Overall Oral Motor/Sensory Function  essential tremor lips and tongue      Motor Speech   Overall Motor Speech  Impaired    Respiration  Within functional limits    Phonation  Hoarse likely due to tobacco    Resonance  Within functional limits    Articulation  Within functional limitis    Intelligibility  Intelligible    Motor Planning  Impaired    Level of Impairment  Phrase    Motor Speech Errors  Groping for words;Unaware                      SLP Education - 03/10/18 1508    Education Details  compensations for aphasia    Person(s) Educated  Patient;Child(ren)    Methods  Explanation;Demonstration;Handout    Comprehension  Verbal cues required;Need further instruction       SLP Short Term Goals - 03/10/18 1521      SLP SHORT TERM GOAL #1   Title  Pt will comprehend 3-4 phrase  description to ID or name object described 8/10x with occasional repetition and occasional min A    Time  6    Period  Weeks    Status  New      SLP SHORT TERM GOAL #2   Title  Pt will verbalize simple sentences with a given word with occasional min Aover 2 sessions.     Time  6    Period  Weeks    Status  New      SLP SHORT TERM GOAL #3   Title  Pt will name 10 items in a simple category with occasional min A over 2 sessions    Time  6    Period  Weeks    Status  New      SLP SHORT TERM GOAL #4   Title  Pt will ID aphasic errors in naming and sentence generation tasks, stop and attempt to self correct 6/10x iwth occasional min A     Time  6    Period  Weeks    Status  New      SLP SHORT TERM GOAL #5   Title  Pt will write simple phrase/sentence with given word, with occasional min A for error awareness    Time  6    Period  Weeks    Status  New       SLP Long Term Goals - 03/10/18 1531      SLP LONG TERM GOAL #1   Title  Pt will comprehend 8 minute simlple conversation with occasional min gestrual cues and repetition over 2 sessions    Time  12    Period  Weeks    Status  New      SLP LONG TERM GOAL #2   Title  Pt will ID aphasic errors/press of speech in simple conversation with occasional min A over 2 sessions    Time  12    Period  Weeks    Status  New      SLP LONG TERM GOAL #3   Title  Pt will comprehend 2-3 simple paragraph passage with minimal extended time and 80% accuracy    Time  12  Period  Weeks    Status  New      SLP LONG TERM GOAL #4   Title  Pt will generate functional written tasks (lists, checks, schedules, short email/letter ) with error ID 3/5 errors and occasional min A.    Time  12    Period  Weeks    Status  New       Plan - 03/10/18 1512    Clinical Impression Statement   Ms. Shiplett, a 57 y.o. female is referred s/p left MCA CVA wtih aphasia. She was hospitalized 12/06/17 to 12/12/17, ,then re-admitted due to seizures. She received HHST. Today, she presents with moderate Wernike's (fluent) type aphasia. Auditory comprehension is intact to simple yes/no?'s and 1-2 step basic commands. Automatic speech is intact. Repetition is impaired at multisyllabic word level with neoglogisms. Responsive naming 8/10, confrontation naming 5/10. Pt benefits from sentence completion and sematic cues. Pt named 9 animals in 1 minute (15 is WNL) with phonemic paraphasias (fricken/chicken; druck/duck) with no awareness. Cookie theft description is empty, with press of speech and neologisms. Conversation demonstrates press of speech, perponderance of real word paraphasias, phonemic paraphasias and  neologisms. Reading comprehension is relatively intact to simple 3-4 sentence paragraph. Pt reports having difficulty communicating with her family and states that she does not want to be around people now because of her aphasia. She has difficulty making needs know and sharing ideas/information. I  recommend skilled ST to maximize communication for improved indpendence, to reduce caregiver burden and for QOL.     Speech Therapy Frequency  2x / week    Duration  -- 12 weeks or total of 25 visits    Treatment/Interventions  SLP instruction and feedback;Environmental controls;Compensatory techniques;Cognitive reorganization;Functional tasks;Compensatory strategies;Patient/family education;Multimodal communcation approach;Internal/external aids;Language facilitation    Potential to Achieve Goals  Good    Potential Considerations  Severity of impairments    Consulted and Agree with Plan of Care  Patient;Family member/caregiver    Family Member Consulted  son, [name]       Patient will benefit from skilled therapeutic intervention in order to improve the following deficits and impairments:   Aphasia - Plan: SLP plan of care cert/re-cert    Problem List Patient Active Problem List   Diagnosis Date Noted  . History of tobacco use disorder 01/01/2018  . Syncope and collapse 01/01/2018  . 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 Acute right hemiparesis (HCC) 12/12/2017  . History of completed stroke 12/12/2017  . Leukocytosis 12/12/2017  . Hypokalemia 12/12/2017  . Hypotension   . 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 Poor compliance with CPAP treatment 03/04/2017  . OSA and COPD overlap syndrome (HCC) 03/04/2017  . Simple chronic bronchitis (HCC) 03/04/2017  . MCI (mild cognitive impairment) 11/27/2016  . Left sided lacunar infarction  (HCC) 03/11/2014  . Cerebrovascular small vessel disease 03/11/2014  . Insomnia, idiopathic 12/20/2013  . Snoring 12/06/2013  . Essential hypertension, benign 10/26/2012  . Hyperlipidemia LDL goal <70 10/26/2012  . Polypharmacy 10/26/2012  . DM (diabetes mellitus) (HCC) 10/13/2011  . Tremor 10/13/2011  . GERD (gastroesophageal reflux disease) 10/13/2011  . Depression 10/13/2011    Brittany [name], Brittany Journey MS, CCC-SLP 03/10/2018, 3:37 PM  [facility] [address] [place], [place], [address] Phone: [phone]   Fax:  [phone]  Name: Brittany [name] MRN:  [id number] Date of Birth: November 27, 1960

## 2018-03-17 ENCOUNTER — Ambulatory Visit: Payer: BLUE CROSS/BLUE SHIELD

## 2018-03-17 DIAGNOSIS — R4701 Aphasia: Secondary | ICD-10-CM | POA: Diagnosis not present

## 2018-03-17 NOTE — Therapy (Signed)
[facility] [address] [place], [place], [address] Phone: [phone]   Fax:  [phone]  Speech Language Pathology Treatment  Patient Details  Name: Brittany [name] MRN: [id number] Date of Birth: [birth year] Referring Provider: Dr. Norberto Sorenson   Encounter Date: 03/17/2018  End of Session - 03/17/18 1453    Visit Number  2    Number of Visits  25    Date for SLP Re-Evaluation  06/02/18    Authorization Type  Tinie has 30  visit limit, has had PT - will adjust visits accordingly. Darl Pikes says HHST used 3, leaving 27, however I believe she received more HHST per pt    Authorization - Visit Number  1    Authorization - Number of Visits  27    SLP Start Time  1406    SLP Stop Time   1446    SLP Time Calculation (min)  40 min    Activity Tolerance  Patient tolerated treatment well       Past Medical History:  Diagnosis Date  . Chronic lower back pain   . Depression   . Fibromyalgia   . High cholesterol   . Seizures (HCC) 11/2017   "day after she came home from hospital after having stroke" (01/01/2018)  . Stroke [facility]) 12/06/2017   "speech issues since" (01/01/2018)  . Type II diabetes mellitus (HCC)     Past Surgical History:  Procedure Laterality Date  . BACK SURGERY    . CHOLECYSTECTOMY    . INSERT / REPLACE / REMOVE PACEMAKER  01/01/2018  . IR CT HEAD LTD  12/06/2017  . IR PERCUTANEOUS ART THROMBECTOMY/INFUSION INTRACRANIAL INC DIAG ANGIO  12/06/2017  . LOOP RECORDER INSERTION N/A 12/12/2017   Procedure: LOOP RECORDER INSERTION;  Surgeon: Regan Lemming, MD;  [facility]: [facility];  Service: Cardiovascular;  Laterality: N/A;  . LOOP RECORDER REMOVAL  01/01/2018  . LOOP RECORDER REMOVAL N/A 01/01/2018   Procedure: LOOP RECORDER REMOVAL;  Surgeon: Regan Lemming, MD;  Location: [facility];  Service: Cardiovascular;  Laterality: N/A;  . PACEMAKER IMPLANT N/A 01/01/2018   Procedure: PACEMAKER  IMPLANT;  Surgeon: Regan Lemming, MD;  [facility]: [facility];  Service: Cardiovascular;  Laterality: N/A;  . RADIOLOGY WITH ANESTHESIA N/A 12/06/2017   Procedure: CODE STROKE;  Surgeon: Julieanne Cotton, MD;  Location: [facility] OR;  Service: Radiology;  Laterality: N/A;  . TEE WITHOUT CARDIOVERSION N/A 12/10/2017   Procedure: TRANSESOPHAGEAL ECHOCARDIOGRAM (TEE);  Surgeon: Jake Bathe, MD;  Location: [facility] ENDOSCOPY;  Service: Cardiovascular;  Laterality: N/A;  . THROMBECTOMY FEMORAL ARTERY Right 12/07/2017   Procedure: THROMBECTOMY RIGHT FEMORAL ARTERY;  Surgeon: Maeola Harman, MD;  Location: [facility] OR;  Service: Vascular;  Laterality: Right;  . TONSILLECTOMY      There were no vitals filed for this visit.  Subjective Assessment - 03/17/18 1411    Subjective  "I can always get it out but I get it out."    Patient is accompained by:  -- Son in law, Joy    Currently in Pain?  No/denies            ADULT SLP TREATMENT - 03/17/18 1413      General Information   Behavior/Cognition  Alert;Cooperative;Pleasant mood      Treatment Provided   Treatment provided  Cognitive-Linquistic      Cognitive-Linquistic Treatment   Treatment focused on  Aphasia    Skilled Treatment  Conversational speech only functional with much extra time and questions, and family assistance/clarification due to neologisms and literal paraphasias. Confrontational naming 30% incr'd 90% with phonemic, semantic, and written cues.       Assessment / Recommendations / Plan   Plan  Continue with current plan of care      Progression Toward Goals   Progression toward goals  Progressing toward goals         SLP Short Term Goals - 03/17/18 1454      SLP SHORT TERM GOAL #1   Title  Pt will comprehend 3-4 phrase  description to ID or name object described 8/10x with occasional repetition and occasional min A    Time  6    Period  Weeks    Status  On-going      SLP SHORT TERM GOAL #2   Title  Pt  will verbalize simple sentences with a given word with occasional min Aover 2 sessions.     Time  6    Period  Weeks    Status  On-going      SLP SHORT TERM GOAL #3   Title  Pt will name 10 items in a simple category with occasional min A over 2 sessions    Time  6    Period  Weeks    Status  On-going      SLP SHORT TERM GOAL #4   Title  Pt will ID aphasic errors in naming and sentence generation tasks, stop and attempt to self correct 6/10x iwth occasional min A    Time  6    Period  Weeks    Status  On-going      SLP SHORT TERM GOAL #5   Title  Pt will write simple phrase/sentence with given word, with occasional min A for error awareness    Time  6    Period  Weeks    Status  On-going       SLP Long Term Goals - 03/17/18 1455      SLP LONG TERM GOAL #1   Title  Pt will comprehend 8 minute simlple conversation with occasional min gestrual cues and repetition over 2 sessions    Time  12    Period  Weeks    Status  On-going      SLP LONG TERM GOAL #2   Title  Pt will ID aphasic errors/press of speech in simple conversation with occasional min A over 2 sessions    Time  12    Period  Weeks    Status  On-going      SLP LONG TERM GOAL #3   Title  Pt will comprehend 2-3 simple paragraph passage with minimal extended time and 80% accuracy    Time  12    Period  Weeks    Status  On-going      SLP LONG TERM GOAL #4   Title  Pt will generate functional written tasks (lists, checks, schedules, short email/letter ) with error ID 3/5 errors and occasional min A.    Time  12    Period  Weeks    Status  On-going       Plan - 03/17/18 1453    Clinical Impression Statement   Brittany [name] presents today with receptive and expressive aphasia, conversation only functional with max A. I recommend cont'd skilled ST to maximize communication for improved indpendence, to reduce caregiver burden and for QOL.     Speech  Therapy Frequency  2x / week    Duration  -- 12 weeks or total of  25 visits    Treatment/Interventions  SLP instruction and feedback;Environmental controls;Compensatory techniques;Cognitive reorganization;Functional tasks;Compensatory strategies;Patient/family education;Multimodal communcation approach;Internal/external aids;Language facilitation    Potential to Achieve Goals  Good    Potential Considerations  Severity of impairments    Consulted and Agree with Plan of Care  Patient;Family member/caregiver    Family Member Consulted  son, Apolinar JunesBrandon       Patient will benefit from skilled therapeutic intervention in order to improve the following deficits and impairments:   Aphasia    Problem List Patient Active Problem List   Diagnosis Date Noted  . History of tobacco use disorder 01/01/2018  . Syncope and collapse 01/01/2018  . 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 Acute right hemiparesis (HCC) 12/12/2017  . History of completed stroke 12/12/2017  . Leukocytosis 12/12/2017  . Hypokalemia 12/12/2017  . Hypotension   . 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 Poor compliance with CPAP treatment 03/04/2017  . OSA and COPD overlap syndrome (HCC) 03/04/2017  . Simple chronic bronchitis (HCC) 03/04/2017  . MCI (mild cognitive impairment) 11/27/2016  . Left sided lacunar infarction (HCC) 03/11/2014  . Cerebrovascular small vessel disease 03/11/2014  . Insomnia, idiopathic 12/20/2013  . Snoring 12/06/2013  . Essential hypertension, benign 10/26/2012  . Hyperlipidemia LDL goal <70 10/26/2012  . Polypharmacy 10/26/2012  . DM (diabetes mellitus) (HCC) 10/13/2011  . Tremor 10/13/2011  . GERD (gastroesophageal reflux disease) 10/13/2011  . Depression 10/13/2011    [facility] EndosurgeryCHINKE,Adriann Ballweg ,MS, CCC-SLP  03/17/2018, 2:55 PM  [facility] [address]  [address] [place], [place], [address] Phone: [phone]   Fax:  [phone]   Name: [name] MRN: [id number] Date of Birth: 1961/02/17

## 2018-03-17 NOTE — Patient Instructions (Signed)
  Please complete the assigned speech therapy homework prior to your next session and return it to the speech therapist at your next visit.  

## 2018-03-18 ENCOUNTER — Ambulatory Visit: Payer: BLUE CROSS/BLUE SHIELD | Admitting: Neurology

## 2018-03-20 ENCOUNTER — Ambulatory Visit: Payer: BLUE CROSS/BLUE SHIELD

## 2018-03-20 DIAGNOSIS — R4701 Aphasia: Secondary | ICD-10-CM | POA: Diagnosis not present

## 2018-03-20 NOTE — Patient Instructions (Signed)
  Please complete the assigned speech therapy homework prior to your next session and return it to the speech therapist at your next visit.  

## 2018-03-20 NOTE — Therapy (Signed)
[facility] [address] [place], [place], [address] Phone: [phone]   Fax:  [phone]  Speech Language Pathology Treatment  Patient Details  Name: Brittany [name] MRN: [id number] Date of Birth: 1961/04/02 Referring Provider: Dr. Norberto SorensonEva Shaw   Encounter Date: 03/20/2018  End of Session - 03/20/18 1639    Visit Number  3    Number of Visits  25    Date for SLP Re-Evaluation  06/02/18    Authorization - Visit Number  2    Authorization - Number of Visits  27    SLP Start Time  1535    SLP Stop Time   1617    SLP Time Calculation (min)  42 min       Past Medical History:  Diagnosis Date  . Chronic lower back pain   . Depression   . Fibromyalgia   . High cholesterol   . Seizures (HCC) 11/2017   "day after she came home from hospital after having stroke" (01/01/2018)  . Stroke [facility](HCC) 12/06/2017   "speech issues since" (01/01/2018)  . Type II diabetes mellitus (HCC)     Past Surgical History:  Procedure Laterality Date  . BACK SURGERY    . CHOLECYSTECTOMY    . INSERT / REPLACE / REMOVE PACEMAKER  01/01/2018  . IR CT HEAD LTD  12/06/2017  . IR PERCUTANEOUS ART THROMBECTOMY/INFUSION INTRACRANIAL INC DIAG ANGIO  12/06/2017  . LOOP RECORDER INSERTION N/A 12/12/2017   Procedure: LOOP RECORDER INSERTION;  Surgeon: Regan Lemmingamnitz, Will Martin, MD;  Location: [facility] IN[facility];  Service: Cardiovascular;  Laterality: N/A;  . LOOP RECORDER REMOVAL  01/01/2018  . LOOP RECORDER REMOVAL N/A 01/01/2018   Procedure: LOOP RECORDER REMOVAL;  Surgeon: Regan Lemmingamnitz, Will Martin, MD;  Location: [facility];  Service: Cardiovascular;  Laterality: N/A;  . PACEMAKER IMPLANT N/A 01/01/2018   Procedure: PACEMAKER IMPLANT;  Surgeon: Regan Lemmingamnitz, Will Martin, MD;  Location: [facility] CV [facility];  Service: Cardiovascular;  Laterality: N/A;  . RADIOLOGY WITH ANESTHESIA N/A 12/06/2017   Procedure: CODE STROKE;  Surgeon: Julieanne Cottoneveshwar, Sanjeev, MD;  Location:  [facility] OR;  Service: Radiology;  Laterality: N/A;  . TEE WITHOUT CARDIOVERSION N/A 12/10/2017   Procedure: TRANSESOPHAGEAL ECHOCARDIOGRAM (TEE);  Surgeon: Jake BatheSkains, Mark C, MD;  [facility];  Service: Cardiovascular;  Laterality: N/A;  . THROMBECTOMY FEMORAL ARTERY Right 12/07/2017   Procedure: THROMBECTOMY RIGHT FEMORAL ARTERY;  Surgeon: Maeola Harmanain, Brandon Christopher, MD;  [facility];  Service: Vascular;  Laterality: Right;  . TONSILLECTOMY      There were no vitals filed for this visit.  Subjective Assessment - 03/20/18 1536    Subjective  "They make it hard - the one day one and then the one day the other." (comment, when entering ST room)    Patient is accompained by:  -- friend    Currently in Pain?  No/denies            ADULT SLP TREATMENT - 03/20/18 1539      General Information   Behavior/Cognition  Alert;Cooperative;Pleasant mood      Treatment Provided   Treatment provided  Cognitive-Linquistic      Cognitive-Linquistic Treatment   Treatment focused on  Aphasia    Skilled Treatment  SLP worked with pt's verbal/written expression and error awareness using sentence completions. Pt req'd SLP mod-max cues for written errors occasionally, progressed to usually as task approached 25-30 minutes. SLP cues for verbal errors and for verbal and written  error awareness incr'd from rare to usual min to mod cues as task progressed to approx 30 minutes. SLP told pt to work for approx 30 minutes and then take at least a 10 minute break.      Assessment / Recommendations / Plan   Plan  Continue with current plan of care      Progression Toward Goals   Progression toward goals  Progressing toward goals         SLP Short Term Goals - 03/20/18 1641      SLP SHORT TERM GOAL #1   Title  Pt will comprehend 3-4 phrase  description to ID or name object described 8/10x with occasional repetition and occasional min A    Time  6    Period  Weeks    Status  On-going      SLP SHORT  TERM GOAL #2   Title  Pt will verbalize simple sentences with a given word with occasional min Aover 2 sessions.     Time  6    Period  Weeks    Status  On-going      SLP SHORT TERM GOAL #3   Title  Pt will name 10 items in a simple category with occasional min A over 2 sessions    Time  6    Period  Weeks    Status  On-going      SLP SHORT TERM GOAL #4   Title  Pt will ID aphasic errors in naming and sentence generation tasks, stop and attempt to self correct 6/10x iwth occasional min A    Time  6    Period  Weeks    Status  On-going      SLP SHORT TERM GOAL #5   Title  Pt will write simple phrase/sentence with given word, with occasional min A for error awareness    Time  6    Period  Weeks    Status  On-going       SLP Long Term Goals - 03/20/18 1641      SLP LONG TERM GOAL #1   Title  Pt will comprehend 8 minute simlple conversation with occasional min gestrual cues and repetition over 2 sessions    Time  12    Period  Weeks    Status  On-going      SLP LONG TERM GOAL #2   Title  Pt will ID aphasic errors/press of speech in simple conversation with occasional min A over 2 sessions    Time  12    Period  Weeks    Status  On-going      SLP LONG TERM GOAL #3   Title  Pt will comprehend 2-3 simple paragraph passage with minimal extended time and 80% accuracy    Time  12    Period  Weeks    Status  On-going      SLP LONG TERM GOAL #4   Title  Pt will generate functional written tasks (lists, checks, schedules, short email/letter ) with error ID 3/5 errors and occasional min A.    Time  12    Period  Weeks    Status  On-going       Plan - 03/20/18 1640    Clinical Impression Statement   [name] presents today with cont'd receptive and expressive aphasia. Pt able to work with incr'd cueing levels needed up to approx 30 minutes, when SLP suggested pt take a break. I recommend  cont'd skilled ST to maximize communication for improved indpendence, to reduce  caregiver burden and for QOL.     Speech Therapy Frequency  2x / week    Duration  -- 12 weeks or total of 25 visits    Treatment/Interventions  SLP instruction and feedback;Environmental controls;Compensatory techniques;Cognitive reorganization;Functional tasks;Compensatory strategies;Patient/family education;Multimodal communcation approach;Internal/external aids;Language facilitation    Potential to Achieve Goals  Good    Potential Considerations  Severity of impairments    Consulted and Agree with Plan of Care  Patient;Family member/caregiver    Family Member Consulted  son, [name]       Patient will benefit from skilled therapeutic intervention in order to improve the following deficits and impairments:   Aphasia    Problem List Patient Active Problem List   Diagnosis Date Noted  . History of tobacco use disorder 01/01/2018  . Syncope and collapse 01/01/2018  . 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 Acute right hemiparesis (HCC) 12/12/2017  . History of completed stroke 12/12/2017  . Leukocytosis 12/12/2017  . Hypokalemia 12/12/2017  . Hypotension   . 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 Poor compliance with CPAP treatment 03/04/2017  . OSA and COPD overlap syndrome (HCC) 03/04/2017  . Simple chronic bronchitis (HCC) 03/04/2017  . MCI (mild cognitive impairment) 11/27/2016  . Left sided lacunar infarction (HCC) 03/11/2014  . Cerebrovascular small vessel disease 03/11/2014  . Insomnia, idiopathic 12/20/2013  . Snoring 12/06/2013  . Essential hypertension, benign 10/26/2012  . Hyperlipidemia LDL goal <70 10/26/2012  . Polypharmacy 10/26/2012  . DM (diabetes mellitus) (HCC) 10/13/2011  . Tremor 10/13/2011  . GERD (gastroesophageal reflux disease) 10/13/2011  . Depression 10/13/2011    [facility] ,MS,  CCC-SLP  03/20/2018, 4:41 PM  [facility] [place] [address] [place], [place], [address] Phone: [phone]   Fax:  [phone]   Name: Brittany [name] MRN: [id number] Date of Birth: [birth year]

## 2018-03-23 ENCOUNTER — Ambulatory Visit (INDEPENDENT_AMBULATORY_CARE_PROVIDER_SITE_OTHER): Payer: BLUE CROSS/BLUE SHIELD | Admitting: Neurology

## 2018-03-23 ENCOUNTER — Encounter: Payer: Self-pay | Admitting: Neurology

## 2018-03-23 VITALS — BP 133/75 | HR 80 | Ht 63.0 in | Wt 159.0 lb

## 2018-03-23 DIAGNOSIS — Z9119 Patient's noncompliance with other medical treatment and regimen: Secondary | ICD-10-CM | POA: Diagnosis not present

## 2018-03-23 DIAGNOSIS — Z91199 Patient's noncompliance with other medical treatment and regimen due to unspecified reason: Secondary | ICD-10-CM

## 2018-03-23 DIAGNOSIS — I63412 Cerebral infarction due to embolism of left middle cerebral artery: Secondary | ICD-10-CM

## 2018-03-23 DIAGNOSIS — Z9114 Patient's other noncompliance with medication regimen: Secondary | ICD-10-CM | POA: Diagnosis not present

## 2018-03-23 NOTE — Patient Instructions (Signed)
We will discontinue CPAP based on the poor compliance data. The patient and her support system were unable to improve compliance, and she is less likely now after a stroke with personality changes.

## 2018-03-23 NOTE — Progress Notes (Signed)
[facility]  Provider:  Melvyn Novas, M D  Referring Provider: [name], MD Primary Care Physician:  [name], MD  Chief Complaint  Patient presents with  . Follow-up    pt with son, rm 39. pt has not been using her CPAP. stroke sz and pacemaker in april/may 2019. she has had  some memory difficulties. the pt didnt feel that the machine was making difference, [facility] [employer]. pt has aphasia from the stroke.   Today's 23 March 2018 and I meeting today with Brittany [name] and her son , she is  a 11 year old Caucasian female patient who suffered a stroke in late March 2019.  She also had a pacemaker implanted.  She suffered a seizure within a week after her stroke.  The patient reports her dogs ( large animals ) were pulling on her CPAP  hose and ripped the equipment. She also felt it was a costly therapy. Her son translated this for me as the patient has significant aphasia.   Dr.Sethi  wrote the following summary about his visit with the patient, and had urged her to follow-up regarding her currently untreated sleep apnea.  The patient had a acute left insular infarct suspected due to embolism.  Code stroke occurred on 06 December 2017 follow-up visit with Dr. Pearlean Brownie was on 21 December 2017, he also noted that the patient had still aphasia.   "PLAN per Dr. Pearlean Brownie: I had a long d/w patient and her son in law about her recent cryptogenic stroke, aphasia,risk for recurrent stroke/TIAs, personally independently reviewed imaging studies and stroke evaluation results and answered questions.Continue Plavixfor secondary stroke prevention and maintain strict control of hypertension with blood pressure goal below 130/90, diabetes with hemoglobin A1c goal below 6.5% and lipids with LDL cholesterol goal below 70 mg/dL. I also advised the patient to eat a healthy diet with plenty of whole grains, cereals, fruits and vegetables, exercise regularly and maintain ideal body weight.she was given a  prescription for Plavix and Lipitor.She was advised to continue outpatient speech therapy.continue Keppra was symptomatic seizures. She was advised not to drive for at least 6 months since her seizure. She still significantly disabled to return to work. Followup in the future with my nose practitioner Shanda Bumps in 3 months or call earlier if necessary."  The patient remains unmotivated to use CPAP and will discontinue the therapy now- She is sleeping with 2 dogs in the bed, and this doesn't allow her to use the CPAP- she is unwilling to leave the dogs outside. She cannot become compliant . She is tearful and not very insightful.     HPI: I have the pleasure of seeing Brittany [name] today on 03/04/2017, today's Mini-Mental Status Examination and Montral cognitive assessment are 29 out of 30 points her memory is excellent she only missed cube drawing. She endorses still an Epworth sleepiness score of 10 point fatigue severity of only 11 points, her home sleep test from January 22 of this year had shown an AHI of 12.9.  She is using an AutoSet between 5 and 15 cm water with 3 cm EPR average user time on days used is 4-1/2 [name] but her compliance is only 12 days of 30 days. Residual AHI is 3.3. She just started using CPAP on February 13 2017 , was unable to implement CPAP before - due to her 24/7 care of mother, father with Alzheimer's. Has not been able to quit smoking.  looking at the  Last 18 days  compliance is 80%.      Brittany [name] is a 57 y.o. caucasian , married , right handed  female . She was first and last seen in April 2014  seen here as a referral from Dr. Norberto Sorenson for a sleep problem. Brittany [name] reports having difficulties to fall asleep, but can stay asleep through the rest of the night.  Her husband shares her bedroom and her son lives in the parental home, too. Both report thunderous and loud snoring. The family dog ( one of 4) no longer sleeps in the bedroom, which her husband , who is almost  deaf, attributed  the sleep problems to.  The patient rises at 7:30 AM, she needs an alarm.   She feels often not refreshed. He does not take breakfast, she will have a cigarette and coffee at home, at work she will have a coffee and oatmeal. She works in a cubicle.The patient works for a [employer] and has variable [name]. On Monday and Wednesday from 9 AM to 19.30 [name], the rest of the week 8 AM  to 17.00. and Saturdays 9 to 12 . She has natural daylight exposure at her workplace. She has diabetes and feels that one hour after any meal she gets sleepy. When home she plays on the lab-computer and falls asleep in front of the screen , often for 3 [name]. She doesn't exercise and doesn't walk outside.  She transfers to the bedroom around 1.30 , some times falls asleep quickly, sometimes not. She remembers few dreams, feels she hardly dreams for a year or so. No nocturia. She first heart her husband complaining about her snoring about one year ago, after the removal of all maxillary teeth. She has gained over the last year, too.  She likes to sleep on one pillow, on the side.  Her bedroom is dark and quiet. Her husband watches TV in bed, but she switches it off when she comes to bed.  She had a remote cervical fusion with anterior access in 2010.  She used to work in a [employer] in her 20's , at  [employer] , with noise and dust exposure.  She is married for 30 years,  has 2 children, living still at home. The younger is 15.  I ordered a sleep study, but her insurance would not cover.   Established patient visit with new problem, dated 20th of December 2017. I see Brittany [name] today her last and first appointment with me was about 30 months ago. Her concern today is in regards to tremors and memory loss. She reports that she has noticed a tremor developing she still is fighting depression symptoms, fibromyalgia, high cholesterol, diabetes, nicotine abuse, and she continues to snore. As described  above a sleep study had not been covered been originally ordered. She reports that a father with dementia and a mother with Parkinson's disease. She is concerned that she may take either path. We performed a Montral cognitive assessment,  Only 22/30 points. She has short term memory loss.   Interval history from 11/27/2016. 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 Total desaturation time was 233 minutes. In lab titration was denied I will order an auto- titration for this patient is a CPAP between 5 and 15 cm water pressure. We also  reviewed the test results for comprehensive metabolic panel with a malonic acid and her primary care physician's TSH within normal limits this time, only the fasting  Glucose was elevated. I will asked Dr. Clelia Croft to share the last fasting lipid panel with me.  MRI from 09/11/2016 showed no abnormal lesions, normal ventricle size, normal intracranial flow voids, no microvascular disease. No mentioning of atrophy. Please review the Montral cognitive assessment below. The patient endorsed a day 7 points on the Epworth sleepiness score 9 points on the fatigue severity score.  I will order an auto titration for her.   Montreal Cognitive Assessment  03/04/2017 11/27/2016 08/28/2016  Visuospatial/ Executive (0/5) 4 4 4   Naming (0/3) 3 3 3   Attention: Read list of digits (0/2) 2 2 2   Attention: Read list of letters (0/1) 1 1 1   Attention: Serial 7 subtraction starting at 100 (0/3) 3 2 0  Language: Repeat phrase (0/2) 2 2 2   Language : Fluency (0/1) 1 1 0  Abstraction (0/2) 2 2 2   Delayed Recall (0/5) 5 4 2   Orientation (0/6) 6 6 6   Total 29 27 22   Adjusted Score (based on education) - 84 22    Brittany [name] has a history of diabetes not insulin-dependent, hyperlipidemia, she takes primidone for  essential tremor, Zoloft for depression, Atrovent for bronchitic and asthmatic episodes, Proventil inhaler for COPD ,Tessalon Pearles, chronic coughing  Cozaar for hypertension, liaglyoptin  for diabetes, Victoza for diabetes, Glucophage tablets for diabetes,  Review of Systems: Out of a complete 14 system review, the patient complains of only the following symptoms, and all other reviewed systems are negative. Snoring.  She has bronchitis inflammation, coughing.  She smokes, still.'   She has noted dry skin and depression over the last 24 month. Has been in menopausal.   Social History   Socioeconomic History  . Marital status: Married    Spouse name: [name]  . Number of children: 2  . Years of education: 81  . Highest education level: Not on file  Occupational History  . Not on file  Social Needs  . Financial resource strain: Not on file  . Food insecurity:    Worry: Not on file    Inability: Not on file  . Transportation needs:    Medical: Not on file    Non-medical: Not on file  Tobacco Use  . Smoking status: Former Smoker    Packs/day: 1.00    Years: 38.00    Pack years: 38.00    Types: Cigarettes    Last attempt to quit: 12/06/2017    Years since quitting: 0.2  . Smokeless tobacco: Never Used  Substance and Sexual Activity  . Alcohol use: Yes    Comment: 01/01/2018 "nothing since stroke 11/2017"  . Drug use: No  . Sexual activity: Not Currently  Lifestyle  . Physical activity:    Days per week: Not on file    Minutes per session: Not on file  . Stress: Not on file  Relationships  . Social connections:    Talks on phone: Not on file    Gets together: Not on file    Attends religious service: Not on file    Active member of club or organization: Not on file    Attends meetings of clubs or organizations: Not on file    Relationship status: Not on file  . Intimate partner violence:    Fear of current or ex partner: Not on file  Emotionally abused: Not on file     Physically abused: Not on file    Forced sexual activity: Not on file  Other Topics Concern  . Not on file  Social History Narrative   Patient is married ([name]) and lives at home with her family   Patient has two children.   Patient works at [employer].   Patient has a college education.   Patient is right-handed.   Patient drinks 3 cups of coffee M-F.       Family History  Problem Relation Age of Onset  . Cancer Mother   . Diabetes Mother   . Heart disease Mother   . Parkinsonism Mother   . Diabetes Father   . Heart disease Father   . Dementia Father   . Cancer Maternal Grandmother 62       lung  . COPD Maternal Aunt     Past Medical History:  Diagnosis Date  . Chronic lower back pain   . Depression   . Fibromyalgia   . High cholesterol   . Seizures (HCC) 11/2017   "day after she came home from hospital after having stroke" (01/01/2018)  . Stroke [facility]) 12/06/2017   "speech issues since" (01/01/2018)  . Type II diabetes mellitus (HCC)     Past Surgical History:  Procedure Laterality Date  . BACK SURGERY    . CHOLECYSTECTOMY    . INSERT / REPLACE / REMOVE PACEMAKER  01/01/2018  . IR CT HEAD LTD  12/06/2017  . IR PERCUTANEOUS ART THROMBECTOMY/INFUSION INTRACRANIAL INC DIAG ANGIO  12/06/2017  . LOOP RECORDER INSERTION N/A 12/12/2017   Procedure: LOOP RECORDER INSERTION;  Surgeon: Regan Lemming, MD;  Location: [facility] INVASIVE CV LAB;  Service: Cardiovascular;  Laterality: N/A;  . LOOP RECORDER REMOVAL  01/01/2018  . LOOP RECORDER REMOVAL N/A 01/01/2018   Procedure: LOOP RECORDER REMOVAL;  Surgeon: Regan Lemming, MD;  Location: [facility] INVASIVE CV LAB;  Service: Cardiovascular;  Laterality: N/A;  . PACEMAKER IMPLANT N/A 01/01/2018   Procedure: PACEMAKER IMPLANT;  Surgeon: Regan Lemming, MD;  Location: [facility] INVASIVE CV LAB;  Service: Cardiovascular;  Laterality: N/A;  . RADIOLOGY WITH ANESTHESIA N/A 12/06/2017   Procedure: CODE STROKE;  Surgeon:  Julieanne Cotton, MD;  Location: [facility] OR;  Service: Radiology;  Laterality: N/A;  . TEE WITHOUT CARDIOVERSION N/A 12/10/2017   Procedure: TRANSESOPHAGEAL ECHOCARDIOGRAM (TEE);  Surgeon: Jake Bathe, MD;  Location: [facility] ENDOSCOPY;  Service: Cardiovascular;  Laterality: N/A;  . THROMBECTOMY FEMORAL ARTERY Right 12/07/2017   Procedure: THROMBECTOMY RIGHT FEMORAL ARTERY;  Surgeon: Maeola Harman, MD;  Location: [facility] OR;  Service: Vascular;  Laterality: Right;  . TONSILLECTOMY      Current Outpatient Medications  Medication Sig Dispense Refill  . acetaminophen (TYLENOL) 500 MG tablet Take 1,000 mg by mouth every 6 (six) [name] as needed for fever (pain).    Marland Kitchen albuterol (PROVENTIL) (2.5 MG/3ML) 0.083% nebulizer solution Take 3 mLs (2.5 mg total) by nebulization every 6 (six) [name] as needed for wheezing or shortness of breath. (Patient not taking: Reported on 03/10/2018) 150 mL 1  . ARIPiprazole (ABILIFY) 2 MG tablet Take 1 tablet (2 mg total) by mouth daily. 30 tablet 4  . aspirin EC 81 MG tablet Take 81 mg by mouth daily.    Marland Kitchen atorvastatin (LIPITOR) 20 MG tablet Take 1 tablet (20 mg total) by mouth daily. 90 tablet 3  . B Complex-Biotin-FA (MULTI-B COMPLEX) CAPS Take one a day ,B complex  multivitamin over the counter or use prenatal vitamin. 90 capsule 0  . clopidogrel (PLAVIX) 75 MG tablet Take 1 tablet (75 mg total) by mouth daily. 30 tablet 11  . Continuous Blood Gluc Receiver (FREESTYLE LIBRE 14 DAY READER) DEVI 1 Units by Does not apply route 4 (four) times daily. 1 Device 0  . Continuous Blood Gluc Sensor (FREESTYLE LIBRE 14 DAY SENSOR) MISC Apply 1 Units topically every 14 (fourteen) days. 6 each 4  . insulin aspart (NOVOLOG FLEXPEN) 100 UNIT/ML FlexPen Check cbg prior to each meal.  Inject 5u if CBG 150-199, 10u if CBG 200-299, 15u if CBG >300 15 mL 11  . Insulin Detemir (LEVEMIR FLEXTOUCH) 100 UNIT/ML Pen Inject 40 Units into the skin daily at 10 pm. 45 mL 0  . Insulin Pen Needle (BD  PEN NEEDLE NANO U/F) 32G X 4 MM MISC USE WITH Levemir PEN 100 each 11  . irbesartan (AVAPRO) 150 MG tablet Take 1 tablet (150 mg total) by mouth daily. 90 tablet 1  . levETIRAcetam (KEPPRA) 500 MG tablet TAKE 1 TABLET BY MOUTH TWICE A DAY 180 tablet 0  . sertraline (ZOLOFT) 100 MG tablet Take 2 tablets (200 mg total) by mouth at bedtime. 180 tablet 3   No current facility-administered medications for this visit.    Facility-Administered Medications Ordered in Other Visits  Medication Dose Route Frequency Provider Last Rate Last Dose  . gadopentetate dimeglumine (MAGNEVIST) injection 15 mL  15 mL Intravenous Once PRN Luvena Wentling, Porfirio Mylar, MD        Allergies as of 03/23/2018  . (No Known Allergies)    Vitals: BP 133/75   Pulse 80   Ht 5\' 3"  (1.6 m)   Wt 159 lb (72.1 kg)   BMI 28.17 kg/m  Last Weight:  Wt Readings from Last 1 Encounters:  03/23/18 159 lb (72.1 kg)   Last Height:   Ht Readings from Last 1 Encounters:  03/23/18 5\' 3"  (1.6 m)    Physical exam:  General: The patient is awake,but irritable and agitated. Head: Normocephalic, atraumatic.  Neck is supple. Mallampati 4 , neck circumference: 15 inches, over bite ( retrognathia)  now with complete upper dentures partially improved, TMJ click but not pain.  Normal swallowing.  Cardiovascular:  Regular rate and rhythm  without  murmurs or carotid bruit, and without distended neck veins. Respiratory: Lungs are clear to auscultation. Skin:  Without evidence of edema, or rash- she has a tooth abscess that caused her left cheek to swell.  Trunk: BMI is  elevated and patient  has normal posture.  I did not repeat the neurological evaluation and exam.       Montreal Cognitive Assessment  03/04/2017 11/27/2016 08/28/2016  Visuospatial/ Executive (0/5) 4 4 4   Naming (0/3) 3 3 3   Attention: Read list of digits (0/2) 2 2 2   Attention: Read list of letters (0/1) 1 1 1   Attention: Serial 7 subtraction starting at 100 (0/3) 3 2 0   Language: Repeat phrase (0/2) 2 2 2   Language : Fluency (0/1) 1 1 0  Abstraction (0/2) 2 2 2   Delayed Recall (0/5) 5 4 2   Orientation (0/6) 6 6 6   Total 29 27 22   Adjusted Score (based on education) - 27 22      Assessment:  After physical and neurologic examination, review of laboratory studies, imaging, neurophysiology testing and pre-existing records, assessment is :  Patient with diabetes , weight gain, retrognathia,  a history of fibromyalgia, and a  5 year history of snoring since she had full dentures for the upper jaw.  She presents with declining memory function, essential tremor and is worried about developing dementia and/ or PD.   1) she has mild OSA and [facility] . Heart rate variability was unusual with tachycardia events at night. COPD overlap with OSA.  Started CPAP - was not compliant- we discussed the reasons and it is her being overwhelmed with caretaker duties. She was  working full time, her husband and son help with the care of the Alzheimer's patient , who is paranoid.  The patient now reports her dogs don't permit her using CPAP and she hasn't used CPAP for over 7 month. She d/c CPAP in December.    2)Had a TIA 3 years ago (?) . Brain MRI was normal, one cyst remarked upon by Dr Pearlean BrownieSethi . No additional lacunes mentioned. I like her to take baby aspirin daily. She takes Multivitamins now. Had a embolic stroke December 06 2017, and since then significantly impaired - aphasia, tearful, appears jittery- she lost weight prior to the stroke now back at 158 pounds.   3) diabetes.   4) smoking cessation -[facility]. She was still thinking about it. She stopped in April 2019 after her severe stroke.    I will take  Brittany [name] off CPAP as I cannot see her to comply , she will not need to return for  CPAP .  She will follow up with Dr. Pearlean BrownieSethi and Np Shanda BumpsJessica VonSchaick.    Melvyn Novasarmen Talon Witting, MD    Cc Dr Clelia CroftShaw,  MD

## 2018-03-24 ENCOUNTER — Ambulatory Visit: Payer: BLUE CROSS/BLUE SHIELD

## 2018-03-24 DIAGNOSIS — R4701 Aphasia: Secondary | ICD-10-CM | POA: Diagnosis not present

## 2018-03-24 NOTE — Therapy (Signed)
[facility] [address] [place], [place], [address] Phone: [phone]   Fax:  [phone]  Speech Language Pathology Treatment  Patient Details  Name: Brittany CampusDenise W Marin MRN: [id number] Date of Birth: 1961/05/12 Referring Provider: Dr. Norberto SorensonEva Shaw   Encounter Date: 03/24/2018  End of Session - 03/24/18 1706    Visit Number  4    Number of Visits  25    Date for SLP Re-Evaluation  06/02/18    Authorization - Visit Number  3    Authorization - Number of Visits  27    SLP Start Time  1105    SLP Stop Time   1145    SLP Time Calculation (min)  40 min       Past Medical History:  Diagnosis Date  . Chronic lower back pain   . Depression   . Fibromyalgia   . High cholesterol   . Seizures (HCC) 11/2017   "day after she came home from hospital after having stroke" (01/01/2018)  . Stroke [facility](HCC) 12/06/2017   "speech issues since" (01/01/2018)  . Type II diabetes mellitus (HCC)     Past Surgical History:  Procedure Laterality Date  . BACK SURGERY    . CHOLECYSTECTOMY    . INSERT / REPLACE / REMOVE PACEMAKER  01/01/2018  . IR CT HEAD LTD  12/06/2017  . IR PERCUTANEOUS ART THROMBECTOMY/INFUSION INTRACRANIAL INC DIAG ANGIO  12/06/2017  . LOOP RECORDER INSERTION N/A 12/12/2017   Procedure: LOOP RECORDER INSERTION;  Surgeon: Regan Lemmingamnitz, Will Martin, MD;  [facility]: [facility];  Service: Cardiovascular;  Laterality: N/A;  . LOOP RECORDER REMOVAL  01/01/2018  . LOOP RECORDER REMOVAL N/A 01/01/2018   Procedure: LOOP RECORDER REMOVAL;  Surgeon: Regan Lemmingamnitz, Will Martin, MD;  Location: [facility];  Service: Cardiovascular;  Laterality: N/A;  . PACEMAKER IMPLANT N/A 01/01/2018   Procedure: PACEMAKER IMPLANT;  Surgeon: Regan Lemmingamnitz, Will Martin, MD;  Location: [facility];  Service: Cardiovascular;  Laterality: N/A;  . RADIOLOGY WITH ANESTHESIA N/A 12/06/2017   Procedure: CODE STROKE;  Surgeon: Julieanne Cottoneveshwar, Sanjeev, MD;  Location:  [facility] OR;  Service: Radiology;  Laterality: N/A;  . TEE WITHOUT CARDIOVERSION N/A 12/10/2017   Procedure: TRANSESOPHAGEAL ECHOCARDIOGRAM (TEE);  Surgeon: Jake BatheSkains, Mark C, MD;  [facility];  Service: Cardiovascular;  Laterality: N/A;  . THROMBECTOMY FEMORAL ARTERY Right 12/07/2017   Procedure: THROMBECTOMY RIGHT FEMORAL ARTERY;  Surgeon: Maeola Harmanain, Brandon Christopher, MD;  [facility];  Service: Vascular;  Laterality: Right;  . TONSILLECTOMY      There were no vitals filed for this visit.  Subjective Assessment - 03/24/18 1109    Subjective  "I thought you weren't here."    Currently in Pain?  No/denies            ADULT SLP TREATMENT - 03/24/18 1110      General Information   Behavior/Cognition  Alert;Cooperative;Pleasant mood      Treatment Provided   Treatment provided  Cognitive-Linquistic      Cognitive-Linquistic Treatment   Treatment focused on  Aphasia    Skilled Treatment  SLP reviewed pt's homework with her to incr her error awareness. Pt demo'd awareness 2/6 times. When SLP asked for elaboration pt errors were mostly producing those of morphologic or syntactic nature. Pt awareness of errors minimal (<20%). Pt simple conversation today req'd SLP usual (up to 80% of the time) questioning cues.      Assessment / Recommendations / Plan  Plan  Continue with current plan of care      Progression Toward Goals   Progression toward goals  Progressing toward goals         SLP Short Term Goals - 03/24/18 1707      SLP SHORT TERM GOAL #1   Title  Pt will comprehend 3-4 phrase  description to ID or name object described 8/10x with occasional repetition and occasional min A    Time  5    Period  Weeks    Status  On-going      SLP SHORT TERM GOAL #2   Title  Pt will verbalize simple sentences with a given word with occasional min Aover 2 sessions.     Time  5    Period  Weeks    Status  On-going      SLP SHORT TERM GOAL #3   Title  Pt will name 10 items in a  simple category with occasional min A over 2 sessions    Time  5    Period  Weeks    Status  On-going      SLP SHORT TERM GOAL #4   Title  Pt will ID aphasic errors in naming and sentence generation tasks, stop and attempt to self correct 6/10x iwth occasional min A    Time  5    Period  Weeks    Status  On-going      SLP SHORT TERM GOAL #5   Title  Pt will write simple phrase/sentence with given word, with occasional min A for error awareness    Time  5    Period  Weeks    Status  On-going       SLP Long Term Goals - 03/24/18 1707      SLP LONG TERM GOAL #1   Title  Pt will comprehend 8 minute simlple conversation with occasional min gestrual cues and repetition over 2 sessions    Time  11    Period  Weeks    Status  On-going      SLP LONG TERM GOAL #2   Title  Pt will ID aphasic errors/press of speech in simple conversation with occasional min A over 2 sessions    Time  11    Period  Weeks    Status  On-going      SLP LONG TERM GOAL #3   Title  Pt will comprehend 2-3 simple paragraph passage with minimal extended time and 80% accuracy    Time  11    Period  Weeks    Status  On-going      SLP LONG TERM GOAL #4   Title  Pt will generate functional written tasks (lists, checks, schedules, short email/letter ) with error ID 3/5 errors and occasional min A.    Time  11    Period  Weeks    Status  On-going       Plan - 03/24/18 1706    Clinical Impression Statement   Ms. Schue presents today with cont'd receptive and expressive aphasia. PT simple conversation cont to require cues for complete listener comprehension. I recommend cont'd skilled ST to maximize communication for improved indpendence, to reduce caregiver burden and for QOL.     Speech Therapy Frequency  2x / week    Duration  -- 12 weeks/25 visits    Treatment/Interventions  SLP instruction and feedback;Environmental controls;Compensatory techniques;Cognitive reorganization;Functional tasks;Compensatory  strategies;Patient/family education;Multimodal communcation approach;Internal/external aids;Language facilitation  Potential to Achieve Goals  Good    Potential Considerations  Severity of impairments       Patient will benefit from skilled therapeutic intervention in order to improve the following deficits and impairments:   Aphasia    Problem List Patient Active Problem List   Diagnosis Date Noted  . Non compliance with medical treatment 03/23/2018  . History of tobacco use disorder 01/01/2018  . Syncope and collapse 01/01/2018  . 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 Acute right hemiparesis (HCC) 12/12/2017  . History of completed stroke 12/12/2017  . Leukocytosis 12/12/2017  . Hypokalemia 12/12/2017  . Hypotension   . 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 Poor compliance with CPAP treatment 03/04/2017  . OSA and COPD overlap syndrome (HCC) 03/04/2017  . Simple chronic bronchitis (HCC) 03/04/2017  . MCI (mild cognitive impairment) 11/27/2016  . Left sided lacunar infarction (HCC) 03/11/2014  . Cerebrovascular small vessel disease 03/11/2014  . Insomnia, idiopathic 12/20/2013  . Snoring 12/06/2013  . Essential hypertension, benign 10/26/2012  . Hyperlipidemia LDL goal <70 10/26/2012  . Polypharmacy 10/26/2012  . DM (diabetes mellitus) (HCC) 10/13/2011  . Tremor 10/13/2011  . GERD (gastroesophageal reflux disease) 10/13/2011  . Depression 10/13/2011    [facility] ,MS, CCC-SLP  03/24/2018, 5:08 PM  [facility] [address] [place], [place], [address] Phone: [phone]   Fax:  [phone]   Name: RENLEIGH OUELLET MRN: [id number] Date of Birth: [birth year]

## 2018-03-24 NOTE — Patient Instructions (Signed)
  Please complete the assigned speech therapy homework prior to your next session and return it to the speech therapist at your next visit.  

## 2018-03-26 ENCOUNTER — Ambulatory Visit: Payer: BLUE CROSS/BLUE SHIELD

## 2018-03-31 ENCOUNTER — Encounter: Payer: Self-pay | Admitting: Speech Pathology

## 2018-03-31 ENCOUNTER — Ambulatory Visit: Payer: BLUE CROSS/BLUE SHIELD | Admitting: Speech Pathology

## 2018-03-31 DIAGNOSIS — R4701 Aphasia: Secondary | ICD-10-CM | POA: Diagnosis not present

## 2018-03-31 NOTE — Patient Instructions (Signed)
    Get the persons attention before you speak  Use eye contact and face the person you are speaking to  Be in close proximity to the person you are speaking to  Turn down any noise in the environment such as the TV, walk away from loud appliances, air conditioners, fans, dish washers etc  Closing your eyes does help you concentrate, as does limiting background noises when you are trying to talk  Make a list of activities you enjoy(ed) - hobbies, restaurants, family names, TV shows, stores, favorite foods, places, friends, vacation places, work terms, things that the kids like - toys, TV shows the grand kids like, their favorite books, places, foods, games, family birthdays and bring it in so we can practice those words, and words related to them   List 5 knitting terms,

## 2018-03-31 NOTE — Therapy (Signed)
[facility] [address] [place], [place], [address] Phone: [phone]   Fax:  [phone]  Speech Language Pathology Treatment  Patient Details  Name: Brittany [name] MRN: [id number] Date of Birth: 19-May-1961 Referring Provider: Dr. Norberto SorensonEva Shaw   Encounter Date: 03/31/2018  End of Session - 03/31/18 1218    Visit Number  5    Number of Visits  25    Date for SLP Re-Evaluation  06/02/18    Authorization - Visit Number  4    Authorization - Number of Visits  27    SLP Start Time  0933    SLP Stop Time   1016    SLP Time Calculation (min)  43 min    Activity Tolerance  Patient tolerated treatment well       Past Medical History:  Diagnosis Date  . Chronic lower back pain   . Depression   . Fibromyalgia   . High cholesterol   . Seizures (HCC) 11/2017   "day after she came home from hospital after having stroke" (01/01/2018)  . Stroke [facility](HCC) 12/06/2017   "speech issues since" (01/01/2018)  . Type II diabetes mellitus (HCC)     Past Surgical History:  Procedure Laterality Date  . BACK SURGERY    . CHOLECYSTECTOMY    . INSERT / REPLACE / REMOVE PACEMAKER  01/01/2018  . IR CT HEAD LTD  12/06/2017  . IR PERCUTANEOUS ART THROMBECTOMY/INFUSION INTRACRANIAL INC DIAG ANGIO  12/06/2017  . LOOP RECORDER INSERTION N/A 12/12/2017   Procedure: LOOP RECORDER INSERTION;  Surgeon: Regan Lemmingamnitz, Will Martin, MD;  [facility];  Service: Cardiovascular;  Laterality: N/A;  . LOOP RECORDER REMOVAL  01/01/2018  . LOOP RECORDER REMOVAL N/A 01/01/2018   Procedure: LOOP RECORDER REMOVAL;  Surgeon: Regan Lemmingamnitz, Will Martin, MD;  [facility]: [facility];  Service: Cardiovascular;  Laterality: N/A;  . PACEMAKER IMPLANT N/A 01/01/2018   Procedure: PACEMAKER IMPLANT;  Surgeon: Regan Lemmingamnitz, Will Martin, MD;  [facility];  Service: Cardiovascular;  Laterality: N/A;  . RADIOLOGY WITH ANESTHESIA N/A 12/06/2017   Procedure:  CODE STROKE;  Surgeon: Julieanne Cottoneveshwar, Sanjeev, MD;  Location: [facility] OR;  Service: Radiology;  Laterality: N/A;  . TEE WITHOUT CARDIOVERSION N/A 12/10/2017   Procedure: TRANSESOPHAGEAL ECHOCARDIOGRAM (TEE);  Surgeon: Jake BatheSkains, Mark C, MD;  [facility];  Service: Cardiovascular;  Laterality: N/A;  . THROMBECTOMY FEMORAL ARTERY Right 12/07/2017   Procedure: THROMBECTOMY RIGHT FEMORAL ARTERY;  Surgeon: Maeola Harmanain, Brandon Christopher, MD;  [facility];  Service: Vascular;  Laterality: Right;  . TONSILLECTOMY      There were no vitals filed for this visit.  Subjective Assessment - 03/31/18 1206    Subjective  "I can't even patty cake" with grands - pt tearful    Patient is accompained by:  Family member son            ADULT SLP TREATMENT - 03/31/18 1206      General Information   Behavior/Cognition  Alert;Cooperative;Pleasant mood      Treatment Provided   Treatment provided  Cognitive-Linquistic      Cognitive-Linquistic Treatment   Treatment focused on  Aphasia    Skilled Treatment  Pt tearful today about not being able to read to grands or play patty cake. We practiced patty cake with slow rate, in unison with ST exagerrating articulatory placement with usual min A for reduced rate and to watch my mouth with 50% accuracy. Also utilize phrase  completion with patty cake with initial articulatory placement with 70% accuracy. Faciliated written verbal and written expression with pt generating list/names of her hobbies and her grandkids favorite activities/characters/toys with usual mod A for written expression, however pt ID'd errors , with fill in the bland cues to correct. Pt's HW is complete and correct, I questioned son and pt re: accuracy of written homework and difficulty with written word in ST. Pt writes [employer] answers on separte sheet and her family cues her on a white board, then she fills in correct answer on HW sheet. Son is reporting appriopriate cueing strategies at home for written  expression. Verbal expression facilitated with desciption of photo in her phone of her husband with grandkids - pt explained what was happending and why in the photo with occasional questions for clarification due to aphasic errors (retarded/retired) , also with cues for awareness of verbal errors.      Assessment / Recommendations / Plan   Plan  Continue with current plan of care      Progression Toward Goals   Progression toward goals  Progressing toward goals       SLP Education - 03/31/18 1214    Education Details  generate list of words/phrases important to you - see pt instructions    Person(s) Educated  Patient;Child(ren)    Methods  Explanation;Demonstration    Comprehension  Verbalized understanding;Returned demonstration       SLP Short Term Goals - 03/31/18 1217      SLP SHORT TERM GOAL #1   Title  Pt will comprehend 3-4 phrase  description to ID or name object described 8/10x with occasional repetition and occasional min A    Time  4    Period  Weeks    Status  On-going      SLP SHORT TERM GOAL #2   Title  Pt will verbalize simple sentences with a given word with occasional min Aover 2 sessions.     Time  4    Period  Weeks    Status  On-going      SLP SHORT TERM GOAL #3   Title  Pt will name 10 items in a simple category with occasional min A over 2 sessions    Time  4    Period  Weeks    Status  On-going      SLP SHORT TERM GOAL #4   Title  Pt will ID aphasic errors in naming and sentence generation tasks, stop and attempt to self correct 6/10x iwth occasional min A    Time  5    Period  Weeks    Status  On-going      SLP SHORT TERM GOAL #5   Title  Pt will write simple phrase/sentence with given word, with occasional min A for error awareness    Time  5    Period  Weeks    Status  On-going       SLP Long Term Goals - 03/31/18 1218      SLP LONG TERM GOAL #1   Title  Pt will comprehend 8 minute simlple conversation with occasional min gestrual cues  and repetition over 2 sessions    Time  10    Period  Weeks    Status  On-going      SLP LONG TERM GOAL #2   Title  Pt will ID aphasic errors/press of speech in simple conversation with occasional min A over 2 sessions  Time  10    Period  Weeks    Status  On-going      SLP LONG TERM GOAL #3   Title  Pt will comprehend 2-3 simple paragraph passage with minimal extended time and 80% accuracy    Time  10    Period  Weeks    Status  On-going      SLP LONG TERM GOAL #4   Title  Pt will generate functional written tasks (lists, checks, schedules, short email/letter ) with error ID 3/5 errors and occasional min A.    Time  10    Period  Weeks    Status  On-going       Plan - 03/31/18 1215    Clinical Impression Statement  Pt continues to present with moderate aphasia affecting her receptive and expressive language. Conversation continues to exhibit signficant paraphasias and word finding affecting her ability to communicate with family. Continue skilled ST to maximize communication for improved independence and to reduce caregiver burden    Speech Therapy Frequency  2x / week    Duration  -- 12 weeks or 25 visits tota    Treatment/Interventions  SLP instruction and feedback;Environmental controls;Compensatory techniques;Cognitive reorganization;Functional tasks;Compensatory strategies;Patient/family education;Multimodal communcation approach;Internal/external aids;Language facilitation    Potential to Achieve Goals  Good    Potential Considerations  Severity of impairments       Patient will benefit from skilled therapeutic intervention in order to improve the following deficits and impairments:   Aphasia    Problem List Patient Active Problem List   Diagnosis Date Noted  . Non compliance with medical treatment 03/23/2018  . History of tobacco use disorder 01/01/2018  . Syncope and collapse 01/01/2018  . 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 Acute right hemiparesis (HCC) 12/12/2017  . History of completed stroke 12/12/2017  . Leukocytosis 12/12/2017  . Hypokalemia 12/12/2017  . Hypotension   . 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 Poor compliance with CPAP treatment 03/04/2017  . OSA and COPD overlap syndrome (HCC) 03/04/2017  . Simple chronic bronchitis (HCC) 03/04/2017  . MCI (mild cognitive impairment) 11/27/2016  . Left sided lacunar infarction (HCC) 03/11/2014  . Cerebrovascular small vessel disease 03/11/2014  . Insomnia, idiopathic 12/20/2013  . Snoring 12/06/2013  . Essential hypertension, benign 10/26/2012  . Hyperlipidemia LDL goal <70 10/26/2012  . Polypharmacy 10/26/2012  . DM (diabetes mellitus) (HCC) 10/13/2011  . Tremor 10/13/2011  . GERD (gastroesophageal reflux disease) 10/13/2011  . Depression 10/13/2011    Camera Krienke, Radene Journey MS, CCC-SLP 03/31/2018, 12:19 PM  [place] [facility] [address] [place], [place], [address] Phone: [phone]   Fax:  [phone]   Name: EMERI ESTILL MRN: [id number] Date of Birth: Oct 20, 1960

## 2018-04-02 ENCOUNTER — Ambulatory Visit: Payer: BLUE CROSS/BLUE SHIELD

## 2018-04-02 DIAGNOSIS — R4701 Aphasia: Secondary | ICD-10-CM | POA: Diagnosis not present

## 2018-04-02 NOTE — Patient Instructions (Signed)
  Please complete the assigned speech therapy homework prior to your next session and return it to the speech therapist at your next visit.  

## 2018-04-02 NOTE — Therapy (Signed)
[facility] [address] [place], [place], [address] Phone: [phone]   Fax:  [phone]  Speech Language Pathology Treatment  Patient Details  Name: Brittany [name] MRN: [id number] Date of Birth: 09-Nov-1960 Referring Provider: Dr. Norberto Sorenson   Encounter Date: 04/02/2018  End of Session - 04/02/18 1726    Visit Number  6    Number of Visits  25    Date for SLP Re-Evaluation  06/02/18    Authorization Type  Tamira has 30  visit limit, has had PT - will adjust visits accordingly. Darl Pikes says HHST used 3, leaving 27, however I believe she received more HHST per pt    Authorization Time Period  I initally scheduled for 2x a week for 8 weeks, will add on visists per POC as indicated    Authorization - Visit Number  5    Authorization - Number of Visits  27    SLP Start Time  0935    SLP Stop Time   1015    SLP Time Calculation (min)  40 min    Activity Tolerance  Patient tolerated treatment well       Past Medical History:  Diagnosis Date  . Chronic lower back pain   . Depression   . Fibromyalgia   . High cholesterol   . Seizures (HCC) 11/2017   "day after she came home from hospital after having stroke" (01/01/2018)  . Stroke [facility]) 12/06/2017   "speech issues since" (01/01/2018)  . Type II diabetes mellitus (HCC)     Past Surgical History:  Procedure Laterality Date  . BACK SURGERY    . CHOLECYSTECTOMY    . INSERT / REPLACE / REMOVE PACEMAKER  01/01/2018  . IR CT HEAD LTD  12/06/2017  . IR PERCUTANEOUS ART THROMBECTOMY/INFUSION INTRACRANIAL INC DIAG ANGIO  12/06/2017  . LOOP RECORDER INSERTION N/A 12/12/2017   Procedure: LOOP RECORDER INSERTION;  Surgeon: Regan Lemming, MD;  [facility]: [facility] INVASIVE CV LAB;  Service: Cardiovascular;  Laterality: N/A;  . LOOP RECORDER REMOVAL  01/01/2018  . LOOP RECORDER REMOVAL N/A 01/01/2018   Procedure: LOOP RECORDER REMOVAL;  Surgeon: Regan Lemming, MD;  Location: [facility]  INVASIVE CV LAB;  Service: Cardiovascular;  Laterality: N/A;  . PACEMAKER IMPLANT N/A 01/01/2018   Procedure: PACEMAKER IMPLANT;  Surgeon: Regan Lemming, MD;  [facility]: [facility] INVASIVE CV LAB;  Service: Cardiovascular;  Laterality: N/A;  . RADIOLOGY WITH ANESTHESIA N/A 12/06/2017   Procedure: CODE STROKE;  Surgeon: Julieanne Cotton, MD;  Location: [facility] OR;  Service: Radiology;  Laterality: N/A;  . TEE WITHOUT CARDIOVERSION N/A 12/10/2017   Procedure: TRANSESOPHAGEAL ECHOCARDIOGRAM (TEE);  Surgeon: Jake Bathe, MD;  Location: [facility] ENDOSCOPY;  Service: Cardiovascular;  Laterality: N/A;  . THROMBECTOMY FEMORAL ARTERY Right 12/07/2017   Procedure: THROMBECTOMY RIGHT FEMORAL ARTERY;  Surgeon: Maeola Harman, MD;  Location: [facility] OR;  Service: Vascular;  Laterality: Right;  . TONSILLECTOMY      There were no vitals filed for this visit.  Subjective Assessment - 04/02/18 0936    Subjective  "Everything's messed up. She got the one - - thing and the other county too."    Currently in Pain?  No/denies            ADULT SLP TREATMENT - 04/02/18 0936      General Information   Behavior/Cognition  Alert;Cooperative;Pleasant mood      Treatment Provided   Treatment provided  Cognitive-Linquistic  Cognitive-Linquistic Treatment   Treatment focused on  Aphasia    Skilled Treatment  Reviewed homework with pt - she was supposed to write down things of high interest. SLP provided mod A usually for pt's spelling. Pt did not finish entire assignment (did not take pictures, knitting words, shows, goals) - SLP told her to complete. Pt gave SLP a "sour"face when she was told to do this. Pt affirmed there were people at home who would help her if she needed it but indicated she was uncomfortable asking them. SLP encouraged pt to ask for help. "Is it getting worser?" pt asked SLP at the end of the session today. SLP educated pt that other factors can make talking worse from day to day - stress,  etc.       Assessment / Recommendations / Plan   Plan  Continue with current plan of care      Progression Toward Goals   Progression toward goals  Progressing toward goals       SLP Education - 04/02/18 1726    Education Details  things that may affect language ability from day to day    Person(s) Educated  Patient    Methods  Explanation    Comprehension  Verbalized understanding       SLP Short Term Goals - 04/02/18 1736      SLP SHORT TERM GOAL #1   Title  Pt will comprehend 3-4 phrase  description to ID or name object described 8/10x with occasional repetition and occasional min A    Time  4    Period  Weeks    Status  On-going      SLP SHORT TERM GOAL #2   Title  Pt will verbalize simple sentences with a given word with occasional min Aover 2 sessions.     Time  4    Period  Weeks    Status  On-going      SLP SHORT TERM GOAL #3   Title  Pt will name 10 items in a simple category with occasional min A over 2 sessions    Time  4    Period  Weeks    Status  On-going      SLP SHORT TERM GOAL #4   Title  Pt will ID aphasic errors in naming and sentence generation tasks, stop and attempt to self correct 6/10x iwth occasional min A    Time  5    Period  Weeks    Status  On-going      SLP SHORT TERM GOAL #5   Title  Pt will write simple phrase/sentence with given word, with occasional min A for error awareness    Time  5    Period  Weeks    Status  On-going       SLP Long Term Goals - 04/02/18 1736      SLP LONG TERM GOAL #1   Title  Pt will comprehend 8 minute simlple conversation with occasional min gestrual cues and repetition over 2 sessions    Time  10    Period  Weeks    Status  On-going      SLP LONG TERM GOAL #2   Title  Pt will ID aphasic errors/press of speech in simple conversation with occasional min A over 2 sessions    Time  10    Period  Weeks    Status  On-going      SLP LONG TERM GOAL #3  Title  Pt will comprehend 2-3 simple paragraph  passage with minimal extended time and 80% accuracy    Time  10    Period  Weeks    Status  On-going      SLP LONG TERM GOAL #4   Title  Pt will generate functional written tasks (lists, checks, schedules, short email/letter ) with error ID 3/5 errors and occasional min A.    Time  10    Period  Weeks    Status  On-going       Plan - 04/02/18 1735    Clinical Impression Statement  Pt continues to present with moderate aphasia affecting her receptive and expressive language. Conversation continues to exhibit signficant paraphasias and neologism today, affecting her ability to communicate with family and friends. She appears more frustrated today about her lack of ability to communicate. Continue skilled ST to maximize communication for improved independence and to reduce caregiver burden    Speech Therapy Frequency  2x / week    Duration  -- 12 weeks or 25 visits tota    Treatment/Interventions  SLP instruction and feedback;Environmental controls;Compensatory techniques;Cognitive reorganization;Functional tasks;Compensatory strategies;Patient/family education;Multimodal communcation approach;Internal/external aids;Language facilitation    Potential to Achieve Goals  Good    Potential Considerations  Severity of impairments       Patient will benefit from skilled therapeutic intervention in order to improve the following deficits and impairments:   Aphasia    Problem List Patient Active Problem List   Diagnosis Date Noted  . Non compliance with medical treatment 03/23/2018  . History of tobacco use disorder 01/01/2018  . Syncope and collapse 01/01/2018  . 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 Acute right hemiparesis (HCC) 12/12/2017  . History of completed stroke 12/12/2017  . Leukocytosis 12/12/2017  . Hypokalemia 12/12/2017  .  Hypotension   . 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 Poor compliance with CPAP treatment 03/04/2017  . OSA and COPD overlap syndrome (HCC) 03/04/2017  . Simple chronic bronchitis (HCC) 03/04/2017  . MCI (mild cognitive impairment) 11/27/2016  . Left sided lacunar infarction (HCC) 03/11/2014  . Cerebrovascular small vessel disease 03/11/2014  . Insomnia, idiopathic 12/20/2013  . Snoring 12/06/2013  . Essential hypertension, benign 10/26/2012  . Hyperlipidemia LDL goal <70 10/26/2012  . Polypharmacy 10/26/2012  . DM (diabetes mellitus) (HCC) 10/13/2011  . Tremor 10/13/2011  . GERD (gastroesophageal reflux disease) 10/13/2011  . Depression 10/13/2011    [facility] ,MS, CCC-SLP  04/02/2018, 5:36 PM  [facility] [address] [place], [place], [address] Phone: [phone]   Fax:  [phone]   Name: DEBHORA TITUS MRN: [id number] Date of Birth: May 15, 1961

## 2018-04-03 ENCOUNTER — Encounter

## 2018-04-05 NOTE — Progress Notes (Signed)
Electrophysiology Office Note   Date:  04/06/2018   ID:  Brittany [name], Brittany [name] 27-Jun-1961, MRN [id number]  PCP:  [name], MD  Cardiologist:   Primary Electrophysiologist:  Damien Cisar Jorja Loa, MD    Chief Complaint  Patient presents with  . Pacemaker Check    Sinus node dysfunction     History of Present Illness: Brittany [name] is a 57 y.o. female who is being seen today for the evaluation of cryptogenic stroke at the request of [name], MD. Presenting today for electrophysiology evaluation.  A history of hypertension, hyperlipidemia, prior CVA, and tobacco abuse.  She presented to the emergency room April 2019 with global aphasia.  Head CT showed acute left insular infarct with suspected M2 embolus.  She received TPA.  He had an episode of syncope which correlated with a 3-second pause on her Linq monitor as well as junctional rhythm post pause.  She had a Medtronic dual-chamber pacemaker implanted 01/01/2018 with explant of her loop monitor.  Today, denies symptoms of palpitations, chest pain, shortness of breath, orthopnea, PND, lower extremity edema, claudication, dizziness, presyncope, syncope, bleeding, or neurologic sequela. The patient is tolerating medications without difficulties.  Overall she is doing well.  She has no acute complaints.  She is continuing to heal up from her recent stroke.   Past Medical History:  Diagnosis Date  . Chronic lower back pain   . Depression   . Fibromyalgia   . High cholesterol   . Seizures (HCC) 11/2017   "day after she came home from hospital after having stroke" (01/01/2018)  . Stroke [facility]) 12/06/2017   "speech issues since" (01/01/2018)  . Type II diabetes mellitus (HCC)    Past Surgical History:  Procedure Laterality Date  . BACK SURGERY    . CHOLECYSTECTOMY    . INSERT / REPLACE / REMOVE PACEMAKER  01/01/2018  . IR CT HEAD LTD  12/06/2017  . IR PERCUTANEOUS ART THROMBECTOMY/INFUSION INTRACRANIAL INC DIAG ANGIO  12/06/2017    . LOOP RECORDER INSERTION N/A 12/12/2017   Procedure: LOOP RECORDER INSERTION;  Surgeon: Regan Lemming, MD;  [facility] INV[facility] CV [facility];  Service: Cardiovascular;  Laterality: N/A;  . LOOP RECORDER REMOVAL  01/01/2018  . LOOP RECORDER REMOVAL N/A 01/01/2018   Procedure: LOOP RECORDER REMOVAL;  Surgeon: Regan Lemming, MD;  [facility] INVAS[facility] CV LAB;  Service: Cardiovascular;  Laterality: N/A;  . PACEMAKER IMPLANT N/A 01/01/2018   Procedure: PACEMAKER IMPLANT;  Surgeon: Regan Lemming, MD;  [facility] CV [facility];  Service: Cardiovascular;  Laterality: N/A;  . RADIOLOGY WITH ANESTHESIA N/A 12/06/2017   Procedure: CODE STROKE;  Surgeon: Julieanne Cotton, MD;  Location: [facility] OR;  Service: Radiology;  Laterality: N/A;  . TEE WITHOUT CARDIOVERSION N/A 12/10/2017   Procedure: TRANSESOPHAGEAL ECHOCARDIOGRAM (TEE);  Surgeon: Jake Bathe, MD;  Location: [facility] ENDOSCOPY;  Service: Cardiovascular;  Laterality: N/A;  . THROMBECTOMY FEMORAL ARTERY Right 12/07/2017   Procedure: THROMBECTOMY RIGHT FEMORAL ARTERY;  Surgeon: Maeola Harman, MD;  Location: [facility] OR;  Service: Vascular;  Laterality: Right;  . TONSILLECTOMY       Current Outpatient Medications  Medication Sig Dispense Refill  . acetaminophen (TYLENOL) 500 MG tablet Take 1,000 mg by mouth every 6 (six) hours as needed for fever (pain).    Marland Kitchen albuterol (PROVENTIL) (2.5 MG/3ML) 0.083% nebulizer solution Take 3 mLs (2.5 mg total) by nebulization every 6 (six) hours as needed for wheezing or shortness of breath. 150  mL 1  . ARIPiprazole (ABILIFY) 2 MG tablet Take 1 tablet (2 mg total) by mouth daily. 30 tablet 4  . aspirin EC 81 MG tablet Take 81 mg by mouth daily.    Marland Kitchen. atorvastatin (LIPITOR) 20 MG tablet Take 1 tablet (20 mg total) by mouth daily. 90 tablet 3  . B Complex-Biotin-FA (MULTI-B COMPLEX) CAPS Take one a day ,B complex  multivitamin over the counter or use prenatal vitamin. 90 capsule 0  . clopidogrel  (PLAVIX) 75 MG tablet Take 1 tablet (75 mg total) by mouth daily. 30 tablet 11  . Continuous Blood Gluc Receiver (FREESTYLE LIBRE 14 DAY READER) DEVI 1 Units by Does not apply route 4 (four) times daily. 1 Device 0  . Continuous Blood Gluc Sensor (FREESTYLE LIBRE 14 DAY SENSOR) MISC Apply 1 Units topically every 14 (fourteen) days. 6 each 4  . insulin aspart (NOVOLOG FLEXPEN) 100 UNIT/ML FlexPen Check cbg prior to each meal.  Inject 5u if CBG 150-199, 10u if CBG 200-299, 15u if CBG >300 15 mL 11  . Insulin Detemir (LEVEMIR FLEXTOUCH) 100 UNIT/ML Pen Inject 40 Units into the skin daily at 10 pm. 45 mL 0  . Insulin Pen Needle (BD PEN NEEDLE NANO U/F) 32G X 4 MM MISC USE WITH Levemir PEN 100 each 11  . irbesartan (AVAPRO) 150 MG tablet Take 1 tablet (150 mg total) by mouth daily. 90 tablet 1  . levETIRAcetam (KEPPRA) 500 MG tablet TAKE 1 TABLET BY MOUTH TWICE A DAY 180 tablet 0  . sertraline (ZOLOFT) 100 MG tablet Take 2 tablets (200 mg total) by mouth at bedtime. 180 tablet 3   No current facility-administered medications for this visit.    Facility-Administered Medications Ordered in Other Visits  Medication Dose Route Frequency Provider Last Rate Last Dose  . gadopentetate dimeglumine (MAGNEVIST) injection 15 mL  15 mL Intravenous Once PRN Dohmeier, Porfirio Mylararmen, MD        Allergies:   Patient has no known allergies.   Social History:  The patient  reports that she quit smoking about 3 months ago. Her smoking use included cigarettes. She has a 38.00 pack-year smoking history. She has never used smokeless tobacco. She reports that she drinks alcohol. She reports that she does not use drugs.   Family History:  The patient's family history includes COPD in her maternal aunt; Cancer in her mother; Cancer (age of onset: 3062) in her maternal grandmother; Dementia in her father; Diabetes in her father and mother; Heart disease in her father and mother; Parkinsonism in her mother.    ROS:  Please see the  history of present illness.   Otherwise, review of systems is positive for type change, leg pain, depression, anxiety.   All other systems are reviewed and negative.   PHYSICAL EXAM: VS:  BP (!) 146/84   Pulse 92   Ht 5\' 3"  (1.6 m)   Wt 163 lb 6.4 oz (74.1 kg)   SpO2 97%   BMI 28.95 kg/m  , BMI Body mass index is 28.95 kg/m. GEN: Well nourished, well developed, in no acute distress  HEENT: normal  Neck: no JVD, carotid bruits, or masses Cardiac: RRR; no murmurs, rubs, or gallops,no edema  Respiratory:  clear to auscultation bilaterally, normal work of breathing GI: soft, nontender, nondistended, + BS MS: no deformity or atrophy  Skin: warm and dry, device site well healed Neuro:  Strength and sensation are intact Psych: euthymic mood, full affect  EKG:  EKG is  ordered today. Personal review of the ekg ordered shows sinus rhythm, rate 92  Personal review of the device interrogation today. Results in [facility]    Recent Labs: 07/30/2017: TSH 1.430 12/08/2017: Magnesium 1.8 01/01/2018: Hemoglobin 13.4; Platelets 210 02/26/2018: ALT 22; BUN 12; Creatinine, Ser 0.55; Potassium 4.4; Sodium 136    Lipid Panel     Component Value Date/Time   CHOL 161 12/07/2017 0443   CHOL 156 05/17/2017 1119   TRIG 75 12/07/2017 0443   HDL 40 (L) 12/07/2017 0443   HDL 55 05/17/2017 1119   CHOLHDL 4.0 12/07/2017 0443   VLDL 15 12/07/2017 0443   LDLCALC 106 (H) 12/07/2017 0443   LDLCALC 81 05/17/2017 1119   LDLDIRECT 61 12/03/2013 0852     Wt Readings from Last 3 Encounters:  04/06/18 163 lb 6.4 oz (74.1 kg)  03/23/18 159 lb (72.1 kg)  02/26/18 147 lb (66.7 kg)      Other studies Reviewed: Additional studies/ records that were reviewed today include: TTE 12/08/2017 Review of the above records today demonstrates:  - Left ventricle: The cavity size was normal. Wall thickness was   normal. Systolic function was normal. The estimated ejection   fraction was in the range of 55% to 60%. Wall  motion was normal;   there were no regional wall motion abnormalities. Features are   consistent with a pseudonormal left ventricular filling pattern,   with concomitant abnormal relaxation and increased filling   pressure (grade 2 diastolic dysfunction).   ASSESSMENT AND PLAN:  1.  Cryptogenic stroke: No atrial fibrillation seen on pacemaker.  No changes.  2.  Pauses with junctional rhythm and syncope: Found on link monitor.  Status post Linq removal and pacemaker implant 01/01/2018.  Device functioning appropriately.  No changes.  3.  Pretension: Blood pressure is mildly elevated today but has been normal in the recent past.  I have asked her to check her blood pressures at home.  We Sylvia Kondracki touch base with her on further readings.  Current medicines are reviewed at length with the patient today.   The patient does not have concerns regarding her medicines.  The following changes were made today: None  Labs/ tests ordered today include:  Orders Placed This Encounter  Procedures  . EKG 12-Lead     Disposition:   FU with Makalya Nave 12 months  Signed, Dawnna Gritz Jorja Loa, MD  04/06/2018 11:48 AM     [facility] [address] [place] [address] [phone] (office) [phone] (fax)

## 2018-04-06 ENCOUNTER — Ambulatory Visit (INDEPENDENT_AMBULATORY_CARE_PROVIDER_SITE_OTHER): Payer: BLUE CROSS/BLUE SHIELD | Admitting: Cardiology

## 2018-04-06 ENCOUNTER — Encounter: Payer: Self-pay | Admitting: Cardiology

## 2018-04-06 VITALS — BP 146/84 | HR 92 | Ht 63.0 in | Wt 163.4 lb

## 2018-04-06 DIAGNOSIS — I10 Essential (primary) hypertension: Secondary | ICD-10-CM | POA: Diagnosis not present

## 2018-04-06 DIAGNOSIS — R001 Bradycardia, unspecified: Secondary | ICD-10-CM

## 2018-04-06 DIAGNOSIS — I639 Cerebral infarction, unspecified: Secondary | ICD-10-CM

## 2018-04-06 NOTE — Patient Instructions (Signed)
Medication Instructions:  Your physician recommends that you continue on your current medications as directed. Please refer to the Current Medication list given to you today.  *If you need a refill on your cardiac medications before your next appointment, please call your pharmacy*  Labwork: None ordered  Testing/Procedures: None ordered  Follow-Up: Remote monitoring is used to monitor your Pacemaker or ICD from home. This monitoring reduces the number of office visits required to check your device to one time per year. It allows us to keep an eye on the functioning of your device to ensure it is working properly. You are scheduled for a device check from home on 07/06/2018. You may send your transmission at any time that day. If you have a wireless device, the transmission will be sent automatically. After your physician reviews your transmission, you will receive a postcard with your next transmission date.  Your physician wants you to follow-up in: 9 months with Dr. Elberta Fortisamnitz.  You will receive a reminder letter in the mail two months in advance. If you don't receive a letter, please call our office to schedule the follow-up appointment.  Thank you for choosing [facility]!!   Dory HornSherri Mavis Gravelle, RN [phone]  Any Other Special Instructions Will Be Listed Below (If Applicable).  Keep track of your blood pressure over the next week/two.  Nurse will call you to discuss readings.

## 2018-04-09 ENCOUNTER — Ambulatory Visit: Payer: BLUE CROSS/BLUE SHIELD | Attending: Family Medicine

## 2018-04-09 DIAGNOSIS — R4701 Aphasia: Secondary | ICD-10-CM | POA: Insufficient documentation

## 2018-04-09 NOTE — Therapy (Signed)
[name] [address] [place], [place], [address] Phone: [phone]   Fax:  [phone]  Speech Language Pathology Treatment  Patient Details  Name: Brittany [name] MRN: [id number] Date of Birth: [birth year] Referring Provider: Dr. Norberto Sorenson   Encounter Date: 04/09/2018  End of Session - 04/09/18 1539    Visit Number  7    Number of Visits  25    Date for SLP Re-Evaluation  06/02/18    Authorization Type  Keana has 30  visit limit, has had PT - will adjust visits accordingly. Darl Pikes says HHST used 3, leaving 27, however I believe she received more HHST per pt    Authorization Time Period  I initally scheduled for 2x a week for 8 weeks, will add on visists per POC as indicated    Authorization - Visit Number  6    Authorization - Number of Visits  27    SLP Start Time  1535    SLP Stop Time   1616    SLP Time Calculation (min)  41 min    Activity Tolerance  Patient tolerated treatment well       Past Medical History:  Diagnosis Date  . Chronic lower back pain   . Depression   . Fibromyalgia   . High cholesterol   . Seizures (HCC) 11/2017   "day after she came home from hospital after having stroke" (01/01/2018)  . Stroke [facility]) 12/06/2017   "speech issues since" (01/01/2018)  . Type II diabetes mellitus (HCC)     Past Surgical History:  Procedure Laterality Date  . BACK SURGERY    . CHOLECYSTECTOMY    . INSERT / REPLACE / REMOVE PACEMAKER  01/01/2018  . IR CT HEAD LTD  12/06/2017  . IR PERCUTANEOUS ART THROMBECTOMY/INFUSION INTRACRANIAL INC DIAG ANGIO  12/06/2017  . LOOP RECORDER INSERTION N/A 12/12/2017   Procedure: LOOP RECORDER INSERTION;  Surgeon: Regan Lemming, MD;  [facility] INVASIVE CV LAB;  Service: Cardiovascular;  Laterality: N/A;  . LOOP RECORDER REMOVAL  01/01/2018  . LOOP RECORDER REMOVAL N/A 01/01/2018   Procedure: LOOP RECORDER REMOVAL;  Surgeon: Regan Lemming, MD;  Location: [facility]  INVASIVE CV LAB;  Service: Cardiovascular;  Laterality: N/A;  . PACEMAKER IMPLANT N/A 01/01/2018   Procedure: PACEMAKER IMPLANT;  Surgeon: Regan Lemming, MD;  [facility]: [facility] INVASIVE CV LAB;  Service: Cardiovascular;  Laterality: N/A;  . RADIOLOGY WITH ANESTHESIA N/A 12/06/2017   Procedure: CODE STROKE;  Surgeon: Julieanne Cotton, MD;  Location: [facility] OR;  Service: Radiology;  Laterality: N/A;  . TEE WITHOUT CARDIOVERSION N/A 12/10/2017   Procedure: TRANSESOPHAGEAL ECHOCARDIOGRAM (TEE);  Surgeon: Jake Bathe, MD;  Location: [facility] ENDOSCOPY;  Service: Cardiovascular;  Laterality: N/A;  . THROMBECTOMY FEMORAL ARTERY Right 12/07/2017   Procedure: THROMBECTOMY RIGHT FEMORAL ARTERY;  Surgeon: Maeola Harman, MD;  Location: [facility] OR;  Service: Vascular;  Laterality: Right;  . TONSILLECTOMY      There were no vitals filed for this visit.  Subjective Assessment - 04/09/18 1549    Subjective  Pt enters with jargon adn real words    Currently in Pain?  No/denies            ADULT SLP TREATMENT - 04/09/18 1549      General Information   Behavior/Cognition  Alert;Cooperative;Pleasant mood      Treatment Provided   Treatment provided  Cognitive-Linquistic      Cognitive-Linquistic Treatment   Treatment focused on  Aphasia    Skilled Treatment  Simple conversation nonfunctional with extra time and SLP mod questioning cues; there was almost not enough real language to use questioning cues. SLP trained pt on using her phone pictures in order to communicate - SLP used pt's "write" function in her edit photo option to print names of all her grandkids on a picture of each grandchild. Asked pt's family to help pt complete more pictures Prior to writing names of grandkids on pictures pt success with naming grandkids was 80%, afterwards it was 100%.       Assessment / Recommendations / Plan   Plan  Continue with current plan of care      Progression Toward Goals   Progression toward goals   Progressing toward goals       SLP Education - 04/09/18 1622    Education Details  writing names of things/people on pictures on her phone    Person(s) Educated  Patient    Methods  Explanation;Demonstration    Comprehension  Verbalized understanding;Need further instruction       SLP Short Term Goals - 04/09/18 1622      SLP SHORT TERM GOAL #1   Title  Pt will comprehend 3-4 phrase  description to ID or name object described 8/10x with occasional repetition and occasional min A    Time  3    Period  Weeks    Status  On-going      SLP SHORT TERM GOAL #2   Title  Pt will give 2-3 word phrase for a given word or picture with occasional min-mod A over 2 sessions.     Time  3    Period  Weeks    Status  Revised      SLP SHORT TERM GOAL #3   Title  Pt will name 5 items in a simple category with occasional min-mod A over 2 sessions    Time  3    Period  Weeks    Status  On-going      SLP SHORT TERM GOAL #4   Title  Pt will ID aphasic errors in naming and sentence generation tasks, stop and attempt to self correct 5/10x iwth occasional min A    Time  4    Period  Weeks    Status  On-going      SLP SHORT TERM GOAL #5   Title  Pt will write a word to name an object or picture, with occasional min A for error awareness    Time  4    Period  Weeks    Status  Revised       SLP Long Term Goals - 04/09/18 1625      SLP LONG TERM GOAL #1   Title  Pt will comprehend 8 minute simlple conversation with occasional min gestrual cues and repetition over 2 sessions    Time  9    Period  Weeks    Status  On-going      SLP LONG TERM GOAL #2   Title  Pt will ID aphasic errors/press of speech in simple conversation with occasional min A over 2 sessions    Time  9    Period  Weeks    Status  On-going      SLP LONG TERM GOAL #3   Title  Pt will comprehend 2-3 simple paragraph passage with minimal extended time and 80% accuracy    Time  9    Period  Weeks    Status  On-going       SLP LONG TERM GOAL #4   Title  Pt will generate functional written tasks (lists, checks, schedules, short email/letter ) with error ID 3/5 errors and occasional min A.    Time  9    Period  Weeks    Status  On-going         Patient will benefit from skilled therapeutic intervention in order to improve the following deficits and impairments:   Aphasia    Problem List Patient Active Problem List   Diagnosis Date Noted  . Non compliance with medical treatment 03/23/2018  . History of tobacco use disorder 01/01/2018  . Syncope and collapse 01/01/2018  . 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 Acute right hemiparesis (HCC) 12/12/2017  . History of completed stroke 12/12/2017  . Leukocytosis 12/12/2017  . Hypokalemia 12/12/2017  . Hypotension   . 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 Poor compliance with CPAP treatment 03/04/2017  . OSA and COPD overlap syndrome (HCC) 03/04/2017  . Simple chronic bronchitis (HCC) 03/04/2017  . MCI (mild cognitive impairment) 11/27/2016  . Left sided lacunar infarction (HCC) 03/11/2014  . Cerebrovascular small vessel disease 03/11/2014  . Insomnia, idiopathic 12/20/2013  . Snoring 12/06/2013  . Essential hypertension, benign 10/26/2012  . Hyperlipidemia LDL goal <70 10/26/2012  . Polypharmacy 10/26/2012  . DM (diabetes mellitus) (HCC) 10/13/2011  . Tremor 10/13/2011  . GERD (gastroesophageal reflux disease) 10/13/2011  . Depression 10/13/2011    [facility] ,MS, CCC-SLP  04/09/2018, 4:25 PM  [facility] [address] [place], [place], [address] Phone: [phone]   Fax:  [phone]   Name: Brittany [name] MRN: [id number] Date of Birth: [birth year]

## 2018-04-09 NOTE — Patient Instructions (Signed)
  Work with Angelique Blonderenise to take pictures of common places she goes, people she sees often, family she sees regularly, etc. Then print the names of those things using the edit function for the picture. This way she can read the word instead of having to draw the word from her memory. We have already done this for Denman GeorgeAubrey, Adi, [place], and [place]. I hope you can then put a folder on her phone for all of these pictures that have captions so she can quickly access them when needed.

## 2018-04-10 ENCOUNTER — Ambulatory Visit: Payer: BLUE CROSS/BLUE SHIELD

## 2018-04-10 ENCOUNTER — Other Ambulatory Visit: Payer: Self-pay | Admitting: Family Medicine

## 2018-04-10 DIAGNOSIS — R4701 Aphasia: Secondary | ICD-10-CM

## 2018-04-10 NOTE — Patient Instructions (Signed)
Use the [email or web address] on your tablet to do some therapy tasks.   Download the talkpath app on your iPhone.   These would be good things for you to work on when you are at home.

## 2018-04-10 NOTE — Therapy (Signed)
[facility] [address] [place], [place], [address] Phone: [phone]   Fax:  [phone]  Speech Language Pathology Treatment  Patient Details  Name: Brittany [name] MRN: [id number] Date of Birth: 09-08-1961 Referring Provider: Dr. Norberto Sorenson   Encounter Date: 04/10/2018  End of Session - 04/10/18 1617    Visit Number  8    Number of Visits  25    Date for SLP Re-Evaluation  06/02/18    Authorization Type  Lolita has 30  visit limit, has had PT - will adjust visits accordingly. Darl Pikes says HHST used 3, leaving 27, however I believe she received more HHST per pt    Authorization - Visit Number  7    Authorization - Number of Visits  27    SLP Start Time  1449    SLP Stop Time   1530    SLP Time Calculation (min)  41 min    Activity Tolerance  Patient tolerated treatment well       Past Medical History:  Diagnosis Date  . Chronic lower back pain   . Depression   . Fibromyalgia   . High cholesterol   . Seizures (HCC) 11/2017   "day after she came home from hospital after having stroke" (01/01/2018)  . Stroke [facility]) 12/06/2017   "speech issues since" (01/01/2018)  . Type II diabetes mellitus (HCC)     Past Surgical History:  Procedure Laterality Date  . BACK SURGERY    . CHOLECYSTECTOMY    . INSERT / REPLACE / REMOVE PACEMAKER  01/01/2018  . IR CT HEAD LTD  12/06/2017  . IR PERCUTANEOUS ART THROMBECTOMY/INFUSION INTRACRANIAL INC DIAG ANGIO  12/06/2017  . LOOP RECORDER INSERTION N/A 12/12/2017   Procedure: LOOP RECORDER INSERTION;  Surgeon: Regan Lemming, MD;  [facility] INVASIVE CV LAB;  Service: Cardiovascular;  Laterality: N/A;  . LOOP RECORDER REMOVAL  01/01/2018  . LOOP RECORDER REMOVAL N/A 01/01/2018   Procedure: LOOP RECORDER REMOVAL;  Surgeon: Regan Lemming, MD;  [facility]: [facility] INVASIVE CV LAB;  Service: Cardiovascular;  Laterality: N/A;  . PACEMAKER IMPLANT N/A 01/01/2018   Procedure: PACEMAKER  IMPLANT;  Surgeon: Regan Lemming, MD;  [facility]: [facility] INVASIVE CV LAB;  Service: Cardiovascular;  Laterality: N/A;  . RADIOLOGY WITH ANESTHESIA N/A 12/06/2017   Procedure: CODE STROKE;  Surgeon: Julieanne Cotton, MD;  Location: [facility] OR;  Service: Radiology;  Laterality: N/A;  . TEE WITHOUT CARDIOVERSION N/A 12/10/2017   Procedure: TRANSESOPHAGEAL ECHOCARDIOGRAM (TEE);  Surgeon: Jake Bathe, MD;  Location: [facility] ENDOSCOPY;  Service: Cardiovascular;  Laterality: N/A;  . THROMBECTOMY FEMORAL ARTERY Right 12/07/2017   Procedure: THROMBECTOMY RIGHT FEMORAL ARTERY;  Surgeon: Maeola Harman, MD;  Location: [facility] OR;  Service: Vascular;  Laterality: Right;  . TONSILLECTOMY      There were no vitals filed for this visit.  Subjective Assessment - 04/10/18 1459    Patient is accompained by:  Family member son brandon arrived with pt    Currently in Pain?  No/denies            ADULT SLP TREATMENT - 04/10/18 1459      General Information   Behavior/Cognition  Alert;Cooperative;Pleasant mood      Treatment Provided   Treatment provided  Cognitive-Linquistic      Cognitive-Linquistic Treatment   Treatment focused on  Aphasia    Skilled Treatment  Pt arrived with her son. Pt showed SLP 6 more pictures with writing on  them that were in a folder on pt's desktop. SLP had pt go through pictures and she provided names for people and for toys 100% success on first attempt, and 2 more times, randomly, as well.  SLP then had pt try Lingraphica and tell him which restaurants she liked and pt named 5 without errors. SLP did the same with stores and pt named 4 with 100% success. SLP educated pt/son that this device may be helpful for pt moreso in the future than currently as SLPs would like to work with pt using traditional ST before assessing again whether or not pt may require a device for the future. Pt indicated she thought her speech was not going to return to baseline and SLP agreed with pt.        Assessment / Recommendations / Plan   Plan  Continue with current plan of care      Progression Toward Goals   Progression toward goals  Progressing toward goals       SLP Education - 04/10/18 1617    Education Details  [employer] (SGD), [email or web address], talk path app    Person(s) Educated  Patient;Child(ren)    Methods  Explanation;Handout    Comprehension  Verbalized understanding       SLP Short Term Goals - 04/10/18 1619      SLP SHORT TERM GOAL #1   Title  Pt will comprehend 3-4 phrase  description to ID or name object described 8/10x with occasional repetition and occasional min A    Time  3    Period  Weeks    Status  On-going      SLP SHORT TERM GOAL #2   Title  Pt will give 2-3 word phrase for a given word or picture with occasional min-mod A over 2 sessions.     Time  3    Period  Weeks    Status  Revised      SLP SHORT TERM GOAL #3   Title  Pt will name 5 items in a simple category with occasional min-mod A over 2 sessions    Time  3    Period  Weeks    Status  On-going      SLP SHORT TERM GOAL #4   Title  Pt will ID aphasic errors in naming and sentence generation tasks, stop and attempt to self correct 5/10x iwth occasional min A    Time  4    Period  Weeks    Status  On-going      SLP SHORT TERM GOAL #5   Title  Pt will write a word to name an object or picture, with occasional min A for error awareness    Time  4    Period  Weeks    Status  Revised       SLP Long Term Goals - 04/10/18 1619      SLP LONG TERM GOAL #1   Title  Pt will comprehend 8 minute simlple conversation with occasional min gestrual cues and repetition over 2 sessions    Time  9    Period  Weeks    Status  On-going      SLP LONG TERM GOAL #2   Title  Pt will ID aphasic errors/press of speech in simple conversation with occasional min A over 2 sessions    Time  9    Period  Weeks    Status  On-going      SLP LONG TERM  GOAL #3   Title  Pt will comprehend 2-3  simple paragraph passage with minimal extended time and 80% accuracy    Time  9    Period  Weeks    Status  On-going      SLP LONG TERM GOAL #4   Title  Pt will generate functional written tasks (lists, checks, schedules, short email/letter ) with error ID 3/5 errors and occasional min A.    Time  9    Period  Weeks    Status  On-going       Plan - 04/10/18 1617    Clinical Impression Statement  Pt continues to present with moderate aphasia affecting her receptive and expressive language. Conversation continues to exhibit signficant paraphasias and neologism today, affecting her ability to communicate with family and friends. Her performance with written words printed on pictures on her phone or with Lingraph[employer] improved her expressive language. Continue skilled ST to maximize communication for improved independence and to reduce caregiver burden    Speech Therapy Frequency  2x / week    Duration  -- 12 weeks or 25 visits tota    Treatment/Interventions  SLP instruction and feedback;Environmental controls;Compensatory techniques;Cognitive reorganization;Functional tasks;Compensatory strategies;Patient/family education;Multimodal communcation approach;Internal/external aids;Language facilitation    Potential to Achieve Goals  Good    Potential Considerations  Severity of impairments       Patient will benefit from skilled therapeutic intervention in order to improve the following deficits and impairments:   Aphasia    Problem List Patient Active Problem List   Diagnosis Date Noted  . Non compliance with medical treatment 03/23/2018  . History of tobacco use disorder 01/01/2018  . Syncope and collapse 01/01/2018  . 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 Acute right hemiparesis (HCC) 12/12/2017  . History of completed stroke 12/12/2017  .  Leukocytosis 12/12/2017  . Hypokalemia 12/12/2017  . Hypotension   . 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 Poor compliance with CPAP treatment 03/04/2017  . OSA and COPD overlap syndrome (HCC) 03/04/2017  . Simple chronic bronchitis (HCC) 03/04/2017  . MCI (mild cognitive impairment) 11/27/2016  . Left sided lacunar infarction (HCC) 03/11/2014  . Cerebrovascular small vessel disease 03/11/2014  . Insomnia, idiopathic 12/20/2013  . Snoring 12/06/2013  . Essential hypertension, benign 10/26/2012  . Hyperlipidemia LDL goal <70 10/26/2012  . Polypharmacy 10/26/2012  . DM (diabetes mellitus) (HCC) 10/13/2011  . Tremor 10/13/2011  . GERD (gastroesophageal reflux disease) 10/13/2011  . Depression 10/13/2011    [facility],CARL ,MS, CCC-SLP  04/10/2018, 4:19 PM  [place] [address] [place], [place], [address] Phone: [phone]   Fax:  [phone]   Name: Jenne CampusDenise W Molyneux MRN: [id number] Date of Birth: April 06, 1961

## 2018-04-13 ENCOUNTER — Other Ambulatory Visit: Payer: Self-pay | Admitting: Family Medicine

## 2018-04-14 ENCOUNTER — Telehealth: Payer: Self-pay | Admitting: Family Medicine

## 2018-04-14 ENCOUNTER — Ambulatory Visit: Payer: BLUE CROSS/BLUE SHIELD

## 2018-04-14 DIAGNOSIS — R4701 Aphasia: Secondary | ICD-10-CM

## 2018-04-14 NOTE — Patient Instructions (Signed)
Complete a few modules with Constant therapy on the laptop and see if it works better.

## 2018-04-14 NOTE — Telephone Encounter (Signed)
Rx sent to pharmacy today for 30 units by Dr Clelia CroftShaw

## 2018-04-14 NOTE — Therapy (Signed)
[facility] [address] [place], [place], [address] Phone: [phone]   Fax:  [phone]  Speech Language Pathology Treatment  Patient Details  Name: Brittany [name] MRN: [id number] Date of Birth: 11/19/1960 Referring Provider: Dr. Norberto Sorenson   Encounter Date: 04/14/2018  End of Session - 04/14/18 1136    Visit Number  9    Number of Visits  25    Date for SLP Re-Evaluation  06/02/18    Authorization - Visit Number  8    Authorization - Number of Visits  27    SLP Start Time  1324    SLP Stop Time   0934    SLP Time Calculation (min)  42 min    Activity Tolerance  Patient tolerated treatment well       Past Medical History:  Diagnosis Date  . Chronic lower back pain   . Depression   . Fibromyalgia   . High cholesterol   . Seizures (HCC) 11/2017   "day after she came home from hospital after having stroke" (01/01/2018)  . Stroke [facility]) 12/06/2017   "speech issues since" (01/01/2018)  . Type II diabetes mellitus (HCC)     Past Surgical History:  Procedure Laterality Date  . BACK SURGERY    . CHOLECYSTECTOMY    . INSERT / REPLACE / REMOVE PACEMAKER  01/01/2018  . IR CT HEAD LTD  12/06/2017  . IR PERCUTANEOUS ART THROMBECTOMY/INFUSION INTRACRANIAL INC DIAG ANGIO  12/06/2017  . LOOP RECORDER INSERTION N/A 12/12/2017   Procedure: LOOP RECORDER INSERTION;  Surgeon: Regan Lemming, MD;  Location: [facility] INVASIVE CV LAB;  Service: Cardiovascular;  Laterality: N/A;  . LOOP RECORDER REMOVAL  01/01/2018  . LOOP RECORDER REMOVAL N/A 01/01/2018   Procedure: LOOP RECORDER REMOVAL;  Surgeon: Regan Lemming, MD;  Location: [facility] INVASIVE CV LAB;  Service: Cardiovascular;  Laterality: N/A;  . PACEMAKER IMPLANT N/A 01/01/2018   Procedure: PACEMAKER IMPLANT;  Surgeon: Regan Lemming, MD;  Location: [facility] INVASIVE CV LAB;  Service: Cardiovascular;  Laterality: N/A;  . RADIOLOGY WITH ANESTHESIA N/A 12/06/2017   Procedure:  CODE STROKE;  Surgeon: Julieanne Cotton, MD;  Location: [facility] OR;  Service: Radiology;  Laterality: N/A;  . TEE WITHOUT CARDIOVERSION N/A 12/10/2017   Procedure: TRANSESOPHAGEAL ECHOCARDIOGRAM (TEE);  Surgeon: Jake Bathe, MD;  Location: [facility] ENDOSCOPY;  Service: Cardiovascular;  Laterality: N/A;  . THROMBECTOMY FEMORAL ARTERY Right 12/07/2017   Procedure: THROMBECTOMY RIGHT FEMORAL ARTERY;  Surgeon: Maeola Harman, MD;  Location: [facility] OR;  Service: Vascular;  Laterality: Right;  . TONSILLECTOMY      There were no vitals filed for this visit.  Subjective Assessment - 04/14/18 0855    Subjective  "Got a headache today."    Patient is accompained by:  Family member brandon    Currently in Pain?  Yes    Pain Score  5     Pain Location  Head    Pain Descriptors / Indicators  Headache    Pain Type  Acute pain    Pain Onset  Today    Pain Frequency  Constant            ADULT SLP TREATMENT - 04/14/18 0856      General Information   Behavior/Cognition  Alert;Cooperative;Pleasant mood      Treatment Provided   Treatment provided  Cognitive-Linquistic      Cognitive-Linquistic Treatment   Treatment focused on  Aphasia    Skilled Treatment  Pt reports a legal issue in her life right now that is causing her more stress - which has made her language worse. Pt was successful at conveying her message to SLP about custody hearing. Pt 60% successful with giving sentnece re: picture, incr'd to 80% with mod A. Pt side-conversation requires usual SLP A and is not always successful. Pt reports she tried to do some tasks on Constant Therapy but the app was not working correctly on the tablet.       Assessment / Recommendations / Plan   Plan  Continue with current plan of care      Progression Toward Goals   Progression toward goals  Progressing toward goals         SLP Short Term Goals - 04/14/18 1137      SLP SHORT TERM GOAL #1   Title  Pt will comprehend 3-4 phrase  description  to ID or name object described 8/10x with occasional repetition and occasional min A    Time  2    Period  Weeks    Status  On-going      SLP SHORT TERM GOAL #2   Title  Pt will give 2-3 word phrase for a given word or picture with occasional min-mod A over 2 sessions.     Time  2    Period  Weeks    Status  Revised      SLP SHORT TERM GOAL #3   Title  Pt will name 5 items in a simple category with occasional min-mod A over 2 sessions    Time  2    Period  Weeks    Status  On-going      SLP SHORT TERM GOAL #4   Title  Pt will ID aphasic errors in naming and sentence generation tasks, stop and attempt to self correct 5/10x iwth occasional min A    Time  3    Period  Weeks    Status  On-going      SLP SHORT TERM GOAL #5   Title  Pt will write a word to name an object or picture, with occasional min A for error awareness    Time  3    Period  Weeks    Status  Revised       SLP Long Term Goals - 04/14/18 1137      SLP LONG TERM GOAL #1   Title  Pt will comprehend 8 minute simlple conversation with occasional min gestrual cues and repetition over 2 sessions    Time  8    Period  Weeks    Status  On-going      SLP LONG TERM GOAL #2   Title  Pt will ID aphasic errors/press of speech in simple conversation with occasional min A over 2 sessions    Time  8    Period  Weeks    Status  On-going      SLP LONG TERM GOAL #3   Title  Pt will comprehend 2-3 simple paragraph passage with minimal extended time and 80% accuracy    Time  8    Period  Weeks    Status  On-going      SLP LONG TERM GOAL #4   Title  Pt will generate functional written tasks (lists, checks, schedules, short email/letter ) with error ID 3/5 errors and occasional min A.    Time  8    Period  Weeks    Status  On-going       Plan - 04/14/18 1137    Clinical Impression Statement  Pt continues to present with moderate aphasia affecting her receptive and expressive language. Conversation continues to  exhibit signficant paraphasias and neologism today, affecting her ability to communicate with family and friends. Her performance with written words printed on pictures on her phone or with [employer] improved her expressive language. Continue skilled ST to maximize communication for improved independence and to reduce caregiver burden    Speech Therapy Frequency  2x / week    Duration  -- 12 weeks or 25 visits tota    Treatment/Interventions  SLP instruction and feedback;Environmental controls;Compensatory techniques;Cognitive reorganization;Functional tasks;Compensatory strategies;Patient/family education;Multimodal communcation approach;Internal/external aids;Language facilitation    Potential to Achieve Goals  Good    Potential Considerations  Severity of impairments       Patient will benefit from skilled therapeutic intervention in order to improve the following deficits and impairments:   Aphasia    Problem List Patient Active Problem List   Diagnosis Date Noted  . Non compliance with medical treatment 03/23/2018  . History of tobacco use disorder 01/01/2018  . Syncope and collapse 01/01/2018  . 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 Acute right hemiparesis (HCC) 12/12/2017  . History of completed stroke 12/12/2017  . Leukocytosis 12/12/2017  . Hypokalemia 12/12/2017  . Hypotension   . 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 Poor compliance with CPAP treatment 03/04/2017  . OSA and COPD overlap syndrome (HCC) 03/04/2017  . Simple chronic bronchitis (HCC) 03/04/2017  . MCI (mild cognitive impairment) 11/27/2016  . Left sided lacunar infarction (HCC) 03/11/2014  . Cerebrovascular small vessel disease 03/11/2014  . Insomnia, idiopathic 12/20/2013  . Snoring 12/06/2013  . Essential hypertension, benign 10/26/2012  .  Hyperlipidemia LDL goal <70 10/26/2012  . Polypharmacy 10/26/2012  . DM (diabetes mellitus) (HCC) 10/13/2011  . Tremor 10/13/2011  . GERD (gastroesophageal reflux disease) 10/13/2011  . Depression 10/13/2011    [facility] Name HospitalCHINKE,Christabell Loseke ,MS, CCC-SLP  04/14/2018, 11:38 AM  [facility] [address] [place], [place], [address] Phone: [phone]   Fax:  [phone]   Name: Jenne CampusDenise W Buonocore MRN: [id number] Date of Birth: 12/29/1960

## 2018-04-14 NOTE — Telephone Encounter (Signed)
Contacted pt regarding refill request for levemir insulin; discussed with pt that the prescription was changed on 02/26/18 by Dr Norberto SorensonEva [name] to 40 units at 10PM; she says that she had been taking the 30 unit dose; will route to office for notification of this encounter.

## 2018-04-14 NOTE — Progress Notes (Signed)
[facility] [address] [place]. [place] [address] [phone]       OFFICE FOLLOW-UP NOTE  [name] Date of Birth:  09-07-1961 Medical Record Number:  [id number]   Chief Complaint  Patient presents with  . Follow-up    RM 9. Last seen 03/23/18 by Dr. Vickey Huger for sleep apnea. Last saw Dr. Pearlean Brownie 01/13/18. Still having problems with aphasia. C/o numbness in arms and right leg. Going through [facility] rehab [facility] with [facility]. Has appt today.     HPI: Brittany [name] is a 57 year old Caucasian lady seen today for first office follow-up visit following hospital admission for stroke in April 2019. She is accompanied by her son-in-law. History is obtained from them and review of electronic medical records. I have personally reviewed imaging films. Brittany [name] Sowersis a 57 y.o.femalewith a PMH of HTN, HLD, DM, Hx of prior CVA and tobacco abuse who presents to the ED as a code stroke with reports of acute onset Global Aphasia, last seen normal at approximately 09:15 this morning 12/06/2017. CT Head code stroke reveals Acute Left Insula Infarct with suspected embolus in distal Left M2. No contraindications were identified in patients history and IV tPA was administered. IR was notified and patient family was briefed by Dr Laurence Slate. Risks and benefits of IR procedure was explained and family wants to proceed. NIH Stroke Scale: 10. Modified Rankin:Score=1. Patient was administered IV TPA and   She subsequently underwent mechanical thrombectomy of left M2 occlusion with TICI 2b reperfusion.gastritis sig echo was unremarkable. Transesophageal echo showed no Source of embolism. LDL cholesterol 81 mg percent. Hemoglobin A1c was 11.5. She was on aspirin prior to admission which was changed to Plavix. Patient did well and was subsequently discharged home with home physical occupational speech therapy. Patient subsequently had symptomatic bradycardia and was seen by Dr. Elberta Fortis  electrophysiologist and underwent pacemaker insertion with removal of the loop recorder on 01/02/18. She has not yet found A. Fib. The patient's Plavix and Lipitor have been inadvertently discontinued following the admission for pacemaker. She states her sugars under good control. She is still on Keppra which was started when she went back on 12/13/17 to the ER with a witnessed seizure. She's had no further breakthrough seizures. She still has residual aphasia but it is improving. Her home speech therapy is recommending outpatient speech therapy now. Physical and occupational therapy have discharged her. She is still out of work and wants FMLA paperwork filled out.  Interval History: Patient is being seen today for follow-up and is accompanied by her two sons.  She continues to have expressive aphasia > receptive aphasia but she continues to participate in speech therapy at neuro rehab clinic.  She states some days are better than others but overall has been improving.  She does notice increased difficulty with expressive aphasia with increased stress or fatigue.  She also has been having difficulty with processing information.  She continues to take aspirin and Plavix without side effects of bleeding or bruising.  Continues to take Lipitor without side effects myalgias.  Blood pressure today satisfactory 125/70.  Patient does not monitor this at home and this was highly recommended.  She does monitor glucose levels at home which have been fluctuating but she does have an appointment scheduled on 05/04/2018 for her first visit with endocrinologist.  She continues to take Keppra 500 mg twice daily without recent seizure activity.  Patient has not returned to work at this time working at [employer] due to continued global aphasia.  Patient deals greatly with the public and on the phone with collections department and patient continues to have difficulty with expressive aphasia especially under  stressful situations.  She is currently living at home with her husband, son and father who has Alzheimer's.  Patient states she was told by her cardiologist that she can start driving at this time but educated patient on [place] law stating that she is unable to drive for 6 months after seizure activity.  Patient verbalized understanding.  Patient has been going through stressful situation at home with her son, daughter-in-law and her children as far as divorce and custody battle.  Since this is been occurring, patient has had increase in fluctuation with her expressive aphasia.  Her receptive aphasia continues to improve but does misunderstand words at times and continues to have issues with processing information.  Denies new or worsening stroke/TIA symptoms.    ROS:   14 system review of systems is positive for fatigue, excessive thirst, bruise easily, memory loss, numbness, speech difficulty, tremors, depression and nervous/anxious all other systems negative PMH:  Past Medical History:  Diagnosis Date  . Chronic lower back pain   . Depression   . Fibromyalgia   . High cholesterol   . Seizures (HCC) 11/2017   "day after she came home from hospital after having stroke" (01/01/2018)  . Stroke [facility]) 12/06/2017   "speech issues since" (01/01/2018)  . Type II diabetes mellitus (HCC)     Social History:  Social History   Socioeconomic History  . Marital status: Married    Spouse name: [name]  . Number of children: 2  . Years of education: 47  . Highest education level: Not on file  Occupational History  . Not on file  Social Needs  . Financial resource strain: Not on file  . Food insecurity:    Worry: Not on file    Inability: Not on file  . Transportation needs:    Medical: Not on file    Non-medical: Not on file  Tobacco Use  . Smoking status: Former Smoker    Packs/day: 1.00    Years: 38.00    Pack years: 38.00    Types: Cigarettes    Last attempt to quit: 12/06/2017    Years  since quitting: 0.3  . Smokeless tobacco: Never Used  Substance and Sexual Activity  . Alcohol use: Yes    Comment: 01/01/2018 "nothing since stroke 11/2017"  . Drug use: No  . Sexual activity: Not Currently  Lifestyle  . Physical activity:    Days per week: Not on file    Minutes per session: Not on file  . Stress: Not on file  Relationships  . Social connections:    Talks on phone: Not on file    Gets together: Not on file    Attends religious service: Not on file    Active member of club or organization: Not on file    Attends meetings of clubs or organizations: Not on file    Relationship status: Not on file  . Intimate partner violence:    Fear of current or ex partner: Not on file    Emotionally abused: Not on file    Physically abused: Not on file    Forced sexual activity: Not on file  Other Topics Concern  . Not on file  Social History Narrative   Patient is married ([name]) and lives at home with her family  Patient has two children.   Patient works at [employer].   Patient has a college education.   Patient is right-handed.   Patient drinks 3 cups of coffee M-F.       Medications:   Current Outpatient Medications on File Prior to Visit  Medication Sig Dispense Refill  . acetaminophen (TYLENOL) 500 MG tablet Take 1,000 mg by mouth every 6 (six) hours as needed for fever (pain).    Marland Kitchen albuterol (PROVENTIL) (2.5 MG/3ML) 0.083% nebulizer solution Take 3 mLs (2.5 mg total) by nebulization every 6 (six) hours as needed for wheezing or shortness of breath. 150 mL 1  . ARIPiprazole (ABILIFY) 2 MG tablet Take 1 tablet (2 mg total) by mouth daily. 30 tablet 4  . aspirin EC 81 MG tablet Take 81 mg by mouth daily.    Marland Kitchen atorvastatin (LIPITOR) 20 MG tablet Take 1 tablet (20 mg total) by mouth daily. 90 tablet 3  . B Complex-Biotin-FA (MULTI-B COMPLEX) CAPS Take one a day ,B complex  multivitamin over the counter or use prenatal vitamin. 90 capsule 0  . clopidogrel  (PLAVIX) 75 MG tablet Take 1 tablet (75 mg total) by mouth daily. 30 tablet 11  . Continuous Blood Gluc Receiver (FREESTYLE LIBRE 14 DAY READER) DEVI 1 Units by Does not apply route 4 (four) times daily. 1 Device 0  . Continuous Blood Gluc Sensor (FREESTYLE LIBRE 14 DAY SENSOR) MISC Apply 1 Units topically every 14 (fourteen) days. 6 each 4  . insulin aspart (NOVOLOG FLEXPEN) 100 UNIT/ML FlexPen Check cbg prior to each meal.  Inject 5u if CBG 150-199, 10u if CBG 200-299, 15u if CBG >300 15 mL 11  . Insulin Detemir (LEVEMIR FLEXTOUCH) 100 UNIT/ML Pen Inject 40 Units into the skin daily at 10 pm. 45 mL 0  . Insulin Pen Needle (BD PEN NEEDLE NANO U/F) 32G X 4 MM MISC USE WITH Levemir PEN 100 each 11  . irbesartan (AVAPRO) 150 MG tablet Take 1 tablet (150 mg total) by mouth daily. 90 tablet 1  . LEVEMIR FLEXTOUCH 100 UNIT/ML Pen INJECT 30 UNITS UNDER THE SKIN DAILY AT 10 PM 30 mL 0  . sertraline (ZOLOFT) 100 MG tablet Take 2 tablets (200 mg total) by mouth at bedtime. 180 tablet 3   Current Facility-Administered Medications on File Prior to Visit  Medication Dose Route Frequency Provider Last Rate Last Dose  . gadopentetate dimeglumine (MAGNEVIST) injection 15 mL  15 mL Intravenous Once PRN Dohmeier, Porfirio Mylar, MD        Allergies:  No Known Allergies  Physical Exam General:frail middle-aged lady seated, in no evident distress Head: head normocephalic and atraumatic.  Neck: supple with no carotid or supraclavicular bruits Cardiovascular: regular rate and rhythm, no murmurs Musculoskeletal: no deformity Skin:  no rash/petichiae Vascular:  Normal pulses all extremities Vitals:   04/15/18 1225  BP: 125/70  Pulse: 88   Neurologic Exam Mental Status: Awake and fully alert.  Mild to moderate expressive aphasia > receptive aphasia.  Oriented to place and time. Recent and remote memory intact. Attention span, concentration and fund of knowledge appropriate. Mood and affect appropriate. Slightly  impaired comprehension. No dysarthria Cranial Nerves: Fundoscopic exam reveals sharp disc margins. Pupils equal, briskly reactive to light. Extraocular movements full without nystagmus. Visual fields full to confrontation. Hearing intact. Facial sensation intact. Mild right nasolabial fold asymmetry., tongue, palate moves normally and symmetrically.  Motor: Normal bulk and tone. Normal strength in all tested extremity muscles. Sensory.: intact  to touch ,pinprick .position and vibratory sensation.  Coordination: Rapid alternating movements normal in all extremities. Finger-to-nose and heel-to-shin performed accurately bilaterally. Gait and Station: Arises from chair without difficulty. Stance is normal. Gait demonstrates normal stride length and balance . Able to heel, toe and tandem walk without difficulty.  Reflexes: 1+ and symmetric. Toes downgoing.     ASSESSMENT: 2050-57 year old lady with aphasia secondary to left MCA infarct in April 2019 of cryptogenic etiology treated with IV TPA followed by mechanical thrombectomy with good recanalization. She also had symptomatic seizure after discharge and still has residual aphasia.  Patient returns today for follow-up appointment but continues to have residual expressive aphasia > receptive aphasia.  PLAN: -Continue aspirin 81 mg daily and clopidogrel 75 mg daily  and Lipitor for secondary stroke prevention -Continue Keppra 500 mg twice daily for seizure prevention -Per [place] law, patient should not drive for 6 months after seizure activity -patient verbalized understanding -F/u with PCP regarding your HLD, DM and HTN management -f/u with endocrinologist as scheduled - Patient not released to return to work at this time due to continued expressive > receptive aphasia and delayed processing -continue to monitor BP at home -Highly advised to undergo stress reduction techniques due to possible delay in recovery of stroke deficits and overall general health.   Stress reduction techniques provided with AVS paperwork -Advised to continue to stay active and maintain a healthy diet -Continue speech therapy and was advised that she needs additional orders to call us in order to place -Maintain strict control of hypertension with blood pressure goal below 130/90, diabetes with hemoglobin A1c goal below 6.5% and cholesterol with LDL cholesterol (bad cholesterol) goal below 70 mg/dL. I also advised the patient to eat a healthy diet with plenty of whole grains, cereals, fruits and vegetables, exercise regularly and maintain ideal body weight.  Follow up in 6 months or call earlier if needed  Greater than 50% of time during this 25 minute visit was spent on counseling,explanation of diagnosis, planning of further management, discussion with patient and family and coordination of care  George HughJessica Elbie Statzer, [facility] CenterGNP-BC  [facility] [address] [place], [place] [address]  Phone [phone] Fax [phone] Note: This document was prepared with digital dictation and possible smart phrase technology. Any transcriptional errors that result from this process are unintentional.

## 2018-04-14 NOTE — Telephone Encounter (Signed)
Contacted pt pharmacy, [facility] [phone]#[phone], regarding refill request for levimir 30 units approved in error; prescription changed on 02/26/18 by Dr Norberto SorensonEva Shaw; spoke with pharmacist Thayer Ohmhris, and he states that will close out the order for 30 units at 10PM; will also contact pt to make her aware.

## 2018-04-15 ENCOUNTER — Other Ambulatory Visit: Payer: Self-pay

## 2018-04-15 ENCOUNTER — Encounter: Payer: Self-pay | Admitting: Adult Health

## 2018-04-15 ENCOUNTER — Ambulatory Visit: Payer: BLUE CROSS/BLUE SHIELD

## 2018-04-15 ENCOUNTER — Ambulatory Visit (INDEPENDENT_AMBULATORY_CARE_PROVIDER_SITE_OTHER): Payer: BLUE CROSS/BLUE SHIELD | Admitting: Adult Health

## 2018-04-15 VITALS — BP 125/70 | HR 88 | Ht 63.0 in | Wt 163.4 lb

## 2018-04-15 DIAGNOSIS — I639 Cerebral infarction, unspecified: Secondary | ICD-10-CM

## 2018-04-15 DIAGNOSIS — I63412 Cerebral infarction due to embolism of left middle cerebral artery: Secondary | ICD-10-CM | POA: Diagnosis not present

## 2018-04-15 DIAGNOSIS — I10 Essential (primary) hypertension: Secondary | ICD-10-CM | POA: Diagnosis not present

## 2018-04-15 DIAGNOSIS — E785 Hyperlipidemia, unspecified: Secondary | ICD-10-CM

## 2018-04-15 DIAGNOSIS — R4701 Aphasia: Secondary | ICD-10-CM

## 2018-04-15 DIAGNOSIS — G8191 Hemiplegia, unspecified affecting right dominant side: Secondary | ICD-10-CM

## 2018-04-15 MED ORDER — LEVETIRACETAM 500 MG PO TABS: 500.0000 mg | ORAL_TABLET | Freq: Two times a day (BID) | ORAL | 4 refills | Status: DC

## 2018-04-15 NOTE — Patient Instructions (Signed)
  Please complete the assigned speech therapy homework prior to your next session and return it to the speech therapist at your next visit.  

## 2018-04-15 NOTE — Patient Instructions (Addendum)
Continue aspirin 81 mg daily and clopidogrel 75 mg daily  and lipitor  for secondary stroke prevention  Continue to follow up with PCP regarding cholesterol, blood pressure and diabetes management   Follow up with endocrinologist as scheduled for diabetic management   Continue speech therapy - if additional orders are needed please let us know  Per [place] law, you should not be driving for 6 months after your most recent seizure due to safety for yourself and others on the road   You are not released to return to work at this time due to continued global aphasia and delayed processing   continue to stay active and maintain a healthy diet  Stress reduction techniques  Continue to monitor blood pressure at home  Maintain strict control of hypertension with blood pressure goal below 130/90, diabetes with hemoglobin A1c goal below 6.5% and cholesterol with LDL cholesterol (bad cholesterol) goal below 70 mg/dL. I also advised the patient to eat a healthy diet with plenty of whole grains, cereals, fruits and vegetables, exercise regularly and maintain ideal body weight.  Followup in the future with me in 6 months or call earlier if needed       Thank you for coming to see [place] at [facility]. I hope we have been able to provide you high quality care today.  You may receive a patient satisfaction survey over the next few weeks. We would appreciate your feedback and comments so that we may continue to improve ourselves and the health of our patients.        Mindfulness-Based Stress Reduction Mindfulness-based stress reduction (MBSR) is a program that helps people learn to practice mindfulness. Mindfulness is the practice of intentionally paying attention to the present moment. It can be learned and practiced through techniques such as education, breathing exercises, meditation, and yoga. MBSR includes several mindfulness techniques in one program. MBSR works best when you  understand the treatment, are willing to try new things, and can commit to spending time practicing what you learn. MBSR training may include learning about:  How your emotions, thoughts, and reactions affect your body.  New ways to respond to things that cause negative thoughts to start (triggers).  How to notice your thoughts and let go of them.  Practicing awareness of everyday things that you normally do without thinking.  The techniques and goals of different types of meditation.  What are the benefits of MBSR? MBSR can have many benefits, which include helping you to:  Develop self-awareness. This refers to knowing and understanding yourself.  Learn skills and attitudes that help you to participate in your own health care.  Learn new ways to care for yourself.  Be more accepting about how things are, and let things go.  Be less judgmental and approach things with an open mind.  Be patient with yourself and trust yourself more.  MBSR has also been shown to:  Reduce negative emotions, such as depression and anxiety.  Improve memory and focus.  Change how you sense and approach pain.  Boost your body's ability to fight infections.  Help you connect better with other people.  Improve your sense of well-being.  Follow these instructions at home:  Find a local in-person or online MBSR program.  Set aside some time regularly for mindfulness practice.  Find a mindfulness practice that works best for you. This may include one or more of the following: ? Meditation. Meditation involves focusing your mind on a certain thought or  activity. ? Breathing awareness exercises. These help you to stay present by focusing on your breath. ? Body scan. For this practice, you lie down and pay attention to each part of your body from head to toe. You can identify tension and soreness and intentionally relax parts of your body. ? Yoga. Yoga involves stretching and breathing, and it  can improve your ability to move and be flexible. It can also provide an experience of testing your body's limits, which can help you release stress. ? Mindful eating. This way of eating involves focusing on the taste, texture, color, and smell of each bite of food. Because this slows down eating and helps you feel full sooner, it can be an important part of a weight-loss plan.  Find a podcast or recording that provides guidance for breathing awareness, body scan, or meditation exercises. You can listen to these any time when you have a free moment to rest without distractions.  Follow your treatment plan as told by your health care provider. This may include taking regular medicines and making changes to your diet or lifestyle as recommended. How to practice mindfulness To do a basic awareness exercise:  Find a comfortable place to sit.  Pay attention to the present moment. Observe your thoughts, feelings, and surroundings just as they are.  Avoid placing judgment on yourself, your feelings, or your surroundings. Make note of any judgment that comes up, and let it go.  Your mind may wander, and that is okay. Make note of when your thoughts drift, and return your attention to the present moment.  To do basic mindfulness meditation:  Find a comfortable place to sit. This may include a stable chair or a firm floor cushion. ? Sit upright with your back straight. Let your arms fall next to your side with your hands resting on your legs. ? If sitting in a chair, rest your feet flat on the floor. ? If sitting on a cushion, cross your legs in front of you.  Keep your head in a neutral position with your chin dropped slightly. Relax your jaw and rest the tip of your tongue on the roof of your mouth. Drop your gaze to the floor. You can close your eyes if you like.  Breathe normally and pay attention to your breath. Feel the air moving in and out of your nose. Feel your belly expanding and relaxing  with each breath.  Your mind may wander, and that is okay. Make note of when your thoughts drift, and return your attention to your breath.  Avoid placing judgment on yourself, your feelings, or your surroundings. Make note of any judgment or feelings that come up, let them go, and bring your attention back to your breath.  When you are ready, lift your gaze or open your eyes. Pay attention to how your body feels after the meditation.  Where to find more information: You can find more information about MBSR from:  Your health care provider.  Community-based meditation centers or programs.  Programs offered near you.  Summary  Mindfulness-based stress reduction (MBSR) is a program that teaches you how to intentionally pay attention to the present moment. It is used with other treatments to help you cope better with daily stress, emotions, and pain.  MBSR focuses on developing self-awareness, which allows you to respond to life stress without judgment or negative emotions.  MBSR programs may involve learning different mindfulness practices, such as breathing exercises, meditation, yoga, body scan, or mindful  eating. Find a mindfulness practice that works best for you, and set aside time for it on a regular basis. This information is not intended to replace advice given to you by your health care provider. Make sure you discuss any questions you have with your health care provider. Document Released: 01/02/2017 Document Revised: 01/02/2017 Document Reviewed: 01/02/2017 Elsevier Interactive Patient Education  [employer].

## 2018-04-15 NOTE — Therapy (Signed)
[facility] [address] [place], [place], [address] Phone: [phone]   Fax:  [phone]  Speech Language Pathology Treatment  Patient Details  Name: Brittany [name] MRN: [id number] Date of Birth: Jun 16, 1961 Referring Provider: Dr. Norberto Sorenson   Encounter Date: 04/15/2018  End of Session - 04/15/18 1703    Visit Number  10    Number of Visits  25    Date for SLP Re-Evaluation  06/02/18    Authorization Type  Matisha has 30  visit limit, has had PT - will adjust visits accordingly. Darl Pikes says HHST used 3, leaving 27, however I believe she received more HHST per pt    Authorization - Visit Number  9    Authorization - Number of Visits  27    SLP Start Time  1404    SLP Stop Time   1446    SLP Time Calculation (min)  42 min    Activity Tolerance  Patient tolerated treatment well       Past Medical History:  Diagnosis Date  . Chronic lower back pain   . Depression   . Fibromyalgia   . High cholesterol   . Seizures (HCC) 11/2017   "day after she came home from hospital after having stroke" (01/01/2018)  . Stroke [facility]) 12/06/2017   "speech issues since" (01/01/2018)  . Type II diabetes mellitus (HCC)     Past Surgical History:  Procedure Laterality Date  . BACK SURGERY    . CHOLECYSTECTOMY    . INSERT / REPLACE / REMOVE PACEMAKER  01/01/2018  . IR CT HEAD LTD  12/06/2017  . IR PERCUTANEOUS ART THROMBECTOMY/INFUSION INTRACRANIAL INC DIAG ANGIO  12/06/2017  . LOOP RECORDER INSERTION N/A 12/12/2017   Procedure: LOOP RECORDER INSERTION;  Surgeon: Regan Lemming, MD;  [facility] INV[facility];  Service: Cardiovascular;  Laterality: N/A;  . LOOP RECORDER REMOVAL  01/01/2018  . LOOP RECORDER REMOVAL N/A 01/01/2018   Procedure: LOOP RECORDER REMOVAL;  Surgeon: Regan Lemming, MD;  Location: [facility] INVASIVE CV LAB;  Service: Cardiovascular;  Laterality: N/A;  . PACEMAKER IMPLANT N/A 01/01/2018   Procedure: PACEMAKER  IMPLANT;  Surgeon: Regan Lemming, MD;  Location: [facility] CV LAB;  Service: Cardiovascular;  Laterality: N/A;  . RADIOLOGY WITH ANESTHESIA N/A 12/06/2017   Procedure: CODE STROKE;  Surgeon: Julieanne Cotton, MD;  Location: [facility] OR;  Service: Radiology;  Laterality: N/A;  . TEE WITHOUT CARDIOVERSION N/A 12/10/2017   Procedure: TRANSESOPHAGEAL ECHOCARDIOGRAM (TEE);  Surgeon: Jake Bathe, MD;  Location: [facility] ENDOSCOPY;  Service: Cardiovascular;  Laterality: N/A;  . THROMBECTOMY FEMORAL ARTERY Right 12/07/2017   Procedure: THROMBECTOMY RIGHT FEMORAL ARTERY;  Surgeon: Maeola Harman, MD;  Location: [facility] OR;  Service: Vascular;  Laterality: Right;  . TONSILLECTOMY      There were no vitals filed for this visit.  Subjective Assessment - 04/15/18 1404    Subjective  "(unintelligible) Tried to get me one for Friday."    Patient is accompained by:  Family member    Currently in Pain?  No/denies            ADULT SLP TREATMENT - 04/15/18 1406      General Information   Behavior/Cognition  Alert;Cooperative;Pleasant mood      Treatment Provided   Treatment provided  Cognitive-Linquistic      Cognitive-Linquistic Treatment   Treatment focused on  Aphasia    Skilled Treatment  SLP targeted pt's verbal expression today  and her ability to use compensations in phrase/sentence responses by having pt describe pictures. Pt skills were normal approx 15% of the time and incr'd to 50% when considering if pt's language was functional for comprehension of message. Literal paraphasia and neologism at times challenged this trained listener. SLP used questioning cues and phonemic cues (mod cues) occasionally, which were more helpful than semantic cues today.       Assessment / Recommendations / Plan   Plan  Continue with current plan of care      Progression Toward Goals   Progression toward goals  Progressing toward goals         SLP Short Term Goals - 04/15/18 1704      SLP SHORT  TERM GOAL #1   Title  Pt will comprehend 3-4 phrase  description to ID or name object described 8/10x with occasional repetition and occasional min A    Time  2    Period  Weeks    Status  On-going      SLP SHORT TERM GOAL #2   Title  Pt will give 2-3 word phrase for a given word or picture with occasional min-mod A over 2 sessions.     Time  2    Period  Weeks    Status  Revised      SLP SHORT TERM GOAL #3   Title  Pt will name 5 items in a simple category with occasional min-mod A over 2 sessions    Time  2    Period  Weeks    Status  On-going      SLP SHORT TERM GOAL #4   Title  Pt will ID aphasic errors in naming and sentence generation tasks, stop and attempt to self correct 5/10x iwth occasional min A    Time  3    Period  Weeks    Status  On-going      SLP SHORT TERM GOAL #5   Title  Pt will write a word to name an object or picture, with occasional min A for error awareness    Time  3    Period  Weeks    Status  Revised       SLP Long Term Goals - 04/15/18 1705      SLP LONG TERM GOAL #1   Title  Pt will comprehend 8 minute simlple conversation with occasional min gestrual cues and repetition over 2 sessions    Time  8    Period  Weeks    Status  On-going      SLP LONG TERM GOAL #2   Title  Pt will ID aphasic errors/press of speech in simple conversation with occasional min A over 2 sessions    Time  8    Period  Weeks    Status  On-going      SLP LONG TERM GOAL #3   Title  Pt will comprehend 2-3 simple paragraph passage with minimal extended time and 80% accuracy    Time  8    Period  Weeks    Status  On-going      SLP LONG TERM GOAL #4   Title  Pt will generate functional written tasks (lists, checks, schedules, short email/letter ) with error ID 3/5 errors and occasional min A.    Time  8    Period  Weeks    Status  On-going       Plan - 04/15/18 1703  Clinical Impression Statement  Pt continues to present with moderate aphasia affecting her  receptive and expressive language. Sentence level tasks and simple conversation again exhibit signficant paraphasias and neologism today, affecting her ability to communicate with family and friends. Continue skilled ST to maximize communication for improved independence and to reduce caregiver burden    Speech Therapy Frequency  2x / week    Duration  -- 12 weeks or 25 visits tota    Treatment/Interventions  SLP instruction and feedback;Environmental controls;Compensatory techniques;Cognitive reorganization;Functional tasks;Compensatory strategies;Patient/family education;Multimodal communcation approach;Internal/external aids;Language facilitation    Potential to Achieve Goals  Good    Potential Considerations  Severity of impairments       Patient will benefit from skilled therapeutic intervention in order to improve the following deficits and impairments:   Aphasia    Problem List Patient Active Problem List   Diagnosis Date Noted  . Non compliance with medical treatment 03/23/2018  . History of tobacco use disorder 01/01/2018  . Syncope and collapse 01/01/2018  . 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 Acute right hemiparesis (HCC) 12/12/2017  . History of completed stroke 12/12/2017  . Leukocytosis 12/12/2017  . Hypokalemia 12/12/2017  . Hypotension   . 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 Poor compliance with CPAP treatment 03/04/2017  . OSA and COPD overlap syndrome (HCC) 03/04/2017  . Simple chronic bronchitis (HCC) 03/04/2017  . MCI (mild cognitive impairment) 11/27/2016  . Left sided lacunar infarction (HCC) 03/11/2014  . Cerebrovascular small vessel disease 03/11/2014  . Insomnia, idiopathic 12/20/2013  . Snoring 12/06/2013  . Essential hypertension, benign 10/26/2012  . Hyperlipidemia LDL goal <70 10/26/2012   . Polypharmacy 10/26/2012  . DM (diabetes mellitus) (HCC) 10/13/2011  . Tremor 10/13/2011  . GERD (gastroesophageal reflux disease) 10/13/2011  . Depression 10/13/2011    [facility] LLCCHINKE,CARL ,MS, CCC-SLP  04/15/2018, 5:05 PM  [place] [facility] [address] [place], [place], [address] Phone: [phone]   Fax:  [phone]   Name: Jenne CampusDenise W Timmins MRN: [id number] Date of Birth: Dec 24, 1960

## 2018-04-17 LAB — CUP PACEART INCLINIC DEVICE CHECK
Battery Remaining Longevity: 183 mo
Brady Statistic AP VP Percent: 0.59 %
Brady Statistic AP VS Percent: 0.31 %
Brady Statistic AS VP Percent: 0.28 %
Brady Statistic RV Percent Paced: 0.86 %
Date Time Interrogation Session: 20190729154040
Implantable Lead Implant Date: 20190425
Implantable Lead Location: 753860
Implantable Lead Model: 5076
Lead Channel Impedance Value: 437 Ohm
Lead Channel Impedance Value: 532 Ohm
Lead Channel Sensing Intrinsic Amplitude: 21.5 mV
Lead Channel Sensing Intrinsic Amplitude: 27.25 mV
Lead Channel Setting Pacing Amplitude: 2.5 V
Lead Channel Setting Pacing Pulse Width: 0.4 ms
Lead Channel Setting Sensing Sensitivity: 2 mV
MDC IDC LEAD IMPLANT DT: 20190425
MDC IDC LEAD LOCATION: 753859
MDC IDC MSMT BATTERY VOLTAGE: 3.2 V
MDC IDC MSMT LEADCHNL RA IMPEDANCE VALUE: 342 Ohm
MDC IDC MSMT LEADCHNL RA IMPEDANCE VALUE: 589 Ohm
MDC IDC MSMT LEADCHNL RA PACING THRESHOLD AMPLITUDE: 0.75 V
MDC IDC MSMT LEADCHNL RA PACING THRESHOLD PULSEWIDTH: 0.4 ms
MDC IDC MSMT LEADCHNL RA SENSING INTR AMPL: 1 mV
MDC IDC MSMT LEADCHNL RA SENSING INTR AMPL: 2.5 mV
MDC IDC MSMT LEADCHNL RV PACING THRESHOLD AMPLITUDE: 0.5 V
MDC IDC MSMT LEADCHNL RV PACING THRESHOLD PULSEWIDTH: 0.4 ms
MDC IDC PG IMPLANT DT: 20190425
MDC IDC SET LEADCHNL RA PACING AMPLITUDE: 2 V
MDC IDC STAT BRADY AS VS PERCENT: 98.83 %
MDC IDC STAT BRADY RA PERCENT PACED: 0.92 %

## 2018-04-21 ENCOUNTER — Ambulatory Visit: Payer: BLUE CROSS/BLUE SHIELD | Admitting: Speech Pathology

## 2018-04-21 DIAGNOSIS — R4701 Aphasia: Secondary | ICD-10-CM

## 2018-04-21 NOTE — Therapy (Signed)
[facility] [address] [place], [place], [address] Phone: [phone]   Fax:  [phone]  Speech Language Pathology Treatment  Patient Details  Name: Brittany [name] MRN: [id number] Date of Birth: 1961-08-15 Referring Provider: Dr. Norberto SorensonEva Shaw   Encounter Date: 04/21/2018  End of Session - 04/21/18 1711    Visit Number  11    Number of Visits  25    Date for SLP Re-Evaluation  06/02/18    Authorization Type  Angelique BlonderDenise has 30  visit limit, has had PT - will adjust visits accordingly. Darl PikesSusan says HHST used 3, leaving 27, however I believe she received more HHST per pt    Authorization Time Period  I initally scheduled for 2x a week for 8 weeks, will add on visists per POC as indicated    Authorization - Visit Number  10    Authorization - Number of Visits  27    SLP Start Time  1620    SLP Stop Time   1702    SLP Time Calculation (min)  42 min    Activity Tolerance  Patient tolerated treatment well       Past Medical History:  Diagnosis Date  . Chronic lower back pain   . Depression   . Fibromyalgia   . High cholesterol   . Seizures (HCC) 11/2017   "day after she came home from hospital after having stroke" (01/01/2018)  . Stroke [facility](H[facility]) 12/06/2017   "speech issues since" (01/01/2018)  . Type II diabetes mellitus (HCC)     Past Surgical History:  Procedure Laterality Date  . BACK SURGERY    . CHOLECYSTECTOMY    . INSERT / REPLACE / REMOVE PACEMAKER  01/01/2018  . IR CT HEAD LTD  12/06/2017  . IR PERCUTANEOUS ART THROMBECTOMY/INFUSION INTRACRANIAL INC DIAG ANGIO  12/06/2017  . LOOP RECORDER INSERTION N/A 12/12/2017   Procedure: LOOP RECORDER INSERTION;  Surgeon: Regan Lemmingamnitz, Will Martin, MD;  [facility]: [facility] IN[facility];  Service: Cardiovascular;  Laterality: N/A;  . LOOP RECORDER REMOVAL  01/01/2018  . LOOP RECORDER REMOVAL N/A 01/01/2018   Procedure: LOOP RECORDER REMOVAL;  Surgeon: Regan Lemmingamnitz, Will Martin, MD;  Location:  [facility];  Service: Cardiovascular;  Laterality: N/A;  . PACEMAKER IMPLANT N/A 01/01/2018   Procedure: PACEMAKER IMPLANT;  Surgeon: Regan Lemmingamnitz, Will Martin, MD;  [facility]: [facility];  Service: Cardiovascular;  Laterality: N/A;  . RADIOLOGY WITH ANESTHESIA N/A 12/06/2017   Procedure: CODE STROKE;  Surgeon: Julieanne Cottoneveshwar, Sanjeev, MD;  Location: [facility] OR;  Service: Radiology;  Laterality: N/A;  . TEE WITHOUT CARDIOVERSION N/A 12/10/2017   Procedure: TRANSESOPHAGEAL ECHOCARDIOGRAM (TEE);  Surgeon: Jake BatheSkains, Mark C, MD;  [facility];  Service: Cardiovascular;  Laterality: N/A;  . THROMBECTOMY FEMORAL ARTERY Right 12/07/2017   Procedure: THROMBECTOMY RIGHT FEMORAL ARTERY;  Surgeon: Maeola Harmanain, Brandon Christopher, MD;  [facility];  Service: Vascular;  Laterality: Right;  . TONSILLECTOMY      There were no vitals filed for this visit.  Subjective Assessment - 04/21/18 1622    Subjective  "The more wast doing back then."    Patient is accompained by:  Family member   Apolinar JunesBrandon   Currently in Pain?  No/denies            ADULT SLP TREATMENT - 04/21/18 1620      General Information   Behavior/Cognition  Alert;Cooperative;Pleasant mood      Treatment Provided   Treatment provided  Cognitive-Linquistic  Cognitive-Linquistic Treatment   Treatment focused on  Aphasia    Skilled Treatment  SLP targeted pt's comprehension of verbal phrases with simple responsive naming task; pt named object when given 2-3 word description 70% accuracy with repetition and occasional question cues. Pt gave 2-3 word descriptions of simple objects with occasional min-mod A. Pt named 4-5 items in a category with usual mod A, multimodal cues.      Assessment / Recommendations / Plan   Plan  Continue with current plan of care      Progression Toward Goals   Progression toward goals  Progressing toward goals         SLP Short Term Goals - 04/21/18 1628      SLP SHORT TERM GOAL #1   Title  Pt  will comprehend 3-4 phrase  description to ID or name object described 8/10x with occasional repetition and occasional min A    Time  1    Period  Weeks    Status  On-going      SLP SHORT TERM GOAL #2   Title  Pt will give 2-3 word phrase for a given word or picture with occasional min-mod A over 2 sessions.     Baseline  04/21/18    Time  1    Period  Weeks    Status  On-going      SLP SHORT TERM GOAL #3   Title  Pt will name 5 items in a simple category with occasional min-mod A over 2 sessions    Time  1    Period  Weeks    Status  On-going      SLP SHORT TERM GOAL #4   Title  Pt will ID aphasic errors in naming and sentence generation tasks, stop and attempt to self correct 5/10x iwth occasional min A    Time  2    Period  Weeks    Status  On-going      SLP SHORT TERM GOAL #5   Title  Pt will write a word to name an object or picture, with occasional min A for error awareness    Time  2    Period  Weeks    Status  On-going       SLP Long Term Goals - 04/21/18 1710      SLP LONG TERM GOAL #1   Title  Pt will comprehend 8 minute simlple conversation with occasional min gestrual cues and repetition over 2 sessions    Time  7    Period  Weeks    Status  On-going      SLP LONG TERM GOAL #2   Title  Pt will ID aphasic errors/press of speech in simple conversation with occasional min A over 2 sessions    Time  7    Period  Weeks    Status  On-going      SLP LONG TERM GOAL #3   Title  Pt will comprehend 2-3 simple paragraph passage with minimal extended time and 80% accuracy    Time  7    Period  Weeks    Status  On-going      SLP LONG TERM GOAL #4   Title  Pt will generate functional written tasks (lists, checks, schedules, short email/letter ) with error ID 3/5 errors and occasional min A.    Time  7    Period  Weeks    Status  On-going  Plan - 04/21/18 1711    Clinical Impression Statement  Pt continues to present with moderate aphasia affecting her  receptive and expressive language. Sentence level tasks and simple conversation again exhibit signficant paraphasias and neologism today, affecting her ability to communicate with family and friends. Continue skilled ST to maximize communication for improved independence and to reduce caregiver burden       Patient will benefit from skilled therapeutic intervention in order to improve the following deficits and impairments:   Aphasia    Problem List Patient Active Problem List   Diagnosis Date Noted  . Non compliance with medical treatment 03/23/2018  . History of tobacco use disorder 01/01/2018  . Syncope and collapse 01/01/2018  . 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 Acute right hemiparesis (HCC) 12/12/2017  . History of completed stroke 12/12/2017  . Leukocytosis 12/12/2017  . Hypokalemia 12/12/2017  . Hypotension   . 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 Poor compliance with CPAP treatment 03/04/2017  . OSA and COPD overlap syndrome (HCC) 03/04/2017  . Simple chronic bronchitis (HCC) 03/04/2017  . MCI (mild cognitive impairment) 11/27/2016  . Left sided lacunar infarction (HCC) 03/11/2014  . Cerebrovascular small vessel disease 03/11/2014  . Insomnia, idiopathic 12/20/2013  . Snoring 12/06/2013  . Essential hypertension, benign 10/26/2012  . Hyperlipidemia LDL goal <70 10/26/2012  . Polypharmacy 10/26/2012  . DM (diabetes mellitus) (HCC) 10/13/2011  . Tremor 10/13/2011  . GERD (gastroesophageal reflux disease) 10/13/2011  . Depression 10/13/2011   Rondel BatonMary Beth Mannix Kroeker, MS, CCC-SLP Speech-Language Pathologist  Arlana LindauMary E Zanovia Rotz 04/21/2018, 5:12 PM  [place] [facility] [address] [place], [place], [address] Phone: [phone]   Fax:  [phone]   Name: Brittany CampusDenise  W [name] MRN: [id number] Date of Birth: Jun 17, 1961

## 2018-04-22 DIAGNOSIS — Z0289 Encounter for other administrative examinations: Secondary | ICD-10-CM

## 2018-04-23 ENCOUNTER — Telehealth: Payer: Self-pay | Admitting: *Deleted

## 2018-04-23 ENCOUNTER — Ambulatory Visit: Payer: BLUE CROSS/BLUE SHIELD | Admitting: Speech Pathology

## 2018-04-23 ENCOUNTER — Encounter: Payer: Self-pay | Admitting: Speech Pathology

## 2018-04-23 DIAGNOSIS — R4701 Aphasia: Secondary | ICD-10-CM | POA: Diagnosis not present

## 2018-04-23 NOTE — Progress Notes (Signed)
I agree with the above plan 

## 2018-04-23 NOTE — Therapy (Signed)
[facility] [address] [place], [place], [address] Phone: [phone]   Fax:  [phone]  Speech Language Pathology Treatment  Patient Details  Name: Brittany [name] MRN: [id number] Date of Birth: [birth year] Referring Provider: Dr. Delman Cheadle   Encounter Date: 04/23/2018  End of Session - 04/23/18 1414    Visit Number  12    Number of Visits  25    Date for SLP Re-Evaluation  06/02/18    Authorization Type  [name] has 30  visit limit, has had PT - will adjust visits accordingly. [name] says [place] used 3, leaving 27, however I believe she received more HHST per pt    Authorization Time Period  I initally scheduled for 2x a week for 8 weeks, will add on visists per POC as indicated    Authorization - Visit Number  12    Authorization - Number of Visits  27    SLP Start Time  1103    SLP Stop Time   1145    SLP Time Calculation (min)  42 min    Activity Tolerance  Patient tolerated treatment well       Past Medical History:  Diagnosis Date  . Chronic lower back pain   . Depression   . Fibromyalgia   . High cholesterol   . Seizures ([place]) 11/2017   "day after she came home from hospital after having stroke" (01/01/2018)  . Stroke [facility]) 12/06/2017   "speech issues since" (01/01/2018)  . Type II diabetes mellitus ([place])     Past Surgical History:  Procedure Laterality Date  . BACK SURGERY    . CHOLECYSTECTOMY    . INSERT / REPLACE / REMOVE PACEMAKER  01/01/2018  . IR CT HEAD LTD  12/06/2017  . IR PERCUTANEOUS ART THROMBECTOMY/INFUSION INTRACRANIAL INC DIAG ANGIO  12/06/2017  . LOOP RECORDER INSERTION N/A 12/12/2017   Procedure: LOOP RECORDER INSERTION;  Surgeon: Constance Haw, MD;  Location: [facility];  Service: Cardiovascular;  Laterality: N/A;  . LOOP RECORDER REMOVAL  01/01/2018  . LOOP RECORDER REMOVAL N/A 01/01/2018   Procedure: LOOP RECORDER REMOVAL;  Surgeon: Constance Haw, MD;  Location:  [facility];  Service: Cardiovascular;  Laterality: N/A;  . PACEMAKER IMPLANT N/A 01/01/2018   Procedure: PACEMAKER IMPLANT;  Surgeon: Constance Haw, MD;  Location: [facility];  Service: Cardiovascular;  Laterality: N/A;  . RADIOLOGY WITH ANESTHESIA N/A 12/06/2017   Procedure: CODE STROKE;  Surgeon: Luanne Bras, MD;  Location: [facility];  Service: Radiology;  Laterality: N/A;  . TEE WITHOUT CARDIOVERSION N/A 12/10/2017   Procedure: TRANSESOPHAGEAL ECHOCARDIOGRAM (TEE);  Surgeon: Jerline Pain, MD;  Location: [facility];  Service: Cardiovascular;  Laterality: N/A;  . THROMBECTOMY FEMORAL ARTERY Right 12/07/2017   Procedure: THROMBECTOMY RIGHT FEMORAL ARTERY;  Surgeon: Waynetta Sandy, MD;  Location: [facility];  Service: Vascular;  Laterality: Right;  . TONSILLECTOMY      There were no vitals filed for this visit.  Subjective Assessment - 04/23/18 1407    Subjective  "I don't uh I don't like - its all of them - too much" re: seeing 3 different ST's    Patient is accompained by:  Family member   son, Brittany [name]   Currently in Pain?  No/denies            ADULT SLP TREATMENT - 04/23/18 1408      General Information   Behavior/Cognition  Alert;Cooperative;Pleasant mood  Treatment Provided   Treatment provided  Cognitive-Linquistic      Cognitive-Linquistic Treatment   Treatment focused on  Aphasia    Skilled Treatment  Sentence production, word finding and verbal expression facilitated with pt generating 3 subjects and objects for given verbs with usual mod questioning, phonemic and semantic cues. She answered "wh" questions re: 2 subject verb object (SVO) combinations to form complex sentence with usual mod A. Pt named 8 items with extended time and mod I in personally relevant topic (office supplies) Pt required mod A to name more that 8 due to word finding difficulties. Utilized semantic feature analysis chart (SFA) to facilitate word finding with  success 1/3 attempts and questioning cues to fill lin chart.      Assessment / Recommendations / Plan   Plan  Continue with current plan of care      Progression Toward Goals   Progression toward goals  Progressing toward goals         SLP Short Term Goals - 04/23/18 1413      SLP SHORT TERM GOAL #1   Title  Pt will comprehend 3-4 phrase  description to ID or name object described 8/10x with occasional repetition and occasional min A    Time  1    Period  Weeks    Status  Achieved      SLP SHORT TERM GOAL #2   Title  Pt will give 2-3 word phrase for a given word or picture with occasional min-mod A over 2 sessions.     Baseline  04/21/18; 04/23/18    Time  1    Period  Weeks    Status  Achieved      SLP SHORT TERM GOAL #3   Title  Pt will name 5 items in a simple category with occasional min-mod A over 2 sessions    Baseline  04/23/18;     Time  1    Period  Weeks    Status  Partially Met      SLP SHORT TERM GOAL #4   Title  Pt will ID aphasic errors in naming and sentence generation tasks, stop and attempt to self correct 5/10x iwth occasional min A    Time  2    Period  Weeks    Status  On-going      SLP SHORT TERM GOAL #5   Title  Pt will write a word to name an object or picture, with occasional min A for error awareness    Time  1    Period  Weeks    Status  On-going       SLP Long Term Goals - 04/23/18 1414      SLP LONG TERM GOAL #1   Title  Pt will comprehend 8 minute simlple conversation with occasional min gestrual cues and repetition over 2 sessions    Time  7    Period  Weeks    Status  On-going      SLP LONG TERM GOAL #2   Title  Pt will ID aphasic errors/press of speech in simple conversation with occasional min A over 2 sessions    Time  7    Period  Weeks    Status  On-going      SLP LONG TERM GOAL #3   Title  Pt will comprehend 2-3 simple paragraph passage with minimal extended time and 80% accuracy    Time  7    Period  Weeks  Status   On-going      SLP LONG TERM GOAL #4   Title  Pt will generate functional written tasks (lists, checks, schedules, short email/letter ) with error ID 3/5 errors and occasional min A.    Time  7    Period  Weeks    Status  On-going       Plan - 04/23/18 1413    Clinical Impression Statement  Pt continues to present with moderate aphasia affecting her receptive and expressive language. Sentence level tasks and simple conversation again exhibit signficant paraphasias and neologism today, affecting her ability to communicate with family and friends. Continue skilled ST to maximize communication for improved independence and to reduce caregiver burden    Speech Therapy Frequency  2x / week    Treatment/Interventions  SLP instruction and feedback;Environmental controls;Compensatory techniques;Cognitive reorganization;Functional tasks;Compensatory strategies;Patient/family education;Multimodal communcation approach;Internal/external aids;Language facilitation    Potential Considerations  Severity of impairments       Patient will benefit from skilled therapeutic intervention in order to improve the following deficits and impairments:   Aphasia    Problem List Patient Active Problem List   Diagnosis Date Noted  . Non compliance with medical treatment 03/23/2018  . History of tobacco use disorder 01/01/2018  . Syncope and collapse 01/01/2018  . 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 Acute right hemiparesis ([place]) 12/12/2017  . History of completed stroke 12/12/2017  . Leukocytosis 12/12/2017  . Hypokalemia 12/12/2017  . Hypotension   . 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 Poor compliance with CPAP treatment 03/04/2017  . OSA and COPD overlap syndrome ([place]) 03/04/2017  . Simple chronic bronchitis ([place]) 03/04/2017  . MCI (mild  cognitive impairment) 11/27/2016  . Left sided lacunar infarction ([place]) 03/11/2014  . Cerebrovascular small vessel disease 03/11/2014  . Insomnia, idiopathic 12/20/2013  . Snoring 12/06/2013  . Essential hypertension, benign 10/26/2012  . Hyperlipidemia LDL goal <70 10/26/2012  . Polypharmacy 10/26/2012  . DM (diabetes mellitus) ([place]) 10/13/2011  . Tremor 10/13/2011  . GERD (gastroesophageal reflux disease) 10/13/2011  . Depression 10/13/2011    Samreet [place], Annye Rusk MS, CCC-SLP 04/23/2018, 2:16 PM  [facility] [address] [place], [place], [address] Phone: [phone]   Fax:  [phone]   Name: AILEA RHATIGAN MRN: [id number] Date of Birth: 09-11-1960

## 2018-04-23 NOTE — Telephone Encounter (Signed)
Followed up with patient on her BPs, per Dr. Elberta Fortisamnitz. Pt reports BPs normal, not elevated.  Confirms that it has not been above 140/90s. Advised to continue to monitor and call PCP or our office to discuss if she begins to see it elevated.  She understands to call if either or both numbers are greater than 140s/90s.

## 2018-04-27 ENCOUNTER — Telehealth: Payer: Self-pay

## 2018-04-27 NOTE — Telephone Encounter (Signed)
Patient paid 50.00 for another FMLA form.Dr. Pearlean BrownieSethi did form. PT will be out of work for 6 months. PT still has moderate aphasia she works in Clinical biochemistcustomer service. Pt is still in speech,and occupational therapy. Form given to medical records so they can fax,along with office notes.

## 2018-04-28 ENCOUNTER — Ambulatory Visit: Payer: BLUE CROSS/BLUE SHIELD | Admitting: Speech Pathology

## 2018-04-28 ENCOUNTER — Telehealth: Payer: Self-pay | Admitting: *Deleted

## 2018-04-28 ENCOUNTER — Encounter: Payer: Self-pay | Admitting: Speech Pathology

## 2018-04-28 DIAGNOSIS — R4701 Aphasia: Secondary | ICD-10-CM | POA: Diagnosis not present

## 2018-04-28 NOTE — Telephone Encounter (Signed)
I faxed pt FMLA form to HR group on 04/28/18

## 2018-04-28 NOTE — Therapy (Signed)
[facility] [address] [place], [place], [address] Phone: [phone]   Fax:  [phone]  Speech Language Pathology Treatment  Patient Details  Name: Brittany [name] MRN: [id number] Date of Birth: Dec 12, 1960 Referring Provider: Dr. Delman Cheadle   Encounter Date: 04/28/2018  End of Session - 04/28/18 1329    Visit Number  13    Number of Visits  25    Date for SLP Re-Evaluation  06/02/18    Authorization - Visit Number  13    Authorization - Number of Visits  94    SLP Start Time  1232    SLP Stop Time   1027    SLP Time Calculation (min)  45 min    Activity Tolerance  Patient tolerated treatment well       Past Medical History:  Diagnosis Date  . Chronic lower back pain   . Depression   . Fibromyalgia   . High cholesterol   . Seizures ([place]) 11/2017   "day after she came home from hospital after having stroke" (01/01/2018)  . Stroke [facility]) 12/06/2017   "speech issues since" (01/01/2018)  . Type II diabetes mellitus ([place])     Past Surgical History:  Procedure Laterality Date  . BACK SURGERY    . CHOLECYSTECTOMY    . INSERT / REPLACE / REMOVE PACEMAKER  01/01/2018  . IR CT HEAD LTD  12/06/2017  . IR PERCUTANEOUS ART THROMBECTOMY/INFUSION INTRACRANIAL INC DIAG ANGIO  12/06/2017  . LOOP RECORDER INSERTION N/A 12/12/2017   Procedure: LOOP RECORDER INSERTION;  Surgeon: Constance Haw, MD;  Location: [facility];  Service: Cardiovascular;  Laterality: N/A;  . LOOP RECORDER REMOVAL  01/01/2018  . LOOP RECORDER REMOVAL N/A 01/01/2018   Procedure: LOOP RECORDER REMOVAL;  Surgeon: Constance Haw, MD;  Location: [facility];  Service: Cardiovascular;  Laterality: N/A;  . PACEMAKER IMPLANT N/A 01/01/2018   Procedure: PACEMAKER IMPLANT;  Surgeon: Constance Haw, MD;  Location: [facility];  Service: Cardiovascular;  Laterality: N/A;  . RADIOLOGY WITH ANESTHESIA N/A 12/06/2017   Procedure:  CODE STROKE;  Surgeon: Luanne Bras, MD;  Location: [facility];  Service: Radiology;  Laterality: N/A;  . TEE WITHOUT CARDIOVERSION N/A 12/10/2017   Procedure: TRANSESOPHAGEAL ECHOCARDIOGRAM (TEE);  Surgeon: Jerline Pain, MD;  Location: [facility] ENDOSCOPY;  Service: Cardiovascular;  Laterality: N/A;  . THROMBECTOMY FEMORAL ARTERY Right 12/07/2017   Procedure: THROMBECTOMY RIGHT FEMORAL ARTERY;  Surgeon: Waynetta Sandy, MD;  Location: [facility];  Service: Vascular;  Laterality: Right;  . TONSILLECTOMY      There were no vitals filed for this visit.  Subjective Assessment - 04/28/18 1239    Subjective  "She is talking better and got to talk to her grandkids this weekend, so it is a pick me up"    Patient is accompained by:  Family member    Currently in Pain?  No/denies    Pain Onset  Today            ADULT SLP TREATMENT - 04/28/18 1241      General Information   Behavior/Cognition  Alert;Cooperative;Pleasant mood      Treatment Provided   Treatment provided  Cognitive-Linquistic      Cognitive-Linquistic Treatment   Treatment focused on  Aphasia    Skilled Treatment  Sentence production, word finding and verbal expression facilitated with pt generating 3 subjects and objects for given verbs with usual mod questioning, phonemic  and semantic cues. She answered "wh" questions re: 2 subject verb object (SVO) combinations to form complex sentence with usual mod A. Pt named 8 items with extended time and mod I in personally relevant topic (office supplies) Pt required mod A to name 4 items in simple category of pt's ADL's (cleaning supplies) due to neologisms /paraphasias. Pt IDing aphasic errors in her speech 50% of errors with min A      Assessment / Recommendations / Plan   Plan  Continue with current plan of care      Progression Toward Goals   Progression toward goals  Progressing toward goals         SLP Short Term Goals - 04/28/18 1327      SLP SHORT TERM GOAL #1    Title  Pt will comprehend 3-4 phrase  description to ID or name object described 8/10x with occasional repetition and occasional min A    Time  1    Period  Weeks    Status  Achieved      SLP SHORT TERM GOAL #2   Title  Pt will give 2-3 word phrase for a given word or picture with occasional min-mod A over 2 sessions.     Baseline  04/21/18; 04/23/18    Time  1    Period  Weeks    Status  Achieved      SLP SHORT TERM GOAL #3   Title  Pt will name 5 items in a simple category with occasional min-mod A over 2 sessions    Baseline  04/23/18;     Time  1    Period  Weeks    Status  Partially Met      SLP SHORT TERM GOAL #4   Title  Pt will ID aphasic errors in naming and sentence generation tasks, stop and attempt to self correct 5/10x iwth occasional min A    Baseline  04/28/18    Time  2    Period  Weeks    Status  Achieved      SLP SHORT TERM GOAL #5   Title  Pt will write a word to name an object or picture, with occasional min A for error awareness    Time  1    Period  Weeks    Status  On-going       SLP Long Term Goals - 04/28/18 1329      SLP LONG TERM GOAL #1   Title  Pt will comprehend 8 minute simlple conversation with occasional min gestrual cues and repetition over 2 sessions    Time  6    Period  Weeks    Status  On-going      SLP LONG TERM GOAL #2   Title  Pt will ID aphasic errors/press of speech in simple conversation with occasional min A over 2 sessions    Time  6    Period  Weeks    Status  On-going      SLP LONG TERM GOAL #3   Title  Pt will comprehend 2-3 simple paragraph passage with minimal extended time and 80% accuracy    Time  6    Period  Weeks    Status  On-going      SLP LONG TERM GOAL #4   Title  Pt will generate functional written tasks (lists, checks, schedules, short email/letter ) with error ID 3/5 errors and occasional min A.    Time  6  Period  Weeks    Status  On-going       Plan - 04/28/18 1327    Clinical Impression  Statement  Pt continues to present with moderate aphasia affecting her receptive and expressive language. Sentence level tasks and simple conversation again exhibit signficant paraphasias and neologism today, affecting her ability to communicate with family and friends. Continue skilled ST to maximize communication for improved independence and to reduce caregiver burden    Speech Therapy Frequency  2x / week    Treatment/Interventions  SLP instruction and feedback;Environmental controls;Compensatory techniques;Cognitive reorganization;Functional tasks;Compensatory strategies;Patient/family education;Multimodal communcation approach;Internal/external aids;Language facilitation    Potential to Achieve Goals  Good    Potential Considerations  Severity of impairments       Patient will benefit from skilled therapeutic intervention in order to improve the following deficits and impairments:   Aphasia    Problem List Patient Active Problem List   Diagnosis Date Noted  . Non compliance with medical treatment 03/23/2018  . History of tobacco use disorder 01/01/2018  . Syncope and collapse 01/01/2018  . 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 Acute right hemiparesis ([place]) 12/12/2017  . History of completed stroke 12/12/2017  . Leukocytosis 12/12/2017  . Hypokalemia 12/12/2017  . Hypotension   . 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 Poor compliance with CPAP treatment 03/04/2017  . OSA and COPD overlap syndrome ([place]) 03/04/2017  . Simple chronic bronchitis ([place]) 03/04/2017  . MCI (mild cognitive impairment) 11/27/2016  . Left sided lacunar infarction ([place]) 03/11/2014  . Cerebrovascular small vessel disease 03/11/2014  . Insomnia, idiopathic 12/20/2013  . Snoring 12/06/2013  . Essential hypertension, benign 10/26/2012  . Hyperlipidemia  LDL goal <70 10/26/2012  . Polypharmacy 10/26/2012  . DM (diabetes mellitus) ([place]) 10/13/2011  . Tremor 10/13/2011  . GERD (gastroesophageal reflux disease) 10/13/2011  . Depression 10/13/2011    Krist[place]her [place], Annye Rusk MS, CCC-SLP 04/28/2018, 1:30 PM  [facility] [address] [place], [place], [address] Phone: [phone]   Fax:  [phone]   Name: MARGORIE RENNER MRN: [id number] Date of Birth: 11-18-1960

## 2018-04-30 ENCOUNTER — Ambulatory Visit: Payer: BLUE CROSS/BLUE SHIELD | Admitting: Speech Pathology

## 2018-04-30 DIAGNOSIS — R4701 Aphasia: Secondary | ICD-10-CM | POA: Diagnosis not present

## 2018-04-30 NOTE — Therapy (Signed)
[facility] [address] [place], [place], [address] Phone: [phone]   Fax:  [phone]  Speech Language Pathology Treatment  Patient Details  Name: Brittany [name] MRN: [id number] Date of Birth: 05-30-1961 Referring Provider: Dr. Delman Cheadle   Encounter Date: 04/30/2018  End of Session - 04/30/18 1526    Visit Number  14    Number of Visits  25    Date for SLP Re-Evaluation  06/02/18    Authorization - Visit Number  14    Authorization - Number of Visits  27    SLP Start Time  1886    SLP Stop Time   1400    SLP Time Calculation (min)  42 min    Activity Tolerance  Patient tolerated treatment well       Past Medical History:  Diagnosis Date  . Chronic lower back pain   . Depression   . Fibromyalgia   . High cholesterol   . Seizures ([place]) 11/2017   "day after she came home from hospital after having stroke" (01/01/2018)  . Stroke [facility]) 12/06/2017   "speech issues since" (01/01/2018)  . Type II diabetes mellitus ([place])     Past Surgical History:  Procedure Laterality Date  . BACK SURGERY    . CHOLECYSTECTOMY    . INSERT / REPLACE / REMOVE PACEMAKER  01/01/2018  . IR CT HEAD LTD  12/06/2017  . IR PERCUTANEOUS ART THROMBECTOMY/INFUSION INTRACRANIAL INC DIAG ANGIO  12/06/2017  . LOOP RECORDER INSERTION N/A 12/12/2017   Procedure: LOOP RECORDER INSERTION;  Surgeon: Constance Haw, MD;  Location: [place] [facility];  Service: Cardiovascular;  Laterality: N/A;  . LOOP RECORDER REMOVAL  01/01/2018  . LOOP RECORDER REMOVAL N/A 01/01/2018   Procedure: LOOP RECORDER REMOVAL;  Surgeon: Constance Haw, MD;  Location: [place] [facility];  Service: Cardiovascular;  Laterality: N/A;  . PACEMAKER IMPLANT N/A 01/01/2018   Procedure: PACEMAKER IMPLANT;  Surgeon: Constance Haw, MD;  Location: [place] [facility];  Service: Cardiovascular;  Laterality: N/A;  . RADIOLOGY WITH ANESTHESIA N/A 12/06/2017   Procedure:  CODE STROKE;  Surgeon: Luanne Bras, MD;  Location: [place];  Service: Radiology;  Laterality: N/A;  . TEE WITHOUT CARDIOVERSION N/A 12/10/2017   Procedure: TRANSESOPHAGEAL ECHOCARDIOGRAM (TEE);  Surgeon: Jerline Pain, MD;  Location: [facility] ENDOSCOPY;  Service: Cardiovascular;  Laterality: N/A;  . THROMBECTOMY FEMORAL ARTERY Right 12/07/2017   Procedure: THROMBECTOMY RIGHT FEMORAL ARTERY;  Surgeon: Waynetta Sandy, MD;  Location: [place];  Service: Vascular;  Laterality: Right;  . TONSILLECTOMY      There were no vitals filed for this visit.  Subjective Assessment - 04/30/18 1512    Subjective  "Still trying to get it everyday."    Patient is accompained by:  Family member   Joey   Currently in Pain?  No/denies            ADULT SLP TREATMENT - 04/30/18 1318      General Information   Behavior/Cognition  Alert;Cooperative;Pleasant mood      Treatment Provided   Treatment provided  Cognitive-Linquistic      Cognitive-Linquistic Treatment   Treatment focused on  Aphasia    Skilled Treatment  SLP reviewed pt's homework (unscrambling sentences) with her, which was incomplete; 73% accurate. With cues for double checking, pt ID'd 3/4 errors however occasional mod A written, question cues required to correct. SLP worked with pt on sentence production, word-finding and verbal  expression; pt ID'd appropriate subject for given sentences from f:3, and then generated SVO sentences with a given subject or object with occasional mod A, extended time. Simple conversation, with question cues required 50% of the time for correcting paraphasias.       Assessment / Recommendations / Plan   Plan  Continue with current plan of care      Progression Toward Goals   Progression toward goals  Progressing toward goals       SLP Education - 04/30/18 1525    Education Details  suggested activities for [email or web address]    Person(s) Educated  Patient    Methods  Explanation    Comprehension   Verbalized understanding       SLP Short Term Goals - 04/30/18 1527      SLP SHORT TERM GOAL #1   Title  Pt will comprehend 3-4 phrase  description to ID or name object described 8/10x with occasional repetition and occasional min A    Status  Achieved      SLP SHORT TERM GOAL #2   Title  Pt will give 2-3 word phrase for a given word or picture with occasional min-mod A over 2 sessions.     Status  Achieved      SLP SHORT TERM GOAL #3   Title  Pt will name 5 items in a simple category with occasional min-mod A over 2 sessions    Status  Partially Met      SLP SHORT TERM GOAL #4   Title  Pt will ID aphasic errors in naming and sentence generation tasks, stop and attempt to self correct 5/10x iwth occasional min A    Status  Achieved      SLP SHORT TERM GOAL #5   Title  Pt will write a word to name an object or picture, with occasional min A for error awareness    Status  On-going       SLP Long Term Goals - 04/30/18 1527      SLP LONG TERM GOAL #1   Title  Pt will comprehend 8 minute simlple conversation with occasional min gestrual cues and repetition over 2 sessions    Time  6    Period  Weeks    Status  On-going      SLP LONG TERM GOAL #2   Title  Pt will ID aphasic errors/press of speech in simple conversation with occasional min A over 2 sessions    Time  6    Period  Weeks    Status  On-going      SLP LONG TERM GOAL #3   Title  Pt will comprehend 2-3 simple paragraph passage with minimal extended time and 80% accuracy    Time  6    Period  Weeks    Status  On-going      SLP LONG TERM GOAL #4   Title  Pt will generate functional written tasks (lists, checks, schedules, short email/letter ) with error ID 3/5 errors and occasional min A.    Time  6    Period  Weeks    Status  On-going       Plan - 04/30/18 1526    Clinical Impression Statement  Pt continues to present with moderate aphasia affecting her receptive and expressive language. Sentence level  tasks and simple conversation again exhibit signficant paraphasias and neologism today, affecting her ability to communicate with family and friends. Continue skilled ST to maximize communication  for improved independence and to reduce caregiver burden    Speech Therapy Frequency  2x / week    Treatment/Interventions  SLP instruction and feedback;Environmental controls;Compensatory techniques;Cognitive reorganization;Functional tasks;Compensatory strategies;Patient/family education;Multimodal communcation approach;Internal/external aids;Language facilitation    Potential to Achieve Goals  Good    Potential Considerations  Severity of impairments    Consulted and Agree with Plan of Care  Patient;Family member/caregiver    Family Member Consulted  son Joey       Patient will benefit from skilled therapeutic intervention in order to improve the following deficits and impairments:   Aphasia    Problem List Patient Active Problem List   Diagnosis Date Noted  . Non compliance with medical treatment 03/23/2018  . History of tobacco use disorder 01/01/2018  . Syncope and collapse 01/01/2018  . 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 Acute right hemiparesis ([place]) 12/12/2017  . History of completed stroke 12/12/2017  . Leukocytosis 12/12/2017  . Hypokalemia 12/12/2017  . Hypotension   . 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 Poor compliance with CPAP treatment 03/04/2017  . OSA and COPD overlap syndrome ([place]) 03/04/2017  . Simple chronic bronchitis ([place]) 03/04/2017  . MCI (mild cognitive impairment) 11/27/2016  . Left sided lacunar infarction ([place]) 03/11/2014  . Cerebrovascular small vessel disease 03/11/2014  . Insomnia, idiopathic 12/20/2013  . Snoring 12/06/2013  . Essential hypertension, benign 10/26/2012  . Hyperlipidemia LDL  goal <70 10/26/2012  . Polypharmacy 10/26/2012  . DM (diabetes mellitus) ([place]) 10/13/2011  . Tremor 10/13/2011  . GERD (gastroesophageal reflux disease) 10/13/2011  . Depression 10/13/2011   Deneise Lever, [place], [place] 04/30/2018, 3:28 PM  [facility] [address] [place], [place], [address] Phone: [phone]   Fax:  [phone]   Name: Brittany [name] MRN: [id number] Date of Birth: 01/24/1961

## 2018-05-04 ENCOUNTER — Ambulatory Visit (INDEPENDENT_AMBULATORY_CARE_PROVIDER_SITE_OTHER): Payer: BLUE CROSS/BLUE SHIELD | Admitting: Endocrinology

## 2018-05-04 ENCOUNTER — Encounter: Payer: Self-pay | Admitting: Endocrinology

## 2018-05-04 VITALS — BP 138/66 | HR 98 | Temp 98.6°F | Resp 16 | Wt 169.0 lb

## 2018-05-04 DIAGNOSIS — Z794 Long term (current) use of insulin: Secondary | ICD-10-CM | POA: Diagnosis not present

## 2018-05-04 DIAGNOSIS — E1149 Type 2 diabetes mellitus with other diabetic neurological complication: Secondary | ICD-10-CM | POA: Diagnosis not present

## 2018-05-04 LAB — POCT GLYCOSYLATED HEMOGLOBIN (HGB A1C): HEMOGLOBIN A1C: 8.5 % — AB (ref 4.0–5.6)

## 2018-05-04 MED ORDER — INSULIN DETEMIR 100 UNIT/ML FLEXPEN: 20.0000 [IU] | PEN_INJECTOR | Freq: Every day | SUBCUTANEOUS | 0 refills | Status: DC

## 2018-05-04 MED ORDER — INSULIN ASPART 100 UNIT/ML FLEXPEN: 13.0000 [IU] | PEN_INJECTOR | Freq: Three times a day (TID) | SUBCUTANEOUS | 11 refills | Status: DC

## 2018-05-04 NOTE — Patient Instructions (Addendum)
good diet and exercise significantly improve the control of your diabetes.  please let me know if you wish to be referred to a dietician.  high blood sugar is very risky to your health.  you should see an eye doctor and dentist every year.  It is very important to get all recommended vaccinations.  Controlling your blood pressure and cholesterol drastically reduces the damage diabetes does to your body.  Those who smoke should quit.  Please discuss these with your doctor.  check your blood sugar 4 times a day: before the 3 meals, and at bedtime.  also check if you have symptoms of your blood sugar being too high or too low.  please keep a record of the readings and bring it to your next appointment here (or you can bring the meter itself).  You can write it on any piece of paper.  please call us sooner if your blood sugar goes below 70, or if you have a lot of readings over 200.  Please decrease the levemir to 20 units at bedtime, and:  Take novolog 13 units 3 times a day (just before each meal), no matter what your blood sugar is.   Please call or message us next week, to tell us how the blood sugar is doing Please come back for a follow-up appointment in 1 month.

## 2018-05-04 NOTE — Progress Notes (Signed)
Subjective:    Patient ID: Brittany [name], female    DOB: 01-23-1961, 57 y.o.   MRN: [id number]  HPI pt is referred by Dr Clelia Croft, for diabetes.  Pt states DM was dx'ed in 1999 (she had GDM in 1987); she has mild if any neuropathy of the lower extremities; she is unaware of any associated chronic complications; she has been on insulin since early 2019; pt says her diet is good, but exercise is limited by CVA; she has never had pancreatitis, pancreatic surgery, severe hypoglycemia or DKA.  Before she had CVA in 2019, she had stopped all of her meds.  Husband gives pt her insulin, but pt is here today with her son-in-law instead.  They say they are not sure if pt gets QHS levemir.  He calls husband, who says pt gets levemir, 40 units at bedtime.  Pt also get prn novolog during the day (averages a total of approx 20 units/day).  He brings a record of his cbg's which I have reviewed today.  It varies from 91-324.  It is in general higher as the day goes on.   Past Medical History:  Diagnosis Date  . Chronic lower back pain   . Depression   . Fibromyalgia   . High cholesterol   . Seizures ([facility]) 11/2017   "day after she came home from hospital after having stroke" (01/01/2018)  . Stroke [facility]) 12/06/2017   "speech issues since" (01/01/2018)  . Type II diabetes mellitus ([facility])     Past Surgical History:  Procedure Laterality Date  . BACK SURGERY    . CHOLECYSTECTOMY    . INSERT / REPLACE / REMOVE PACEMAKER  01/01/2018  . IR CT HEAD LTD  12/06/2017  . IR PERCUTANEOUS ART THROMBECTOMY/INFUSION INTRACRANIAL INC DIAG ANGIO  12/06/2017  . LOOP RECORDER INSERTION N/A 12/12/2017   Procedure: LOOP RECORDER INSERTION;  Surgeon: Regan Lemming, MD;  [facility]: [facility];  Service: Cardiovascular;  Laterality: N/A;  . LOOP RECORDER REMOVAL  01/01/2018  . LOOP RECORDER REMOVAL N/A 01/01/2018   Procedure: LOOP RECORDER REMOVAL;  Surgeon: Regan Lemming, MD;  Location: [facility];   Service: Cardiovascular;  Laterality: N/A;  . PACEMAKER IMPLANT N/A 01/01/2018   Procedure: PACEMAKER IMPLANT;  Surgeon: Regan Lemming, MD;  Location: [facility];  Service: Cardiovascular;  Laterality: N/A;  . RADIOLOGY WITH ANESTHESIA N/A 12/06/2017   Procedure: CODE STROKE;  Surgeon: Julieanne Cotton, MD;  Location: [facility] OR;  Service: Radiology;  Laterality: N/A;  . TEE WITHOUT CARDIOVERSION N/A 12/10/2017   Procedure: TRANSESOPHAGEAL ECHOCARDIOGRAM (TEE);  Surgeon: Jake Bathe, MD;  [facility]COPY;  Service: Cardiovascular;  Laterality: N/A;  . THROMBECTOMY FEMORAL ARTERY Right 12/07/2017   Procedure: THROMBECTOMY RIGHT FEMORAL ARTERY;  Surgeon: Maeola Harman, MD;  [facility] OR;  Service: Vascular;  Laterality: Right;  . TONSILLECTOMY      Social History   Socioeconomic History  . Marital status: Married    Spouse name: [name]  . Number of children: 2  . Years of education: 62  . Highest education level: Not on file  Occupational History  . Not on file  Social Needs  . Financial resource strain: Not on file  . Food insecurity:    Worry: Not on file    Inability: Not on file  . Transportation needs:    Medical: Not on file    Non-medical: Not on file  Tobacco Use  . Smoking  status: Former Smoker    Packs/day: 1.00    Years: 38.00    Pack years: 38.00    Types: Cigarettes    Last attempt to quit: 12/06/2017    Years since quitting: 0.4  . Smokeless tobacco: Never Used  Substance and Sexual Activity  . Alcohol use: Yes    Comment: 01/01/2018 "nothing since stroke 11/2017"  . Drug use: No  . Sexual activity: Not Currently  Lifestyle  . Physical activity:    Days per week: Not on file    Minutes per session: Not on file  . Stress: Not on file  Relationships  . Social connections:    Talks on phone: Not on file    Gets together: Not on file    Attends religious service: Not on file    Active member of club or organization: Not on file      Attends meetings of clubs or organizations: Not on file    Relationship status: Not on file  . Intimate partner violence:    Fear of current or ex partner: Not on file    Emotionally abused: Not on file    Physically abused: Not on file    Forced sexual activity: Not on file  Other Topics Concern  . Not on file  Social History Narrative   Patient is married ([name]) and lives at home with her family   Patient has two children.   Patient works at [employer].   Patient has a college education.   Patient is right-handed.   Patient drinks 3 cups of coffee M-F.       Current Outpatient Medications on File Prior to Visit  Medication Sig Dispense Refill  . acetaminophen (TYLENOL) 500 MG tablet Take 1,000 mg by mouth every 6 (six) hours as needed for fever (pain).    Marland Kitchen. albuterol (PROVENTIL) (2.5 MG/3ML) 0.083% nebulizer solution Take 3 mLs (2.5 mg total) by nebulization every 6 (six) hours as needed for wheezing or shortness of breath. 150 mL 1  . ARIPiprazole (ABILIFY) 2 MG tablet Take 1 tablet (2 mg total) by mouth daily. 30 tablet 4  . aspirin EC 81 MG tablet Take 81 mg by mouth daily.    Marland Kitchen. atorvastatin (LIPITOR) 20 MG tablet Take 1 tablet (20 mg total) by mouth daily. 90 tablet 3  . B Complex-Biotin-FA (MULTI-B COMPLEX) CAPS Take one a day ,B complex  multivitamin over the counter or use prenatal vitamin. 90 capsule 0  . clopidogrel (PLAVIX) 75 MG tablet Take 1 tablet (75 mg total) by mouth daily. 30 tablet 11  . Continuous Blood Gluc Receiver (FREESTYLE LIBRE 14 DAY READER) DEVI 1 Units by Does not apply route 4 (four) times daily. 1 Device 0  . Continuous Blood Gluc Sensor (FREESTYLE LIBRE 14 DAY SENSOR) MISC Apply 1 Units topically every 14 (fourteen) days. 6 each 4  . irbesartan (AVAPRO) 150 MG tablet Take 1 tablet (150 mg total) by mouth daily. 90 tablet 1  . levETIRAcetam (KEPPRA) 500 MG tablet Take 1 tablet (500 mg total) by mouth 2 (two) times daily. 180 tablet 4  .  sertraline (ZOLOFT) 100 MG tablet Take 2 tablets (200 mg total) by mouth at bedtime. 180 tablet 3   Current Facility-Administered Medications on File Prior to Visit  Medication Dose Route Frequency Provider Last Rate Last Dose  . gadopentetate dimeglumine (MAGNEVIST) injection 15 mL  15 mL Intravenous Once PRN Dohmeier, Porfirio Mylararmen, MD  No Known Allergies  Family History  Problem Relation Age of Onset  . Cancer Mother   . Diabetes Mother   . Heart disease Mother   . Parkinsonism Mother   . Diabetes Father   . Heart disease Father   . Dementia Father   . Cancer Maternal Grandmother 62       lung  . COPD Maternal Aunt     BP 138/66   Pulse 98   Temp 98.6 F (37 C) (Oral)   Resp 16   Wt 169 lb (76.7 kg)   SpO2 96%   BMI 29.94 kg/m   Review of Systems denies weight loss, blurry vision, headache, chest pain, sob, n/v, muscle cramps, excessive diaphoresis, memory loss, depression, cold intolerance, and rhinorrhea.  She has polyuria and easy bruising.      Objective:   Physical Exam VS: see vs page GEN: no distress HEAD: head: no deformity eyes: no periorbital swelling, no proptosis external nose and ears are normal mouth: no lesion seen NECK: supple, thyroid is not enlarged CHEST WALL: no deformity LUNGS: clear to auscultation CV: reg rate and rhythm, no murmur ABD: abdomen is soft, nontender.  no hepatosplenomegaly.  not distended.  no hernia MUSCULOSKELETAL: muscle bulk and strength are grossly normal.  no obvious joint swelling.  gait is normal and steady EXTEMITIES: no deformity.  no ulcer on the feet.  feet are of normal color and temp.  no edema PULSES: dorsalis pedis intact bilat.  no carotid bruit NEURO:  cn 2-12 grossly intact, except for word-finding difficulty.   readily moves all 4's.  sensation is intact to touch on the feet.   SKIN:  Normal texture and temperature.  No rash or suspicious lesion is visible.   NODES:  None palpable at the neck PSYCH:  alert, well-oriented.  Does not appear anxious nor depressed.    Lab Results  Component Value Date   CREATININE 0.55 (L) 02/26/2018   BUN 12 02/26/2018   NA 136 02/26/2018   K 4.4 02/26/2018   CL 102 02/26/2018   CO2 21 02/26/2018    Lab Results  Component Value Date   HGBA1C 8.5 (A) 05/04/2018   I have reviewed outside records, and summarized: Pt was noted to have elevated a1c, and referred here.  When she was admitted with CVA, she was noted to have very high a1c, and was smoking.       Assessment & Plan:  Insulin-requiring type 2 DM, with CVA, new to me.  Based on the pattern of her cbg's, she needs some adjustment in her therapy  Patient Instructions  good diet and exercise significantly improve the control of your diabetes.  please let me know if you wish to be referred to a dietician.  high blood sugar is very risky to your health.  you should see an eye doctor and dentist every year.  It is very important to get all recommended vaccinations.  Controlling your blood pressure and cholesterol drastically reduces the damage diabetes does to your body.  Those who smoke should quit.  Please discuss these with your doctor.  check your blood sugar 4 times a day: before the 3 meals, and at bedtime.  also check if you have symptoms of your blood sugar being too high or too low.  please keep a record of the readings and bring it to your next appointment here (or you can bring the meter itself).  You can write it on any piece of paper.  please call us sooner if your blood sugar goes below 70, or if you have a lot of readings over 200.  Please decrease the levemir to 20 units at bedtime, and:  Take novolog 13 units 3 times a day (just before each meal), no matter what your blood sugar is.   Please call or message [place] next week, to tell us how the blood sugar is doing Please come back for a follow-up appointment in 1 month.

## 2018-05-05 ENCOUNTER — Ambulatory Visit: Payer: BLUE CROSS/BLUE SHIELD | Admitting: Speech Pathology

## 2018-05-05 DIAGNOSIS — R4701 Aphasia: Secondary | ICD-10-CM | POA: Diagnosis not present

## 2018-05-05 NOTE — Therapy (Signed)
Brittany [name] [address] [place], [place], [address] Phone: [phone]   Fax:  [phone]  Speech Language Pathology Treatment  Patient Details  Name: Brittany [name] MRN: [id number] Date of Birth: 01-24-1961 Referring Provider: Dr. Delman Cheadle   Encounter Date: 05/05/2018  End of Session - 05/05/18 1624    Visit Number  15    Number of Visits  25    Date for SLP Re-Evaluation  06/02/18    Authorization Type  [name] has 30  visit limit, has had PT - will adjust visits accordingly. [name] says [name] used 3, leaving 27, however I believe she received more HHST per pt    Authorization - Visit Number  15    Authorization - Number of Visits  27    SLP Start Time  1314    SLP Stop Time   1445    SLP Time Calculation (min)  40 min    Activity Tolerance  Patient tolerated treatment well       Past Medical History:  Diagnosis Date  . Chronic lower back pain   . Depression   . Fibromyalgia   . High cholesterol   . Seizures ([place]) 11/2017   "day after she came home from hospital after having stroke" (01/01/2018)  . Stroke [facility]) 12/06/2017   "speech issues since" (01/01/2018)  . Type II diabetes mellitus ([place])     Past Surgical History:  Procedure Laterality Date  . BACK SURGERY    . CHOLECYSTECTOMY    . INSERT / REPLACE / REMOVE PACEMAKER  01/01/2018  . IR CT HEAD LTD  12/06/2017  . IR PERCUTANEOUS ART THROMBECTOMY/INFUSION INTRACRANIAL INC DIAG ANGIO  12/06/2017  . LOOP RECORDER INSERTION N/A 12/12/2017   Procedure: LOOP RECORDER INSERTION;  Surgeon: Constance Haw, MD;  Location: [facility];  Service: Cardiovascular;  Laterality: N/A;  . LOOP RECORDER REMOVAL  01/01/2018  . LOOP RECORDER REMOVAL N/A 01/01/2018   Procedure: LOOP RECORDER REMOVAL;  Surgeon: Constance Haw, MD;  Location: [place] [facility];  Service: Cardiovascular;  Laterality: N/A;  . PACEMAKER IMPLANT N/A 01/01/2018   Procedure:  PACEMAKER IMPLANT;  Surgeon: Constance Haw, MD;  Location: [place] [facility];  Service: Cardiovascular;  Laterality: N/A;  . RADIOLOGY WITH ANESTHESIA N/A 12/06/2017   Procedure: CODE STROKE;  Surgeon: Luanne Bras, MD;  Location: [facility];  Service: Radiology;  Laterality: N/A;  . TEE WITHOUT CARDIOVERSION N/A 12/10/2017   Procedure: TRANSESOPHAGEAL ECHOCARDIOGRAM (TEE);  Surgeon: Jerline Pain, MD;  Location: [facility]OS[facility];  Service: Cardiovascular;  Laterality: N/A;  . THROMBECTOMY FEMORAL ARTERY Right 12/07/2017   Procedure: THROMBECTOMY RIGHT FEMORAL ARTERY;  Surgeon: Waynetta Sandy, MD;  Location: [facility];  Service: Vascular;  Laterality: Right;  . TONSILLECTOMY      There were no vitals filed for this visit.  Subjective Assessment - 05/05/18 1407    Subjective  "I can't get it to do nothing." pt gets out laptop    Currently in Pain?  No/denies            ADULT SLP TREATMENT - 05/05/18 1400      General Information   Behavior/Cognition  Alert;Cooperative;Pleasant mood      Treatment Provided   Treatment provided  Cognitive-Linquistic      Cognitive-Linquistic Treatment   Treatment focused on  Aphasia    Skilled Treatment  Pt arrived with laptop, requesting assistance with TalkPath therapy application. SLP worked with  pt to add appropriate activities for home practice. Simple object naming 70% accuracy, with occasional min cues to use gestures, writing or description. Mod A multimodal cues required for verb ID; SLP noted increased difficulty with more abstract icons/images. Following 2 step directions, 100% accuracy with repetition x1. Pt comprehension of 8 min simple conversation today was functional with occasional repetition. Occasional min A for multimodal communication; writing was effective x3 for correcting verbal paraphasias/dysnomia.       Assessment / Recommendations / Plan   Plan  Continue with current plan of care      Progression Toward Goals    Progression toward goals  Progressing toward goals         SLP Short Term Goals - 05/05/18 1612      SLP SHORT TERM GOAL #1   Title  Pt will comprehend 3-4 phrase  description to ID or name object described 8/10x with occasional repetition and occasional min A    Status  Achieved      SLP SHORT TERM GOAL #2   Title  Pt will give 2-3 word phrase for a given word or picture with occasional min-mod A over 2 sessions.     Status  Achieved      SLP SHORT TERM GOAL #3   Title  Pt will name 5 items in a simple category with occasional min-mod A over 2 sessions    Status  Partially Met      SLP SHORT TERM GOAL #4   Title  Pt will ID aphasic errors in naming and sentence generation tasks, stop and attempt to self correct 5/10x iwth occasional min A    Status  Achieved      SLP SHORT TERM GOAL #5   Title  Pt will write a word to name an object or picture, with occasional min A for error awareness    Status  Partially Met       SLP Long Term Goals - 05/05/18 1613      SLP LONG TERM GOAL #1   Title  Pt will comprehend 8 minute simlple conversation with occasional min gestural cues and repetition over 2 sessions    Baseline  05/05/18    Time  5    Period  Weeks    Status  On-going      SLP LONG TERM GOAL #2   Title  Pt will ID aphasic errors/press of speech in simple conversation with occasional min A over 2 sessions    Time  5    Period  Weeks    Status  On-going      SLP LONG TERM GOAL #3   Title  Pt will comprehend 2-3 simple paragraph passage with minimal extended time and 80% accuracy    Time  5    Period  Weeks    Status  On-going      SLP LONG TERM GOAL #4   Title  Pt will generate functional written tasks (lists, checks, schedules, short email/letter ) with error ID 3/5 errors and occasional min A.    Time  5    Period  Weeks    Status  On-going       Plan - 05/05/18 1625    Clinical Impression Statement  Pt continues to present with moderate aphasia affecting  her receptive and expressive language. Sentence level tasks and simple conversation again exhibit significant paraphasias and neologisms today, affecting her ability to communicate with family and friends. Continue skilled ST  to maximize communication for improved independence and to reduce caregiver burden    Speech Therapy Frequency  2x / week    Treatment/Interventions  SLP instruction and feedback;Environmental controls;Compensatory techniques;Cognitive reorganization;Functional tasks;Compensatory strategies;Patient/family education;Multimodal communcation approach;Internal/external aids;Language facilitation    Potential to Achieve Goals  Good    Potential Considerations  Severity of impairments    Consulted and Agree with Plan of Care  Patient       Patient will benefit from skilled therapeutic intervention in order to improve the following deficits and impairments:   Aphasia    Problem List Patient Active Problem List   Diagnosis Date Noted  . Non compliance with medical treatment 03/23/2018  . History of tobacco use disorder 01/01/2018  . Syncope and collapse 01/01/2018  . 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 Acute right hemiparesis ([place]) 12/12/2017  . History of completed stroke 12/12/2017  . Leukocytosis 12/12/2017  . Hypokalemia 12/12/2017  . Hypotension   . 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 Poor compliance with CPAP treatment 03/04/2017  . OSA and COPD overlap syndrome ([place]) 03/04/2017  . Simple chronic bronchitis ([place]) 03/04/2017  . MCI (mild cognitive impairment) 11/27/2016  . Left sided lacunar infarction ([place]) 03/11/2014  . Cerebrovascular small vessel disease 03/11/2014  . Insomnia, idiopathic 12/20/2013  . Snoring 12/06/2013  . Essential hypertension, benign 10/26/2012  . Hyperlipidemia LDL goal <70  10/26/2012  . Polypharmacy 10/26/2012  . DM (diabetes mellitus) ([place]) 10/13/2011  . Tremor 10/13/2011  . GERD (gastroesophageal reflux disease) 10/13/2011  . Depression 10/13/2011   Deneise Lever, [place], [place] Speech-Language Pathologist   Brittany [name] 05/05/2018, 4:26 PM  [facility] [address] [place], [place], [address] Phone: [phone]   Fax:  [phone]   Name: Brittany [name] MRN: [id number] Date of Birth: 05-14-1961

## 2018-05-07 ENCOUNTER — Ambulatory Visit: Payer: BLUE CROSS/BLUE SHIELD | Admitting: Speech Pathology

## 2018-05-07 ENCOUNTER — Encounter: Payer: Self-pay | Admitting: Speech Pathology

## 2018-05-07 DIAGNOSIS — R4701 Aphasia: Secondary | ICD-10-CM | POA: Diagnosis not present

## 2018-05-07 NOTE — Patient Instructions (Addendum)
  Please call Dr. Pearlean BrownieSethi about your tremors and let him know how bad they are   Write your grocery lists

## 2018-05-07 NOTE — Therapy (Signed)
[facility] [address] [place], [place], [address] Phone: [phone]   Fax:  [phone]  Speech Language Pathology Treatment  Patient Details  Name: Brittany [name] MRN: [id number] Date of Birth: 03-11-1961 Referring Provider: Dr. Delman Cheadle   Encounter Date: 05/07/2018  End of Session - 05/07/18 1501    Visit Number  16    Number of Visits  25    Date for SLP Re-Evaluation  06/02/18    Authorization Type  [place] has 30  visit limit, has had PT - will adjust visits accordingly. [name] says HHST used 3, leaving 27, however I believe she received more HHST per pt    Authorization Time Period  I initally scheduled for 2x a week for 8 weeks, will add on visists per POC as indicated    Authorization - Visit Number  16    Authorization - Number of Visits  27    SLP Start Time  7616    SLP Stop Time   1446    SLP Time Calculation (min)  44 min    Activity Tolerance  Patient tolerated treatment well       Past Medical History:  Diagnosis Date  . Chronic lower back pain   . Depression   . Fibromyalgia   . High cholesterol   . Seizures ([place]) 11/2017   "day after she came home from hospital after having stroke" (01/01/2018)  . Stroke [facility] 12/06/2017   "speech issues since" (01/01/2018)  . Type II diabetes mellitus ([place])     Past Surgical History:  Procedure Laterality Date  . BACK SURGERY    . CHOLECYSTECTOMY    . INSERT / REPLACE / REMOVE PACEMAKER  01/01/2018  . IR CT HEAD LTD  12/06/2017  . IR PERCUTANEOUS ART THROMBECTOMY/INFUSION INTRACRANIAL INC DIAG ANGIO  12/06/2017  . LOOP RECORDER INSERTION N/A 12/12/2017   Procedure: LOOP RECORDER INSERTION;  Surgeon: Constance Haw, MD;  Location: [facility];  Service: Cardiovascular;  Laterality: N/A;  . LOOP RECORDER REMOVAL  01/01/2018  . LOOP RECORDER REMOVAL N/A 01/01/2018   Procedure: LOOP RECORDER REMOVAL;  Surgeon: Constance Haw, MD;  Location:  [facility];  Service: Cardiovascular;  Laterality: N/A;  . PACEMAKER IMPLANT N/A 01/01/2018   Procedure: PACEMAKER IMPLANT;  Surgeon: Constance Haw, MD;  Location: [facility];  Service: Cardiovascular;  Laterality: N/A;  . RADIOLOGY WITH ANESTHESIA N/A 12/06/2017   Procedure: CODE STROKE;  Surgeon: Luanne Bras, MD;  Location: [facility];  Service: Radiology;  Laterality: N/A;  . TEE WITHOUT CARDIOVERSION N/A 12/10/2017   Procedure: TRANSESOPHAGEAL ECHOCARDIOGRAM (TEE);  Surgeon: Jerline Pain, MD;  Location: [facility] ENDOSCOPY;  Service: Cardiovascular;  Laterality: N/A;  . THROMBECTOMY FEMORAL ARTERY Right 12/07/2017   Procedure: THROMBECTOMY RIGHT FEMORAL ARTERY;  Surgeon: Waynetta Sandy, MD;  Location: [facility];  Service: Vascular;  Laterality: Right;  . TONSILLECTOMY      There were no vitals filed for this visit.  Subjective Assessment - 05/07/18 1451    Subjective  Pt arrives with significant UE and head tremors            ADULT SLP TREATMENT - 05/07/18 1422      General Information   Behavior/Cognition  Alert;Cooperative;Pleasant mood      Treatment Provided   Treatment provided  Cognitive-Linquistic      Cognitive-Linquistic Treatment   Treatment focused on  Aphasia    Skilled Treatment  Pt arrives with significant UE tremors affecting ability to write and hold paper. Functional written expression with check writing with mod I for error correction, check writing 95% accurate with supervision cues. Functional written expression and word finding with writing grocery list for meal planning/recipe with rare min A for naming, occasional min A to ID written/spelling errors. Pt IDing verbal errors (paraphasias, neologims) with occasional min A. Pt conversing over 8 minutes functional with min questioning cues. I brought her spouse back for last 15 minute of session to discuss concerns for increased tremor and to encourage Brittany [name] to continue chores, activities  she did prior to her stroke.       Assessment / Recommendations / Plan   Plan  Continue with current plan of care      Progression Toward Goals   Progression toward goals  Progressing toward goals         SLP Short Term Goals - 05/07/18 1500      SLP SHORT TERM GOAL #1   Title  Pt will comprehend 3-4 phrase  description to ID or name object described 8/10x with occasional repetition and occasional min A    Status  Achieved      SLP SHORT TERM GOAL #2   Title  Pt will give 2-3 word phrase for a given word or picture with occasional min-mod A over 2 sessions.     Status  Achieved      SLP SHORT TERM GOAL #3   Title  Pt will name 5 items in a simple category with occasional min-mod A over 2 sessions    Status  Partially Met      SLP SHORT TERM GOAL #4   Title  Pt will ID aphasic errors in naming and sentence generation tasks, stop and attempt to self correct 5/10x iwth occasional min A    Status  Achieved      SLP SHORT TERM GOAL #5   Title  Pt will write a word to name an object or picture, with occasional min A for error awareness    Status  Partially Met       SLP Long Term Goals - 05/07/18 1501      SLP LONG TERM GOAL #1   Title  Pt will comprehend 8 minute simlple conversation with occasional min gestural cues and repetition over 2 sessions    Baseline  05/05/18; 05/07/18    Time  5    Period  Weeks    Status  Achieved      SLP LONG TERM GOAL #2   Title  Pt will ID aphasic errors/press of speech in simple conversation with occasional min A over 2 sessions    Baseline  05/07/18;     Time  5    Period  Weeks    Status  On-going      SLP LONG TERM GOAL #3   Title  Pt will comprehend 2-3 simple paragraph passage with minimal extended time and 80% accuracy    Time  5    Period  Weeks    Status  On-going      SLP LONG TERM GOAL #4   Title  Pt will generate functional written tasks (lists, checks, schedules, short email/letter ) with error ID 3/5 errors and  occasional min A.    Time  5    Period  Weeks    Status  On-going       Plan - 05/07/18 1502  Clinical Impression Statement  Pt continues to present with moderate aphasia affecting her receptive and expressive language. Improvement in Sentence level tasks and simple conversation, with some improved awareness of aphasic errors. Functional written expression with min A at word level. Moderate fluent aphasia persists affecting her ability to communicate with family and friends. Continue skilled ST to maximize communication for improved independence and to reduce caregiver burden    Pt's tremor is increased today - she reports difficulty eating due to UE tremor. Instructed pt and her husband to call neurology re: increased amplitude of B UE tremor.    Speech Therapy Frequency  2x / week    Treatment/Interventions  SLP instruction and feedback;Environmental controls;Compensatory techniques;Cognitive reorganization;Functional tasks;Compensatory strategies;Patient/family education;Multimodal communcation approach;Internal/external aids;Language facilitation    Potential to Achieve Goals  Good    Potential Considerations  Severity of impairments    Consulted and Agree with Plan of Care  Patient       Patient will benefit from skilled therapeutic intervention in order to improve the following deficits and impairments:   Aphasia    Problem List Patient Active Problem List   Diagnosis Date Noted  . Non compliance with medical treatment 03/23/2018  . History of tobacco use disorder 01/01/2018  . Syncope and collapse 01/01/2018  . 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 Acute right hemiparesis ([place]) 12/12/2017  . History of completed stroke 12/12/2017  . Leukocytosis 12/12/2017  . Hypokalemia 12/12/2017  . Hypotension   . Middle cerebral artery  embolism, left 12/06/2017  . Poor compliance with CPAP treatment 03/04/2017  . OSA and COPD overlap syndrome ([place]) 03/04/2017  . Simple chronic bronchitis ([place]) 03/04/2017  . MCI (mild cognitive impairment) 11/27/2016  . Left sided lacunar infarction ([place]) 03/11/2014  . Cerebrovascular small vessel disease 03/11/2014  . Insomnia, idiopathic 12/20/2013  . Snoring 12/06/2013  . Essential hypertension, benign 10/26/2012  . Hyperlipidemia LDL goal <70 10/26/2012  . Polypharmacy 10/26/2012  . DM (diabetes mellitus) ([place]) 10/13/2011  . Tremor 10/13/2011  . GERD (gastroesophageal reflux disease) 10/13/2011  . Depression 10/13/2011    Santonio Speakman, Annye Rusk MS, CCC-SLP 05/07/2018, 3:04 PM  [facility] [address] [place], [place], [address] Phone: [phone]   Fax:  [phone]   Name: Brittany [name] MRN: [id number] Date of Birth: Feb 03, 1961

## 2018-05-12 ENCOUNTER — Encounter: Payer: Self-pay | Admitting: Speech Pathology

## 2018-05-12 ENCOUNTER — Ambulatory Visit: Payer: Self-pay | Attending: Family Medicine | Admitting: Speech Pathology

## 2018-05-12 DIAGNOSIS — R4701 Aphasia: Secondary | ICD-10-CM | POA: Insufficient documentation

## 2018-05-12 NOTE — Therapy (Signed)
[facility] [address] [place], [place], [address] Phone: [phone]   Fax:  [phone]  Speech Language Pathology Treatment  Patient Details  Name: Brittany [name] MRN: [id number] Date of Birth: May 30, 1961 Referring Provider: Dr. Delman Cheadle   Encounter Date: 05/12/2018  End of Session - 05/12/18 1453    Visit Number  17    Number of Visits  25    Date for SLP Re-Evaluation  06/02/18    Authorization Type  [facility] has 30  visit limit, has had PT - will adjust visits accordingly. [name] says [place] used 3, leaving 27, however I believe she received more HHST per pt    Authorization Time Period  I initally scheduled for 2x a week for 8 weeks, will add on visists per POC as indicated    Authorization - Visit Number  17    Authorization - Number of Visits  27    SLP Start Time  1401    SLP Stop Time   1445    SLP Time Calculation (min)  44 min    Activity Tolerance  Patient tolerated treatment well       Past Medical History:  Diagnosis Date  . Chronic lower back pain   . Depression   . Fibromyalgia   . High cholesterol   . Seizures ([place]) 11/2017   "day after she came home from hospital after having stroke" (01/01/2018)  . Stroke [facility]) 12/06/2017   "speech issues since" (01/01/2018)  . Type II diabetes mellitus ([place])     Past Surgical History:  Procedure Laterality Date  . BACK SURGERY    . CHOLECYSTECTOMY    . INSERT / REPLACE / REMOVE PACEMAKER  01/01/2018  . IR CT HEAD LTD  12/06/2017  . IR PERCUTANEOUS ART THROMBECTOMY/INFUSION INTRACRANIAL INC DIAG ANGIO  12/06/2017  . LOOP RECORDER INSERTION N/A 12/12/2017   Procedure: LOOP RECORDER INSERTION;  Surgeon: Constance Haw, MD;  Location: [facility];  Service: Cardiovascular;  Laterality: N/A;  . LOOP RECORDER REMOVAL  01/01/2018  . LOOP RECORDER REMOVAL N/A 01/01/2018   Procedure: LOOP RECORDER REMOVAL;  Surgeon: Constance Haw, MD;  Location: [facility];  Service: Cardiovascular;  Laterality: N/A;  . PACEMAKER IMPLANT N/A 01/01/2018   Procedure: PACEMAKER IMPLANT;  Surgeon: Constance Haw, MD;  Location: [facility];  Service: Cardiovascular;  Laterality: N/A;  . RADIOLOGY WITH ANESTHESIA N/A 12/06/2017   Procedure: CODE STROKE;  Surgeon: Luanne Bras, MD;  Location: [facility];  Service: Radiology;  Laterality: N/A;  . TEE WITHOUT CARDIOVERSION N/A 12/10/2017   Procedure: TRANSESOPHAGEAL ECHOCARDIOGRAM (TEE);  Surgeon: Jerline Pain, MD;  Location: [facility] ENDOS[facility];  Service: Cardiovascular;  Laterality: N/A;  . THROMBECTOMY FEMORAL ARTERY Right 12/07/2017   Procedure: THROMBECTOMY RIGHT FEMORAL ARTERY;  Surgeon: Waynetta Sandy, MD;  Location: [facility];  Service: Vascular;  Laterality: Right;  . TONSILLECTOMY      There were no vitals filed for this visit.  Subjective Assessment - 05/12/18 1404    Subjective  "I don't like it  - it isn't want to go right" re: HW    Currently in Pain?  No/denies            ADULT SLP TREATMENT - 05/12/18 1405      General Information   Behavior/Cognition  Alert;Cooperative;Pleasant mood      Treatment Provided   Treatment provided  Cognitive-Linquistic  Cognitive-Linquistic Treatment   Treatment focused on  Aphasia;Patient/family/caregiver education    Skilled Treatment  Targeted functional reading of menu today, choosingn items, adding prices and tip with mod I. Reading comprehension of 3 paragraph magazine article with supervision and rare min questioning cues. Pt IDing verbal errors in conversation and structured speech tasks 4 errors. Pt participated in conversation with occasional questioning cues to clarify. Pt comprehended ST conversation with mod I and mildly slower rate of speech.      Assessment / Recommendations / Plan   Plan  Continue with current plan of care      Progression Toward Goals   Progression toward goals  Progressing toward goals          SLP Short Term Goals - 05/12/18 1452      SLP SHORT TERM GOAL #1   Title  Pt will comprehend 3-4 phrase  description to ID or name object described 8/10x with occasional repetition and occasional min A    Status  Achieved      SLP SHORT TERM GOAL #2   Title  Pt will give 2-3 word phrase for a given word or picture with occasional min-mod A over 2 sessions.     Status  Achieved      SLP SHORT TERM GOAL #3   Title  Pt will name 5 items in a simple category with occasional min-mod A over 2 sessions    Status  Partially Met      SLP SHORT TERM GOAL #4   Title  Pt will ID aphasic errors in naming and sentence generation tasks, stop and attempt to self correct 5/10x iwth occasional min A    Status  Achieved      SLP SHORT TERM GOAL #5   Title  Pt will write a word to name an object or picture, with occasional min A for error awareness    Status  Partially Met       SLP Long Term Goals - 05/12/18 1452      SLP LONG TERM GOAL #1   Title  Pt will comprehend 8 minute simlple conversation with occasional min gestural cues and repetition over 2 sessions    Baseline  05/05/18; 05/07/18; 05/12/18    Time  5    Period  Weeks    Status  Achieved      SLP LONG TERM GOAL #2   Title  Pt will ID aphasic errors/press of speech in simple conversation with occasional min A over 2 sessions    Baseline  05/07/18;     Time  4    Period  Weeks    Status  On-going      SLP LONG TERM GOAL #3   Title  Pt will comprehend 2-3 simple paragraph passage with minimal extended time and 80% accuracy    Baseline  05/12/18    Time  5    Period  Weeks    Status  Achieved      SLP LONG TERM GOAL #4   Title  Pt will generate functional written tasks (lists, checks, schedules, short email/letter ) with error ID 3/5 errors and occasional min A.    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 Reading comprhension of magazine artilcle 85% with  rare min A. Fluent aphasia continues to affect pt's ability to communicate effectively with family, friends and in the community. Cotnintue skilled ST to maximize communication for improved independence, safety and QOL    Speech Therapy Frequency  2x / week    Duration  --   12 weeks or 25 visits   Treatment/Interventions  SLP instruction and feedback;Environmental controls;Compensatory techniques;Cognitive reorganization;Functional tasks;Compensatory strategies;Patient/family education;Multimodal communcation approach;Internal/external aids;Language facilitation    Potential to Achieve Goals  Good    Potential Considerations  Severity of impairments       Patient will benefit from skilled therapeutic intervention in order to improve the following deficits and impairments:   Aphasia    Problem List Patient Active Problem List   Diagnosis Date Noted  . Non compliance with medical treatment 03/23/2018  . History of tobacco use disorder 01/01/2018  . Syncope and collapse 01/01/2018  . 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 Acute right hemiparesis ([place]) 12/12/2017  . History of completed stroke 12/12/2017  . Leukocytosis 12/12/2017  . Hypokalemia 12/12/2017  . Hypotension   . 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 Poor compliance with CPAP treatment 03/04/2017  . OSA and COPD overlap syndrome ([place]) 03/04/2017  . Simple chronic bronchitis ([place]) 03/04/2017  . MCI (mild cognitive impairment) 11/27/2016  . Left sided lacunar infarction ([place]) 03/11/2014  . Cerebrovascular small vessel disease 03/11/2014  . Insomnia, idiopathic 12/20/2013  . Snoring 12/06/2013  . Essential hypertension, benign 10/26/2012  . Hyperlipidemia LDL goal <70 10/26/2012  .  Polypharmacy 10/26/2012  . DM (diabetes mellitus) ([place]) 10/13/2011  . Tremor 10/13/2011  . GERD (gastroesophageal reflux disease) 10/13/2011  . Depression 10/13/2011    Chrishauna Mee, Annye Rusk MS, CCC-SLP 05/12/2018, 2:54 PM  [facility] [address] [place], [place], [address] Phone: [phone]   Fax:  [phone]   Name: Brittany [name] MRN: [id number] Date of Birth: March 21, 1961

## 2018-05-12 NOTE — Telephone Encounter (Signed)
RN call [facility] Outpatient therapy and spoke with Raynelle Fanning. Rn stated their office does functional capacity evaluation forms as one time visit.Raynelle Fanning stated to fax the form over to see if their therapist can evaluation patient. Raynelle Fanning stated they have a form for functional capacity evaluation. She recommend I fax over the pts form, and see if its the same form. Form fax to [phone]. Form receive.

## 2018-05-12 NOTE — Telephone Encounter (Signed)
Noted! Thank you

## 2018-05-13 ENCOUNTER — Other Ambulatory Visit: Payer: Self-pay

## 2018-05-13 DIAGNOSIS — R4701 Aphasia: Secondary | ICD-10-CM

## 2018-05-13 DIAGNOSIS — I639 Cerebral infarction, unspecified: Secondary | ICD-10-CM

## 2018-05-13 NOTE — Telephone Encounter (Signed)
RN call Brittany [name] at [facility] outpatient therapy on [address]. She stated the fax was never receive. RN refax form to [phone]. Brittany [name] stated the functional capacity looks similar to the form they do a their office. Brittany [name] stated she had the form now, and will give [place] a call back. If the form is similar a referral can be put in once approve.

## 2018-05-13 NOTE — Telephone Encounter (Signed)
Receive call from Amil Amen about the pt disability form required functional capacity form to be done. Amil Amen reviewed the form of the pt. She stated the form is similar to assessment form that is done by the therapist. The therapist does not fill the form out. They do the assessment based off the form, and its document in a note. The MD can fill the form out based off the therapist note.A referral will be needed, and comments should show what we needed. The pt will be call once the referral is sent. Rn verbalized understanding.

## 2018-05-14 ENCOUNTER — Ambulatory Visit: Payer: Self-pay | Admitting: Speech Pathology

## 2018-05-14 ENCOUNTER — Encounter: Payer: Self-pay | Admitting: Speech Pathology

## 2018-05-14 DIAGNOSIS — R4701 Aphasia: Secondary | ICD-10-CM

## 2018-05-14 NOTE — Therapy (Signed)
[facility] [address] [place], [place], [address] Phone: [phone]   Fax:  [phone]  Speech Language Pathology Treatment  Patient Details  Name: Brittany [name] MRN: [id number] Date of Birth: 03/26/1961 Referring Provider: Dr. Delman Cheadle   Encounter Date: 05/14/2018  End of Session - 05/14/18 1452    Visit Number  18    Number of Visits  25    Date for SLP Re-Evaluation  06/02/18    Authorization Type  [place] has 30  visit limit, has had PT - will adjust visits accordingly. [name] says [place] used 3, leaving 27, however I believe she received more HHST per pt    Authorization - Visit Number  18    Authorization - Number of Visits  27    SLP Start Time  1351    SLP Stop Time   7253    SLP Time Calculation (min)  51 min    Activity Tolerance  Patient tolerated treatment well       Past Medical History:  Diagnosis Date  . Chronic lower back pain   . Depression   . Fibromyalgia   . High cholesterol   . Seizures ([place]) 11/2017   "day after she came home from hospital after having stroke" (01/01/2018)  . Stroke [facility]) 12/06/2017   "speech issues since" (01/01/2018)  . Type II diabetes mellitus ([place])     Past Surgical History:  Procedure Laterality Date  . BACK SURGERY    . CHOLECYSTECTOMY    . INSERT / REPLACE / REMOVE PACEMAKER  01/01/2018  . IR CT HEAD LTD  12/06/2017  . IR PERCUTANEOUS ART THROMBECTOMY/INFUSION INTRACRANIAL INC DIAG ANGIO  12/06/2017  . LOOP RECORDER INSERTION N/A 12/12/2017   Procedure: LOOP RECORDER INSERTION;  Surgeon: Constance Haw, MD;  Location: [facility];  Service: Cardiovascular;  Laterality: N/A;  . LOOP RECORDER REMOVAL  01/01/2018  . LOOP RECORDER REMOVAL N/A 01/01/2018   Procedure: LOOP RECORDER REMOVAL;  Surgeon: Constance Haw, MD;  Location: [facility];  Service: Cardiovascular;  Laterality: N/A;  . PACEMAKER IMPLANT N/A 01/01/2018   Procedure:  PACEMAKER IMPLANT;  Surgeon: Constance Haw, MD;  Location: [facility];  Service: Cardiovascular;  Laterality: N/A;  . RADIOLOGY WITH ANESTHESIA N/A 12/06/2017   Procedure: CODE STROKE;  Surgeon: Luanne Bras, MD;  Location: [facility];  Service: Radiology;  Laterality: N/A;  . TEE WITHOUT CARDIOVERSION N/A 12/10/2017   Procedure: TRANSESOPHAGEAL ECHOCARDIOGRAM (TEE);  Surgeon: Jerline Pain, MD;  Location: [facility] ENDOSCOPY;  Service: Cardiovascular;  Laterality: N/A;  . THROMBECTOMY FEMORAL ARTERY Right 12/07/2017   Procedure: THROMBECTOMY RIGHT FEMORAL ARTERY;  Surgeon: Waynetta Sandy, MD;  Location: [facility];  Service: Vascular;  Laterality: Right;  . TONSILLECTOMY      There were no vitals filed for this visit.  Subjective Assessment - 05/14/18 1429    Subjective  "I did it alright"    Patient is accompained by:  Family member   Joey   Currently in Pain?  No/denies            ADULT SLP TREATMENT - 05/14/18 1449      General Information   Behavior/Cognition  Alert;Cooperative;Pleasant mood      Treatment Provided   Treatment provided  Cognitive-Linquistic      Cognitive-Linquistic Treatment   Treatment focused on  Aphasia;Patient/family/caregiver education    Skilled Treatment  Functional writing and word finding grocery lists  with specified categories with rare min A for error awareness, Mod A for error corretion. Son reports improved texting/spelling. Pt ID'd verbal errors and attempted to self correct with rare min A 7/10 errors in conversation and structured tasks.      Assessment / Recommendations / Plan   Plan  Continue with current plan of care      Progression Toward Goals   Progression toward goals  Progressing toward goals         SLP Short Term Goals - 05/14/18 1451      SLP SHORT TERM GOAL #1   Title  Pt will comprehend 3-4 phrase  description to ID or name object described 8/10x with occasional repetition and occasional min A     Status  Achieved      SLP SHORT TERM GOAL #2   Title  Pt will give 2-3 word phrase for a given word or picture with occasional min-mod A over 2 sessions.     Status  Achieved      SLP SHORT TERM GOAL #3   Title  Pt will name 5 items in a simple category with occasional min-mod A over 2 sessions    Status  Partially Met      SLP SHORT TERM GOAL #4   Title  Pt will ID aphasic errors in naming and sentence generation tasks, stop and attempt to self correct 5/10x iwth occasional min A    Status  Achieved      SLP SHORT TERM GOAL #5   Title  Pt will write a word to name an object or picture, with occasional min A for error awareness    Status  Partially Met       SLP Long Term Goals - 05/14/18 1451      SLP LONG TERM GOAL #1   Title  Pt will comprehend 8 minute simlple conversation with occasional min gestural cues and repetition over 2 sessions    Baseline  05/05/18; 05/07/18; 05/12/18    Time  5    Period  Weeks    Status  Achieved      SLP LONG TERM GOAL #2   Title  Pt will ID aphasic errors/press of speech in simple conversation with occasional min A over 2 sessions    Baseline  05/07/18;     Time  4    Period  Weeks    Status  On-going      SLP LONG TERM GOAL #3   Title  Pt will comprehend 2-3 simple paragraph passage with minimal extended time and 80% accuracy    Baseline  05/12/18    Time  5    Period  Weeks    Status  Achieved      SLP LONG TERM GOAL #4   Title  Pt will generate functional written tasks (lists, checks, schedules, short email/letter ) with error ID 3/5 errors and occasional min A.    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 Reading comprhension of magazine artilcle 85% with rare min A. Fluent aphasia continues to affect pt's ability to communicate effectively with family, friends and in the community.  Cotnintue skilled ST to maximize communication for improved independence, safety and QOL    Speech Therapy Frequency  2x /  week    Treatment/Interventions  SLP instruction and feedback;Environmental controls;Compensatory techniques;Cognitive reorganization;Functional tasks;Compensatory strategies;Patient/family education;Multimodal communcation approach;Internal/external aids;Language facilitation    Potential to Achieve Goals  Good    Potential Considerations  Severity of impairments       Patient will benefit from skilled therapeutic intervention in order to improve the following deficits and impairments:   Aphasia    Problem List Patient Active Problem List   Diagnosis Date Noted  . Non compliance with medical treatment 03/23/2018  . History of tobacco use disorder 01/01/2018  . Syncope and collapse 01/01/2018  . 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 Acute right hemiparesis ([place]) 12/12/2017  . History of completed stroke 12/12/2017  . Leukocytosis 12/12/2017  . Hypokalemia 12/12/2017  . Hypotension   . 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 Poor compliance with CPAP treatment 03/04/2017  . OSA and COPD overlap syndrome ([place]) 03/04/2017  . Simple chronic bronchitis ([place]) 03/04/2017  . MCI (mild cognitive impairment) 11/27/2016  . Left sided lacunar infarction ([place]) 03/11/2014  . Cerebrovascular small vessel disease 03/11/2014  . Insomnia, idiopathic 12/20/2013  . Snoring 12/06/2013  . Essential hypertension, benign 10/26/2012  . Hyperlipidemia LDL goal <70 10/26/2012  . Polypharmacy 10/26/2012  . DM (diabetes mellitus) ([place]) 10/13/2011  . Tremor 10/13/2011  . GERD (gastroesophageal reflux disease) 10/13/2011  . Depression 10/13/2011    Rishaan Gunner, Annye Rusk MS, CCC-SLP 05/14/2018, 2:53 PM  [facility] [address] [place], [place], [address] Phone: [phone]   Fax:  [phone]   Name: KIM OKI MRN: [id number] Date of Birth: 08-26-1961

## 2018-05-14 NOTE — Telephone Encounter (Signed)
Rn spoke with patient about form needing a functional capacity filled out. The pt who has moderate aphasia put her husband on the phone. RN explain pts employer long term disability fax a form that requires her to do certain functional duties if she can. Rn stated the referral was sent to [facility] health outpatient on rehab at [phone] on [address]. Rn stated its only done on wed and Friday and is a 4 hours test at the most. Rn state the referral was receive and they will give pt a call. The husband was given the phone number to contact if he does not hear from them. Rn stated it will be a one time visit. Rn stated the MD will review the note from the therapist and see what they recommend,and complete the form.The husband verbalized understanding.

## 2018-05-19 ENCOUNTER — Encounter: Payer: Self-pay | Admitting: Speech Pathology

## 2018-05-19 ENCOUNTER — Ambulatory Visit: Payer: Self-pay | Admitting: Speech Pathology

## 2018-05-19 DIAGNOSIS — R4701 Aphasia: Secondary | ICD-10-CM

## 2018-05-19 NOTE — Therapy (Signed)
[facility] [address] [place], [place], [address] Phone: [phone]   Fax:  [phone]  Speech Language Pathology Treatment  Patient Details  Name: Brittany [name] MRN: [id number] Date of Birth: 1961/04/15 Referring Provider: Dr. Delman Cheadle   Encounter Date: 05/19/2018  End of Session - 05/19/18 1455    Visit Number  19    Number of Visits  25    Date for SLP Re-Evaluation  06/02/18    Authorization - Visit Number  49    Authorization - Number of Visits  31    SLP Start Time  5697    SLP Stop Time   9480    SLP Time Calculation (min)  44 min    Activity Tolerance  Patient tolerated treatment well       Past Medical History:  Diagnosis Date  . Chronic lower back pain   . Depression   . Fibromyalgia   . High cholesterol   . Seizures ([place]) 11/2017   "day after she came home from hospital after having stroke" (01/01/2018)  . Stroke [facility]) 12/06/2017   "speech issues since" (01/01/2018)  . Type II diabetes mellitus ([place])     Past Surgical History:  Procedure Laterality Date  . BACK SURGERY    . CHOLECYSTECTOMY    . INSERT / REPLACE / REMOVE PACEMAKER  01/01/2018  . IR CT HEAD LTD  12/06/2017  . IR PERCUTANEOUS ART THROMBECTOMY/INFUSION INTRACRANIAL INC DIAG ANGIO  12/06/2017  . LOOP RECORDER INSERTION N/A 12/12/2017   Procedure: LOOP RECORDER INSERTION;  Surgeon: Constance Haw, MD;  Location: [place] [facility];  Service: Cardiovascular;  Laterality: N/A;  . LOOP RECORDER REMOVAL  01/01/2018  . LOOP RECORDER REMOVAL N/A 01/01/2018   Procedure: LOOP RECORDER REMOVAL;  Surgeon: Constance Haw, MD;  Location: [place] [facility];  Service: Cardiovascular;  Laterality: N/A;  . PACEMAKER IMPLANT N/A 01/01/2018   Procedure: PACEMAKER IMPLANT;  Surgeon: Constance Haw, MD;  Location: [place] [facility];  Service: Cardiovascular;  Laterality: N/A;  . RADIOLOGY WITH ANESTHESIA N/A 12/06/2017   Procedure:  CODE STROKE;  Surgeon: Luanne Bras, MD;  Location: [facility];  Service: Radiology;  Laterality: N/A;  . TEE WITHOUT CARDIOVERSION N/A 12/10/2017   Procedure: TRANSESOPHAGEAL ECHOCARDIOGRAM (TEE);  Surgeon: Jerline Pain, MD;  Location: [facility] ENDOSCOPY;  Service: Cardiovascular;  Laterality: N/A;  . THROMBECTOMY FEMORAL ARTERY Right 12/07/2017   Procedure: THROMBECTOMY RIGHT FEMORAL ARTERY;  Surgeon: Waynetta Sandy, MD;  Location: [facility];  Service: Vascular;  Laterality: Right;  . TONSILLECTOMY      There were no vitals filed for this visit.  Subjective Assessment - 05/19/18 1408    Subjective  "I couldn't get it didn't do it - I didn't understand it"    Currently in Pain?  No/denies            ADULT SLP TREATMENT - 05/19/18 1409      General Information   Behavior/Cognition  Alert;Cooperative;Pleasant mood      Treatment Provided   Treatment provided  Cognitive-Linquistic      Cognitive-Linquistic Treatment   Treatment focused on  Aphasia;Patient/family/caregiver education    Skilled Treatment  Simple conversation re: current events at her workplace with rare questioning cues for clarification. Pt and son report improved success texting. Pt ID'd and attempted to correct 85% of aphasic errors with supervision cues. Written expression and divergent naming targeted together with rare min A to ID  errors, extended time and mod A for word finding.      Assessment / Recommendations / Plan   Plan  Continue with current plan of care      Progression Toward Goals   Progression toward goals  Progressing toward goals         SLP Short Term Goals - 05/19/18 1454      SLP SHORT TERM GOAL #1   Title  Pt will comprehend 3-4 phrase  description to ID or name object described 8/10x with occasional repetition and occasional min A    Status  Achieved      SLP SHORT TERM GOAL #2   Title  Pt will give 2-3 word phrase for a given word or picture with occasional min-mod A over 2  sessions.     Status  Achieved      SLP SHORT TERM GOAL #3   Title  Pt will name 5 items in a simple category with occasional min-mod A over 2 sessions    Status  Partially Met      SLP SHORT TERM GOAL #4   Title  Pt will ID aphasic errors in naming and sentence generation tasks, stop and attempt to self correct 5/10x iwth occasional min A    Status  Achieved      SLP SHORT TERM GOAL #5   Title  Pt will write a word to name an object or picture, with occasional min A for error awareness    Status  Partially Met       SLP Long Term Goals - 05/19/18 1454      SLP LONG TERM GOAL #1   Title  Pt will comprehend 8 minute simlple conversation with occasional min gestural cues and repetition over 2 sessions    Baseline  05/05/18; 05/07/18; 05/12/18    Time  5    Period  Weeks    Status  Achieved      SLP LONG TERM GOAL #2   Title  Pt will ID aphasic errors/press of speech in simple conversation with occasional min A over 2 sessions    Baseline  05/07/18; 05/19/18    Time  4    Period  Weeks    Status  Achieved      SLP LONG TERM GOAL #3   Title  Pt will comprehend 2-3 simple paragraph passage with minimal extended time and 80% accuracy    Baseline  05/12/18    Time  5    Period  Weeks    Status  Achieved      SLP LONG TERM GOAL #4   Title  Pt will generate functional written tasks (lists, checks, schedules, short email/letter ) with error ID 3/5 errors and occasional min A.    Time  3    Period  Weeks    Status  On-going       Plan - 05/19/18 1449    Clinical Impression Statement  Fluent aphasia persists with improved error awareness and use of compensations aphasia. Written errors persist with neologistic errors and mod A to correct.  Pt with improved texting per family. Continue skilled ST to maximize verbal expression, intelligibility and written expression to improve independence in communication    Speech Therapy Frequency  2x / week    Potential to Achieve Goals  Good     Potential Considerations  Severity of impairments    Consulted and Agree with Plan of Care  Patient    Family  Member Consulted  son Joey       Patient will benefit from skilled therapeutic intervention in order to improve the following deficits and impairments:   Aphasia    Problem List Patient Active Problem List   Diagnosis Date Noted  . Non compliance with medical treatment 03/23/2018  . History of tobacco use disorder 01/01/2018  . Syncope and collapse 01/01/2018  . 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 Acute right hemiparesis ([place]) 12/12/2017  . History of completed stroke 12/12/2017  . Leukocytosis 12/12/2017  . Hypokalemia 12/12/2017  . Hypotension   . 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 Poor compliance with CPAP treatment 03/04/2017  . OSA and COPD overlap syndrome ([place]) 03/04/2017  . Simple chronic bronchitis ([place]) 03/04/2017  . MCI (mild cognitive impairment) 11/27/2016  . Left sided lacunar infarction ([place]) 03/11/2014  . Cerebrovascular small vessel disease 03/11/2014  . Insomnia, idiopathic 12/20/2013  . Snoring 12/06/2013  . Essential hypertension, benign 10/26/2012  . Hyperlipidemia LDL goal <70 10/26/2012  . Polypharmacy 10/26/2012  . DM (diabetes mellitus) ([place]) 10/13/2011  . Tremor 10/13/2011  . GERD (gastroesophageal reflux disease) 10/13/2011  . Depression 10/13/2011    Lovvorn, Annye Rusk MS, CCC-SLP 05/19/2018, 2:55 PM  [facility] [address] [place], [place], [address] Phone: [phone]   Fax:  [phone]   Name: WENDELIN READER MRN: [id number] Date of Birth: 21-Dec-1960

## 2018-05-20 NOTE — Telephone Encounter (Signed)
The Patient  Is already scheduled for this Friday 05/22/2018 .

## 2018-05-20 NOTE — Telephone Encounter (Signed)
Rn spoke to referral about pts referral was sent last week. Rn explain patient needs a functional capability evaluation for her being out of work. The staff stated the therapist has not given them a time and date of when pt can be schedule. The therapist does it his days off. Rn ask what time frame will the pt be call. They could not give me a time frame if the pt will be call this week or next week. The referral staff only said they will call the patient.

## 2018-05-20 NOTE — Telephone Encounter (Signed)
Rn call husband he is aware of wife appt [facility]/2018 [facility] [address] for one tim functional capacity evaluation for her job.

## 2018-05-21 ENCOUNTER — Ambulatory Visit: Payer: Self-pay | Admitting: Speech Pathology

## 2018-05-21 ENCOUNTER — Encounter: Payer: Self-pay | Admitting: Speech Pathology

## 2018-05-21 DIAGNOSIS — R4701 Aphasia: Secondary | ICD-10-CM

## 2018-05-21 NOTE — Therapy (Signed)
[facility] [address] [place], [place], [address] Phone: [phone]   Fax:  [phone]  Speech Language Pathology Treatment  Patient Details  Name: Brittany [name] MRN: [id number] Date of Birth: 10-Oct-1960 Referring Provider: Dr. Delman Cheadle   Encounter Date: 05/21/2018  End of Session - 05/21/18 1506    Visit Number  20    Number of Visits  25    Authorization - Visit Number  20    Authorization - Number of Visits  27    SLP Start Time  1400    SLP Stop Time   1444    SLP Time Calculation (min)  44 min    Activity Tolerance  Patient tolerated treatment well       Past Medical History:  Diagnosis Date  . Chronic lower back pain   . Depression   . Fibromyalgia   . High cholesterol   . Seizures ([place]) 11/2017   "day after she came home from hospital after having stroke" (01/01/2018)  . Stroke [facility]) 12/06/2017   "speech issues since" (01/01/2018)  . Type II diabetes mellitus ([place])     Past Surgical History:  Procedure Laterality Date  . BACK SURGERY    . CHOLECYSTECTOMY    . INSERT / REPLACE / REMOVE PACEMAKER  01/01/2018  . IR CT HEAD LTD  12/06/2017  . IR PERCUTANEOUS ART THROMBECTOMY/INFUSION INTRACRANIAL INC DIAG ANGIO  12/06/2017  . LOOP RECORDER INSERTION N/A 12/12/2017   Procedure: LOOP RECORDER INSERTION;  Surgeon: Constance Haw, MD;  Location: [facility];  Service: Cardiovascular;  Laterality: N/A;  . LOOP RECORDER REMOVAL  01/01/2018  . LOOP RECORDER REMOVAL N/A 01/01/2018   Procedure: LOOP RECORDER REMOVAL;  Surgeon: Constance Haw, MD;  Location: [facility];  Service: Cardiovascular;  Laterality: N/A;  . PACEMAKER IMPLANT N/A 01/01/2018   Procedure: PACEMAKER IMPLANT;  Surgeon: Constance Haw, MD;  Location: [facility];  Service: Cardiovascular;  Laterality: N/A;  . RADIOLOGY WITH ANESTHESIA N/A 12/06/2017   Procedure: CODE STROKE;  Surgeon: Luanne Bras, MD;  Location: [facility];  Service: Radiology;  Laterality: N/A;  . TEE WITHOUT CARDIOVERSION N/A 12/10/2017   Procedure: TRANSESOPHAGEAL ECHOCARDIOGRAM (TEE);  Surgeon: Jerline Pain, MD;  Location: [facility] ENDOSCOPY;  Service: Cardiovascular;  Laterality: N/A;  . THROMBECTOMY FEMORAL ARTERY Right 12/07/2017   Procedure: THROMBECTOMY RIGHT FEMORAL ARTERY;  Surgeon: Waynetta Sandy, MD;  Location: [facility];  Service: Vascular;  Laterality: Right;  . TONSILLECTOMY      There were no vitals filed for this visit.  Subjective Assessment - 05/21/18 1501    Subjective  "I kept trying it" re: fill in sentence HW    Patient is accompained by:  Family member   [name]   Currently in Pain?  No/denies            ADULT SLP TREATMENT - 05/21/18 1502      General Information   Behavior/Cognition  Alert;Cooperative;Pleasant mood      Treatment Provided   Treatment provided  Cognitive-Linquistic      Cognitive-Linquistic Treatment   Treatment focused on  Aphasia;Patient/family/caregiver education    Skilled Treatment  Written expression sentence level and word finding targeted using VNeST (Verb netowork strengthening treatment) to generate S-V-O sentences with rare min A. Pt generated more complex sentences using promts when, where and why with chosen s-v-o senteces with occasional min A for error awareness and usual  mod A for sentence structure.       Assessment / Recommendations / Plan   Plan  Continue with current plan of care      Progression Toward Goals   Progression toward goals  Progressing toward goals         SLP Short Term Goals - 05/21/18 1506      SLP SHORT TERM GOAL #1   Title  Pt will comprehend 3-4 phrase  description to ID or name object described 8/10x with occasional repetition and occasional min A    Status  Achieved      SLP SHORT TERM GOAL #2   Title  Pt will give 2-3 word phrase for a given word or picture with occasional min-mod A over 2 sessions.      Status  Achieved      SLP SHORT TERM GOAL #3   Title  Pt will name 5 items in a simple category with occasional min-mod A over 2 sessions    Status  Partially Met      SLP SHORT TERM GOAL #4   Title  Pt will ID aphasic errors in naming and sentence generation tasks, stop and attempt to self correct 5/10x iwth occasional min A    Status  Achieved      SLP SHORT TERM GOAL #5   Title  Pt will write a word to name an object or picture, with occasional min A for error awareness    Status  Partially Met       SLP Long Term Goals - 05/21/18 1506      SLP LONG TERM GOAL #1   Title  Pt will comprehend 8 minute simlple conversation with occasional min gestural cues and repetition over 2 sessions    Baseline  05/05/18; 05/07/18; 05/12/18    Time  5    Period  Weeks    Status  Achieved      SLP LONG TERM GOAL #2   Title  Pt will ID aphasic errors/press of speech in simple conversation with occasional min A over 2 sessions    Baseline  05/07/18; 05/19/18    Time  4    Period  Weeks    Status  Achieved      SLP LONG TERM GOAL #3   Title  Pt will comprehend 2-3 simple paragraph passage with minimal extended time and 80% accuracy    Baseline  05/12/18    Time  5    Period  Weeks    Status  Achieved      SLP LONG TERM GOAL #4   Title  Pt will generate functional written tasks (lists, checks, schedules, short email/letter ) with error ID 3/5 errors and occasional min A.    Time  3    Period  Weeks    Status  On-going       Plan - 05/21/18 1505    Clinical Impression Statement  Fluent aphasia persists with improved error awareness and use of compensations aphasia. Written errors persist with neologistic errors and mod A to correct.  Pt with improved texting per family. Continue skilled ST to maximize verbal expression, intelligibility and written expression to improve independence in communication    Speech Therapy Frequency  2x / week    Treatment/Interventions  SLP instruction and  feedback;Environmental controls;Compensatory techniques;Cognitive reorganization;Functional tasks;Compensatory strategies;Patient/family education;Multimodal communcation approach;Internal/external aids;Language facilitation    Potential Considerations  Severity of impairments       Patient will benefit  from skilled therapeutic intervention in order to improve the following deficits and impairments:   Aphasia    Problem List Patient Active Problem List   Diagnosis Date Noted  . Non compliance with medical treatment 03/23/2018  . History of tobacco use disorder 01/01/2018  . Syncope and collapse 01/01/2018  . 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) 12/12/2017  . Acute right hemiparesis ([place]) 12/12/2017  . History of completed stroke 12/12/2017  . Leukocytosis 12/12/2017  . Hypokalemia 12/12/2017  . Hypotension   . 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 Poor compliance with CPAP treatment 03/04/2017  . OSA and COPD overlap syndrome ([place]) 03/04/2017  . Simple chronic bronchitis ([place]) 03/04/2017  . MCI (mild cognitive impairment) 11/27/2016  . Left sided lacunar infarction ([place]) 03/11/2014  . Cerebrovascular small vessel disease 03/11/2014  . Insomnia, idiopathic 12/20/2013  . Snoring 12/06/2013  . Essential hypertension, benign 10/26/2012  . Hyperlipidemia LDL goal <70 10/26/2012  . Polypharmacy 10/26/2012  . DM (diabetes mellitus) ([place]) 10/13/2011  . Tremor 10/13/2011  . GERD (gastroesophageal reflux disease) 10/13/2011  . Depression 10/13/2011    Via [place], Annye Rusk MS, CCC-SLP 05/21/2018, 3:07 PM  [facility] [address] [place], [place], [address] Phone: [phone]   Fax:  [phone]   Name: Brittany [name] MRN: [id number] Date of Birth: September 03, 1961

## 2018-05-22 ENCOUNTER — Ambulatory Visit: Payer: Self-pay

## 2018-05-22 DIAGNOSIS — R4701 Aphasia: Secondary | ICD-10-CM

## 2018-05-22 NOTE — Therapy (Signed)
[facility] [address] [place], [place], [address] Phone: [phone]   Fax:  [phone]  Physical Therapy Evaluation/FCE  Patient Details  Name: Brittany [name] MRN: [id number] Date of Birth: [birth year] Referring Provider: Delia Heady, MD   Encounter Date: 05/22/2018  PT End of Session - 05/22/18 1151    Visit Number  1    Number of Visits  1    PT Start Time  0755    PT Stop Time  1120    PT Time Calculation (min)  205 min    Activity Tolerance  Patient limited by fatigue;Patient limited by pain    Behavior During Therapy  [facility] for tasks assessed/performed       Past Medical History:  Diagnosis Date  . Chronic lower back pain   . Depression   . Fibromyalgia   . High cholesterol   . Seizures ([facility]) 11/2017   "day after she came home from hospital after having stroke" (01/01/2018)  . Stroke [facility]) 12/06/2017   "speech issues since" (01/01/2018)  . Type II diabetes mellitus ([facility])     Past Surgical History:  Procedure Laterality Date  . BACK SURGERY    . CHOLECYSTECTOMY    . INSERT / REPLACE / REMOVE PACEMAKER  01/01/2018  . IR CT HEAD LTD  12/06/2017  . IR PERCUTANEOUS ART THROMBECTOMY/INFUSION INTRACRANIAL INC DIAG ANGIO  12/06/2017  . LOOP RECORDER INSERTION N/A 12/12/2017   Procedure: LOOP RECORDER INSERTION;  Surgeon: Regan Lemming, MD;  [facility];  Service: Cardiovascular;  Laterality: N/A;  . LOOP RECORDER REMOVAL  01/01/2018  . LOOP RECORDER REMOVAL N/A 01/01/2018   Procedure: LOOP RECORDER REMOVAL;  Surgeon: Regan Lemming, MD;  Location: [facility];  Service: Cardiovascular;  Laterality: N/A;  . PACEMAKER IMPLANT N/A 01/01/2018   Procedure: PACEMAKER IMPLANT;  Surgeon: Regan Lemming, MD;  [facility]: [facility];  Service: Cardiovascular;  Laterality: N/A;  . RADIOLOGY WITH ANESTHESIA N/A 12/06/2017   Procedure: CODE STROKE;  Surgeon: Julieanne Cotton, MD;   Location: [facility] OR;  Service: Radiology;  Laterality: N/A;  . TEE WITHOUT CARDIOVERSION N/A 12/10/2017   Procedure: TRANSESOPHAGEAL ECHOCARDIOGRAM (TEE);  Surgeon: Jake Bathe, MD;  Location: [facility] ENDOSCOPY;  Service: Cardiovascular;  Laterality: N/A;  . THROMBECTOMY FEMORAL ARTERY Right 12/07/2017   Procedure: THROMBECTOMY RIGHT FEMORAL ARTERY;  Surgeon: Maeola Harman, MD;  Location: [facility];  Service: Vascular;  Laterality: Right;  . TONSILLECTOMY      There were no vitals filed for this visit.   Subjective Assessment - 05/22/18 1148    Subjective  She reports post CVA with need for FCE for disability    Patient is accompained by:  Family member   spouse   Currently in Pain?  No/denies         [facility] PT Assessment - 05/22/18 0001      Assessment   Medical Diagnosis  S/P CVA    Referring Provider  Delia Heady, MD    Onset Date/Surgical Date  12/06/17      Precautions   Precautions  None      Restrictions   Weight Bearing Restrictions  No                Objective measurements completed on examination: See above findings.              PT Education - 05/22/18 1150    Education Details  rest  as needed for fatigue and muscle soreness    Person(s) Educated  Patient    Methods  Explanation                  Plan - 05/22/18 1152    Clinical Impression Statement  Brittany [name] completed the FCE process with reating of Sedentary for 8 hour work day but as she had many self limiting tasks  this is to be minimum level and not max. Other factors such as intension tremors and  aphasia would make returning to her previous job very difficult. She wa also fatigued at end and would benefit from more aerobic exercise.    PT Next Visit Plan  No follow up report will be sent to MD    Consulted and Agree with Plan of Care  Patient      [facility] Patient will benefit from skilled therapeutic intervention in order to improve the  following deficits and impairments:     Visit Diagnosis: Aphasia     Problem List Patient Active Problem List   Diagnosis Date Noted  . Non compliance with medical treatment 03/23/2018  . History of tobacco use disorder 01/01/2018  . Syncope and collapse 01/01/2018  . 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 Acute right hemiparesis ([facility]) 12/12/2017  . History of completed stroke 12/12/2017  . Leukocytosis 12/12/2017  . Hypokalemia 12/12/2017  . Hypotension   . 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 Poor compliance with CPAP treatment 03/04/2017  . OSA and COPD overlap syndrome (HCC) 03/04/2017  . Simple chronic bronchitis ([facility]) 03/04/2017  . MCI (mild cognitive impairment) 11/27/2016  . Left sided lacunar infarction (HCC) 03/11/2014  . Cerebrovascular small vessel disease 03/11/2014  . Insomnia, idiopathic 12/20/2013  . Snoring 12/06/2013  . Essential hypertension, benign 10/26/2012  . Hyperlipidemia LDL goal <70 10/26/2012  . Polypharmacy 10/26/2012  . DM (diabetes mellitus) (HCC) 10/13/2011  . Tremor 10/13/2011  . GERD (gastroesophageal reflux disease) 10/13/2011  . Depression 10/13/2011    Caprice RedChasse, Amiracle Neises M  PT 05/22/2018, 11:55 AM  [facility] [address] [place], [place], [address] Phone: [phone]   Fax:  [phone]  Name: Brittany [name] MRN: [id number] Date of Birth: Oct 04, 1960

## 2018-05-26 ENCOUNTER — Ambulatory Visit: Payer: Self-pay | Admitting: Speech Pathology

## 2018-05-26 ENCOUNTER — Encounter: Payer: Self-pay | Admitting: Speech Pathology

## 2018-05-26 DIAGNOSIS — R4701 Aphasia: Secondary | ICD-10-CM

## 2018-05-26 NOTE — Therapy (Signed)
[facility] [address] [place], [place], [address] Phone: [phone]   Fax:  [phone]  Speech Language Pathology Treatment  Patient Details  Name: Brittany [name] MRN: [id number] Date of Birth: 1961/01/08 Referring Provider: Dr. Delman Cheadle   Encounter Date: 05/26/2018  End of Session - 05/26/18 1446    Visit Number  21    Number of Visits  25    Date for SLP Re-Evaluation  06/02/18    Authorization - Visit Number  21    Authorization - Number of Visits  7    SLP Start Time  8563    SLP Stop Time   1446    SLP Time Calculation (min)  44 min    Activity Tolerance  Patient tolerated treatment well       Past Medical History:  Diagnosis Date  . Chronic lower back pain   . Depression   . Fibromyalgia   . High cholesterol   . Seizures ([place]) 11/2017   "day after she came home from hospital after having stroke" (01/01/2018)  . Stroke [facility]) 12/06/2017   "speech issues since" (01/01/2018)  . Type II diabetes mellitus ([place])     Past Surgical History:  Procedure Laterality Date  . BACK SURGERY    . CHOLECYSTECTOMY    . INSERT / REPLACE / REMOVE PACEMAKER  01/01/2018  . IR CT HEAD LTD  12/06/2017  . IR PERCUTANEOUS ART THROMBECTOMY/INFUSION INTRACRANIAL INC DIAG ANGIO  12/06/2017  . LOOP RECORDER INSERTION N/A 12/12/2017   Procedure: LOOP RECORDER INSERTION;  Surgeon: Constance Haw, MD;  Location: [facility];  Service: Cardiovascular;  Laterality: N/A;  . LOOP RECORDER REMOVAL  01/01/2018  . LOOP RECORDER REMOVAL N/A 01/01/2018   Procedure: LOOP RECORDER REMOVAL;  Surgeon: Constance Haw, MD;  Location: [facility];  Service: Cardiovascular;  Laterality: N/A;  . PACEMAKER IMPLANT N/A 01/01/2018   Procedure: PACEMAKER IMPLANT;  Surgeon: Constance Haw, MD;  Location: [facility];  Service: Cardiovascular;  Laterality: N/A;  . RADIOLOGY WITH ANESTHESIA N/A 12/06/2017   Procedure:  CODE STROKE;  Surgeon: Luanne Bras, MD;  Location: [facility];  Service: Radiology;  Laterality: N/A;  . TEE WITHOUT CARDIOVERSION N/A 12/10/2017   Procedure: TRANSESOPHAGEAL ECHOCARDIOGRAM (TEE);  Surgeon: Jerline Pain, MD;  Location: [facility] END[facility];  Service: Cardiovascular;  Laterality: N/A;  . THROMBECTOMY FEMORAL ARTERY Right 12/07/2017   Procedure: THROMBECTOMY RIGHT FEMORAL ARTERY;  Surgeon: Waynetta Sandy, MD;  Location: [facility];  Service: Vascular;  Laterality: Right;  . TONSILLECTOMY      There were no vitals filed for this visit.  Subjective Assessment - 05/26/18 1416    Subjective  "I get frustrated when I type"    Patient is accompained by:  Family member   Joey   Currently in Pain?  No/denies            ADULT SLP TREATMENT - 05/26/18 1418      General Information   Behavior/Cognition  Alert;Cooperative;Pleasant mood      Treatment Provided   Treatment provided  Cognitive-Linquistic      Cognitive-Linquistic Treatment   Treatment focused on  Aphasia;Patient/family/caregiver education    Skilled Treatment  Functional written expression targeted in short letters, greetings/ending for greeting cards, birthday wishes- pt ID'd errors 4/5x  and corrected      Assessment / Recommendations / [place] with current plan of care  Progression Toward Goals   Progression toward goals  Progressing toward goals         SLP Short Term Goals - 05/21/18 1506      SLP SHORT TERM GOAL #1   Title  Pt will comprehend 3-4 phrase  description to ID or name object described 8/10x with occasional repetition and occasional min A    Status  Achieved      SLP SHORT TERM GOAL #2   Title  Pt will give 2-3 word phrase for a given word or picture with occasional min-mod A over 2 sessions.     Status  Achieved      SLP SHORT TERM GOAL #3   Title  Pt will name 5 items in a simple category with occasional min-mod A over 2 sessions    Status  Partially Met       SLP SHORT TERM GOAL #4   Title  Pt will ID aphasic errors in naming and sentence generation tasks, stop and attempt to self correct 5/10x iwth occasional min A    Status  Achieved      SLP SHORT TERM GOAL #5   Title  Pt will write a word to name an object or picture, with occasional min A for error awareness    Status  Partially Met       SLP Long Term Goals - 05/21/18 1506      SLP LONG TERM GOAL #1   Title  Pt will comprehend 8 minute simlple conversation with occasional min gestural cues and repetition over 2 sessions    Baseline  05/05/18; 05/07/18; 05/12/18    Time  5    Period  Weeks    Status  Achieved      SLP LONG TERM GOAL #2   Title  Pt will ID aphasic errors/press of speech in simple conversation with occasional min A over 2 sessions    Baseline  05/07/18; 05/19/18    Time  4    Period  Weeks    Status  Achieved      SLP LONG TERM GOAL #3   Title  Pt will comprehend 2-3 simple paragraph passage with minimal extended time and 80% accuracy    Baseline  05/12/18    Time  5    Period  Weeks    Status  Achieved      SLP LONG TERM GOAL #4   Title  Pt will generate functional written tasks (lists, checks, schedules, short email/letter ) with error ID 3/5 errors and occasional min A.    Time  3    Period  Weeks    Status  On-going       Plan - 05/26/18 1439    Clinical Impression Statement  Fluent aphasia persists with improved error awareness and use of compensations aphasia. Written errors persist with neologistic errors and mod A to correct.  Pt with improved texting per family. Continue skilled ST 1 more visits  to maximize verbal expression, intelligibility and written expression to improve independence in communication    Treatment/Interventions  SLP instruction and feedback;Environmental controls;Compensatory techniques;Cognitive reorganization;Functional tasks;Compensatory strategies;Patient/family education;Multimodal communcation approach;Internal/external  aids;Language facilitation       Patient will benefit from skilled therapeutic intervention in order to improve the following deficits and impairments:   Aphasia    Problem List Patient Active Problem List   Diagnosis Date Noted  . Non compliance with medical treatment 03/23/2018  . History of tobacco use disorder 01/01/2018  .  Syncope and collapse 01/01/2018  . 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 Acute right hemiparesis ([place]) 12/12/2017  . History of completed stroke 12/12/2017  . Leukocytosis 12/12/2017  . Hypokalemia 12/12/2017  . Hypotension   . 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 Poor compliance with CPAP treatment 03/04/2017  . OSA and COPD overlap syndrome ([place]) 03/04/2017  . Simple chronic bronchitis ([place]) 03/04/2017  . MCI (mild cognitive impairment) 11/27/2016  . Left sided lacunar infarction ([place]) 03/11/2014  . Cerebrovascular small vessel disease 03/11/2014  . Insomnia, idiopathic 12/20/2013  . Snoring 12/06/2013  . Essential hypertension, benign 10/26/2012  . Hyperlipidemia LDL goal <70 10/26/2012  . Polypharmacy 10/26/2012  . DM (diabetes mellitus) ([place]) 10/13/2011  . Tremor 10/13/2011  . GERD (gastroesophageal reflux disease) 10/13/2011  . Depression 10/13/2011    Ramy Greth, Annye Rusk MS, CCC-SLP 05/26/2018, 2:49 PM  [facility] [address] [place], [place], [address] Phone: [phone]   Fax:  [phone]   Name: Brittany [name] MRN: [id number] Date of Birth: 24-Oct-1960

## 2018-05-27 ENCOUNTER — Telehealth: Payer: Self-pay | Admitting: *Deleted

## 2018-05-27 NOTE — Telephone Encounter (Signed)
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

## 2018-05-27 NOTE — Telephone Encounter (Signed)
Fee waive for form this time. Pt had another form filled out in JUly 2019 and paid for that one. Pt seen by therapist who evaluates for physical capabilities evaluation. Fax and epic notes have been reviewed. Form fax to [phone] twice,and confirmed.

## 2018-05-28 ENCOUNTER — Ambulatory Visit: Payer: Self-pay | Admitting: Speech Pathology

## 2018-05-28 ENCOUNTER — Encounter: Payer: Self-pay | Admitting: Speech Pathology

## 2018-05-28 DIAGNOSIS — R4701 Aphasia: Secondary | ICD-10-CM

## 2018-05-28 NOTE — Therapy (Signed)
[facility] [address] [place], [place], [address] Phone: [phone]   Fax:  [phone]  Speech Language Pathology Treatment  Patient Details  Name: Brittany [name] MRN: [id number] Date of Birth: Apr 05, 1961 Referring Provider: Dr. Delman Cheadle   Encounter Date: 05/28/2018  End of Session - 05/28/18 2140    Visit Number  22    Number of Visits  25    Date for SLP Re-Evaluation  06/02/18    Authorization Type  [place] has 30  visit limit, has had PT - will adjust visits accordingly. [name] says HHST used 3, leaving 27, however I believe she received more HHST per pt    Authorization Time Period  I initally scheduled for 2x a week for 8 weeks, will add on visists per POC as indicated    Authorization - Visit Number  94    Authorization - Number of Visits  27    SLP Start Time  1401    SLP Stop Time   1440    SLP Time Calculation (min)  39 min    Activity Tolerance  Patient tolerated treatment well       Past Medical History:  Diagnosis Date  . Chronic lower back pain   . Depression   . Fibromyalgia   . High cholesterol   . Seizures ([place]) 11/2017   "day after she came home from hospital after having stroke" (01/01/2018)  . Stroke [facility]) 12/06/2017   "speech issues since" (01/01/2018)  . Type II diabetes mellitus ([place])     Past Surgical History:  Procedure Laterality Date  . BACK SURGERY    . CHOLECYSTECTOMY    . INSERT / REPLACE / REMOVE PACEMAKER  01/01/2018  . IR CT HEAD LTD  12/06/2017  . IR PERCUTANEOUS ART THROMBECTOMY/INFUSION INTRACRANIAL INC DIAG ANGIO  12/06/2017  . LOOP RECORDER INSERTION N/A 12/12/2017   Procedure: LOOP RECORDER INSERTION;  Surgeon: Constance Haw, MD;  Location: [facility];  Service: Cardiovascular;  Laterality: N/A;  . LOOP RECORDER REMOVAL  01/01/2018  . LOOP RECORDER REMOVAL N/A 01/01/2018   Procedure: LOOP RECORDER REMOVAL;  Surgeon: Constance Haw, MD;  Location:  [facility];  Service: Cardiovascular;  Laterality: N/A;  . PACEMAKER IMPLANT N/A 01/01/2018   Procedure: PACEMAKER IMPLANT;  Surgeon: Constance Haw, MD;  Location: [facility];  Service: Cardiovascular;  Laterality: N/A;  . RADIOLOGY WITH ANESTHESIA N/A 12/06/2017   Procedure: CODE STROKE;  Surgeon: Luanne Bras, MD;  Location: [facility];  Service: Radiology;  Laterality: N/A;  . TEE WITHOUT CARDIOVERSION N/A 12/10/2017   Procedure: TRANSESOPHAGEAL ECHOCARDIOGRAM (TEE);  Surgeon: Jerline Pain, MD;  Location: [facility] ENDOSCOPY;  Service: Cardiovascular;  Laterality: N/A;  . THROMBECTOMY FEMORAL ARTERY Right 12/07/2017   Procedure: THROMBECTOMY RIGHT FEMORAL ARTERY;  Surgeon: Waynetta Sandy, MD;  Location: [facility];  Service: Vascular;  Laterality: Right;  . TONSILLECTOMY      There were no vitals filed for this visit.  Subjective Assessment - 05/28/18 2134    Subjective  "I got to go now to the diabetes thing"     Currently in Pain?  No/denies            ADULT SLP TREATMENT - 05/28/18 2134      General Information   Behavior/Cognition  Alert;Cooperative;Pleasant mood      Treatment Provided   Treatment provided  Cognitive-Linquistic      Cognitive-Linquistic Treatment  Treatment focused on  Aphasia;Patient/family/caregiver education    Skilled Treatment  Targeted functional written expression with occasional min A for error awareness, sentence order and word finding. Trained pt on Talk Path for ongoing language activities to continue upon d/c from [place]. Conversation with paraphasias, pt self corrects with rare min A.      Assessment / Recommendations / Plan   Plan  Discharge SLP treatment due to (comment)      Progression Toward Goals   Progression toward goals  Goals met, education completed, patient discharged from SLP       SLP Education - 05/28/18 2137    Education Details  Continue Talk Path app upon d/c    Person(s) Educated  Patient     Methods  Explanation;Demonstration;Verbal cues    Comprehension  Verbalized understanding;Returned demonstration       SPEECH THERAPY DISCHARGE SUMMARY  Visits from Start of Care: 22  Current functional level related to goals / functional outcomes: See goals below   Remaining deficits: Mildl to moderate fluent aphasia   Education / Equipment: Compensations for verbal and written expression, compensations for reading comprehension, CVA ed Plan: Patient agrees to discharge.  Patient goals were met. Patient is being discharged due to meeting the stated rehab goals.  ?????         SLP Short Term Goals - 05/28/18 2139      SLP SHORT TERM GOAL #1   Title  Pt will comprehend 3-4 phrase  description to ID or name object described 8/10x with occasional repetition and occasional min A    Status  Achieved      SLP SHORT TERM GOAL #2   Title  Pt will give 2-3 word phrase for a given word or picture with occasional min-mod A over 2 sessions.     Status  Achieved      SLP SHORT TERM GOAL #3   Title  Pt will name 5 items in a simple category with occasional min-mod A over 2 sessions    Status  Partially Met      SLP SHORT TERM GOAL #4   Title  Pt will ID aphasic errors in naming and sentence generation tasks, stop and attempt to self correct 5/10x iwth occasional min A    Status  Achieved      SLP SHORT TERM GOAL #5   Title  Pt will write a word to name an object or picture, with occasional min A for error awareness    Status  Partially Met       SLP Long Term Goals - 05/28/18 2139      SLP LONG TERM GOAL #1   Title  Pt will comprehend 8 minute simlple conversation with occasional min gestural cues and repetition over 2 sessions    Baseline  05/05/18; 05/07/18; 05/12/18    Time  5    Period  Weeks    Status  Achieved      SLP LONG TERM GOAL #2   Title  Pt will ID aphasic errors/press of speech in simple conversation with occasional min A over 2 sessions    Baseline  05/07/18;  05/19/18    Time  4    Period  Weeks    Status  Achieved      SLP LONG TERM GOAL #3   Title  Pt will comprehend 2-3 simple paragraph passage with minimal extended time and 80% accuracy    Baseline  05/12/18    Time  5    Period  Weeks    Status  Achieved      SLP LONG TERM GOAL #4   Title  Pt will generate functional written tasks (lists, checks, schedules, short email/letter ) with error ID 3/5 errors and occasional min A.    Time  3    Period  Weeks    Status  Achieved       Plan - 05/28/18 2137    Clinical Impression Statement  Mild to moderate fluent aphasia, with short, basic conversation functional. Simple functional written expression with rare min A for error awareness. Goals met, education complete - d/c ST - pt in agreement    Speech Therapy Frequency  2x / week    Treatment/Interventions  SLP instruction and feedback;Environmental controls;Compensatory techniques;Cognitive reorganization;Functional tasks;Compensatory strategies;Patient/family education;Multimodal communcation approach;Internal/external aids;Language facilitation    Potential to Achieve Goals  Good    Potential Considerations  Severity of impairments       Patient will benefit from skilled therapeutic intervention in order to improve the following deficits and impairments:   Aphasia    Problem List Patient Active Problem List   Diagnosis Date Noted  . Non compliance with medical treatment 03/23/2018  . History of tobacco use disorder 01/01/2018  . Syncope and collapse 01/01/2018  . 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 Acute right hemiparesis ([place]) 12/12/2017  . History of completed stroke 12/12/2017  . Leukocytosis 12/12/2017  . Hypokalemia 12/12/2017  . Hypotension   . 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 Poor compliance with CPAP  treatment 03/04/2017  . OSA and COPD overlap syndrome ([place]) 03/04/2017  . Simple chronic bronchitis ([place]) 03/04/2017  . MCI (mild cognitive impairment) 11/27/2016  . Left sided lacunar infarction ([place]) 03/11/2014  . Cerebrovascular small vessel disease 03/11/2014  . Insomnia, idiopathic 12/20/2013  . Snoring 12/06/2013  . Essential hypertension, benign 10/26/2012  . Hyperlipidemia LDL goal <70 10/26/2012  . Polypharmacy 10/26/2012  . DM (diabetes mellitus) ([place]) 10/13/2011  . Tremor 10/13/2011  . GERD (gastroesophageal reflux disease) 10/13/2011  . Depression 10/13/2011    Delmar Dondero, Annye Rusk MS, CCC-SLP 05/28/2018, 9:41 PM  [facility] [address] [place], [place], [address] Phone: [phone]   Fax:  [phone]   Name: Brittany [name] MRN: [id number] Date of Birth: 09-29-1960

## 2018-06-02 ENCOUNTER — Encounter: Payer: Self-pay | Attending: Endocrinology | Admitting: Nutrition

## 2018-06-02 DIAGNOSIS — E118 Type 2 diabetes mellitus with unspecified complications: Secondary | ICD-10-CM

## 2018-06-02 DIAGNOSIS — E1149 Type 2 diabetes mellitus with other diabetic neurological complication: Secondary | ICD-10-CM | POA: Insufficient documentation

## 2018-06-02 DIAGNOSIS — Z713 Dietary counseling and surveillance: Secondary | ICD-10-CM | POA: Insufficient documentation

## 2018-06-02 DIAGNOSIS — Z794 Long term (current) use of insulin: Secondary | ICD-10-CM | POA: Insufficient documentation

## 2018-06-02 DIAGNOSIS — IMO0002 Reserved for concepts with insufficient information to code with codable children: Secondary | ICD-10-CM

## 2018-06-02 DIAGNOSIS — E1165 Type 2 diabetes mellitus with hyperglycemia: Secondary | ICD-10-CM

## 2018-06-03 NOTE — Progress Notes (Signed)
Patient is here with her son.  She has had a stroke and can not speak clearly.   She reports that he her husband gives her the insulin, and he is not here, so she does not know what she is taking. Typical day: 8-9Up.  Will take her insulin and eat breakfast: This morning she had 3 waffles with regular syrup. 10AM ususally a snack of regular jello 12:30:  Will have a sandwich with some chips, and drinks water most of the time, but some days she sips on regular gingerale mixed with water--all day. 3-4PM: regular jello 6PM supper:  Meat, starchy veg., and non starchy veg. With ususally water to drink 8PM snack of jello or something sweet.  Pt. Did not bring meter.  Says is testing once a day.  FBSs "ususally in the low 200s.    Patient is very disturbed about her weight gain.  Discussed the need to take more insulin when she is drinking sweet drinks and eating Jello and using regular syrup-which will add to her weight.  Discussed the need to stop drinking sweet drinks, and regular syrup and regular jello.  She reported good understanding of this.  Son suggested she start his diet of nutrisystem, and asked me if this would be all right.  I said yes, but that it is not the food that is running her blood sugars up, but the sweet drinks, and jello.  And that if she stopped this, she would possibly need to reduce her Novolog.  But that we do not know what is going on, because she is not testing.  We reviewed low blood sugar protocol, and suggested that if she starts this diet, and drops low after the meal, she can reduce the Novolog by 1-2u from the premeal dose.   Son Reported good understanding of this.  Written directions given for this.

## 2018-06-03 NOTE — Patient Instructions (Signed)
Stop drinking regular sodas, and eating regular syrup, and regular jello.

## 2018-06-04 ENCOUNTER — Telehealth: Payer: Self-pay | Admitting: Endocrinology

## 2018-06-04 ENCOUNTER — Ambulatory Visit (INDEPENDENT_AMBULATORY_CARE_PROVIDER_SITE_OTHER): Payer: BLUE CROSS/BLUE SHIELD | Admitting: Endocrinology

## 2018-06-04 VITALS — BP 136/84 | HR 100 | Ht 63.0 in | Wt 172.4 lb

## 2018-06-04 DIAGNOSIS — Z794 Long term (current) use of insulin: Secondary | ICD-10-CM

## 2018-06-04 DIAGNOSIS — E1149 Type 2 diabetes mellitus with other diabetic neurological complication: Secondary | ICD-10-CM

## 2018-06-04 MED ORDER — INSULIN DETEMIR 100 UNIT/ML FLEXPEN
20.0000 [IU] | PEN_INJECTOR | Freq: Every day | SUBCUTANEOUS | 0 refills | Status: DC
Start: 2018-06-04 — End: 2018-10-05

## 2018-06-04 MED ORDER — INSULIN ASPART 100 UNIT/ML FLEXPEN: 13.0000 [IU] | PEN_INJECTOR | Freq: Three times a day (TID) | SUBCUTANEOUS | 11 refills | Status: DC

## 2018-06-04 NOTE — Progress Notes (Signed)
Subjective:    Patient ID: Brittany [name], female    DOB: 1961-05-03, 57 y.o.   MRN: [id number]  HPI Pt returns for f/u of diabetes mellitus: DM type: Insulin-requiring type 2 Dx'ed: 1999 Complications: none Therapy: insulin since early 2019 GDM: 1987 DKA: never Severe hypoglycemia: never Pancreatitis: never Pancreatic imaging: normal on 2019 CT Other:  son gives pt her insulin (says he knows syringe and vial); she gets multiple daily injections Interval history: Pt says she will run out of insulin in approx 6 weeks, due to a gap in insurance.  no cbg record, but son says cbg varies from 60-300.  It is lowest with activity, and highest after a big meal.  Most are in the 100's.  No new sxs.   Past Medical History:  Diagnosis Date  . Chronic lower back pain   . Depression   . Fibromyalgia   . High cholesterol   . Seizures (HCC) 11/2017   "day after she came home from hospital after having stroke" (01/01/2018)  . Stroke [facility]) 12/06/2017   "speech issues since" (01/01/2018)  . Type II diabetes mellitus (HCC)     Past Surgical History:  Procedure Laterality Date  . BACK SURGERY    . CHOLECYSTECTOMY    . INSERT / REPLACE / REMOVE PACEMAKER  01/01/2018  . IR CT HEAD LTD  12/06/2017  . IR PERCUTANEOUS ART THROMBECTOMY/INFUSION INTRACRANIAL INC DIAG ANGIO  12/06/2017  . LOOP RECORDER INSERTION N/A 12/12/2017   Procedure: LOOP RECORDER INSERTION;  Surgeon: Regan Lemming, MD;  [facility];  Service: Cardiovascular;  Laterality: N/A;  . LOOP RECORDER REMOVAL  01/01/2018  . LOOP RECORDER REMOVAL N/A 01/01/2018   Procedure: LOOP RECORDER REMOVAL;  Surgeon: Regan Lemming, MD;  [facility];  Service: Cardiovascular;  Laterality: N/A;  . PACEMAKER IMPLANT N/A 01/01/2018   Procedure: PACEMAKER IMPLANT;  Surgeon: Regan Lemming, MD;  [facility];  Service: Cardiovascular;  Laterality: N/A;  . RADIOLOGY WITH ANESTHESIA N/A  12/06/2017   Procedure: CODE STROKE;  Surgeon: Julieanne Cotton, MD;  [facility]: [facility];  Service: Radiology;  Laterality: N/A;  . TEE WITHOUT CARDIOVERSION N/A 12/10/2017   Procedure: TRANSESOPHAGEAL ECHOCARDIOGRAM (TEE);  Surgeon: Jake Bathe, MD;  Location: [facility] ENDOSCOPY;  Service: Cardiovascular;  Laterality: N/A;  . THROMBECTOMY FEMORAL ARTERY Right 12/07/2017   Procedure: THROMBECTOMY RIGHT FEMORAL ARTERY;  Surgeon: Maeola Harman, MD;  Location: [facility] OR;  Service: Vascular;  Laterality: Right;  . TONSILLECTOMY      Social History   Socioeconomic History  . Marital status: Married    Spouse name: [name]  . Number of children: 2  . Years of education: 82  . Highest education level: Not on file  Occupational History  . Not on file  Social Needs  . Financial resource strain: Not on file  . Food insecurity:    Worry: Not on file    Inability: Not on file  . Transportation needs:    Medical: Not on file    Non-medical: Not on file  Tobacco Use  . Smoking status: Former Smoker    Packs/day: 1.00    Years: 38.00    Pack years: 38.00    Types: Cigarettes    Last attempt to quit: 12/06/2017    Years since quitting: 0.4  . Smokeless tobacco: Never Used  Substance and Sexual Activity  . Alcohol use: Yes    Comment: 01/01/2018 "nothing since  stroke 11/2017"  . Drug use: No  . Sexual activity: Not Currently  Lifestyle  . Physical activity:    Days per week: Not on file    Minutes per session: Not on file  . Stress: Not on file  Relationships  . Social connections:    Talks on phone: Not on file    Gets together: Not on file    Attends religious service: Not on file    Active member of club or organization: Not on file    Attends meetings of clubs or organizations: Not on file    Relationship status: Not on file  . Intimate partner violence:    Fear of current or ex partner: Not on file    Emotionally abused: Not on file    Physically abused: Not on file    Forced  sexual activity: Not on file  Other Topics Concern  . Not on file  Social History Narrative   Patient is married ([name]) and lives at home with her family   Patient has two children.   Patient works at [employer].   Patient has a college education.   Patient is right-handed.   Patient drinks 3 cups of coffee M-F.       Current Outpatient Medications on File Prior to Visit  Medication Sig Dispense Refill  . acetaminophen (TYLENOL) 500 MG tablet Take 1,000 mg by mouth every 6 (six) hours as needed for fever (pain).    Marland Kitchen albuterol (PROVENTIL) (2.5 MG/3ML) 0.083% nebulizer solution Take 3 mLs (2.5 mg total) by nebulization every 6 (six) hours as needed for wheezing or shortness of breath. 150 mL 1  . ARIPiprazole (ABILIFY) 2 MG tablet Take 1 tablet (2 mg total) by mouth daily. 30 tablet 4  . aspirin EC 81 MG tablet Take 81 mg by mouth daily.    Marland Kitchen atorvastatin (LIPITOR) 20 MG tablet Take 1 tablet (20 mg total) by mouth daily. 90 tablet 3  . B Complex-Biotin-FA (MULTI-B COMPLEX) CAPS Take one a day ,B complex  multivitamin over the counter or use prenatal vitamin. 90 capsule 0  . clopidogrel (PLAVIX) 75 MG tablet Take 1 tablet (75 mg total) by mouth daily. 30 tablet 11  . Continuous Blood Gluc Receiver (FREESTYLE LIBRE 14 DAY READER) DEVI 1 Units by Does not apply route 4 (four) times daily. 1 Device 0  . Continuous Blood Gluc Sensor (FREESTYLE LIBRE 14 DAY SENSOR) MISC Apply 1 Units topically every 14 (fourteen) days. 6 each 4  . irbesartan (AVAPRO) 150 MG tablet Take 1 tablet (150 mg total) by mouth daily. 90 tablet 1  . levETIRAcetam (KEPPRA) 500 MG tablet Take 1 tablet (500 mg total) by mouth 2 (two) times daily. 180 tablet 4  . sertraline (ZOLOFT) 100 MG tablet Take 2 tablets (200 mg total) by mouth at bedtime. 180 tablet 3   Current Facility-Administered Medications on File Prior to Visit  Medication Dose Route Frequency Provider Last Rate Last Dose  . gadopentetate dimeglumine  (MAGNEVIST) injection 15 mL  15 mL Intravenous Once PRN Dohmeier, Porfirio Mylar, MD        No Known Allergies  Family History  Problem Relation Age of Onset  . Cancer Mother   . Diabetes Mother   . Heart disease Mother   . Parkinsonism Mother   . Diabetes Father   . Heart disease Father   . Dementia Father   . Cancer Maternal Grandmother 62       lung  .  COPD Maternal Aunt     BP 136/84 (BP Location: Right Arm)   Pulse 100   Ht 5\' 3"  (1.6 m)   Wt 172 lb 6.4 oz (78.2 kg)   SpO2 95%   BMI 30.54 kg/m    Review of Systems Denies LOC    Objective:   Physical Exam VITAL SIGNS:  See vs page GENERAL: no distress Pulses: foot pulses are intact bilaterally.   MSK: no deformity of the feet or ankles.  CV: no edema of the legs or ankles Skin:  no ulcer on the feet or ankles.  normal color and temp on the feet and ankles Neuro: sensation is intact to touch on the feet and ankles.     Lab Results  Component Value Date   HGBA1C 8.5 (A) 05/04/2018       Assessment & Plan:  Insulin-requiring type 2 DM: she needs increased rx   Patient Instructions  check your blood sugar 4 times a day: before the 3 meals, and at bedtime.  also check if you have symptoms of your blood sugar being too high or too low.  please keep a record of the readings and bring it to your next appointment here (or you can bring the meter itself).  You can write it on any piece of paper.  please call us sooner if your blood sugar goes below 70, or if you have a lot of readings over 200.  Please continue the same insulins.  However, if you are going to be active, take just 8 units of the novolog.     Please come back for a follow-up appointment in 1 month.

## 2018-06-04 NOTE — Progress Notes (Signed)
  Subjective:     Patient ID: Brittany [name], female   DOB: [birth year], 59 y.o.   MRN: [id number]

## 2018-06-04 NOTE — Patient Instructions (Addendum)
check your blood sugar 4 times a day: before the 3 meals, and at bedtime.  also check if you have symptoms of your blood sugar being too high or too low.  please keep a record of the readings and bring it to your next appointment here (or you can bring the meter itself).  You can write it on any piece of paper.  please call us sooner if your blood sugar goes below 70, or if you have a lot of readings over 200.  Please continue the same insulins.  However, if you are going to be active, take just 8 units of the novolog.     Please come back for a follow-up appointment in 1 month.

## 2018-06-08 NOTE — Telephone Encounter (Signed)
error 

## 2018-07-01 DIAGNOSIS — Z0271 Encounter for disability determination: Secondary | ICD-10-CM

## 2018-07-06 ENCOUNTER — Ambulatory Visit (INDEPENDENT_AMBULATORY_CARE_PROVIDER_SITE_OTHER): Payer: BLUE CROSS/BLUE SHIELD | Admitting: *Deleted

## 2018-07-06 ENCOUNTER — Encounter: Payer: Self-pay | Admitting: Endocrinology

## 2018-07-06 ENCOUNTER — Ambulatory Visit (INDEPENDENT_AMBULATORY_CARE_PROVIDER_SITE_OTHER): Payer: BLUE CROSS/BLUE SHIELD | Admitting: Endocrinology

## 2018-07-06 VITALS — BP 136/80 | HR 106 | Ht 63.0 in | Wt 169.0 lb

## 2018-07-06 DIAGNOSIS — I442 Atrioventricular block, complete: Secondary | ICD-10-CM

## 2018-07-06 DIAGNOSIS — E1149 Type 2 diabetes mellitus with other diabetic neurological complication: Secondary | ICD-10-CM | POA: Diagnosis not present

## 2018-07-06 DIAGNOSIS — I495 Sick sinus syndrome: Secondary | ICD-10-CM

## 2018-07-06 DIAGNOSIS — Z794 Long term (current) use of insulin: Secondary | ICD-10-CM

## 2018-07-06 LAB — POCT GLYCOSYLATED HEMOGLOBIN (HGB A1C): Hemoglobin A1C: 7.3 % — AB (ref 4.0–5.6)

## 2018-07-06 NOTE — Patient Instructions (Addendum)
check your blood sugar 4 times a day: before the 3 meals, and at bedtime.  also check if you have symptoms of your blood sugar being too high or too low.  please keep a record of the readings and bring it to your next appointment here (or you can bring the meter itself).  You can write it on any piece of paper.  please call us sooner if your blood sugar goes below 70, or if you have a lot of readings over 200.  Please continue the same insulins.  However, if you are going to be active, take just 8 units of the novolog.     Please come back for a follow-up appointment in 3 months.

## 2018-07-06 NOTE — Progress Notes (Signed)
Subjective:    Patient ID: Brittany [name], female    DOB: 10/27/1960, 57 y.o.   MRN: [id number]  HPI Pt returns for f/u of diabetes mellitus: DM type: Insulin-requiring type 2 Dx'ed: 1999 Complications: CVA Therapy: insulin since early 2019 GDM: 1987 DKA: never Severe hypoglycemia: never Pancreatitis: never Pancreatic imaging: normal on 2019 CT Other:  son gives pt her insulin (due to CVA--he says he knows how to use syringe and vial); she gets multiple daily injections.   Interval history: Pt says she will run out of insulin in approx 6 weeks, due to a gap in insurance.  no cbg record, but son says cbg varies from 100-200's.  There is no trend throughout the day.  Most are in the 100's.  No new sxs.  Past Medical History:  Diagnosis Date  . Chronic lower back pain   . Depression   . Fibromyalgia   . High cholesterol   . Seizures (HCC) 11/2017   "day after she came home from hospital after having stroke" (01/01/2018)  . Stroke [facility]) 12/06/2017   "speech issues since" (01/01/2018)  . Type II diabetes mellitus (HCC)     Past Surgical History:  Procedure Laterality Date  . BACK SURGERY    . CHOLECYSTECTOMY    . INSERT / REPLACE / REMOVE PACEMAKER  01/01/2018  . IR CT HEAD LTD  12/06/2017  . IR PERCUTANEOUS ART THROMBECTOMY/INFUSION INTRACRANIAL INC DIAG ANGIO  12/06/2017  . LOOP RECORDER INSERTION N/A 12/12/2017   Procedure: LOOP RECORDER INSERTION;  Surgeon: Regan Lemming, MD;  [facility];  Service: Cardiovascular;  Laterality: N/A;  . LOOP RECORDER REMOVAL  01/01/2018  . LOOP RECORDER REMOVAL N/A 01/01/2018   Procedure: LOOP RECORDER REMOVAL;  Surgeon: Regan Lemming, MD;  [facility];  Service: Cardiovascular;  Laterality: N/A;  . PACEMAKER IMPLANT N/A 01/01/2018   Procedure: PACEMAKER IMPLANT;  Surgeon: Regan Lemming, MD;  [facility];  Service: Cardiovascular;  Laterality: N/A;  . RADIOLOGY WITH  ANESTHESIA N/A 12/06/2017   Procedure: CODE STROKE;  Surgeon: Julieanne Cotton, MD;  Location[facility] OR;  Service: Radiology;  Laterality: N/A;  . TEE WITHOUT CARDIOVERSION N/A 12/10/2017   Procedure: TRANSESOPHAGEAL ECHOCARDIOGRAM (TEE);  Surgeon: Jake Bathe, MD;  Location: [facility] ENDOSCOPY;  Service: Cardiovascular;  Laterality: N/A;  . THROMBECTOMY FEMORAL ARTERY Right 12/07/2017   Procedure: THROMBECTOMY RIGHT FEMORAL ARTERY;  Surgeon: Maeola Harman, MD;  Location: [facility] OR;  Service: Vascular;  Laterality: Right;  . TONSILLECTOMY      Social History   Socioeconomic History  . Marital status: Married    Spouse name: [name]  . Number of children: 2  . Years of education: 29  . Highest education level: Not on file  Occupational History  . Not on file  Social Needs  . Financial resource strain: Not on file  . Food insecurity:    Worry: Not on file    Inability: Not on file  . Transportation needs:    Medical: Not on file    Non-medical: Not on file  Tobacco Use  . Smoking status: Former Smoker    Packs/day: 1.00    Years: 38.00    Pack years: 38.00    Types: Cigarettes    Last attempt to quit: 12/06/2017    Years since quitting: 0.5  . Smokeless tobacco: Never Used  Substance and Sexual Activity  . Alcohol use: Yes    Comment:  01/01/2018 "nothing since stroke 11/2017"  . Drug use: No  . Sexual activity: Not Currently  Lifestyle  . Physical activity:    Days per week: Not on file    Minutes per session: Not on file  . Stress: Not on file  Relationships  . Social connections:    Talks on phone: Not on file    Gets together: Not on file    Attends religious service: Not on file    Active member of club or organization: Not on file    Attends meetings of clubs or organizations: Not on file    Relationship status: Not on file  . Intimate partner violence:    Fear of current or ex partner: Not on file    Emotionally abused: Not on file    Physically abused: Not on  file    Forced sexual activity: Not on file  Other Topics Concern  . Not on file  Social History Narrative   Patient is married ([name]) and lives at home with her family   Patient has two children.   Patient works at [employer].   Patient has a college education.   Patient is right-handed.   Patient drinks 3 cups of coffee M-F.       Current Outpatient Medications on File Prior to Visit  Medication Sig Dispense Refill  . acetaminophen (TYLENOL) 500 MG tablet Take 1,000 mg by mouth every 6 (six) hours as needed for fever (pain).    Marland Kitchen albuterol (PROVENTIL) (2.5 MG/3ML) 0.083% nebulizer solution Take 3 mLs (2.5 mg total) by nebulization every 6 (six) hours as needed for wheezing or shortness of breath. 150 mL 1  . ARIPiprazole (ABILIFY) 2 MG tablet Take 1 tablet (2 mg total) by mouth daily. 30 tablet 4  . aspirin EC 81 MG tablet Take 81 mg by mouth daily.    Marland Kitchen atorvastatin (LIPITOR) 20 MG tablet Take 1 tablet (20 mg total) by mouth daily. 90 tablet 3  . B Complex-Biotin-FA (MULTI-B COMPLEX) CAPS Take one a day ,B complex  multivitamin over the counter or use prenatal vitamin. 90 capsule 0  . clopidogrel (PLAVIX) 75 MG tablet Take 1 tablet (75 mg total) by mouth daily. 30 tablet 11  . Continuous Blood Gluc Receiver (FREESTYLE LIBRE 14 DAY READER) DEVI 1 Units by Does not apply route 4 (four) times daily. 1 Device 0  . Continuous Blood Gluc Sensor (FREESTYLE LIBRE 14 DAY SENSOR) MISC Apply 1 Units topically every 14 (fourteen) days. 6 each 4  . insulin aspart (NOVOLOG FLEXPEN) 100 UNIT/ML FlexPen Inject 13 Units into the skin 3 (three) times daily with meals. And pen needles 4/day 15 mL 11  . Insulin Detemir (LEVEMIR FLEXTOUCH) 100 UNIT/ML Pen Inject 20 Units into the skin at bedtime. 45 mL 0  . irbesartan (AVAPRO) 150 MG tablet Take 1 tablet (150 mg total) by mouth daily. 90 tablet 1  . levETIRAcetam (KEPPRA) 500 MG tablet Take 1 tablet (500 mg total) by mouth 2 (two) times daily.  180 tablet 4  . sertraline (ZOLOFT) 100 MG tablet Take 2 tablets (200 mg total) by mouth at bedtime. 180 tablet 3   Current Facility-Administered Medications on File Prior to Visit  Medication Dose Route Frequency Provider Last Rate Last Dose  . gadopentetate dimeglumine (MAGNEVIST) injection 15 mL  15 mL Intravenous Once PRN Dohmeier, Porfirio Mylar, MD        No Known Allergies  Family History  Problem Relation Age of Onset  .  Cancer Mother   . Diabetes Mother   . Heart disease Mother   . Parkinsonism Mother   . Diabetes Father   . Heart disease Father   . Dementia Father   . Cancer Maternal Grandmother 62       lung  . COPD Maternal Aunt     BP 136/80   Pulse (!) 106   Ht 5\' 3"  (1.6 m)   Wt 169 lb (76.7 kg)   SpO2 96%   BMI 29.94 kg/m    Review of Systems She denies hypoglycemia.      Objective:   Physical Exam VITAL SIGNS:  See vs page GENERAL: no distress Pulses: foot pulses are intact bilaterally.   MSK: no deformity of the feet or ankles.  CV: no edema of the legs or ankles Skin:  no ulcer on the feet or ankles.  normal color and temp on the feet and ankles Neuro: sensation is intact to touch on the feet and ankles.     Lab Results  Component Value Date   HGBA1C 7.3 (A) 07/06/2018   Lab Results  Component Value Date   CREATININE 0.55 (L) 02/26/2018   BUN 12 02/26/2018   NA 136 02/26/2018   K 4.4 02/26/2018   CL 102 02/26/2018   CO2 21 02/26/2018       Assessment & Plan:  Insulin-requiring type 2 DM, with CVA: this is the best control this pt should aim for, given variable cbg's.  she'll buy R and N from [facility] if no insurance.  Patient Instructions  check your blood sugar 4 times a day: before the 3 meals, and at bedtime.  also check if you have symptoms of your blood sugar being too high or too low.  please keep a record of the readings and bring it to your next appointment here (or you can bring the meter itself).  You can write it on any piece of  paper.  please call us sooner if your blood sugar goes below 70, or if you have a lot of readings over 200.  Please continue the same insulins.  However, if you are going to be active, take just 8 units of the novolog.     Please come back for a follow-up appointment in 3 months.

## 2018-07-07 NOTE — Progress Notes (Signed)
Remote pacemaker transmission.   

## 2018-07-12 ENCOUNTER — Other Ambulatory Visit: Payer: Self-pay | Admitting: Family Medicine

## 2018-07-13 NOTE — Telephone Encounter (Signed)
Patient called, advised to call the neurologist office to refill the Keppra, since that's who is prescribing, she verbalized understanding. I asked her if she would like to schedule an OV with Dr. Clelia Croft to follow up for medication refill that are coming due, she verbalized understanding and asks for a Friday after lunch. Appointment scheduled for Friday, 08/28/18 at 1430 with Dr. Clelia Croft.

## 2018-07-17 ENCOUNTER — Other Ambulatory Visit: Payer: Self-pay | Admitting: Family Medicine

## 2018-07-17 NOTE — Telephone Encounter (Signed)
Requested medication (s) are due for refill today: Yes  Requested medication (s) are on the active medication list: Yes  Last refill:  04/15/18  Future visit scheduled: Yes  Notes to clinic:  Unable to refill per protocol     Requested Prescriptions  Pending Prescriptions Disp Refills   levETIRAcetam (KEPPRA) 500 MG tablet 180 tablet 4    Sig: Take 1 tablet (500 mg total) by mouth 2 (two) times daily.     Not Delegated - Neurology:  Anticonvulsants Failed - 07/17/2018  3:50 PM      Failed - This refill cannot be delegated      Passed - HCT in normal range and within 360 days    HCT  Date Value Ref Range Status  01/01/2018 39.9 36.0 - 46.0 % Final   Hematocrit  Date Value Ref Range Status  08/28/2016 40.1 34.0 - 46.6 % Final         Passed - HGB in normal range and within 360 days    Hemoglobin  Date Value Ref Range Status  01/01/2018 13.4 12.0 - 15.0 g/dL Final  16/06/9603 54.0 11.1 - 15.9 g/dL Final         Passed - PLT in normal range and within 360 days    Platelets  Date Value Ref Range Status  01/01/2018 210 150 - 400 K/uL Final    Comment:    Performed at [facility], 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

## 2018-07-17 NOTE — Telephone Encounter (Signed)
Copied from CRM [id number]. Topic: Quick Communication - Rx Refill/Question >> Jul 17, 2018  3:39 PM Jaquita Rector A wrote: Medication: levETIRAcetam (KEPPRA) 500 MG tablet   Patient has one for tonight and for tomorrow  Has the patient contacted their pharmacy? Yes   Preferred Pharmacy (with phone number or street name): [id number] - [place], [place] - [address] [phone] (Phone) [phone] (Fax)    Agent: Please be advised that RX refills may take up to 3 business days. We ask that you follow-up with your pharmacy.

## 2018-07-20 ENCOUNTER — Other Ambulatory Visit: Payer: Self-pay | Admitting: Family Medicine

## 2018-07-21 NOTE — Telephone Encounter (Signed)
Requested medication (s) are due for refill today: ?  Requested medication (s) are on the active medication list: yes  Last refill:  04/10/18  Future visit scheduled: yes  Notes to clinic:  Last ordered by George Hugh, NP; is [place] managing this medication     Requested Prescriptions  Pending Prescriptions Disp Refills   levETIRAcetam (KEPPRA) 500 MG tablet [Pharmacy Med Name: LEVETIRACETAM 500 MG TABLET] 180 tablet 0    Sig: TAKE 1 TABLET BY MOUTH TWICE A DAY     There is no refill protocol information for this order

## 2018-07-22 ENCOUNTER — Telehealth: Payer: Self-pay | Admitting: Adult Health

## 2018-07-22 NOTE — Telephone Encounter (Signed)
Pts husband Jonny Ru[place]John on [place] requesting a refill for levETIRAcetam (KEPPRA) 500 MG [facility]

## 2018-07-22 NOTE — Telephone Encounter (Signed)
RN call patients husband that his wife was given a [facility] in 04/2018 by Shanda BumpsJessica NP. Rn stated she was given a 3 month supply at a time. The husband stated [facility] stated [facility]. The husband states the pill bottle has Norberto SorensonEVa Shaw on it which is her PCP. Rn stated Shanda BumpsJessica NP last prescribed her keppra in 04/2018 for a year. The husband stated pt has been out for 3 days. The husband will call [facility] pharmacy.

## 2018-07-22 NOTE — Telephone Encounter (Signed)
RN call patients husband on phone. Rn stated when he call the pharmacy [place]. RN stated sometimes they dont know about the refills for pt. RN stated to husband per pharmacy tech that medication should be ready after 400pm.The pharmacy has to review the rx. The husband verbalized understanding.

## 2018-07-22 NOTE — Telephone Encounter (Signed)
RN call [facility] spoke to the pharmacy tech. She stated the pharmacy has to check the rx with the the dosage and directions. THE tech stated the medication should be ready this afternoon. The husband can come after 300pm.

## 2018-07-28 ENCOUNTER — Telehealth: Payer: Self-pay | Admitting: Adult Health

## 2018-07-28 NOTE — Telephone Encounter (Signed)
Please advise son that wax/waning of symptoms do occur after stroke especially if patient is fatigued, increased stress or change of emotion.  Please ensure that she has not had any new symptoms that would possibly warrant further evaluation.  It does appear as though speech therapy was stopped in 05/2018.  Please ensure that patient continues to do speech therapy exercises at home as she can see possible worsening if she has not been doing exercises.

## 2018-07-28 NOTE — Telephone Encounter (Signed)
Patient's son-in-law Joey(on DPR) states x2 weeks patient is having TIA symptoms. She is having problems with slurred speech & right side of face sort of droops. He does not feel she needs to go to the ED. Please call and discuss.

## 2018-07-29 NOTE — Telephone Encounter (Signed)
RN cannot speak with Brittany [name] pts son [facility].

## 2018-07-29 NOTE — Telephone Encounter (Signed)
RN call pts husband who is on dpr. Rn stated per [place] NP, Pts husband stated her speech gets worse at times.  He also stated pt is having problems with her hand at times.The therapy ended in 05/2018.Rn stated if pt has increase fatigue, increase stress triggers or emotions can warrant symptoms. RN ask if patient was doing home exercises, for ST and OT that was recommend when it ended.The husband stated his wife did not get any home exercises at all to do while home, also she is not doing any. Rn recommend they call or go in person to get a printout of recommendations from neur rehab. Rn stated a message will be [facility] for more review. The husband verbalized understanding.

## 2018-07-29 NOTE — Telephone Encounter (Signed)
Noted! Thank you

## 2018-08-06 ENCOUNTER — Other Ambulatory Visit: Payer: Self-pay | Admitting: Family Medicine

## 2018-08-07 ENCOUNTER — Telehealth: Payer: Self-pay | Admitting: Family Medicine

## 2018-08-07 NOTE — Telephone Encounter (Signed)
Requested Prescriptions  Pending Prescriptions Disp Refills  . ARIPiprazole (ABILIFY) 2 MG tablet [Pharmacy Med Name: ARIPIPRAZOLE 2MG  TABLETS] 30 tablet 0    Sig: TAKE 1 TABLET BY MOUTH DAILY     Not Delegated - Psychiatry:  Antipsychotics - Second Generation (Atypical) - aripiprazole Failed - 08/06/2018  6:05 AM      Failed - This refill cannot be delegated      Passed - Valid encounter within last 6 months    Recent Outpatient Visits          5 months ago Type 2 diabetes mellitus with other neurologic complication, with long-term current use of insulin [employer](HCC)   [facility], Levell JulyEva N, MD   6 months ago 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 [facility](HCC) s/p IV tPA and mechanical intervention   [facility], Levell JulyEva N, MD   7 months ago Middle cerebral artery embolism, left   [facility], Levell JulyEva N, MD   7 months ago Constipation, unspecified constipation type   [facility], [place]   1 year ago Pruritic rash   [facility], Levell JulyEva N, MD      Future Appointments            In 3 weeks Sherren MochaShaw, [name], MD [facility]         . irbesartan (AVAPRO) 150 MG tablet [Pharmacy Med Name: IRBESARTAN 150MG  TABLETS] 90 tablet 0    Sig: TAKE 1 TABLET(150 MG) BY MOUTH DAILY     Cardiovascular:  Angiotensin Receptor Blockers Failed - 08/06/2018  6:05 AM      Failed - Cr in normal range and within 180 days    Creat  Date Value Ref Range Status  07/13/2016 0.59 0.50 - 1.05 mg/dL Final    Comment:      For patients > or = 59 years of age: The upper reference limit for Creatinine is approximately 13% higher for people identified as African-American.      Creatinine, Ser  Date Value Ref Range Status  02/26/2018 0.55 (L) 0.57 - 1.00 mg/dL Final         Passed - K in normal range and within 180 days    Potassium  Date Value Ref Range Status  02/26/2018 4.4 3.5 - 5.2 mmol/L Final         Passed -  Patient is not pregnant      Passed - Last BP in normal range    BP Readings from Last 1 Encounters:  07/06/18 136/80         Passed - Valid encounter within last 6 months    Recent Outpatient Visits          5 months ago Type 2 diabetes mellitus with other neurologic complication, with long-term current use of insulin [facility](HCC)   [facility], Levell JulyEva N, MD   6 months ago 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 [facility](HCC) s/p IV tPA and mechanical intervention   [facility], Levell JulyEva N, MD   7 months ago Middle cerebral artery embolism, left   [facility], Levell JulyEva N, MD   7 months ago Constipation, unspecified constipation type   [facility], [place]   1 year ago Pruritic rash   [facility], Levell JulyEva N, MD      Future Appointments  In 3 weeks [name], MD Primary Care at [place], [facility]

## 2018-08-07 NOTE — Telephone Encounter (Signed)
Accidentally approved Abilify. Called the [facility] this morning and was [facility] send a message back to the practice.

## 2018-08-08 NOTE — Telephone Encounter (Signed)
Dr Clelia Croftshaw advise.  FYI.

## 2018-08-18 ENCOUNTER — Telehealth: Payer: Self-pay

## 2018-08-18 NOTE — Telephone Encounter (Signed)
Patient paid 50.00 for forms. Long term disability done by Dr. Pearlean BrownieSEthi. Pt has to sign form before it can be fax. Given to Stanton KidneyDebra to call pt for signature. Office notes attach to fax with LTD forms.

## 2018-08-19 DIAGNOSIS — Z0289 Encounter for other administrative examinations: Secondary | ICD-10-CM

## 2018-08-19 NOTE — Telephone Encounter (Signed)
Pt Guardian form faxed on 08/19/18.

## 2018-08-24 DIAGNOSIS — Z0289 Encounter for other administrative examinations: Secondary | ICD-10-CM

## 2018-08-28 ENCOUNTER — Ambulatory Visit (INDEPENDENT_AMBULATORY_CARE_PROVIDER_SITE_OTHER): Payer: BLUE CROSS/BLUE SHIELD | Admitting: Family Medicine

## 2018-08-28 DIAGNOSIS — E1149 Type 2 diabetes mellitus with other diabetic neurological complication: Secondary | ICD-10-CM | POA: Diagnosis not present

## 2018-08-28 DIAGNOSIS — Z794 Long term (current) use of insulin: Secondary | ICD-10-CM | POA: Diagnosis not present

## 2018-08-28 LAB — POCT URINALYSIS DIP (MANUAL ENTRY)
Bilirubin, UA: NEGATIVE
Ketones, POC UA: NEGATIVE mg/dL
NITRITE UA: NEGATIVE
Protein Ur, POC: NEGATIVE mg/dL
Urobilinogen, UA: 0.2 E.U./dL
pH, UA: 5.5 (ref 5.0–8.0)

## 2018-08-28 NOTE — Telephone Encounter (Signed)
Not sure what the problem was - this is being used as an antidepressant that needed to be refilled so was done appropriately. Thank you for handing this appropriately and refilling

## 2018-08-29 LAB — LIPID PANEL
CHOLESTEROL TOTAL: 169 mg/dL (ref 100–199)
Chol/HDL Ratio: 3.3 ratio (ref 0.0–4.4)
HDL: 51 mg/dL (ref >39)
LDL Calculated: 79 mg/dL (ref 0–99)
Triglycerides: 193 mg/dL — ABNORMAL HIGH (ref 0–149)
VLDL CHOLESTEROL CAL: 39 mg/dL (ref 5–40)

## 2018-08-29 LAB — COMPREHENSIVE METABOLIC PANEL
ALBUMIN: 4.3 g/dL (ref 3.5–5.5)
ALK PHOS: 131 IU/L — AB (ref 39–117)
ALT: 13 IU/L (ref 0–32)
AST: 14 IU/L (ref 0–40)
Albumin/Globulin Ratio: 1.5 (ref 1.2–2.2)
BUN / CREAT RATIO: 21 (ref 9–23)
BUN: 16 mg/dL (ref 6–24)
Bilirubin Total: 0.3 mg/dL (ref 0.0–1.2)
CALCIUM: 10.5 mg/dL — AB (ref 8.7–10.2)
CO2: 21 mmol/L (ref 20–29)
CREATININE: 0.78 mg/dL (ref 0.57–1.00)
Chloride: 108 mmol/L — ABNORMAL HIGH (ref 96–106)
GFR calc Af Amer: 96 mL/min/{1.73_m2} (ref >59)
GFR calc non Af Amer: 83 mL/min/{1.73_m2} (ref >59)
GLOBULIN, TOTAL: 2.8 g/dL (ref 1.5–4.5)
GLUCOSE: 95 mg/dL (ref 65–99)
Potassium: 4.5 mmol/L (ref 3.5–5.2)
SODIUM: 146 mmol/L — AB (ref 134–144)
TOTAL PROTEIN: 7.1 g/dL (ref 6.0–8.5)

## 2018-08-29 LAB — MICROALBUMIN / CREATININE URINE RATIO
CREATININE, UR: 143.6 mg/dL
MICROALB/CREAT RATIO: 18.9 mg/g{creat} (ref 0.0–30.0)
Microalbumin, Urine: 27.1 ug/mL

## 2018-08-30 LAB — CUP PACEART REMOTE DEVICE CHECK
Battery Remaining Longevity: 180 mo
Brady Statistic AP VS Percent: 0.26 %
Brady Statistic AS VP Percent: 0.64 %
Brady Statistic AS VS Percent: 98.46 %
Date Time Interrogation Session: 20191028110400
Implantable Lead Implant Date: 20190425
Implantable Lead Location: 753860
Implantable Lead Model: 5076
Implantable Lead Model: 5076
Implantable Pulse Generator Implant Date: 20190425
Lead Channel Impedance Value: 342 Ohm
Lead Channel Impedance Value: 608 Ohm
Lead Channel Pacing Threshold Amplitude: 0.5 V
Lead Channel Pacing Threshold Pulse Width: 0.4 ms
Lead Channel Sensing Intrinsic Amplitude: 1.625 mV
Lead Channel Sensing Intrinsic Amplitude: 1.625 mV
Lead Channel Sensing Intrinsic Amplitude: 21.625 mV
Lead Channel Sensing Intrinsic Amplitude: 21.625 mV
Lead Channel Setting Pacing Amplitude: 2.5 V
Lead Channel Setting Pacing Pulse Width: 0.4 ms
MDC IDC LEAD IMPLANT DT: 20190425
MDC IDC LEAD LOCATION: 753859
MDC IDC MSMT BATTERY VOLTAGE: 3.17 V
MDC IDC MSMT LEADCHNL RA PACING THRESHOLD AMPLITUDE: 1 V
MDC IDC MSMT LEADCHNL RA PACING THRESHOLD PULSEWIDTH: 0.4 ms
MDC IDC MSMT LEADCHNL RV IMPEDANCE VALUE: 437 Ohm
MDC IDC MSMT LEADCHNL RV IMPEDANCE VALUE: 513 Ohm
MDC IDC SET LEADCHNL RA PACING AMPLITUDE: 2 V
MDC IDC SET LEADCHNL RV SENSING SENSITIVITY: 2 mV
MDC IDC STAT BRADY AP VP PERCENT: 0.63 %
MDC IDC STAT BRADY RA PERCENT PACED: 0.99 %
MDC IDC STAT BRADY RV PERCENT PACED: 1.27 %

## 2018-09-02 ENCOUNTER — Other Ambulatory Visit: Payer: Self-pay | Admitting: Family Medicine

## 2018-09-03 ENCOUNTER — Ambulatory Visit (INDEPENDENT_AMBULATORY_CARE_PROVIDER_SITE_OTHER): Payer: BLUE CROSS/BLUE SHIELD | Admitting: Family Medicine

## 2018-09-03 ENCOUNTER — Other Ambulatory Visit: Payer: Self-pay

## 2018-09-03 ENCOUNTER — Encounter: Payer: Self-pay | Admitting: Family Medicine

## 2018-09-03 VITALS — BP 121/61 | HR 79 | Temp 98.0°F | Resp 16 | Ht 64.57 in | Wt 173.0 lb

## 2018-09-03 DIAGNOSIS — G251 Drug-induced tremor: Secondary | ICD-10-CM | POA: Diagnosis not present

## 2018-09-03 DIAGNOSIS — R251 Tremor, unspecified: Secondary | ICD-10-CM

## 2018-09-03 DIAGNOSIS — G250 Essential tremor: Secondary | ICD-10-CM

## 2018-09-03 DIAGNOSIS — R82998 Other abnormal findings in urine: Secondary | ICD-10-CM

## 2018-09-03 LAB — POC MICROSCOPIC URINALYSIS (UMFC): Mucus: ABSENT

## 2018-09-03 LAB — POCT URINALYSIS DIP (MANUAL ENTRY)
Bilirubin, UA: NEGATIVE
Blood, UA: NEGATIVE
Glucose, UA: 100 mg/dL — AB
Ketones, POC UA: NEGATIVE mg/dL
NITRITE UA: NEGATIVE
Protein Ur, POC: NEGATIVE mg/dL
Spec Grav, UA: 1.025 (ref 1.010–1.025)
Urobilinogen, UA: 0.2 E.U./dL
pH, UA: 5 (ref 5.0–8.0)

## 2018-09-03 MED ORDER — IRBESARTAN 150 MG PO TABS: ORAL_TABLET | ORAL | 3 refills | Status: DC

## 2018-09-03 MED ORDER — CLOPIDOGREL BISULFATE 75 MG PO TABS: 75.0000 mg | ORAL_TABLET | Freq: Every day | ORAL | 3 refills | Status: DC

## 2018-09-03 MED ORDER — PRIMIDONE 50 MG PO TABS: ORAL_TABLET | ORAL | 0 refills | Status: DC

## 2018-09-03 MED ORDER — ATORVASTATIN CALCIUM 40 MG PO TABS: 40.0000 mg | ORAL_TABLET | Freq: Every day | ORAL | 1 refills | Status: DC

## 2018-09-03 NOTE — Progress Notes (Signed)
Subjective:    Patient: Brittany [name]  DOB: Jun 12, 1961; 57 y.o.   MRN: [id number]  Chief Complaint  Patient presents with  . Diabetes    follow-up     HPI Seeing Dr. Everardo All to follow DM.  Tremors have been worsening since her CVA. She contacted her neurologist weekly with her concerns but has not heard back from them. Things are actively worsening recently.  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 Is not   Mood ok.      Medical History Past Medical History:  Diagnosis Date  . Chronic lower back pain   . Depression   . Fibromyalgia   . High cholesterol   . Seizures (HCC) 11/2017   "day after she came home from hospital after having stroke" (01/01/2018)  . Stroke [facility]) 12/06/2017   "speech issues since" (01/01/2018)  . Type II diabetes mellitus (HCC)    Past Surgical History:  Procedure Laterality Date  . BACK SURGERY    . CHOLECYSTECTOMY    . INSERT / REPLACE / REMOVE PACEMAKER  01/01/2018  . IR CT HEAD LTD  12/06/2017  . IR PERCUTANEOUS ART THROMBECTOMY/INFUSION INTRACRANIAL INC DIAG ANGIO  12/06/2017  . LOOP RECORDER INSERTION N/A 12/12/2017   Procedure: LOOP RECORDER INSERTION;  Surgeon: Regan Lemming, MD;  [facility]: [facility] INVASIVE CV LAB;  Service: Cardiovascular;  Laterality: N/A;  . LOOP RECORDER REMOVAL  01/01/2018  . LOOP RECORDER REMOVAL N/A 01/01/2018   Procedure: LOOP RECORDER REMOVAL;  Surgeon: Regan Lemming, MD;  Location: [facility] INVASIVE CV LAB;  Service: Cardiovascular;  Laterality: N/A;  . PACEMAKER IMPLANT N/A 01/01/2018   Procedure: PACEMAKER IMPLANT;  Surgeon: Regan Lemming, MD;  Location: [facility] INVASIVE CV LAB;  Service: Cardiovascular;  Laterality: N/A;  . RADIOLOGY WITH ANESTHESIA N/A 12/06/2017   Procedure: CODE STROKE;  Surgeon: Julieanne Cotton, MD;  Location: [facility] OR;  Service: Radiology;  Laterality: N/A;  . TEE WITHOUT CARDIOVERSION N/A 12/10/2017   Procedure: TRANSESOPHAGEAL ECHOCARDIOGRAM (TEE);  Surgeon: Jake Bathe, MD;  Location: [facility]  ENDOSCOPY;  Service: Cardiovascular;  Laterality: N/A;  . THROMBECTOMY FEMORAL ARTERY Right 12/07/2017   Procedure: THROMBECTOMY RIGHT FEMORAL ARTERY;  Surgeon: Maeola Harman, MD;  [facility] OR;  Service: Vascular;  Laterality: Right;  . TONSILLECTOMY     Current Outpatient Medications on File Prior to Visit  Medication Sig Dispense Refill  . acetaminophen (TYLENOL) 500 MG tablet Take 1,000 mg by mouth every 6 (six) hours as needed for fever (pain).    Marland Kitchen albuterol (PROVENTIL) (2.5 MG/3ML) 0.083% nebulizer solution Take 3 mLs (2.5 mg total) by nebulization every 6 (six) hours as needed for wheezing or shortness of breath. 150 mL 1  . ARIPiprazole (ABILIFY) 2 MG tablet TAKE 1 TABLET BY MOUTH DAILY 30 tablet 0  . aspirin EC 81 MG tablet Take 81 mg by mouth daily.    Marland Kitchen atorvastatin (LIPITOR) 20 MG tablet Take 1 tablet (20 mg total) by mouth daily. 90 tablet 3  . B Complex-Biotin-FA (MULTI-B COMPLEX) CAPS Take one a day ,B complex  multivitamin over the counter or use prenatal vitamin. 90 capsule 0  . clopidogrel (PLAVIX) 75 MG tablet Take 1 tablet (75 mg total) by mouth daily. 30 tablet 11  . Continuous Blood Gluc Receiver (FREESTYLE LIBRE 14 DAY READER) DEVI 1 Units by Does not apply route 4 (four) times daily. 1 Device 0  . Continuous Blood Gluc Sensor (FREESTYLE LIBRE 14 DAY SENSOR) MISC Apply 1 Units topically every 14 (  fourteen) days. 6 each 4  . insulin aspart (NOVOLOG FLEXPEN) 100 UNIT/ML FlexPen Inject 13 Units into the skin 3 (three) times daily with meals. And pen needles 4/day 15 mL 11  . Insulin Detemir (LEVEMIR FLEXTOUCH) 100 UNIT/ML Pen Inject 20 Units into the skin at bedtime. 45 mL 0  . irbesartan (AVAPRO) 150 MG tablet TAKE 1 TABLET(150 MG) BY MOUTH DAILY 90 tablet 0  . levETIRAcetam (KEPPRA) 500 MG tablet Take 1 tablet (500 mg total) by mouth 2 (two) times daily. 180 tablet 4  . sertraline (ZOLOFT) 100 MG tablet Take 2 tablets (200 mg total) by mouth at bedtime. 180  tablet 3   Current Facility-Administered Medications on File Prior to Visit  Medication Dose Route Frequency Provider Last Rate Last Dose  . gadopentetate dimeglumine (MAGNEVIST) injection 15 mL  15 mL Intravenous Once PRN Dohmeier, Porfirio Mylar, MD       No Known Allergies Family History  Problem Relation Age of Onset  . Cancer Mother   . Diabetes Mother   . Heart disease Mother   . Parkinsonism Mother   . Diabetes Father   . Heart disease Father   . Dementia Father   . Cancer Maternal Grandmother 62       lung  . COPD Maternal Aunt    Social History   Socioeconomic History  . Marital status: Married    Spouse name: [name]  . Number of children: 2  . Years of education: 55  . Highest education level: Not on file  Occupational History  . Not on file  Social Needs  . Financial resource strain: Not on file  . Food insecurity:    Worry: Not on file    Inability: Not on file  . Transportation needs:    Medical: Not on file    Non-medical: Not on file  Tobacco Use  . Smoking status: Former Smoker    Packs/day: 1.00    Years: 38.00    Pack years: 38.00    Types: Cigarettes    Last attempt to quit: 12/06/2017    Years since quitting: 0.7  . Smokeless tobacco: Never Used  Substance and Sexual Activity  . Alcohol use: Yes    Comment: 01/01/2018 "nothing since stroke 11/2017"  . Drug use: No  . Sexual activity: Not Currently  Lifestyle  . Physical activity:    Days per week: Not on file    Minutes per session: Not on file  . Stress: Not on file  Relationships  . Social connections:    Talks on phone: Not on file    Gets together: Not on file    Attends religious service: Not on file    Active member of club or organization: Not on file    Attends meetings of clubs or organizations: Not on file    Relationship status: Not on file  Other Topics Concern  . Not on file  Social History Narrative   Patient is married ([name]) and lives at home with her family   Patient has two  children.   Patient works at [employer].   Patient has a college education.   Patient is right-handed.   Patient drinks 3 cups of coffee M-F.      Depression screen [facility] 2/9 09/03/2018 02/26/2018 01/23/2018 12/25/2017 12/19/2017  Decreased Interest 0 (No Data) 0 0 0  Down, Depressed, Hopeless 0 - 0 0 0  PHQ - 2 Score 0 - 0 0 0  Altered sleeping - - - - -  Tired, decreased energy - - - - -  Change in appetite - - - - -  Feeling bad or failure about yourself  - - - - -  Trouble concentrating - - - - -  Moving slowly or fidgety/restless - - - - -  Suicidal thoughts - - - - -  PHQ-9 Score - - - - -  Difficult doing work/chores - - - - -    ROS As noted in HPI  Objective:  BP 121/61   Pulse 79   Temp 98 F (36.7 C) (Oral)   Resp 16   Ht 5' 4.57" (1.64 m)   Wt 173 lb (78.5 kg)   SpO2 98%   BMI 29.18 kg/m  Physical Exam Constitutional:      General: She is not in acute distress.    Appearance: She is well-developed. She is not diaphoretic.  HENT:     Head: Normocephalic and atraumatic.     Right Ear: External ear normal.     Left Ear: External ear normal.  Eyes:     General: No scleral icterus.    Conjunctiva/sclera: Conjunctivae normal.  Neck:     Musculoskeletal: Normal range of motion and neck supple.     Thyroid: No thyromegaly.  Cardiovascular:     Rate and Rhythm: Normal rate and regular rhythm.     Heart sounds: Normal heart sounds.  Pulmonary:     Effort: Pulmonary effort is normal. No respiratory distress.     Breath sounds: Normal breath sounds.  Lymphadenopathy:     Cervical: No cervical adenopathy.  Skin:    General: Skin is warm and dry.     Findings: No erythema.  Neurological:     Mental Status: She is alert and oriented to person, place, and time.  Psychiatric:        Behavior: Behavior normal.     POC TESTING Office Visit on 09/03/2018  Component Date Value Ref Range Status  . Color, UA 09/03/2018 yellow  yellow Final  . Clarity,  UA 09/03/2018 clear  clear Final  . Glucose, UA 09/03/2018 =100* negative mg/dL Final  . Bilirubin, UA 09/03/2018 negative  negative Final  . Ketones, POC UA 09/03/2018 negative  negative mg/dL Final  . Spec Grav, UA 09/03/2018 1.025  1.010 - 1.025 Final  . Blood, UA 09/03/2018 negative  negative Final  . pH, UA 09/03/2018 5.0  5.0 - 8.0 Final  . Protein Ur, POC 09/03/2018 negative  negative mg/dL Final  . Urobilinogen, UA 09/03/2018 0.2  0.2 or 1.0 E.U./dL Final  . Nitrite, UA 40/98/119112/26/2019 Negative  Negative Final  . Leukocytes, UA 09/03/2018 Small (1+)* Negative Final  . WBC,UR,HPF,POC 09/03/2018 Moderate* None WBC/hpf Final  . RBC,UR,HPF,POC 09/03/2018 None  None RBC/hpf Final  . Bacteria 09/03/2018 Few* None, Too numerous to count Final  . Mucus 09/03/2018 Absent  Absent Final  . Epithelial Cells, UR Per Microscopy 09/03/2018 Few* None, Too numerous to count cells/hpf Final     Assessment & Plan:   1. Urine WBC increased   2. Tremor of face and hands   3. Tremor, essential   4. Tremor due to multiple drugs    Concern abilify has exacerbated tremor so stop and restart primidone which tremor was well-controlled on last yr but was stopped during hosp for CVA. Has f/u with neuro for CVA hx but will also place referral for tremor so she can have that further eval as well - not sure if repeat  MRI of head would be helpful since so sig worsened from prior.  Patient will continue on current chronic medications other than changes noted above, so ok to refill when needed.   See after visit summary for patient specific instructions.  Orders Placed This Encounter  Procedures  . Urine Culture  . TSH  . Ambulatory referral to Neurology    Referral Priority:   Routine    Referral Type:   Consultation    Referral Reason:   Specialty Services Required    Requested Specialty:   Neurology    Number of Visits Requested:   1  . POCT urinalysis dipstick  . POCT Microscopic Urinalysis ([facility])    Pt is being seen to follow-up her CVA but is having a rapidly wornsenig and impairing tremor which she would like full evaluation of asap as well.  Patient verbalized to me that they understand the following: diagnosis, what is being done for them, what to expect and what should be done at home.  Their questions have been answered. They understand that I am unable to predict every possible medication interaction or adverse outcome and that if any unexpected symptoms arise, they should contact us and their pharmacist, as well as never hesitate to seek urgent/emergent care at [facility] or ER if they think it might be warranted.    Norberto SorensonEva Shaw, MD, MPH [facility]  [facility] [address] [place] [address] [place], [place]  [id number] [phone] Office phone  [phone] Office fax  09/03/18 8:47 AM

## 2018-09-03 NOTE — Patient Instructions (Addendum)
We will have to gradually taper up your primidone by 1/2 tab once a week to the prior dose of 250mg  that you were on. I only sent in a 1 mo supply as when you see neurology I though they might change it or send in a different dose/different instructions.  Stop the abilify.  Double up on your atorvastatin 20mg  so you start taking 40mg  daily. When you finish with your current bottle, you can go back to 1 a day on the new prescription that I have sent to your pharmacy for the 40mg  tab  If you have lab work done today you will be contacted with your lab results within the next 2 weeks.  If you have not heard from us then please contact us. The fastest way to get your results is to register for My Chart.   IF you received an x-ray today, you will receive an invoice from [facility]. Please contact [facility] at [phone] with questions or concerns regarding your invoice.   IF you received labwork today, you will receive an invoice from [facility]. Please contact [facility] at [phone] with questions or concerns regarding your invoice.   Our billing staff will not be able to assist you with questions regarding bills from these companies.  You will be contacted with the lab results as soon as they are available. The fastest way to get your results is to activate your My Chart account. Instructions are located on the last page of this paperwork. If you have not heard from us regarding the results in 2 weeks, please contact this office.      Essential Tremor A tremor is trembling or shaking that a person cannot control. Most tremors affect the hands or arms. Tremors can also affect the head, vocal cords, legs, and other parts of the body. Essential tremor is a tremor without a known cause. Usually, it occurs while a person is trying to perform an action. It tends to get worse gradually as a person ages. What are the causes? The cause of this condition is not known. What increases  the risk? You are more likely to develop this condition if:  You have a family member with essential tremor.  You are age 59 or older.  You take certain medicines. What are the signs or symptoms? The main sign of a tremor is a rhythmic shaking of certain parts of your body that is uncontrolled and unintentional. You may:  Have difficulty eating with a spoon or fork.  Have difficulty writing.  Nod your head up and down or side to side.  Have a quivering voice. The shaking may:  Get worse over time.  Come and go.  Be more noticeable on one side of your body.  Get worse due to stress, fatigue, caffeine, and extreme heat or cold. How is this diagnosed? This condition may be diagnosed based on:  Your symptoms and medical history.  A physical exam. There is no single test to diagnose an essential tremor. However, your health care provider may order tests to rule out other causes of your condition. These may include:  Blood and urine tests.  Imaging studies of your brain, such as CT scan and MRI.  A test that measures involuntary muscle movement (electromyogram). How is this treated? Treatment for essential tremor depends on the severity of the condition.  Some tremors may go away without treatment.  Mild tremors may not need treatment if they do not affect your day-to-day life.  Severe tremors may need to be treated using one or more of the following options: ? Medicines. ? Lifestyle changes. ? Occupational or physical therapy. Follow these instructions at home: Lifestyle   Do not use any products that contain nicotine or tobacco, such as cigarettes and e-cigarettes. If you need help quitting, ask your health care provider.  Limit your caffeine intake as told by your health care provider.  Try to get 8 hours of sleep each night.  Find ways to manage your stress that fits your lifestyle and personality. Consider trying meditation or yoga.  Try to anticipate  stressful situations and allow extra time to manage them.  If you are struggling emotionally with the effects of your tremor, consider working with a mental health provider. General instructions  Take over-the-counter and prescription medicines only as told by your health care provider.  Avoid extreme heat and extreme cold.  Keep all follow-up visits as told by your health care provider. This is important. Visits may include physical therapy visits. Contact a health care provider if:  You experience any changes in the location or intensity of your tremors.  You start having a tremor after starting a new medicine.  You have tremor with other symptoms, such as: ? Numbness. ? Tingling. ? Pain. ? Weakness.  Your tremor gets worse.  Your tremor interferes with your daily life.  You feel down, blue, or sad for at least 2 weeks in a row.  Worrying about your tremor and what other people think about you interferes with your everyday life functions, including relationships, work, or school. Summary  Essential tremor is a tremor without a known cause. Usually, it occurs when you are trying to perform an action.  The cause of this condition is not known.  The main sign of a tremor is a rhythmic shaking of certain parts of your body that is uncontrolled and unintentional.  Treatment for essential tremor depends on the severity of the condition. This information is not intended to replace advice given to you by your health care provider. Make sure you discuss any questions you have with your health care provider. Document Released: 09/16/2014 Document Revised: 09/05/2017 Document Reviewed: 09/05/2017 Elsevier Interactive Patient Education  2019 [employer] Inc.    Tremor A tremor is trembling or shaking that you cannot control. Most tremors affect the hands or arms. Tremors can also affect the head, vocal cords, face, and other parts of the body. There are many types of tremors. Common  types include:  Essential tremor. These usually occur in people older than 40. It may run in families and can happen in otherwise healthy people.  Resting tremor. These occur when the muscles are at rest, such as when your hands are resting in your lap. People with Parkinson's disease often have resting tremors.  Postural tremor. These occur when you try to hold a pose, such as keeping your hands outstretched.  Kinetic tremor. These occur during purposeful movement, such as trying to touch a finger to your nose.  Task-specific tremor. These may occur when you perform certain tasks such as writing, speaking, or standing.  Psychogenic tremor. These dramatically lessen or disappear when you are distracted. They can happen in people of all ages. Some types of tremors have no known cause. Tremors can also be a symptom of nervous system problems (neurological disorders) that may occur with aging. Some tremors go away with treatment, while others do not. Follow these instructions at home: Lifestyle  Limit alcohol intake to no more than 1 drink a day for nonpregnant women and 2 drinks a day for men. One drink equals 12 oz of beer, 5 oz of wine, or 1 oz of hard liquor.  Do not use any products that contain nicotine or tobacco, such as cigarettes and e-cigarettes. If you need help quitting, ask your health care provider.  Avoid extreme heat and extreme cold.  Limit your caffeine intake, as told by your health care provider.  Try to get 8 hours of sleep each night.  Find ways to manage your stress, such as meditation or yoga. General instructions  Take over-the-counter and prescription medicines only as told by your health care provider.  Keep all follow-up visits as told by your health care provider. This is important. Contact a health care provider if you:  Develop a tremor after starting a new medicine.  Have a tremor along with other symptoms such  as: ? Numbness. ? Tingling. ? Pain. ? Weakness.  Notice that your tremor gets worse.  Notice that your tremor interferes with your day-to-day life. Summary  A tremor is trembling or shaking that you cannot control.  Most tremors affect the hands or arms.  Some types of tremors have no known cause. Others may be a symptom of nervous system problems (neurological disorders).  Make sure you discuss any tremors you have with your health care provider. This information is not intended to replace advice given to you by your health care provider. Make sure you discuss any questions you have with your health care provider. Document Released: 08/16/2002 Document Revised: 06/26/2017 Document Reviewed: 06/26/2017 Elsevier Interactive Patient Education  2019 [employer].

## 2018-09-03 NOTE — Progress Notes (Signed)
Lab only visit. Pt was scheduled to be seen but before she could be roomed, her father who was accompanying her fell in our lobby and was recommended to go to the ER for further evaluation. She had her fasting labs drawn and was rescheduled.

## 2018-09-03 NOTE — Telephone Encounter (Signed)
Requested medication (s) are due for refill today -yes  Requested medication (s) are on the active medication list -yes  Future visit scheduled -no  Last refill: 08/07/18  Notes to clinic: Patient is requesting a non-delegated medication- sent for provider review   Requested Prescriptions  Pending Prescriptions Disp Refills   ARIPiprazole (ABILIFY) 2 MG tablet [Pharmacy Med Name: ARIPIPRAZOLE 2MG  TABLETS] 30 tablet 0    Sig: TAKE 1 TABLET BY MOUTH DAILY     Not Delegated - Psychiatry:  Antipsychotics - Second Generation (Atypical) - aripiprazole Failed - 09/02/2018  6:07 AM      Failed - This refill cannot be delegated      Failed - Valid encounter within last 6 months    Recent Outpatient Visits          6 days ago Type 2 diabetes mellitus with other neurologic complication, with long-term current use of insulin (HCC)   [facility], Levell JulyEva N, MD   6 months ago Type 2 diabetes mellitus with other neurologic complication, with long-term current use of insulin [facility](HCC)   [facility], Levell JulyEva N, MD   7 months ago 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 [facility](HCC) s/p IV tPA and mechanical intervention   [facility], Levell JulyEva N, MD   8 months ago Middle cerebral artery embolism, left   [facility], Levell JulyEva N, MD   8 months ago Constipation, unspecified constipation type   [facility], [place]      Future Appointments            Today Sherren MochaShaw, [name], MD [facility], [facility]            Requested Prescriptions  Pending Prescriptions Disp Refills   ARIPiprazole (ABILIFY) 2 MG tablet [Pharmacy Med Name: ARIPIPRAZOLE 2MG  TABLETS] 30 tablet 0    Sig: TAKE 1 TABLET BY MOUTH DAILY     Not Delegated - Psychiatry:  Antipsychotics - Second Generation (Atypical) - aripiprazole Failed - 09/02/2018  6:07 AM      Failed - This refill cannot be delegated      Failed - Valid encounter within last 6 months   Recent Outpatient Visits          6 days ago Type 2 diabetes mellitus with other neurologic complication, with long-term current use of insulin [facility](HCC)   [facility], Levell JulyEva N, MD   6 months ago Type 2 diabetes mellitus with other neurologic complication, with long-term current use of insulin [facility](HCC)   [facility], Levell JulyEva N, MD   7 months ago 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(HCC) s/p IV tPA and mechanical intervention   [facility], Levell JulyEva N, MD   8 months ago Middle cerebral artery embolism, left   [facility], Levell JulyEva N, MD   8 months ago Constipation, unspecified constipation type   [facility], [place]      Future Appointments            Today Sherren MochaShaw, [name], MD [facility] [place], [facility]

## 2018-09-04 LAB — URINE CULTURE: ORGANISM ID, BACTERIA: NO GROWTH

## 2018-09-04 LAB — TSH: TSH: 2.24 u[IU]/mL (ref 0.450–4.500)

## 2018-10-05 ENCOUNTER — Ambulatory Visit (INDEPENDENT_AMBULATORY_CARE_PROVIDER_SITE_OTHER): Payer: BLUE CROSS/BLUE SHIELD | Admitting: Endocrinology

## 2018-10-05 ENCOUNTER — Encounter: Payer: Self-pay | Admitting: Endocrinology

## 2018-10-05 ENCOUNTER — Ambulatory Visit (INDEPENDENT_AMBULATORY_CARE_PROVIDER_SITE_OTHER): Payer: BLUE CROSS/BLUE SHIELD

## 2018-10-05 VITALS — BP 118/80 | HR 98 | Ht 64.5 in | Wt 173.2 lb

## 2018-10-05 DIAGNOSIS — Z794 Long term (current) use of insulin: Secondary | ICD-10-CM

## 2018-10-05 DIAGNOSIS — E1149 Type 2 diabetes mellitus with other diabetic neurological complication: Secondary | ICD-10-CM

## 2018-10-05 DIAGNOSIS — I495 Sick sinus syndrome: Secondary | ICD-10-CM | POA: Diagnosis not present

## 2018-10-05 LAB — POCT GLYCOSYLATED HEMOGLOBIN (HGB A1C): HEMOGLOBIN A1C: 7.4 % — AB (ref 4.0–5.6)

## 2018-10-05 MED ORDER — INSULIN DETEMIR 100 UNIT/ML FLEXPEN: 15.0000 [IU] | PEN_INJECTOR | Freq: Every day | SUBCUTANEOUS | Status: DC

## 2018-10-05 MED ORDER — INSULIN ASPART 100 UNIT/ML FLEXPEN: PEN_INJECTOR | SUBCUTANEOUS | 11 refills | Status: DC

## 2018-10-05 NOTE — Patient Instructions (Signed)
check your blood sugar 4 times a day: before the 3 meals, and at bedtime.  also check if you have symptoms of your blood sugar being too high or too low.  please keep a record of the readings and bring it to your next appointment here (or you can bring the meter itself).  You can write it on any piece of paper.  please call us sooner if your blood sugar goes below 70, or if you have a lot of readings over 200.  Please change the insulins to the numbers listed below.  However, if you are going to be active, take just 8 units of the novolog.     Please come back for a follow-up appointment in 3 months.

## 2018-10-05 NOTE — Progress Notes (Signed)
Subjective:    Patient ID: Brittany [name], female    DOB: 1960-10-17, 58 y.o.   MRN: [id number]  HPI Pt returns for f/u of diabetes mellitus: DM type: Insulin-requiring type 2 Dx'ed: 1999 Complications: CVA Therapy: insulin since early 2019 GDM: 1987 DKA: never Severe hypoglycemia: never Pancreatitis: never Pancreatic imaging: normal on 2019 CT Other:  son gives pt her insulin (due to CVA--he says he knows how to use syringe and vial); she gets multiple daily injections.   Interval history: No new sxs. she brings a record of her cbg's which I have reviewed today.  cbg varies from 74-200's.  It is in general lowest at lunch.   Past Medical History:  Diagnosis Date  . Chronic lower back pain   . Depression   . Fibromyalgia   . High cholesterol   . Seizures (HCC) 11/2017   "day after she came home from hospital after having stroke" (01/01/2018)  . Stroke [facility](HCC) 12/06/2017   "speech issues since" (01/01/2018)  . Type II diabetes mellitus (HCC)     Past Surgical History:  Procedure Laterality Date  . BACK SURGERY    . CHOLECYSTECTOMY    . INSERT / REPLACE / REMOVE PACEMAKER  01/01/2018  . IR CT HEAD LTD  12/06/2017  . IR PERCUTANEOUS ART THROMBECTOMY/INFUSION INTRACRANIAL INC DIAG ANGIO  12/06/2017  . LOOP RECORDER INSERTION N/A 12/12/2017   Procedure: LOOP RECORDER INSERTION;  Surgeon: Regan Lemmingamnitz, Will Martin, MD;  Location: [facility] INVASIVE CV LAB;  Service: Cardiovascular;  Laterality: N/A;  . LOOP RECORDER REMOVAL  01/01/2018  . LOOP RECORDER REMOVAL N/A 01/01/2018   Procedure: LOOP RECORDER REMOVAL;  Surgeon: Regan Lemmingamnitz, Will Martin, MD;  Location: [facility] INVASIVE CV LAB;  Service: Cardiovascular;  Laterality: N/A;  . PACEMAKER IMPLANT N/A 01/01/2018   Procedure: PACEMAKER IMPLANT;  Surgeon: Regan Lemmingamnitz, Will Martin, MD;  Location: [facility] INVASIVE CV LAB;  Service: Cardiovascular;  Laterality: N/A;  . RADIOLOGY WITH ANESTHESIA N/A 12/06/2017   Procedure: CODE STROKE;  Surgeon: Julieanne Cottoneveshwar, Sanjeev, MD;   Location: [facility] OR;  Service: Radiology;  Laterality: N/A;  . TEE WITHOUT CARDIOVERSION N/A 12/10/2017   Procedure: TRANSESOPHAGEAL ECHOCARDIOGRAM (TEE);  Surgeon: Jake BatheSkains, Mark C, MD;  [facility];  Service: Cardiovascular;  Laterality: N/A;  . THROMBECTOMY FEMORAL ARTERY Right 12/07/2017   Procedure: THROMBECTOMY RIGHT FEMORAL ARTERY;  Surgeon: Maeola Harmanain, Brandon Christopher, MD;  [facility];  Service: Vascular;  Laterality: Right;  . TONSILLECTOMY      Social History   Socioeconomic History  . Marital status: Married    Spouse name: Brittany [name]  . Number of children: 2  . Years of education: 6414  . Highest education level: Not on file  Occupational History  . Not on file  Social Needs  . Financial resource strain: Not on file  . Food insecurity:    Worry: Not on file    Inability: Not on file  . Transportation needs:    Medical: Not on file    Non-medical: Not on file  Tobacco Use  . Smoking status: Former Smoker    Packs/day: 1.00    Years: 38.00    Pack years: 38.00    Types: Cigarettes    Last attempt to quit: 12/06/2017    Years since quitting: 0.8  . Smokeless tobacco: Never Used  Substance and Sexual Activity  . Alcohol use: Yes    Comment: 01/01/2018 "nothing since stroke 11/2017"  . Drug use: No  . Sexual activity: Not Currently  Lifestyle  .  Physical activity:    Days per week: Not on file    Minutes per session: Not on file  . Stress: Not on file  Relationships  . Social connections:    Talks on phone: Not on file    Gets together: Not on file    Attends religious service: Not on file    Active member of club or organization: Not on file    Attends meetings of clubs or organizations: Not on file    Relationship status: Not on file  . Intimate partner violence:    Fear of current or ex partner: Not on file    Emotionally abused: Not on file    Physically abused: Not on file    Forced sexual activity: Not on file  Other Topics Concern  . Not on file    Social History Narrative   Patient is married ([name]) and lives at home with her family   Patient has two children.   Patient works at [employer].   Patient has a college education.   Patient is right-handed.   Patient drinks 3 cups of coffee M-F.       Current Outpatient Medications on File Prior to Visit  Medication Sig Dispense Refill  . acetaminophen (TYLENOL) 500 MG tablet Take 1,000 mg by mouth every 6 (six) hours as needed for fever (pain).    Marland Kitchen. aspirin EC 81 MG tablet Take 81 mg by mouth daily.    Marland Kitchen. atorvastatin (LIPITOR) 40 MG tablet Take 1 tablet (40 mg total) by mouth daily. 90 tablet 1  . B Complex-Biotin-FA (MULTI-B COMPLEX) CAPS Take one a day ,B complex  multivitamin over the counter or use prenatal vitamin. 90 capsule 0  . clopidogrel (PLAVIX) 75 MG tablet Take 1 tablet (75 mg total) by mouth daily. 90 tablet 3  . irbesartan (AVAPRO) 150 MG tablet TAKE 1 TABLET(150 MG) BY MOUTH DAILY 90 tablet 3  . levETIRAcetam (KEPPRA) 500 MG tablet Take 1 tablet (500 mg total) by mouth 2 (two) times daily. 180 tablet 4  . primidone (MYSOLINE) 50 MG tablet Start 1/2 tab po qhs x 1 wk, then 1 tab po qhs x 1 wk, then 1.5 tabs po qhs x 1 wk, then 2 tabs po qhs x 1 wk 45 tablet 0  . sertraline (ZOLOFT) 100 MG tablet Take 2 tablets (200 mg total) by mouth at bedtime. 180 tablet 3   No current facility-administered medications on file prior to visit.     No Known Allergies  Family History  Problem Relation Age of Onset  . Cancer Mother   . Diabetes Mother   . Heart disease Mother   . Parkinsonism Mother   . Diabetes Father   . Heart disease Father   . Dementia Father   . Cancer Maternal Grandmother 62       lung  . COPD Maternal Aunt     BP 118/80 (BP Location: Right Arm, Patient Position: Sitting, Cuff Size: Normal)   Pulse 98   Ht 5' 4.5" (1.638 m)   Wt 173 lb 3.2 oz (78.6 kg)   SpO2 93%   BMI 29.27 kg/m    Review of Systems She denies hypoglycemia.       Objective:   Physical Exam VITAL SIGNS:  See vs page GENERAL: no distress Pulses: dorsalis pedis intact bilat.   MSK: no deformity of the feet CV: no leg edema Skin:  no ulcer on the feet.  normal color and  temp on the feet. Neuro: sensation is intact to touch on the feet   A1c=7.4%    Assessment & Plan:  Insulin-requiring type 2 DM, with CVA: Based on the pattern of her cbg's, she needs some adjustment in her therapy.    Patient Instructions  check your blood sugar 4 times a day: before the 3 meals, and at bedtime.  also check if you have symptoms of your blood sugar being too high or too low.  please keep a record of the readings and bring it to your next appointment here (or you can bring the meter itself).  You can write it on any piece of paper.  please call us sooner if your blood sugar goes below 70, or if you have a lot of readings over 200.  Please change the insulins to the numbers listed below.  However, if you are going to be active, take just 8 units of the novolog.     Please come back for a follow-up appointment in 3 months.

## 2018-10-06 NOTE — Progress Notes (Signed)
Remote pacemaker transmission.   

## 2018-10-08 LAB — CUP PACEART REMOTE DEVICE CHECK
Battery Remaining Longevity: 176 mo
Battery Voltage: 3.13 V
Brady Statistic AP VP Percent: 1.45 %
Brady Statistic AP VS Percent: 0.3 %
Brady Statistic AS VP Percent: 0.97 %
Brady Statistic RA Percent Paced: 1.95 %
Brady Statistic RV Percent Paced: 2.42 %
Date Time Interrogation Session: 20200127051522
Implantable Lead Implant Date: 20190425
Implantable Lead Implant Date: 20190425
Implantable Lead Location: 753859
Implantable Lead Location: 753860
Implantable Lead Model: 5076
Implantable Lead Model: 5076
Implantable Pulse Generator Implant Date: 20190425
Lead Channel Impedance Value: 342 Ohm
Lead Channel Impedance Value: 456 Ohm
Lead Channel Impedance Value: 532 Ohm
Lead Channel Impedance Value: 551 Ohm
Lead Channel Pacing Threshold Amplitude: 1 V
Lead Channel Pacing Threshold Pulse Width: 0.4 ms
Lead Channel Pacing Threshold Pulse Width: 0.4 ms
Lead Channel Sensing Intrinsic Amplitude: 0.75 mV
Lead Channel Sensing Intrinsic Amplitude: 19.5 mV
Lead Channel Sensing Intrinsic Amplitude: 19.5 mV
Lead Channel Setting Pacing Amplitude: 2 V
Lead Channel Setting Pacing Pulse Width: 0.4 ms
Lead Channel Setting Sensing Sensitivity: 2 mV
MDC IDC MSMT LEADCHNL RA SENSING INTR AMPL: 0.75 mV
MDC IDC MSMT LEADCHNL RV PACING THRESHOLD AMPLITUDE: 0.5 V
MDC IDC SET LEADCHNL RV PACING AMPLITUDE: 2.5 V
MDC IDC STAT BRADY AS VS PERCENT: 97.27 %

## 2018-10-16 ENCOUNTER — Ambulatory Visit: Payer: BLUE CROSS/BLUE SHIELD | Admitting: Adult Health

## 2018-10-18 ENCOUNTER — Other Ambulatory Visit: Payer: Self-pay | Admitting: Family Medicine

## 2018-10-19 NOTE — Telephone Encounter (Signed)
pls see med request 

## 2018-10-19 NOTE — Telephone Encounter (Signed)
Please review for tapering of primidone 50 mg tab. Provider  Dr. Clelia Croft LOV  09/03/18

## 2018-10-20 NOTE — Telephone Encounter (Signed)
Refilled to continue taper up to 250mg  qhs which was prior dose.

## 2018-11-02 ENCOUNTER — Encounter: Payer: Self-pay | Admitting: Neurology

## 2018-11-02 ENCOUNTER — Ambulatory Visit (INDEPENDENT_AMBULATORY_CARE_PROVIDER_SITE_OTHER): Payer: BLUE CROSS/BLUE SHIELD | Admitting: Neurology

## 2018-11-02 VITALS — BP 129/73 | HR 89 | Ht 64.5 in | Wt 179.0 lb

## 2018-11-02 DIAGNOSIS — I672 Cerebral atherosclerosis: Secondary | ICD-10-CM

## 2018-11-02 DIAGNOSIS — G250 Essential tremor: Secondary | ICD-10-CM | POA: Diagnosis not present

## 2018-11-02 DIAGNOSIS — I669 Occlusion and stenosis of unspecified cerebral artery: Secondary | ICD-10-CM

## 2018-11-02 MED ORDER — PRIMIDONE 250 MG PO TABS: 250.0000 mg | ORAL_TABLET | Freq: Two times a day (BID) | ORAL | 3 refills | Status: DC

## 2018-11-02 NOTE — Progress Notes (Signed)
[facility] [address] [place]. [place] [address] [phone]       OFFICE FOLLOW-UP NOTE  [name] Date of Birth:  21-Jul-1961 Medical Record Number:  [id number]   Chief Complaint  Patient presents with  . Referral    Referral tremor for hands and face room 2 pt with son in law Brittany [name] pt had tremors before stroke     HPI: Brittany [name] is a 58 year old Caucasian lady seen today for first office follow-up visit following hospital admission for stroke in April 2019. She is accompanied by her son-in-law. History is obtained from them and review of electronic medical records. I have personally reviewed imaging films. Brittany [name] Sowersis an 58 y.o.femalewith a PMH of HTN, HLD, DM, Hx of prior CVA and tobacco abuse who presents to the ED as a code stroke with reports of acute onset Global Aphasia, last seen normal at approximately 09:15 this morning 12/06/2017. CT Head code stroke reveals Acute Left Insula Infarct with suspected embolus in distal Left M2. No contraindications were identified in patients history and IV tPA was administered. IR was notified and patient family was briefed by Dr Laurence Slate. Risks and benefits of IR procedure was explained and family wants to proceed. NIH Stroke Scale: 10. Modified Rankin:Score=1. Patient was administered IV TPA and   She subsequently underwent mechanical thrombectomy of left M2 occlusion with TICI 2b reperfusion.gastritis sig echo was unremarkable. Transesophageal echo showed no Source of embolism. LDL cholesterol 81 mg percent. Hemoglobin A1c was 11.5. She was on aspirin prior to admission which was changed to Plavix. Patient did well and was subsequently discharged home with home physical occupational speech therapy. Patient subsequently had symptomatic bradycardia and was seen by Dr. Elberta Fortis electrophysiologist and underwent pacemaker insertion with removal of the loop recorder on 01/02/18. She has not yet found A. Fib. The  patient's Plavix and Lipitor have been inadvertently discontinued following the admission for pacemaker. She states her sugars under good control. She is still on Keppra which was started when she went back on 12/13/17 to the ER with a witnessed seizure. She's had no further breakthrough seizures. She still has residual aphasia but it is improving. Her home speech therapy is recommending outpatient speech therapy now. Physical and occupational therapy have discharged her. She is still out of work and wants FMLA paperwork filled out.  Update 04/15/2018: Patient is being seen today for follow-up and is accompanied by her two sons.  She continues to have expressive aphasia > receptive aphasia but she continues to participate in speech therapy at neuro rehab clinic.  She states some days are better than others but overall has been improving.  She does notice increased difficulty with expressive aphasia with increased stress or fatigue.  She also has been having difficulty with processing information.  She continues to take aspirin and Plavix without side effects of bleeding or bruising.  Continues to take Lipitor without side effects myalgias.  Blood pressure today satisfactory 125/70.  Patient does not monitor this at home and this was highly recommended.  She does monitor glucose levels at home which have been fluctuating but she does have an appointment scheduled on 05/04/2018 for her first visit with endocrinologist.  She continues to take Keppra 500 mg twice daily without recent seizure activity.  Patient has not returned to work at this time working at [employer] due to continued global aphasia.  Patient deals greatly with the public and on the phone with  collections department and patient continues to have difficulty with expressive aphasia especially under stressful situations.  She is currently living at home with her husband, son and father who has Alzheimer's.  Patient states she was told by  her cardiologist that she can start driving at this time but educated patient on  law stating that she is unable to drive for 6 months after seizure activity.  Patient verbalized understanding.  Patient has been going through stressful situation at home with her son, daughter-in-law and her children as far as divorce and custody battle.  Since this is been occurring, patient has had increase in fluctuation with her expressive aphasia.  Her receptive aphasia continues to improve but does misunderstand words at times and continues to have issues with processing information.  Denies new or worsening stroke/TIA symptoms. Update 11/02/2018 : She returns for follow-up after last visit 6 months ago.  She is accompanied by her son-in-law.  Patient has noticed worsening of her hand and voice tremor since she was taken off the primidone following her stroke.  The tremors are present at rest and do increase with action and do interfere with her day-to-day activities.  She was started recently back on primidone 2 weeks ago by primary physician and took 125 mg for 2 weeks.  She increase it 250 mg for couple of days but ran out of it and has not been taking the medicine for the last for 5 days.  She has not had any recurrent stroke or TIA symptoms.  She remains on aspirin and Plavix and tolerating it well without bleeding or bruising.  Her blood pressure is well controlled today it is 129/73.  She states her sugars work controlled and the last A1c was below 7.  She is tolerating Lipitor well without muscle aches and pains and last lipid profile checked in December last year was satisfactory.  She continues to have mild residual aphasia from the stroke but is able to communicate if she speaks slowly and deliberately.  ROS:   14 system review of systems is positive for weight gain, wheezing, hearing loss, easy bruising, tremor, snoring and all other systems negative PMH:  Past Medical History:  Diagnosis Date  . Chronic  lower back pain   . Depression   . Fibromyalgia   . High cholesterol   . Seizures (HCC) 11/2017   "day after she came home from hospital after having stroke" (01/01/2018)  . Stroke [facility]) 12/06/2017   "speech issues since" (01/01/2018)  . Type II diabetes mellitus (HCC)     Social History:  Social History   Socioeconomic History  . Marital status: Married    Spouse name: [name]  . Number of children: 2  . Years of education: 21  . Highest education level: Not on file  Occupational History  . Not on file  Social Needs  . Financial resource strain: Not on file  . Food insecurity:    Worry: Not on file    Inability: Not on file  . Transportation needs:    Medical: Not on file    Non-medical: Not on file  Tobacco Use  . Smoking status: Former Smoker    Packs/day: 1.00    Years: 38.00    Pack years: 38.00    Types: Cigarettes    Last attempt to quit: 12/06/2017    Years since quitting: 0.9  . Smokeless tobacco: Never Used  Substance and Sexual Activity  . Alcohol use: Yes    Comment: 01/01/2018 "  nothing since stroke 11/2017"  . Drug use: No  . Sexual activity: Not Currently  Lifestyle  . Physical activity:    Days per week: Not on file    Minutes per session: Not on file  . Stress: Not on file  Relationships  . Social connections:    Talks on phone: Not on file    Gets together: Not on file    Attends religious service: Not on file    Active member of club or organization: Not on file    Attends meetings of clubs or organizations: Not on file    Relationship status: Not on file  . Intimate partner violence:    Fear of current or ex partner: Not on file    Emotionally abused: Not on file    Physically abused: Not on file    Forced sexual activity: Not on file  Other Topics Concern  . Not on file  Social History Narrative   Patient is married ([name]) and lives at home with her family   Patient has two children.   Patient works at [employer].   Patient has  a college education.   Patient is right-handed.   Patient drinks 3 cups of coffee M-F.       Medications:   Current Outpatient Medications on File Prior to Visit  Medication Sig Dispense Refill  . acetaminophen (TYLENOL) 500 MG tablet Take 1,000 mg by mouth every 6 (six) hours as needed for fever (pain).    Marland Kitchen atorvastatin (LIPITOR) 40 MG tablet Take 1 tablet (40 mg total) by mouth daily. 90 tablet 1  . B Complex-Biotin-FA (MULTI-B COMPLEX) CAPS Take one a day ,B complex  multivitamin over the counter or use prenatal vitamin. 90 capsule 0  . clopidogrel (PLAVIX) 75 MG tablet Take 1 tablet (75 mg total) by mouth daily. 90 tablet 3  . insulin aspart (NOVOLOG FLEXPEN) 100 UNIT/ML FlexPen 3 times a day (just before each meal), 07-23-13, and pen needles 4/day 15 mL 11  . Insulin Detemir (LEVEMIR FLEXTOUCH) 100 UNIT/ML Pen Inject 15 Units into the skin at bedtime.    . irbesartan (AVAPRO) 150 MG tablet TAKE 1 TABLET(150 MG) BY MOUTH DAILY 90 tablet 3  . levETIRAcetam (KEPPRA) 500 MG tablet Take 1 tablet (500 mg total) by mouth 2 (two) times daily. 180 tablet 4  . sertraline (ZOLOFT) 100 MG tablet Take 2 tablets (200 mg total) by mouth at bedtime. 180 tablet 3   No current facility-administered medications on file prior to visit.     Allergies:  No Known Allergies  Physical Exam General:frail middle-aged lady seated, in no evident distress Head: head normocephalic and atraumatic.  Neck: supple with no carotid or supraclavicular bruits Cardiovascular: regular rate and rhythm, no murmurs Musculoskeletal: no deformity Skin:  no rash/petichiae Vascular:  Normal pulses all extremities Vitals:   11/02/18 0855  BP: 129/73  Pulse: 89   Neurologic Exam Mental Status: Awake and fully alert.  Mild to moderate expressive aphasia > receptive aphasia.  Oriented to place and time. Recent and remote memory intact. Attention span, concentration and fund of knowledge appropriate. Mood and affect  appropriate. Slightly impaired comprehension. No dysarthria Cranial Nerves: Fundoscopic exam reveals sharp disc margins. Pupils equal, briskly reactive to light. Extraocular movements full without nystagmus. Visual fields full to confrontation. Hearing intact. Facial sensation intact. Mild right nasolabial fold asymmetry., tongue, palate moves normally and symmetrically.  Motor: Normal bulk and tone. Normal strength in all tested extremity  muscles.  Mild tremors involving bilateral upper extremities as well as slight tremors involving voice and head.  No cogwheel rigidity or bradykinesia.  Able to get up from the chair with out difficulty.  Sensory.: intact to touch ,pinprick .position and vibratory sensation.  Coordination: Rapid alternating movements normal in all extremities. Finger-to-nose and heel-to-shin performed accurately bilaterally. Gait and Station: Arises from chair without difficulty. Stance is normal. Gait demonstrates normal stride length and balance . Able to heel, toe and tandem walk with   Moderate difficulty.  Reflexes: 1+ and symmetric. Toes downgoing.     ASSESSMENT: [age] year old lady with aphasia secondary to left MCA infarct in April 2019 of cryptogenic etiology treated with IV TPA followed by mechanical thrombectomy with good recanalization. She also had symptomatic seizure after discharge and still has residual aphasia.  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  PLAN: I had a long discussion with the patient and her son-in-law regarding her head, voice and hand tremors which clearly appear to have been exacerbated after stopping Mysoline.  I recommend she continue 250 mg at night for 2 weeks and if tolerated increase further to twice daily.  I have discussed possible side effects with the patient and asked her to call me if needed.  She will continue Keppra and the current dose for seizure prevention.  Continue  Plavix for stroke prevention but discontinue aspirin as I do not see any added benefit of long-term dual antiplatelet therapy.  Continue strict control of hypertension with blood pressure goal below 130/90, lipids with LDL cholesterol goal below 70 mg percent and diabetes with hemoglobin A1c goal below 7.  She was advised to eat a healthy diet with lots of fruits, vegetables, cereals and whole grains and to be active and exercise 30 minutes every day.  Check screening follow-up carotid ultrasound and transcranial Doppler studies.Greater than 50% of time during this 25 minute visit was spent on counseling,explanation of diagnosis, planning of further management, discussion with patient and family and coordination of care    She will return for follow-up in 3 months with my nurse practitioner Brittany [name] or call earlier if necessary    Delia Heady, MD [facility] [address] [place], [place] [address]  Phone [phone] Fax [phone] Note: This document was prepared with digital dictation and possible smart phrase technology. Any transcriptional errors that result from this process are unintentional.

## 2018-11-02 NOTE — Patient Instructions (Signed)
I had a long discussion with the patient and her son-in-law regarding her head, voice and hand tremors which clearly appear to have been exacerbated after stopping Mysoline.  I recommend she continue to 50 mg at night for 2 weeks and if tolerated increase further to twice daily.  I have discussed possible side effects with the patient and asked her to call me if needed.  Continue Plavix for stroke prevention but discontinue aspirin as I do not see any added benefit of long-term dual antiplatelet therapy.  Continue strict control of hypertension with blood pressure goal below 130/90, lipids with LDL cholesterol goal below 70 mg percent and diabetes with hemoglobin A1c goal below 7.  She was advised to eat a healthy diet with lots of fruits, vegetables, cereals and whole grains and to be active and exercise 30 minutes every day.  Check screening follow-up carotid ultrasound and transcranial Doppler studies.  She will return for follow-up in 3 months with my nurse practitioner Shanda Bumps or call earlier if necessary

## 2018-11-12 ENCOUNTER — Ambulatory Visit (HOSPITAL_BASED_OUTPATIENT_CLINIC_OR_DEPARTMENT_OTHER)
Admission: RE | Admit: 2018-11-12 | Discharge: 2018-11-12 | Disposition: A | Discharge status: Home / Self Care | Payer: BLUE CROSS/BLUE SHIELD | Source: Ambulatory Visit | Attending: Neurology | Admitting: Neurology

## 2018-11-12 ENCOUNTER — Ambulatory Visit (HOSPITAL_COMMUNITY)
Admission: RE | Admit: 2018-11-12 | Discharge: 2018-11-12 | Disposition: A | Discharge status: Home / Self Care | Payer: BLUE CROSS/BLUE SHIELD | Source: Ambulatory Visit | Attending: Neurology | Admitting: Neurology

## 2018-11-12 DIAGNOSIS — I672 Cerebral atherosclerosis: Secondary | ICD-10-CM | POA: Diagnosis not present

## 2018-11-12 DIAGNOSIS — I669 Occlusion and stenosis of unspecified cerebral artery: Secondary | ICD-10-CM

## 2018-11-23 ENCOUNTER — Telehealth: Payer: Self-pay

## 2018-11-23 NOTE — Telephone Encounter (Signed)
-----   Message from Micki Riley, MD sent at 11/22/2018 10:47 AM EDT ----- Joneen Roach inform the patient that transcranial Doppler study was suboptimal due to poor bony windows. None identified blood vessels showed normal flow without significant narrowing. No worrisome finding.

## 2018-11-23 NOTE — Telephone Encounter (Signed)
I called pts husband on dpr. I stated the TCD doppler had blood vessels that showed normal flow without narrowing. No worrisome findings. The husband verbalized understanding. ------

## 2018-11-23 NOTE — Telephone Encounter (Signed)
I called patients husband that the VAS carotid showed no significant blockages in the major blood vessels. The husband verbalized understanding. ------

## 2019-01-04 ENCOUNTER — Encounter: Payer: Self-pay | Admitting: Endocrinology

## 2019-01-04 ENCOUNTER — Ambulatory Visit (INDEPENDENT_AMBULATORY_CARE_PROVIDER_SITE_OTHER): Payer: BLUE CROSS/BLUE SHIELD | Admitting: *Deleted

## 2019-01-04 ENCOUNTER — Other Ambulatory Visit: Payer: Self-pay

## 2019-01-04 ENCOUNTER — Ambulatory Visit (INDEPENDENT_AMBULATORY_CARE_PROVIDER_SITE_OTHER): Payer: BLUE CROSS/BLUE SHIELD | Admitting: Endocrinology

## 2019-01-04 DIAGNOSIS — Z794 Long term (current) use of insulin: Secondary | ICD-10-CM | POA: Diagnosis not present

## 2019-01-04 DIAGNOSIS — E1149 Type 2 diabetes mellitus with other diabetic neurological complication: Secondary | ICD-10-CM | POA: Diagnosis not present

## 2019-01-04 DIAGNOSIS — I442 Atrioventricular block, complete: Secondary | ICD-10-CM

## 2019-01-04 DIAGNOSIS — I495 Sick sinus syndrome: Secondary | ICD-10-CM

## 2019-01-04 LAB — CUP PACEART REMOTE DEVICE CHECK
Battery Remaining Longevity: 173 mo
Battery Voltage: 3.09 V
Brady Statistic AP VP Percent: 1.41 %
Brady Statistic AP VS Percent: 0.65 %
Brady Statistic AS VP Percent: 1.53 %
Brady Statistic AS VS Percent: 96.41 %
Brady Statistic RA Percent Paced: 2.08 %
Brady Statistic RV Percent Paced: 2.94 %
Date Time Interrogation Session: 20200427042422
Implantable Lead Implant Date: 20190425
Implantable Lead Implant Date: 20190425
Implantable Lead Location: 753859
Implantable Lead Location: 753860
Implantable Lead Model: 5076
Implantable Lead Model: 5076
Implantable Pulse Generator Implant Date: 20190425
Lead Channel Impedance Value: 361 Ohm
Lead Channel Impedance Value: 437 Ohm
Lead Channel Impedance Value: 513 Ohm
Lead Channel Impedance Value: 551 Ohm
Lead Channel Pacing Threshold Amplitude: 0.5 V
Lead Channel Pacing Threshold Amplitude: 0.875 V
Lead Channel Pacing Threshold Pulse Width: 0.4 ms
Lead Channel Pacing Threshold Pulse Width: 0.4 ms
Lead Channel Sensing Intrinsic Amplitude: 1.75 mV
Lead Channel Sensing Intrinsic Amplitude: 1.75 mV
Lead Channel Sensing Intrinsic Amplitude: 19.5 mV
Lead Channel Sensing Intrinsic Amplitude: 19.5 mV
Lead Channel Setting Pacing Amplitude: 1.75 V
Lead Channel Setting Pacing Amplitude: 2.5 V
Lead Channel Setting Pacing Pulse Width: 0.4 ms
Lead Channel Setting Sensing Sensitivity: 2 mV

## 2019-01-04 MED ORDER — INSULIN DETEMIR 100 UNIT/ML FLEXPEN: 17.0000 [IU] | PEN_INJECTOR | Freq: Every day | SUBCUTANEOUS | Status: DC

## 2019-01-04 NOTE — Progress Notes (Signed)
Subjective:    Patient ID: Brittany [name], female    DOB: 19-Apr-1961, 58 y.o.   MRN: [id number]  HPI  telehealth visit today via doxy video visit.  Alternatives to telehealth are presented to this patient, and the patient agrees to the telehealth visit. Pt is advised of the cost of the visit, and agrees to this, also.   Patient is at home, and I am at the office.   Persons attending the telehealth visit: the patient and I.  Pt returns for f/u of diabetes mellitus: DM type: Insulin-requiring type 2 Dx'ed: 1999 Complications: CVA Therapy: insulin since early 2019 GDM: 1987 DKA: never Severe hypoglycemia: never Pancreatitis: never Pancreatic imaging: normal on 2019 CT.   Other: son gives pt her insulin (due to CVA--he says he knows how to use syringe and vial); she gets multiple daily injections.   Interval history: she is making slow progress since CVA. she brings a record of her cbg's which I have reviewed today.  cbg varies from 91-220.  It is in general lowest in the afternoon, and highest fasting.  She never misses the insulin.  Her activity is less, due to quarantine.   Past Medical History:  Diagnosis Date  . Chronic lower back pain   . Depression   . Fibromyalgia   . High cholesterol   . Seizures (HCC) 11/2017   "day after she came home from hospital after having stroke" (01/01/2018)  . Stroke [facility]) 12/06/2017   "speech issues since" (01/01/2018)  . Type II diabetes mellitus (HCC)     Past Surgical History:  Procedure Laterality Date  . BACK SURGERY    . CHOLECYSTECTOMY    . INSERT / REPLACE / REMOVE PACEMAKER  01/01/2018  . IR CT HEAD LTD  12/06/2017  . IR PERCUTANEOUS ART THROMBECTOMY/INFUSION INTRACRANIAL INC DIAG ANGIO  12/06/2017  . LOOP RECORDER INSERTION N/A 12/12/2017   Procedure: LOOP RECORDER INSERTION;  Surgeon: Regan Lemming, MD;  Location: [facility] INVASIVE CV LAB;  Service: Cardiovascular;  Laterality: N/A;  . LOOP RECORDER REMOVAL  01/01/2018  . LOOP  RECORDER REMOVAL N/A 01/01/2018   Procedure: LOOP RECORDER REMOVAL;  Surgeon: Regan Lemming, MD;  Location: [facility] INVASIVE CV LAB;  Service: Cardiovascular;  Laterality: N/A;  . PACEMAKER IMPLANT N/A 01/01/2018   Procedure: PACEMAKER IMPLANT;  Surgeon: Regan Lemming, MD;  Location: [facility] INVASIVE CV LAB;  Service: Cardiovascular;  Laterality: N/A;  . RADIOLOGY WITH ANESTHESIA N/A 12/06/2017   Procedure: CODE STROKE;  Surgeon: Julieanne Cotton, MD;  Location: [facility] OR;  Service: Radiology;  Laterality: N/A;  . TEE WITHOUT CARDIOVERSION N/A 12/10/2017   Procedure: TRANSESOPHAGEAL ECHOCARDIOGRAM (TEE);  Surgeon: Jake Bathe, MD;  Location: [facility] ENDOSCOPY;  Service: Cardiovascular;  Laterality: N/A;  . THROMBECTOMY FEMORAL ARTERY Right 12/07/2017   Procedure: THROMBECTOMY RIGHT FEMORAL ARTERY;  Surgeon: Maeola Harman, MD;  Location: [facility] OR;  Service: Vascular;  Laterality: Right;  . TONSILLECTOMY      Social History   Socioeconomic History  . Marital status: Married    Spouse name: [name]  . Number of children: 2  . Years of education: 47  . Highest education level: Not on file  Occupational History  . Not on file  Social Needs  . Financial resource strain: Not on file  . Food insecurity:    Worry: Not on file    Inability: Not on file  . Transportation needs:    Medical: Not on file    Non-medical:  Not on file  Tobacco Use  . Smoking status: Former Smoker    Packs/day: 1.00    Years: 38.00    Pack years: 38.00    Types: Cigarettes    Last attempt to quit: 12/06/2017    Years since quitting: 1.0  . Smokeless tobacco: Never Used  Substance and Sexual Activity  . Alcohol use: Yes    Comment: 01/01/2018 "nothing since stroke 11/2017"  . Drug use: No  . Sexual activity: Not Currently  Lifestyle  . Physical activity:    Days per week: Not on file    Minutes per session: Not on file  . Stress: Not on file  Relationships  . Social connections:    Talks on phone: Not  on file    Gets together: Not on file    Attends religious service: Not on file    Active member of club or organization: Not on file    Attends meetings of clubs or organizations: Not on file    Relationship status: Not on file  . Intimate partner violence:    Fear of current or ex partner: Not on file    Emotionally abused: Not on file    Physically abused: Not on file    Forced sexual activity: Not on file  Other Topics Concern  . Not on file  Social History Narrative   Patient is married ([name]) and lives at home with her family   Patient has two children.   Patient works at [employer].   Patient has a college education.   Patient is right-handed.   Patient drinks 3 cups of coffee M-F.       Current Outpatient Medications on File Prior to Visit  Medication Sig Dispense Refill  . acetaminophen (TYLENOL) 500 MG tablet Take 1,000 mg by mouth every 6 (six) hours as needed for fever (pain).    Marland Kitchen. atorvastatin (LIPITOR) 40 MG tablet Take 1 tablet (40 mg total) by mouth daily. 90 tablet 1  . B Complex-Biotin-FA (MULTI-B COMPLEX) CAPS Take one a day ,B complex  multivitamin over the counter or use prenatal vitamin. 90 capsule 0  . clopidogrel (PLAVIX) 75 MG tablet Take 1 tablet (75 mg total) by mouth daily. 90 tablet 3  . insulin aspart (NOVOLOG FLEXPEN) 100 UNIT/ML FlexPen 3 times a day (just before each meal), 07-23-13, and pen needles 4/day 15 mL 11  . irbesartan (AVAPRO) 150 MG tablet TAKE 1 TABLET(150 MG) BY MOUTH DAILY 90 tablet 3  . levETIRAcetam (KEPPRA) 500 MG tablet Take 1 tablet (500 mg total) by mouth 2 (two) times daily. 180 tablet 4  . primidone (MYSOLINE) 250 MG tablet Take 1 tablet (250 mg total) by mouth 2 (two) times daily. Start 1 tablet at bedtime for 2 weeks and increase if tolerated to 1 tablet twice daily 60 tablet 3  . sertraline (ZOLOFT) 100 MG tablet Take 2 tablets (200 mg total) by mouth at bedtime. 180 tablet 3   No current facility-administered  medications on file prior to visit.     No Known Allergies  Family History  Problem Relation Age of Onset  . Cancer Mother   . Diabetes Mother   . Heart disease Mother   . Parkinsonism Mother   . Diabetes Father   . Heart disease Father   . Dementia Father   . Cancer Maternal Grandmother 62       lung  . COPD Maternal Aunt      Review of  Systems She denies hypoglycemia.  She reports weight gain.      Objective:   Physical Exam      Assessment & Plan:  Insulin-requiring type 2 DM, with CVA: we discussed.  She declines to have a1c done now.  Obesity., worse.  This is increasing insulin need.   CVA: in this setting, she is not a candidate for aggressive glycemic control.   Patient Instructions  check your blood sugar 4 times a day: before the 3 meals, and at bedtime.  also check if you have symptoms of your blood sugar being too high or too low.  please keep a record of the readings and bring it to your next appointment here (or you can bring the meter itself).  You can write it on any piece of paper.  please call us sooner if your blood sugar goes below 70, or if you have a lot of readings over 200.  Please increase the Levemir to 17 units at bedtime.   Please come back for a follow-up appointment in 2 months.

## 2019-01-04 NOTE — Patient Instructions (Addendum)
check your blood sugar 4 times a day: before the 3 meals, and at bedtime.  also check if you have symptoms of your blood sugar being too high or too low.  please keep a record of the readings and bring it to your next appointment here (or you can bring the meter itself).  You can write it on any piece of paper.  please call us sooner if your blood sugar goes below 70, or if you have a lot of readings over 200.  Please increase the Levemir to 17 units at bedtime.   Please come back for a follow-up appointment in 2 months.

## 2019-01-13 ENCOUNTER — Telehealth: Payer: Self-pay | Admitting: Cardiology

## 2019-01-13 NOTE — Telephone Encounter (Signed)
New message    [name] scheduled visit with Dr. Elberta Fortisamnitz for 05.07.20. Pt smart phone number is listed in appt notes.      Virtual Visit Pre-Appointment Phone Call  "(Name), I am calling you today to discuss your upcoming appointment. We are currently trying to limit exposure to the virus that causes COVID-19 by seeing patients at home rather than in the office."  1. "What is the BEST phone number to call the day of the visit?" - include this in appointment notes  2. Do you have or have access to (through a family member/friend) a smartphone with video capability that we can use for your visit?" a. If yes - list this number in appt notes as cell (if different from BEST phone #) and list the appointment type as a VIDEO visit in appointment notes b. If no - list the appointment type as a PHONE visit in appointment notes  3. Confirm consent - "In the setting of the current Covid19 crisis, you are scheduled for a (phone or video) visit with your provider on (date) at (time).  Just as we do with many in-office visits, in order for you to participate in this visit, we must obtain consent.  If you'd like, I can send this to your mychart (if signed up) or email for you to review.  Otherwise, I can obtain your verbal consent now.  All virtual visits are billed to your insurance company just like a normal visit would be.  By agreeing to a virtual visit, we'd like you to understand that the technology does not allow for your provider to perform an examination, and thus may limit your provider's ability to fully assess your condition. If your provider identifies any concerns that need to be evaluated in person, we will make arrangements to do so.  Finally, though the technology is pretty good, we cannot assure that it will always work on either your or our end, and in the setting of a video visit, we may have to convert it to a phone-only visit.  In either situation, we cannot ensure that we have a  secure connection.  Are you willing to proceed?" STAFF: Did the patient verbally acknowledge consent to telehealth visit? Document YES/NO here: YES  4. Advise patient to be prepared - "Two hours prior to your appointment, go ahead and check your blood pressure, pulse, oxygen saturation, and your weight (if you have the equipment to check those) and write them all down. When your visit starts, your provider will ask you for this information. If you have an Apple Watch or Kardia device, please plan to have heart rate information ready on the day of your appointment. Please have a pen and paper handy nearby the day of the visit as well."  5. Give patient instructions for MyChart download to smartphone OR Doximity/Doxy.me as below if video visit (depending on what platform provider is using)  6. Inform patient they will receive a phone call 15 minutes prior to their appointment time (may be from unknown caller ID) so they should be prepared to answer    TELEPHONE CALL NOTE  Brittany [name] Renaud has been deemed a candidate for a follow-up tele-health visit to limit community exposure during the Covid-19 pandemic. I spoke with the patient via phone to ensure availability of phone/video source, confirm preferred email & phone number, and discuss instructions and expectations.  I reminded Brittany [name] Duquette to be prepared with any vital sign and/or heart rhythm  information that could potentially be obtained via home monitoring, at the time of her visit. I reminded Brittany [name] to expect a phone call prior to her visit.  Brittany [name] 01/13/2019 2:12 PM   INSTRUCTIONS FOR DOWNLOADING THE MYCHART APP TO SMARTPHONE  - The patient must first make sure to have activated MyChart and know their login information - If Apple, go to [employer] and type in MyChart in the search bar and download the app. If Android, ask patient to go to [employer] and type in [place] in the search bar and download the app. The  app is free but as with any other app downloads, their phone may require them to verify saved payment information or Apple/Android password.  - The patient will need to then log into the app with their MyChart username and password, and select [facility] as their healthcare provider to link the account. When it is time for your visit, go to the MyChart app, find appointments, and click Begin Video Visit. Be sure to Select Allow for your device to access the Microphone and Camera for your visit. You will then be connected, and your provider will be with you shortly.  **If they have any issues connecting, or need assistance please contact MyChart service desk [phone])**  **If using a computer, in order to ensure the best quality for their visit they will need to use either of the following Internet Browsers: [employer] or Google Chrome**  IF USING DOXIMITY or DOXY.ME - The patient will receive a link just prior to their visit by text.     FULL LENGTH CONSENT FOR TELE-HEALTH VISIT   I hereby voluntarily request, consent and authorize [facility] and its employed or contracted physicians, physician assistants, nurse practitioners or other licensed health care professionals (the Practitioner), to provide me with telemedicine health care services (the Services") as deemed necessary by the treating Practitioner. I acknowledge and consent to receive the Services by the Practitioner via telemedicine. I understand that the telemedicine visit will involve communicating with the Practitioner through live audiovisual communication technology and the disclosure of certain medical information by electronic transmission. I acknowledge that I have been given the opportunity to request an in-person assessment or other available alternative prior to the telemedicine visit and am voluntarily participating in the telemedicine visit.  I understand that I have the right to withhold or withdraw  my consent to the use of telemedicine in the course of my care at any time, without affecting my right to future care or treatment, and that the Practitioner or I may terminate the telemedicine visit at any time. I understand that I have the right to inspect all information obtained and/or recorded in the course of the telemedicine visit and may receive copies of available information for a reasonable fee.  I understand that some of the potential risks of receiving the Services via telemedicine include:   Delay or interruption in medical evaluation due to technological equipment failure or disruption;  Information transmitted may not be sufficient (e.g. poor resolution of images) to allow for appropriate medical decision making by the Practitioner; and/or   In rare instances, security protocols could fail, causing a breach of personal health information.  Furthermore, I acknowledge that it is my responsibility to provide information about my medical history, conditions and care that is complete and accurate to the best of my ability. I acknowledge that Practitioner's advice, recommendations, and/or decision may be based on factors  not within their control, such as incomplete or inaccurate data provided by me or distortions of diagnostic images or specimens that may result from electronic transmissions. I understand that the practice of medicine is not an exact science and that Practitioner makes no warranties or guarantees regarding treatment outcomes. I acknowledge that I will receive a copy of this consent concurrently upon execution via email to the email address I last provided but may also request a printed copy by calling the office of [place].    I understand that my insurance will be billed for this visit.   I have read or had this consent read to me.  I understand the contents of this consent, which adequately explains the benefits and risks of the Services being provided via  telemedicine.   I have been provided ample opportunity to ask questions regarding this consent and the Services and have had my questions answered to my satisfaction.  I give my informed consent for the services to be provided through the use of telemedicine in my medical care  By participating in this telemedicine visit I agree to the above.

## 2019-01-13 NOTE — Progress Notes (Signed)
Remote pacemaker transmission.   

## 2019-01-14 ENCOUNTER — Other Ambulatory Visit: Payer: Self-pay

## 2019-01-14 ENCOUNTER — Encounter: Payer: Self-pay | Admitting: Cardiology

## 2019-01-14 ENCOUNTER — Telehealth (INDEPENDENT_AMBULATORY_CARE_PROVIDER_SITE_OTHER): Payer: BLUE CROSS/BLUE SHIELD | Admitting: Cardiology

## 2019-01-14 DIAGNOSIS — R001 Bradycardia, unspecified: Secondary | ICD-10-CM

## 2019-01-14 NOTE — Progress Notes (Signed)
Electrophysiology TeleHealth Note   Due to national recommendations of social distancing due to COVID 19, an audio/video telehealth visit is felt to be most appropriate for this patient at this time.  See Epic message for the patient's consent to telehealth for [facility].   Date:  01/14/2019   ID:  Brittany [name], DOB Mar 29, 1961, MRN [id number]  Location: patient's home  Provider location: [address] [place]  Evaluation Performed: Follow-up visit  PCP:  [name], MD  Cardiologist:  No primary care provider on file.  Electrophysiologist:  Dr Elberta Fortis  Chief Complaint:  SSS  History of Present Illness:    Brittany [name] is a 58 y.o. female who presents via audio/video conferencing for a telehealth visit today.  Since last being seen in our clinic, the patient reports doing very well.  Today, she denies symptoms of palpitations, chest pain, shortness of breath,  lower extremity edema, dizziness, presyncope, or syncope.  The patient is otherwise without complaint today.  The patient denies symptoms of fevers, chills, cough, or new SOB worrisome for COVID 19.  She has a history of hypertension, hyperlipidemia, CVA, and tobacco abuse.  She received TPA for her CVA.  She was noted to have a 3-second pause on her Linq monitor as well as junctional rhythm.  He thus had a Medtronic dual-chamber pacemaker implanted 01/01/2018.  Today, denies symptoms of palpitations, chest pain, shortness of breath, orthopnea, PND, lower extremity edema, claudication, dizziness, presyncope, syncope, bleeding, or neurologic sequela. The patient is tolerating medications without difficulties.    Past Medical History:  Diagnosis Date  . Chronic lower back pain   . Depression   . Fibromyalgia   . High cholesterol   . Seizures (HCC) 11/2017   "day after she came home from hospital after having stroke" (01/01/2018)  . Stroke [facility]) 12/06/2017   "speech issues since" (01/01/2018)  . Type  II diabetes mellitus (HCC)     Past Surgical History:  Procedure Laterality Date  . BACK SURGERY    . CHOLECYSTECTOMY    . INSERT / REPLACE / REMOVE PACEMAKER  01/01/2018  . IR CT HEAD LTD  12/06/2017  . IR PERCUTANEOUS ART THROMBECTOMY/INFUSION INTRACRANIAL INC DIAG ANGIO  12/06/2017  . LOOP RECORDER INSERTION N/A 12/12/2017   Procedure: LOOP RECORDER INSERTION;  Surgeon: Regan Lemming, MD;  Location: [facility] INVASIVE CV LAB;  Service: Cardiovascular;  Laterality: N/A;  . LOOP RECORDER REMOVAL  01/01/2018  . LOOP RECORDER REMOVAL N/A 01/01/2018   Procedure: LOOP RECORDER REMOVAL;  Surgeon: Regan Lemming, MD;  Location: [facility] INVASIVE CV LAB;  Service: Cardiovascular;  Laterality: N/A;  . PACEMAKER IMPLANT N/A 01/01/2018   Procedure: PACEMAKER IMPLANT;  Surgeon: Regan Lemming, MD;  Location: [facility] INVASIVE CV LAB;  Service: Cardiovascular;  Laterality: N/A;  . RADIOLOGY WITH ANESTHESIA N/A 12/06/2017   Procedure: CODE STROKE;  Surgeon: Julieanne Cotton, MD;  Location: [facility] OR;  Service: Radiology;  Laterality: N/A;  . TEE WITHOUT CARDIOVERSION N/A 12/10/2017   Procedure: TRANSESOPHAGEAL ECHOCARDIOGRAM (TEE);  Surgeon: Jake Bathe, MD;  Location: [facility] ENDOSCOPY;  Service: Cardiovascular;  Laterality: N/A;  . THROMBECTOMY FEMORAL ARTERY Right 12/07/2017   Procedure: THROMBECTOMY RIGHT FEMORAL ARTERY;  Surgeon: Maeola Harman, MD;  Location: [facility] OR;  Service: Vascular;  Laterality: Right;  . TONSILLECTOMY      Current Outpatient Medications  Medication Sig Dispense Refill  . acetaminophen (TYLENOL) 500 MG tablet Take 1,000 mg by mouth  every 6 (six) hours as needed for fever (pain).    Marland Kitchen atorvastatin (LIPITOR) 40 MG tablet Take 1 tablet (40 mg total) by mouth daily. 90 tablet 1  . B Complex-Biotin-FA (MULTI-B COMPLEX) CAPS Take one a day ,B complex  multivitamin over the counter or use prenatal vitamin. 90 capsule 0  . clopidogrel (PLAVIX) 75 MG tablet Take 1 tablet (75 mg  total) by mouth daily. 90 tablet 3  . insulin aspart (NOVOLOG FLEXPEN) 100 UNIT/ML FlexPen 3 times a day (just before each meal), 07-23-13, and pen needles 4/day 15 mL 11  . Insulin Detemir (LEVEMIR FLEXTOUCH) 100 UNIT/ML Pen Inject 17 Units into the skin at bedtime. 15 mL   . irbesartan (AVAPRO) 150 MG tablet TAKE 1 TABLET(150 MG) BY MOUTH DAILY 90 tablet 3  . levETIRAcetam (KEPPRA) 500 MG tablet Take 1 tablet (500 mg total) by mouth 2 (two) times daily. 180 tablet 4  . primidone (MYSOLINE) 250 MG tablet Take 1 tablet (250 mg total) by mouth 2 (two) times daily. Start 1 tablet at bedtime for 2 weeks and increase if tolerated to 1 tablet twice daily 60 tablet 3  . sertraline (ZOLOFT) 100 MG tablet Take 2 tablets (200 mg total) by mouth at bedtime. 180 tablet 3   No current facility-administered medications for this visit.     Allergies:   Patient has no known allergies.   Social History:  The patient  reports that she quit smoking about 13 months ago. Her smoking use included cigarettes. She has a 38.00 pack-year smoking history. She has never used smokeless tobacco. She reports current alcohol use. She reports that she does not use drugs.   Family History:  The patient's  family history includes COPD in her maternal aunt; Cancer in her mother; Cancer (age of onset: 70) in her maternal grandmother; Dementia in her father; Diabetes in her father and mother; Heart disease in her father and mother; Parkinsonism in her mother.   ROS:  Please see the history of present illness.   All other systems are personally reviewed and negative.    Exam:    Vital Signs:  BP (!) 144/67   Pulse 78   Wt 177 lb (80.3 kg)   BMI 29.91 kg/m   Well appearing, alert and conversant, regular work of breathing,  good skin color Eyes- anicteric, neuro- grossly intact, skin- no apparent rash or lesions or cyanosis, mouth- oral mucosa is pink  Labs/Other Tests and Data Reviewed:    Recent Labs: 08/28/2018: ALT  13; BUN 16; Creatinine, Ser 0.78; Potassium 4.5; Sodium 146 09/03/2018: TSH 2.240   Wt Readings from Last 3 Encounters:  01/14/19 177 lb (80.3 kg)  11/02/18 179 lb (81.2 kg)  10/05/18 173 lb 3.2 oz (78.6 kg)     Other studies personally reviewed: Additional studies/ records that were reviewed today include: ECG 04/06/2018 personally reviewed Review of the above records today demonstrates:   Sinus rhythm   Last device remote is reviewed from PaceART PDF dated 01/04/19 which reveals normal device function, short SVT episodes noted   ASSESSMENT & PLAN:    1.  Junctional rhythm with syncope: Status post Medtronic pacemaker implanted 01/01/2018.  Device functioning appropriately.  No changes.  2.  Cryptogenic stroke: No atrial fibrillation noted on pacemaker.   COVID 19 screen The patient denies symptoms of COVID 19 at this time.  The importance of social distancing was discussed today.  Follow-up:  1 tear Next remote: 04/05/19  Current medicines  are reviewed at length with the patient today.   The patient does not have concerns regarding her medicines.  The following changes were made today:  none  Labs/ tests ordered today include:  No orders of the defined types were placed in this encounter.    Patient Risk:  after full review of this patients clinical status, I feel that they are at moderate risk at this time.  Today, I have spent 11 minutes with the patient with telehealth technology discussing stroke, pacemaker .    Signed, Emilija Bohman Jorja LoaMartin Lakedra Washington, MD  01/14/2019 3:09 PM     [facility] [address] [place] [address] [phone] (office) [phone] (fax)

## 2019-01-30 ENCOUNTER — Other Ambulatory Visit: Payer: Self-pay | Admitting: Neurology

## 2019-02-02 NOTE — Telephone Encounter (Signed)
Ok, please forward request to PCP.  Thank you.

## 2019-02-03 ENCOUNTER — Ambulatory Visit: Payer: BLUE CROSS/BLUE SHIELD | Admitting: Adult Health

## 2019-02-03 ENCOUNTER — Other Ambulatory Visit: Payer: Self-pay

## 2019-02-03 NOTE — Telephone Encounter (Signed)
Refill request sent to [facility]. Pts PCP is managing and prescribing lipitor.

## 2019-02-09 ENCOUNTER — Other Ambulatory Visit: Payer: Self-pay | Admitting: *Deleted

## 2019-02-09 ENCOUNTER — Telehealth: Payer: Self-pay

## 2019-02-09 ENCOUNTER — Other Ambulatory Visit: Payer: Self-pay

## 2019-02-09 ENCOUNTER — Encounter: Payer: Self-pay | Admitting: Adult Health

## 2019-02-09 ENCOUNTER — Ambulatory Visit (INDEPENDENT_AMBULATORY_CARE_PROVIDER_SITE_OTHER): Payer: BC Managed Care – PPO | Admitting: Adult Health

## 2019-02-09 DIAGNOSIS — F419 Anxiety disorder, unspecified: Secondary | ICD-10-CM | POA: Diagnosis not present

## 2019-02-09 DIAGNOSIS — H9190 Unspecified hearing loss, unspecified ear: Secondary | ICD-10-CM

## 2019-02-09 DIAGNOSIS — E1149 Type 2 diabetes mellitus with other diabetic neurological complication: Secondary | ICD-10-CM

## 2019-02-09 DIAGNOSIS — E785 Hyperlipidemia, unspecified: Secondary | ICD-10-CM

## 2019-02-09 DIAGNOSIS — I639 Cerebral infarction, unspecified: Secondary | ICD-10-CM

## 2019-02-09 DIAGNOSIS — I63412 Cerebral infarction due to embolism of left middle cerebral artery: Secondary | ICD-10-CM | POA: Diagnosis not present

## 2019-02-09 DIAGNOSIS — R4701 Aphasia: Secondary | ICD-10-CM

## 2019-02-09 DIAGNOSIS — G250 Essential tremor: Secondary | ICD-10-CM

## 2019-02-09 DIAGNOSIS — I10 Essential (primary) hypertension: Secondary | ICD-10-CM

## 2019-02-09 DIAGNOSIS — Z794 Long term (current) use of insulin: Secondary | ICD-10-CM

## 2019-02-09 MED ORDER — INSULIN PEN NEEDLE 32G X 4 MM MISC: 0 refills | Status: DC

## 2019-02-09 MED ORDER — LORAZEPAM 0.5 MG PO TABS: 0.5000 mg | ORAL_TABLET | Freq: Two times a day (BID) | ORAL | 0 refills | Status: DC

## 2019-02-09 MED ORDER — PRIMIDONE 250 MG PO TABS: 250.0000 mg | ORAL_TABLET | Freq: Two times a day (BID) | ORAL | 3 refills | Status: DC

## 2019-02-09 MED ORDER — LEVETIRACETAM 500 MG PO TABS: 500.0000 mg | ORAL_TABLET | Freq: Two times a day (BID) | ORAL | 4 refills | Status: DC

## 2019-02-09 NOTE — Progress Notes (Signed)
[facility] [address] [place]. [place] [address] [phone]       FOLLOW-UP NOTE  [name] Date of Birth:  06-15-1961 Medical Record Number:  [id number]   Virtual Visit via Video Note  I connected with [name] on 02/09/19 at 12:45 PM EDT by a video enabled telemedicine application located remotely in my own home and verified that I am speaking with the correct person using two identifiers who was located at their own home and accompanied by her son.   I discussed the limitations of evaluation and management by telemedicine and the availability of in person appointments. The patient expressed understanding and agreed to proceed.   HPI:   Hospital summary: Brittany Writer Sowersis an 58 y.o.femalewith a PMH of HTN, HLD, DM, Hx of prior CVA and tobacco abuse who presents to the ED as a code stroke with reports of acute onset Global Aphasia, last seen normal at approximately 09:15 this morning 12/06/2017. CT Head code stroke reveals Acute Left Insula Infarct with suspected embolus in distal Left M2. No contraindications were identified in patients history and IV tPA was administered. IR was notified and patient family was briefed by Dr Laurence Slate. Risks and benefits of IR procedure was explained and family wants to proceed. NIH Stroke Scale: 10. Modified Rankin:Score=1. Patient was administered IV TPA and   She subsequently underwent mechanical thrombectomy of left M2 occlusion with TICI 2b reperfusion.gastritis sig echo was unremarkable. Transesophageal echo showed no Source of embolism. LDL cholesterol 81 mg percent. Hemoglobin A1c was 11.5. She was on aspirin prior to admission which was changed to Plavix. Patient did well and was subsequently discharged home with home physical occupational speech therapy. Patient subsequently had symptomatic bradycardia and was seen by Dr. Elberta Fortis electrophysiologist and underwent pacemaker insertion with removal of the loop  recorder on 01/02/18. She has not yet found A. Fib. The patient's Plavix and Lipitor have been inadvertently discontinued following the admission for pacemaker. She states her sugars under good control. She is still on Keppra which was started when she went back on 12/13/17 to the ER with a witnessed seizure. She's had no further breakthrough seizures. She still has residual aphasia but it is improving. Her home speech therapy is recommending outpatient speech therapy now. Physical and occupational therapy have discharged her. She is still out of work and wants FMLA paperwork filled out.  Update 04/15/2018: Patient is being seen today for follow-up and is accompanied by her two sons.  She continues to have expressive aphasia > receptive aphasia but she continues to participate in speech therapy at neuro rehab clinic.  She states some days are better than others but overall has been improving.  She does notice increased difficulty with expressive aphasia with increased stress or fatigue.  She also has been having difficulty with processing information.  She continues to take aspirin and Plavix without side effects of bleeding or bruising.  Continues to take Lipitor without side effects myalgias.  Blood pressure today satisfactory 125/70.  Patient does not monitor this at home and this was highly recommended.  She does monitor glucose levels at home which have been fluctuating but she does have an appointment scheduled on 05/04/2018 for her first visit with endocrinologist.  She continues to take Keppra 500 mg twice daily without recent seizure activity.  Patient has not returned to work at this time working at [employer] due to continued global aphasia.  Patient deals greatly with  the public and on the phone with collections department and patient continues to have difficulty with expressive aphasia especially under stressful situations.  She is currently living at home with her husband, son and father  who has Alzheimer's.  Patient states she was told by her cardiologist that she can start driving at this time but educated patient on [place] law stating that she is unable to drive for 6 months after seizure activity.  Patient verbalized understanding.  Patient has been going through stressful situation at home with her son, daughter-in-law and her children as far as divorce and custody battle.  Since this is been occurring, patient has had increase in fluctuation with her expressive aphasia.  Her receptive aphasia continues to improve but does misunderstand words at times and continues to have issues with processing information.  Denies new or worsening stroke/TIA symptoms.  Update 11/02/2018 : She returns for follow-up after last visit 6 months ago.  She is accompanied by her son-in-law.  Patient has noticed worsening of her hand and voice tremor since she was taken off the primidone following her stroke.  The tremors are present at rest and do increase with action and do interfere with her day-to-day activities.  She was started recently back on primidone 2 weeks ago by primary physician and took 125 mg for 2 weeks.  She increase it 250 mg for couple of days but ran out of it and has not been taking the medicine for the last for 5 days.  She has not had any recurrent stroke or TIA symptoms.  She remains on aspirin and Plavix and tolerating it well without bleeding or bruising.  Her blood pressure is well controlled today it is 129/73.  She states her sugars work controlled and the last A1c was below 7.  She is tolerating Lipitor well without muscle aches and pains and last lipid profile checked in December last year was satisfactory.  She continues to have mild residual aphasia from the stroke but is able to communicate if she speaks slowly and deliberately.  Update 02/09/2019 virtual visit: Follow-up visit today for stroke and essential tremors.  She is currently on primidone 250 mg twice daily with overall  improvement of her tremors.  She is now able to use utensils to feed herself without difficulty and no longer interfere with her day-to-day activity.  She is tolerating primidone without side effects.  Residual stroke deficit of expressive aphasia which has recently worsened with increased stress due to recent loss of her father and increase anxiety and worrying over pandemic and protesting.  She continues on Zoloft 200 mg daily which was previously stable.  Her anxiety is worse at night when there is not as much going on and is unable to leave her house due to pandemic.  She becomes tearful speaking about this.  She has nonfluent speech with frequent hesitancy as she states she knows what words she wants to say but unable to get it out.  She denies any other neurological worsening.  She currently lives with her husband, son and grandchild but is able to maintain all ADLs and IADLs independently.  She continues on Keppra without seizure activity or side effects.  Continues on Plavix and atorvastatin without side effects.  Blood pressure not routinely monitored at home.  Glucose levels have been stable and continues to follow with endocrinology.  Repeat carotid duplex and transcranial Doppler on 11/12/2018 unremarkable.  No further concerns at this time.    ROS:  14 system review of systems is positive for weight gain, wheezing, hearing loss, easy bruising, tremor, snoring and all other systems negative PMH:  Past Medical History:  Diagnosis Date   Chronic lower back pain    Depression    Fibromyalgia    High cholesterol    Seizures (HCC) 11/2017   "day after she came home from hospital after having stroke" (01/01/2018)   Stroke (HCC) 12/06/2017   "speech issues since" (01/01/2018)   Type II diabetes mellitus (HCC)     Social History:  Social History   Socioeconomic History   Marital status: Married    Spouse name: [name]   Number of children: 2   Years of education: 14   Highest  education level: Not on file  Occupational History   Not on file  Social Needs   Financial resource strain: Not on file   Food insecurity:    Worry: Not on file    Inability: Not on file   Transportation needs:    Medical: Not on file    Non-medical: Not on file  Tobacco Use   Smoking status: Former Smoker    Packs/day: 1.00    Years: 38.00    Pack years: 38.00    Types: Cigarettes    Last attempt to quit: 12/06/2017    Years since quitting: 1.1   Smokeless tobacco: Never Used  Substance and Sexual Activity   Alcohol use: Yes    Comment: 01/01/2018 "nothing since stroke 11/2017"   Drug use: No   Sexual activity: Not Currently  Lifestyle   Physical activity:    Days per week: Not on file    Minutes per session: Not on file   Stress: Not on file  Relationships   Social connections:    Talks on phone: Not on file    Gets together: Not on file    Attends religious service: Not on file    Active member of club or organization: Not on file    Attends meetings of clubs or organizations: Not on file    Relationship status: Not on file   Intimate partner violence:    Fear of current or ex partner: Not on file    Emotionally abused: Not on file    Physically abused: Not on file    Forced sexual activity: Not on file  Other Topics Concern   Not on file  Social History Narrative   Patient is married ([name]) and lives at home with her family   Patient has two children.   Patient works at [employer].   Patient has a college education.   Patient is right-handed.   Patient drinks 3 cups of coffee M-F.       Medications:   Current Outpatient Medications on File Prior to Visit  Medication Sig Dispense Refill   acetaminophen (TYLENOL) 500 MG tablet Take 1,000 mg by mouth every 6 (six) hours as needed for fever (pain).     atorvastatin (LIPITOR) 40 MG tablet Take 1 tablet (40 mg total) by mouth daily. 90 tablet 1   B Complex-Biotin-FA (MULTI-B COMPLEX)  CAPS Take one a day ,B complex  multivitamin over the counter or use prenatal vitamin. 90 capsule 0   clopidogrel (PLAVIX) 75 MG tablet Take 1 tablet (75 mg total) by mouth daily. 90 tablet 3   insulin aspart (NOVOLOG FLEXPEN) 100 UNIT/ML FlexPen 3 times a day (just before each meal), 07-23-13, and pen needles 4/day 15 mL 11  Insulin Detemir (LEVEMIR FLEXTOUCH) 100 UNIT/ML Pen Inject 17 Units into the skin at bedtime. 15 mL    irbesartan (AVAPRO) 150 MG tablet TAKE 1 TABLET(150 MG) BY MOUTH DAILY 90 tablet 3   levETIRAcetam (KEPPRA) 500 MG tablet Take 1 tablet (500 mg total) by mouth 2 (two) times daily. 180 tablet 4   primidone (MYSOLINE) 250 MG tablet Take 1 tablet (250 mg total) by mouth 2 (two) times daily. Start 1 tablet at bedtime for 2 weeks and increase if tolerated to 1 tablet twice daily 60 tablet 3   sertraline (ZOLOFT) 100 MG tablet Take 2 tablets (200 mg total) by mouth at bedtime. 180 tablet 3   No current facility-administered medications on file prior to visit.     Allergies:  No Known Allergies  GAD 7 : Generalized Anxiety Score 02/09/2019  Nervous, Anxious, on Edge 2  Control/stop worrying 2  Worry too much - different things 3  Trouble relaxing 3  Restless 3  Easily annoyed or irritable 2  Afraid - awful might happen 3  Total GAD 7 Score 18  Anxiety Difficulty Very difficult    General: well developed, well nourished, pleasant middle-aged Caucasian female, seated, tearful throughout visit Head: head normocephalic and atraumatic.    Neurologic Exam Mental Status: Awake and fully alert.  Moderate expressive aphasia. Oriented to place and time. Recent and remote memory intact. Attention span, concentration and fund of knowledge appropriate. Mood and affect appropriate.  Impaired comprehension with son having to repeat questions. Cranial Nerves: Extraocular movements full without nystagmus.  Hearing diminished. Facial sensation intact. Mild right nasolabial fold  asymmetry., tongue, palate moves normally and symmetrically.  Motor: Normal bulk and tone. Normal strength in all tested extremity muscles.  Very mild action tremor noted.  Unable to appreciate tremor at rest. Sensory.: intact to light touch Coordination: Rapid alternating movements normal in all extremities. Finger-to-nose and heel-to-shin performed accurately bilaterally. Gait and Station: Arises from chair without difficulty. Stance is normal. Gait demonstrates normal stride length and balance .  Reflexes: UTA   IMAGING:  VAS US CAROTID DUPLEX BILATERAL 11/12/2018 Summary: Right Carotid: Velocities in the right ICA are consistent with a 1-39% stenosis. Left Carotid: Velocities in the left ICA are consistent with a 1-39% stenosis. Vertebrals:  Bilateral vertebral arteries demonstrate antegrade flow. Subclavians: Normal flow hemodynamics were seen in bilateral subclavian arteries.  VAS [place] TRANSCRANIAL DOPPLER 11/12/2018 Summary: Absent bitemporal windows limits evaluation of anterior circulation . Normal mean flow velocities in both opthalmics, left carotid siphon and posterior circulation vessels.    ASSESSMENT: 66-75 year old lady with aphasia secondary to left MCA infarct in April 2019 of cryptogenic etiology treated with IV TPA followed by mechanical thrombectomy with good recanalization. She also had symptomatic seizure after discharge and residual stroke deficit of expressive aphasia.  Restarted on primidone due to essential tremors.  At today's visit, worsening expressive aphasia due to increase stress and anxiety.  PLAN: -Ativan 0.5 mg twice daily as needed for situational anxiety and increased stress with worsening neurological symptoms.  Also encouraged stress reduction techniques -Discussion regarding short-term use of this medication and may need to consider additional antidepressant medication in the future with referral to psychiatry -Patient requesting ENT evaluation due to  decreased hearing since stroke -ambulatory referral ENT placed -Continue primidone 250 mg twice daily for essential tremors -Continue Keppra 500 mg twice daily for seizure prophylaxis -Continue Plavix and atorvastatin for secondary stroke prevention -Highly encouraged establishing care with new PCP as her prior  PCP left office for ongoing management of anxiety, depression, HTN, HLD and DM -Continue strict control of hypertension with blood pressure goal below 130/90, lipids with LDL cholesterol goal below 70 mg percent and diabetes with hemoglobin A1c goal below 7.  She was advised to eat a healthy diet with lots of fruits, vegetables, cereals and whole grains and to be active and exercise 30 minutes every day.    She will return to office in 1 month for follow-up of anxiety as she does not currently have PCP  Greater than 50% of time during this 45 minute non-face-to-face visit was spent on discussing increased stress/anxiety with worsening of neurological symptoms, residual expressive aphasia, essential tremors and importance of stroke risk factor management, planning of further management, discussion with patient and family and coordination of care  George Hugh, AGNP-BC  [facility] [address] [place], [place] [address]  Phone [phone] Fax [phone] Note: This document was prepared with digital dictation and possible smart phrase technology. Any transcriptional errors that result from this process are unintentional.

## 2019-02-09 NOTE — Progress Notes (Signed)
I agree with the above plan 

## 2019-02-09 NOTE — Telephone Encounter (Signed)
Late entry.Rn spoke to [place] when appt was made pts son. HE gave verbal consent to do video and to file insurance.   Link was text to his cell number today at 856am.

## 2019-02-15 IMAGING — CT CT ABD-PELV W/O CM
2 of 4 series · 15 of 46 positions shown, 17 images · non-contrast
Comparison: None.

CLINICAL DATA: 56-year-old female with history of diabetes and
cholecystectomy. Evaluate for intra-abdominal hemorrhage.

EXAM:
CT ABDOMEN AND PELVIS WITHOUT CONTRAST
TECHNIQUE: Multidetector CT imaging of the abdomen and pelvis was performed
following the standard protocol without IV contrast.

[Series 3: ap without · axial · non-contrast · 0.77mm/px · z∈[+762,+1192]mm · 12 of 98 slices shown, 14 images]
[im 6/98  soft-tissue]
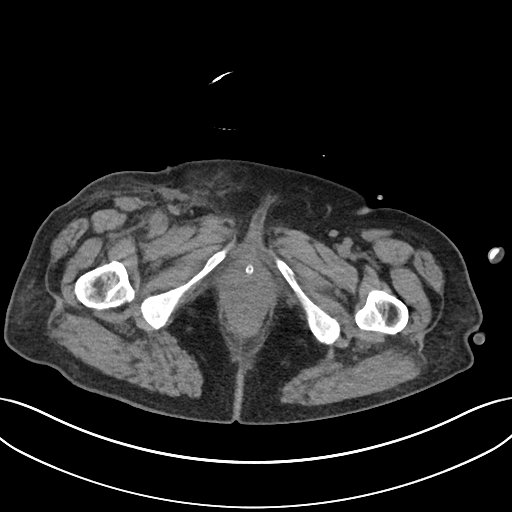
[im 6/98  bone]
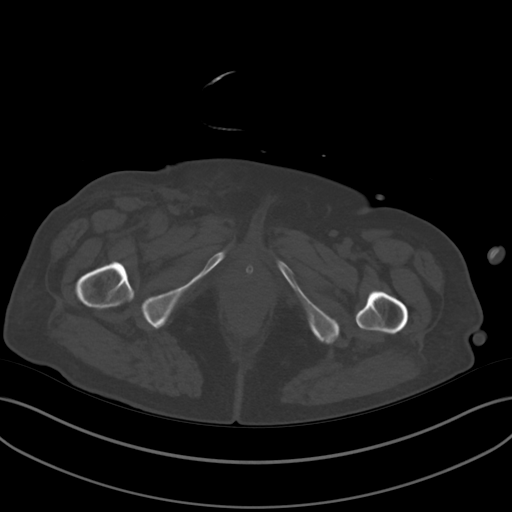
[im 17/98  soft-tissue]
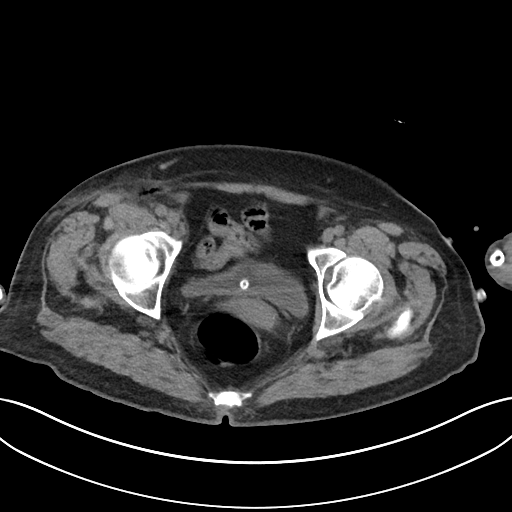
[im 22/98  soft-tissue]
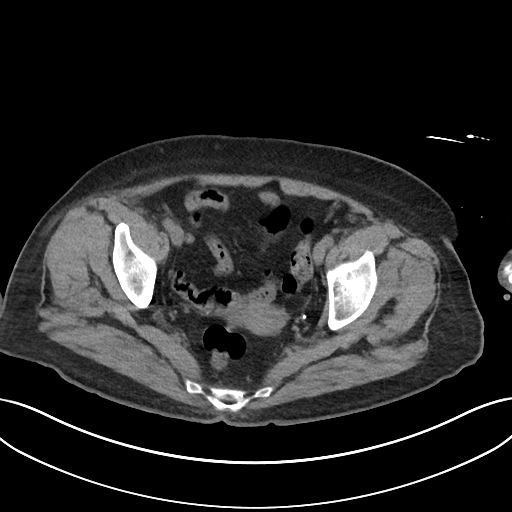
[im 27/98  soft-tissue]
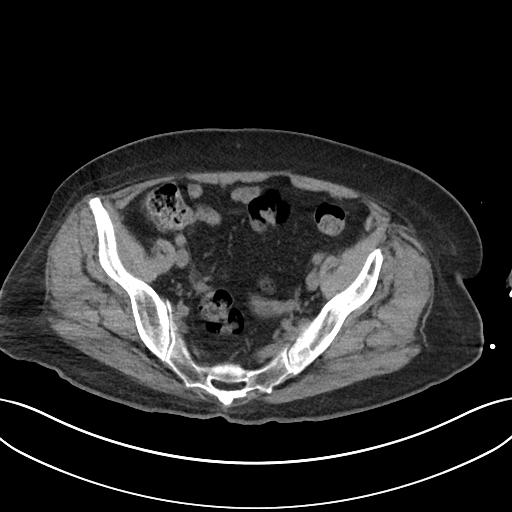
[im 38/98  soft-tissue]
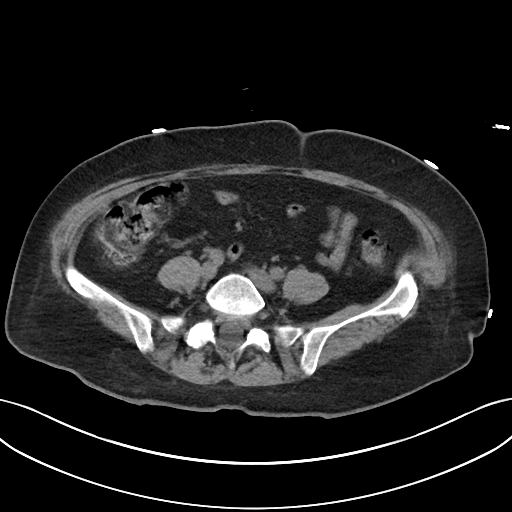
[im 44/98  soft-tissue]
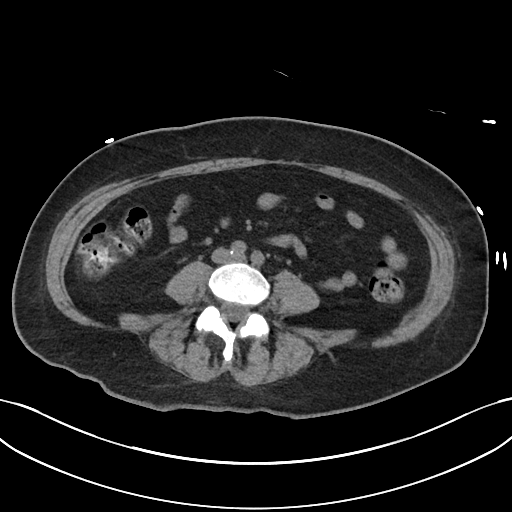
[im 54/98  soft-tissue]
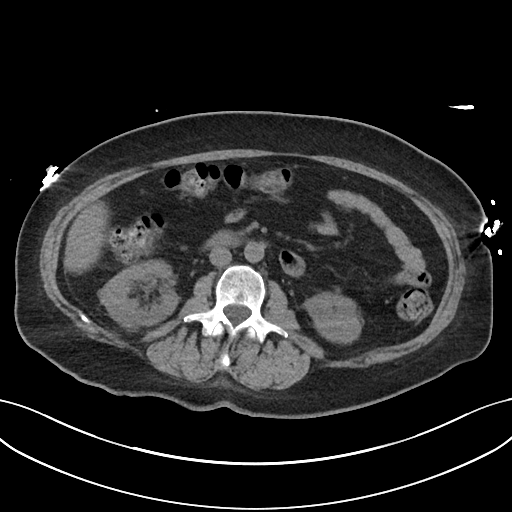
[im 60/98  soft-tissue]
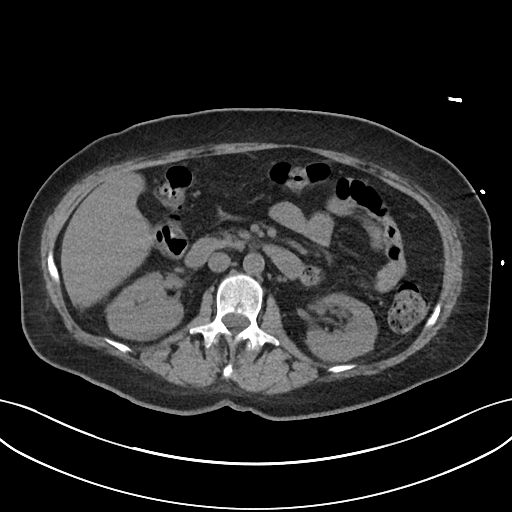
[im 71/98  soft-tissue]
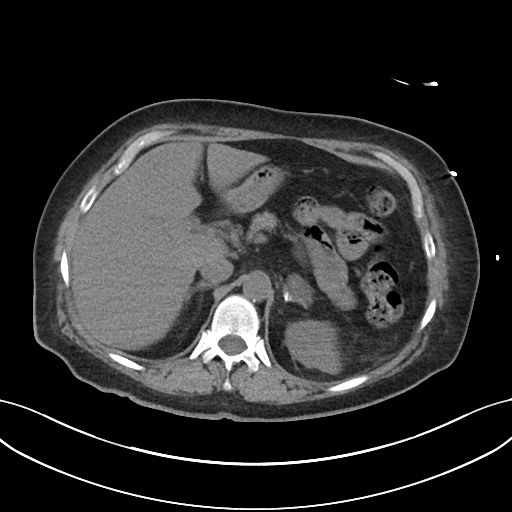
[im 71/98  bone]
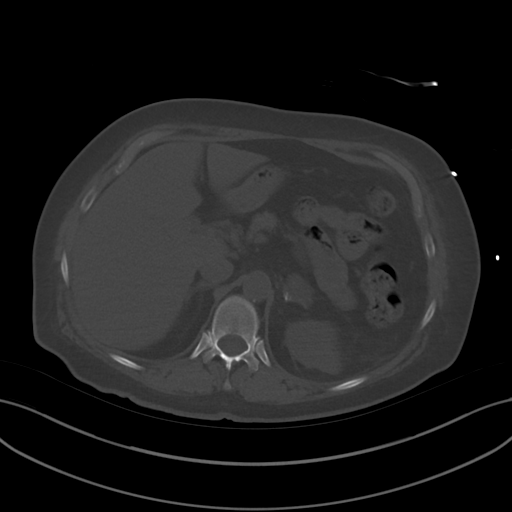
[im 76/98  soft-tissue]
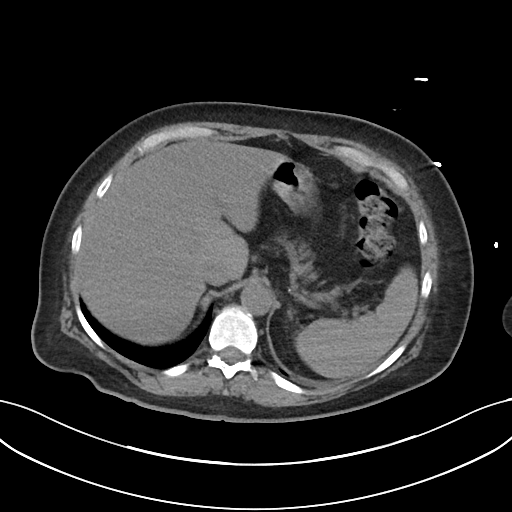
[im 81/98  soft-tissue]
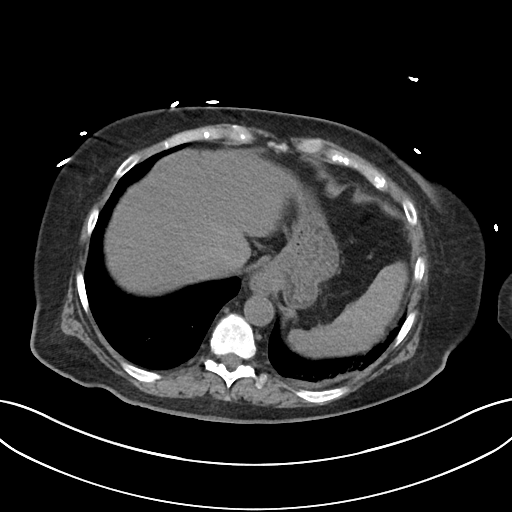
[im 92/98  soft-tissue]
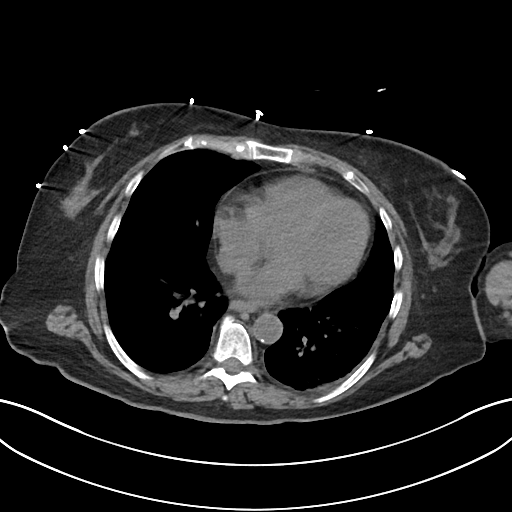

[Series 6: cor · coronal · 0.71mm/px · 3 of 79 slices shown]
[im 27/79  soft-tissue]
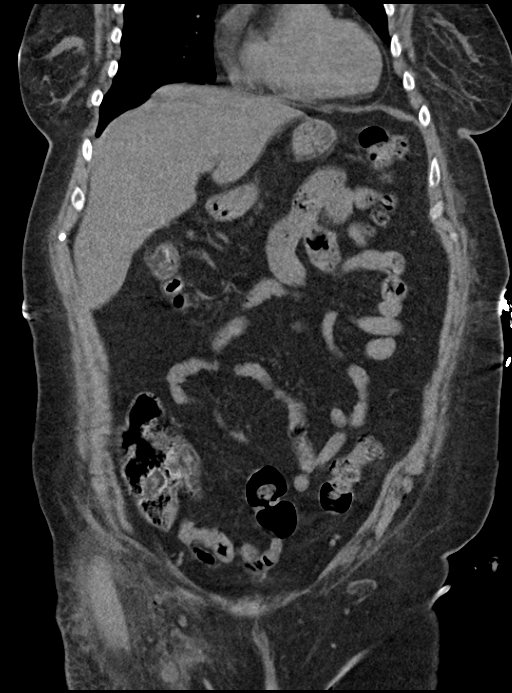
[im 35/79  soft-tissue]
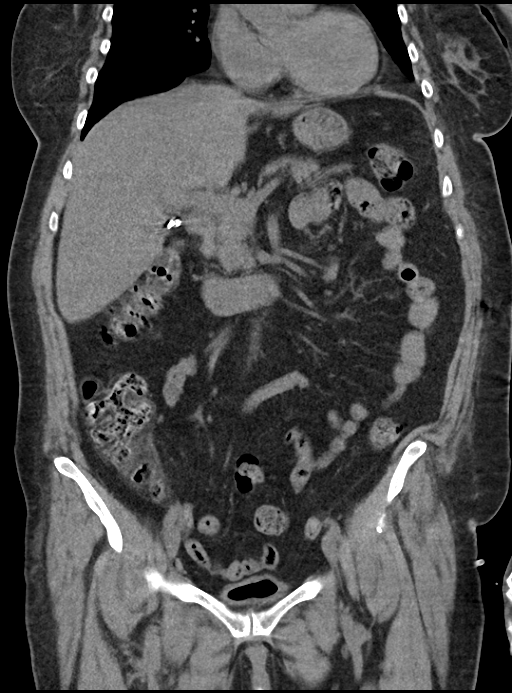
[im 44/79  soft-tissue]
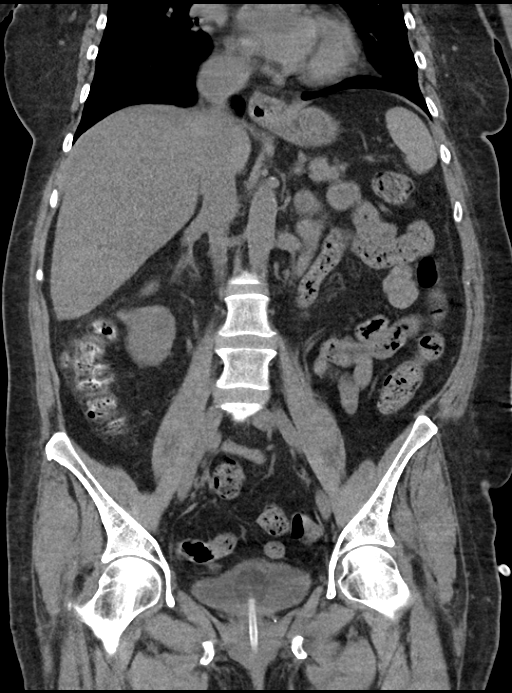

[15 of 46 positions shown; findings below may reference images not displayed]

FINDINGS: Evaluation of this exam is limited in the absence of intravenous
contrast.

Lower chest: Left lung base subpleural atelectasis/scarring. There
is hypoattenuation of the cardiac blood pool suggestive of a degree
of anemia. Clinical correlation is recommended. Coronary vascular
calcification primarily involving the LAD.

No intra-abdominal free air or free fluid.

Hepatobiliary: Diffuse fatty liver. No intrahepatic biliary ductal
dilatation. Cholecystectomy.

Pancreas: Unremarkable. No pancreatic ductal dilatation or
surrounding inflammatory changes.

Spleen: Normal in size without focal abnormality.

Adrenals/Urinary Tract: The right adrenal gland is unremarkable.
There is a 3 cm indeterminate left adrenal nodule with small
peripheral calcification. This nodule is not well characterized.
Further evaluation with MRI is recommended. There is a 3 mm
nonobstructing stone in the inferior pole of the left kidney. No
hydronephrosis. There is no hydronephrosis or nephrolithiasis on the
right. The visualized ureters appear unremarkable. The urinary
bladder is decompressed around a Foley catheter.

Stomach/Bowel: There is no bowel obstruction or active inflammation.
Normal appendix.

Vascular/Lymphatic: Mild aortoiliac atherosclerotic disease. No
portal venous gas. There is no adenopathy.

Reproductive: The uterus and ovaries are grossly unremarkable as
visualized.

Other: Postprocedure changes with small pockets of air and stranding
in the subcutaneous soft tissues of the right groin associated with
skin incision. There is no drainable fluid collection/abscess. No
hematoma.

Musculoskeletal: No acute or significant osseous findings.
IMPRESSION: 1. No acute intra-abdominal or pelvic pathology. No fluid collection
or hematoma as clinically concern.
2. Findings of anemia.
3. Fatty liver.
4. No bowel obstruction or active inflammation.  Normal appendix.
5. Small nonobstructing left renal calculus.  No hydronephrosis.
6. Post residual changes of the right groin as described. No
drainable fluid collection or hematoma.

## 2019-02-16 IMAGING — RF DG SWALLOWING FUNCTION - NRPT [facility]
1 series · 18 of 24 positions shown · non-contrast
Comparison: none

[Series 1: run · 15 acquisitions, 18 frames shown]
[im 1/15]
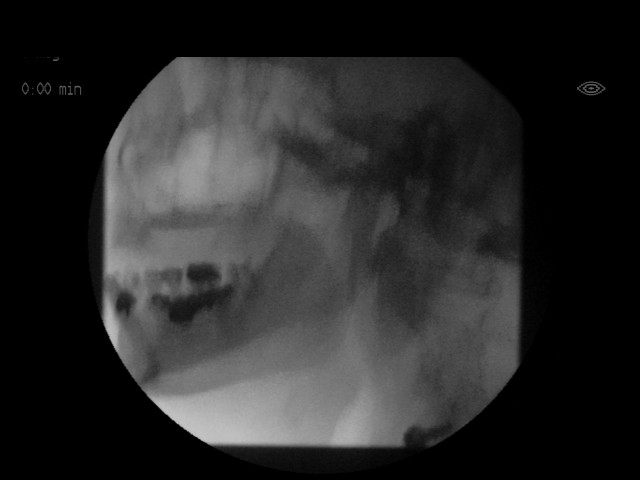
[im 2/15]
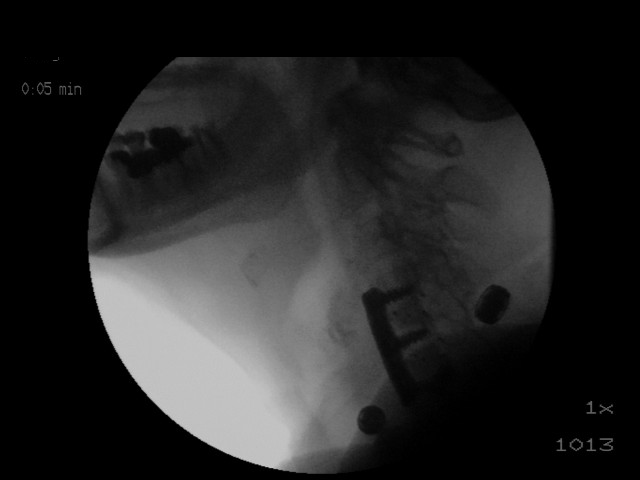
[im 3/15]
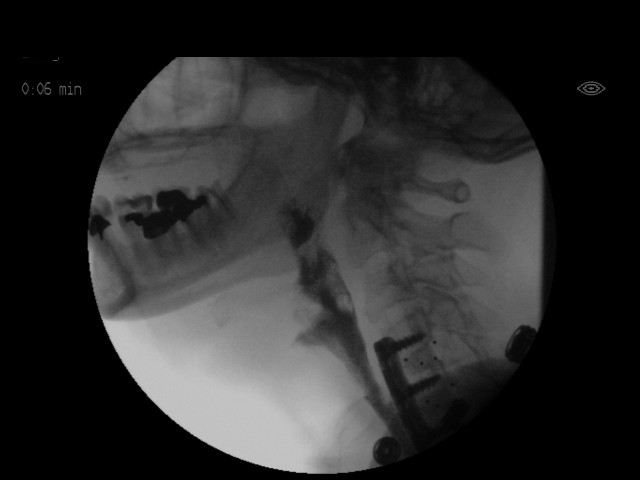
[im 3/15]
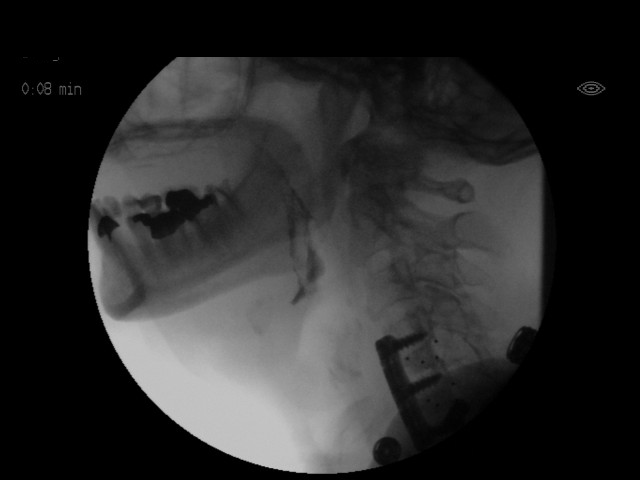
[im 5/15]
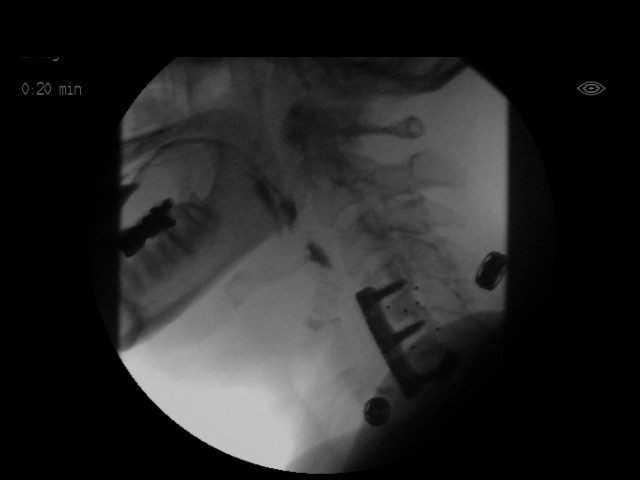
[im 5/15]
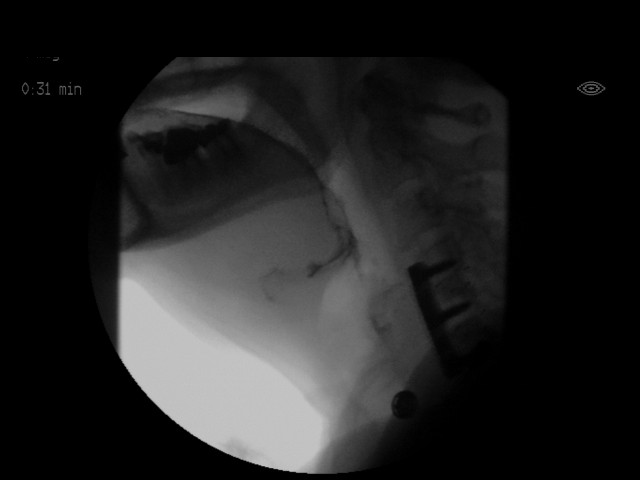
[im 6/15]
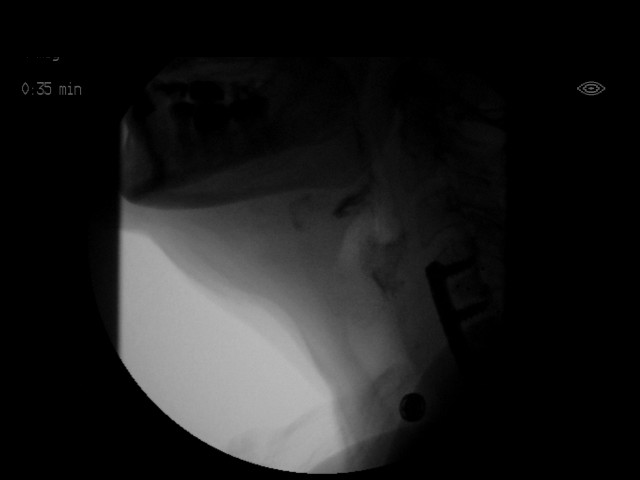
[im 7/15]
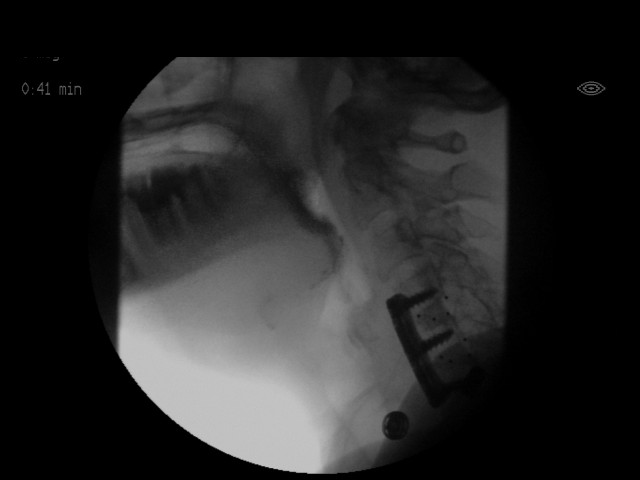
[im 8/15]
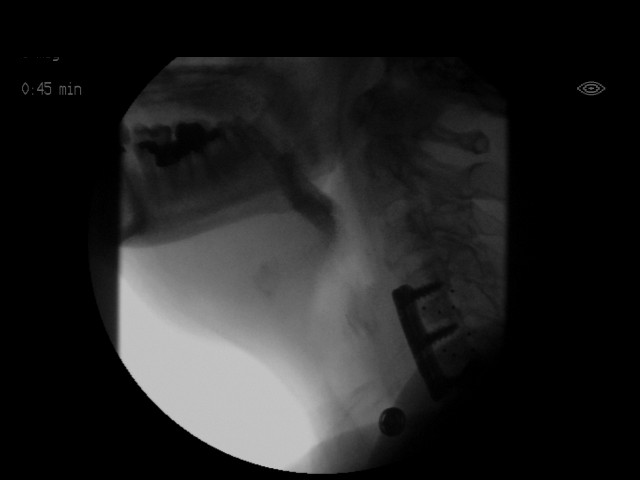
[im 8/15]
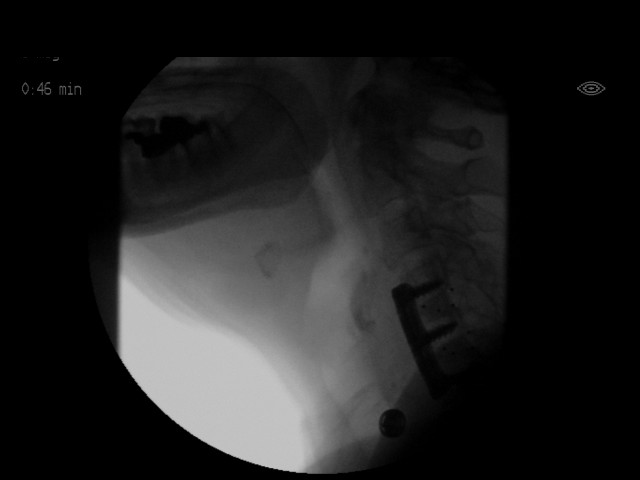
[im 10/15]
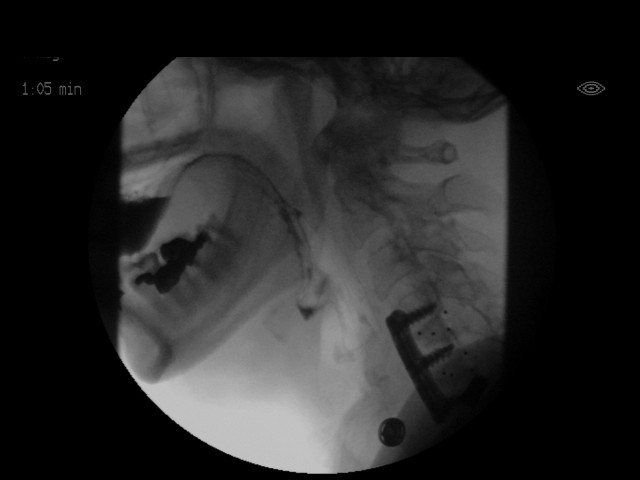
[im 10/15]
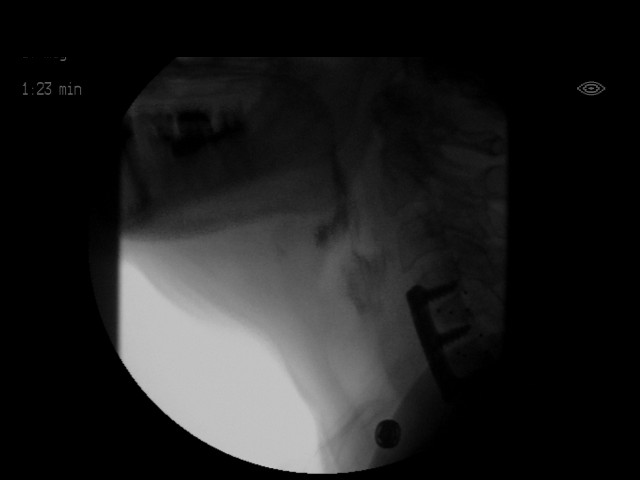
[im 11/15]
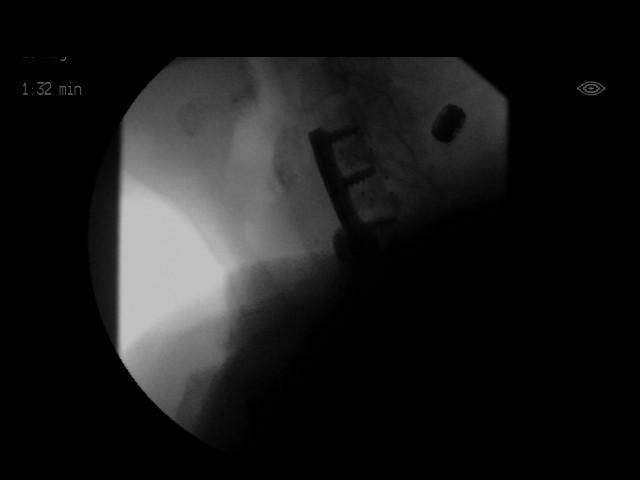
[im 12/15]
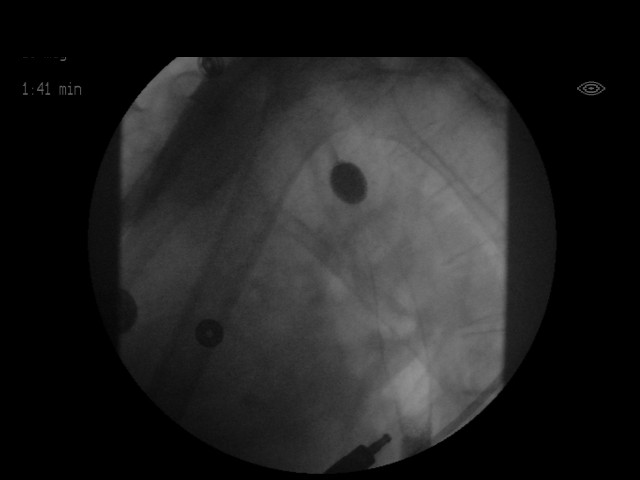
[im 13/15]
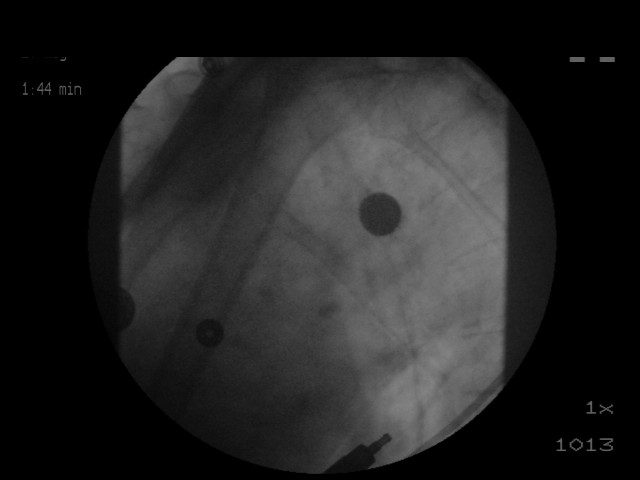
[im 13/15]
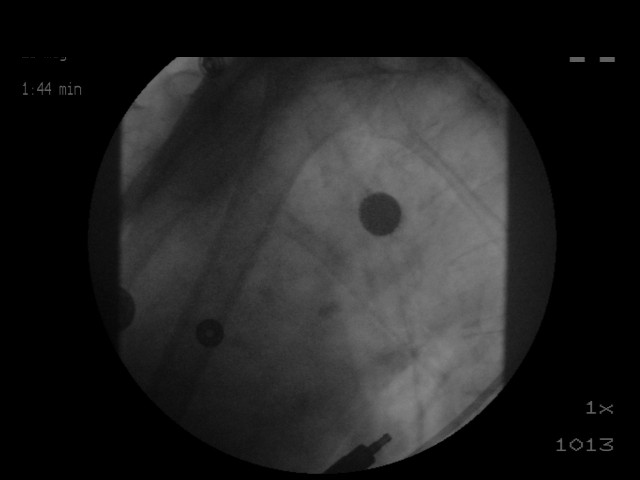
[im 15/15]
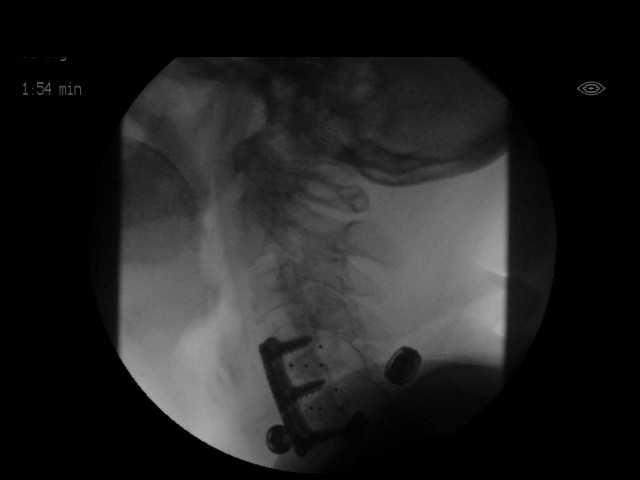
[im 15/15]
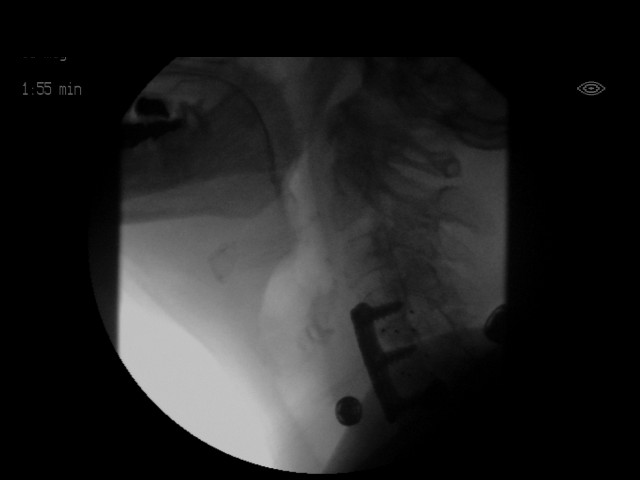

[18 of 24 positions shown; findings below may reference images not displayed]

FLUOROSCOPY FOR SWALLOWING FUNCTION STUDY:
Fluoroscopy was provided for swallowing function study, which was administered by a speech pathologist.  Final results and recommendations from this study are contained within the speech pathology report.

## 2019-02-25 DIAGNOSIS — J343 Hypertrophy of nasal turbinates: Secondary | ICD-10-CM | POA: Diagnosis not present

## 2019-02-25 DIAGNOSIS — F17200 Nicotine dependence, unspecified, uncomplicated: Secondary | ICD-10-CM | POA: Diagnosis not present

## 2019-02-25 DIAGNOSIS — H9313 Tinnitus, bilateral: Secondary | ICD-10-CM | POA: Diagnosis not present

## 2019-02-25 DIAGNOSIS — H903 Sensorineural hearing loss, bilateral: Secondary | ICD-10-CM | POA: Insufficient documentation

## 2019-03-03 ENCOUNTER — Telehealth: Payer: Self-pay

## 2019-03-03 NOTE — Telephone Encounter (Signed)
Fee paid for guardian long term disability form. Given to Hilda Blades in medical records to fax Form completed.

## 2019-03-03 NOTE — Telephone Encounter (Signed)
Pt guardian long term form faxed on 03/03/19

## 2019-03-08 ENCOUNTER — Other Ambulatory Visit: Payer: Self-pay

## 2019-03-10 ENCOUNTER — Ambulatory Visit (INDEPENDENT_AMBULATORY_CARE_PROVIDER_SITE_OTHER): Payer: BC Managed Care – PPO | Admitting: Endocrinology

## 2019-03-10 ENCOUNTER — Other Ambulatory Visit: Payer: Self-pay

## 2019-03-10 ENCOUNTER — Encounter: Payer: Self-pay | Admitting: Endocrinology

## 2019-03-10 VITALS — BP 124/70 | HR 84 | Ht 64.5 in | Wt 176.8 lb

## 2019-03-10 DIAGNOSIS — Z794 Long term (current) use of insulin: Secondary | ICD-10-CM | POA: Diagnosis not present

## 2019-03-10 DIAGNOSIS — E1149 Type 2 diabetes mellitus with other diabetic neurological complication: Secondary | ICD-10-CM | POA: Diagnosis not present

## 2019-03-10 LAB — POCT GLYCOSYLATED HEMOGLOBIN (HGB A1C): Hemoglobin A1C: 7.6 % — AB (ref 4.0–5.6)

## 2019-03-10 MED ORDER — LEVEMIR FLEXTOUCH 100 UNIT/ML ~~LOC~~ SOPN: 20.0000 [IU] | PEN_INJECTOR | Freq: Every day | SUBCUTANEOUS | Status: DC

## 2019-03-10 MED ORDER — NOVOLOG FLEXPEN 100 UNIT/ML ~~LOC~~ SOPN: PEN_INJECTOR | SUBCUTANEOUS | 11 refills | Status: DC

## 2019-03-10 NOTE — Progress Notes (Signed)
Subjective:    Patient ID: Brittany [name], female    DOB: [birth year], 58 y.o.   MRN: [id number]  HPI Pt returns for f/u of diabetes mellitus: DM type: Insulin-requiring type 2 Dx'ed: 8182 Complications: CVA Therapy: insulin since early 2019 GDM: 1987 DKA: never Severe hypoglycemia: never Pancreatitis: never Pancreatic imaging: normal on 2019 CT.   Other: son gives pt her insulin (due to CVA--he says he knows how to use syringe and vial); she gets multiple daily injections.   Interval history: she is making slow progress since CVA. she brings a record of her cbg's which I have reviewed today.  cbg varies from 50-247.  It is in general lowest at lunch and HS.  It is in general highest fasting.  She never misses the insulin.   Past Medical History:  Diagnosis Date  . Chronic lower back pain   . Depression   . Fibromyalgia   . High cholesterol   . Seizures ([place]) 11/2017   "day after she came home from hospital after having stroke" (01/01/2018)  . Stroke [facility]) 12/06/2017   "speech issues since" (01/01/2018)  . Type II diabetes mellitus ([place])     Past Surgical History:  Procedure Laterality Date  . BACK SURGERY    . CHOLECYSTECTOMY    . INSERT / REPLACE / REMOVE PACEMAKER  01/01/2018  . IR CT HEAD LTD  12/06/2017  . IR PERCUTANEOUS ART THROMBECTOMY/INFUSION INTRACRANIAL INC DIAG ANGIO  12/06/2017  . LOOP RECORDER INSERTION N/A 12/12/2017   Procedure: LOOP RECORDER INSERTION;  Surgeon: Constance Haw, MD;  Location: [place] [facility];  Service: Cardiovascular;  Laterality: N/A;  . LOOP RECORDER REMOVAL  01/01/2018  . LOOP RECORDER REMOVAL N/A 01/01/2018   Procedure: LOOP RECORDER REMOVAL;  Surgeon: Constance Haw, MD;  Location: [place] [facility];  Service: Cardiovascular;  Laterality: N/A;  . PACEMAKER IMPLANT N/A 01/01/2018   Procedure: PACEMAKER IMPLANT;  Surgeon: Constance Haw, MD;  Location: [place] [facility];  Service: Cardiovascular;  Laterality: N/A;  .  RADIOLOGY WITH ANESTHESIA N/A 12/06/2017   Procedure: CODE STROKE;  Surgeon: Luanne Bras, MD;  Location: [place];  Service: Radiology;  Laterality: N/A;  . TEE WITHOUT CARDIOVERSION N/A 12/10/2017   Procedure: TRANSESOPHAGEAL ECHOCARDIOGRAM (TEE);  Surgeon: Jerline Pain, MD;  Location: [facility];  Service: Cardiovascular;  Laterality: N/A;  . THROMBECTOMY FEMORAL ARTERY Right 12/07/2017   Procedure: THROMBECTOMY RIGHT FEMORAL ARTERY;  Surgeon: Waynetta Sandy, MD;  Location: [place];  Service: Vascular;  Laterality: Right;  . TONSILLECTOMY      Social History   Socioeconomic History  . Marital status: Married    Spouse name: [name]  . Number of children: 2  . Years of education: 31  . Highest education level: Not on file  Occupational History  . Not on file  Social Needs  . Financial resource strain: Not on file  . Food insecurity    Worry: Not on file    Inability: Not on file  . Transportation needs    Medical: Not on file    Non-medical: Not on file  Tobacco Use  . Smoking status: Former Smoker    Packs/day: 1.00    Years: 38.00    Pack years: 38.00    Types: Cigarettes    Quit date: 12/06/2017    Years since quitting: 1.2  . Smokeless tobacco: Never Used  Substance and Sexual Activity  . Alcohol use: Yes    Comment:  01/01/2018 "nothing since stroke 11/2017"  . Drug use: No  . Sexual activity: Not Currently  Lifestyle  . Physical activity    Days per week: Not on file    Minutes per session: Not on file  . Stress: Not on file  Relationships  . Social Musicianconnections    Talks on phone: Not on file    Gets together: Not on file    Attends religious service: Not on file    Active member of club or organization: Not on file    Attends meetings of clubs or organizations: Not on file    Relationship status: Not on file  . Intimate partner violence    Fear of current or ex partner: Not on file    Emotionally abused: Not on file    Physically abused: Not on  file    Forced sexual activity: Not on file  Other Topics Concern  . Not on file  Social History Narrative   Patient is married ([name]) and lives at home with her family   Patient has two children.   Patient works at [employer].   Patient has a college education.   Patient is right-handed.   Patient drinks 3 cups of coffee M-F.       Current Outpatient Medications on File Prior to Visit  Medication Sig Dispense Refill  . acetaminophen (TYLENOL) 500 MG tablet Take 1,000 mg by mouth every 6 (six) hours as needed for fever (pain).    Marland Kitchen. atorvastatin (LIPITOR) 40 MG tablet Take 1 tablet (40 mg total) by mouth daily. 90 tablet 1  . B Complex-Biotin-FA (MULTI-B COMPLEX) CAPS Take one a day ,B complex  multivitamin over the counter or use prenatal vitamin. 90 capsule 0  . clopidogrel (PLAVIX) 75 MG tablet Take 1 tablet (75 mg total) by mouth daily. 90 tablet 3  . Insulin Pen Needle (BD PEN NEEDLE NANO U/F) 32G X 4 MM MISC Use to check blood sugars daily at bedtime 90 each 0  . irbesartan (AVAPRO) 150 MG tablet TAKE 1 TABLET(150 MG) BY MOUTH DAILY 90 tablet 3  . levETIRAcetam (KEPPRA) 500 MG tablet Take 1 tablet (500 mg total) by mouth 2 (two) times daily. 180 tablet 4  . primidone (MYSOLINE) 250 MG tablet Take 1 tablet (250 mg total) by mouth 2 (two) times daily. Start 1 tablet at bedtime for 2 weeks and increase if tolerated to 1 tablet twice daily 60 tablet 3  . sertraline (ZOLOFT) 100 MG tablet Take 2 tablets (200 mg total) by mouth at bedtime. 180 tablet 3   No current facility-administered medications on file prior to visit.     No Known Allergies  Family History  Problem Relation Age of Onset  . Cancer Mother   . Diabetes Mother   . Heart disease Mother   . Parkinsonism Mother   . Diabetes Father   . Heart disease Father   . Dementia Father   . Cancer Maternal Grandmother 62       lung  . COPD Maternal Aunt     BP 124/70 (BP Location: Left Arm, Patient Position:  Sitting, Cuff Size: Large)   Pulse 84   Ht 5' 4.5" (1.638 m)   Wt 176 lb 12.8 oz (80.2 kg)   SpO2 94%   BMI 29.88 kg/m    Review of Systems Denies LOC    Objective:   Physical Exam VITAL SIGNS:  See vs page GENERAL: no distress Pulses: dorsalis pedis intact  bilat.   MSK: no deformity of the feet CV: no leg edema Skin:  no ulcer on the feet.  normal color and temp on the feet. Neuro: sensation is intact to touch on the feet   Lab Results  Component Value Date   HGBA1C 7.6 (A) 03/10/2019       Assessment & Plan:  Insulin-requiring type 2 DM: Based on the pattern of her cbg's, she needs some adjustment in her therapy Hypoglycemia: this limits aggressiveness of glycemic control CVA: in this setting, she should avoid hypoglycemia.   Patient Instructions  check your blood sugar 4 times a day: before the 3 meals, and at bedtime.  also check if you have symptoms of your blood sugar being too high or too low.  please keep a record of the readings and bring it to your next appointment here (or you can bring the meter itself).  You can write it on any piece of paper.  please call us sooner if your blood sugar goes below 70, or if you have a lot of readings over 200.   Please change the insulins to the numbers listed below.   Please come back for a follow-up appointment in 2 months.

## 2019-03-10 NOTE — Patient Instructions (Addendum)
check your blood sugar 4 times a day: before the 3 meals, and at bedtime.  also check if you have symptoms of your blood sugar being too high or too low.  please keep a record of the readings and bring it to your next appointment here (or you can bring the meter itself).  You can write it on any piece of paper.  please call us sooner if your blood sugar goes below 70, or if you have a lot of readings over 200.   Please change the insulins to the numbers listed below.   Please come back for a follow-up appointment in 2 months.

## 2019-03-11 ENCOUNTER — Ambulatory Visit (INDEPENDENT_AMBULATORY_CARE_PROVIDER_SITE_OTHER): Payer: BC Managed Care – PPO | Admitting: Adult Health

## 2019-03-11 ENCOUNTER — Encounter: Payer: Self-pay | Admitting: Adult Health

## 2019-03-11 VITALS — BP 124/75 | HR 92 | Temp 94.4°F | Ht 64.5 in | Wt 177.0 lb

## 2019-03-11 DIAGNOSIS — Z794 Long term (current) use of insulin: Secondary | ICD-10-CM

## 2019-03-11 DIAGNOSIS — I639 Cerebral infarction, unspecified: Secondary | ICD-10-CM

## 2019-03-11 DIAGNOSIS — E785 Hyperlipidemia, unspecified: Secondary | ICD-10-CM

## 2019-03-11 DIAGNOSIS — R4701 Aphasia: Secondary | ICD-10-CM

## 2019-03-11 DIAGNOSIS — I10 Essential (primary) hypertension: Secondary | ICD-10-CM

## 2019-03-11 DIAGNOSIS — I63412 Cerebral infarction due to embolism of left middle cerebral artery: Secondary | ICD-10-CM | POA: Diagnosis not present

## 2019-03-11 DIAGNOSIS — G250 Essential tremor: Secondary | ICD-10-CM | POA: Diagnosis not present

## 2019-03-11 DIAGNOSIS — E1149 Type 2 diabetes mellitus with other diabetic neurological complication: Secondary | ICD-10-CM

## 2019-03-11 DIAGNOSIS — F419 Anxiety disorder, unspecified: Secondary | ICD-10-CM

## 2019-03-11 MED ORDER — LORAZEPAM 0.5 MG PO TABS: 0.2500 mg | ORAL_TABLET | Freq: Two times a day (BID) | ORAL | 0 refills | Status: DC | PRN

## 2019-03-11 NOTE — Patient Instructions (Signed)
Continue clopidogrel 75 mg daily  and lipitor  for secondary stroke prevention  Continue to follow up with PCP regarding cholesterol and blood pressure management   Continue Ativan 0.25mg  - 0.5mg  for anxiety that can worsen your speech symptoms  Highly consider finding a therapist for further improvement of your anxiety and stress  Continue primidone 250mg  twice daily for tremors  Continue to monitor blood pressure at home  Maintain strict control of hypertension with blood pressure goal below 130/90, diabetes with hemoglobin A1c goal below 6.5% and cholesterol with LDL cholesterol (bad cholesterol) goal below 70 mg/dL. I also advised the patient to eat a healthy diet with plenty of whole grains, cereals, fruits and vegetables, exercise regularly and maintain ideal body weight.  Followup in the future with me in 3 months or call earlier if needed       Thank you for coming to see [place] at [facility]. I hope we have been able to provide you high quality care today.  You may receive a patient satisfaction survey over the next few weeks. We would appreciate your feedback and comments so that we may continue to improve ourselves and the health of our patients.

## 2019-03-11 NOTE — Progress Notes (Signed)
[facility] [address] [place]. [place] [id number] [phone]       FOLLOW-UP NOTE  Brittany [name] Date of Birth:  [birth year] Medical Record Number:  [id number]   Reason for visit: Follow-up regarding stroke with residual expressive aphasia  HPI:   Hospital summary: Brittany FareDenise W Sowersis an 58 y.o.femalewith a PMH of HTN, HLD, DM, Hx of prior CVA and tobacco abuse who presents to the ED as a code stroke with reports of acute onset Global Aphasia, last seen normal at approximately 09:15 this morning 12/06/2017. CT Head code stroke reveals Acute Left Insula Infarct with suspected embolus in distal Left M2. No contraindications were identified in patients history and IV tPA was administered. IR was notified and patient family was briefed by Dr Laurence SlateAroor. Risks and benefits of IR procedure was explained and family wants to proceed. NIH Stroke Scale: 10. Modified Rankin:Score=1. Patient was administered IV TPA and   She subsequently underwent mechanical thrombectomy of left M2 occlusion with TICI 2b reperfusion.gastritis sig echo was unremarkable. Transesophageal echo showed no Source of embolism. LDL cholesterol 81 mg percent. Hemoglobin A1c was 11.5. She was on aspirin prior to admission which was changed to Plavix. Patient did well and was subsequently discharged home with home physical occupational speech therapy. Patient subsequently had symptomatic bradycardia and was seen by Dr. Elberta Fortisamnitz electrophysiologist and underwent pacemaker insertion with removal of the loop recorder on 01/02/18. She has not yet found A. Fib. The patient's Plavix and Lipitor have been inadvertently discontinued following the admission for pacemaker. She states her sugars under good control. She is still on Keppra which was started when she went back on 12/13/17 to the ER with a witnessed seizure. She's had no further breakthrough seizures. She still has residual aphasia but it is improving. Her home  speech therapy is recommending outpatient speech therapy now. Physical and occupational therapy have discharged her. She is still out of work and wants FMLA paperwork filled out.  Update 04/15/2018: Patient is being seen today for follow-up and is accompanied by her two sons.  She continues to have expressive aphasia > receptive aphasia but she continues to participate in speech therapy at neuro rehab clinic.  She states some days are better than others but overall has been improving.  She does notice increased difficulty with expressive aphasia with increased stress or fatigue.  She also has been having difficulty with processing information.  She continues to take aspirin and Plavix without side effects of bleeding or bruising.  Continues to take Lipitor without side effects myalgias.  Blood pressure today satisfactory 125/70.  Patient does not monitor this at home and this was highly recommended.  She does monitor glucose levels at home which have been fluctuating but she does have an appointment scheduled on 05/04/2018 for her first visit with endocrinologist.  She continues to take Keppra 500 mg twice daily without recent seizure activity.  Patient has not returned to work at this time working at [employer] due to continued global aphasia.  Patient deals greatly with the public and on the phone with collections department and patient continues to have difficulty with expressive aphasia especially under stressful situations.  She is currently living at home with her husband, son and father who has Alzheimer's.  Patient states she was told by her cardiologist that she can start driving at this time but educated patient on [place] law stating that she is unable to drive for 6 months after seizure  activity.  Patient verbalized understanding.  Patient has been going through stressful situation at home with her son, daughter-in-law and her children as far as divorce and custody battle.  Since this is  been occurring, patient has had increase in fluctuation with her expressive aphasia.  Her receptive aphasia continues to improve but does misunderstand words at times and continues to have issues with processing information.  Denies new or worsening stroke/TIA symptoms.  Update 11/02/2018 : She returns for follow-up after last visit 6 months ago.  She is accompanied by her son-in-law.  Patient has noticed worsening of her hand and voice tremor since she was taken off the primidone following her stroke.  The tremors are present at rest and do increase with action and do interfere with her day-to-day activities.  She was started recently back on primidone 2 weeks ago by primary physician and took 125 mg for 2 weeks.  She increase it 250 mg for couple of days but ran out of it and has not been taking the medicine for the last for 5 days.  She has not had any recurrent stroke or TIA symptoms.  She remains on aspirin and Plavix and tolerating it well without bleeding or bruising.  Her blood pressure is well controlled today it is 129/73.  She states her sugars work controlled and the last A1c was below 7.  She is tolerating Lipitor well without muscle aches and pains and last lipid profile checked in December last year was satisfactory.  She continues to have mild residual aphasia from the stroke but is able to communicate if she speaks slowly and deliberately.  Update 02/09/2019: Follow-up visit today for stroke and essential tremors.  She is currently on primidone 250 mg twice daily with overall improvement of her tremors.  She is now able to use utensils to feed herself without difficulty and no longer interfere with her day-to-day activity.  She is tolerating primidone without side effects.  Residual stroke deficit of expressive aphasia which has recently worsened with increased stress due to recent loss of her father and increase anxiety and worrying over pandemic and protesting.  She continues on Zoloft 200 mg  daily which was previously stable.  Her anxiety is worse at night when there is not as much going on and is unable to leave her house due to pandemic.  She becomes tearful speaking about this.  She has nonfluent speech with frequent hesitancy as she states she knows what words she wants to say but unable to get it out.  She denies any other neurological worsening.  She currently lives with her husband, son and grandchild but is able to maintain all ADLs and IADLs independently.  She continues on Keppra without seizure activity or side effects.  Continues on Plavix and atorvastatin without side effects.  Blood pressure not routinely monitored at home.  Glucose levels have been stable and continues to follow with endocrinology.  Repeat carotid duplex and transcranial Doppler on 11/12/2018 unremarkable.  No further concerns at this time.  03/11/19 visit: Brittany [name] is a 58 year old female who is being seen today for follow up and accompanied by her son.  Discussion at prior visit regarding ongoing hearing difficulties poststroke and increased anxiety causing worsening expressive aphasia.  She did have evaluation by ENT where it was felt as though hearing difficulties are more related to communication deficit with processing of information. Anxiety has improved with use of ativan 0.5mg . she has used approx 4 times over  the past month without reported side effects.  She also endorses overall improvement of her speech with at times waxing/waning but overall large improvement.  She also continues on sertraline 200 mg daily. Tremors has been stable with ongoing use of primidone 250 mg twice daily- able to do more ADLs with eating and writing Keppra 500 mg twice daily without recurrent seizure activity Continues on clopidogrel and atorvastatin without reported side effects for secondary stroke prevention Blood pressure today satisfactory 124/75 Glucose levels have been stable No further concerns at this  time     ROS:   14 system review of systems is positive for speech difficulty, anxiety and tremor and all other systems negative   PMH:  Past Medical History:  Diagnosis Date   Chronic lower back pain    Depression    Fibromyalgia    High cholesterol    Seizures (HCC) 11/2017   "day after she came home from hospital after having stroke" (01/01/2018)   Stroke (HCC) 12/06/2017   "speech issues since" (01/01/2018)   Type II diabetes mellitus (HCC)     Social History:  Social History   Socioeconomic History   Marital status: Married    Spouse name: [name]   Number of children: 2   Years of education: 14   Highest education level: Not on file  Occupational History   Not on file  Social Needs   Financial resource strain: Not on file   Food insecurity    Worry: Not on file    Inability: Not on file   Transportation needs    Medical: Not on file    Non-medical: Not on file  Tobacco Use   Smoking status: Former Smoker    Packs/day: 1.00    Years: 38.00    Pack years: 38.00    Types: Cigarettes    Quit date: 12/06/2017    Years since quitting: 1.2   Smokeless tobacco: Never Used  Substance and Sexual Activity   Alcohol use: Yes    Comment: 01/01/2018 "nothing since stroke 11/2017"   Drug use: No   Sexual activity: Not Currently  Lifestyle   Physical activity    Days per week: Not on file    Minutes per session: Not on file   Stress: Not on file  Relationships   Social connections    Talks on phone: Not on file    Gets together: Not on file    Attends religious service: Not on file    Active member of club or organization: Not on file    Attends meetings of clubs or organizations: Not on file    Relationship status: Not on file   Intimate partner violence    Fear of current or ex partner: Not on file    Emotionally abused: Not on file    Physically abused: Not on file    Forced sexual activity: Not on file  Other Topics Concern   Not  on file  Social History Narrative   Patient is married ([name]) and lives at home with her family   Patient has two children.   Patient works at [employer].   Patient has a college education.   Patient is right-handed.   Patient drinks 3 cups of coffee M-F.       Medications:   Current Outpatient Medications on File Prior to Visit  Medication Sig Dispense Refill   acetaminophen (TYLENOL) 500 MG tablet Take 1,000 mg by mouth every 6 (six) hours as  needed for fever (pain).     atorvastatin (LIPITOR) 40 MG tablet Take 1 tablet (40 mg total) by mouth daily. 90 tablet 1   B Complex-Biotin-FA (MULTI-B COMPLEX) CAPS Take one a day ,B complex  multivitamin over the counter or use prenatal vitamin. 90 capsule 0   clopidogrel (PLAVIX) 75 MG tablet Take 1 tablet (75 mg total) by mouth daily. 90 tablet 3   insulin aspart (NOVOLOG FLEXPEN) 100 UNIT/ML FlexPen 3 times a day (just before each meal), 05-23-12, and pen needles 4/day 15 mL 11   Insulin Detemir (LEVEMIR FLEXTOUCH) 100 UNIT/ML Pen Inject 20 Units into the skin at bedtime. 15 mL    Insulin Pen Needle (BD PEN NEEDLE NANO U/F) 32G X 4 MM MISC Use to check blood sugars daily at bedtime 90 each 0   irbesartan (AVAPRO) 150 MG tablet TAKE 1 TABLET(150 MG) BY MOUTH DAILY 90 tablet 3   levETIRAcetam (KEPPRA) 500 MG tablet Take 1 tablet (500 mg total) by mouth 2 (two) times daily. 180 tablet 4   LORazepam (ATIVAN) 0.5 MG tablet Take 1 tablet (0.5 mg total) by mouth 2 (two) times daily. 30 tablet 0   primidone (MYSOLINE) 250 MG tablet Take 1 tablet (250 mg total) by mouth 2 (two) times daily. Start 1 tablet at bedtime for 2 weeks and increase if tolerated to 1 tablet twice daily 60 tablet 3   sertraline (ZOLOFT) 100 MG tablet Take 2 tablets (200 mg total) by mouth at bedtime. 180 tablet 3   No current facility-administered medications on file prior to visit.     Allergies:  No Known Allergies  GAD 7 : Generalized Anxiety Score  03/11/2019 02/09/2019  Nervous, Anxious, on Edge 1 2  Control/stop worrying 2 2  Worry too much - different things 2 3  Trouble relaxing 0 3  Restless 0 3  Easily annoyed or irritable 1 2  Afraid - awful might happen 1 3  Total GAD 7 Score 7 18  Anxiety Difficulty Somewhat difficult Very difficult    General: well developed, well nourished,  pleasant middle-age Caucasian female, seated, in no evident distress Head: head normocephalic and atraumatic.   Neck: supple with no carotid or supraclavicular bruits Cardiovascular: regular rate and rhythm, no murmurs Musculoskeletal: no deformity Skin:  no rash/petichiae Vascular:  Normal pulses all extremities   Neurologic Exam Mental Status: Awake and fully alert.   Mild to moderate expressive aphasia.  Oriented to place and time. Recent and remote memory intact. Attention span, concentration and fund of knowledge appropriate. Mood and affect appropriate.  Cranial Nerves: Pupils equal, briskly reactive to light. Extraocular movements full without nystagmus. Visual fields full to confrontation. Hearing intact. Facial sensation intact. Face, tongue, palate moves normally and symmetrically.  Motor: Normal bulk and tone. Normal strength in all tested extremity muscles. Sensory.: intact to touch , pinprick , position and vibratory sensation.  Coordination: Essential tremors noted in bilateral upper extremities. rapid alternating movements normal in all extremities. Finger-to-nose and heel-to-shin performed accurately bilaterally. Gait and Station: Arises from chair without difficulty. Stance is normal. Gait demonstrates normal stride length and balance Reflexes: 1+ and symmetric. Toes downgoing.     IMAGING:  VAS US CAROTID DUPLEX BILATERAL 11/12/2018 Summary: Right Carotid: Velocities in the right ICA are consistent with a 1-39% stenosis. Left Carotid: Velocities in the left ICA are consistent with a 1-39% stenosis. Vertebrals:  Bilateral  vertebral arteries demonstrate antegrade flow. Subclavians: Normal flow hemodynamics were seen in bilateral subclavian  arteries.  VAS [place] TRANSCRANIAL DOPPLER 11/12/2018 Summary: Absent bitemporal windows limits evaluation of anterior circulation . Normal mean flow velocities in both opthalmics, left carotid siphon and posterior circulation vessels.    ASSESSMENT: 50-48 year old lady with aphasia secondary to left MCA infarct in April 2019 of cryptogenic etiology treated with IV TPA followed by mechanical thrombectomy with good recanalization. She also had symptomatic seizure after discharge and residual stroke deficit of expressive aphasia.  Restarted on primidone due to essential tremors which has been stable.  Largely benefited from use of lorazepam as needed for increased anxiety which led to worsening of expressive aphasia.    PLAN: -Continue Ativan 0.25 mg - 0.5 mg twice daily as needed for situational anxiety and increased stress with worsening neurological symptoms.  Large improvement in GAD 7 scoring from 18 at prior visit and 7 at today's visit.  Long discussion regarding potential need of referral to psychology due to ongoing multiple life and family stressors.  Discussion regarding use of lorazepam not being a long-term medication and was initially prescribed due to current stressors with recent passing of her father and pandemic.  She declines referral to psychology at this time but will consider and call office if interested -Continue primidone 250 mg twice daily for essential tremors -Continue Keppra 500 mg twice daily for seizure prophylaxis -Continue Plavix and atorvastatin for secondary stroke prevention -Continue to follow with PCP for HTN, HLD and DM management -Continue strict control of hypertension with blood pressure goal below 130/90, lipids with LDL cholesterol goal below 70 mg percent and diabetes with hemoglobin A1c goal below 7.  She was advised to eat a healthy diet with  lots of fruits, vegetables, cereals and whole grains and to be active and exercise 30 minutes every day.    Follow-up in 3 months or call earlier if needed  Greater than 50% of time during this 45 minute visit was spent on discussing increased stress/anxiety with worsening of neurological symptoms with ongoing stressors, residual expressive aphasia, essential tremors and importance of stroke risk factor management, planning of further management, discussion with patient and family and coordination of care  George Hugh, AGNP-BC  [facility] [address] [place], [place] [address]  Phone [phone] Fax [phone] Note: This document was prepared with digital dictation and possible smart phrase technology. Any transcriptional errors that result from this process are unintentional.

## 2019-03-12 NOTE — Progress Notes (Signed)
I agree with the above plan 

## 2019-03-17 ENCOUNTER — Ambulatory Visit: Payer: Self-pay | Admitting: Adult Health

## 2019-03-25 ENCOUNTER — Other Ambulatory Visit: Payer: Self-pay | Admitting: Family Medicine

## 2019-03-25 DIAGNOSIS — Z1231 Encounter for screening mammogram for malignant neoplasm of breast: Secondary | ICD-10-CM

## 2019-04-05 ENCOUNTER — Ambulatory Visit (INDEPENDENT_AMBULATORY_CARE_PROVIDER_SITE_OTHER): Payer: BC Managed Care – PPO | Admitting: *Deleted

## 2019-04-05 DIAGNOSIS — I495 Sick sinus syndrome: Secondary | ICD-10-CM

## 2019-04-05 LAB — CUP PACEART REMOTE DEVICE CHECK
Battery Remaining Longevity: 169 mo
Battery Voltage: 3.06 V
Brady Statistic AP VP Percent: 2.39 %
Brady Statistic AP VS Percent: 0.77 %
Brady Statistic AS VP Percent: 2.44 %
Brady Statistic AS VS Percent: 94.4 %
Brady Statistic RA Percent Paced: 3.29 %
Brady Statistic RV Percent Paced: 4.83 %
Date Time Interrogation Session: 20200727044155
Implantable Lead Implant Date: 20190425
Implantable Lead Implant Date: 20190425
Implantable Lead Location: 753859
Implantable Lead Location: 753860
Implantable Lead Model: 5076
Implantable Lead Model: 5076
Implantable Pulse Generator Implant Date: 20190425
Lead Channel Impedance Value: 361 Ohm
Lead Channel Impedance Value: 456 Ohm
Lead Channel Impedance Value: 532 Ohm
Lead Channel Impedance Value: 570 Ohm
Lead Channel Pacing Threshold Amplitude: 0.625 V
Lead Channel Pacing Threshold Amplitude: 1.125 V
Lead Channel Pacing Threshold Pulse Width: 0.4 ms
Lead Channel Pacing Threshold Pulse Width: 0.4 ms
Lead Channel Sensing Intrinsic Amplitude: 0.875 mV
Lead Channel Sensing Intrinsic Amplitude: 0.875 mV
Lead Channel Sensing Intrinsic Amplitude: 22.625 mV
Lead Channel Sensing Intrinsic Amplitude: 22.625 mV
Lead Channel Setting Pacing Amplitude: 2.25 V
Lead Channel Setting Pacing Amplitude: 2.5 V
Lead Channel Setting Pacing Pulse Width: 0.4 ms
Lead Channel Setting Sensing Sensitivity: 2 mV

## 2019-04-23 ENCOUNTER — Encounter: Payer: Self-pay | Admitting: Cardiology

## 2019-04-23 NOTE — Progress Notes (Signed)
Remote pacemaker transmission.   

## 2019-04-28 LAB — HM DIABETES EYE EXAM

## 2019-04-30 ENCOUNTER — Other Ambulatory Visit: Payer: Self-pay

## 2019-04-30 ENCOUNTER — Telehealth (INDEPENDENT_AMBULATORY_CARE_PROVIDER_SITE_OTHER): Payer: BC Managed Care – PPO | Admitting: Family Medicine

## 2019-04-30 DIAGNOSIS — E785 Hyperlipidemia, unspecified: Secondary | ICD-10-CM

## 2019-04-30 DIAGNOSIS — I10 Essential (primary) hypertension: Secondary | ICD-10-CM | POA: Diagnosis not present

## 2019-04-30 DIAGNOSIS — Z794 Long term (current) use of insulin: Secondary | ICD-10-CM

## 2019-04-30 DIAGNOSIS — R4701 Aphasia: Secondary | ICD-10-CM

## 2019-04-30 DIAGNOSIS — I6930 Unspecified sequelae of cerebral infarction: Secondary | ICD-10-CM | POA: Diagnosis not present

## 2019-04-30 DIAGNOSIS — F331 Major depressive disorder, recurrent, moderate: Secondary | ICD-10-CM | POA: Diagnosis not present

## 2019-04-30 DIAGNOSIS — E1149 Type 2 diabetes mellitus with other diabetic neurological complication: Secondary | ICD-10-CM | POA: Diagnosis not present

## 2019-04-30 DIAGNOSIS — R300 Dysuria: Secondary | ICD-10-CM

## 2019-04-30 DIAGNOSIS — I639 Cerebral infarction, unspecified: Secondary | ICD-10-CM

## 2019-04-30 MED ORDER — SERTRALINE HCL 100 MG PO TABS: 200.0000 mg | ORAL_TABLET | Freq: Every day | ORAL | 3 refills | Status: DC

## 2019-04-30 NOTE — Progress Notes (Signed)
Virtual Visit Note  I connected with patient on 04/30/19 at 340pm by phone due to Internet instability and verified that I am speaking with the correct person using two identifiers. Brittany [name] is currently located at home and patient is currently with them during visit. The provider, Rutherford Guys, MD is located in their office at time of visit.  I discussed the limitations, risks, security and privacy concerns of performing an evaluation and management service by telephone and the availability of in person appointments. I also discussed with the patient that there may be a patient responsible charge related to this service. The patient expressed understanding and agreed to proceed.   CC: TOC  HPI ? 58 YO F with PMH embolic stroke with dysphagia, sick sinus syndrome, DM2 on insulin, essential tremors, depression for TOC  Previous PCP Dr Brigitte Pulse Last [place] Dec 2019 Patient with aphasia since stroke Her husband helps with history Needs refills of sertraline She has been on sertraline for years Depression well controlled  Also wondering if she has a UTI as having burning and urgency for about the past week No fever or chills, nausea or vomiting or flank pain  Patient Care Team: Rutherford Guys, MD as PCP - General (Family Medicine) Yisroel Ramming, Everardo All, MD as Consulting Physician (Obstetrics and Gynecology) Newman Pies, MD as Consulting Physician (Neurosurgery) Claris Gower, NP as Nurse Practitioner (Neurology) Renato Shin, MD as Consulting Physician (Endocrinology) Garvin Fila, MD as Consulting Physician (Neurology) Constance Haw, MD as Consulting Physician (Cardiology)   No Known Allergies  Prior to Admission medications   Medication Sig Start Date End Date Taking? Authorizing Provider  acetaminophen (TYLENOL) 500 MG tablet Take 1,000 mg by mouth every 6 (six) hours as needed for fever (pain).    [provider]  atorvastatin (LIPITOR) 40  MG tablet Take 1 tablet (40 mg total) by mouth daily. 09/03/18   Shawnee Knapp, MD  B Complex-Biotin-FA (MULTI-B COMPLEX) CAPS Take one a day ,B complex  multivitamin over the counter or use prenatal vitamin. 08/28/16   Dohmeier, Asencion Partridge, MD  clopidogrel (PLAVIX) 75 MG tablet Take 1 tablet (75 mg total) by mouth daily. 09/03/18   Shawnee Knapp, MD  insulin aspart (NOVOLOG FLEXPEN) 100 UNIT/ML FlexPen 3 times a day (just before each meal), 05-23-12, and pen needles 4/day 03/10/19   Renato Shin, MD  Insulin Detemir (LEVEMIR FLEXTOUCH) 100 UNIT/ML Pen Inject 20 Units into the skin at bedtime. 03/10/19   Renato Shin, MD  Insulin Pen Needle (BD PEN NEEDLE NANO U/F) 32G X 4 MM MISC Use to check blood sugars daily at bedtime 02/09/19   Renato Shin, MD  irbesartan (AVAPRO) 150 MG tablet TAKE 1 TABLET(150 MG) BY MOUTH DAILY 09/03/18   Shawnee Knapp, MD  levETIRAcetam (KEPPRA) 500 MG tablet Take 1 tablet (500 mg total) by mouth 2 (two) times daily. 02/09/19   Claris Gower, NP  LORazepam (ATIVAN) 0.5 MG tablet Take 0.5-1 tablets (0.25-0.5 mg total) by mouth 2 (two) times daily as needed for anxiety. 03/11/19   Claris Gower, NP  primidone (MYSOLINE) 250 MG tablet Take 1 tablet (250 mg total) by mouth 2 (two) times daily. Start 1 tablet at bedtime for 2 weeks and increase if tolerated to 1 tablet twice daily 02/09/19   Claris Gower, NP  sertraline (ZOLOFT) 100 MG tablet Take 2 tablets (200 mg total) by mouth at bedtime. 02/26/18   Shawnee Knapp, MD  Past Medical History:  Diagnosis Date  . Chronic lower back pain   . Depression   . Fibromyalgia   . High cholesterol   . Seizures ([place]) 11/2017   "day after she came home from hospital after having stroke" (01/01/2018)  . Stroke [facility]) 12/06/2017   "speech issues since" (01/01/2018)  . Type II diabetes mellitus ([place])     Past Surgical History:  Procedure Laterality Date  . BACK SURGERY    . CHOLECYSTECTOMY    . INSERT / REPLACE / REMOVE PACEMAKER  01/01/2018  .  IR CT HEAD LTD  12/06/2017  . IR PERCUTANEOUS ART THROMBECTOMY/INFUSION INTRACRANIAL INC DIAG ANGIO  12/06/2017  . LOOP RECORDER INSERTION N/A 12/12/2017   Procedure: LOOP RECORDER INSERTION;  Surgeon: Constance Haw, MD;  Location: [place] CV [facility];  Service: Cardiovascular;  Laterality: N/A;  . LOOP RECORDER REMOVAL  01/01/2018  . LOOP RECORDER REMOVAL N/A 01/01/2018   Procedure: LOOP RECORDER REMOVAL;  Surgeon: Constance Haw, MD;  Location: [place] [facility];  Service: Cardiovascular;  Laterality: N/A;  . PACEMAKER IMPLANT N/A 01/01/2018   Procedure: PACEMAKER IMPLANT;  Surgeon: Constance Haw, MD;  Location: [place] [facility];  Service: Cardiovascular;  Laterality: N/A;  . RADIOLOGY WITH ANESTHESIA N/A 12/06/2017   Procedure: CODE STROKE;  Surgeon: Luanne Bras, MD;  Location: [place];  Service: Radiology;  Laterality: N/A;  . TEE WITHOUT CARDIOVERSION N/A 12/10/2017   Procedure: TRANSESOPHAGEAL ECHOCARDIOGRAM (TEE);  Surgeon: Jerline Pain, MD;  Location: [facility] ENDOSCOPY;  Service: Cardiovascular;  Laterality: N/A;  . THROMBECTOMY FEMORAL ARTERY Right 12/07/2017   Procedure: THROMBECTOMY RIGHT FEMORAL ARTERY;  Surgeon: Waynetta Sandy, MD;  Location: [place];  Service: Vascular;  Laterality: Right;  . TONSILLECTOMY      Social History   Tobacco Use  . Smoking status: Former Smoker    Packs/day: 1.00    Years: 38.00    Pack years: 38.00    Types: Cigarettes    Quit date: 12/06/2017    Years since quitting: 1.3  . Smokeless tobacco: Never Used  Substance Use Topics  . Alcohol use: Yes    Comment: 01/01/2018 "nothing since stroke 11/2017"    Family History  Problem Relation Age of Onset  . Cancer Mother   . Diabetes Mother   . Heart disease Mother   . Parkinsonism Mother   . Diabetes Father   . Heart disease Father   . Dementia Father   . Cancer Maternal Grandmother 35       lung  . COPD Maternal Aunt     Review of Systems  Constitutional:  Negative for chills and fever.  Respiratory: Negative for cough and shortness of breath.   Cardiovascular: Negative for chest pain, palpitations and leg swelling.  Gastrointestinal: Negative for abdominal pain, nausea and vomiting.  Genitourinary: Positive for dysuria and urgency.    Objective  Vitals as reported by the patient:   BP Readings from Last 3 Encounters:  03/11/19 124/75  03/10/19 124/70  01/14/19 (!) 144/67    ASSESSMENT and PLAN  1. Moderate episode of recurrent major depressive disorder (HCC) Restarting sertraline.   2. Type 2 diabetes mellitus with other neurologic complication, with long-term current use of insulin ([place]) Managed by endo - CMP14+EGFR - Lipid panel  3. 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 cerebrovascular accident (CVA) ([place]) Followed by ep and neuro. Good support at home  4. Essential hypertension, benign Controlled. Continue current regime.   5. Hyperlipidemia LDL  goal <70 Checking labs today, medications will be adjusted as needed.   6. Dysuria Labs pending, will start abx if appropriate  FOLLOW-UP: 3 months   The above assessment and management plan was discussed with the patient. The patient verbalized understanding of and has agreed to the management plan. Patient is aware to call the clinic if symptoms persist or worsen. Patient is aware when to return to the clinic for a follow-up visit. Patient educated on when it is appropriate to go to the emergency department.    I provided 17 minutes of non-face-to-face time during this encounter.  Rutherford Guys, MD [facility] at [place], [place] [address] Ph.  [phone] Fax [phone]

## 2019-05-02 ENCOUNTER — Other Ambulatory Visit: Payer: Self-pay | Admitting: Endocrinology

## 2019-05-11 ENCOUNTER — Other Ambulatory Visit: Payer: Self-pay

## 2019-05-11 ENCOUNTER — Ambulatory Visit (INDEPENDENT_AMBULATORY_CARE_PROVIDER_SITE_OTHER): Payer: BC Managed Care – PPO | Admitting: Endocrinology

## 2019-05-11 ENCOUNTER — Encounter: Payer: Self-pay | Admitting: Endocrinology

## 2019-05-11 VITALS — BP 144/82 | HR 105 | Ht 64.5 in | Wt 169.4 lb

## 2019-05-11 DIAGNOSIS — E1149 Type 2 diabetes mellitus with other diabetic neurological complication: Secondary | ICD-10-CM

## 2019-05-11 DIAGNOSIS — Z794 Long term (current) use of insulin: Secondary | ICD-10-CM

## 2019-05-11 LAB — POCT GLYCOSYLATED HEMOGLOBIN (HGB A1C): Hemoglobin A1C: 7.3 % — AB (ref 4.0–5.6)

## 2019-05-11 LAB — GLUCOSE, POCT (MANUAL RESULT ENTRY): POC Glucose: 235 mg/dl — AB (ref 70–99)

## 2019-05-11 MED ORDER — NOVOLOG FLEXPEN 100 UNIT/ML ~~LOC~~ SOPN: PEN_INJECTOR | SUBCUTANEOUS | 11 refills | Status: DC

## 2019-05-11 MED ORDER — LEVEMIR FLEXTOUCH 100 UNIT/ML ~~LOC~~ SOPN: 22.0000 [IU] | PEN_INJECTOR | Freq: Every day | SUBCUTANEOUS | 1 refills | Status: DC

## 2019-05-11 NOTE — Progress Notes (Signed)
Subjective:    Patient ID: Brittany [name], female    DOB: May 31, 1961, 58 y.o.   MRN: [id number]  HPI Pt returns for f/u of diabetes mellitus: DM type: Insulin-requiring type 2 Dx'ed: 1999 Complications: CVA Therapy: insulin since early 2019 GDM: 1987 DKA: never Severe hypoglycemia: never Pancreatitis: never Pancreatic imaging: normal on 2019 CT.   Other: son gives pt her insulin (due to CVA--he says he knows how to use syringe and vial); she gets multiple daily injections.   Interval history: she is making slow progress since CVA. she brings a record of her cbg's which I have reviewed today.  cbg varies from 56-387.  There is no trend throughout the day.  She never misses the insulin.  She has mild hypoglycemia approx once per week.  She is unable to cite a cause, but is does not happen in the fasting state. Past Medical History:  Diagnosis Date  . Chronic lower back pain   . Depression   . Fibromyalgia   . High cholesterol   . Seizures (HCC) 11/2017   "day after she came home from hospital after having stroke" (01/01/2018)  . Stroke [facility](HCC) 12/06/2017   "speech issues since" (01/01/2018)  . Type II diabetes mellitus (HCC)     Past Surgical History:  Procedure Laterality Date  . BACK SURGERY    . CHOLECYSTECTOMY    . INSERT / REPLACE / REMOVE PACEMAKER  01/01/2018  . IR CT HEAD LTD  12/06/2017  . IR PERCUTANEOUS ART THROMBECTOMY/INFUSION INTRACRANIAL INC DIAG ANGIO  12/06/2017  . LOOP RECORDER INSERTION N/A 12/12/2017   Procedure: LOOP RECORDER INSERTION;  Surgeon: Regan Lemmingamnitz, Will Martin, MD;  Location: [facility];  Service: Cardiovascular;  Laterality: N/A;  . LOOP RECORDER REMOVAL  01/01/2018  . LOOP RECORDER REMOVAL N/A 01/01/2018   Procedure: LOOP RECORDER REMOVAL;  Surgeon: Regan Lemmingamnitz, Will Martin, MD;  Location: [facility];  Service: Cardiovascular;  Laterality: N/A;  . PACEMAKER IMPLANT N/A 01/01/2018   Procedure: PACEMAKER IMPLANT;  Surgeon: Regan Lemmingamnitz, Will  Martin, MD;  Location: [facility];  Service: Cardiovascular;  Laterality: N/A;  . RADIOLOGY WITH ANESTHESIA N/A 12/06/2017   Procedure: CODE STROKE;  Surgeon: Julieanne Cottoneveshwar, Sanjeev, MD;  Location: [facility] OR;  Service: Radiology;  Laterality: N/A;  . TEE WITHOUT CARDIOVERSION N/A 12/10/2017   Procedure: TRANSESOPHAGEAL ECHOCARDIOGRAM (TEE);  Surgeon: Jake BatheSkains, Mark C, MD;  [facility];  Service: Cardiovascular;  Laterality: N/A;  . THROMBECTOMY FEMORAL ARTERY Right 12/07/2017   Procedure: THROMBECTOMY RIGHT FEMORAL ARTERY;  Surgeon: Maeola Harmanain, Brandon Christopher, MD;  [facility];  Service: Vascular;  Laterality: Right;  . TONSILLECTOMY      Social History   Socioeconomic History  . Marital status: Married    Spouse name: Jonny RuizJohn  . Number of children: 2  . Years of education: 5014  . Highest education level: Not on file  Occupational History  . Not on file  Social Needs  . Financial resource strain: Not on file  . Food insecurity    Worry: Not on file    Inability: Not on file  . Transportation needs    Medical: Not on file    Non-medical: Not on file  Tobacco Use  . Smoking status: Former Smoker    Packs/day: 1.00    Years: 38.00    Pack years: 38.00    Types: Cigarettes    Quit date: 12/06/2017    Years since quitting: 1.4  . Smokeless tobacco: Never  Used  Substance and Sexual Activity  . Alcohol use: Yes    Comment: 01/01/2018 "nothing since stroke 11/2017"  . Drug use: No  . Sexual activity: Not Currently  Lifestyle  . Physical activity    Days per week: Not on file    Minutes per session: Not on file  . Stress: Not on file  Relationships  . Social Musician on phone: Not on file    Gets together: Not on file    Attends religious service: Not on file    Active member of club or organization: Not on file    Attends meetings of clubs or organizations: Not on file    Relationship status: Not on file  . Intimate partner violence    Fear of current or  ex partner: Not on file    Emotionally abused: Not on file    Physically abused: Not on file    Forced sexual activity: Not on file  Other Topics Concern  . Not on file  Social History Narrative   Patient is married ([name]) and lives at home with her family   Patient has two children.   Patient works at [employer].   Patient has a college education.   Patient is right-handed.   Patient drinks 3 cups of coffee M-F.       Current Outpatient Medications on File Prior to Visit  Medication Sig Dispense Refill  . acetaminophen (TYLENOL) 500 MG tablet Take 1,000 mg by mouth every 6 (six) hours as needed for fever (pain).    Marland Kitchen atorvastatin (LIPITOR) 40 MG tablet Take 1 tablet (40 mg total) by mouth daily. 90 tablet 1  . B Complex-Biotin-FA (MULTI-B COMPLEX) CAPS Take one a day ,B complex  multivitamin over the counter or use prenatal vitamin. 90 capsule 0  . clopidogrel (PLAVIX) 75 MG tablet Take 1 tablet (75 mg total) by mouth daily. 90 tablet 3  . Insulin Pen Needle (BD PEN NEEDLE NANO U/F) 32G X 4 MM MISC Use to check blood sugars daily at bedtime 90 each 0  . irbesartan (AVAPRO) 150 MG tablet TAKE 1 TABLET(150 MG) BY MOUTH DAILY 90 tablet 3  . levETIRAcetam (KEPPRA) 500 MG tablet Take 1 tablet (500 mg total) by mouth 2 (two) times daily. 180 tablet 4  . LORazepam (ATIVAN) 0.5 MG tablet Take 0.5-1 tablets (0.25-0.5 mg total) by mouth 2 (two) times daily as needed for anxiety. 45 tablet 0  . primidone (MYSOLINE) 250 MG tablet Take 1 tablet (250 mg total) by mouth 2 (two) times daily. Start 1 tablet at bedtime for 2 weeks and increase if tolerated to 1 tablet twice daily 60 tablet 3  . sertraline (ZOLOFT) 100 MG tablet Take 2 tablets (200 mg total) by mouth at bedtime. 180 tablet 3   No current facility-administered medications on file prior to visit.     No Known Allergies  Family History  Problem Relation Age of Onset  . Cancer Mother   . Diabetes Mother   . Heart disease  Mother   . Parkinsonism Mother   . Diabetes Father   . Heart disease Father   . Dementia Father   . Cancer Maternal Grandmother 62       lung  . COPD Maternal Aunt     BP (!) 144/82 (BP Location: Right Arm, Patient Position: Sitting, Cuff Size: Normal)   Pulse (!) 105   Ht 5' 4.5" (1.638 m)   Wt 169  lb 6.4 oz (76.8 kg)   SpO2 95%   BMI 28.63 kg/m    Review of Systems Pt says tremor is chronic.      Objective:   Physical Exam VITAL SIGNS:  See vs page GENERAL: no distress Pulses: dorsalis pedis intact bilat.   MSK: no deformity of the feet CV: no leg edema.  Skin:  no ulcer on the feet.  normal color and temp on the feet.  Neuro: sensation is intact to touch on the feet.  Generalized fine tremor.    Lab Results  Component Value Date   HGBA1C 7.3 (A) 05/11/2019        Assessment & Plan:  Insulin-requiring type 2 DM, with CVA: The pattern of his cbg's indicates she needs some adjustment in his therapy Tremor, new to me.  Not related to glucose.   HTN: is noted today Hypoglycemia: this limits aggressiveness of glycemic control   Patient Instructions  Your blood pressure is high today.  Please see your primary care provider soon, to have it rechecked'check your blood sugar 4 times a day: before the 3 meals, and at bedtime.  also check if you have symptoms of your blood sugar being too high or too low.  please keep a record of the readings and bring it to your next appointment here (or you can bring the meter itself).  You can write it on any piece of paper.  please call us sooner if your blood sugar goes below 70, or if you have a lot of readings over 200.   Please change the insulins to the numbers listed below.   Please come back for a follow-up appointment in 2 months.

## 2019-05-11 NOTE — Patient Instructions (Addendum)
Your blood pressure is high today.  Please see your primary care provider soon, to have it rechecked'check your blood sugar 4 times a day: before the 3 meals, and at bedtime.  also check if you have symptoms of your blood sugar being too high or too low.  please keep a record of the readings and bring it to your next appointment here (or you can bring the meter itself).  You can write it on any piece of paper.  please call us sooner if your blood sugar goes below 70, or if you have a lot of readings over 200.   Please change the insulins to the numbers listed below.   Please come back for a follow-up appointment in 2 months.

## 2019-05-12 ENCOUNTER — Ambulatory Visit: Payer: BC Managed Care – PPO

## 2019-05-14 ENCOUNTER — Telehealth: Payer: Self-pay | Admitting: Family Medicine

## 2019-05-14 NOTE — Telephone Encounter (Signed)
Pt called and is requesting to have her tremor medication refilled. Pts husband states she has been out of medication for a week and her tremors are bad. Please advise.    [id number] - [address], [address]  [address] [place] [address]  Phone: [phone] Fax: [phone]  Open 24 hours

## 2019-05-14 NOTE — Telephone Encounter (Signed)
Patient states that she takes tramadol 250mg  for her tremors and has been out for a week. Medication not on current medication list

## 2019-05-18 NOTE — Telephone Encounter (Signed)
Primidione, not tramadol, prescribed by neurology, please have patient reach out to them for refills. thanks

## 2019-05-18 NOTE — Telephone Encounter (Signed)
Pt states she was recently on Tramadol for tremors and was wondering could you send her a Rx or should she make an appointment.

## 2019-05-19 ENCOUNTER — Telehealth: Payer: Self-pay | Admitting: Adult Health

## 2019-05-19 MED ORDER — PRIMIDONE 250 MG PO TABS: 250.0000 mg | ORAL_TABLET | Freq: Two times a day (BID) | ORAL | 3 refills | Status: DC

## 2019-05-19 NOTE — Telephone Encounter (Signed)
Pt is needing a refill on her primidone (MYSOLINE) 250 MG tablet sent to the [facility] on [address].

## 2019-05-21 NOTE — Telephone Encounter (Signed)
Called the pt and she stated that she got it filled after I have informed her that she has to get those medication from the provider

## 2019-06-21 ENCOUNTER — Ambulatory Visit
Admission: RE | Admit: 2019-06-21 | Discharge: 2019-06-21 | Disposition: A | Discharge status: Home / Self Care | Payer: BC Managed Care – PPO | Source: Ambulatory Visit | Attending: Family Medicine | Admitting: Family Medicine

## 2019-06-21 ENCOUNTER — Other Ambulatory Visit: Payer: Self-pay

## 2019-06-21 DIAGNOSIS — Z1231 Encounter for screening mammogram for malignant neoplasm of breast: Secondary | ICD-10-CM

## 2019-06-24 ENCOUNTER — Other Ambulatory Visit: Payer: Self-pay

## 2019-06-24 ENCOUNTER — Ambulatory Visit: Payer: BC Managed Care – PPO | Admitting: Adult Health

## 2019-06-24 ENCOUNTER — Ambulatory Visit (INDEPENDENT_AMBULATORY_CARE_PROVIDER_SITE_OTHER): Payer: BC Managed Care – PPO | Admitting: Family Medicine

## 2019-06-24 ENCOUNTER — Encounter: Payer: Self-pay | Admitting: Family Medicine

## 2019-06-24 VITALS — BP 126/82 | HR 90 | Temp 98.4°F | Ht 63.5 in | Wt 168.5 lb

## 2019-06-24 DIAGNOSIS — I10 Essential (primary) hypertension: Secondary | ICD-10-CM

## 2019-06-24 DIAGNOSIS — I639 Cerebral infarction, unspecified: Secondary | ICD-10-CM | POA: Diagnosis not present

## 2019-06-24 DIAGNOSIS — Z794 Long term (current) use of insulin: Secondary | ICD-10-CM

## 2019-06-24 DIAGNOSIS — E785 Hyperlipidemia, unspecified: Secondary | ICD-10-CM

## 2019-06-24 DIAGNOSIS — E119 Type 2 diabetes mellitus without complications: Secondary | ICD-10-CM

## 2019-06-24 DIAGNOSIS — I63412 Cerebral infarction due to embolism of left middle cerebral artery: Secondary | ICD-10-CM | POA: Diagnosis not present

## 2019-06-24 DIAGNOSIS — R4701 Aphasia: Secondary | ICD-10-CM

## 2019-06-24 DIAGNOSIS — F419 Anxiety disorder, unspecified: Secondary | ICD-10-CM | POA: Diagnosis not present

## 2019-06-24 DIAGNOSIS — G250 Essential tremor: Secondary | ICD-10-CM

## 2019-06-24 NOTE — Progress Notes (Signed)
I agree with the above plan 

## 2019-06-24 NOTE — Progress Notes (Signed)
PATIENT: Brittany [name] DOB: Aug 12, 1961  REASON FOR VISIT: follow up HISTORY FROM: patient  Chief Complaint  Patient presents with   Follow-up    room 1, alone. Stroke f/u. "been doing well, depression. left arm can be difficult to move"     HISTORY OF PRESENT ILLNESS: Today 06/24/19 Brittany [name] is a 58 y.o. female here today for stroke follow up. She feels that she is doing well. She feels that speech is better. She continues primidone for tremors, keppra for seizure prophylaxis, Plavix and atorvastatin for stroke prevention. She feels tremor is stable. No seizure activity. She is followed closely by PCP for DM and HLD management. Last A1C was 7.6 in 03/2019. Do not have current lipid panel. She continues to suffer from depression. She continues Zoloft  daily and Ativan as needed. She reports taking it 3-4 times since last being seen by Shanda Bumps.   HISTORY: (copied from [place] Mccue's note on 03/11/2019)  Hospital summary: Brittany Lorence Sowersis an 58 y.o.femalewith a PMH of HTN, HLD, DM, Hx of prior CVA and tobacco abuse who presents to the ED as a code stroke with reports of acute onset Global Aphasia, last seen normal at approximately 09:15 this morning 12/06/2017. CT Head code stroke reveals Acute Left Insula Infarct with suspected embolus in distal Left M2. No contraindications were identified in patients history and IV tPA was administered. IR was notified and patient family was briefed by Dr Laurence Slate. Risks and benefits of IR procedure was explained and family wants to proceed. NIH Stroke Scale: 10. Modified Rankin:Score=1. Patient was administered IV TPA and   She subsequently underwent mechanical thrombectomy of left M2 occlusion with TICI 2b reperfusion.gastritis sig echo was unremarkable. Transesophageal echo showed no Source of embolism. LDL cholesterol 81 mg percent. Hemoglobin A1c was 11.5. She was on aspirin prior to admission which was changed to Plavix. Patient did well  and was subsequently discharged home with home physical occupational speech therapy. Patient subsequently had symptomatic bradycardia and was seen by Dr. Elberta Fortis electrophysiologist and underwent pacemaker insertion with removal of the loop recorder on 01/02/18. She has not yet found A. Fib. The patient's Plavix and Lipitor have been inadvertently discontinued following the admission for pacemaker. She states her sugars under good control. She is still on Keppra which was started when she went back on 12/13/17 to the ER with a witnessed seizure. She's had no further breakthrough seizures. She still has residual aphasia but it is improving. Her home speech therapy is recommending outpatient speech therapy now. Physical and occupational therapy have discharged her. She is still out of work and wants FMLA paperwork filled out.  Update 04/15/2018: Patient is being seen today for follow-up and is accompanied by her two sons.  She continues to have expressive aphasia > receptive aphasia but she continues to participate in speech therapy at neuro rehab clinic.  She states some days are better than others but overall has been improving.  She does notice increased difficulty with expressive aphasia with increased stress or fatigue.  She also has been having difficulty with processing information.  She continues to take aspirin and Plavix without side effects of bleeding or bruising.  Continues to take Lipitor without side effects myalgias.  Blood pressure today satisfactory 125/70.  Patient does not monitor this at home and this was highly recommended.  She does monitor glucose levels at home which have been fluctuating but she does have an appointment scheduled on 05/04/2018 for her first  visit with endocrinologist.  She continues to take Keppra 500 mg twice daily without recent seizure activity.  Patient has not returned to work at this time working at [employer] due to continued global aphasia.  Patient  deals greatly with the public and on the phone with collections department and patient continues to have difficulty with expressive aphasia especially under stressful situations.  She is currently living at home with her husband, son and father who has Alzheimer's.  Patient states she was told by her cardiologist that she can start driving at this time but educated patient on  law stating that she is unable to drive for 6 months after seizure activity.  Patient verbalized understanding.  Patient has been going through stressful situation at home with her son, daughter-in-law and her children as far as divorce and custody battle.  Since this is been occurring, patient has had increase in fluctuation with her expressive aphasia.  Her receptive aphasia continues to improve but does misunderstand words at times and continues to have issues with processing information.  Denies new or worsening stroke/TIA symptoms.  Update 11/02/2018 : She returns for follow-up after last visit 6 months ago.  She is accompanied by her son-in-law.  Patient has noticed worsening of her hand and voice tremor since she was taken off the primidone following her stroke.  The tremors are present at rest and do increase with action and do interfere with her day-to-day activities.  She was started recently back on primidone 2 weeks ago by primary physician and took 125 mg for 2 weeks.  She increase it 250 mg for couple of days but ran out of it and has not been taking the medicine for the last for 5 days.  She has not had any recurrent stroke or TIA symptoms.  She remains on aspirin and Plavix and tolerating it well without bleeding or bruising.  Her blood pressure is well controlled today it is 129/73.  She states her sugars work controlled and the last A1c was below 7.  She is tolerating Lipitor well without muscle aches and pains and last lipid profile checked in December last year was satisfactory.  She continues to have mild residual  aphasia from the stroke but is able to communicate if she speaks slowly and deliberately.  Update 02/09/2019: Follow-up visit today for stroke and essential tremors.  She is currently on primidone 250 mg twice daily with overall improvement of her tremors.  She is now able to use utensils to feed herself without difficulty and no longer interfere with her day-to-day activity.  She is tolerating primidone without side effects.  Residual stroke deficit of expressive aphasia which has recently worsened with increased stress due to recent loss of her father and increase anxiety and worrying over pandemic and protesting.  She continues on Zoloft 200 mg daily which was previously stable.  Her anxiety is worse at night when there is not as much going on and is unable to leave her house due to pandemic.  She becomes tearful speaking about this.  She has nonfluent speech with frequent hesitancy as she states she knows what words she wants to say but unable to get it out.  She denies any other neurological worsening.  She currently lives with her husband, son and grandchild but is able to maintain all ADLs and IADLs independently.  She continues on Keppra without seizure activity or side effects.  Continues on Plavix and atorvastatin without side effects.  Blood  pressure not routinely monitored at home.  Glucose levels have been stable and continues to follow with endocrinology.  Repeat carotid duplex and transcranial Doppler on 11/12/2018 unremarkable.  No further concerns at this time.  03/11/19 visit: Ms. Heatherly is a 58 year old female who is being seen today for follow up and accompanied by her son.  Discussion at prior visit regarding ongoing hearing difficulties poststroke and increased anxiety causing worsening expressive aphasia.  She did have evaluation by ENT where it was felt as though hearing difficulties are more related to communication deficit with processing of information. Anxiety has improved with use of  ativan 0.5mg . she has used approx 4 times over the past month without reported side effects.  She also endorses overall improvement of her speech with at times waxing/waning but overall large improvement.  She also continues on sertraline 200 mg daily. Tremors has been stable with ongoing use of primidone 250 mg twice daily- able to do more ADLs with eating and writing Keppra 500 mg twice daily without recurrent seizure activity Continues on clopidogrel and atorvastatin without reported side effects for secondary stroke prevention Blood pressure today satisfactory 124/75 Glucose levels have been stable No further concerns at this time   REVIEW OF SYSTEMS: Out of a complete 14 system review of symptoms, the patient complains only of the following symptoms, specifically weakness, tremors and all other reviewed systems are negative.  ALLERGIES: No Known Allergies  HOME MEDICATIONS: Outpatient Medications Prior to Visit  Medication Sig Dispense Refill   acetaminophen (TYLENOL) 500 MG tablet Take 1,000 mg by mouth every 6 (six) hours as needed for fever (pain).     atorvastatin (LIPITOR) 40 MG tablet Take 1 tablet (40 mg total) by mouth daily. 90 tablet 1   B Complex-Biotin-FA (MULTI-B COMPLEX) CAPS Take one a day ,B complex  multivitamin over the counter or use prenatal vitamin. 90 capsule 0   clopidogrel (PLAVIX) 75 MG tablet Take 1 tablet (75 mg total) by mouth daily. 90 tablet 3   insulin aspart (NOVOLOG FLEXPEN) 100 UNIT/ML FlexPen 3 times a day (just before each meal), 04-22-11, and pen needles 4/day 15 mL 11   Insulin Detemir (LEVEMIR FLEXTOUCH) 100 UNIT/ML Pen Inject 22 Units into the skin at bedtime. 45 mL 1   Insulin Pen Needle (BD PEN NEEDLE NANO U/F) 32G X 4 MM MISC Use to check blood sugars daily at bedtime 90 each 0   irbesartan (AVAPRO) 150 MG tablet TAKE 1 TABLET(150 MG) BY MOUTH DAILY 90 tablet 3   levETIRAcetam (KEPPRA) 500 MG tablet Take 1 tablet (500 mg total) by mouth  2 (two) times daily. 180 tablet 4   LORazepam (ATIVAN) 0.5 MG tablet Take 0.5-1 tablets (0.25-0.5 mg total) by mouth 2 (two) times daily as needed for anxiety. 45 tablet 0   primidone (MYSOLINE) 250 MG tablet Take 1 tablet (250 mg total) by mouth 2 (two) times daily. Start 1 tablet at bedtime for 2 weeks and increase if tolerated to 1 tablet twice daily 60 tablet 3   sertraline (ZOLOFT) 100 MG tablet Take 2 tablets (200 mg total) by mouth at bedtime. 180 tablet 3   No facility-administered medications prior to visit.     PAST MEDICAL HISTORY: Past Medical History:  Diagnosis Date   Chronic lower back pain    Depression    Fibromyalgia    High cholesterol    Seizures (HCC) 11/2017   "day after she came home from hospital after having stroke" (  01/01/2018)   Stroke ([place]) 12/06/2017   "speech issues since" (01/01/2018)   Type II diabetes mellitus ([place])     PAST SURGICAL HISTORY: Past Surgical History:  Procedure Laterality Date   BACK SURGERY     CHOLECYSTECTOMY     INSERT / REPLACE / REMOVE PACEMAKER  01/01/2018   IR CT HEAD LTD  12/06/2017   IR PERCUTANEOUS ART THROMBECTOMY/INFUSION INTRACRANIAL INC DIAG ANGIO  12/06/2017   LOOP RECORDER INSERTION N/A 12/12/2017   Procedure: LOOP RECORDER INSERTION;  Surgeon: Constance Haw, MD;  Location: [facility];  Service: Cardiovascular;  Laterality: N/A;   LOOP RECORDER REMOVAL  01/01/2018   LOOP RECORDER REMOVAL N/A 01/01/2018   Procedure: LOOP RECORDER REMOVAL;  Surgeon: Constance Haw, MD;  Location: [facility];  Service: Cardiovascular;  Laterality: N/A;   PACEMAKER IMPLANT N/A 01/01/2018   Procedure: PACEMAKER IMPLANT;  Surgeon: Constance Haw, MD;  Location: [facility];  Service: Cardiovascular;  Laterality: N/A;   RADIOLOGY WITH ANESTHESIA N/A 12/06/2017   Procedure: CODE STROKE;  Surgeon: Luanne Bras, MD;  Location: [facility];  Service: Radiology;  Laterality: N/A;   TEE  WITHOUT CARDIOVERSION N/A 12/10/2017   Procedure: TRANSESOPHAGEAL ECHOCARDIOGRAM (TEE);  Surgeon: Jerline Pain, MD;  Location: [facility];  Service: Cardiovascular;  Laterality: N/A;   THROMBECTOMY FEMORAL ARTERY Right 12/07/2017   Procedure: THROMBECTOMY RIGHT FEMORAL ARTERY;  Surgeon: Waynetta Sandy, MD;  Location: [facility] OR;  Service: Vascular;  Laterality: Right;   TONSILLECTOMY      FAMILY HISTORY: Family History  Problem Relation Age of Onset   Cancer Mother    Diabetes Mother    Heart disease Mother    Parkinsonism Mother    Diabetes Father    Heart disease Father    Dementia Father    Cancer Maternal Grandmother 40       lung   COPD Maternal Aunt     SOCIAL HISTORY: Social History   Socioeconomic History   Marital status: Married    Spouse name: [name]   Number of children: 2   Years of education: 14   Highest education level: Not on file  Occupational History   Not on file  Social Needs   Financial resource strain: Not on file   Food insecurity    Worry: Not on file    Inability: Not on file   Transportation needs    Medical: Not on file    Non-medical: Not on file  Tobacco Use   Smoking status: Former Smoker    Packs/day: 1.00    Years: 38.00    Pack years: 38.00    Types: Cigarettes    Quit date: 12/06/2017    Years since quitting: 1.5   Smokeless tobacco: Never Used  Substance and Sexual Activity   Alcohol use: Yes    Comment: 01/01/2018 "nothing since stroke 11/2017"   Drug use: No   Sexual activity: Not Currently  Lifestyle   Physical activity    Days per week: Not on file    Minutes per session: Not on file   Stress: Not on file  Relationships   Social connections    Talks on phone: Not on file    Gets together: Not on file    Attends religious service: Not on file    Active member of club or organization: Not on file    Attends meetings of clubs or organizations: Not on file    Relationship status:  Not on  file   Intimate partner violence    Fear of current or ex partner: Not on file    Emotionally abused: Not on file    Physically abused: Not on file    Forced sexual activity: Not on file  Other Topics Concern   Not on file  Social History Narrative   Patient is married ([name]) and lives at home with her family   Patient has two children.   Patient works at [employer].   Patient has a college education.   Patient is right-handed.   Patient drinks 3 cups of coffee M-F.         PHYSICAL EXAM  Vitals:   06/24/19 1306  BP: 126/82  Pulse: 90  Temp: 98.4 F (36.9 C)  Weight: 168 lb 8 oz (76.4 kg)  Height: 5' 3.5" (1.613 m)   Body mass index is 29.38 kg/m.  Generalized: Well developed, in no acute distress  Cardiology: normal rate and rhythm, no murmur noted Neurological examination  Mentation: Alert oriented to time, place, history taking. Follows all commands speech and language fluent Cranial nerve II-XII: Pupils were equal round reactive to light. Extraocular movements were full, visual field were full on confrontational test. Facial sensation and strength were normal. Uvula tongue midline. Head turning and shoulder shrug  were normal and symmetric. Motor: The motor testing reveals 5 over 5 strength of all 4 extremities. Good symmetric motor tone is noted throughout.  Sensory: Sensory testing is intact to soft touch on all 4 extremities. No evidence of extinction is noted.  Coordination: Cerebellar testing reveals good finger-nose-finger and heel-to-shin bilaterally.  Gait and station: Gait is normal. Tandem gait is normal. Romberg is negative. No drift is seen.  Reflexes: Deep tendon reflexes are symmetric and normal bilaterally.   DIAGNOSTIC DATA (LABS, IMAGING, TESTING) - I reviewed patient records, labs, notes, testing and imaging myself where available.  No flowsheet data found.   Lab Results  Component Value Date   WBC 7.0 01/01/2018   HGB 13.4  01/01/2018   HCT 39.9 01/01/2018   MCV 89.7 01/01/2018   PLT 210 01/01/2018      Component Value Date/Time   NA 146 (H) 08/28/2018 1516   K 4.5 08/28/2018 1516   CL 108 (H) 08/28/2018 1516   CO2 21 08/28/2018 1516   GLUCOSE 95 08/28/2018 1516   GLUCOSE 210 (H) 12/13/2017 1841   BUN 16 08/28/2018 1516   CREATININE 0.78 08/28/2018 1516   CREATININE 0.59 07/13/2016 1112   CALCIUM 10.5 (H) 08/28/2018 1516   PROT 7.1 08/28/2018 1516   ALBUMIN 4.3 08/28/2018 1516   AST 14 08/28/2018 1516   ALT 13 08/28/2018 1516   ALKPHOS 131 (H) 08/28/2018 1516   BILITOT 0.3 08/28/2018 1516   GFRNONAA 83 08/28/2018 1516   GFRNONAA >89 07/15/2014 0947   GFRAA 96 08/28/2018 1516   GFRAA >89 07/15/2014 0947   Lab Results  Component Value Date   CHOL 169 08/28/2018   HDL 51 08/28/2018   LDLCALC 79 08/28/2018   LDLDIRECT 61 12/03/2013   TRIG 193 (H) 08/28/2018   CHOLHDL 3.3 08/28/2018   Lab Results  Component Value Date   HGBA1C 7.3 (A) 05/11/2019   No results found for: VITAMINB12 Lab Results  Component Value Date   TSH 2.240 09/03/2018       ASSESSMENT AND PLAN 58 y.o. year old female  has a past medical history of Chronic lower back pain, Depression, Fibromyalgia, High  cholesterol, Seizures (HCC) (11/2017), Stroke (HCC) (12/06/2017), and Type II diabetes mellitus (HCC). here with     ICD-10-CM   1. 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 cerebrovascular accident (CVA) (HCC)  I63.9    R47.01   3. Essential tremor  G25.0   4. Anxiety  F41.9   5. Hyperlipidemia LDL goal <70  E78.5   6. Essential hypertension, benign  I10   7. Diabetes mellitus type 2, insulin dependent (HCC)  E11.9    Z79.4     Overall Keirsten is doing very well.  Tremors are her stable on primidone.  No seizure activity on Keppra.  She continues Zoloft 200 mg for depression and feels that she is doing okay.  She takes  Ativan rarely for anxiety.  She continues Plavix and Lipitor for stroke prevention.  She is followed closely by primary care.  Last A1c 7.6.  Blood pressures have been normal.  She will continue current treatment plan.  We have discussed goals for blood pressure, cholesterol and A1c.  She will continue close follow-up with primary care as well.  I would like for her to return to see Shanda Bumps in 3.  She verbalizes understanding and agreement with this plan.   No orders of the defined types were placed in this encounter.    No orders of the defined types were placed in this encounter.     I spent 30 minutes with the patient. 50% of this time was spent counseling and educating patient on plan of care and medications.    Shawnie Dapper, FNP-C 06/24/2019, 1:32 PM [facility] [address] [place], [place] [address] [phone]

## 2019-06-24 NOTE — Patient Instructions (Signed)
Continue current treatment plan  Follow up closely with PCP  [place] up with Shanda Bumps in 3 months   Stroke Prevention Some medical conditions and lifestyle choices can lead to a higher risk for a stroke. You can help to prevent a stroke by making nutrition, lifestyle, and other changes. What nutrition changes can be made?   Eat healthy foods. ? Choose foods that are high in fiber. These include:  Fresh fruits.  Fresh vegetables.  Whole grains. ? Eat at least 5 or more servings of fruits and vegetables each day. Try to fill half of your plate at each meal with fruits and vegetables. ? Choose lean protein foods. These include:  Lowfat (lean) cuts of meat.  Chicken without skin.  Fish.  Tofu.  Beans.  Nuts. ? Eat low-fat dairy products. ? Avoid foods that:  Are high in salt (sodium).  Have saturated fat.  Have trans fat.  Have cholesterol.  Are processed.  Are premade.  Follow eating guidelines as told by your doctor. These may include: ? Reducing how many calories you eat and drink each day. ? Limiting how much salt you eat or drink each day to 1,500 milligrams (mg). ? Using only healthy fats for cooking. These include:  Olive oil.  Canola oil.  Sunflower oil. ? Counting how many carbohydrates you eat and drink each day. What lifestyle changes can be made?  Try to stay at a healthy weight. Talk to your doctor about what a good weight is for you.  Get at least 30 minutes of moderate physical activity at least 5 days a week. This can include: ? Fast walking. ? Biking. ? Swimming.  Do not use any products that have nicotine or tobacco. This includes cigarettes and e-cigarettes. If you need help quitting, ask your doctor. Avoid being around tobacco smoke in general.  Limit how much alcohol you drink to no more than 1 drink a day for nonpregnant women and 2 drinks a day for men. One drink equals 12 oz of beer, 5 oz of wine, or 1 oz of hard liquor.  Do  not use drugs.  Avoid taking birth control pills. Talk to your doctor about the risks of taking birth control pills if: ? You are over 27 years old. ? You smoke. ? You get migraines. ? You have had a blood clot. What other changes can be made?  Manage your cholesterol. ? It is important to eat a healthy diet. ? If your cholesterol cannot be managed through your diet, you may also need to take medicines. Take medicines as told by your doctor.  Manage your diabetes. ? It is important to eat a healthy diet and to exercise regularly. ? If your blood sugar cannot be managed through diet and exercise, you may need to take medicines. Take medicines as told by your doctor.  Control your high blood pressure (hypertension). ? Try to keep your blood pressure below 130/80. This can help lower your risk of stroke. ? It is important to eat a healthy diet and to exercise regularly. ? If your blood pressure cannot be managed through diet and exercise, you may need to take medicines. Take medicines as told by your doctor. ? Ask your doctor if you should check your blood pressure at home. ? Have your blood pressure checked every year. Do this even if your blood pressure is normal.  Talk to your doctor about getting checked for a sleep disorder. Signs of this can include: ? Snoring  a lot. ? Feeling very tired.  Take over-the-counter and prescription medicines only as told by your doctor. These may include aspirin or blood thinners (antiplatelets or anticoagulants).  Make sure that any other medical conditions you have are managed. Where to find more information  American Stroke Association: www.strokeassociation.org  National Stroke Association: www.stroke.org Get help right away if:  You have any symptoms of stroke. "BE FAST" is an easy way to remember the main warning signs: ? B - Balance. Signs are dizziness, sudden trouble walking, or loss of balance. ? E - Eyes. Signs are trouble seeing or  a sudden change in how you see. ? F - Face. Signs are sudden weakness or loss of feeling of the face, or the face or eyelid drooping on one side. ? A - Arms. Signs are weakness or loss of feeling in an arm. This happens suddenly and usually on one side of the body. ? S - Speech. Signs are sudden trouble speaking, slurred speech, or trouble understanding what people say. ? T - Time. Time to call emergency services. Write down what time symptoms started.  You have other signs of stroke, such as: ? A sudden, very bad headache with no known cause. ? Feeling sick to your stomach (nausea). ? Throwing up (vomiting). ? Jerky movements you cannot control (seizure). These symptoms may represent a serious problem that is an emergency. Do not wait to see if the symptoms will go away. Get medical help right away. Call your local emergency services (911 in the [place] Do not drive yourself to the hospital. Summary  You can prevent a stroke by eating healthy, exercising, not smoking, drinking less alcohol, and treating other health problems, such as diabetes, high blood pressure, or high cholesterol.  Do not use any products that contain nicotine or tobacco, such as cigarettes and e-cigarettes.  Get help right away if you have any signs or symptoms of a stroke. This information is not intended to replace advice given to you by your health care provider. Make sure you discuss any questions you have with your health care provider. Document Released: 02/25/2012 Document Revised: 10/22/2018 Document Reviewed: 11/27/2016 Elsevier Patient Education  2020 [employer].

## 2019-07-06 ENCOUNTER — Ambulatory Visit (INDEPENDENT_AMBULATORY_CARE_PROVIDER_SITE_OTHER): Payer: BC Managed Care – PPO | Admitting: *Deleted

## 2019-07-06 DIAGNOSIS — I679 Cerebrovascular disease, unspecified: Secondary | ICD-10-CM

## 2019-07-06 DIAGNOSIS — R55 Syncope and collapse: Secondary | ICD-10-CM | POA: Diagnosis not present

## 2019-07-07 LAB — CUP PACEART REMOTE DEVICE CHECK
Battery Remaining Longevity: 166 mo
Battery Voltage: 3.05 V
Brady Statistic AP VP Percent: 1.84 %
Brady Statistic AP VS Percent: 0.62 %
Brady Statistic AS VP Percent: 1.93 %
Brady Statistic AS VS Percent: 95.61 %
Brady Statistic RA Percent Paced: 2.53 %
Brady Statistic RV Percent Paced: 3.77 %
Date Time Interrogation Session: 20201027043951
Implantable Lead Implant Date: 20190425
Implantable Lead Implant Date: 20190425
Implantable Lead Location: 753859
Implantable Lead Location: 753860
Implantable Lead Model: 5076
Implantable Lead Model: 5076
Implantable Pulse Generator Implant Date: 20190425
Lead Channel Impedance Value: 361 Ohm
Lead Channel Impedance Value: 437 Ohm
Lead Channel Impedance Value: 513 Ohm
Lead Channel Impedance Value: 532 Ohm
Lead Channel Pacing Threshold Amplitude: 0.625 V
Lead Channel Pacing Threshold Amplitude: 0.875 V
Lead Channel Pacing Threshold Pulse Width: 0.4 ms
Lead Channel Pacing Threshold Pulse Width: 0.4 ms
Lead Channel Sensing Intrinsic Amplitude: 0.5 mV
Lead Channel Sensing Intrinsic Amplitude: 0.5 mV
Lead Channel Sensing Intrinsic Amplitude: 19.75 mV
Lead Channel Sensing Intrinsic Amplitude: 19.75 mV
Lead Channel Setting Pacing Amplitude: 1.75 V
Lead Channel Setting Pacing Amplitude: 2.5 V
Lead Channel Setting Pacing Pulse Width: 0.4 ms
Lead Channel Setting Sensing Sensitivity: 2 mV

## 2019-07-08 ENCOUNTER — Other Ambulatory Visit: Payer: Self-pay

## 2019-07-12 ENCOUNTER — Encounter: Payer: Self-pay | Admitting: Endocrinology

## 2019-07-12 ENCOUNTER — Ambulatory Visit (INDEPENDENT_AMBULATORY_CARE_PROVIDER_SITE_OTHER): Payer: BC Managed Care – PPO | Admitting: Endocrinology

## 2019-07-12 VITALS — BP 120/80 | HR 103 | Ht 63.5 in | Wt 169.4 lb

## 2019-07-12 DIAGNOSIS — E1149 Type 2 diabetes mellitus with other diabetic neurological complication: Secondary | ICD-10-CM | POA: Diagnosis not present

## 2019-07-12 DIAGNOSIS — Z23 Encounter for immunization: Secondary | ICD-10-CM | POA: Diagnosis not present

## 2019-07-12 DIAGNOSIS — Z794 Long term (current) use of insulin: Secondary | ICD-10-CM | POA: Diagnosis not present

## 2019-07-12 LAB — POCT GLYCOSYLATED HEMOGLOBIN (HGB A1C): Hemoglobin A1C: 7.3 % — AB (ref 4.0–5.6)

## 2019-07-12 MED ORDER — NOVOLOG FLEXPEN 100 UNIT/ML ~~LOC~~ SOPN: PEN_INJECTOR | SUBCUTANEOUS | 11 refills | Status: DC

## 2019-07-12 MED ORDER — LEVEMIR FLEXTOUCH 100 UNIT/ML ~~LOC~~ SOPN: 20.0000 [IU] | PEN_INJECTOR | Freq: Every day | SUBCUTANEOUS | 1 refills | Status: DC

## 2019-07-12 NOTE — Progress Notes (Signed)
Subjective:    Patient ID: Brittany [name], female    DOB: 1961-09-02, 58 y.o.   MRN: [id number]  HPI Pt returns for f/u of diabetes mellitus: DM type: Insulin-requiring type 2 Dx'ed: 1999 Complications: CVA Therapy: insulin since early 2019 GDM: 1987 DKA: never Severe hypoglycemia: never Pancreatitis: never Pancreatic imaging: normal on 2019 CT.   Other: son gives pt her insulin (due to CVA--he says he knows how to use syringe and vial); she gets multiple daily injections.   Interval history: she brings a record of her cbg's which I have reviewed today.  cbg varies from 64-265.  There is no trend throughout the day.  She never misses the insulin.  She has mild hypoglycemia approx once per week.  She is unable to cite a cause, but is does not happen in the fasting state.  No new sxs.   Past Medical History:  Diagnosis Date  . Chronic lower back pain   . Depression   . Fibromyalgia   . High cholesterol   . Seizures (HCC) 11/2017   "day after she came home from hospital after having stroke" (01/01/2018)  . Stroke [facility](HCC) 12/06/2017   "speech issues since" (01/01/2018)  . Type II diabetes mellitus (HCC)     Past Surgical History:  Procedure Laterality Date  . BACK SURGERY    . CHOLECYSTECTOMY    . INSERT / REPLACE / REMOVE PACEMAKER  01/01/2018  . IR CT HEAD LTD  12/06/2017  . IR PERCUTANEOUS ART THROMBECTOMY/INFUSION INTRACRANIAL INC DIAG ANGIO  12/06/2017  . LOOP RECORDER INSERTION N/A 12/12/2017   Procedure: LOOP RECORDER INSERTION;  Surgeon: Regan Lemmingamnitz, Will Martin, MD;  [facility]: [facility];  Service: Cardiovascular;  Laterality: N/A;  . LOOP RECORDER REMOVAL  01/01/2018  . LOOP RECORDER REMOVAL N/A 01/01/2018   Procedure: LOOP RECORDER REMOVAL;  Surgeon: Regan Lemmingamnitz, Will Martin, MD;  Location: [facility];  Service: Cardiovascular;  Laterality: N/A;  . PACEMAKER IMPLANT N/A 01/01/2018   Procedure: PACEMAKER IMPLANT;  Surgeon: Regan Lemmingamnitz, Will Martin, MD;  Location: [facility]  INVASIVE CV LAB;  Service: Cardiovascular;  Laterality: N/A;  . RADIOLOGY WITH ANESTHESIA N/A 12/06/2017   Procedure: CODE STROKE;  Surgeon: Julieanne Cottoneveshwar, Sanjeev, MD;  Location: [facility] OR;  Service: Radiology;  Laterality: N/A;  . TEE WITHOUT CARDIOVERSION N/A 12/10/2017   Procedure: TRANSESOPHAGEAL ECHOCARDIOGRAM (TEE);  Surgeon: Jake BatheSkains, Mark C, MD;  [facility];  Service: Cardiovascular;  Laterality: N/A;  . THROMBECTOMY FEMORAL ARTERY Right 12/07/2017   Procedure: THROMBECTOMY RIGHT FEMORAL ARTERY;  Surgeon: Maeola Harmanain, Brandon Christopher, MD;  [facility];  Service: Vascular;  Laterality: Right;  . TONSILLECTOMY      Social History   Socioeconomic History  . Marital status: Married    Spouse name: Jonny RuizJohn  . Number of children: 2  . Years of education: 5014  . Highest education level: Not on file  Occupational History  . Not on file  Social Needs  . Financial resource strain: Not on file  . Food insecurity    Worry: Not on file    Inability: Not on file  . Transportation needs    Medical: Not on file    Non-medical: Not on file  Tobacco Use  . Smoking status: Former Smoker    Packs/day: 1.00    Years: 38.00    Pack years: 38.00    Types: Cigarettes    Quit date: 12/06/2017    Years since quitting: 1.6  . Smokeless tobacco: Never Used  Substance and Sexual Activity  . Alcohol use: Yes    Comment: 01/01/2018 "nothing since stroke 11/2017"  . Drug use: No  . Sexual activity: Not Currently  Lifestyle  . Physical activity    Days per week: Not on file    Minutes per session: Not on file  . Stress: Not on file  Relationships  . Social Herbalist on phone: Not on file    Gets together: Not on file    Attends religious service: Not on file    Active member of club or organization: Not on file    Attends meetings of clubs or organizations: Not on file    Relationship status: Not on file  . Intimate partner violence    Fear of current or ex partner: Not on file     Emotionally abused: Not on file    Physically abused: Not on file    Forced sexual activity: Not on file  Other Topics Concern  . Not on file  Social History Narrative   Patient is married ([name]) and lives at home with her family   Patient has two children.   Patient works at [employer].   Patient has a college education.   Patient is right-handed.   Patient drinks 3 cups of coffee M-F.       Current Outpatient Medications on File Prior to Visit  Medication Sig Dispense Refill  . acetaminophen (TYLENOL) 500 MG tablet Take 1,000 mg by mouth every 6 (six) hours as needed for fever (pain).    Marland Kitchen atorvastatin (LIPITOR) 40 MG tablet Take 1 tablet (40 mg total) by mouth daily. 90 tablet 1  . B Complex-Biotin-FA (MULTI-B COMPLEX) CAPS Take one a day ,B complex  multivitamin over the counter or use prenatal vitamin. 90 capsule 0  . clopidogrel (PLAVIX) 75 MG tablet Take 1 tablet (75 mg total) by mouth daily. 90 tablet 3  . Insulin Pen Needle (BD PEN NEEDLE NANO U/F) 32G X 4 MM MISC Use to check blood sugars daily at bedtime 90 each 0  . irbesartan (AVAPRO) 150 MG tablet TAKE 1 TABLET(150 MG) BY MOUTH DAILY 90 tablet 3  . levETIRAcetam (KEPPRA) 500 MG tablet Take 1 tablet (500 mg total) by mouth 2 (two) times daily. 180 tablet 4  . LORazepam (ATIVAN) 0.5 MG tablet Take 0.5-1 tablets (0.25-0.5 mg total) by mouth 2 (two) times daily as needed for anxiety. 45 tablet 0  . primidone (MYSOLINE) 250 MG tablet Take 1 tablet (250 mg total) by mouth 2 (two) times daily. Start 1 tablet at bedtime for 2 weeks and increase if tolerated to 1 tablet twice daily 60 tablet 3  . sertraline (ZOLOFT) 100 MG tablet Take 2 tablets (200 mg total) by mouth at bedtime. 180 tablet 3   No current facility-administered medications on file prior to visit.     No Known Allergies  Family History  Problem Relation Age of Onset  . Cancer Mother   . Diabetes Mother   . Heart disease Mother   . Parkinsonism  Mother   . Diabetes Father   . Heart disease Father   . Dementia Father   . Cancer Maternal Grandmother 41       lung  . COPD Maternal Aunt     BP 120/80 (BP Location: Left Arm, Patient Position: Sitting, Cuff Size: Normal)   Pulse (!) 103   Ht 5' 3.5" (1.613 m)   Wt 169 lb 6.4 oz (  76.8 kg)   SpO2 97%   BMI 29.54 kg/m    Review of Systems Denies LOC.      Objective:   Physical Exam VITAL SIGNS:  See vs page GENERAL: no distress Pulses: dorsalis pedis intact bilat.   MSK: no deformity of the feet.  CV: no leg edema.   Skin:  no ulcer on the feet.  normal color and temp on the feet.   Neuro: sensation is intact to touch on the feet.  coarse generalized tremor.    Lab Results  Component Value Date   HGBA1C 7.3 (A) 07/12/2019       Assessment & Plan:  Insulin-requiring type 2 DM: Based on the pattern of her cbg's, she needs some adjustment in her therapy.  CVA: in this context, she should avoid hypoglycemia.   Hypoglycemia: this limits aggressiveness of glycemic control.   Patient Instructions  Your blood pressure is high today.  Please see your primary care provider soon, to have it rechecked'check your blood sugar 4 times a day: before the 3 meals, and at bedtime.  also check if you have symptoms of your blood sugar being too high or too low.  please keep a record of the readings and bring it to your next appointment here (or you can bring the meter itself).  You can write it on any piece of paper.  please call us sooner if your blood sugar goes below 70, or if you have a lot of readings over 200.   Please change the insulins to the numbers listed below.   Please come back for a follow-up appointment in 2 months.

## 2019-07-12 NOTE — Patient Instructions (Signed)
Your blood pressure is high today.  Please see your primary care provider soon, to have it rechecked'check your blood sugar 4 times a day: before the 3 meals, and at bedtime.  also check if you have symptoms of your blood sugar being too high or too low.  please keep a record of the readings and bring it to your next appointment here (or you can bring the meter itself).  You can write it on any piece of paper.  please call us sooner if your blood sugar goes below 70, or if you have a lot of readings over 200.   Please change the insulins to the numbers listed below.   Please come back for a follow-up appointment in 2 months.

## 2019-07-13 ENCOUNTER — Encounter: Payer: Self-pay | Admitting: Family Medicine

## 2019-07-19 ENCOUNTER — Other Ambulatory Visit: Payer: Self-pay

## 2019-07-19 DIAGNOSIS — Z794 Long term (current) use of insulin: Secondary | ICD-10-CM

## 2019-07-19 DIAGNOSIS — E1149 Type 2 diabetes mellitus with other diabetic neurological complication: Secondary | ICD-10-CM

## 2019-07-19 MED ORDER — BD PEN NEEDLE NANO U/F 32G X 4 MM MISC: 1.0000 | Freq: Four times a day (QID) | 2 refills | Status: DC

## 2019-07-28 NOTE — Progress Notes (Signed)
Remote pacemaker transmission.   

## 2019-08-11 DIAGNOSIS — Z0289 Encounter for other administrative examinations: Secondary | ICD-10-CM

## 2019-08-19 ENCOUNTER — Telehealth: Payer: Self-pay

## 2019-08-19 NOTE — Telephone Encounter (Signed)
Disability form done for [employer] long term disability done. Fee paid by pt.Form given to Hilda Blades in medical records for faxing.

## 2019-08-27 ENCOUNTER — Telehealth: Payer: Self-pay | Admitting: Endocrinology

## 2019-08-27 NOTE — Telephone Encounter (Signed)
MEDICATION: Levemir Flextouch  PHARMACY:  [facility] on [address]  IS THIS A 90 DAY SUPPLY :   IS PATIENT OUT OF MEDICATION: yes  IF NOT; HOW MUCH IS LEFT:   LAST APPOINTMENT DATE: @11 /10/2018  NEXT APPOINTMENT DATE:@1 /02/2020  DO WE HAVE YOUR PERMISSION TO LEAVE A DETAILED MESSAGE: yes  OTHER COMMENTS:    **Let patient know to contact pharmacy at the end of the day to make sure medication is ready. **  ** Please notify patient to allow 48-72 hours to process**  **Encourage patient to contact the pharmacy for refills or they can request refills through [facility]**

## 2019-08-30 ENCOUNTER — Other Ambulatory Visit: Payer: Self-pay

## 2019-08-30 DIAGNOSIS — E1149 Type 2 diabetes mellitus with other diabetic neurological complication: Secondary | ICD-10-CM

## 2019-08-30 MED ORDER — LEVEMIR FLEXTOUCH 100 UNIT/ML ~~LOC~~ SOPN: 20.0000 [IU] | PEN_INJECTOR | Freq: Every day | SUBCUTANEOUS | 1 refills | Status: DC

## 2019-08-30 NOTE — Telephone Encounter (Signed)
Insulin Detemir (LEVEMIR FLEXTOUCH) 100 UNIT/ML Pen 45 mL 1 08/30/2019    Sig - Route: Inject 20 Units into the skin at bedtime. - Subcutaneous   Sent to pharmacy as: Insulin Detemir (LEVEMIR FLEXTOUCH) 100 UNIT/ML Pen   E-Prescribing Status: Receipt confirmed by pharmacy (08/30/2019  8:22 AM EST)

## 2019-09-14 ENCOUNTER — Other Ambulatory Visit: Payer: Self-pay

## 2019-09-15 ENCOUNTER — Ambulatory Visit (INDEPENDENT_AMBULATORY_CARE_PROVIDER_SITE_OTHER): Payer: BC Managed Care – PPO | Admitting: Endocrinology

## 2019-09-15 ENCOUNTER — Encounter: Payer: Self-pay | Admitting: Endocrinology

## 2019-09-15 VITALS — BP 124/70 | HR 94 | Ht 63.5 in | Wt 170.0 lb

## 2019-09-15 DIAGNOSIS — Z794 Long term (current) use of insulin: Secondary | ICD-10-CM | POA: Diagnosis not present

## 2019-09-15 DIAGNOSIS — E1149 Type 2 diabetes mellitus with other diabetic neurological complication: Secondary | ICD-10-CM

## 2019-09-15 LAB — POCT GLYCOSYLATED HEMOGLOBIN (HGB A1C): Hemoglobin A1C: 7.3 % — AB (ref 4.0–5.6)

## 2019-09-15 MED ORDER — LEVEMIR FLEXTOUCH 100 UNIT/ML ~~LOC~~ SOPN: 18.0000 [IU] | PEN_INJECTOR | Freq: Every day | SUBCUTANEOUS | 1 refills | Status: DC

## 2019-09-15 NOTE — Progress Notes (Signed)
Subjective:    Patient ID: Brittany [name], female    DOB: 12-13-1960, 59 y.o.   MRN: [id number]  HPI Pt returns for f/u of diabetes mellitus: DM type: Insulin-requiring type 2 Dx'ed: 1999 Complications: CVA Therapy: insulin since early 2019 GDM: 1987 DKA: never Severe hypoglycemia: never Pancreatitis: never Pancreatic imaging: normal on 2019 CT.   Other: son gives pt her insulin (due to CVA--he says he knows how to use syringe and vial); she gets multiple daily injections.   Interval history: she brings a record of her cbg's which I have reviewed today.  cbg varies from 68-282.  There is no trend throughout the day, but pt says it is lowest when a meal (and that dose of Novolog) is delayed.  She never misses the insulin.  She has mild hypoglycemia approx once per week.  She is unable to cite a cause, but is does not happen in the fasting state.  No new sxs.   Past Medical History:  Diagnosis Date  . Chronic lower back pain   . Depression   . Fibromyalgia   . High cholesterol   . Seizures (HCC) 11/2017   "day after she came home from hospital after having stroke" (01/01/2018)  . Stroke [facility]) 12/06/2017   "speech issues since" (01/01/2018)  . Type II diabetes mellitus (HCC)     Past Surgical History:  Procedure Laterality Date  . BACK SURGERY    . CHOLECYSTECTOMY    . INSERT / REPLACE / REMOVE PACEMAKER  01/01/2018  . IR CT HEAD LTD  12/06/2017  . IR PERCUTANEOUS ART THROMBECTOMY/INFUSION INTRACRANIAL INC DIAG ANGIO  12/06/2017  . LOOP RECORDER INSERTION N/A 12/12/2017   Procedure: LOOP RECORDER INSERTION;  Surgeon: Regan Lemming, MD;  [facility]: [facility];  Service: Cardiovascular;  Laterality: N/A;  . LOOP RECORDER REMOVAL  01/01/2018  . LOOP RECORDER REMOVAL N/A 01/01/2018   Procedure: LOOP RECORDER REMOVAL;  Surgeon: Regan Lemming, MD;  Location: [facility];  Service: Cardiovascular;  Laterality: N/A;  . PACEMAKER IMPLANT N/A 01/01/2018   Procedure: PACEMAKER IMPLANT;  Surgeon: Regan Lemming, MD;  [facility]: [facility];  Service: Cardiovascular;  Laterality: N/A;  . RADIOLOGY WITH ANESTHESIA N/A 12/06/2017   Procedure: CODE STROKE;  Surgeon: Julieanne Cotton, MD;  Location: [facility] OR;  Service: Radiology;  Laterality: N/A;  . TEE WITHOUT CARDIOVERSION N/A 12/10/2017   Procedure: TRANSESOPHAGEAL ECHOCARDIOGRAM (TEE);  Surgeon: Jake Bathe, MD;  Location: [facility] ENDOSCOPY;  Service: Cardiovascular;  Laterality: N/A;  . THROMBECTOMY FEMORAL ARTERY Right 12/07/2017   Procedure: THROMBECTOMY RIGHT FEMORAL ARTERY;  Surgeon: Maeola Harman, MD;  Location: [facility] OR;  Service: Vascular;  Laterality: Right;  . TONSILLECTOMY      Social History   Socioeconomic History  . Marital status: Married    Spouse name: [name]  . Number of children: 2  . Years of education: 29  . Highest education level: Not on file  Occupational History  . Not on file  Tobacco Use  . Smoking status: Former Smoker    Packs/day: 1.00    Years: 38.00    Pack years: 38.00    Types: Cigarettes    Quit date: 12/06/2017    Years since quitting: 1.7  . Smokeless tobacco: Never Used  Substance and Sexual Activity  . Alcohol use: Yes    Comment: 01/01/2018 "nothing since stroke 11/2017"  . Drug use: No  . Sexual activity: Not Currently  Other  Topics Concern  . Not on file  Social History Narrative   Patient is married ([name]) and lives at home with her family   Patient has two children.   Patient works at [employer].   Patient has a college education.   Patient is right-handed.   Patient drinks 3 cups of coffee M-F.      Social Determinants of Health   Financial Resource Strain:   . Difficulty of Paying Living Expenses: Not on file  Food Insecurity:   . Worried About Programme researcher, broadcasting/film/video in the Last Year: Not on file  . Ran Out of Food in the Last Year: Not on file  Transportation Needs:   . Lack of Transportation (Medical):  Not on file  . Lack of Transportation (Non-Medical): Not on file  Physical Activity:   . Days of Exercise per Week: Not on file  . Minutes of Exercise per Session: Not on file  Stress:   . Feeling of Stress : Not on file  Social Connections:   . Frequency of Communication with Friends and Family: Not on file  . Frequency of Social Gatherings with Friends and Family: Not on file  . Attends Religious Services: Not on file  . Active Member of Clubs or Organizations: Not on file  . Attends Banker Meetings: Not on file  . Marital Status: Not on file  Intimate Partner Violence:   . Fear of Current or Ex-Partner: Not on file  . Emotionally Abused: Not on file  . Physically Abused: Not on file  . Sexually Abused: Not on file    Current Outpatient Medications on File Prior to Visit  Medication Sig Dispense Refill  . acetaminophen (TYLENOL) 500 MG tablet Take 1,000 mg by mouth every 6 (six) hours as needed for fever (pain).    Marland Kitchen atorvastatin (LIPITOR) 40 MG tablet Take 1 tablet (40 mg total) by mouth daily. 90 tablet 1  . B Complex-Biotin-FA (MULTI-B COMPLEX) CAPS Take one a day ,B complex  multivitamin over the counter or use prenatal vitamin. 90 capsule 0  . clopidogrel (PLAVIX) 75 MG tablet Take 1 tablet (75 mg total) by mouth daily. 90 tablet 3  . insulin aspart (NOVOLOG FLEXPEN) 100 UNIT/ML FlexPen 3 times a day (just before each meal), 04-20-10, and pen needles 4/day 15 mL 11  . Insulin Pen Needle (BD PEN NEEDLE NANO U/F) 32G X 4 MM MISC 1 each by Other route 4 (four) times daily. 120 each 2  . irbesartan (AVAPRO) 150 MG tablet TAKE 1 TABLET(150 MG) BY MOUTH DAILY 90 tablet 3  . levETIRAcetam (KEPPRA) 500 MG tablet Take 1 tablet (500 mg total) by mouth 2 (two) times daily. 180 tablet 4  . LORazepam (ATIVAN) 0.5 MG tablet Take 0.5-1 tablets (0.25-0.5 mg total) by mouth 2 (two) times daily as needed for anxiety. 45 tablet 0  . primidone (MYSOLINE) 250 MG tablet Take 1 tablet  (250 mg total) by mouth 2 (two) times daily. Start 1 tablet at bedtime for 2 weeks and increase if tolerated to 1 tablet twice daily 60 tablet 3  . sertraline (ZOLOFT) 100 MG tablet Take 2 tablets (200 mg total) by mouth at bedtime. 180 tablet 3   No current facility-administered medications on file prior to visit.    No Known Allergies  Family History  Problem Relation Age of Onset  . Cancer Mother   . Diabetes Mother   . Heart disease Mother   . Parkinsonism  Mother   . Diabetes Father   . Heart disease Father   . Dementia Father   . Cancer Maternal Grandmother 73       lung  . COPD Maternal Aunt     BP 124/70 (BP Location: Left Arm, Patient Position: Sitting, Cuff Size: Normal)   Pulse 94   Ht 5' 3.5" (1.613 m)   Wt 170 lb (77.1 kg)   SpO2 94%   BMI 29.64 kg/m    Review of Systems Denies LOC.      Objective:   Physical Exam VITAL SIGNS:  See vs page GENERAL: no distress Pulses: dorsalis pedis intact bilat.   MSK: no deformity of the feet CV: no leg edema Skin:  no ulcer on the feet.  normal color and temp on the feet. Neuro: sensation is intact to touch on the feet  Lab Results  Component Value Date   HGBA1C 7.3 (A) 09/15/2019       Assessment & Plan:  Insulin-requiring type 2 DM, with CVA: this is the best control this pt should aim for, given this regimen, which does match insulin to her changing needs throughout the day Hypoglycemia: this limits aggressiveness of glycemic control CVA: as she has limited ability to self-rx, she should avoid hypoglycemia.    Patient Instructions  check your blood sugar 4 times a day: before the 3 meals, and at bedtime.  also check if you have symptoms of your blood sugar being too high or too low.  please keep a record of the readings and bring it to your next appointment here (or you can bring the meter itself).  You can write it on any piece of paper.  please call us sooner if your blood sugar goes below 70, or if you  have a lot of readings over 200.   Please reduce the Levemir to 18 units at bedtime.   Please come back for a follow-up appointment in 2 months.  Then, we'll see if we can increase the Novolog, based on your blood sugars.

## 2019-09-15 NOTE — Patient Instructions (Addendum)
check your blood sugar 4 times a day: before the 3 meals, and at bedtime.  also check if you have symptoms of your blood sugar being too high or too low.  please keep a record of the readings and bring it to your next appointment here (or you can bring the meter itself).  You can write it on any piece of paper.  please call us sooner if your blood sugar goes below 70, or if you have a lot of readings over 200.   Please reduce the Levemir to 18 units at bedtime.   Please come back for a follow-up appointment in 2 months.  Then, we'll see if we can increase the Novolog, based on your blood sugars.

## 2019-09-23 ENCOUNTER — Telehealth: Payer: BC Managed Care – PPO | Admitting: Adult Health

## 2019-09-27 ENCOUNTER — Ambulatory Visit (INDEPENDENT_AMBULATORY_CARE_PROVIDER_SITE_OTHER): Payer: BC Managed Care – PPO | Admitting: Adult Health

## 2019-09-27 ENCOUNTER — Encounter: Payer: Self-pay | Admitting: Adult Health

## 2019-09-27 ENCOUNTER — Other Ambulatory Visit: Payer: Self-pay

## 2019-09-27 VITALS — BP 109/67 | HR 84 | Temp 97.2°F | Ht 63.5 in | Wt 172.0 lb

## 2019-09-27 DIAGNOSIS — E785 Hyperlipidemia, unspecified: Secondary | ICD-10-CM

## 2019-09-27 DIAGNOSIS — I63412 Cerebral infarction due to embolism of left middle cerebral artery: Secondary | ICD-10-CM

## 2019-09-27 DIAGNOSIS — I639 Cerebral infarction, unspecified: Secondary | ICD-10-CM | POA: Diagnosis not present

## 2019-09-27 DIAGNOSIS — E1149 Type 2 diabetes mellitus with other diabetic neurological complication: Secondary | ICD-10-CM

## 2019-09-27 DIAGNOSIS — F419 Anxiety disorder, unspecified: Secondary | ICD-10-CM

## 2019-09-27 DIAGNOSIS — I10 Essential (primary) hypertension: Secondary | ICD-10-CM

## 2019-09-27 DIAGNOSIS — Z794 Long term (current) use of insulin: Secondary | ICD-10-CM

## 2019-09-27 DIAGNOSIS — R4701 Aphasia: Secondary | ICD-10-CM

## 2019-09-27 DIAGNOSIS — G250 Essential tremor: Secondary | ICD-10-CM

## 2019-09-27 DIAGNOSIS — R569 Unspecified convulsions: Secondary | ICD-10-CM

## 2019-09-27 NOTE — Patient Instructions (Signed)
Continue clopidogrel 75 mg daily  and lipitor  for secondary stroke prevention  Continue to follow up with PCP regarding cholesterol, blood pressure and diabetes management   Referral placed for neuropsych evaluation - you will be called to schedule visit  We will continue to assist with long term disability paperwork  Continue zoloft 200mg  daily and ativan as needed for ongoing depression   Continue keppra 500mg  twice daily for seizure prevention   Continue to monitor blood pressure at home  Maintain strict control of hypertension with blood pressure goal below 130/90, diabetes with hemoglobin A1c goal below 6.5% and cholesterol with LDL cholesterol (bad cholesterol) goal below 70 mg/dL. I also advised the patient to eat a healthy diet with plenty of whole grains, cereals, fruits and vegetables, exercise regularly and maintain ideal body weight.  Followup in the future with me in 6 months or call earlier if needed       Thank you for coming to see at [facility]. I hope we have been able to provide you high quality care today.  You may receive a patient satisfaction survey over the next few weeks. We would appreciate your feedback and comments so that we may continue to improve ourselves and the health of our patients.

## 2019-09-27 NOTE — Progress Notes (Signed)
[facility] [address] [place]. [place] [id number] [phone]       FOLLOW-UP NOTE  Ms. Brittany CampusDenise W Knobloch Date of Birth:  Mar 10, 1961 Medical Record Number:  [id number]   Reason for visit: Follow-up stroke  HPI:   Hospital summary: Mathis FareDenise W Sowersis an 59 y.o.femalewith a PMH of HTN, HLD, DM, Hx of prior CVA and tobacco abuse who presents to the ED as a code stroke with reports of acute onset Global Aphasia, last seen normal at approximately 09:15 this morning 12/06/2017. CT Head code stroke reveals Acute Left Insula Infarct with suspected embolus in distal Left M2. No contraindications were identified in patients history and IV tPA was administered. IR was notified and patient family was briefed by Dr Laurence SlateAroor. Risks and benefits of IR procedure was explained and family wants to proceed. NIH Stroke Scale: 10. Modified Rankin:Score=1. Patient was administered IV TPA and   She subsequently underwent mechanical thrombectomy of left M2 occlusion with TICI 2b reperfusion.gastritis sig echo was unremarkable. Transesophageal echo showed no Source of embolism. LDL cholesterol 81 mg percent. Hemoglobin A1c was 11.5. She was on aspirin prior to admission which was changed to Plavix. Patient did well and was subsequently discharged home with home physical occupational speech therapy. Patient subsequently had symptomatic bradycardia and was seen by Dr. Elberta Fortisamnitz electrophysiologist and underwent pacemaker insertion with removal of the loop recorder on 01/02/18. She has not yet found A. Fib. The patient's Plavix and Lipitor have been inadvertently discontinued following the admission for pacemaker. She states her sugars under good control. She is still on Keppra which was started when she went back on 12/13/17 to the ER with a witnessed seizure. She's had no further breakthrough seizures. She still has residual aphasia but it is improving. Her home speech therapy is recommending outpatient  speech therapy now. Physical and occupational therapy have discharged her. She is still out of work and wants FMLA paperwork filled out.  Update 04/15/2018: Patient is being seen today for follow-up and is accompanied by her two sons.  She continues to have expressive aphasia > receptive aphasia but she continues to participate in speech therapy at neuro rehab clinic.  She states some days are better than others but overall has been improving.  She does notice increased difficulty with expressive aphasia with increased stress or fatigue.  She also has been having difficulty with processing information.  She continues to take aspirin and Plavix without side effects of bleeding or bruising.  Continues to take Lipitor without side effects myalgias.  Blood pressure today satisfactory 125/70.  Patient does not monitor this at home and this was highly recommended.  She does monitor glucose levels at home which have been fluctuating but she does have an appointment scheduled on 05/04/2018 for her first visit with endocrinologist.  She continues to take Keppra 500 mg twice daily without recent seizure activity.  Patient has not returned to work at this time working at [employer] due to continued global aphasia.  Patient deals greatly with the public and on the phone with collections department and patient continues to have difficulty with expressive aphasia especially under stressful situations.  She is currently living at home with her husband, son and father who has Alzheimer's.  Patient states she was told by her cardiologist that she can start driving at this time but educated patient on [place] law stating that she is unable to drive for 6 months after seizure activity.  Patient verbalized understanding.  Patient has been going through stressful situation at home with her son, daughter-in-law and her children as far as divorce and custody battle.  Since this is been occurring, patient has had increase in  fluctuation with her expressive aphasia.  Her receptive aphasia continues to improve but does misunderstand words at times and continues to have issues with processing information.  Denies new or worsening stroke/TIA symptoms.  Update 11/02/2018 : She returns for follow-up after last visit 6 months ago.  She is accompanied by her son-in-law.  Patient has noticed worsening of her hand and voice tremor since she was taken off the primidone following her stroke.  The tremors are present at rest and do increase with action and do interfere with her day-to-day activities.  She was started recently back on primidone 2 weeks ago by primary physician and took 125 mg for 2 weeks.  She increase it 250 mg for couple of days but ran out of it and has not been taking the medicine for the last for 5 days.  She has not had any recurrent stroke or TIA symptoms.  She remains on aspirin and Plavix and tolerating it well without bleeding or bruising.  Her blood pressure is well controlled today it is 129/73.  She states her sugars work controlled and the last A1c was below 7.  She is tolerating Lipitor well without muscle aches and pains and last lipid profile checked in December last year was satisfactory.  She continues to have mild residual aphasia from the stroke but is able to communicate if she speaks slowly and deliberately.  Update 02/09/2019: Follow-up visit today for stroke and essential tremors.  She is currently on primidone 250 mg twice daily with overall improvement of her tremors.  She is now able to use utensils to feed herself without difficulty and no longer interfere with her day-to-day activity.  She is tolerating primidone without side effects.  Residual stroke deficit of expressive aphasia which has recently worsened with increased stress due to recent loss of her father and increase anxiety and worrying over pandemic and protesting.  She continues on Zoloft 200 mg daily which was previously stable.  Her anxiety  is worse at night when there is not as much going on and is unable to leave her house due to pandemic.  She becomes tearful speaking about this.  She has nonfluent speech with frequent hesitancy as she states she knows what words she wants to say but unable to get it out.  She denies any other neurological worsening.  She currently lives with her husband, son and grandchild but is able to maintain all ADLs and IADLs independently.  She continues on Keppra without seizure activity or side effects.  Continues on Plavix and atorvastatin without side effects.  Blood pressure not routinely monitored at home.  Glucose levels have been stable and continues to follow with endocrinology.  Repeat carotid duplex and transcranial Doppler on 11/12/2018 unremarkable.  No further concerns at this time.  03/11/19 visit: Ms. Mavis is a 59 year old female who is being seen today for follow up and accompanied by her son.  Discussion at prior visit regarding ongoing hearing difficulties poststroke and increased anxiety causing worsening expressive aphasia.  She did have evaluation by ENT where it was felt as though hearing difficulties are more related to communication deficit with processing of information. Anxiety has improved with use of ativan 0.5mg . she has used approx 4 times over the past month without reported side  effects.  She also endorses overall improvement of her speech with at times waxing/waning but overall large improvement.  She also continues on sertraline 200 mg daily. Tremors has been stable with ongoing use of primidone 250 mg twice daily- able to do more ADLs with eating and writing Keppra 500 mg twice daily without recurrent seizure activity Continues on clopidogrel and atorvastatin without reported side effects for secondary stroke prevention Blood pressure today satisfactory 124/75 Glucose levels have been stable No further concerns at this time   Update 06/24/19 AL:  Brittany [name] is a 59 y.o.  female here today for stroke follow up. She feels that she is doing well. She feels that speech is better. She continues primidone for tremors, keppra for seizure prophylaxis, Plavix and atorvastatin for stroke prevention. She feels tremor is stable. No seizure activity. She is followed closely by PCP for DM and HLD management. Last A1C was 7.6 in 03/2019. Do not have current lipid panel. She continues to suffer from depression. She continues Zoloft 200mg  daily and Ativan as needed. She reports taking it 3-4 times since last being seen by Janett Billow.   Update 09/27/2019: Ms. Gagen is a 59 year old female who is being seen today for stroke follow-up.  Residual deficits of expressive aphasia and right hemiparesis worsened with increased stressors or fatigue.  She continues to receive disability due to ongoing deficits and is requesting additional information to be filled out today regarding LTD cognitive assessment form.  She continues to require assistance bathing and dressing as well as having difficulty with reading, writing, comprehension and executive functioning.  MMSE today 22/30.  She continues on Plavix and atorvastatin for secondary stroke prevention without side effects.  Blood pressure today 109/67.  Glucose levels stable.  Continues on Keppra 500 mg twice daily tolerating well without any reoccurring seizure activity.  Remains on primidone 250 mg twice daily for underlying essential tremors previously interfering with ADLs.  She continues to have difficulty with ongoing anxiety and depression despite use of sertraline 200 mg daily and lorazepam as needed.  Denies new or worsening stroke/TIA symptoms.    ROS:   14 system review of systems is positive for speech difficulty, anxiety and tremor and all other systems negative   PMH:  Past Medical History:  Diagnosis Date  . Chronic lower back pain   . Depression   . Fibromyalgia   . High cholesterol   . Seizures ([facility]) 11/2017   "day after she  came home from hospital after having stroke" (01/01/2018)  . Stroke [facility]) 12/06/2017   "speech issues since" (01/01/2018)  . Type II diabetes mellitus ([place])     Social History:  Social History   Socioeconomic History  . Marital status: Married    Spouse name: [name]  . Number of children: 2  . Years of education: 27  . Highest education level: Not on file  Occupational History  . Not on file  Tobacco Use  . Smoking status: Former Smoker    Packs/day: 1.00    Years: 38.00    Pack years: 38.00    Types: Cigarettes    Quit date: 12/06/2017    Years since quitting: 1.8  . Smokeless tobacco: Never Used  Substance and Sexual Activity  . Alcohol use: Yes    Comment: 01/01/2018 "nothing since stroke 11/2017"  . Drug use: No  . Sexual activity: Not Currently  Other Topics Concern  . Not on file  Social History Narrative   Patient is married (  [name]) and lives at home with her family   Patient has two children.   Patient works at [employer].   Patient has a college education.   Patient is right-handed.   Patient drinks 3 cups of coffee M-F.      Social Determinants of Health   Financial Resource Strain:   . Difficulty of Paying Living Expenses: Not on file  Food Insecurity:   . Worried About Programme researcher, broadcasting/film/videounning Out of Food in the Last Year: Not on file  . Ran Out of Food in the Last Year: Not on file  Transportation Needs:   . Lack of Transportation (Medical): Not on file  . Lack of Transportation (Non-Medical): Not on file  Physical Activity:   . Days of Exercise per Week: Not on file  . Minutes of Exercise per Session: Not on file  Stress:   . Feeling of Stress : Not on file  Social Connections:   . Frequency of Communication with Friends and Family: Not on file  . Frequency of Social Gatherings with Friends and Family: Not on file  . Attends Religious Services: Not on file  . Active Member of Clubs or Organizations: Not on file  . Attends BankerClub or Organization Meetings: Not  on file  . Marital Status: Not on file  Intimate Partner Violence:   . Fear of Current or Ex-Partner: Not on file  . Emotionally Abused: Not on file  . Physically Abused: Not on file  . Sexually Abused: Not on file    Medications:   Current Outpatient Medications on File Prior to Visit  Medication Sig Dispense Refill  . acetaminophen (TYLENOL) 500 MG tablet Take 1,000 mg by mouth every 6 (six) hours as needed for fever (pain).    Marland Kitchen. atorvastatin (LIPITOR) 40 MG tablet Take 1 tablet (40 mg total) by mouth daily. 90 tablet 1  . B Complex-Biotin-FA (MULTI-B COMPLEX) CAPS Take one a day ,B complex  multivitamin over the counter or use prenatal vitamin. 90 capsule 0  . clopidogrel (PLAVIX) 75 MG tablet Take 1 tablet (75 mg total) by mouth daily. 90 tablet 3  . insulin aspart (NOVOLOG FLEXPEN) 100 UNIT/ML FlexPen 3 times a day (just before each meal), 04-20-10, and pen needles 4/day 15 mL 11  . Insulin Detemir (LEVEMIR FLEXTOUCH) 100 UNIT/ML Pen Inject 18 Units into the skin at bedtime. 45 mL 1  . Insulin Pen Needle (BD PEN NEEDLE NANO U/F) 32G X 4 MM MISC 1 each by Other route 4 (four) times daily. 120 each 2  . irbesartan (AVAPRO) 150 MG tablet TAKE 1 TABLET(150 MG) BY MOUTH DAILY 90 tablet 3  . levETIRAcetam (KEPPRA) 500 MG tablet Take 1 tablet (500 mg total) by mouth 2 (two) times daily. 180 tablet 4  . LORazepam (ATIVAN) 0.5 MG tablet Take 0.5-1 tablets (0.25-0.5 mg total) by mouth 2 (two) times daily as needed for anxiety. 45 tablet 0  . primidone (MYSOLINE) 250 MG tablet Take 1 tablet (250 mg total) by mouth 2 (two) times daily. Start 1 tablet at bedtime for 2 weeks and increase if tolerated to 1 tablet twice daily 60 tablet 3  . sertraline (ZOLOFT) 100 MG tablet Take 2 tablets (200 mg total) by mouth at bedtime. 180 tablet 3   No current facility-administered medications on file prior to visit.    Allergies:  No Known Allergies  Today's Vitals   09/27/19 0946  BP: 109/67  Pulse: 84    Temp: (!) 97.2 F (  36.2 C)  Weight: 172 lb (78 kg)  Height: 5' 3.5" (1.613 m)   Body mass index is 29.99 kg/m.   General: well developed, well nourished,  pleasant middle-age Caucasian female, seated, in no evident distress Head: head normocephalic and atraumatic.   Neck: supple with no carotid or supraclavicular bruits Cardiovascular: regular rate and rhythm, no murmurs Musculoskeletal: no deformity Skin:  no rash/petichiae Vascular:  Normal pulses all extremities   Neurologic Exam Mental Status: Awake and fully alert.   Mild to moderate expressive aphasia with word hesitancy.  Oriented to place and time. Recent and remote memory impaired. Attention span, concentration and fund of knowledge impaired. Mood and affect appropriate through majority of visit but did become tearful intermittently.   MMSE - Mini Mental State Exam 09/27/2019  Orientation to time 5  Orientation to Place 4  Registration 3  Attention/ Calculation 0  Recall 2  Language- name 2 objects 2  Language- repeat 0  Language- follow 3 step command 3  Language- read & follow direction 1  Write a sentence 1  Copy design 1  Copy design-comments named 6 animals.  Total score 22   Cranial Nerves: Pupils equal, briskly reactive to light. Extraocular movements full without nystagmus. Visual fields full to confrontation. Hearing intact. Facial sensation intact. Face, tongue, palate moves normally and symmetrically.  Motor: Normal bulk and tone. Normal strength in all tested extremity muscles.  Subjective right hemiparesis but unable to appreciate during visit Sensory.: intact to touch , pinprick , position and vibratory sensation.  Coordination: Mild action tremors noted in bilateral upper extremities. rapid alternating movements normal in all extremities. Finger-to-nose and heel-to-shin positive for ataxia right side. Gait and Station: Arises from chair without difficulty. Stance is normal. Gait demonstrates normal  stride length and balance Reflexes: 1+ and symmetric. Toes downgoing.     IMAGING:  VAS US CAROTID DUPLEX BILATERAL 11/12/2018 Summary: Right Carotid: Velocities in the right ICA are consistent with a 1-39% stenosis. Left Carotid: Velocities in the left ICA are consistent with a 1-39% stenosis. Vertebrals:  Bilateral vertebral arteries demonstrate antegrade flow. Subclavians: Normal flow hemodynamics were seen in bilateral subclavian arteries.  VAS [place] TRANSCRANIAL DOPPLER 11/12/2018 Summary: Absent bitemporal windows limits evaluation of anterior circulation . Normal mean flow velocities in both opthalmics, left carotid siphon and posterior circulation vessels.    ASSESSMENT: 33-59 year old lady with aphasia secondary to left MCA infarct in April 2019 of cryptogenic etiology treated with IV TPA followed by mechanical thrombectomy with good recanalization. She also had symptomatic seizure after discharge and residual stroke deficit of expressive aphasia and cognitive impairment currently receiving long-term disability.  Restarted on primidone due to essential tremors which has been stable.  Largely benefited from use of lorazepam as needed for increased anxiety which led to worsening of expressive aphasia.    PLAN: -Continue Ativan 0.25 mg - 0.5 mg twice daily as needed for situational anxiety and increased stress with worsening neurological symptoms.  Also advised to continue sertraline 200 mg daily currently managed by PCP -Referral placed to neuropsych for underlying cognitive impairment and ongoing depression/anxiety worsened post stroke -We will continue to assist patient with long-term disability paperwork as well as completing LCD cognitive assessment form -Continue primidone 250 mg twice daily for essential tremors -Continue Keppra 500 mg twice daily for seizure prophylaxis -Continue Plavix and atorvastatin for secondary stroke prevention -Continue to follow with PCP for HTN, HLD  and DM management -Continue strict control of hypertension with blood pressure goal  below 130/90, lipids with LDL cholesterol goal below 70 mg percent and diabetes with hemoglobin A1c goal below 7.  She was advised to eat a healthy diet with lots of fruits, vegetables, cereals and whole grains and to be active and exercise 30 minutes every day.    Follow-up in 6 months months or call earlier if needed  Greater than 50% of time during this 35 minute visit was spent on discussing increased stress/anxiety with worsening of neurological symptoms with ongoing stressors, residual expressive aphasia, essential tremors and importance of stroke risk factor management, planning of further management, discussion with patient and family and coordination of care  George Hugh, AGNP-BC  [facility] [address] [place], [place] [address]  Phone [phone] Fax [phone] Note: This document was prepared with digital dictation and possible smart phrase technology. Any transcriptional errors that result from this process are unintentional.

## 2019-09-28 NOTE — Progress Notes (Signed)
I agree with the above plan 

## 2019-09-28 NOTE — Telephone Encounter (Signed)
Received signed forms, to MR.

## 2019-10-05 ENCOUNTER — Ambulatory Visit (INDEPENDENT_AMBULATORY_CARE_PROVIDER_SITE_OTHER): Payer: BC Managed Care – PPO | Admitting: *Deleted

## 2019-10-05 DIAGNOSIS — R55 Syncope and collapse: Secondary | ICD-10-CM | POA: Diagnosis not present

## 2019-10-06 LAB — CUP PACEART REMOTE DEVICE CHECK
Battery Remaining Longevity: 163 mo
Battery Voltage: 3.04 V
Brady Statistic AP VP Percent: 2.18 %
Brady Statistic AP VS Percent: 0.93 %
Brady Statistic AS VP Percent: 0.35 %
Brady Statistic AS VS Percent: 96.54 %
Brady Statistic RA Percent Paced: 3.15 %
Brady Statistic RV Percent Paced: 2.53 %
Date Time Interrogation Session: 20210125234047
Implantable Lead Implant Date: 20190425
Implantable Lead Implant Date: 20190425
Implantable Lead Location: 753859
Implantable Lead Location: 753860
Implantable Lead Model: 5076
Implantable Lead Model: 5076
Implantable Pulse Generator Implant Date: 20190425
Lead Channel Impedance Value: 361 Ohm
Lead Channel Impedance Value: 418 Ohm
Lead Channel Impedance Value: 494 Ohm
Lead Channel Impedance Value: 532 Ohm
Lead Channel Pacing Threshold Amplitude: 0.625 V
Lead Channel Pacing Threshold Amplitude: 0.875 V
Lead Channel Pacing Threshold Pulse Width: 0.4 ms
Lead Channel Pacing Threshold Pulse Width: 0.4 ms
Lead Channel Sensing Intrinsic Amplitude: 1.25 mV
Lead Channel Sensing Intrinsic Amplitude: 1.25 mV
Lead Channel Sensing Intrinsic Amplitude: 17.875 mV
Lead Channel Sensing Intrinsic Amplitude: 17.875 mV
Lead Channel Setting Pacing Amplitude: 1.75 V
Lead Channel Setting Pacing Amplitude: 2.5 V
Lead Channel Setting Pacing Pulse Width: 0.4 ms
Lead Channel Setting Sensing Sensitivity: 2 mV

## 2019-10-28 ENCOUNTER — Other Ambulatory Visit: Payer: Self-pay | Admitting: Family Medicine

## 2019-10-28 NOTE — Telephone Encounter (Signed)
Requested medication (s) are due for refill today:   Yes  Requested medication (s) are on the active medication list:   Yes  Future visit scheduled:   No   Last ordered: 09/03/2018  #90  3 refills.       Returned because failed protocol    Requested Prescriptions  Pending Prescriptions Disp Refills   clopidogrel (PLAVIX) 75 MG tablet [Pharmacy Med Name: CLOPIDOGREL 75MG  TABLETS] 90 tablet 3    Sig: TAKE 1 TABLET(75 MG) BY MOUTH DAILY      Hematology: Antiplatelets - clopidogrel Failed - 10/28/2019  1:39 PM      Failed - Evaluate AST, ALT within 2 months of therapy initiation.      Failed - ALT in normal range and within 360 days    ALT  Date Value Ref Range Status  08/28/2018 13 0 - 32 IU/L Final          Failed - AST in normal range and within 360 days    AST  Date Value Ref Range Status  08/28/2018 14 0 - 40 IU/L Final          Failed - HCT in normal range and within 180 days    HCT  Date Value Ref Range Status  01/01/2018 39.9 36.0 - 46.0 % Final   Hematocrit  Date Value Ref Range Status  08/28/2016 40.1 34.0 - 46.6 % Final          Failed - HGB in normal range and within 180 days    Hemoglobin  Date Value Ref Range Status  01/01/2018 13.4 12.0 - 15.0 g/dL Final  01/03/2018 09/73/5329 11.1 - 15.9 g/dL Final          Failed - PLT in normal range and within 180 days    Platelets  Date Value Ref Range Status  01/01/2018 210 150 - 400 K/uL Final    Comment:    Performed at [facility], [address] [place], [place]  08/28/2016 245 150 - 379 x10E3/uL Final   Platelet Count, POC  Date Value Ref Range Status  07/30/2017 257 142 - 424 K/uL Final          Failed - Valid encounter within last 6 months    Recent Outpatient Visits           6 months ago Moderate episode of recurrent major depressive disorder [facility])   Primary Care at [facility], Oneita Jolly, MD   1 year ago Urine WBC increased   Primary Care at [facility], Etta Grandchild, MD   1 year  ago Type 2 diabetes mellitus with other neurologic complication, with long-term current use of insulin [facility])   Primary Care at [facility], Etta Grandchild, MD   1 year ago Type 2 diabetes mellitus with other neurologic complication, with long-term current use of insulin [facility])   Primary Care at [facility], Etta Grandchild, MD   1 year ago 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

## 2019-11-08 ENCOUNTER — Telehealth: Payer: Self-pay

## 2019-11-08 NOTE — Telephone Encounter (Signed)
Pt left a VM requesting a call back to discuss her medications that Shanda Bumps, NP is prescribing. She can be reached at [phone].

## 2019-11-09 ENCOUNTER — Other Ambulatory Visit: Payer: Self-pay | Admitting: Family Medicine

## 2019-11-09 NOTE — Telephone Encounter (Signed)
Requested medication (s) are due for refill today: clopidogrel, yes  Requested medication (s) are on the active medication list: yes  Last refill: 11/01/19  Future visit scheduled:no  Notes to clinic:  no valid encounter within last 6 months  Requested medication (s) are due for refill today: Avapro, yes  Requested medication (s) are on the active medication list: yes  Last refill: ?  Future visit scheduled: no  Notes to clinic:  no valid encounter within last 6 months Requested Prescriptions  Pending Prescriptions Disp Refills   clopidogrel (PLAVIX) 75 MG tablet 90 tablet 3    Sig: Take 1 tablet (75 mg total) by mouth daily.      Hematology: Antiplatelets - clopidogrel Failed - 11/09/2019  1:45 PM      Failed - Evaluate AST, ALT within 2 months of therapy initiation.      Failed - ALT in normal range and within 360 days    ALT  Date Value Ref Range Status  08/28/2018 13 0 - 32 IU/L Final          Failed - AST in normal range and within 360 days    AST  Date Value Ref Range Status  08/28/2018 14 0 - 40 IU/L Final          Failed - HCT in normal range and within 180 days    HCT  Date Value Ref Range Status  01/01/2018 39.9 36.0 - 46.0 % Final   Hematocrit  Date Value Ref Range Status  08/28/2016 40.1 34.0 - 46.6 % Final          Failed - HGB in normal range and within 180 days    Hemoglobin  Date Value Ref Range Status  01/01/2018 13.4 12.0 - 15.0 g/dL Final  08/28/2016 13.2 11.1 - 15.9 g/dL Final          Failed - PLT in normal range and within 180 days    Platelets  Date Value Ref Range Status  01/01/2018 210 150 - 400 K/uL Final    Comment:    Performed at [facility], [place] [address], [place], [place] [address]  08/28/2016 245 150 - 379 x10E3/uL Final   Platelet Count, POC  Date Value Ref Range Status  07/30/2017 257 142 - 424 K/uL Final          Failed - Valid encounter within last 6 months    Recent Outpatient Visits           6  months ago Moderate episode of recurrent major depressive disorder [facility] 2)   Primary Care at [facility], Lilia Argue, MD   1 year ago Urine WBC increased   Primary Care at [facility], Laurey Arrow, MD   1 year ago Type 2 diabetes mellitus with other neurologic complication, with long-term current use of insulin [facility])   Primary Care at [facility], Laurey Arrow, MD   1 year ago Type 2 diabetes mellitus with other neurologic complication, with long-term current use of insulin [facility])   Primary Care at [facility], Laurey Arrow, MD   1 year ago 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 [facility]) s/p IV tPA and mechanical intervention   Primary Care at [facility], Laurey Arrow, MD                irbesartan (AVAPRO) 150 MG tablet 90 tablet 3    Sig: TAKE 1 TABLET(150 MG) BY MOUTH DAILY      Cardiovascular:  Angiotensin Receptor Blockers Failed - 11/09/2019  1:45 PM      Failed - Cr in normal range and within 180 days    Creat  Date Value Ref Range Status  07/13/2016 0.59 0.50 - 1.05 mg/dL Final    Comment:      For patients > or = 62 years of age: The upper reference limit for Creatinine is approximately 13% higher for people identified as African-American.      Creatinine, Ser  Date Value Ref Range Status  08/28/2018 0.78 0.57 - 1.00 mg/dL Final   Creatinine, Urine  Date Value Ref Range Status  07/13/2016 146 20 - 320 mg/dL Final          Failed - K in normal range and within 180 days    Potassium  Date Value Ref Range Status  08/28/2018 4.5 3.5 - 5.2 mmol/L Final          Failed - Valid encounter within last 6 months    Recent Outpatient Visits           6 months ago Moderate episode of recurrent major depressive disorder [facility])   Primary Care at [facility], Meda Coffee, MD   1 year ago Urine WBC increased   Primary Care at [facility], Levell July, MD   1 year ago Type 2 diabetes mellitus with other neurologic complication, with long-term current use of insulin [facility])   Primary Care at  [facility], Levell July, MD   1 year ago Type 2 diabetes mellitus with other neurologic complication, with long-term current use of insulin 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

## 2019-11-09 NOTE — Telephone Encounter (Signed)
I spoke to pt and husband,  Asking for refill on plavix and irbesartan.  These have been filled by pcp.  I recommend to call pcp, Dr Koren Shiver, but will also send message to her as well.

## 2019-11-09 NOTE — Telephone Encounter (Signed)
Copied from CRM [id number]. Topic: Quick Communication - Rx Refill/Question >> Nov 09, 2019  1:40 PM Jaquita Rector A wrote: Medication: clopidogrel (PLAVIX) 75 MG tablet, irbesartan (AVAPRO) 150 MG tablet   Has the patient contacted their pharmacy? Yes.   (Agent: If no, request that the patient contact the pharmacy for the refill.) (Agent: If yes, when and what did the pharmacy advise?)  Preferred Pharmacy (with phone number or street name): [id number] - [place], [place] - [address] AT [place] [address]  Phone:  [phone] Fax:  [phone]     Agent: Please be advised that RX refills may take up to 3 business days. We ask that you follow-up with your pharmacy.

## 2019-11-10 NOTE — Telephone Encounter (Signed)
Pt needs to make a f.u appointment before refills.   Attempted to call pt no answer.    Pt last seen on 04/29/20 Tele-med. Unrelated to the medications requested.   Are refills appropriate?

## 2019-11-11 MED ORDER — IRBESARTAN 150 MG PO TABS: ORAL_TABLET | ORAL | 3 refills | Status: DC

## 2019-11-11 MED ORDER — CLOPIDOGREL BISULFATE 75 MG PO TABS: 75.0000 mg | ORAL_TABLET | Freq: Every day | ORAL | 3 refills | Status: DC

## 2019-11-11 NOTE — Telephone Encounter (Signed)
Meds refilled.

## 2019-11-11 NOTE — Telephone Encounter (Signed)
Called patient to schedule appointment , she is scheduled for 3/15 @11 

## 2019-11-12 ENCOUNTER — Other Ambulatory Visit: Payer: Self-pay

## 2019-11-16 ENCOUNTER — Other Ambulatory Visit: Payer: Self-pay

## 2019-11-16 ENCOUNTER — Ambulatory Visit (INDEPENDENT_AMBULATORY_CARE_PROVIDER_SITE_OTHER): Payer: BC Managed Care – PPO | Admitting: Endocrinology

## 2019-11-16 ENCOUNTER — Encounter: Payer: Self-pay | Admitting: Endocrinology

## 2019-11-16 VITALS — BP 140/70 | HR 98 | Ht 63.5 in | Wt 173.6 lb

## 2019-11-16 DIAGNOSIS — Z794 Long term (current) use of insulin: Secondary | ICD-10-CM

## 2019-11-16 DIAGNOSIS — E1149 Type 2 diabetes mellitus with other diabetic neurological complication: Secondary | ICD-10-CM

## 2019-11-16 LAB — POCT GLYCOSYLATED HEMOGLOBIN (HGB A1C): Hemoglobin A1C: 6.5 % — AB (ref 4.0–5.6)

## 2019-11-16 MED ORDER — NOVOLOG FLEXPEN 100 UNIT/ML ~~LOC~~ SOPN: PEN_INJECTOR | SUBCUTANEOUS | 11 refills | Status: DC

## 2019-11-16 MED ORDER — TRUE METRIX BLOOD GLUCOSE TEST VI STRP: 1.0000 | ORAL_STRIP | Freq: Four times a day (QID) | 3 refills | Status: DC

## 2019-11-16 MED ORDER — BD PEN NEEDLE NANO U/F 32G X 4 MM MISC: 1.0000 | Freq: Four times a day (QID) | 2 refills | Status: DC

## 2019-11-16 NOTE — Patient Instructions (Addendum)
check your blood sugar 4 times a day: before the 3 meals, and at bedtime.  also check if you have symptoms of your blood sugar being too high or too low.  please keep a record of the readings and bring it to your next appointment here (or you can bring the meter itself).  You can write it on any piece of paper.  please call us sooner if your blood sugar goes below 70, or if you have a lot of readings over 200.   Please reduce the lunch Novolog to 10 units.   Please come back for a follow-up appointment in 3 months.

## 2019-11-16 NOTE — Progress Notes (Signed)
Subjective:    Patient ID: Brittany [name], female    DOB: [birth year], 59 y.o.   MRN: [id number]  HPI Pt returns for f/u of diabetes mellitus: DM type: Insulin-requiring type 2 Dx'ed: 0175 Complications: CVA Therapy: insulin since early 2019 GDM: 1987 DKA: never Severe hypoglycemia: never Pancreatitis: never Pancreatic imaging: normal on 2019 CT.   Brittany [name]: son gives pt her insulin (due to CVA--he says he knows how to use syringe and vial).   Other: she gets multiple daily injections.   Interval history: she brings a record of her cbg's which I have reviewed today.  cbg varies from 65-212.  it is lowest in the afternoon.  She never misses the insulin.  She seldom has hypoglycemia, and these episodes are mild.  Past Medical History:  Diagnosis Date  . Chronic lower back pain   . Depression   . Fibromyalgia   . High cholesterol   . Seizures (Brittany [name]) 11/2017   "day after she came home from hospital after having stroke" (01/01/2018)  . Stroke [facility]) 12/06/2017   "speech issues since" (01/01/2018)  . Type II diabetes mellitus ([place])     Past Surgical History:  Procedure Laterality Date  . BACK SURGERY    . CHOLECYSTECTOMY    . INSERT / REPLACE / REMOVE PACEMAKER  01/01/2018  . IR CT HEAD LTD  12/06/2017  . IR PERCUTANEOUS ART THROMBECTOMY/INFUSION INTRACRANIAL INC DIAG ANGIO  12/06/2017  . LOOP RECORDER INSERTION N/A 12/12/2017   Procedure: LOOP RECORDER INSERTION;  Surgeon: Constance Haw, MD;  Location: [place] [facility];  Service: Cardiovascular;  Laterality: N/A;  . LOOP RECORDER REMOVAL  01/01/2018  . LOOP RECORDER REMOVAL N/A 01/01/2018   Procedure: LOOP RECORDER REMOVAL;  Surgeon: Constance Haw, MD;  Location: [place] [facility];  Service: Cardiovascular;  Laterality: N/A;  . PACEMAKER IMPLANT N/A 01/01/2018   Procedure: PACEMAKER IMPLANT;  Surgeon: Constance Haw, MD;  Location: [place] [facility];  Service: Cardiovascular;  Laterality: N/A;  . RADIOLOGY WITH  ANESTHESIA N/A 12/06/2017   Procedure: CODE STROKE;  Surgeon: Luanne Bras, MD;  Location: [place];  Service: Radiology;  Laterality: N/A;  . TEE WITHOUT CARDIOVERSION N/A 12/10/2017   Procedure: TRANSESOPHAGEAL ECHOCARDIOGRAM (TEE);  Surgeon: Jerline Pain, MD;  Location: [facility];  Service: Cardiovascular;  Laterality: N/A;  . THROMBECTOMY FEMORAL ARTERY Right 12/07/2017   Procedure: THROMBECTOMY RIGHT FEMORAL ARTERY;  Surgeon: Waynetta Sandy, MD;  Location: [place];  Service: Vascular;  Laterality: Right;  . TONSILLECTOMY      Social History   Socioeconomic History  . Marital status: Married    Spouse name: Brittany [name]  . Number of children: 2  . Years of education: 66  . Highest education level: Not on file  Occupational History  . Not on file  Tobacco Use  . Smoking status: Former Smoker    Packs/day: 1.00    Years: 38.00    Pack years: 38.00    Types: Cigarettes    Quit date: 12/06/2017    Years since quitting: 1.9  . Smokeless tobacco: Never Used  Substance and Sexual Activity  . Alcohol use: Yes    Comment: 01/01/2018 "nothing since stroke 11/2017"  . Drug use: No  . Sexual activity: Not Currently  Other Topics Concern  . Not on file  Social History Narrative   Patient is married (Brittany [name]) and lives at home with her family   Patient has two children.   Patient works  at [employer].   Patient has a college education.   Patient is right-handed.   Patient drinks 3 cups of coffee M-F.      Social Determinants of Health   Financial Resource Strain:   . Difficulty of Paying Living Expenses:   Food Insecurity:   . Worried About Programme researcher, broadcasting/film/video in the Last Year:   . Barista in the Last Year:   Transportation Needs:   . Freight forwarder (Medical):   Marland Kitchen Lack of Transportation (Non-Medical):   Physical Activity:   . Days of Exercise per Week:   . Minutes of Exercise per Session:   Stress:   . Feeling of Stress :   Social Connections:    . Frequency of Communication with Friends and Family:   . Frequency of Social Gatherings with Friends and Family:   . Attends Religious Services:   . Active Member of Clubs or Organizations:   . Attends Banker Meetings:   Marland Kitchen Marital Status:   Intimate Partner Violence:   . Fear of Current or Ex-Partner:   . Emotionally Abused:   Marland Kitchen Physically Abused:   . Sexually Abused:     Current Outpatient Medications on File Prior to Visit  Medication Sig Dispense Refill  . acetaminophen (TYLENOL) 500 MG tablet Take 1,000 mg by mouth every 6 (six) hours as needed for fever (pain).    Marland Kitchen atorvastatin (LIPITOR) 40 MG tablet Take 1 tablet (40 mg total) by mouth daily. 90 tablet 1  . B Complex-Biotin-FA (MULTI-B COMPLEX) CAPS Take one a day ,B complex  multivitamin over the counter or use prenatal vitamin. 90 capsule 0  . clopidogrel (PLAVIX) 75 MG tablet Take 1 tablet (75 mg total) by mouth daily. 90 tablet 3  . Insulin Detemir (LEVEMIR FLEXTOUCH) 100 UNIT/ML Pen Inject 18 Units into the skin at bedtime. 45 mL 1  . irbesartan (AVAPRO) 150 MG tablet TAKE 1 TABLET(150 MG) BY MOUTH DAILY 90 tablet 3  . levETIRAcetam (KEPPRA) 500 MG tablet Take 1 tablet (500 mg total) by mouth 2 (two) times daily. 180 tablet 4  . LORazepam (ATIVAN) 0.5 MG tablet Take 0.5-1 tablets (0.25-0.5 mg total) by mouth 2 (two) times daily as needed for anxiety. 45 tablet 0  . primidone (MYSOLINE) 250 MG tablet Take 1 tablet (250 mg total) by mouth 2 (two) times daily. Start 1 tablet at bedtime for 2 weeks and increase if tolerated to 1 tablet twice daily 60 tablet 3  . sertraline (ZOLOFT) 100 MG tablet Take 2 tablets (200 mg total) by mouth at bedtime. 180 tablet 3   No current facility-administered medications on file prior to visit.    No Known Allergies  Family History  Problem Relation Age of Onset  . Cancer Mother   . Diabetes Mother   . Heart disease Mother   . Parkinsonism Mother   . Diabetes Father   .  Heart disease Father   . Dementia Father   . Cancer Maternal Grandmother 62       lung  . COPD Maternal Aunt     BP 140/70 (BP Location: Right Arm, Patient Position: Sitting, Cuff Size: Large)   Pulse 98   Ht 5' 3.5" (1.613 m)   Wt 173 lb 9.6 oz (78.7 kg)   SpO2 97%   BMI 30.27 kg/m    Review of Systems Denies LOC.     Objective:   Physical Exam VITAL SIGNS:  See  vs page GENERAL: no distress Pulses: dorsalis pedis intact bilat.   MSK: no deformity of the feet CV: no leg edema Skin:  no ulcer on the feet.  normal color and temp on the feet. Neuro: sensation is intact to touch on the feet  Lab Results  Component Value Date   HGBA1C 6.5 (A) 11/16/2019       Assessment & Plan:  Insulin-requiring type 2 DM, with CVA: overcontrolled.  Hypoglycemia: this limits aggressiveness of glycemic control.  SDOH: she is not a candidate for aggressive glycemic control.    Patient Instructions  check your blood sugar 4 times a day: before the 3 meals, and at bedtime.  also check if you have symptoms of your blood sugar being too high or too low.  please keep a record of the readings and bring it to your next appointment here (or you can bring the meter itself).  You can write it on any piece of paper.  please call us sooner if your blood sugar goes below 70, or if you have a lot of readings over 200.   Please reduce the lunch Novolog to 10 units.   Please come back for a follow-up appointment in 3 months.

## 2019-11-22 ENCOUNTER — Ambulatory Visit: Payer: BC Managed Care – PPO | Admitting: Family Medicine

## 2019-11-22 ENCOUNTER — Encounter: Payer: Self-pay | Admitting: Family Medicine

## 2019-11-22 ENCOUNTER — Other Ambulatory Visit: Payer: Self-pay

## 2019-11-22 VITALS — BP 126/70 | HR 84 | Temp 98.5°F | Ht 63.0 in | Wt 174.8 lb

## 2019-11-22 DIAGNOSIS — E785 Hyperlipidemia, unspecified: Secondary | ICD-10-CM | POA: Diagnosis not present

## 2019-11-22 DIAGNOSIS — I10 Essential (primary) hypertension: Secondary | ICD-10-CM | POA: Diagnosis not present

## 2019-11-22 DIAGNOSIS — E1149 Type 2 diabetes mellitus with other diabetic neurological complication: Secondary | ICD-10-CM | POA: Diagnosis not present

## 2019-11-22 DIAGNOSIS — F331 Major depressive disorder, recurrent, moderate: Secondary | ICD-10-CM | POA: Diagnosis not present

## 2019-11-22 DIAGNOSIS — Z794 Long term (current) use of insulin: Secondary | ICD-10-CM

## 2019-11-22 DIAGNOSIS — I6930 Unspecified sequelae of cerebral infarction: Secondary | ICD-10-CM

## 2019-11-22 LAB — LIPID PANEL
Chol/HDL Ratio: 4.4 ratio (ref 0.0–4.4)
Cholesterol, Total: 239 mg/dL — ABNORMAL HIGH (ref 100–199)
HDL: 54 mg/dL (ref >39)
LDL Chol Calc (NIH): 158 mg/dL — ABNORMAL HIGH (ref 0–99)
Triglycerides: 148 mg/dL (ref 0–149)
VLDL Cholesterol Cal: 27 mg/dL (ref 5–40)

## 2019-11-22 LAB — COMPREHENSIVE METABOLIC PANEL
ALT: 10 IU/L (ref 0–32)
AST: 14 IU/L (ref 0–40)
Albumin/Globulin Ratio: 2 (ref 1.2–2.2)
Albumin: 4.5 g/dL (ref 3.8–4.9)
Alkaline Phosphatase: 133 IU/L — ABNORMAL HIGH (ref 39–117)
BUN/Creatinine Ratio: 17 (ref 12–28)
BUN: 11 mg/dL (ref 8–27)
Bilirubin Total: 0.3 mg/dL (ref 0.0–1.2)
CO2: 20 mmol/L (ref 20–29)
Calcium: 9.5 mg/dL (ref 8.7–10.3)
Chloride: 105 mmol/L (ref 96–106)
Creatinine, Ser: 0.65 mg/dL (ref 0.57–1.00)
GFR calc Af Amer: 112 mL/min/{1.73_m2} (ref >59)
GFR calc non Af Amer: 97 mL/min/{1.73_m2} (ref >59)
Globulin, Total: 2.3 g/dL (ref 1.5–4.5)
Glucose: 108 mg/dL — ABNORMAL HIGH (ref 65–99)
Potassium: 4.3 mmol/L (ref 3.5–5.2)
Sodium: 139 mmol/L (ref 134–144)
Total Protein: 6.8 g/dL (ref 6.0–8.5)

## 2019-11-22 MED ORDER — ATORVASTATIN CALCIUM 40 MG PO TABS: 40.0000 mg | ORAL_TABLET | Freq: Every day | ORAL | 3 refills | Status: DC

## 2019-11-22 NOTE — Progress Notes (Signed)
3/15/202111:10 AM  [name] 1960/10/03, 59 y.o., female [id number]  Chief Complaint  Patient presents with  . Gynecologic Exam    with med refills   . Diabetes    HPI:   Patient is a 59 y.o. female with past medical history significant for embolic stroke with dysphagia, sick sinus syndrome with pacemaker in place, DM2 on insulin, essential tremors, depression  who presents today for routine followup  Last OV aug 2020 - telemedicine, no changes  Reports that overall doing ok Grieving death of father Raising 3 small grandchildren, mother having addiction issues, they keep her busy Saw endo last week, a1c 6.5 Cont to see neuro regularly Needs refills of atorvastatin and sertraline Reports that despite grief, depression is well controlled PHQ2 noted She does not have any acute concerns today   Patient Care Team: Myles Lipps, MD as PCP - General (Family Medicine) Tressie Stalker, MD as Consulting Physician (Neurosurgery) Ihor Austin, NP as Nurse Practitioner (Neurology) Romero Belling, MD as Consulting Physician (Endocrinology) Micki Riley, MD as Consulting Physician (Neurology) Regan Lemming, MD as Consulting Physician (Cardiology)   Depression screen [facility] 2/9 11/22/2019 09/03/2018 02/26/2018  Decreased Interest 0 0 (No Data)  Down, Depressed, Hopeless 0 0 -  PHQ - 2 Score 0 0 -  Altered sleeping - - -  Tired, decreased energy - - -  Change in appetite - - -  Feeling bad or failure about yourself  - - -  Trouble concentrating - - -  Moving slowly or fidgety/restless - - -  Suicidal thoughts - - -  PHQ-9 Score - - -  Difficult doing work/chores - - -    Fall Risk  11/22/2019 09/03/2018 02/26/2018 01/23/2018 01/13/2018  Falls in the past year? 0 0 No Yes No  Number falls in past yr: 0 0 - 1 -  Injury with Fall? 0 0 No No -  Follow up Falls evaluation completed - - Falls evaluation completed -     No Known Allergies  Prior to Admission  medications   Medication Sig Start Date End Date Taking? Authorizing Provider  acetaminophen (TYLENOL) 500 MG tablet Take 1,000 mg by mouth every 6 (six) hours as needed for fever (pain).    [provider]  atorvastatin (LIPITOR) 40 MG tablet Take 1 tablet (40 mg total) by mouth daily. 09/03/18   [name], MD  B Complex-Biotin-FA (MULTI-B COMPLEX) CAPS Take one a day ,B complex  multivitamin over the counter or use prenatal vitamin. 08/28/16   Dohmeier, Porfirio Mylar, MD  clopidogrel (PLAVIX) 75 MG tablet Take 1 tablet (75 mg total) by mouth daily. 11/11/19   Myles Lipps, MD  glucose blood (TRUE METRIX BLOOD GLUCOSE TEST) test strip 1 each by Other route 4 (four) times daily. And lancets 4/day 11/16/19   Romero Belling, MD  insulin aspart (NOVOLOG FLEXPEN) 100 UNIT/ML FlexPen 3 times a day (just before each meal), 04-18-10, and pen needles 4/day 11/16/19   Romero Belling, MD  Insulin Detemir (LEVEMIR FLEXTOUCH) 100 UNIT/ML Pen Inject 18 Units into the skin at bedtime. 09/15/19   Romero Belling, MD  Insulin Pen Needle (BD PEN NEEDLE NANO U/F) 32G X 4 MM MISC 1 each by Other route 4 (four) times daily. 11/16/19   Romero Belling, MD  irbesartan (AVAPRO) 150 MG tablet TAKE 1 TABLET(150 MG) BY MOUTH DAILY 11/11/19   Myles Lipps, MD  levETIRAcetam (KEPPRA) 500 MG tablet Take 1 tablet (500 mg  total) by mouth 2 (two) times daily. 02/09/19   Frann Rider, NP  LORazepam (ATIVAN) 0.5 MG tablet Take 0.5-1 tablets (0.25-0.5 mg total) by mouth 2 (two) times daily as needed for anxiety. 03/11/19   Frann Rider, NP  primidone (MYSOLINE) 250 MG tablet Take 1 tablet (250 mg total) by mouth 2 (two) times daily. Start 1 tablet at bedtime for 2 weeks and increase if tolerated to 1 tablet twice daily 05/19/19   Frann Rider, NP  sertraline (ZOLOFT) 100 MG tablet Take 2 tablets (200 mg total) by mouth at bedtime. 04/30/19   Rutherford Guys, MD    Past Medical History:  Diagnosis Date  . Chronic lower back pain   .  Depression   . Fibromyalgia   . High cholesterol   . Seizures ([place]) 11/2017   "day after she came home from hospital after having stroke" (01/01/2018)  . Stroke [facility]) 12/06/2017   "speech issues since" (01/01/2018)  . Type II diabetes mellitus ([place])     Past Surgical History:  Procedure Laterality Date  . BACK SURGERY    . CHOLECYSTECTOMY    . INSERT / REPLACE / REMOVE PACEMAKER  01/01/2018  . IR CT HEAD LTD  12/06/2017  . IR PERCUTANEOUS ART THROMBECTOMY/INFUSION INTRACRANIAL INC DIAG ANGIO  12/06/2017  . LOOP RECORDER INSERTION N/A 12/12/2017   Procedure: LOOP RECORDER INSERTION;  Surgeon: Constance Haw, MD;  Location: [place] [facility];  Service: Cardiovascular;  Laterality: N/A;  . LOOP RECORDER REMOVAL  01/01/2018  . LOOP RECORDER REMOVAL N/A 01/01/2018   Procedure: LOOP RECORDER REMOVAL;  Surgeon: Constance Haw, MD;  Location: [place] [facility];  Service: Cardiovascular;  Laterality: N/A;  . PACEMAKER IMPLANT N/A 01/01/2018   Procedure: PACEMAKER IMPLANT;  Surgeon: Constance Haw, MD;  Location: [place] [facility];  Service: Cardiovascular;  Laterality: N/A;  . RADIOLOGY WITH ANESTHESIA N/A 12/06/2017   Procedure: CODE STROKE;  Surgeon: Luanne Bras, MD;  Location: [place];  Service: Radiology;  Laterality: N/A;  . TEE WITHOUT CARDIOVERSION N/A 12/10/2017   Procedure: TRANSESOPHAGEAL ECHOCARDIOGRAM (TEE);  Surgeon: Jerline Pain, MD;  Location: [facility] ENDOSCOPY;  Service: Cardiovascular;  Laterality: N/A;  . THROMBECTOMY FEMORAL ARTERY Right 12/07/2017   Procedure: THROMBECTOMY RIGHT FEMORAL ARTERY;  Surgeon: Waynetta Sandy, MD;  Location: [place];  Service: Vascular;  Laterality: Right;  . TONSILLECTOMY      Social History   Tobacco Use  . Smoking status: Current Every Day Smoker    Packs/day: 1.00    Years: 38.00    Pack years: 38.00    Types: Cigarettes    Last attempt to quit: 12/06/2017    Years since quitting: 1.9  . Smokeless tobacco: Never  Used  Substance Use Topics  . Alcohol use: Yes    Comment: 01/01/2018 "nothing since stroke 11/2017"    Family History  Problem Relation Age of Onset  . Cancer Mother   . Diabetes Mother   . Heart disease Mother   . Parkinsonism Mother   . Diabetes Father   . Heart disease Father   . Dementia Father   . Cancer Maternal Grandmother 9       lung  . COPD Maternal Aunt     Review of Systems  Constitutional: Negative for chills and fever.  Respiratory: Negative for cough and shortness of breath.   Cardiovascular: Negative for chest pain, palpitations and leg swelling.  Gastrointestinal: Negative for abdominal pain, nausea and vomiting.  OBJECTIVE:  Today's Vitals   11/22/19 1053 11/22/19 1110  BP: (!) 147/71 126/70  Pulse: 84   Temp: 98.5 F (36.9 C)   TempSrc: Temporal   SpO2: 94%   Weight: 174 lb 12.8 oz (79.3 kg)   Height: 5\' 3"  (1.6 m)    Body mass index is 30.96 kg/m.  BP Readings from Last 3 Encounters:  11/22/19 126/70  11/16/19 140/70  09/27/19 109/67   Wt Readings from Last 3 Encounters:  11/22/19 174 lb 12.8 oz (79.3 kg)  11/16/19 173 lb 9.6 oz (78.7 kg)  09/27/19 172 lb (78 kg)    Physical Exam Vitals and nursing note reviewed.  Constitutional:      Appearance: She is well-developed.  HENT:     Head: Normocephalic and atraumatic.     Mouth/Throat:     Pharynx: No oropharyngeal exudate.  Eyes:     General: No scleral icterus.    Conjunctiva/sclera: Conjunctivae normal.     Pupils: Pupils are equal, round, and reactive to light.  Cardiovascular:     Rate and Rhythm: Normal rate and regular rhythm.     Heart sounds: Normal heart sounds. No murmur. No friction rub. No gallop.   Pulmonary:     Effort: Pulmonary effort is normal.     Breath sounds: Normal breath sounds. No wheezing or rales.  Musculoskeletal:     Cervical back: Neck supple.     Right lower leg: No edema.     Left lower leg: No edema.  Skin:    General: Skin is warm and  dry.  Neurological:     Mental Status: She is alert and oriented to person, place, and time. Mental status is at baseline.     Diabetic Foot Form - Detailed   Diabetic Foot Exam - detailed Diabetic Foot exam was performed with the following findings: Yes 11/22/2019 10:55 AM  Visual Foot Exam completed.: Yes  Pulse Foot Exam completed.: Yes  Sensory Foot Exam Completed.: Yes Semmes-Weinstein Monofilament Test       No results found for this or any previous visit (from the past 24 hour(s)).  No results found.   ASSESSMENT and PLAN  1. Moderate episode of recurrent major depressive disorder (HCC) Controlled. Continue current regime.   2. Type 2 diabetes mellitus with other neurologic complication, with long-term current use of insulin (HCC) Managed by endo - HM DIABETES FOOT EXAM - Microalbumin/Creatinine Ratio, Urine  3. Essential hypertension, benign Controlled. Continue current regime.   4. Hyperlipidemia LDL goal <70 Checking labs today, medications will be adjusted as needed.  - Comprehensive metabolic panel - Lipid panel  5. History of CVA with residual deficit Managed by neuro  Other orders - atorvastatin (LIPITOR) 40 MG tablet; Take 1 tablet (40 mg total) by mouth daily.  Return in about 8 months (around 07/24/2020) for CPE with pap.    07/26/2020, MD Primary Care at [facility] [address] [place], [place] Ph.  [phone] Fax [phone]

## 2019-11-22 NOTE — Patient Instructions (Signed)
° ° ° °  If you have lab work done today you will be contacted with your lab results within the next 2 weeks.  If you have not heard from us then please contact us. The fastest way to get your results is to register for My Chart. ° ° °IF you received an x-ray today, you will receive an invoice from [facility]. Please contact [facility] at [phone] with questions or concerns regarding your invoice.  ° °IF you received labwork today, you will receive an invoice from [facility]. Please contact [facility] at [phone] with questions or concerns regarding your invoice.  ° °Our billing staff will not be able to assist you with questions regarding bills from these companies. ° °You will be contacted with the lab results as soon as they are available. The fastest way to get your results is to activate your My Chart account. Instructions are located on the last page of this paperwork. If you have not heard from us regarding the results in 2 weeks, please contact this office. °  ° ° ° °

## 2019-12-09 ENCOUNTER — Other Ambulatory Visit: Payer: Self-pay | Admitting: Family Medicine

## 2019-12-09 MED ORDER — ROSUVASTATIN CALCIUM 20 MG PO TABS: 20.0000 mg | ORAL_TABLET | Freq: Every day | ORAL | 3 refills | Status: DC

## 2019-12-10 ENCOUNTER — Encounter (HOSPITAL_COMMUNITY): Payer: Self-pay | Admitting: Emergency Medicine

## 2019-12-10 ENCOUNTER — Other Ambulatory Visit: Payer: Self-pay

## 2019-12-10 ENCOUNTER — Emergency Department (HOSPITAL_COMMUNITY)
Admission: EM | Admit: 2019-12-10 | Discharge: 2019-12-11 | Disposition: A | Discharge status: Home / Self Care | Payer: BC Managed Care – PPO | Attending: Emergency Medicine | Admitting: Emergency Medicine

## 2019-12-10 DIAGNOSIS — I69328 Other speech and language deficits following cerebral infarction: Secondary | ICD-10-CM | POA: Diagnosis not present

## 2019-12-10 DIAGNOSIS — H53131 Sudden visual loss, right eye: Secondary | ICD-10-CM | POA: Diagnosis present

## 2019-12-10 DIAGNOSIS — Z79899 Other long term (current) drug therapy: Secondary | ICD-10-CM | POA: Diagnosis not present

## 2019-12-10 DIAGNOSIS — E119 Type 2 diabetes mellitus without complications: Secondary | ICD-10-CM | POA: Diagnosis not present

## 2019-12-10 DIAGNOSIS — Z794 Long term (current) use of insulin: Secondary | ICD-10-CM | POA: Insufficient documentation

## 2019-12-10 DIAGNOSIS — H5711 Ocular pain, right eye: Secondary | ICD-10-CM | POA: Insufficient documentation

## 2019-12-10 DIAGNOSIS — I10 Essential (primary) hypertension: Secondary | ICD-10-CM | POA: Diagnosis not present

## 2019-12-10 DIAGNOSIS — R519 Headache, unspecified: Secondary | ICD-10-CM | POA: Insufficient documentation

## 2019-12-10 DIAGNOSIS — F17210 Nicotine dependence, cigarettes, uncomplicated: Secondary | ICD-10-CM | POA: Diagnosis not present

## 2019-12-10 DIAGNOSIS — Z7902 Long term (current) use of antithrombotics/antiplatelets: Secondary | ICD-10-CM | POA: Insufficient documentation

## 2019-12-10 DIAGNOSIS — H5461 Unqualified visual loss, right eye, normal vision left eye: Secondary | ICD-10-CM

## 2019-12-10 LAB — I-STAT CHEM 8, ED
BUN: 18 mg/dL (ref 6–20)
Calcium, Ion: 1.26 mmol/L (ref 1.15–1.40)
Chloride: 104 mmol/L (ref 98–111)
Creatinine, Ser: 0.7 mg/dL (ref 0.44–1.00)
Glucose, Bld: 125 mg/dL — ABNORMAL HIGH (ref 70–99)
HCT: 40 % (ref 36.0–46.0)
Hemoglobin: 13.6 g/dL (ref 12.0–15.0)
Potassium: 4 mmol/L (ref 3.5–5.1)
Sodium: 139 mmol/L (ref 135–145)
TCO2: 25 mmol/L (ref 22–32)

## 2019-12-10 MED ORDER — TETRACAINE HCL 0.5 % OP SOLN
2.0000 [drp] | Freq: Once | OPHTHALMIC | Status: AC
  Administered 2019-12-11: 2 [drp] via OPHTHALMIC
  Filled 2019-12-10: qty 4

## 2019-12-10 NOTE — ED Provider Notes (Signed)
[facility] EMERGENCY DEPARTMENT Provider Note   CSN: [id number] Arrival date & time: 12/10/19  2311     History Chief Complaint  Patient presents with  . Blurred Vision  . Headache    Brittany [name] is a 59 y.o. female.  The history is provided by the patient.  Headache She has history of hypertension, diabetes, hyperlipidemia, stroke and comes in because of right eye pain and vision loss in the right eye.  She states that yesterday, she noted she was having floaters in her right eye and it has progressed to where she feels that she has lost the superior half of her visual field in the right eye.  If she closes her right eye, she has normal vision in the left.  If she closes her left eye, she can only see things that are in the inferior aspect of the visual field.  There is a very mild right-sided headache/eye pain which she rates at 1-2/10.  She denies any nausea or vomiting.  She denies any weakness or numbness or tingling.  She is on clopidogrel because of her history of an embolic stroke.  Past Medical History:  Diagnosis Date  . Chronic lower back pain   . Depression   . Fibromyalgia   . High cholesterol   . Seizures (HCC) 11/2017   "day after she came home from hospital after having stroke" (01/01/2018)  . Stroke [facility]) 12/06/2017   "speech issues since" (01/01/2018)  . Type II diabetes mellitus [facility])     Patient Active Problem List   Diagnosis Date Noted  . Non compliance with medical treatment 03/23/2018  . History of tobacco use disorder 01/01/2018  . Syncope and collapse 01/01/2018  . 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 Acute right hemiparesis (HCC) 12/12/2017  . History of completed stroke 12/12/2017  . Leukocytosis 12/12/2017  . Hypokalemia 12/12/2017  . Hypotension   . Middle cerebral artery  embolism, left 12/06/2017  . Poor compliance with CPAP treatment 03/04/2017  . OSA and COPD overlap syndrome (HCC) 03/04/2017  . Simple chronic bronchitis (HCC) 03/04/2017  . MCI (mild cognitive impairment) 11/27/2016  . Left sided lacunar infarction (HCC) 03/11/2014  . Cerebrovascular small vessel disease 03/11/2014  . Insomnia, idiopathic 12/20/2013  . Snoring 12/06/2013  . Essential hypertension, benign 10/26/2012  . Hyperlipidemia LDL goal <70 10/26/2012  . Polypharmacy 10/26/2012  . DM (diabetes mellitus) (HCC) 10/13/2011  . Tremor 10/13/2011  . GERD (gastroesophageal reflux disease) 10/13/2011  . Depression 10/13/2011    Past Surgical History:  Procedure Laterality Date  . BACK SURGERY    . CHOLECYSTECTOMY    . INSERT / REPLACE / REMOVE PACEMAKER  01/01/2018  . IR CT HEAD LTD  12/06/2017  . IR PERCUTANEOUS ART THROMBECTOMY/INFUSION INTRACRANIAL INC DIAG ANGIO  12/06/2017  . LOOP RECORDER INSERTION N/A 12/12/2017   Procedure: LOOP RECORDER INSERTION;  Surgeon: Regan Lemming, MD;  Location: [facility] INVASIVE CV LAB;  Service: Cardiovascular;  Laterality: N/A;  . LOOP RECORDER REMOVAL  01/01/2018  . LOOP RECORDER REMOVAL N/A 01/01/2018   Procedure: LOOP RECORDER REMOVAL;  Surgeon: Regan Lemming, MD;  Location: [facility] INVASIVE CV LAB;  Service: Cardiovascular;  Laterality: N/A;  . PACEMAKER IMPLANT N/A 01/01/2018   Procedure: PACEMAKER IMPLANT;  Surgeon: Regan Lemming, MD;  Location: [facility] INVASIVE CV LAB;  Service: Cardiovascular;  Laterality: N/A;  . RADIOLOGY WITH ANESTHESIA N/A 12/06/2017   Procedure: CODE STROKE;  Surgeon: Julieanne Cotton, MD;  Location: [facility] OR;  Service: Radiology;  Laterality: N/A;  . TEE WITHOUT CARDIOVERSION N/A 12/10/2017   Procedure: TRANSESOPHAGEAL ECHOCARDIOGRAM (TEE);  Surgeon: Jake Bathe, MD;  Location: [facility] ENDOSCOPY;  Service: Cardiovascular;  Laterality: N/A;  . THROMBECTOMY FEMORAL ARTERY Right 12/07/2017   Procedure: THROMBECTOMY RIGHT  FEMORAL ARTERY;  Surgeon: Maeola Harman, MD;  Location: [facility] OR;  Service: Vascular;  Laterality: Right;  . TONSILLECTOMY       OB History   No obstetric history on file.     Family History  Problem Relation Age of Onset  . Cancer Mother   . Diabetes Mother   . Heart disease Mother   . Parkinsonism Mother   . Diabetes Father   . Heart disease Father   . Dementia Father   . Cancer Maternal Grandmother 62       lung  . COPD Maternal Aunt     Social History   Tobacco Use  . Smoking status: Current Every Day Smoker    Packs/day: 1.00    Years: 38.00    Pack years: 38.00    Types: Cigarettes    Last attempt to quit: 12/06/2017    Years since quitting: 2.0  . Smokeless tobacco: Never Used  Substance Use Topics  . Alcohol use: Yes    Comment: 01/01/2018 "nothing since stroke 11/2017"  . Drug use: No    Home Medications Prior to Admission medications   Medication Sig Start Date End Date Taking? Authorizing Provider  acetaminophen (TYLENOL) 500 MG tablet Take 1,000 mg by mouth every 6 (six) hours as needed for fever (pain).    [provider]  B Complex-Biotin-FA (MULTI-B COMPLEX) CAPS Take one a day ,B complex  multivitamin over the counter or use prenatal vitamin. 08/28/16   Dohmeier, Porfirio Mylar, MD  clopidogrel (PLAVIX) 75 MG tablet Take 1 tablet (75 mg total) by mouth daily. 11/11/19   Myles Lipps, MD  glucose blood (TRUE METRIX BLOOD GLUCOSE TEST) test strip 1 each by Other route 4 (four) times daily. And lancets 4/day 11/16/19   Romero Belling, MD  insulin aspart (NOVOLOG FLEXPEN) 100 UNIT/ML FlexPen 3 times a day (just before each meal), 04-18-10, and pen needles 4/day 11/16/19   Romero Belling, MD  Insulin Detemir (LEVEMIR FLEXTOUCH) 100 UNIT/ML Pen Inject 18 Units into the skin at bedtime. 09/15/19   Romero Belling, MD  Insulin Pen Needle (BD PEN NEEDLE NANO U/F) 32G X 4 MM MISC 1 each by Other route 4 (four) times daily. 11/16/19   Romero Belling, MD  irbesartan  (AVAPRO) 150 MG tablet TAKE 1 TABLET(150 MG) BY MOUTH DAILY 11/11/19   Myles Lipps, MD  levETIRAcetam (KEPPRA) 500 MG tablet Take 1 tablet (500 mg total) by mouth 2 (two) times daily. 02/09/19   Ihor Austin, NP  LORazepam (ATIVAN) 0.5 MG tablet Take 0.5-1 tablets (0.25-0.5 mg total) by mouth 2 (two) times daily as needed for anxiety. 03/11/19   Ihor Austin, NP  primidone (MYSOLINE) 250 MG tablet Take 1 tablet (250 mg total) by mouth 2 (two) times daily. Start 1 tablet at bedtime for 2 weeks and increase if tolerated to 1 tablet twice daily 05/19/19   Ihor Austin, NP  rosuvastatin (CRESTOR) 20 MG tablet Take 1 tablet (20 mg total) by mouth daily. 12/09/19   Myles Lipps, MD  sertraline (ZOLOFT) 100 MG tablet  Take 2 tablets (200 mg total) by mouth at bedtime. 04/30/19   Rutherford Guys, MD    Allergies    Patient has no known allergies.  Review of Systems   Review of Systems  Neurological: Positive for headaches.  All other systems reviewed and are negative.   Physical Exam Updated Vital Signs BP (!) 142/68 (BP Location: Left Arm)   Pulse 75   Temp 98.1 F (36.7 C) (Oral)   Resp (!) 22   Ht 5\' 4"  (1.626 m)   Wt 77 kg   SpO2 99%   BMI 29.14 kg/m   Physical Exam Vitals and nursing note reviewed.   59 year old female, resting comfortably and in no acute distress. Vital signs are significant for mildly elevated blood pressure and mildly elevated respiratory rate. Oxygen saturation is 99%, which is normal. Head is normocephalic and atraumatic. PERRLA (pupils 3 mm), EOMI. Oropharynx is clear.  Funduscopic exam was limited, but no obvious hemorrhage, exudate, papilledema. Neck is nontender and supple without adenopathy or JVD. Back is nontender and there is no CVA tenderness. Lungs are clear without rales, wheezes, or rhonchi. Chest is nontender. Heart has regular rate and rhythm without murmur. Abdomen is soft, flat, nontender without masses or hepatosplenomegaly and  peristalsis is normoactive. Extremities have no cyanosis or edema, full range of motion is present. Skin is warm and dry without rash. Neurologic: Mental status is normal.visual fields are normal on the left, limited to central vision on the right.  Remainder of cranial nerves are intact, there are no motor or sensory deficits.  There is no facial droop.  There is no pronator drift.  There is no extinction on double simultaneous stimulation.  ED Results / Procedures / Treatments   Labs (all labs ordered are listed, but only abnormal results are displayed) Labs Reviewed  COMPREHENSIVE METABOLIC PANEL - Abnormal; Notable for the following components:      Result Value   Glucose, Bld 131 (*)    AST 14 (*)    All other components within normal limits  I-STAT CHEM 8, ED - Abnormal; Notable for the following components:   Glucose, Bld 125 (*)    All other components within normal limits  ETHANOL  PROTIME-INR  APTT  CBC  DIFFERENTIAL  RAPID URINE DRUG SCREEN, HOSP PERFORMED  URINALYSIS, ROUTINE W REFLEX MICROSCOPIC  SEDIMENTATION RATE    EKG EKG Interpretation  Date/Time:  Friday December 10 2019 23:33:56 EDT Ventricular Rate:  78 PR Interval:    QRS Duration: 81 QT Interval:  384 QTC Calculation: 438 R Axis:   65 Text Interpretation: Sinus rhythm Normal ECG When compared with ECG of 12/23/2017, No significant change was found Confirmed by Delora Fuel ([id number]) on 12/10/2019 11:46:38 PM   Radiology CT Head Wo Contrast  Result Date: 12/11/2019 CLINICAL DATA:  Headaches EXAM: CT HEAD WITHOUT CONTRAST TECHNIQUE: Contiguous axial images were obtained from the base of the skull through the vertex without intravenous contrast. COMPARISON:  12/13/2017 FINDINGS: Brain: Encephalomalacia changes are noted in the posterior parietal lobe on the left consistent with prior infarct. This has progressed in the interval from the prior exam. No findings to suggest acute hemorrhage, acute infarction or  space-occupying mass lesion are noted. Vascular: No hyperdense vessel or unexpected calcification. Skull: Normal. Negative for fracture or focal lesion. Sinuses/Orbits: No acute finding. Other: None. IMPRESSION: Chronic left posterior MCA infarct. No acute abnormality noted. Electronically Signed   By: Inez Catalina M.D.   On:  12/11/2019 00:11    Procedures Procedures  CRITICAL CARE Performed by: Dione Booze Total critical care time: 35 minutes Critical care time was exclusive of separately billable procedures and treating other patients. Critical care was necessary to treat or prevent imminent or life-threatening deterioration. Critical care was time spent personally by me on the following activities: development of treatment plan with patient and/or surrogate as well as nursing, discussions with consultants, evaluation of patient's response to treatment, examination of patient, obtaining history from patient or surrogate, ordering and performing treatments and interventions, ordering and review of laboratory studies, ordering and review of radiographic studies, pulse oximetry and re-evaluation of patient's condition.  Medications Ordered in ED Medications - No data to display  ED Course  I have reviewed the triage vital signs and the nursing notes.  Pertinent labs & imaging results that were available during my care of the patient were reviewed by me and considered in my medical decision making (see chart for details).  Monocular visual loss with retention of central vision.  No obvious vitreous hemorrhage noted on funduscopic exam.  Unable to do good evaluation for possible retinal detachment.  She will be sent for CT of head, will need ophthalmology consultation.  CT of head shows prior stroke in no acute process.  ECG is normal.  CBC and metabolic panel are unremarkable.  Visual acuity is 20/25 in the right eye, 20/8 in the left eye.  Ocular pressure was checked and is 12 in the right eye,  which is normal.  I have attempted to contact Dr. Gwen Pounds, on-call for ophthalmology, but have been unable to reach him and have left a message for him on his voicemail.  Sedimentation rate has come back normal.  Still unable to reach the on-call ophthalmologist.  She will be discharged home with instructions to follow-up with ophthalmology in the morning and, if unable to reach him, to return to the ED.  MDM Rules/Calculators/A&P   Final Clinical Impression(s) / ED Diagnoses Final diagnoses:  Vision loss, right eye    Rx / DC Orders ED Discharge Orders    None       Dione Booze, MD 12/11/19 [id number]

## 2019-12-10 NOTE — ED Triage Notes (Signed)
  Patient comes in with R eye blurriness that started last night.  Patient states it started as a floater and has gotten progressively worse and now can only see out of her lower right visual field.  Has mild headache at this time.  Hx of prior stroke with TPA and mechanical intervention in 03/20.  Patient A&O x4.

## 2019-12-11 ENCOUNTER — Other Ambulatory Visit: Payer: Self-pay

## 2019-12-11 ENCOUNTER — Ambulatory Visit: Payer: Self-pay | Admitting: Ophthalmology

## 2019-12-11 ENCOUNTER — Other Ambulatory Visit (HOSPITAL_COMMUNITY): Payer: BC Managed Care – PPO

## 2019-12-11 ENCOUNTER — Encounter (HOSPITAL_COMMUNITY): Payer: Self-pay | Admitting: Ophthalmology

## 2019-12-11 ENCOUNTER — Emergency Department (HOSPITAL_COMMUNITY): Payer: BC Managed Care – PPO

## 2019-12-11 LAB — COMPREHENSIVE METABOLIC PANEL
ALT: 13 U/L (ref 0–44)
AST: 14 U/L — ABNORMAL LOW (ref 15–41)
Albumin: 3.8 g/dL (ref 3.5–5.0)
Alkaline Phosphatase: 104 U/L (ref 38–126)
Anion gap: 12 (ref 5–15)
BUN: 15 mg/dL (ref 6–20)
CO2: 24 mmol/L (ref 22–32)
Calcium: 9.5 mg/dL (ref 8.9–10.3)
Chloride: 105 mmol/L (ref 98–111)
Creatinine, Ser: 0.79 mg/dL (ref 0.44–1.00)
GFR calc Af Amer: >60 mL/min (ref >60)
GFR calc non Af Amer: >60 mL/min (ref >60)
Glucose, Bld: 131 mg/dL — ABNORMAL HIGH (ref 70–99)
Potassium: 4.2 mmol/L (ref 3.5–5.1)
Sodium: 141 mmol/L (ref 135–145)
Total Bilirubin: 0.4 mg/dL (ref 0.3–1.2)
Total Protein: 7 g/dL (ref 6.5–8.1)

## 2019-12-11 LAB — CBC
HCT: 42.6 % (ref 36.0–46.0)
Hemoglobin: 14.2 g/dL (ref 12.0–15.0)
MCH: 30.5 pg (ref 26.0–34.0)
MCHC: 33.3 g/dL (ref 30.0–36.0)
MCV: 91.4 fL (ref 80.0–100.0)
Platelets: 278 10*3/uL (ref 150–400)
RBC: 4.66 MIL/uL (ref 3.87–5.11)
RDW: 11.9 % (ref 11.5–15.5)
WBC: 9.7 10*3/uL (ref 4.0–10.5)
nRBC: 0 % (ref 0.0–0.2)

## 2019-12-11 LAB — DIFFERENTIAL
Abs Immature Granulocytes: 0.02 10*3/uL (ref 0.00–0.07)
Basophils Absolute: 0 10*3/uL (ref 0.0–0.1)
Basophils Relative: 0 %
Eosinophils Absolute: 0.4 10*3/uL (ref 0.0–0.5)
Eosinophils Relative: 4 %
Immature Granulocytes: 0 %
Lymphocytes Relative: 30 %
Lymphs Abs: 2.9 10*3/uL (ref 0.7–4.0)
Monocytes Absolute: 0.6 10*3/uL (ref 0.1–1.0)
Monocytes Relative: 7 %
Neutro Abs: 5.8 10*3/uL (ref 1.7–7.7)
Neutrophils Relative %: 59 %

## 2019-12-11 LAB — RAPID URINE DRUG SCREEN, HOSP PERFORMED
Amphetamines: NOT DETECTED
Barbiturates: POSITIVE — AB
Benzodiazepines: NOT DETECTED
Cocaine: NOT DETECTED
Opiates: NOT DETECTED
Tetrahydrocannabinol: NOT DETECTED

## 2019-12-11 LAB — URINALYSIS, ROUTINE W REFLEX MICROSCOPIC
Bilirubin Urine: NEGATIVE
Glucose, UA: NEGATIVE mg/dL
Ketones, ur: NEGATIVE mg/dL
Nitrite: NEGATIVE
Protein, ur: NEGATIVE mg/dL
Specific Gravity, Urine: 1.023 (ref 1.005–1.030)
WBC, UA: >50 WBC/hpf — ABNORMAL HIGH (ref 0–5)
pH: 5 (ref 5.0–8.0)

## 2019-12-11 LAB — PROTIME-INR
INR: 0.9 (ref 0.8–1.2)
Prothrombin Time: 12 seconds (ref 11.4–15.2)

## 2019-12-11 LAB — SEDIMENTATION RATE: Sed Rate: 10 mm/hr (ref 0–22)

## 2019-12-11 LAB — ETHANOL: Alcohol, Ethyl (B): <10 mg/dL (ref <10)

## 2019-12-11 LAB — APTT: aPTT: 25 seconds (ref 24–36)

## 2019-12-11 NOTE — Discharge Instructions (Addendum)
Please call the ophthalmologist in the morning. If you have any difficulty reaching him, then return to the Emergency Department.

## 2019-12-11 NOTE — H&P (Deleted)
  The note originally documented on this encounter has been moved the the encounter in which it belongs.  

## 2019-12-11 NOTE — Progress Notes (Addendum)
Denies chest pain or shortness of breath. Provided below instructions regarding DM. Dr. Noreene Larsson notified of pacemaker.  . If your blood sugar is less than 70 mg/dL, you will need to treat for low blood sugar: o Do not take insulin. o Treat a low blood sugar (less than 70 mg/dL) with  cup of clear juice (cranberry or apple), 4 glucose tablets, OR glucose gel. Recheck blood sugar in 15 minutes after treatment (to make sure it is greater than 70 mg/dL). If your blood sugar is not greater than 70 mg/dL on recheck, call [phone] o  for further instructions. . Report your blood sugar to the short stay nurse when you get to Short Stay.  . If you are admitted to the hospital after surgery: o Your blood sugar will be checked by the staff and you will probably be given insulin after surgery (instead of oral diabetes medicines) to make sure you have good blood sugar levels. o The goal for blood sugar control after surgery is 80-180 mg/dL.   WHAT DO I DO ABOUT MY DIABETES MEDICATION?   Marland Kitchen Do not take oral diabetes medicines (pills) the morning of surgery.  . THE NIGHT BEFORE SURGERY, take _____9______ units of __Levemir_________insulin.       If your CBG is greater than 220 mg/dL, you may take  of your sliding scale (correction) dose of insulin.  Other Instructions:          Patient Signature:  Date:   Nurse Signature:  Date:   Reviewed and Endorsed by [facility] Patient Education Committee, August 2015

## 2019-12-11 NOTE — H&P (Signed)
Date of examination:  12/12/19   Indication for surgery: retinal detachment right eye  Pertinent past medical history:  Past Medical History:  Diagnosis Date  . Asthma   . Chronic lower back pain   . Depression   . Family history of adverse reaction to anesthesia    Mother has trouble coming out of anesthesia; patient reports she has no problems  . Fibromyalgia   . High cholesterol   . Presence of permanent cardiac pacemaker   . Seizures (HCC) 11/2017   "day after she came home from hospital after having stroke" (01/01/2018)  . Stroke [facility]) 12/06/2017   "speech issues since" (01/01/2018)  . Type II diabetes mellitus (HCC)     Pertinent ocular history: Superior visual field loss  Pertinent family history:  Family History  Problem Relation Age of Onset  . Cancer Mother   . Diabetes Mother   . Heart disease Mother   . Parkinsonism Mother   . Diabetes Father   . Heart disease Father   . Dementia Father   . Cancer Maternal Grandmother 62       lung  . COPD Maternal Aunt     General:  Healthy appearing patient in no distress.    Eyes:    Acuity OD 20/70  OS 20/25    External: Within normal limits     Anterior segment: Within normal limits      Fundus: inferior retinal detachment right eye    Impression:  Retinal detachment right eye  Plan: Retinal detachment repair right eye with vitrectomy and scleral buckle  Carmela Rima, MD

## 2019-12-11 NOTE — ED Notes (Signed)
Pt ambulated to bathroom, repeatedly stating that she felt "okay to walk," and denied weakness, faintness, and shortness of breath upon sitting up, standing, and beginning to ambulate. This NT mentioned the option of a bedside commode 3x, to which (each time) the pt insisted on ambulation to bathroom.

## 2019-12-12 ENCOUNTER — Ambulatory Visit (HOSPITAL_COMMUNITY): Payer: BC Managed Care – PPO | Admitting: Certified Registered Nurse Anesthetist

## 2019-12-12 ENCOUNTER — Encounter (HOSPITAL_COMMUNITY): Admission: RE | Disposition: A | Discharge status: Home / Self Care | Payer: Self-pay | Source: Ambulatory Visit

## 2019-12-12 ENCOUNTER — Ambulatory Visit (HOSPITAL_COMMUNITY)
Admission: RE | Admit: 2019-12-12 | Discharge: 2019-12-12 | Disposition: A | Discharge status: Home / Self Care | Payer: BC Managed Care – PPO | Source: Ambulatory Visit | Attending: Ophthalmology | Admitting: Ophthalmology

## 2019-12-12 ENCOUNTER — Encounter (HOSPITAL_COMMUNITY): Payer: Self-pay | Admitting: Ophthalmology

## 2019-12-12 DIAGNOSIS — Z8249 Family history of ischemic heart disease and other diseases of the circulatory system: Secondary | ICD-10-CM | POA: Insufficient documentation

## 2019-12-12 DIAGNOSIS — Z809 Family history of malignant neoplasm, unspecified: Secondary | ICD-10-CM | POA: Diagnosis not present

## 2019-12-12 DIAGNOSIS — F17200 Nicotine dependence, unspecified, uncomplicated: Secondary | ICD-10-CM | POA: Insufficient documentation

## 2019-12-12 DIAGNOSIS — K219 Gastro-esophageal reflux disease without esophagitis: Secondary | ICD-10-CM | POA: Insufficient documentation

## 2019-12-12 DIAGNOSIS — H33021 Retinal detachment with multiple breaks, right eye: Secondary | ICD-10-CM | POA: Diagnosis not present

## 2019-12-12 DIAGNOSIS — Z801 Family history of malignant neoplasm of trachea, bronchus and lung: Secondary | ICD-10-CM | POA: Diagnosis not present

## 2019-12-12 DIAGNOSIS — Z833 Family history of diabetes mellitus: Secondary | ICD-10-CM | POA: Insufficient documentation

## 2019-12-12 DIAGNOSIS — M797 Fibromyalgia: Secondary | ICD-10-CM | POA: Insufficient documentation

## 2019-12-12 DIAGNOSIS — G4730 Sleep apnea, unspecified: Secondary | ICD-10-CM | POA: Insufficient documentation

## 2019-12-12 DIAGNOSIS — E7800 Pure hypercholesterolemia, unspecified: Secondary | ICD-10-CM | POA: Insufficient documentation

## 2019-12-12 DIAGNOSIS — J449 Chronic obstructive pulmonary disease, unspecified: Secondary | ICD-10-CM | POA: Insufficient documentation

## 2019-12-12 DIAGNOSIS — I69328 Other speech and language deficits following cerebral infarction: Secondary | ICD-10-CM | POA: Diagnosis not present

## 2019-12-12 DIAGNOSIS — R569 Unspecified convulsions: Secondary | ICD-10-CM | POA: Diagnosis not present

## 2019-12-12 DIAGNOSIS — Z7902 Long term (current) use of antithrombotics/antiplatelets: Secondary | ICD-10-CM | POA: Diagnosis not present

## 2019-12-12 DIAGNOSIS — Z820 Family history of epilepsy and other diseases of the nervous system: Secondary | ICD-10-CM | POA: Diagnosis not present

## 2019-12-12 DIAGNOSIS — Z950 Presence of cardiac pacemaker: Secondary | ICD-10-CM | POA: Insufficient documentation

## 2019-12-12 DIAGNOSIS — F329 Major depressive disorder, single episode, unspecified: Secondary | ICD-10-CM | POA: Insufficient documentation

## 2019-12-12 DIAGNOSIS — Z20822 Contact with and (suspected) exposure to covid-19: Secondary | ICD-10-CM | POA: Diagnosis not present

## 2019-12-12 DIAGNOSIS — Z794 Long term (current) use of insulin: Secondary | ICD-10-CM | POA: Insufficient documentation

## 2019-12-12 DIAGNOSIS — E1140 Type 2 diabetes mellitus with diabetic neuropathy, unspecified: Secondary | ICD-10-CM | POA: Insufficient documentation

## 2019-12-12 DIAGNOSIS — H338 Other retinal detachments: Secondary | ICD-10-CM | POA: Diagnosis present

## 2019-12-12 DIAGNOSIS — Z825 Family history of asthma and other chronic lower respiratory diseases: Secondary | ICD-10-CM | POA: Insufficient documentation

## 2019-12-12 DIAGNOSIS — I10 Essential (primary) hypertension: Secondary | ICD-10-CM | POA: Diagnosis not present

## 2019-12-12 HISTORY — DX: Family history of other specified conditions: Z84.89

## 2019-12-12 HISTORY — DX: Unspecified asthma, uncomplicated: J45.909

## 2019-12-12 HISTORY — PX: SCLERAL BUCKLE WITH POSSIBLE 25 GAUGE PARS PLANA VITRECTOMY: SHX6206

## 2019-12-12 HISTORY — DX: Presence of cardiac pacemaker: Z95.0

## 2019-12-12 HISTORY — PX: GAS/FLUID EXCHANGE: SHX5334

## 2019-12-12 HISTORY — PX: PHOTOCOAGULATION: SHX5303

## 2019-12-12 LAB — GLUCOSE, CAPILLARY
Glucose-Capillary: 107 mg/dL — ABNORMAL HIGH (ref 70–99)
Glucose-Capillary: 120 mg/dL — ABNORMAL HIGH (ref 70–99)
Glucose-Capillary: 146 mg/dL — ABNORMAL HIGH (ref 70–99)

## 2019-12-12 LAB — RESPIRATORY PANEL BY RT PCR (FLU A&B, COVID)
Influenza A by PCR: NEGATIVE
Influenza B by PCR: NEGATIVE
SARS Coronavirus 2 by RT PCR: NEGATIVE

## 2019-12-12 SURGERY — SCLERAL BUCKLE WITH POSSIBLE 25 GAUGE PARS PLANA VITRECTOMY
Anesthesia: General | Site: Eye | Laterality: Right

## 2019-12-12 MED ORDER — HYPROMELLOSE (GONIOSCOPIC) 2.5 % OP SOLN
OPHTHALMIC | Status: AC
  Filled 2019-12-12: qty 15

## 2019-12-12 MED ORDER — PHENYLEPHRINE HCL 10 % OP SOLN
1.0000 [drp] | Freq: Once | OPHTHALMIC | Status: DC
  Filled 2019-12-12: qty 5

## 2019-12-12 MED ORDER — PROPOFOL 10 MG/ML IV BOLUS
INTRAVENOUS | Status: DC | PRN
  Administered 2019-12-12: 140 mg via INTRAVENOUS

## 2019-12-12 MED ORDER — ATROPINE SULFATE 1 % OP SOLN
OPHTHALMIC | Status: AC
  Filled 2019-12-12: qty 5

## 2019-12-12 MED ORDER — PHENYLEPHRINE HCL-NACL 10-0.9 MG/250ML-% IV SOLN
INTRAVENOUS | Status: DC | PRN
  Administered 2019-12-12: 40 ug/min via INTRAVENOUS

## 2019-12-12 MED ORDER — DEXAMETHASONE SODIUM PHOSPHATE 10 MG/ML IJ SOLN
INTRAMUSCULAR | Status: AC
  Filled 2019-12-12: qty 1

## 2019-12-12 MED ORDER — BUPIVACAINE HCL (PF) 0.75 % IJ SOLN
INTRAMUSCULAR | Status: AC
  Filled 2019-12-12: qty 30

## 2019-12-12 MED ORDER — ONDANSETRON HCL 4 MG/2ML IJ SOLN
INTRAMUSCULAR | Status: AC
  Filled 2019-12-12: qty 4

## 2019-12-12 MED ORDER — FENTANYL CITRATE (PF) 250 MCG/5ML IJ SOLN
INTRAMUSCULAR | Status: AC
  Filled 2019-12-12: qty 5

## 2019-12-12 MED ORDER — OFLOXACIN 0.3 % OP SOLN
1.0000 [drp] | OPHTHALMIC | Status: DC | PRN
  Administered 2019-12-12 (×2): 1 [drp] via OPHTHALMIC
  Filled 2019-12-12: qty 5

## 2019-12-12 MED ORDER — SUGAMMADEX SODIUM 200 MG/2ML IV SOLN
INTRAVENOUS | Status: DC | PRN
  Administered 2019-12-12: 200 mg via INTRAVENOUS

## 2019-12-12 MED ORDER — ONDANSETRON HCL 4 MG/2ML IJ SOLN
INTRAMUSCULAR | Status: DC | PRN
  Administered 2019-12-12: 4 mg via INTRAVENOUS

## 2019-12-12 MED ORDER — BSS PLUS IO SOLN
INTRAOCULAR | Status: AC
  Filled 2019-12-12: qty 500

## 2019-12-12 MED ORDER — TETRACAINE HCL 0.5 % OP SOLN
OPHTHALMIC | Status: AC
  Filled 2019-12-12: qty 4

## 2019-12-12 MED ORDER — CEFAZOLIN SUBCONJUNCTIVAL INJECTION 100 MG/0.5 ML
100.0000 mg | INJECTION | SUBCONJUNCTIVAL | Status: DC
  Filled 2019-12-12: qty 5

## 2019-12-12 MED ORDER — CYCLOPENTOLATE HCL 1 % OP SOLN
1.0000 [drp] | OPHTHALMIC | Status: DC | PRN
  Administered 2019-12-12 (×2): 1 [drp] via OPHTHALMIC
  Filled 2019-12-12: qty 2

## 2019-12-12 MED ORDER — PHENYLEPHRINE HCL 2.5 % OP SOLN
1.0000 [drp] | OPHTHALMIC | Status: DC | PRN
  Administered 2019-12-12 (×2): 1 [drp] via OPHTHALMIC
  Filled 2019-12-12: qty 2

## 2019-12-12 MED ORDER — SODIUM HYALURONATE 10 MG/ML IO SOLN
INTRAOCULAR | Status: AC
  Filled 2019-12-12: qty 0.85

## 2019-12-12 MED ORDER — DEXAMETHASONE SODIUM PHOSPHATE 10 MG/ML IJ SOLN
INTRAMUSCULAR | Status: DC | PRN
  Administered 2019-12-12: 10 mg

## 2019-12-12 MED ORDER — HYPROMELLOSE (GONIOSCOPIC) 2.5 % OP SOLN
OPHTHALMIC | Status: DC | PRN
  Administered 2019-12-12: 2 [drp] via OPHTHALMIC

## 2019-12-12 MED ORDER — LIDOCAINE HCL 2 % IJ SOLN
INTRAMUSCULAR | Status: DC | PRN
  Administered 2019-12-12: 2 mL via RETROBULBAR

## 2019-12-12 MED ORDER — HYALURONIDASE HUMAN 150 UNIT/ML IJ SOLN
INTRAMUSCULAR | Status: AC
  Filled 2019-12-12: qty 1

## 2019-12-12 MED ORDER — 0.9 % SODIUM CHLORIDE (POUR BTL) OPTIME
TOPICAL | Status: DC | PRN
  Administered 2019-12-12: 08:00:00 1000 mL

## 2019-12-12 MED ORDER — BSS IO SOLN
INTRAOCULAR | Status: AC
  Filled 2019-12-12: qty 15

## 2019-12-12 MED ORDER — PHENYLEPHRINE 40 MCG/ML (10ML) SYRINGE FOR IV PUSH (FOR BLOOD PRESSURE SUPPORT)
PREFILLED_SYRINGE | INTRAVENOUS | Status: DC | PRN
  Administered 2019-12-12: 40 ug via INTRAVENOUS
  Administered 2019-12-12: 80 ug via INTRAVENOUS

## 2019-12-12 MED ORDER — CEFTAZIDIME 1 G IJ SOLR
INTRAMUSCULAR | Status: AC
  Filled 2019-12-12: qty 1

## 2019-12-12 MED ORDER — PROPOFOL 10 MG/ML IV BOLUS
INTRAVENOUS | Status: AC
  Filled 2019-12-12: qty 20

## 2019-12-12 MED ORDER — LIDOCAINE HCL 2 % IJ SOLN
INTRAMUSCULAR | Status: AC
  Filled 2019-12-12: qty 20

## 2019-12-12 MED ORDER — ROCURONIUM BROMIDE 100 MG/10ML IV SOLN
INTRAVENOUS | Status: DC | PRN
  Administered 2019-12-12: 50 mg via INTRAVENOUS
  Administered 2019-12-12: 20 mg via INTRAVENOUS
  Administered 2019-12-12: 10 mg via INTRAVENOUS

## 2019-12-12 MED ORDER — TOBRAMYCIN 0.3 % OP OINT
TOPICAL_OINTMENT | OPHTHALMIC | Status: DC | PRN
  Administered 2019-12-12: 1 via OPHTHALMIC

## 2019-12-12 MED ORDER — TOBRAMYCIN-DEXAMETHASONE 0.3-0.1 % OP OINT
TOPICAL_OINTMENT | OPHTHALMIC | Status: AC
  Filled 2019-12-12: qty 3.5

## 2019-12-12 MED ORDER — CEFAZOLIN SUBCONJUNCTIVAL INJECTION 100 MG/0.5 ML
INJECTION | SUBCONJUNCTIVAL | Status: DC | PRN
  Administered 2019-12-12: 100 mg via SUBCONJUNCTIVAL

## 2019-12-12 MED ORDER — BSS IO SOLN
INTRAOCULAR | Status: DC | PRN
  Administered 2019-12-12: 15 mL via INTRAOCULAR

## 2019-12-12 MED ORDER — DEXAMETHASONE SODIUM PHOSPHATE 10 MG/ML IJ SOLN
INTRAMUSCULAR | Status: AC
  Filled 2019-12-12: qty 2

## 2019-12-12 MED ORDER — STERILE WATER FOR INJECTION IJ SOLN
INTRAMUSCULAR | Status: AC
  Filled 2019-12-12: qty 10

## 2019-12-12 MED ORDER — ROCURONIUM BROMIDE 10 MG/ML (PF) SYRINGE
PREFILLED_SYRINGE | INTRAVENOUS | Status: AC
  Filled 2019-12-12: qty 20

## 2019-12-12 MED ORDER — SODIUM CHLORIDE 0.9 % IV SOLN: INTRAVENOUS | Status: DC

## 2019-12-12 MED ORDER — POLYMYXIN B SULFATE 500000 UNITS IJ SOLR
INTRAMUSCULAR | Status: AC
  Filled 2019-12-12: qty 500000

## 2019-12-12 MED ORDER — MIDAZOLAM HCL 2 MG/2ML IJ SOLN
INTRAMUSCULAR | Status: AC
  Filled 2019-12-12: qty 2

## 2019-12-12 MED ORDER — EPINEPHRINE PF 1 MG/ML IJ SOLN
INTRAMUSCULAR | Status: AC
  Filled 2019-12-12: qty 1

## 2019-12-12 MED ORDER — EPINEPHRINE PF 1 MG/ML IJ SOLN
INTRAOCULAR | Status: DC | PRN
  Administered 2019-12-12: 500 mL

## 2019-12-12 MED ORDER — FENTANYL CITRATE (PF) 250 MCG/5ML IJ SOLN
INTRAMUSCULAR | Status: DC | PRN
  Administered 2019-12-12: 50 ug via INTRAVENOUS

## 2019-12-12 MED ORDER — ACETAZOLAMIDE SODIUM 500 MG IJ SOLR
INTRAMUSCULAR | Status: AC
  Filled 2019-12-12: qty 500

## 2019-12-12 MED ORDER — TRIAMCINOLONE ACETONIDE 40 MG/ML IJ SUSP
INTRAMUSCULAR | Status: AC
  Filled 2019-12-12: qty 5

## 2019-12-12 MED ORDER — DEXAMETHASONE SODIUM PHOSPHATE 10 MG/ML IJ SOLN
INTRAMUSCULAR | Status: DC | PRN
  Administered 2019-12-12: 4 mg via INTRAVENOUS

## 2019-12-12 MED ORDER — PHENYLEPHRINE 40 MCG/ML (10ML) SYRINGE FOR IV PUSH (FOR BLOOD PRESSURE SUPPORT)
PREFILLED_SYRINGE | INTRAVENOUS | Status: AC
  Filled 2019-12-12: qty 10

## 2019-12-12 MED ORDER — TROPICAMIDE 1 % OP SOLN
1.0000 [drp] | OPHTHALMIC | Status: DC | PRN
  Administered 2019-12-12 (×2): 1 [drp] via OPHTHALMIC
  Filled 2019-12-12: qty 15

## 2019-12-12 MED ORDER — LIDOCAINE 2% (20 MG/ML) 5 ML SYRINGE
INTRAMUSCULAR | Status: AC
  Filled 2019-12-12: qty 10

## 2019-12-12 MED ORDER — STERILE WATER FOR INJECTION IJ SOLN
INTRAMUSCULAR | Status: DC | PRN
  Administered 2019-12-12: 20 mL

## 2019-12-12 MED ORDER — LIDOCAINE 2% (20 MG/ML) 5 ML SYRINGE
INTRAMUSCULAR | Status: DC | PRN
  Administered 2019-12-12: 80 mg via INTRAVENOUS

## 2019-12-12 MED ORDER — SODIUM CHLORIDE (PF) 0.9 % IJ SOLN
INTRAMUSCULAR | Status: AC
  Filled 2019-12-12: qty 10

## 2019-12-12 SURGICAL SUPPLY — 68 items
APL SRG 3 HI ABS STRL LF PLS (MISCELLANEOUS) ×4
APL SWBSTK 6 STRL LF DISP (MISCELLANEOUS) ×3
APPLICATOR COTTON TIP 6 STRL (MISCELLANEOUS) ×1 IMPLANT
APPLICATOR COTTON TIP 6IN STRL (MISCELLANEOUS) ×9
APPLICATOR DR MATTHEWS STRL (MISCELLANEOUS) ×12 IMPLANT
BAND SCLERAL BUCKLING TYPE 240 (Ophthalmic Related) ×2 IMPLANT
BAND SCLR 100X2.5X.6 TY 240 (Ophthalmic Related) ×1 IMPLANT
BAND WRIST GAS GREEN (MISCELLANEOUS) IMPLANT
BNDG EYE OVAL (GAUZE/BANDAGES/DRESSINGS) ×8 IMPLANT
CANNULA ANT CHAM MAIN (OPHTHALMIC RELATED) IMPLANT
CANNULA DUAL BORE 23G (CANNULA) IMPLANT
CANNULA VLV SOFT TIP 25G (OPHTHALMIC) ×1 IMPLANT
CANNULA VLV SOFT TIP 25GA (OPHTHALMIC) ×3 IMPLANT
CLOSURE STERI-STRIP 1/2X4 (GAUZE/BANDAGES/DRESSINGS) ×1
CLSR STERI-STRIP ANTIMIC 1/2X4 (GAUZE/BANDAGES/DRESSINGS) ×1 IMPLANT
COVER SURGICAL LIGHT HANDLE (MISCELLANEOUS) ×3 IMPLANT
DRAPE INCISE 51X51 W/FILM STRL (DRAPES) ×3 IMPLANT
DRAPE RETRACTOR (MISCELLANEOUS) ×3 IMPLANT
ERASER HMR WETFIELD 23G BP (MISCELLANEOUS) IMPLANT
FILTER BLUE MILLIPORE (MISCELLANEOUS) IMPLANT
FILTER STRAW FLUID ASPIR (MISCELLANEOUS) ×2 IMPLANT
FORCEPS GRIESHABER ILM 25G A (INSTRUMENTS) IMPLANT
GAS AUTO FILL CONSTEL (OPHTHALMIC) ×3
GAS AUTO FILL CONSTELLATION (OPHTHALMIC) IMPLANT
GAS WRIST BAND GREEN (MISCELLANEOUS)
GLOVE SURG SYN 7.5  E (GLOVE) ×6
GLOVE SURG SYN 7.5 E (GLOVE) ×2 IMPLANT
GLOVE SURG SYN 7.5 PF PI (GLOVE) ×1 IMPLANT
GOWN STRL REUS W/ TWL LRG LVL3 (GOWN DISPOSABLE) ×2 IMPLANT
GOWN STRL REUS W/TWL LRG LVL3 (GOWN DISPOSABLE) ×6
HANDLE PNEUMATIC FOR CONSTEL (OPHTHALMIC) IMPLANT
KIT BASIN OR (CUSTOM PROCEDURE TRAY) ×3 IMPLANT
KIT PERFLUORON PROCEDURE 5ML (MISCELLANEOUS) IMPLANT
KIT TURNOVER KIT B (KITS) ×3 IMPLANT
KNIFE GRIESHABER SHARP 2.5MM (MISCELLANEOUS) ×3 IMPLANT
LENS BIOM SUPER VIEW SET DISP (MISCELLANEOUS) ×3 IMPLANT
NDL 18GX1X1/2 (RX/OR ONLY) (NEEDLE) ×1 IMPLANT
NDL 25GX 5/8IN NON SAFETY (NEEDLE) ×1 IMPLANT
NDL FILTER BLUNT 18X1 1/2 (NEEDLE) ×1 IMPLANT
NDL HYPO 25GX1X1/2 BEV (NEEDLE) ×1 IMPLANT
NDL RETROBULBAR 25GX1.5 (NEEDLE) ×1 IMPLANT
NEEDLE 18GX1X1/2 (RX/OR ONLY) (NEEDLE) ×12 IMPLANT
NEEDLE 25GX 5/8IN NON SAFETY (NEEDLE) ×9 IMPLANT
NEEDLE FILTER BLUNT 18X 1/2SAF (NEEDLE) ×8
NEEDLE FILTER BLUNT 18X1 1/2 (NEEDLE) ×4 IMPLANT
NEEDLE HYPO 25GX1X1/2 BEV (NEEDLE) ×3 IMPLANT
NEEDLE RETROBULBAR 25GX1.5 (NEEDLE) ×3 IMPLANT
NS IRRIG 1000ML POUR BTL (IV SOLUTION) ×3 IMPLANT
PACK VITRECTOMY CUSTOM (CUSTOM PROCEDURE TRAY) ×3 IMPLANT
PAD ARMBOARD 7.5X6 YLW CONV (MISCELLANEOUS) ×6 IMPLANT
PAK PIK VITRECTOMY CVS 25GA (OPHTHALMIC) ×3 IMPLANT
PROBE LASER ILLUM FLEX CVD 25G (OPHTHALMIC) ×2 IMPLANT
SCRAPER DIAMOND 25GA (OPHTHALMIC RELATED) ×2 IMPLANT
SHEET MEDIUM DRAPE 40X70 STRL (DRAPES) ×3 IMPLANT
SHIELD EYE LENSE ONLY DISP (GAUZE/BANDAGES/DRESSINGS) ×2 IMPLANT
SOL ANTI FOG 6CC (MISCELLANEOUS) ×1 IMPLANT
SOLUTION ANTI FOG 6CC (MISCELLANEOUS) ×2
STOPCOCK 4 WAY LG BORE MALE ST (IV SETS) ×2 IMPLANT
SUT MERSILENE 5 0 GS 24 (SUTURE) ×4 IMPLANT
SUT SILK 2 0 (SUTURE) ×3
SUT SILK 2-0 18XBRD TIE 12 (SUTURE) IMPLANT
SUT VICRYL 8 0 TG140 8 (SUTURE) ×2 IMPLANT
SYR 10ML LL (SYRINGE) ×9 IMPLANT
SYR 5ML LL (SYRINGE) ×5 IMPLANT
SYR BULB 3OZ (MISCELLANEOUS) ×3 IMPLANT
SYR TB 1ML LUER SLIP (SYRINGE) ×10 IMPLANT
WATER STERILE IRR 1000ML POUR (IV SOLUTION) ×3 IMPLANT
WIPE INSTRUMENT VISIWIPE 73X73 (MISCELLANEOUS) IMPLANT

## 2019-12-12 NOTE — Brief Op Note (Signed)
12/12/2019  10:26 AM  PATIENT:  Brittany [name]  59 y.o. female  PRE-OPERATIVE DIAGNOSIS:  retinal detachment of right eye  POST-OPERATIVE DIAGNOSIS:  retinal detachment of right eye  PROCEDURE:  Procedure(s): SCLERAL BUCKLE WITH  PARS PLANA VITRECTOMY RETINAL DETACHMENT REPAIR (Right) Gas/Fluid Exchange (Right) Photocoagulation (Right)  SURGEON:  Surgeon(s) and Role:    * Carmela Rima, MD - Primary  PHYSICIAN ASSISTANT:   ASSISTANTS: none   ANESTHESIA:   local and general  EBL:  minimal   BLOOD ADMINISTERED:none  DRAINS: none   LOCAL MEDICATIONS USED:  MARCAINE    and LIDOCAINE   SPECIMEN:  No Specimen  DISPOSITION OF SPECIMEN:  N/A  COUNTS:  YES  TOURNIQUET:  * No tourniquets in log *  DICTATION: .Note written in EPIC  PLAN OF CARE: Discharge to home after PACU  PATIENT DISPOSITION:  PACU - hemodynamically stable.   Delay start of Pharmacological VTE agent (>24hrs) due to surgical blood loss or risk of bleeding: not applicable

## 2019-12-12 NOTE — Anesthesia Preprocedure Evaluation (Addendum)
Anesthesia Evaluation  Patient identified by MRN, date of birth, ID band Patient awake    Reviewed: Allergy & Precautions, NPO status , Patient's Chart, lab work & pertinent test results  Airway Mallampati: III  TM Distance: >3 FB Neck ROM: Full    Dental  (+) Dental Advisory Given, Edentulous Upper, Missing   Pulmonary asthma , sleep apnea , COPD, Current Smoker and Patient abstained from smoking.,    Pulmonary exam normal breath sounds clear to auscultation       Cardiovascular hypertension, Pt. on medications Normal cardiovascular exam+ pacemaker  Rhythm:Regular Rate:Normal     Neuro/Psych Seizures -,  PSYCHIATRIC DISORDERS Depression Retinal detachment right eye  CVA, Residual Symptoms    GI/Hepatic Neg liver ROS, GERD  ,  Endo/Other  diabetes, Insulin Dependent  Renal/GU negative Renal ROS     Musculoskeletal  (+) Fibromyalgia -  Abdominal   Peds  Hematology  (+) Blood dyscrasia (Plavix), ,   Anesthesia Other Findings Day of surgery medications reviewed with the patient.  Reproductive/Obstetrics                            Anesthesia Physical Anesthesia Plan  ASA: III  Anesthesia Plan: General   Post-op Pain Management:    Induction: Intravenous  PONV Risk Score and Plan: 2 and Dexamethasone, Ondansetron and Midazolam  Airway Management Planned: Oral ETT  Additional Equipment:   Intra-op Plan:   Post-operative Plan: Extubation in OR  Informed Consent: I have reviewed the patients History and Physical, chart, labs and discussed the procedure including the risks, benefits and alternatives for the proposed anesthesia with the patient or authorized representative who has indicated his/her understanding and acceptance.     Dental advisory given  Plan Discussed with: CRNA  Anesthesia Plan Comments:         Anesthesia Quick Evaluation

## 2019-12-12 NOTE — Transfer of Care (Signed)
Immediate Anesthesia Transfer of Care Note  Patient: Brittany [name]  Procedure(s) Performed: SCLERAL BUCKLE WITH  PARS PLANA VITRECTOMY RETINAL DETACHMENT REPAIR (Right Eye) Gas/Fluid Exchange (Right Eye) Photocoagulation (Right Eye)  Patient Location: PACU  Anesthesia Type:General  Level of Consciousness: drowsy  Airway & Oxygen Therapy: Patient Spontanous Breathing and Patient connected to face mask oxygen  Post-op Assessment: Report given to RN and Post -op Vital signs reviewed and stable  Post vital signs: Reviewed  Last Vitals:  Vitals Value Taken Time  BP 110/51 12/12/19 1040  Temp    Pulse 79 12/12/19 1043  Resp 19 12/12/19 1043  SpO2 98 % 12/12/19 1043  Vitals shown include unvalidated device data.  Last Pain: There were no vitals filed for this visit.       Complications: No apparent anesthesia complications

## 2019-12-12 NOTE — Anesthesia Postprocedure Evaluation (Signed)
Anesthesia Post Note  Patient: Brittany [name]  Procedure(s) Performed: SCLERAL BUCKLE WITH  PARS PLANA VITRECTOMY RETINAL DETACHMENT REPAIR (Right Eye) Gas/Fluid Exchange (Right Eye) Photocoagulation (Right Eye)     Patient location during evaluation: PACU Anesthesia Type: General Level of consciousness: awake and alert Pain management: pain level controlled Vital Signs Assessment: post-procedure vital signs reviewed and stable Respiratory status: spontaneous breathing, nonlabored ventilation and respiratory function stable Cardiovascular status: blood pressure returned to baseline and stable Postop Assessment: no apparent nausea or vomiting Anesthetic complications: no    Last Vitals:  Vitals:   12/12/19 1056 12/12/19 1110  BP: (!) 103/58 (!) 105/58  Pulse: 80 77  Resp: 14 19  Temp:  36.8 C  SpO2: 99% 99%    Last Pain:  Vitals:   12/12/19 1110  PainSc: 0-No pain                 Cecile Hearing

## 2019-12-12 NOTE — Discharge Instructions (Addendum)
DO NOT SLEEP ON BACK, THE EYE PRESSURE CAN GO UP AND CAUSE VISION LOSS   SLEEP ON SIDE WITH NOSE TO PILLOW  DURING DAY KEEP FACE DOWN.  15 MINUTES EVERY 2 HOURS IT IS OK TO LOOK STRAIGHT AHEAD (USE BATHROOM, EAT, WALK, ETC.)  

## 2019-12-12 NOTE — Anesthesia Procedure Notes (Signed)
Procedure Name: Intubation Date/Time: 12/12/2019 8:06 AM Performed by: Janene Harvey, CRNA Pre-anesthesia Checklist: Patient identified, Emergency Drugs available, Suction available and Patient being monitored Patient Re-evaluated:Patient Re-evaluated prior to induction Oxygen Delivery Method: Circle system utilized Preoxygenation: Pre-oxygenation with 100% oxygen Induction Type: IV induction Ventilation: Mask ventilation without difficulty Laryngoscope Size: Mac and 4 Grade View: Grade II Tube type: Oral Number of attempts: 1 Airway Equipment and Method: Stylet and Oral airway Placement Confirmation: ETT inserted through vocal cords under direct vision,  positive ETCO2 and breath sounds checked- equal and bilateral Secured at: 23 cm Tube secured with: Tape Dental Injury: Teeth and Oropharynx as per pre-operative assessment

## 2019-12-13 ENCOUNTER — Encounter: Payer: Self-pay | Admitting: *Deleted

## 2020-01-04 ENCOUNTER — Ambulatory Visit (INDEPENDENT_AMBULATORY_CARE_PROVIDER_SITE_OTHER): Payer: BC Managed Care – PPO | Admitting: *Deleted

## 2020-01-04 DIAGNOSIS — R55 Syncope and collapse: Secondary | ICD-10-CM

## 2020-01-04 LAB — CUP PACEART REMOTE DEVICE CHECK
Battery Remaining Longevity: 160 mo
Battery Voltage: 3.03 V
Brady Statistic AP VP Percent: 7.86 %
Brady Statistic AP VS Percent: 1.75 %
Brady Statistic AS VP Percent: 0.65 %
Brady Statistic AS VS Percent: 89.74 %
Brady Statistic RA Percent Paced: 9.73 %
Brady Statistic RV Percent Paced: 8.51 %
Date Time Interrogation Session: 20210427004029
Implantable Lead Implant Date: 20190425
Implantable Lead Implant Date: 20190425
Implantable Lead Location: 753859
Implantable Lead Location: 753860
Implantable Lead Model: 5076
Implantable Lead Model: 5076
Implantable Pulse Generator Implant Date: 20190425
Lead Channel Impedance Value: 304 Ohm
Lead Channel Impedance Value: 361 Ohm
Lead Channel Impedance Value: 437 Ohm
Lead Channel Impedance Value: 456 Ohm
Lead Channel Pacing Threshold Amplitude: 0.625 V
Lead Channel Pacing Threshold Amplitude: 0.75 V
Lead Channel Pacing Threshold Pulse Width: 0.4 ms
Lead Channel Pacing Threshold Pulse Width: 0.4 ms
Lead Channel Sensing Intrinsic Amplitude: 0.875 mV
Lead Channel Sensing Intrinsic Amplitude: 0.875 mV
Lead Channel Sensing Intrinsic Amplitude: 16.375 mV
Lead Channel Sensing Intrinsic Amplitude: 16.375 mV
Lead Channel Setting Pacing Amplitude: 1.5 V
Lead Channel Setting Pacing Amplitude: 2.5 V
Lead Channel Setting Pacing Pulse Width: 0.4 ms
Lead Channel Setting Sensing Sensitivity: 2 mV

## 2020-01-05 NOTE — Progress Notes (Signed)
PPM Remote  

## 2020-01-09 NOTE — Op Note (Signed)
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  A lid speculum was placed.    360 degree peritomy was made with initial radial incision in the inferotemporal quadrant.  Attention was directed towards placement of the scleral buckle.  Tenon's was disected back with stevens scissors and each rectus muscle was looped with 3-0 silk.  Tenons was dissected free from the muscle body.  Mattress sutures were placed 2.5 mm posterior to the muscle insertion and a 41 scleral buckle element was placed under the preplaced mattress sutures and secured with a Watzke sleeve in the inferotemporal quadrant.  Three of the mattress sutures were tied in place while the remaining mattress suture in the inferotemporal quadrant was secured with a slip knot to later adjust after the vitrectomy portion of the procedure.  The buckle element was tightened to attain adequate imbrication to support the retina.  Attention was then directed to the vitrectomy portion of the procedure.    Infusion line and trocar was placed at the 8 o'clock position approximately 3.5 mm from the surgical limbus.   The infusion line was allowed to run and then clamped when placed at the cannula opening. The line was inserted and secured to the drape with an adhesive strip.   Active trocars/cannula were placed at the 10 and 2 o'clock positions approximately 3.5 mm from the surgical limbus. The cannula was visualized in the vitreous cavity.  The light pipe and vitreous cutter were inserted into the vitreous cavity and a core vitrectomy was performed.  Care taken to  remove the vitreous up to the vitreous base for 360 degrees. With the aid of scleral depression the vitreous was dissected free from the peripheral retina ensuring careful shaving of the base in the area of detachment as well as lattice degeneration. Perflouron was used the aid in flattening the macula.  A posterior retinotomy was made with endocuatery just adjacent to the optic disc and subretinal fluid was drained as a complete air/fluid exchange was performed.     3 rows of endolaser were applied 360 degrees to the periphery surrounding the breaks as well as the area of detachment. .  14% C3F8 gas was placed in the eye.    The superior cannulas were sequentially removed with concommitant tamponade using a cotton tipped applicator and noted to be air tight.  The infusion line and trocar were removed and the sclerotomy was noted to be air tight with normal intraocular pressure by digital palpapation.  Subconjunctival injections of  Dexamethasone 4mg /1ml was placed in the infero-medial quadrant.   The speculum and drapes were removed and the eye was patched with Polymixin/Bacitracin ophthalmic ointment. An eye shield was placed and the patient was transferred alert and conversant with stable vital signs to the post operative recovery area.  The patient tolerated the procedure well and no complications were noted.  0m MD

## 2020-02-05 ENCOUNTER — Other Ambulatory Visit: Payer: Self-pay | Admitting: Family Medicine

## 2020-02-05 ENCOUNTER — Other Ambulatory Visit: Payer: Self-pay | Admitting: Adult Health

## 2020-02-05 NOTE — Telephone Encounter (Signed)
Requested Prescriptions  Pending Prescriptions Disp Refills  . sertraline (ZOLOFT) 100 MG tablet [Pharmacy Med Name: SERTRALINE 100MG  TABLETS] 180 tablet 1    Sig: TAKE 2 TABLETS(200 MG) BY MOUTH AT BEDTIME     Psychiatry:  Antidepressants - SSRI Passed - 02/05/2020  6:22 AM      Passed - Completed PHQ-2 or PHQ-9 in the last 360 days.      Passed - Valid encounter within last 6 months    Recent Outpatient Visits          2 months ago Moderate episode of recurrent major depressive disorder [facility])   Primary Care at [facility], Oneita Jolly, MD   9 months ago Moderate episode of recurrent major depressive disorder [facility])   Primary Care at [facility], Oneita Jolly, MD   1 year ago Urine WBC increased   Primary Care at [facility], Etta Grandchild, MD   1 year ago Type 2 diabetes mellitus with other neurologic complication, with long-term current use of insulin [facility])   Primary Care at [facility], Etta Grandchild, MD   1 year ago Type 2 diabetes mellitus with other neurologic complication, with long-term current use of insulin [facility])   Primary Care at [facility], Etta Grandchild, MD      Future Appointments            In 5 months Levell July, MD Primary Care at [place], [facility]

## 2020-02-22 ENCOUNTER — Telehealth: Payer: Self-pay

## 2020-02-22 NOTE — Telephone Encounter (Signed)
Called and made an appt 8:00 AM Glory Buff, Roselyn Bering, NP

## 2020-02-22 NOTE — Telephone Encounter (Signed)
error 

## 2020-02-22 NOTE — Telephone Encounter (Signed)
Primary Cardiologist:Will Jorja Loa, MD  Chart reviewed as part of pre-operative protocol coverage. Because of Brittany [name]'s past medical history and time since last visit, he/she will require a follow-up visit in order to better assess preoperative cardiovascular risk.  Pre-op covering staff: - Please schedule appointment and call patient to inform them. - Please contact requesting surgeon's office via preferred method (i.e, phone, fax) to inform them of need for appointment prior to surgery.  If applicable, this message will also be routed to pharmacy pool and/or primary cardiologist for input on holding anticoagulant/antiplatelet agent as requested below so that this information is available at time of patient's appointment.   Ronney Asters, NP  02/22/2020, 3:18 PM

## 2020-02-22 NOTE — Telephone Encounter (Signed)
   [facility] Pre-operative Risk Assessment    HEARTCARE STAFF: - Please ensure there is not already an duplicate clearance open for this procedure. - Under Visit Info/Reason for Call, type in Other and utilize the format Clearance MM/DD/YY or Clearance TBD. Do not use dashes or single digits. - If request is for dental extraction, please clarify the # of teeth to be extracted.  Request for surgical clearance:  1. What type of surgery is being performed? Right cataract surgery   2. When is this surgery scheduled? 02/25/20   3. What type of clearance is required (medical clearance vs. Pharmacy clearance to hold med vs. Both)? Medical (want to make sure this procedure does not interfere with pacemaker/defibrillator  4. Are there any medications that need to be held prior to surgery and how long? None   5. Practice name and name of physician performing surgery? [facility]   6. What is the office phone number? [place]   7.   What is the office fax number? [phone]  8.   Anesthesia type (None, local, MAC, general) ? None listed   Mady Haagensen 02/22/2020, 2:57 PM  _________________________________________________________________   (provider comments below)

## 2020-02-24 ENCOUNTER — Encounter: Payer: Self-pay | Admitting: Nurse Practitioner

## 2020-02-24 ENCOUNTER — Ambulatory Visit (INDEPENDENT_AMBULATORY_CARE_PROVIDER_SITE_OTHER): Payer: BC Managed Care – PPO | Admitting: Nurse Practitioner

## 2020-02-24 ENCOUNTER — Telehealth: Payer: Self-pay | Admitting: *Deleted

## 2020-02-24 ENCOUNTER — Other Ambulatory Visit: Payer: Self-pay

## 2020-02-24 VITALS — BP 110/70 | HR 81 | Ht 63.5 in | Wt 181.0 lb

## 2020-02-24 DIAGNOSIS — R001 Bradycardia, unspecified: Secondary | ICD-10-CM

## 2020-02-24 DIAGNOSIS — I639 Cerebral infarction, unspecified: Secondary | ICD-10-CM

## 2020-02-24 DIAGNOSIS — Z01818 Encounter for other preprocedural examination: Secondary | ICD-10-CM

## 2020-02-24 LAB — CUP PACEART INCLINIC DEVICE CHECK
Battery Remaining Longevity: 157 mo
Battery Voltage: 3.04 V
Brady Statistic AP VP Percent: 2.93 %
Brady Statistic AP VS Percent: 0.79 %
Brady Statistic AS VP Percent: 1.18 %
Brady Statistic AS VS Percent: 95.1 %
Brady Statistic RA Percent Paced: 3.88 %
Brady Statistic RV Percent Paced: 4.11 %
Date Time Interrogation Session: 20210617081940
Implantable Lead Implant Date: 20190425
Implantable Lead Implant Date: 20190425
Implantable Lead Location: 753859
Implantable Lead Location: 753860
Implantable Lead Model: 5076
Implantable Lead Model: 5076
Implantable Pulse Generator Implant Date: 20190425
Lead Channel Impedance Value: 361 Ohm
Lead Channel Impedance Value: 380 Ohm
Lead Channel Impedance Value: 437 Ohm
Lead Channel Impedance Value: 494 Ohm
Lead Channel Pacing Threshold Amplitude: 0.625 V
Lead Channel Pacing Threshold Amplitude: 0.875 V
Lead Channel Pacing Threshold Pulse Width: 0.4 ms
Lead Channel Pacing Threshold Pulse Width: 0.4 ms
Lead Channel Sensing Intrinsic Amplitude: 0.75 mV
Lead Channel Sensing Intrinsic Amplitude: 16.375 mV
Lead Channel Sensing Intrinsic Amplitude: 18 mV
Lead Channel Sensing Intrinsic Amplitude: 3.5 mV
Lead Channel Setting Pacing Amplitude: 1.75 V
Lead Channel Setting Pacing Amplitude: 2.5 V
Lead Channel Setting Pacing Pulse Width: 0.4 ms
Lead Channel Setting Sensing Sensitivity: 2 mV

## 2020-02-24 NOTE — Telephone Encounter (Signed)
Patient seen in clinic this AM by AS, NP.  Form has been completed and faxed back.

## 2020-02-24 NOTE — Telephone Encounter (Signed)
Our office received a clearance form for pt's device; Request states it is  STAT as the procedure is scheduled for tomorrow 02/25/20. I will bring the clearance the form the our device clinic as well as I will send a message to the device pool.

## 2020-02-24 NOTE — Patient Instructions (Signed)
Medication Instructions:  none *If you need a refill on your cardiac medications before your next appointment, please call your pharmacy*   Lab Work: none If you have labs (blood work) drawn today and your tests are completely normal, you will receive your results only by: Marland Kitchen MyChart Message (if you have MyChart) OR . A paper copy in the mail If you have any lab test that is abnormal or we need to change your treatment, we will call you to review the results.   Testing/Procedures: none   Follow-Up: At [facility], you and your health needs are our priority.  As part of our continuing mission to provide you with exceptional heart care, we have created designated Provider Care Teams.  These Care Teams include your primary Cardiologist (physician) and Advanced Practice Providers (APPs -  Physician Assistants and Nurse Practitioners) who all work together to provide you with the care you need, when you need it.   Your next appointment:   1 year(s)  The format for your next appointment:   Either In Person or Virtual  Provider:   Dr Elberta Fortis   Other Instructions Remote monitoring is used to monitor your Pacemaker from home. This monitoring reduces the number of office visits required to check your device to one time per year. It allows [place] to keep an eye on the functioning of your device to ensure it is working properly. You are scheduled for a device check from home on 04/04/20. You may send your transmission at any time that day. If you have a wireless device, the transmission will be sent automatically. After your physician reviews your transmission, you will receive a postcard with your next transmission date.

## 2020-02-24 NOTE — Progress Notes (Signed)
Electrophysiology Office Note Date: 02/24/2020  ID:  Brittany, [name] [birth year], MRN [id number]  PCP: Myles Lipps, MD Electrophysiologist: Elberta Fortis  CC: Pacemaker follow-up  Brittany [name] is a 58 y.o. female seen today for Dr Elberta Fortis.  She presents today for routine electrophysiology followup.  Since last being seen in our clinic, the patient reports doing very well.  Her three grandchildren live with her (4,4,3).  She denies chest pain, palpitations, dyspnea, PND, orthopnea, nausea, vomiting, dizziness, syncope, edema, weight gain, or early satiety.  Device History: MDT dual chamber PPM implanted 2019 for SSS   Past Medical History:  Diagnosis Date  . Asthma   . Chronic lower back pain   . Depression   . Family history of adverse reaction to anesthesia    Mother has trouble coming out of anesthesia; patient reports she has no problems  . Fibromyalgia   . High cholesterol   . Presence of permanent cardiac pacemaker   . Seizures (HCC) 11/2017   "day after she came home from hospital after having stroke" (01/01/2018)  . Stroke [facility]) 12/06/2017   "speech issues since" (01/01/2018)  . Type II diabetes mellitus (HCC)    Past Surgical History:  Procedure Laterality Date  . CHOLECYSTECTOMY    . GAS/FLUID EXCHANGE Right 12/12/2019   Procedure: Gas/Fluid Exchange;  Surgeon: Carmela Rima, MD;  Location: [facility] OR;  Service: Ophthalmology;  Laterality: Right;  . INSERT / REPLACE / REMOVE PACEMAKER  01/01/2018  . IR CT HEAD LTD  12/06/2017  . IR PERCUTANEOUS ART THROMBECTOMY/INFUSION INTRACRANIAL INC DIAG ANGIO  12/06/2017  . LOOP RECORDER INSERTION N/A 12/12/2017   Procedure: LOOP RECORDER INSERTION;  Surgeon: Regan Lemming, MD;  Location: [facility] INVASIVE CV LAB;  Service: Cardiovascular;  Laterality: N/A;  . LOOP RECORDER REMOVAL  01/01/2018  . LOOP RECORDER REMOVAL N/A 01/01/2018   Procedure: LOOP RECORDER REMOVAL;  Surgeon: Regan Lemming, MD;  Location: [facility] INVASIVE  CV LAB;  Service: Cardiovascular;  Laterality: N/A;  . NECK SURGERY    . PACEMAKER IMPLANT N/A 01/01/2018   Procedure: PACEMAKER IMPLANT;  Surgeon: Regan Lemming, MD;  Location: [facility] INVASIVE CV LAB;  Service: Cardiovascular;  Laterality: N/A;  . PHOTOCOAGULATION Right 12/12/2019   Procedure: Photocoagulation;  Surgeon: Carmela Rima, MD;  Location: [facility] OR;  Service: Ophthalmology;  Laterality: Right;  . RADIOLOGY WITH ANESTHESIA N/A 12/06/2017   Procedure: CODE STROKE;  Surgeon: Julieanne Cotton, MD;  Location: [facility] OR;  Service: Radiology;  Laterality: N/A;  . SCLERAL BUCKLE WITH POSSIBLE 25 GAUGE PARS PLANA VITRECTOMY Right 12/12/2019   Procedure: SCLERAL BUCKLE WITH  PARS PLANA VITRECTOMY RETINAL DETACHMENT REPAIR;  Surgeon: Carmela Rima, MD;  Location: [facility] OR;  Service: Ophthalmology;  Laterality: Right;  . TEE WITHOUT CARDIOVERSION N/A 12/10/2017   Procedure: TRANSESOPHAGEAL ECHOCARDIOGRAM (TEE);  Surgeon: Jake Bathe, MD;  Location: [facility] ENDOSCOPY;  Service: Cardiovascular;  Laterality: N/A;  . THROMBECTOMY FEMORAL ARTERY Right 12/07/2017   Procedure: THROMBECTOMY RIGHT FEMORAL ARTERY;  Surgeon: Maeola Harman, MD;  Location: [facility] OR;  Service: Vascular;  Laterality: Right;  . TONSILLECTOMY      Current Outpatient Medications  Medication Sig Dispense Refill  . acetaminophen (TYLENOL) 500 MG tablet Take 1,000 mg by mouth every 6 (six) hours as needed for fever (pain).    . B Complex-Biotin-FA (MULTI-B COMPLEX) CAPS Take one a day ,B complex  multivitamin over the counter or use prenatal vitamin. 90 capsule 0  . clopidogrel (PLAVIX) 75  MG tablet Take 1 tablet (75 mg total) by mouth daily. 90 tablet 3  . glucose blood (TRUE METRIX BLOOD GLUCOSE TEST) test strip 1 each by Other route 4 (four) times daily. And lancets 4/day 360 each 3  . insulin aspart (NOVOLOG FLEXPEN) 100 UNIT/ML FlexPen 3 times a day (just before each meal), 04-18-10, and pen needles 4/day 15 mL 11  . Insulin  Detemir (LEVEMIR FLEXTOUCH) 100 UNIT/ML Pen Inject 18 Units into the skin at bedtime. 45 mL 1  . Insulin Pen Needle (BD PEN NEEDLE NANO U/F) 32G X 4 MM MISC 1 each by Other route 4 (four) times daily. 360 each 2  . irbesartan (AVAPRO) 150 MG tablet TAKE 1 TABLET(150 MG) BY MOUTH DAILY 90 tablet 3  . levETIRAcetam (KEPPRA) 500 MG tablet TAKE 1 TABLET(500 MG) BY MOUTH TWICE DAILY 180 tablet 4  . LORazepam (ATIVAN) 0.5 MG tablet Take 0.5-1 tablets (0.25-0.5 mg total) by mouth 2 (two) times daily as needed for anxiety. 45 tablet 0  . primidone (MYSOLINE) 250 MG tablet Take 250 mg by mouth daily.    . rosuvastatin (CRESTOR) 20 MG tablet Take 1 tablet (20 mg total) by mouth daily. 90 tablet 3  . sertraline (ZOLOFT) 100 MG tablet TAKE 2 TABLETS(200 MG) BY MOUTH AT BEDTIME 180 tablet 1   No current facility-administered medications for this visit.    Allergies:   Patient has no known allergies.   Social History: Social History   Socioeconomic History  . Marital status: Married    Spouse name: Jonny [name]  . Number of children: 2  . Years of education: 64  . Highest education level: Not on file  Occupational History  . Not on file  Tobacco Use  . Smoking status: Current Every Day Smoker    Packs/day: 1.00    Years: 38.00    Pack years: 38.00    Types: Cigarettes  . Smokeless tobacco: Never Used  Vaping Use  . Vaping Use: Never used  Substance and Sexual Activity  . Alcohol use: Yes    Comment: 01/01/2018 "nothing since stroke 11/2017"  . Drug use: No  . Sexual activity: Not Currently  Other Topics Concern  . Not on file  Social History Narrative   Patient is married (Brittany) and lives at home with her family   Patient has two children.   Patient works at [employer].   Patient has a college education.   Patient is right-handed.   Patient drinks 3 cups of coffee M-F.      Social Determinants of Health   Financial Resource Strain:   . Difficulty of Paying Living Expenses:     Food Insecurity:   . Worried About Programme researcher, broadcasting/film/video in the Last Year:   . Barista in the Last Year:   Transportation Needs:   . Freight forwarder (Medical):   Marland Kitchen Lack of Transportation (Non-Medical):   Physical Activity:   . Days of Exercise per Week:   . Minutes of Exercise per Session:   Stress:   . Feeling of Stress :   Social Connections:   . Frequency of Communication with Friends and Family:   . Frequency of Social Gatherings with Friends and Family:   . Attends Religious Services:   . Active Member of Clubs or Organizations:   . Attends Banker Meetings:   Marland Kitchen Marital Status:   Intimate Partner Violence:   . Fear of Current or Ex-Partner:   .  Emotionally Abused:   Marland Kitchen Physically Abused:   . Sexually Abused:     Family History: Family History  Problem Relation Age of Onset  . Cancer Mother   . Diabetes Mother   . Heart disease Mother   . Parkinsonism Mother   . Diabetes Father   . Heart disease Father   . Dementia Father   . Cancer Maternal Grandmother 82       lung  . COPD Maternal Aunt      Review of Systems: All other systems reviewed and are otherwise negative except as noted above.   Physical Exam: VS:  BP 110/70   Pulse 81   Ht 5' 3.5" (1.613 m)   Wt 181 lb (82.1 kg)   SpO2 98%   BMI 31.56 kg/m  , BMI Body mass index is 31.56 kg/m.  GEN- The patient is well appearing, alert and oriented x 3 today.   HEENT: normocephalic, atraumatic; sclera clear, conjunctiva pink; hearing intact; oropharynx clear; neck supple  Lungs- Clear to ausculation bilaterally, normal work of breathing.  No wheezes, rales, rhonchi Heart- Regular rate and rhythm  GI- soft, non-tender, non-distended, bowel sounds present  Extremities- no clubbing, cyanosis, or edema  MS- no significant deformity or atrophy Skin- warm and dry, no rash or lesion; PPM pocket well healed Psych- euthymic mood, full affect Neuro- strength and sensation are  intact  PPM Interrogation- reviewed in detail today,  See PACEART report  EKG:  EKG is not ordered today.  Recent Labs: 12/10/2019: ALT 13; BUN 18; Creatinine, Ser 0.70; Hemoglobin 13.6; Platelets 278; Potassium 4.0; Sodium 139   Wt Readings from Last 3 Encounters:  02/24/20 181 lb (82.1 kg)  12/12/19 176 lb (79.8 kg)  12/10/19 169 lb 12.1 oz (77 kg)     Assessment and Plan:  1.  Symptomatic bradycardia Normal PPM function See Pace Art report Her bipolar atrial sensing is 69mV, unipolar is 3.55mV. She has had undersensing noted on remotes. Lead trends stable. Atrial sensing reprogrammed to unipolar today.    2.  Cryptogenic stroke No AF to date  3.  Surgical clearance She is pending cataract surgery  She is at low risk for this low risk procedure. No further cardiovascular testing is required prior to proceeding.    Current medicines are reviewed at length with the patient today.   The patient does not have concerns regarding her medicines.  The following changes were made today:  none  Labs/ tests ordered today include: none No orders of the defined types were placed in this encounter.    Disposition:   Follow up with [facility], Dr Curt Bears 1 year    Signed, Chanetta Marshall, NP 02/24/2020 8:16 AM  [facility] [address] [place] [address] [phone] (office) [phone] (fax)

## 2020-02-28 ENCOUNTER — Ambulatory Visit (INDEPENDENT_AMBULATORY_CARE_PROVIDER_SITE_OTHER): Payer: BC Managed Care – PPO | Admitting: Endocrinology

## 2020-02-28 ENCOUNTER — Encounter: Payer: Self-pay | Admitting: Endocrinology

## 2020-02-28 ENCOUNTER — Other Ambulatory Visit: Payer: Self-pay

## 2020-02-28 VITALS — BP 128/70 | HR 90 | Ht 63.5 in | Wt 180.2 lb

## 2020-02-28 DIAGNOSIS — Z794 Long term (current) use of insulin: Secondary | ICD-10-CM | POA: Diagnosis not present

## 2020-02-28 DIAGNOSIS — E1149 Type 2 diabetes mellitus with other diabetic neurological complication: Secondary | ICD-10-CM | POA: Diagnosis not present

## 2020-02-28 LAB — POCT GLYCOSYLATED HEMOGLOBIN (HGB A1C): Hemoglobin A1C: 7.3 % — AB (ref 4.0–5.6)

## 2020-02-28 MED ORDER — NOVOLOG FLEXPEN 100 UNIT/ML ~~LOC~~ SOPN: PEN_INJECTOR | SUBCUTANEOUS | 11 refills | Status: DC

## 2020-02-28 MED ORDER — LEVEMIR FLEXTOUCH 100 UNIT/ML ~~LOC~~ SOPN: 18.0000 [IU] | PEN_INJECTOR | Freq: Every day | SUBCUTANEOUS | 1 refills | Status: DC

## 2020-02-28 NOTE — Progress Notes (Signed)
Subjective:    Patient ID: Brittany [name], female    DOB: 1961/03/30, 59 y.o.   MRN: [id number]  HPI Pt returns for f/u of diabetes mellitus: DM type: Insulin-requiring type 2 Dx'ed: 1999 Complications: CVA Therapy: insulin since early 2019 GDM: 1987 DKA: never Severe hypoglycemia: never Pancreatitis: never Pancreatic imaging: normal on 2019 CT.   SHOH: husband gives pt her insulin (due to CVA--he says he knows how to use syringe and vial).   Other: she gets multiple daily injections.   Interval history: she brings her meter with her cbg's which I have reviewed today.  cbg varies from 87-297.  it is highest in the afternoon, and lowest at HS.  She never misses the insulin.  She seldom has hypoglycemia, and these episodes are mild.  Past Medical History:  Diagnosis Date  . Asthma   . Chronic lower back pain   . Depression   . Family history of adverse reaction to anesthesia    Mother has trouble coming out of anesthesia; patient reports she has no problems  . Fibromyalgia   . High cholesterol   . Presence of permanent cardiac pacemaker   . Seizures (HCC) 11/2017   "day after she came home from hospital after having stroke" (01/01/2018)  . Stroke [facility]) 12/06/2017   "speech issues since" (01/01/2018)  . Type II diabetes mellitus (HCC)     Past Surgical History:  Procedure Laterality Date  . CHOLECYSTECTOMY    . GAS/FLUID EXCHANGE Right 12/12/2019   Procedure: Gas/Fluid Exchange;  Surgeon: Brittany Rima, MD;  Location: [facility] OR;  Service: Ophthalmology;  Laterality: Right;  . INSERT / REPLACE / REMOVE PACEMAKER  01/01/2018  . IR CT HEAD LTD  12/06/2017  . IR PERCUTANEOUS ART THROMBECTOMY/INFUSION INTRACRANIAL INC DIAG ANGIO  12/06/2017  . LOOP RECORDER INSERTION N/A 12/12/2017   Procedure: LOOP RECORDER INSERTION;  Surgeon: Brittany Lemming, MD;  Location: [facility] INVASIVE CV LAB;  Service: Cardiovascular;  Laterality: N/A;  . LOOP RECORDER REMOVAL  01/01/2018  . LOOP RECORDER  REMOVAL N/A 01/01/2018   Procedure: LOOP RECORDER REMOVAL;  Surgeon: Brittany Lemming, MD;  Location: [facility] INVASIVE CV LAB;  Service: Cardiovascular;  Laterality: N/A;  . NECK SURGERY    . PACEMAKER IMPLANT N/A 01/01/2018   Procedure: PACEMAKER IMPLANT;  Surgeon: Brittany Lemming, MD;  Location: [facility] INVASIVE CV LAB;  Service: Cardiovascular;  Laterality: N/A;  . PHOTOCOAGULATION Right 12/12/2019   Procedure: Photocoagulation;  Surgeon: Brittany Rima, MD;  Location: [facility] OR;  Service: Ophthalmology;  Laterality: Right;  . RADIOLOGY WITH ANESTHESIA N/A 12/06/2017   Procedure: CODE STROKE;  Surgeon: Brittany Cotton, MD;  Location: [facility] OR;  Service: Radiology;  Laterality: N/A;  . SCLERAL BUCKLE WITH POSSIBLE 25 GAUGE PARS PLANA VITRECTOMY Right 12/12/2019   Procedure: SCLERAL BUCKLE WITH  PARS PLANA VITRECTOMY RETINAL DETACHMENT REPAIR;  Surgeon: Brittany Rima, MD;  Location: [facility] OR;  Service: Ophthalmology;  Laterality: Right;  . TEE WITHOUT CARDIOVERSION N/A 12/10/2017   Procedure: TRANSESOPHAGEAL ECHOCARDIOGRAM (TEE);  Surgeon: Brittany Bathe, MD;  Location: [facility] ENDOSCOPY;  Service: Cardiovascular;  Laterality: N/A;  . THROMBECTOMY FEMORAL ARTERY Right 12/07/2017   Procedure: THROMBECTOMY RIGHT FEMORAL ARTERY;  Surgeon: Brittany Harman, MD;  Location: [facility] OR;  Service: Vascular;  Laterality: Right;  . TONSILLECTOMY      Social History   Socioeconomic History  . Marital status: Married    Spouse name: Brittany [name]  . Number of children: 2  . Years of education:  14  . Highest education level: Not on file  Occupational History  . Not on file  Tobacco Use  . Smoking status: Current Every Day Smoker    Packs/day: 1.00    Years: 38.00    Pack years: 38.00    Types: Cigarettes  . Smokeless tobacco: Never Used  Vaping Use  . Vaping Use: Never used  Substance and Sexual Activity  . Alcohol use: Yes    Comment: 01/01/2018 "nothing since stroke 11/2017"  . Drug use: No  . Sexual activity:  Not Currently  Other Topics Concern  . Not on file  Social History Narrative   Patient is married (Brittany [name]) and lives at home with her family   Patient has two children.   Patient works at [employer].   Patient has a college education.   Patient is right-handed.   Patient drinks 3 cups of coffee M-F.      Social Determinants of Health   Financial Resource Strain:   . Difficulty of Paying Living Expenses:   Food Insecurity:   . Worried About Brittany [name] in the Last Year:   . Barista in the Last Year:   Transportation Needs:   . Freight forwarder (Medical):   Marland Kitchen Lack of Transportation (Non-Medical):   Physical Activity:   . Days of Exercise per Week:   . Minutes of Exercise per Session:   Stress:   . Feeling of Stress :   Social Connections:   . Frequency of Communication with Friends and Family:   . Frequency of Social Gatherings with Friends and Family:   . Attends Religious Services:   . Active Member of Clubs or Organizations:   . Attends Banker Meetings:   Marland Kitchen Marital Status:   Intimate Partner Violence:   . Fear of Current or Ex-Partner:   . Emotionally Abused:   Marland Kitchen Physically Abused:   . Sexually Abused:     Current Outpatient Medications on File Prior to Visit  Medication Sig Dispense Refill  . acetaminophen (TYLENOL) 500 MG tablet Take 1,000 mg by mouth every 6 (six) hours as needed for fever (pain).    . B Complex-Biotin-FA (MULTI-B COMPLEX) CAPS Take one a day ,B complex  multivitamin over the counter or use prenatal vitamin. 90 capsule 0  . clopidogrel (PLAVIX) 75 MG tablet Take 1 tablet (75 mg total) by mouth daily. 90 tablet 3  . glucose blood (TRUE METRIX BLOOD GLUCOSE TEST) test strip 1 each by Other route 4 (four) times daily. And lancets 4/day 360 each 3  . Insulin Pen Needle (BD PEN NEEDLE NANO U/F) 32G X 4 MM MISC 1 each by Other route 4 (four) times daily. 360 each 2  . irbesartan (AVAPRO) 150 MG tablet TAKE 1  TABLET(150 MG) BY MOUTH DAILY 90 tablet 3  . levETIRAcetam (KEPPRA) 500 MG tablet TAKE 1 TABLET(500 MG) BY MOUTH TWICE DAILY 180 tablet 4  . LORazepam (ATIVAN) 0.5 MG tablet Take 0.5-1 tablets (0.25-0.5 mg total) by mouth 2 (two) times daily as needed for anxiety. 45 tablet 0  . primidone (MYSOLINE) 250 MG tablet Take 250 mg by mouth daily.    . rosuvastatin (CRESTOR) 20 MG tablet Take 1 tablet (20 mg total) by mouth daily. 90 tablet 3  . sertraline (ZOLOFT) 100 MG tablet TAKE 2 TABLETS(200 MG) BY MOUTH AT BEDTIME 180 tablet 1   No current facility-administered medications on file prior to visit.  No Known Allergies  Family History  Problem Relation Age of Onset  . Cancer Mother   . Diabetes Mother   . Heart disease Mother   . Parkinsonism Mother   . Diabetes Father   . Heart disease Father   . Dementia Father   . Cancer Maternal Grandmother 60       lung  . COPD Maternal Aunt     BP 128/70   Pulse 90   Ht 5' 3.5" (1.613 m)   Wt 180 lb 3.2 oz (81.7 kg)   SpO2 97%   BMI 31.42 kg/m    Review of Systems She denies LOC    Objective:   Physical Exam VITAL SIGNS:  See vs page GENERAL: no distress Pulses: dorsalis pedis intact bilat.   MSK: no deformity of the feet CV: no leg edema Skin:  no ulcer on the feet.  normal color and temp on the feet. Neuro: sensation is intact to touch on the feet   Lab Results  Component Value Date   HGBA1C 7.3 (A) 02/28/2020       Assessment & Plan:  Insulin-requiring type 2 DM, with CVA. Hypoglycemia, due to insulin.  Based on the pattern of her cbg's, she needs some adjustment in her therapy  Patient Instructions  check your blood sugar 4 times a day: before the 3 meals, and at bedtime.  also check if you have symptoms of your blood sugar being too high or too low.  please keep a record of the readings and bring it to your next appointment here (or you can bring the meter itself).  You can write it on any piece of paper.  please  call us sooner if your blood sugar goes below 70, or if you have a lot of readings over 200.   Please change the Novolog to 3 times a day (just before each meal), 04-19-09 units, and: Please continue the same Levemir.   Please come back for a follow-up appointment in 3 months.

## 2020-02-28 NOTE — Patient Instructions (Addendum)
check your blood sugar 4 times a day: before the 3 meals, and at bedtime.  also check if you have symptoms of your blood sugar being too high or too low.  please keep a record of the readings and bring it to your next appointment here (or you can bring the meter itself).  You can write it on any piece of paper.  please call us sooner if your blood sugar goes below 70, or if you have a lot of readings over 200.   Please change the Novolog to 3 times a day (just before each meal), 04-19-09 units, and: Please continue the same Levemir.   Please come back for a follow-up appointment in 3 months.

## 2020-03-27 ENCOUNTER — Other Ambulatory Visit: Payer: Self-pay | Admitting: Adult Health

## 2020-03-27 ENCOUNTER — Encounter: Payer: Self-pay | Admitting: Adult Health

## 2020-03-27 ENCOUNTER — Ambulatory Visit: Payer: BC Managed Care – PPO | Admitting: Adult Health

## 2020-03-27 VITALS — BP 134/79 | HR 103 | Ht 63.0 in | Wt 181.0 lb

## 2020-03-27 DIAGNOSIS — I6930 Unspecified sequelae of cerebral infarction: Secondary | ICD-10-CM

## 2020-03-27 DIAGNOSIS — I10 Essential (primary) hypertension: Secondary | ICD-10-CM | POA: Diagnosis not present

## 2020-03-27 DIAGNOSIS — E785 Hyperlipidemia, unspecified: Secondary | ICD-10-CM

## 2020-03-27 DIAGNOSIS — R569 Unspecified convulsions: Secondary | ICD-10-CM

## 2020-03-27 DIAGNOSIS — R4701 Aphasia: Secondary | ICD-10-CM

## 2020-03-27 DIAGNOSIS — G250 Essential tremor: Secondary | ICD-10-CM

## 2020-03-27 DIAGNOSIS — I639 Cerebral infarction, unspecified: Secondary | ICD-10-CM

## 2020-03-27 DIAGNOSIS — Z794 Long term (current) use of insulin: Secondary | ICD-10-CM

## 2020-03-27 DIAGNOSIS — E1149 Type 2 diabetes mellitus with other diabetic neurological complication: Secondary | ICD-10-CM

## 2020-03-27 NOTE — Patient Instructions (Addendum)
Continue clopidogrel 75 mg daily  and Crestor  for secondary stroke prevention  Continue to follow up with PCP regarding cholesterol, diabetes and blood pressure management   Continue current regmen with Zoloft and Ativan   Continue keppra 500mg  twice daily for seizure prevention  Continue primidone for tremors  Maintain strict control of hypertension with blood pressure goal below 130/90, diabetes with hemoglobin A1c goal below 6.5% and cholesterol with LDL cholesterol (bad cholesterol) goal below 70 mg/dL. I also advised the patient to eat a healthy diet with plenty of whole grains, cereals, fruits and vegetables, exercise regularly and maintain ideal body weight.  Followup in the future with me in 6 months or call earlier if needed       Thank you for coming to see at [facility]. I hope we have been able to provide you high quality care today.  You may receive a patient satisfaction survey over the next few weeks. We would appreciate your feedback and comments so that we may continue to improve ourselves and the health of our patients.

## 2020-03-27 NOTE — Progress Notes (Signed)
[facility] [address] [place]. [place] [id number] [phone]       FOLLOW-UP NOTE  [name] Date of Birth:  1961-06-02 Medical Record Number:  [id number]   Reason for visit: Follow-up stroke  HPI:   Hospital summary: Brittany FareDenise W Sowersis an 59 y.o.femalewith a PMH of HTN, HLD, DM, Hx of prior CVA and tobacco abuse who presents to the ED as a code stroke with reports of acute onset Global Aphasia, last seen normal at approximately 09:15 this morning 12/06/2017. CT Head code stroke reveals Acute Left Insula Infarct with suspected embolus in distal Left M2. No contraindications were identified in patients history and IV tPA was administered. IR was notified and patient family was briefed by Dr Laurence SlateAroor. Risks and benefits of IR procedure was explained and family wants to proceed. NIH Stroke Scale: 10. Modified Rankin:Score=1. Patient was administered IV TPA and   She subsequently underwent mechanical thrombectomy of left M2 occlusion with TICI 2b reperfusion.gastritis sig echo was unremarkable. Transesophageal echo showed no Source of embolism. LDL cholesterol 81 mg percent. Hemoglobin A1c was 11.5. She was on aspirin prior to admission which was changed to Plavix. Patient did well and was subsequently discharged home with home physical occupational speech therapy. Patient subsequently had symptomatic bradycardia and was seen by Dr. Elberta Fortisamnitz electrophysiologist and underwent pacemaker insertion with removal of the loop recorder on 01/02/18. She has not yet found A. Fib. The patient's Plavix and Lipitor have been inadvertently discontinued following the admission for pacemaker. She states her sugars under good control. She is still on Keppra which was started when she went back on 12/13/17 to the ER with a witnessed seizure. She's had no further breakthrough seizures. She still has residual aphasia but it is improving. Her home speech therapy is recommending outpatient  speech therapy now. Physical and occupational therapy have discharged her. She is still out of work and wants FMLA paperwork filled out.  Update 04/15/2018: Patient is being seen today for follow-up and is accompanied by her two sons.  She continues to have expressive aphasia > receptive aphasia but she continues to participate in speech therapy at neuro rehab clinic.  She states some days are better than others but overall has been improving.  She does notice increased difficulty with expressive aphasia with increased stress or fatigue.  She also has been having difficulty with processing information.  She continues to take aspirin and Plavix without side effects of bleeding or bruising.  Continues to take Lipitor without side effects myalgias.  Blood pressure today satisfactory 125/70.  Patient does not monitor this at home and this was highly recommended.  She does monitor glucose levels at home which have been fluctuating but she does have an appointment scheduled on 05/04/2018 for her first visit with endocrinologist.  She continues to take Keppra 500 mg twice daily without recent seizure activity.  Patient has not returned to work at this time working at [employer] due to continued global aphasia.  Patient deals greatly with the public and on the phone with collections department and patient continues to have difficulty with expressive aphasia especially under stressful situations.  She is currently living at home with her husband, son and father who has Alzheimer's.  Patient states she was told by her cardiologist that she can start driving at this time but educated patient on [place] law stating that she is unable to drive for 6 months after seizure activity.  Patient verbalized understanding.  Patient has been going through stressful situation at home with her son, daughter-in-law and her children as far as divorce and custody battle.  Since this is been occurring, patient has had increase in  fluctuation with her expressive aphasia.  Her receptive aphasia continues to improve but does misunderstand words at times and continues to have issues with processing information.  Denies new or worsening stroke/TIA symptoms.  Update 11/02/2018 : She returns for follow-up after last visit 6 months ago.  She is accompanied by her son-in-law.  Patient has noticed worsening of her hand and voice tremor since she was taken off the primidone following her stroke.  The tremors are present at rest and do increase with action and do interfere with her day-to-day activities.  She was started recently back on primidone 2 weeks ago by primary physician and took 125 mg for 2 weeks.  She increase it 250 mg for couple of days but ran out of it and has not been taking the medicine for the last for 5 days.  She has not had any recurrent stroke or TIA symptoms.  She remains on aspirin and Plavix and tolerating it well without bleeding or bruising.  Her blood pressure is well controlled today it is 129/73.  She states her sugars work controlled and the last A1c was below 7.  She is tolerating Lipitor well without muscle aches and pains and last lipid profile checked in December last year was satisfactory.  She continues to have mild residual aphasia from the stroke but is able to communicate if she speaks slowly and deliberately.  Update 02/09/2019: Follow-up visit today for stroke and essential tremors.  She is currently on primidone 250 mg twice daily with overall improvement of her tremors.  She is now able to use utensils to feed herself without difficulty and no longer interfere with her day-to-day activity.  She is tolerating primidone without side effects.  Residual stroke deficit of expressive aphasia which has recently worsened with increased stress due to recent loss of her father and increase anxiety and worrying over pandemic and protesting.  She continues on Zoloft 200 mg daily which was previously stable.  Her anxiety  is worse at night when there is not as much going on and is unable to leave her house due to pandemic.  She becomes tearful speaking about this.  She has nonfluent speech with frequent hesitancy as she states she knows what words she wants to say but unable to get it out.  She denies any other neurological worsening.  She currently lives with her husband, son and grandchild but is able to maintain all ADLs and IADLs independently.  She continues on Keppra without seizure activity or side effects.  Continues on Plavix and atorvastatin without side effects.  Blood pressure not routinely monitored at home.  Glucose levels have been stable and continues to follow with endocrinology.  Repeat carotid duplex and transcranial Doppler on 11/12/2018 unremarkable.  No further concerns at this time.  03/11/19 visit: Brittany [name] is a 59 year old female who is being seen today for follow up and accompanied by her son.  Discussion at prior visit regarding ongoing hearing difficulties poststroke and increased anxiety causing worsening expressive aphasia.  She did have evaluation by ENT where it was felt as though hearing difficulties are more related to communication deficit with processing of information. Anxiety has improved with use of ativan 0.5mg . she has used approx 4 times over the past month without reported side  effects.  She also endorses overall improvement of her speech with at times waxing/waning but overall large improvement.  She also continues on sertraline 200 mg daily. Tremors has been stable with ongoing use of primidone 250 mg twice daily- able to do more ADLs with eating and writing Keppra 500 mg twice daily without recurrent seizure activity Continues on clopidogrel and atorvastatin without reported side effects for secondary stroke prevention Blood pressure today satisfactory 124/75 Glucose levels have been stable No further concerns at this time   Update 06/24/19 AL:  Brittany [name] is a 59 y.o.  female here today for stroke follow up. She feels that she is doing well. She feels that speech is better. She continues primidone for tremors, keppra for seizure prophylaxis, Plavix and atorvastatin for stroke prevention. She feels tremor is stable. No seizure activity. She is followed closely by PCP for DM and HLD management. Last A1C was 7.6 in 03/2019. Do not have current lipid panel. She continues to suffer from depression. She continues Zoloft 200mg  daily and Ativan as needed. She reports taking it 3-4 times since last being seen by .   Update 09/27/2019: Brittany [name] is a 59 year old female who is being seen today for stroke follow-up.  Residual deficits of expressive aphasia and right hemiparesis worsened with increased stressors or fatigue.  She continues to receive disability due to ongoing deficits and is requesting additional information to be filled out today regarding LTD cognitive assessment form.  She continues to require assistance bathing and dressing as well as having difficulty with reading, writing, comprehension and executive functioning.  MMSE today 22/30.  She continues on Plavix and atorvastatin for secondary stroke prevention without side effects.  Blood pressure today 109/67.  Glucose levels stable.  Continues on Keppra 500 mg twice daily tolerating well without any reoccurring seizure activity.  Remains on primidone 250 mg twice daily for underlying essential tremors previously interfering with ADLs.  She continues to have difficulty with ongoing anxiety and depression despite use of sertraline 200 mg daily and lorazepam as needed.  Denies new or worsening stroke/TIA symptoms.  Update 03/27/2020: Brittany [name] is a very pleasant 59 year old female who returns today for routine follow-up  Left MCA stroke -Residual expressive aphasia and cognitive impairment-stable -Worsens with increased stressors or anxiety -Remains on clopidogrel with mild bruising but no bleeding -Recently  switch statin from atorvastatin to Crestor due to elevated LDL at 158 -Blood pressure today 134/79 -Continues to follow closely with PCP for HTN, HLD and DM management  Post stroke seizure -No recurrent episodes -Continues on Keppra 500 mg twice daily tolerating well  Mild cognitive impairment -Post stroke -symptoms wax/wane -MMSE today 26/30 (prior 22/30)  Depression/anxiety Adjustment disorder -Reports improvement compared to prior visit with ongoing use of sertraline 200 mg nightly and only occasional use of Ativan -Felt as though symptoms are due to recent stroke, death of her father, Covid and family stressors -Not interested in neuropsych or behavioral health evaluation  Essential tremors -Stable -Continues on primidone 250 mg daily tolerating well without side effects   Of note, since prior visit, diagnosed and treated for right eye retinal detachment after presenting to ED with right eye vision loss.  Reports vision has been slowly improving since retinal detachment repair on 12/12/2019 and has follow-up with ophthalmology in the near future     ROS:   14 system review of systems is positive for speech difficulty, memory loss, anxiety and tremor and all other systems negative  PMH:  Past Medical History:  Diagnosis Date   Asthma    Chronic lower back pain    Depression    Family history of adverse reaction to anesthesia    Mother has trouble coming out of anesthesia; patient reports she has no problems   Fibromyalgia    High cholesterol    Presence of permanent cardiac pacemaker    Seizures (HCC) 11/2017   "day after she came home from hospital after having stroke" (01/01/2018)   Stroke (HCC) 12/06/2017   "speech issues since" (01/01/2018)   Type II diabetes mellitus (HCC)     Social History:  Social History   Socioeconomic History   Marital status: Married    Spouse name: [name]   Number of children: 2   Years of education: 14   Highest  education level: Not on file  Occupational History   Not on file  Tobacco Use   Smoking status: Current Every Day Smoker    Packs/day: 1.00    Years: 38.00    Pack years: 38.00    Types: Cigarettes   Smokeless tobacco: Never Used  Vaping Use   Vaping Use: Never used  Substance and Sexual Activity   Alcohol use: Yes    Comment: 01/01/2018 "nothing since stroke 11/2017"   Drug use: No   Sexual activity: Not Currently  Other Topics Concern   Not on file  Social History Narrative   Patient is married ([name]) and lives at home with her family   Patient has two children.   Patient works at [employer].   Patient has a college education.   Patient is right-handed.   Patient drinks 3 cups of coffee M-F.      Social Determinants of Health   Financial Resource Strain:    Difficulty of Paying Living Expenses:   Food Insecurity:    Worried About Programme researcher, broadcasting/film/video in the Last Year:    Barista in the Last Year:   Transportation Needs:    Freight forwarder (Medical):    Lack of Transportation (Non-Medical):   Physical Activity:    Days of Exercise per Week:    Minutes of Exercise per Session:   Stress:    Feeling of Stress :   Social Connections:    Frequency of Communication with Friends and Family:    Frequency of Social Gatherings with Friends and Family:    Attends Religious Services:    Active Member of Clubs or Organizations:    Attends Engineer, structural:    Marital Status:   Intimate Partner Violence:    Fear of Current or Ex-Partner:    Emotionally Abused:    Physically Abused:    Sexually Abused:     Medications:   Current Outpatient Medications on File Prior to Visit  Medication Sig Dispense Refill   acetaminophen (TYLENOL) 500 MG tablet Take 1,000 mg by mouth every 6 (six) hours as needed for fever (pain).     B Complex-Biotin-FA (MULTI-B COMPLEX) CAPS Take one a day ,B complex  multivitamin over the  counter or use prenatal vitamin. 90 capsule 0   clopidogrel (PLAVIX) 75 MG tablet Take 1 tablet (75 mg total) by mouth daily. 90 tablet 3   glucose blood (TRUE METRIX BLOOD GLUCOSE TEST) test strip 1 each by Other route 4 (four) times daily. And lancets 4/day 360 each 3   insulin aspart (NOVOLOG FLEXPEN) 100 UNIT/ML FlexPen 3 times a day (just before  each meal), 04-19-09, and pen needles 4/day 15 mL 11   insulin detemir (LEVEMIR FLEXTOUCH) 100 UNIT/ML FlexPen Inject 18 Units into the skin at bedtime. 5 pen 1   Insulin Pen Needle (BD PEN NEEDLE NANO U/F) 32G X 4 MM MISC 1 each by Other route 4 (four) times daily. 360 each 2   irbesartan (AVAPRO) 150 MG tablet TAKE 1 TABLET(150 MG) BY MOUTH DAILY 90 tablet 3   levETIRAcetam (KEPPRA) 500 MG tablet TAKE 1 TABLET(500 MG) BY MOUTH TWICE DAILY 180 tablet 4   LORazepam (ATIVAN) 0.5 MG tablet Take 0.5-1 tablets (0.25-0.5 mg total) by mouth 2 (two) times daily as needed for anxiety. 45 tablet 0   primidone (MYSOLINE) 250 MG tablet Take 250 mg by mouth daily.     rosuvastatin (CRESTOR) 20 MG tablet Take 1 tablet (20 mg total) by mouth daily. 90 tablet 3   sertraline (ZOLOFT) 100 MG tablet TAKE 2 TABLETS(200 MG) BY MOUTH AT BEDTIME 180 tablet 1   No current facility-administered medications on file prior to visit.    Allergies:  No Known Allergies  Today's Vitals   03/27/20 1402  BP: 134/79  Pulse: (!) 103  Weight: 181 lb (82.1 kg)  Height:  (1.6 m)   Body mass index is 32.06 kg/m.   General: well developed, well nourished,  pleasant middle-age Caucasian female, seated, in no evident distress Head: head normocephalic and atraumatic.   Neck: supple with no carotid or supraclavicular bruits Cardiovascular: regular rate and rhythm, no murmurs Musculoskeletal: no deformity Skin:  no rash/petichiae Vascular:  Normal pulses all extremities   Neurologic Exam Mental Status: Awake and fully alert.  Mild to moderate expressive aphasia  with word hesitancy.  Oriented to place and time. Recent memory impaired and remote memory intact. Attention span, concentration and fund of knowledge appropriate today. Mood and affect appropriate throughout visit  MMSE - Mini Mental State Exam 03/27/2020 09/27/2019  Orientation to time 5 5  Orientation to Place 5 4  Registration 3 3  Attention/ Calculation 4 0  Recall 2 2  Language- name 2 objects 2 2  Language- repeat 0 0  Language- follow 3 step command 3 3  Language- read & follow direction 0 1  Write a sentence 1 1  Copy design 1 1  Copy design-comments 8 animals named 6 animals.  Total score 26 22   Cranial Nerves: Pupils equal, briskly reactive to light. Extraocular movements full without nystagmus. Visual fields full to confrontation. Hearing intact. Facial sensation intact. Face, tongue, palate moves normally and symmetrically.  Motor: Normal bulk and tone. Normal strength in all tested extremity muscles.  Sensory.: intact to touch , pinprick , position and vibratory sensation.  Coordination: Mild action tremors noted in bilateral upper extremities R>L. rapid alternating movements normal in all extremities. Finger-to-nose and heel-to-shin positive for ataxia right side.  Gait and Station: Arises from chair without difficulty. Stance is normal. Gait demonstrates normal stride length and balance Reflexes: 1+ and symmetric. Toes downgoing.      ASSESSMENT/PLAN: 59 year old lady with aphasia secondary to left MCA infarct in April 2019 of cryptogenic etiology treated with IV TPA and mechanical thrombectomy with good recanalization. She also had symptomatic seizure after discharge and residual stroke deficit of expressive aphasia and cognitive impairment currently receiving long-term disability.  Restarted on primidone due to essential tremors which has been stable.  Great difficulty with poststroke depression/anxiety and adjustment disorder which has since stabilized   Left MCA  stroke -  Residual expressive aphasia and cognitive impairment -stable -Continue Plavix and Crestor for secondary stroke prevention -Close PCP follow-up for aggressive stroke risk factor management including HTN, HLD and DM -HLD: LDL goal<70.  Recent LDL 158.  Atorvastatin changed to Crestor.  Advised to ensure follow-up for repeat lipid panel and ongoing monitoring management -HTN: BP goal <130/90. Stable today. Continue current regimen and routine follow-up with PCP -DM: A1c goal<7.0.  Recent A1c 7.3.  Recent adjustments made by PCP.  Continue to follow PCP for monitoring management  Post stroke seizure -Continue Keppra 500 mg twice daily for seizure prophylaxis -refills provided -No recurrent seizure episodes.  Advised to call with any seizure symptoms or events  Essential tremors -Continue primidone 250 mg daily -refill provided  Depression/anxiety disorder Adjustment disorder -Continue Zoloft 200 mg daily and Ativan as needed -refills provided -Declines interest in neuropsych or behavioral health referral    Follow-up in 6 months months or call earlier if needed  I spent 35 minutes of face-to-face and non-face-to-face time with patient.  This included previsit chart review, lab review, study review, order entry, electronic health record documentation, patient education regarding prior stroke, residual deficits, importance of managing stroke risk factors, essential tremors, depression with adjustment disorder and answered all questions to patient satisfaction  Ihor Austin, AGNP-BC  [facility] [address] [place], [place] [address]  Phone [phone] Fax [phone] Note: This document was prepared with digital dictation and possible smart phrase technology. Any transcriptional errors that result from this process are unintentional.

## 2020-03-28 NOTE — Progress Notes (Signed)
I agree with the above plan 

## 2020-03-31 ENCOUNTER — Other Ambulatory Visit: Payer: Self-pay

## 2020-03-31 ENCOUNTER — Telehealth: Payer: Self-pay | Admitting: Adult Health

## 2020-03-31 MED ORDER — PRIMIDONE 250 MG PO TABS: 250.0000 mg | ORAL_TABLET | Freq: Every day | ORAL | 3 refills | Status: DC

## 2020-03-31 NOTE — Telephone Encounter (Signed)
Patient's husband Maleta Pacha called, about refill for primidone (MYSOLINE) 250 MG tablet. Husband stated, [facility] called [facility] 3 days ago. Pharmacy do not have approval to fill the medication. I messaged Ann Maki, RN. Nurse has sent refill to [id number] today. Notified Pt's husband refill has been sent.

## 2020-04-03 NOTE — Telephone Encounter (Signed)
Noted  

## 2020-04-04 ENCOUNTER — Ambulatory Visit (INDEPENDENT_AMBULATORY_CARE_PROVIDER_SITE_OTHER): Payer: BC Managed Care – PPO | Admitting: *Deleted

## 2020-04-04 DIAGNOSIS — I679 Cerebrovascular disease, unspecified: Secondary | ICD-10-CM | POA: Diagnosis not present

## 2020-04-05 LAB — CUP PACEART REMOTE DEVICE CHECK
Battery Remaining Longevity: 157 mo
Battery Voltage: 3.04 V
Brady Statistic AP VP Percent: 0.4 %
Brady Statistic AP VS Percent: 0.32 %
Brady Statistic AS VP Percent: 0.46 %
Brady Statistic AS VS Percent: 98.82 %
Brady Statistic RA Percent Paced: 0.72 %
Brady Statistic RV Percent Paced: 0.87 %
Date Time Interrogation Session: 20210727004137
Implantable Lead Implant Date: 20190425
Implantable Lead Implant Date: 20190425
Implantable Lead Location: 753859
Implantable Lead Location: 753860
Implantable Lead Model: 5076
Implantable Lead Model: 5076
Implantable Pulse Generator Implant Date: 20190425
Lead Channel Impedance Value: 342 Ohm
Lead Channel Impedance Value: 361 Ohm
Lead Channel Impedance Value: 418 Ohm
Lead Channel Impedance Value: 532 Ohm
Lead Channel Pacing Threshold Amplitude: 0.625 V
Lead Channel Pacing Threshold Amplitude: 0.875 V
Lead Channel Pacing Threshold Pulse Width: 0.4 ms
Lead Channel Pacing Threshold Pulse Width: 0.4 ms
Lead Channel Sensing Intrinsic Amplitude: 15.125 mV
Lead Channel Sensing Intrinsic Amplitude: 15.125 mV
Lead Channel Sensing Intrinsic Amplitude: 2.75 mV
Lead Channel Sensing Intrinsic Amplitude: 2.75 mV
Lead Channel Setting Pacing Amplitude: 1.75 V
Lead Channel Setting Pacing Amplitude: 2.5 V
Lead Channel Setting Pacing Pulse Width: 0.4 ms
Lead Channel Setting Sensing Sensitivity: 2 mV

## 2020-04-10 NOTE — Progress Notes (Signed)
Remote pacemaker transmission.   

## 2020-05-22 ENCOUNTER — Other Ambulatory Visit: Payer: Self-pay

## 2020-05-22 ENCOUNTER — Telehealth: Payer: Self-pay | Admitting: Endocrinology

## 2020-05-22 DIAGNOSIS — Z794 Long term (current) use of insulin: Secondary | ICD-10-CM

## 2020-05-22 DIAGNOSIS — E1149 Type 2 diabetes mellitus with other diabetic neurological complication: Secondary | ICD-10-CM

## 2020-05-22 MED ORDER — ACCU-CHEK GUIDE VI STRP: 1.0000 | ORAL_STRIP | Freq: Four times a day (QID) | 12 refills | Status: DC

## 2020-05-22 MED ORDER — ACCU-CHEK GUIDE ME W/DEVICE KIT: 1.0000 | PACK | Freq: Four times a day (QID) | 0 refills | Status: AC

## 2020-05-22 MED ORDER — ACCU-CHEK SOFTCLIX LANCETS MISC: 1.0000 | Freq: Four times a day (QID) | 12 refills | Status: DC

## 2020-05-22 NOTE — Telephone Encounter (Signed)
E-Prescribing Status: Receipt confirmed by pharmacy (05/22/2020 11:32 AM EDT)

## 2020-05-22 NOTE — Telephone Encounter (Signed)
Patient's husband called stating insurance does not cover the one touch, and asked if we could call in a new meter and all supplies for an accu-chek instead.  [id number] [place], [place] - [address] AT [facility] [address] Phone:  [phone]  Fax:  [phone]

## 2020-05-30 ENCOUNTER — Ambulatory Visit: Payer: BC Managed Care – PPO | Admitting: Endocrinology

## 2020-06-29 ENCOUNTER — Ambulatory Visit (INDEPENDENT_AMBULATORY_CARE_PROVIDER_SITE_OTHER): Payer: Medicare PPO | Admitting: Endocrinology

## 2020-06-29 ENCOUNTER — Other Ambulatory Visit: Payer: Self-pay

## 2020-06-29 ENCOUNTER — Encounter: Payer: Self-pay | Admitting: Endocrinology

## 2020-06-29 VITALS — BP 122/82 | HR 102 | Ht 63.0 in | Wt 181.0 lb

## 2020-06-29 DIAGNOSIS — Z23 Encounter for immunization: Secondary | ICD-10-CM

## 2020-06-29 DIAGNOSIS — E1149 Type 2 diabetes mellitus with other diabetic neurological complication: Secondary | ICD-10-CM

## 2020-06-29 DIAGNOSIS — Z794 Long term (current) use of insulin: Secondary | ICD-10-CM | POA: Diagnosis not present

## 2020-06-29 LAB — POCT GLYCOSYLATED HEMOGLOBIN (HGB A1C): Hemoglobin A1C: 7.4 % — AB (ref 4.0–5.6)

## 2020-06-29 LAB — BASIC METABOLIC PANEL
BUN: 16 mg/dL (ref 6–23)
CO2: 26 mEq/L (ref 19–32)
Calcium: 9.4 mg/dL (ref 8.4–10.5)
Chloride: 103 mEq/L (ref 96–112)
Creatinine, Ser: 0.86 mg/dL (ref 0.40–1.20)
GFR: 73.05 mL/min (ref >60.00)
Glucose, Bld: 232 mg/dL — ABNORMAL HIGH (ref 70–99)
Potassium: 3.9 mEq/L (ref 3.5–5.1)
Sodium: 136 mEq/L (ref 135–145)

## 2020-06-29 LAB — TSH: TSH: 1.1 u[IU]/mL (ref 0.35–4.50)

## 2020-06-29 MED ORDER — LEVEMIR FLEXTOUCH 100 UNIT/ML ~~LOC~~ SOPN: 20.0000 [IU] | PEN_INJECTOR | Freq: Every day | SUBCUTANEOUS | 3 refills | Status: DC

## 2020-06-29 NOTE — Patient Instructions (Addendum)
check your blood sugar 4 times a day: before the 3 meals, and at bedtime.  also check if you have symptoms of your blood sugar being too high or too low.  please keep a record of the readings and bring it to your next appointment here (or you can bring the meter itself).  You can write it on any piece of paper.  please call us sooner if your blood sugar goes below 70, or if you have a lot of readings over 200.   Please increase the Levemir to 20 units at bedtime, and: Please continue the same Novolog. It might help you to add non-insulin medication.  Depending on your insurance, these might be expensive for you.  Please think it over and let me know next time.   Blood tests are requested for you today.  We'll let you know about the results.  Please come back for a follow-up appointment in 3 months.

## 2020-06-29 NOTE — Progress Notes (Signed)
Subjective:    Patient ID: Brittany [name], female    DOB: 1960/12/10, 59 y.o.   MRN: [id number]  HPI Pt returns for f/u of diabetes mellitus: DM type: Insulin-requiring type 2 Dx'ed: 0998 Complications: CVA Therapy: insulin since early 2019 GDM: 1987 DKA: never Severe hypoglycemia: never Pancreatitis: never Pancreatic imaging: normal on 2019 CT.   [place]: husband gives pt her insulin (due to CVA--he says he knows how to use syringe and vial).   Other: she gets multiple daily injections.   Interval history: she brings her meter with her cbg's which I have reviewed today.  cbg varies from 94-305.  She never misses the insulin.  pt states she feels well in general.   Past Medical History:  Diagnosis Date  . Asthma   . Chronic lower back pain   . Depression   . Family history of adverse reaction to anesthesia    Mother has trouble coming out of anesthesia; patient reports she has no problems  . Fibromyalgia   . High cholesterol   . Presence of permanent cardiac pacemaker   . Seizures ([place]) 11/2017   "day after she came home from hospital after having stroke" (01/01/2018)  . Stroke [facility]) 12/06/2017   "speech issues since" (01/01/2018)  . Type II diabetes mellitus ([place])     Past Surgical History:  Procedure Laterality Date  . CHOLECYSTECTOMY    . GAS/FLUID EXCHANGE Right 12/12/2019   Procedure: Gas/Fluid Exchange;  Surgeon: Jalene Mullet, MD;  Location: [place];  Service: Ophthalmology;  Laterality: Right;  . INSERT / REPLACE / REMOVE PACEMAKER  01/01/2018  . IR CT HEAD LTD  12/06/2017  . IR PERCUTANEOUS ART THROMBECTOMY/INFUSION INTRACRANIAL INC DIAG ANGIO  12/06/2017  . LOOP RECORDER INSERTION N/A 12/12/2017   Procedure: LOOP RECORDER INSERTION;  Surgeon: Constance Haw, MD;  Location: [place] CV LAB;  Service: Cardiovascular;  Laterality: N/A;  . LOOP RECORDER REMOVAL  01/01/2018  . LOOP RECORDER REMOVAL N/A 01/01/2018   Procedure: LOOP RECORDER REMOVAL;  Surgeon:  Constance Haw, MD;  Location: [place] CV LAB;  Service: Cardiovascular;  Laterality: N/A;  . NECK SURGERY    . PACEMAKER IMPLANT N/A 01/01/2018   Procedure: PACEMAKER IMPLANT;  Surgeon: Constance Haw, MD;  Location: [place] CV LAB;  Service: Cardiovascular;  Laterality: N/A;  . PHOTOCOAGULATION Right 12/12/2019   Procedure: Photocoagulation;  Surgeon: Jalene Mullet, MD;  Location: [place];  Service: Ophthalmology;  Laterality: Right;  . RADIOLOGY WITH ANESTHESIA N/A 12/06/2017   Procedure: CODE STROKE;  Surgeon: Luanne Bras, MD;  Location: [place];  Service: Radiology;  Laterality: N/A;  . SCLERAL BUCKLE WITH POSSIBLE 25 GAUGE PARS PLANA VITRECTOMY Right 12/12/2019   Procedure: SCLERAL BUCKLE WITH  PARS PLANA VITRECTOMY RETINAL DETACHMENT REPAIR;  Surgeon: Jalene Mullet, MD;  Location: [place];  Service: Ophthalmology;  Laterality: Right;  . TEE WITHOUT CARDIOVERSION N/A 12/10/2017   Procedure: TRANSESOPHAGEAL ECHOCARDIOGRAM (TEE);  Surgeon: Jerline Pain, MD;  Location: [facility] ENDOSCOPY;  Service: Cardiovascular;  Laterality: N/A;  . THROMBECTOMY FEMORAL ARTERY Right 12/07/2017   Procedure: THROMBECTOMY RIGHT FEMORAL ARTERY;  Surgeon: Waynetta Sandy, MD;  Location: [place];  Service: Vascular;  Laterality: Right;  . TONSILLECTOMY      Social History   Socioeconomic History  . Marital status: Married    Spouse name: [name]  . Number of children: 2  . Years of education: 64  . Highest education level: Not on file  Occupational History  .  Not on file  Tobacco Use  . Smoking status: Current Every Day Smoker    Packs/day: 1.00    Years: 38.00    Pack years: 38.00    Types: Cigarettes  . Smokeless tobacco: Never Used  Vaping Use  . Vaping Use: Never used  Substance and Sexual Activity  . Alcohol use: Yes    Comment: 01/01/2018 "nothing since stroke 11/2017"  . Drug use: No  . Sexual activity: Not Currently  Other Topics Concern  . Not on file  Social History  Narrative   Patient is married ([name]) and lives at home with her family   Patient has two children.   Patient works at [employer].   Patient has a college education.   Patient is right-handed.   Patient drinks 3 cups of coffee M-F.      Social Determinants of Health   Financial Resource Strain:   . Difficulty of Paying Living Expenses: Not on file  Food Insecurity:   . Worried About Charity fundraiser in the Last Year: Not on file  . Ran Out of Food in the Last Year: Not on file  Transportation Needs:   . Lack of Transportation (Medical): Not on file  . Lack of Transportation (Non-Medical): Not on file  Physical Activity:   . Days of Exercise per Week: Not on file  . Minutes of Exercise per Session: Not on file  Stress:   . Feeling of Stress : Not on file  Social Connections:   . Frequency of Communication with Friends and Family: Not on file  . Frequency of Social Gatherings with Friends and Family: Not on file  . Attends Religious Services: Not on file  . Active Member of Clubs or Organizations: Not on file  . Attends Archivist Meetings: Not on file  . Marital Status: Not on file  Intimate Partner Violence:   . Fear of Current or Ex-Partner: Not on file  . Emotionally Abused: Not on file  . Physically Abused: Not on file  . Sexually Abused: Not on file    Current Outpatient Medications on File Prior to Visit  Medication Sig Dispense Refill  . Accu-Chek Softclix Lancets lancets 1 each by Other route 4 (four) times daily. E11.9 100 each 12  . acetaminophen (TYLENOL) 500 MG tablet Take 1,000 mg by mouth every 6 (six) hours as needed for fever (pain).    . B Complex-Biotin-FA (MULTI-B COMPLEX) CAPS Take one a day ,B complex  multivitamin over the counter or use prenatal vitamin. 90 capsule 0  . Blood Glucose Monitoring Suppl (ACCU-CHEK GUIDE ME) w/Device KIT 1 each by Does not apply route 4 (four) times daily. E11.9 1 kit 0  . clopidogrel (PLAVIX) 75 MG  tablet Take 1 tablet (75 mg total) by mouth daily. 90 tablet 3  . glucose blood (ACCU-CHEK GUIDE) test strip 1 each by Other route 4 (four) times daily. E11.9 100 each 12  . insulin aspart (NOVOLOG FLEXPEN) 100 UNIT/ML FlexPen 3 times a day (just before each meal), 04-19-09, and pen needles 4/day 15 mL 11  . Insulin Pen Needle (BD PEN NEEDLE NANO U/F) 32G X 4 MM MISC 1 each by Other route 4 (four) times daily. 360 each 2  . irbesartan (AVAPRO) 150 MG tablet TAKE 1 TABLET(150 MG) BY MOUTH DAILY 90 tablet 3  . levETIRAcetam (KEPPRA) 500 MG tablet TAKE 1 TABLET(500 MG) BY MOUTH TWICE DAILY 180 tablet 4  . LORazepam (ATIVAN) 0.5  MG tablet Take 0.5-1 tablets (0.25-0.5 mg total) by mouth 2 (two) times daily as needed for anxiety. 45 tablet 0  . primidone (MYSOLINE) 250 MG tablet Take 1 tablet (250 mg total) by mouth daily. 30 tablet 3  . rosuvastatin (CRESTOR) 20 MG tablet Take 1 tablet (20 mg total) by mouth daily. 90 tablet 3  . sertraline (ZOLOFT) 100 MG tablet TAKE 2 TABLETS(200 MG) BY MOUTH AT BEDTIME 180 tablet 1   No current facility-administered medications on file prior to visit.    No Known Allergies  Family History  Problem Relation Age of Onset  . Cancer Mother   . Diabetes Mother   . Heart disease Mother   . Parkinsonism Mother   . Diabetes Father   . Heart disease Father   . Dementia Father   . Cancer Maternal Grandmother 30       lung  . COPD Maternal Aunt     BP 122/82   Pulse (!) 102   Ht 5' 3"  (1.6 m)   Wt 181 lb (82.1 kg)   SpO2 94%   BMI 32.06 kg/m    Review of Systems She denies hypoglycemia    Objective:   Physical Exam VITAL SIGNS:  See vs page GENERAL: no distress Pulses: dorsalis pedis intact bilat.   MSK: no deformity of the feet CV: no leg edema Skin:  no ulcer on the feet.  normal color and temp on the feet. Neuro: sensation is intact to touch on the feet.   Ext: several toenails are absent.     Lab Results  Component Value Date   HGBA1C  7.4 (A) 06/29/2020   Lab Results  Component Value Date   CREATININE 0.70 12/10/2019   BUN 18 12/10/2019   NA 139 12/10/2019   K 4.0 12/10/2019   CL 104 12/10/2019   CO2 24 12/10/2019       Assessment & Plan:  Tachycardia: check TSH Insulin-requiring type 2 DM, with CVA: uncontrolled  Patient Instructions  check your blood sugar 4 times a day: before the 3 meals, and at bedtime.  also check if you have symptoms of your blood sugar being too high or too low.  please keep a record of the readings and bring it to your next appointment here (or you can bring the meter itself).  You can write it on any piece of paper.  please call us sooner if your blood sugar goes below 70, or if you have a lot of readings over 200.   Please increase the Levemir to 20 units at bedtime, and: Please continue the same Novolog. It might help you to add non-insulin medication.  Depending on your insurance, these might be expensive for you.  Please think it over and let me know next time.   Blood tests are requested for you today.  We'll let you know about the results.  Please come back for a follow-up appointment in 3 months.

## 2020-07-04 ENCOUNTER — Ambulatory Visit (INDEPENDENT_AMBULATORY_CARE_PROVIDER_SITE_OTHER): Payer: Medicare PPO

## 2020-07-04 DIAGNOSIS — I442 Atrioventricular block, complete: Secondary | ICD-10-CM

## 2020-07-04 LAB — CUP PACEART REMOTE DEVICE CHECK
Battery Remaining Longevity: 153 mo
Battery Voltage: 3.03 V
Brady Statistic AP VP Percent: 1.44 %
Brady Statistic AP VS Percent: 0.66 %
Brady Statistic AS VP Percent: 1.69 %
Brady Statistic AS VS Percent: 96.21 %
Brady Statistic RA Percent Paced: 2.1 %
Brady Statistic RV Percent Paced: 3.14 %
Date Time Interrogation Session: 20211026004040
Implantable Lead Implant Date: 20190425
Implantable Lead Implant Date: 20190425
Implantable Lead Location: 753859
Implantable Lead Location: 753860
Implantable Lead Model: 5076
Implantable Lead Model: 5076
Implantable Pulse Generator Implant Date: 20190425
Lead Channel Impedance Value: 380 Ohm
Lead Channel Impedance Value: 380 Ohm
Lead Channel Impedance Value: 437 Ohm
Lead Channel Impedance Value: 570 Ohm
Lead Channel Pacing Threshold Amplitude: 0.625 V
Lead Channel Pacing Threshold Amplitude: 1 V
Lead Channel Pacing Threshold Pulse Width: 0.4 ms
Lead Channel Pacing Threshold Pulse Width: 0.4 ms
Lead Channel Sensing Intrinsic Amplitude: 13.875 mV
Lead Channel Sensing Intrinsic Amplitude: 13.875 mV
Lead Channel Sensing Intrinsic Amplitude: 2.625 mV
Lead Channel Sensing Intrinsic Amplitude: 2.625 mV
Lead Channel Setting Pacing Amplitude: 2.25 V
Lead Channel Setting Pacing Amplitude: 2.5 V
Lead Channel Setting Pacing Pulse Width: 0.4 ms
Lead Channel Setting Sensing Sensitivity: 2 mV

## 2020-07-10 NOTE — Progress Notes (Signed)
Remote pacemaker transmission.   

## 2020-07-21 ENCOUNTER — Other Ambulatory Visit: Payer: Self-pay | Admitting: Adult Health

## 2020-07-24 ENCOUNTER — Encounter: Payer: BC Managed Care – PPO | Admitting: Family Medicine

## 2020-08-08 ENCOUNTER — Telehealth: Payer: Self-pay | Admitting: Emergency Medicine

## 2020-08-08 NOTE — Telephone Encounter (Signed)
Called pt and sch med refill appt for 08/11/20. Pt already had toc with Dr. Neva Seat on 10/07/19

## 2020-08-08 NOTE — Telephone Encounter (Signed)
Please schedule a f/u visit for med refills and a TOC appt. No refills until appt

## 2020-08-11 ENCOUNTER — Ambulatory Visit: Payer: BC Managed Care – PPO | Admitting: Registered Nurse

## 2020-08-14 ENCOUNTER — Ambulatory Visit (INDEPENDENT_AMBULATORY_CARE_PROVIDER_SITE_OTHER): Payer: Medicare PPO | Admitting: Registered Nurse

## 2020-08-14 ENCOUNTER — Encounter: Payer: Self-pay | Admitting: Registered Nurse

## 2020-08-14 ENCOUNTER — Other Ambulatory Visit: Payer: Self-pay

## 2020-08-14 VITALS — BP 137/80 | HR 89 | Temp 98.0°F | Resp 18 | Ht 63.0 in | Wt 179.8 lb

## 2020-08-14 DIAGNOSIS — F331 Major depressive disorder, recurrent, moderate: Secondary | ICD-10-CM

## 2020-08-14 DIAGNOSIS — I6930 Unspecified sequelae of cerebral infarction: Secondary | ICD-10-CM | POA: Diagnosis not present

## 2020-08-14 MED ORDER — SERTRALINE HCL 100 MG PO TABS: ORAL_TABLET | ORAL | 1 refills | Status: DC

## 2020-08-14 NOTE — Patient Instructions (Signed)
° ° ° °  If you have lab work done today you will be contacted with your lab results within the next 2 weeks.  If you have not heard from us then please contact us. The fastest way to get your results is to register for My Chart. ° ° °IF you received an x-ray today, you will receive an invoice from [facility]. Please contact [facility] at [phone] with questions or concerns regarding your invoice.  ° °IF you received labwork today, you will receive an invoice from [facility]. Please contact [facility] at [phone] with questions or concerns regarding your invoice.  ° °Our billing staff will not be able to assist you with questions regarding bills from these companies. ° °You will be contacted with the lab results as soon as they are available. The fastest way to get your results is to activate your My Chart account. Instructions are located on the last page of this paperwork. If you have not heard from us regarding the results in 2 weeks, please contact this office. °  ° ° ° °

## 2020-08-14 NOTE — Progress Notes (Signed)
Established Patient Office Visit  Subjective:  Patient ID: Brittany [name], female    DOB: [birth year]  Age: 59 y.o. MRN: [id number]  CC:  Chief Complaint  Patient presents with  . Medication Refill    Patient states she would like to discuss an medication refill. Per patient states she needs zoloft but unsure if anymore needs to be filled    HPI Brittany [name] presents for med refill  Sertraline - has bene on for some time. Taking 262m PO qhs. No acute concerns. Has missed last 2 days as she has been out. Hopes to continue  Tearful throughout visit. Has a complicated relationship with her children, and has played a big role in raising her young grandchildren. However, her children are now taking her grandchildren out of her life. Combined with her residual effects from stroke, which limit the activities she can participate in, she is having worsening depression and anxiety. Interested in anything that may help.  Past Medical History:  Diagnosis Date  . Asthma   . Chronic lower back pain   . Depression   . Family history of adverse reaction to anesthesia    Mother has trouble coming out of anesthesia; patient reports she has no problems  . Fibromyalgia   . High cholesterol   . Presence of permanent cardiac pacemaker   . [place]   "day after she came home from hospital after having stroke" (01/01/2018)  . Stroke ([facility] 12/06/2017   "speech issues since" (01/01/2018)  . [place]     Past Surgical History:  Procedure Laterality Date  . CHOLECYSTECTOMY    . GAS/FLUID EXCHANGE Right 12/12/2019   Procedure: Gas/Fluid Exchange;  Surgeon: PJalene Mullet MD;  [place]  Service: Ophthalmology;  Laterality: Right;  . INSERT / REPLACE / REMOVE PACEMAKER  01/01/2018  . IR CT HEAD LTD  12/06/2017  . IR PERCUTANEOUS ART THROMBECTOMY/INFUSION INTRACRANIAL INC DIAG ANGIO  12/06/2017  . LOOP RECORDER INSERTION N/A 12/12/2017   Procedure: LOOP  RECORDER INSERTION;  Surgeon: CConstance Haw MD;  [place];  Service: Cardiovascular;  Laterality: N/A;  . LOOP RECORDER REMOVAL  01/01/2018  . LOOP RECORDER REMOVAL N/A 01/01/2018   Procedure: LOOP RECORDER REMOVAL;  Surgeon: CConstance Haw MD;  [place];  Service: Cardiovascular;  Laterality: N/A;  . NECK SURGERY    . PACEMAKER IMPLANT N/A 01/01/2018   Procedure: PACEMAKER IMPLANT;  Surgeon: CConstance Haw MD;  [place];  Service: Cardiovascular;  Laterality: N/A;  . PHOTOCOAGULATION Right 12/12/2019   Procedure: Photocoagulation;  Surgeon: PJalene Mullet MD;  [place]  Service: Ophthalmology;  Laterality: Right;  . RADIOLOGY WITH ANESTHESIA N/A 12/06/2017   Procedure: CODE STROKE;  Surgeon: DLuanne Bras MD;  [place]  Service: Radiology;  Laterality: N/A;  . SCLERAL BUCKLE WITH POSSIBLE 25 GAUGE PARS PLANA VITRECTOMY Right 12/12/2019   Procedure: SCLERAL BUCKLE WITH  PARS PLANA VITRECTOMY RETINAL DETACHMENT REPAIR;  Surgeon: PJalene Mullet MD;  [place]  Service: Ophthalmology;  Laterality: Right;  . TEE WITHOUT CARDIOVERSION N/A 12/10/2017   Procedure: TRANSESOPHAGEAL ECHOCARDIOGRAM (TEE);  Surgeon: SJerline Pain MD;  [facility]  Service: Cardiovascular;  Laterality: N/A;  . THROMBECTOMY FEMORAL ARTERY Right 12/07/2017   Procedure: THROMBECTOMY RIGHT FEMORAL ARTERY;  Surgeon: CWaynetta Sandy MD;  [place]  Service: Vascular;  Laterality: Right;  . TONSILLECTOMY  Family History  Problem Relation Age of Onset  . Cancer Mother   . Diabetes Mother   . Heart disease Mother   . Parkinsonism Mother   . Diabetes Father   . Heart disease Father   . Dementia Father   . Cancer Maternal Grandmother 27       lung  . COPD Maternal Aunt     Social History   Socioeconomic History  . Marital status: Married    Spouse name: [name]  . Number of  children: 2  . Years of education: 35  . Highest education level: Not on file  Occupational History  . Not on file  Tobacco Use  . Smoking status: Current Every Day Smoker    Packs/day: 1.00    Years: 38.00    Pack years: 38.00    Types: Cigarettes  . Smokeless tobacco: Never Used  Vaping Use  . Vaping Use: Never used  Substance and Sexual Activity  . Alcohol use: Yes    Comment: 01/01/2018 "nothing since stroke 11/2017"  . Drug use: No  . Sexual activity: Not Currently  Other Topics Concern  . Not on file  Social History Narrative   Patient is married ([name]) and lives at home with her family   Patient has two children.   Patient works at [employer].   Patient has a college education.   Patient is right-handed.   Patient drinks 3 cups of coffee M-F.      Social Determinants of Health   Financial Resource Strain:   . Difficulty of Paying Living Expenses: Not on file  Food Insecurity:   . Worried About Charity fundraiser in the Last Year: Not on file  . Ran Out of Food in the Last Year: Not on file  Transportation Needs:   . Lack of Transportation (Medical): Not on file  . Lack of Transportation (Non-Medical): Not on file  Physical Activity:   . Days of Exercise per Week: Not on file  . Minutes of Exercise per Session: Not on file  Stress:   . Feeling of Stress : Not on file  Social Connections:   . Frequency of Communication with Friends and Family: Not on file  . Frequency of Social Gatherings with Friends and Family: Not on file  . Attends Religious Services: Not on file  . Active Member of Clubs or Organizations: Not on file  . Attends Archivist Meetings: Not on file  . Marital Status: Not on file  Intimate Partner Violence:   . Fear of Current or Ex-Partner: Not on file  . Emotionally Abused: Not on file  . Physically Abused: Not on file  . Sexually Abused: Not on file    Outpatient Medications Prior to Visit  Medication Sig Dispense  Refill  . Accu-Chek Softclix Lancets lancets 1 each by Other route 4 (four) times daily. E11.9 100 each 12  . acetaminophen (TYLENOL) 500 MG tablet Take 1,000 mg by mouth every 6 (six) hours as needed for fever (pain).    . B Complex-Biotin-FA (MULTI-B COMPLEX) CAPS Take one a day ,B complex  multivitamin over the counter or use prenatal vitamin. 90 capsule 0  . Blood Glucose Monitoring Suppl (ACCU-CHEK GUIDE ME) w/Device KIT 1 each by Does not apply route 4 (four) times daily. E11.9 1 kit 0  . clopidogrel (PLAVIX) 75 MG tablet Take 1 tablet (75 mg total) by mouth daily. 90 tablet 3  . glucose blood (ACCU-CHEK GUIDE) test  strip 1 each by Other route 4 (four) times daily. E11.9 100 each 12  . insulin aspart (NOVOLOG FLEXPEN) 100 UNIT/ML FlexPen 3 times a day (just before each meal), 04-19-09, and pen needles 4/day 15 mL 11  . insulin detemir (LEVEMIR FLEXTOUCH) 100 UNIT/ML FlexPen Inject 20 Units into the skin at bedtime. 30 mL 3  . Insulin Pen Needle (BD PEN NEEDLE NANO U/F) 32G X 4 MM MISC 1 each by Other route 4 (four) times daily. 360 each 2  . irbesartan (AVAPRO) 150 MG tablet TAKE 1 TABLET(150 MG) BY MOUTH DAILY 90 tablet 3  . levETIRAcetam (KEPPRA) 500 MG tablet TAKE 1 TABLET(500 MG) BY MOUTH TWICE DAILY 180 tablet 4  . LORazepam (ATIVAN) 0.5 MG tablet Take 0.5-1 tablets (0.25-0.5 mg total) by mouth 2 (two) times daily as needed for anxiety. 45 tablet 0  . primidone (MYSOLINE) 250 MG tablet TAKE 1 TABLET(250 MG) BY MOUTH DAILY 30 tablet 1  . rosuvastatin (CRESTOR) 20 MG tablet Take 1 tablet (20 mg total) by mouth daily. 90 tablet 3  . sertraline (ZOLOFT) 100 MG tablet TAKE 2 TABLETS(200 MG) BY MOUTH AT BEDTIME 180 tablet 1   No facility-administered medications prior to visit.    No Known Allergies  ROS Review of Systems  Constitutional: Negative.   HENT: Negative.   Eyes: Negative.   Respiratory: Negative.   Cardiovascular: Negative.   Gastrointestinal: Negative.   Endocrine:  Negative.   Genitourinary: Negative.   Musculoskeletal: Negative.   Skin: Negative.   Allergic/Immunologic: Negative.   Neurological: Negative.   Hematological: Negative.   Psychiatric/Behavioral: Positive for dysphoric mood. The patient is nervous/anxious.       Objective:    Physical Exam Vitals and nursing note reviewed.  Constitutional:      General: She is not in acute distress.    Appearance: Normal appearance. She is normal weight. She is not ill-appearing, toxic-appearing or diaphoretic.  Cardiovascular:     Rate and Rhythm: Normal rate and regular rhythm.     Heart sounds: Normal heart sounds. No murmur heard.  No friction rub. No gallop.   Pulmonary:     Effort: Pulmonary effort is normal. No respiratory distress.     Breath sounds: Normal breath sounds. No stridor. No wheezing, rhonchi or rales.  Chest:     Chest wall: No tenderness.  Skin:    General: Skin is warm and dry.  Neurological:     General: No focal deficit present.     Mental Status: She is alert and oriented to person, place, and time. Mental status is at baseline.  Psychiatric:        Mood and Affect: Mood normal.        Behavior: Behavior normal.        Thought Content: Thought content normal.        Judgment: Judgment normal.     BP 137/80   Pulse 89   Temp 98 F (36.7 C) (Temporal)   Resp 18   Ht 5' 3"  (1.6 m)   Wt 179 lb 12.8 oz (81.6 kg)   SpO2 100%   BMI 31.85 kg/m  Wt Readings from Last 3 Encounters:  08/14/20 179 lb 12.8 oz (81.6 kg)  06/29/20 181 lb (82.1 kg)  03/27/20 181 lb (82.1 kg)     There are no preventive care reminders to display for this patient.  There are no preventive care reminders to display for this patient.  Lab Results  Component Value  Date   TSH 1.10 06/29/2020   Lab Results  Component Value Date   WBC 9.7 12/10/2019   HGB 13.6 12/10/2019   HCT 40.0 12/10/2019   MCV 91.4 12/10/2019   PLT 278 12/10/2019   Lab Results  Component Value Date    NA 136 06/29/2020   K 3.9 06/29/2020   CO2 26 06/29/2020   GLUCOSE 232 (H) 06/29/2020   BUN 16 06/29/2020   CREATININE 0.86 06/29/2020   BILITOT 0.4 12/10/2019   ALKPHOS 104 12/10/2019   AST 14 (L) 12/10/2019   ALT 13 12/10/2019   PROT 7.0 12/10/2019   ALBUMIN 3.8 12/10/2019   CALCIUM 9.4 06/29/2020   ANIONGAP 12 12/10/2019   GFR 73.05 06/29/2020   Lab Results  Component Value Date   CHOL 239 (H) 11/22/2019   Lab Results  Component Value Date   HDL 54 11/22/2019   Lab Results  Component Value Date   LDLCALC 158 (H) 11/22/2019   Lab Results  Component Value Date   TRIG 148 11/22/2019   Lab Results  Component Value Date   CHOLHDL 4.4 11/22/2019   Lab Results  Component Value Date   HGBA1C 7.4 (A) 06/29/2020      Assessment & Plan:   Problem List Items Addressed This Visit      Other   Depression - Primary   Relevant Medications   sertraline (ZOLOFT) 100 MG tablet   Other Relevant Orders   Ambulatory referral to Psychology    Other Visit Diagnoses    History of CVA with residual deficit       Relevant Orders   Ambulatory referral to Occupational Therapy      Meds ordered this encounter  Medications  . sertraline (ZOLOFT) 100 MG tablet    Sig: TAKE 2 TABLETS(200 MG) BY MOUTH AT BEDTIME    Dispense:  180 tablet    Refill:  1    Order Specific Question:   Supervising Provider    Answer:   Carlota Raspberry, JEFFREY R [[id number]]    Follow-up: No follow-ups on file.   PLAN  Refill sertraline  Refer to counseling  Refer to OT  Return in 3 mo for med check  Patient encouraged to call clinic with any questions, comments, or concerns.  Maximiano Coss, NP

## 2020-09-11 ENCOUNTER — Ambulatory Visit: Payer: Medicare PPO | Attending: Registered Nurse | Admitting: Occupational Therapy

## 2020-09-11 ENCOUNTER — Encounter: Payer: Self-pay | Admitting: Occupational Therapy

## 2020-09-11 ENCOUNTER — Other Ambulatory Visit: Payer: Self-pay

## 2020-09-11 ENCOUNTER — Telehealth: Payer: Self-pay | Admitting: Occupational Therapy

## 2020-09-11 DIAGNOSIS — R278 Other lack of coordination: Secondary | ICD-10-CM | POA: Diagnosis not present

## 2020-09-11 DIAGNOSIS — M6281 Muscle weakness (generalized): Secondary | ICD-10-CM | POA: Diagnosis not present

## 2020-09-11 DIAGNOSIS — R251 Tremor, unspecified: Secondary | ICD-10-CM | POA: Insufficient documentation

## 2020-09-11 NOTE — Telephone Encounter (Signed)
Brittany Agee, NP:  Corie Chiquito was seen today for occupational therapy evaluation.  She also reports that she feels that her speech has declined and would like to be seen by speech therapy again to address (last seen for speech therapy in 2019).  If you agree, could you please place referral for speech therapy via Epic?  Thank you,  Willa Frater, OTR/L [facility] [address] [place], [place]  [address] [phone] phone [phone] 09/11/20 3:08 PM

## 2020-09-11 NOTE — Therapy (Signed)
[facility] [address] [place], [place], [address] Phone: [phone]   Fax:  [phone]  Occupational Therapy Evaluation  Patient Details  Name: Brittany [name] MRN: [id number] Date of Birth: 1961/04/22 Referring Provider (OT): Janeece Agee, NP   Encounter Date: 09/11/2020   OT End of Session - 09/11/20 1435    Visit Number 1    Number of Visits 11    Date for OT Re-Evaluation 10/11/20    Authorization Type [employer]--needs Auth    Authorization Time Period cert. date 09/11/20-12/08/20    Authorization - Visit Number 1    Authorization - Number of Visits 10    Progress Note Due on Visit 10    OT Start Time 1315    OT Stop Time 1400    OT Time Calculation (min) 45 min    Activity Tolerance Patient tolerated treatment well    Behavior During Therapy WFL for tasks assessed/performed           Past Medical History:  Diagnosis Date  . Asthma   . Chronic lower back pain   . Depression   . Family history of adverse reaction to anesthesia    Mother has trouble coming out of anesthesia; patient reports she has no problems  . Fibromyalgia   . High cholesterol   . Presence of permanent cardiac pacemaker   . Seizures (HCC) 11/2017   "day after she came home from hospital after having stroke" (01/01/2018)  . Stroke [facility]) 12/06/2017   "speech issues since" (01/01/2018)  . Type II diabetes mellitus (HCC)     Past Surgical History:  Procedure Laterality Date  . CHOLECYSTECTOMY    . GAS/FLUID EXCHANGE Right 12/12/2019   Procedure: Gas/Fluid Exchange;  Surgeon: Carmela Rima, MD;  Location: [facility] OR;  Service: Ophthalmology;  Laterality: Right;  . INSERT / REPLACE / REMOVE PACEMAKER  01/01/2018  . IR CT HEAD LTD  12/06/2017  . IR PERCUTANEOUS ART THROMBECTOMY/INFUSION INTRACRANIAL INC DIAG ANGIO  12/06/2017  . LOOP RECORDER INSERTION N/A 12/12/2017   Procedure: LOOP RECORDER INSERTION;  Surgeon: Regan Lemming,  MD;  [facility] INVASIVE CV LAB;  Service: Cardiovascular;  Laterality: N/A;  . LOOP RECORDER REMOVAL  01/01/2018  . LOOP RECORDER REMOVAL N/A 01/01/2018   Procedure: LOOP RECORDER REMOVAL;  Surgeon: Regan Lemming, MD;  Location: [facility] INVASIVE CV LAB;  Service: Cardiovascular;  Laterality: N/A;  . NECK SURGERY    . PACEMAKER IMPLANT N/A 01/01/2018   Procedure: PACEMAKER IMPLANT;  Surgeon: Regan Lemming, MD;  [facility]: [facility] INV[facility] CV LAB;  Service: Cardiovascular;  Laterality: N/A;  . PHOTOCOAGULATION Right 12/12/2019   Procedure: Photocoagulation;  Surgeon: Carmela Rima, MD;  Location: [facility] OR;  Service: Ophthalmology;  Laterality: Right;  . RADIOLOGY WITH ANESTHESIA N/A 12/06/2017   Procedure: CODE STROKE;  Surgeon: Julieanne Cotton, MD;  Location: [facility] OR;  Service: Radiology;  Laterality: N/A;  . SCLERAL BUCKLE WITH POSSIBLE 25 GAUGE PARS PLANA VITRECTOMY Right 12/12/2019   Procedure: SCLERAL BUCKLE WITH  PARS PLANA VITRECTOMY RETINAL DETACHMENT REPAIR;  Surgeon: Carmela Rima, MD;  Location: [facility] OR;  Service: Ophthalmology;  Laterality: Right;  . TEE WITHOUT CARDIOVERSION N/A 12/10/2017   Procedure: TRANSESOPHAGEAL ECHOCARDIOGRAM (TEE);  Surgeon: Jake Bathe, MD;  Location: [facility] ENDOSCOPY;  Service: Cardiovascular;  Laterality: N/A;  . THROMBECTOMY FEMORAL ARTERY Right 12/07/2017   Procedure: THROMBECTOMY RIGHT FEMORAL ARTERY;  Surgeon: Maeola Harman, MD;  Location: [facility] OR;  Service: Vascular;  Laterality: Right;  . TONSILLECTOMY      There were no vitals filed for this visit.   Subjective Assessment - 09/11/20 1317    Subjective  I could talk better when I first had my stroke.  I could do computers better, it takes me all day now.  Pt also feels like speech has gotten worse since she last saw speech therapy.    Pertinent History Hx of CVA 2019 with residual deficit.PMH:  Asthma, chronic lower back pain, depression, fibromyalgia, high cholesterol, Cardiac PACEMAKER,  seizures, hx of CVA (01/01/2018), DM, hx of tremors (?essential tremor), R eye vision with retinal detachment 12/12/19    Patient Stated Goals do more like before my stroke (crafts)    Currently in Pain? No/denies             [facility] OT Assessment - 09/11/20 0001      Assessment   Medical Diagnosis Hx of CVA with residual deficits    Referring Provider (OT) Janeece Agee, NP    Onset Date/Surgical Date 08/14/20   OT referral/PCP appt (CVA 2019)   Hand Dominance Right    Prior Therapy speech therapy, no previous outpatient OT      Precautions   Precautions None      Balance Screen   Has the patient fallen in the past 6 months Yes    How many times? 1, tripped over something in yard      Home  Environment   Family/patient expects to be discharged to: Private residence    Lives With Family   husband, son, and grandchildren (5 y.o. twins and 3 y.o.)     Prior Function   Level of Independence Independent    Vocation On disability   worked for [employer] (typing and on phone)   Leisure crafts, scrapbooking, swimming (has pool), take grandchildren to park (hasn't been able to do since Covid), used to like to listen to music and read to grandchildren      ADL   Eating/Feeding Modified independent   uses Copy, uses spork   Freight forwarder Modified independent    Lower Body Bathing Modified independent    Upper Body Dressing --   mod I   Lower Body Dressing Increased time   mod I   Patent examiner independent    Toileting - Clothing Manipulation Modified independent    Toileting -  Hygiene Modified Independent    Tub/Shower Transfer Modified independent      IADL   Shopping --   mod I, but takes a long time and fatigued   Light Housekeeping --   mod I, except needs assist for carrying laundry basket   Meal Prep Does not utilize stove or oven   microwave, cold meal prep   Community Mobility Drives own vehicle    Medication  Management --   husband performs   Landscape architect --   mod I on computer with incr time due to difficulty typing     Mobility   Mobility Status Independent      Written Expression   Dominant Hand Right    Handwriting 100% legible   with tremors noted, pt reports incr difficulty with writing smaller     Vision - History   Additional Comments WFL now (hx of R retinal detachment/repair 12/12/19)      Cognition   Overall Cognitive Status Difficult to assess   due to expressive aphasia   Memory  Impaired    Memory Impairment Decreased short term memory    Cognition Comments Will assess further in functional context prn      Sensation   Light Touch Impaired by gross assessment   per pt report numbness in fingertips     Coordination   9 Hole Peg Test Right;Left    Right 9 Hole Peg Test 50sec, multiple drops    Left 9 Hole Peg Test 48sec    Box and Blocks 50sec    Tremors Pt reports hx of (?essential) tremors      ROM / Strength   AROM / PROM / Strength AROM;Strength      AROM   Overall AROM  Within functional limits for tasks performed    Overall AROM Comments BUEs      Strength   Overall Strength Deficits    Overall Strength Comments LUE proximal strength 5/5, RUE shoulder strength 4 to 4+/5, biceps/triceps 5/5      Hand Function   Right Hand Grip (lbs) 39.2    Left Hand Grip (lbs) 30                           OT Education - 09/11/20 1434    Education Details OT POC and eval results; Pt would like speech therapy referral (will attempt to request from referring provider)    Person(s) Educated Patient    Methods Explanation    Comprehension Verbalized understanding               OT Long Term Goals - 09/11/20 1455      OT LONG TERM GOAL #1   Title Pt will be independent with HEP for coordination.    Time 5    Period Weeks    Status New      OT LONG TERM GOAL #2   Title Pt will be indpendent with R shoulder strengthening HEP (including wt.  bearing).    Time 5    Period Weeks    Status New      OT LONG TERM GOAL #3   Title Pt will verbalize understanding of compensation strategies/AE for tremors during ADLs/IADLs (including writing, eating, using computer, and crafts).    Time 5    Period Weeks    Status New      OT LONG TERM GOAL #4   Title Pt will improve R hand coordination for ADLs as shown by improving time on 9-hole peg test by at least 5 sec.    Baseline 52sec.    Time 5    Period Weeks    Status New      OT LONG TERM GOAL #5   Title Pt will verbalize understanding of craft-type activies that are appropriate and strategies to participate.    Time 5    Period Weeks    Status New                 Plan - 09/11/20 1438    Clinical Impression Statement Pt is an 60 y.o. female with hx of CVA with residual deficit.  Pt with PMH that includes:Asthma, chronic lower back pain, depression, fibromyalgia, high cholesterol, Cardiac PACEMAKER, seizures, hx of CVA (01/01/2018), DM, hx of tremors (?essential tremor), R eye vision with retinal detachment 12/12/19.  Pt reports that she feels that she has declined with coordination and speech during Covid-19 restrictions and that she would like to be able to do more activities that she  previously enjoyed.   Pt with aphasia.   Pt presents today with decr strength, decr coordination, tremors.  Pt would benefit from occupational therapy to address these deficits for improved UE functional use, IADL performance, and ability to perform desired leisure activities.    OT Occupational Profile and History Detailed Assessment- Review of Records and additional review of physical, cognitive, psychosocial history related to current functional performance    Occupational performance deficits (Please refer to evaluation for details): ADL's;IADL's;Leisure    Body Structure / Function / Physical Skills ADL;Strength;Dexterity;UE functional use;IADL;Sensation;Coordination;FMC    Rehab Potential Fair     Clinical Decision Making Several treatment options, min-mod task modification necessary    Comorbidities Affecting Occupational Performance: May have comorbidities impacting occupational performance    Modification or Assistance to Complete Evaluation  Min-Moderate modification of tasks or assist with assess necessary to complete eval    OT Frequency 2x / week    OT Duration --   x5 weeks +eval   OT Treatment/Interventions Self-care/ADL training;DME and/or AE instruction;Therapeutic activities;Therapeutic exercise;Neuromuscular education;Patient/family education    Plan initiate coordination HEP, compensation strategies for tremors    Recommended Other Services speech therapy--pt reports speech decline    Consulted and Agree with Plan of Care Patient           Patient will benefit from skilled therapeutic intervention in order to improve the following deficits and impairments:   Body Structure / Function / Physical Skills: ADL,Strength,Dexterity,UE functional use,IADL,Sensation,Coordination,FMC       Visit Diagnosis: Other lack of coordination  Tremor  Muscle weakness (generalized)    Problem List Patient Active Problem List   Diagnosis Date Noted  . Bilateral high frequency sensorineural hearing loss 02/25/2019  . Non compliance with medical treatment 03/23/2018  . History of tobacco use disorder 01/01/2018  . Syncope and collapse 01/01/2018  . 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 Acute right hemiparesis (HCC) 12/12/2017  . History of completed stroke 12/12/2017  . Leukocytosis 12/12/2017  . Hypokalemia 12/12/2017  . Hypotension   . 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 Poor compliance with CPAP treatment 03/04/2017  . OSA and COPD overlap syndrome (HCC) 03/04/2017  . Simple chronic bronchitis (HCC)  03/04/2017  . MCI (mild cognitive impairment) 11/27/2016  . Left sided lacunar infarction (HCC) 03/11/2014  . Cerebrovascular small vessel disease 03/11/2014  . Insomnia, idiopathic 12/20/2013  . Snoring 12/06/2013  . Essential hypertension, benign 10/26/2012  . Hyperlipidemia LDL goal <70 10/26/2012  . Polypharmacy 10/26/2012  . DM (diabetes mellitus) (HCC) 10/13/2011  . Tremor 10/13/2011  . GERD (gastroesophageal reflux disease) 10/13/2011  . Depression 10/13/2011    [facility] 09/11/2020, 3:04 PM  [facility] [address] [place], [place], [address] Phone: [phone]   Fax:  [phone]  Name: Brittany [name] MRN: [id number] Date of Birth: September 27, 1960   Willa Frater, OTR/L [facility] [address] [place], [place]  [address] [phone] phone [phone] 09/11/20 3:04 PM

## 2020-09-12 ENCOUNTER — Other Ambulatory Visit: Payer: Self-pay | Admitting: Registered Nurse

## 2020-09-12 DIAGNOSIS — I6930 Unspecified sequelae of cerebral infarction: Secondary | ICD-10-CM

## 2020-09-12 NOTE — Telephone Encounter (Signed)
Yes - I'd be more than happy to place the referral  Thank you  [place]

## 2020-09-12 NOTE — Progress Notes (Signed)
Pt requested OT to have speech referral  I think this is reasonable Referral placed  Jari Sportsman, NP

## 2020-09-14 ENCOUNTER — Ambulatory Visit: Payer: Medicare PPO | Admitting: Occupational Therapy

## 2020-09-14 ENCOUNTER — Other Ambulatory Visit: Payer: Self-pay

## 2020-09-14 DIAGNOSIS — R251 Tremor, unspecified: Secondary | ICD-10-CM | POA: Diagnosis not present

## 2020-09-14 DIAGNOSIS — R278 Other lack of coordination: Secondary | ICD-10-CM | POA: Diagnosis not present

## 2020-09-14 DIAGNOSIS — M6281 Muscle weakness (generalized): Secondary | ICD-10-CM

## 2020-09-14 NOTE — Patient Instructions (Signed)
    Coordination Activities  Perform the following activities for 15-20 minutes 1 times per day with both hand(s), focus on right hand more.   Rotate ball in fingertips (clockwise and counter-clockwise).  Flip cards 1 at a time as fast as you can.  Deal cards with your thumb (Hold deck in hand and push card off top with thumb).  Shuffle cards.  Pick up coins, buttons, marbles, dried beans/pasta of different sizes and place in container.  Pick up checkers and stack.   Pick up coins one at a time until you get 5-10 in your hand, then move coins from palm to fingertips to place in container one at a time.  Practice writing and/or typing, coloring  Peel off stickers, cut out shapes with scissors, use stamps

## 2020-09-14 NOTE — Therapy (Signed)
[facility] [address] [place], [place], [address] Phone: [phone]   Fax:  [phone]  Occupational Therapy Treatment  Patient Details  Name: Brittany [name] MRN: [id number] Date of Birth: Dec 29, 1960 Referring Provider (OT): Janeece Agee, NP   Encounter Date: 09/14/2020   OT End of Session - 09/14/20 1323    Visit Number 2    Number of Visits 11    Date for OT Re-Evaluation 10/11/20    Authorization Type [employer]--needs Auth    Authorization Time Period cert. date 09/11/20-12/08/20    Authorization - Visit Number 2    Authorization - Number of Visits 10    Progress Note Due on Visit 10    OT Start Time 1320    OT Stop Time 1400    OT Time Calculation (min) 40 min    Activity Tolerance Patient tolerated treatment well    Behavior During Therapy WFL for tasks assessed/performed           Past Medical History:  Diagnosis Date  . Asthma   . Chronic lower back pain   . Depression   . Family history of adverse reaction to anesthesia    Mother has trouble coming out of anesthesia; patient reports she has no problems  . Fibromyalgia   . High cholesterol   . Presence of permanent cardiac pacemaker   . Seizures (HCC) 11/2017   "day after she came home from hospital after having stroke" (01/01/2018)  . Stroke [facility]) 12/06/2017   "speech issues since" (01/01/2018)  . Type II diabetes mellitus (HCC)     Past Surgical History:  Procedure Laterality Date  . CHOLECYSTECTOMY    . GAS/FLUID EXCHANGE Right 12/12/2019   Procedure: Gas/Fluid Exchange;  Surgeon: Carmela Rima, MD;  Location: [facility] OR;  Service: Ophthalmology;  Laterality: Right;  . INSERT / REPLACE / REMOVE PACEMAKER  01/01/2018  . IR CT HEAD LTD  12/06/2017  . IR PERCUTANEOUS ART THROMBECTOMY/INFUSION INTRACRANIAL INC DIAG ANGIO  12/06/2017  . LOOP RECORDER INSERTION N/A 12/12/2017   Procedure: LOOP RECORDER INSERTION;  Surgeon: Regan Lemming,  MD;  [facility] INVAS[facility] CV LAB;  Service: Cardiovascular;  Laterality: N/A;  . LOOP RECORDER REMOVAL  01/01/2018  . LOOP RECORDER REMOVAL N/A 01/01/2018   Procedure: LOOP RECORDER REMOVAL;  Surgeon: Regan Lemming, MD;  [facility]: [facility] INVAS[facility] CV LAB;  Service: Cardiovascular;  Laterality: N/A;  . NECK SURGERY    . PACEMAKER IMPLANT N/A 01/01/2018   Procedure: PACEMAKER IMPLANT;  Surgeon: Regan Lemming, MD;  [facility] LAB;  Service: Cardiovascular;  Laterality: N/A;  . PHOTOCOAGULATION Right 12/12/2019   Procedure: Photocoagulation;  Surgeon: Carmela Rima, MD;  Location: [facility] OR;  Service: Ophthalmology;  Laterality: Right;  . RADIOLOGY WITH ANESTHESIA N/A 12/06/2017   Procedure: CODE STROKE;  Surgeon: Julieanne Cotton, MD;  Location: [facility] OR;  Service: Radiology;  Laterality: N/A;  . SCLERAL BUCKLE WITH POSSIBLE 25 GAUGE PARS PLANA VITRECTOMY Right 12/12/2019   Procedure: SCLERAL BUCKLE WITH  PARS PLANA VITRECTOMY RETINAL DETACHMENT REPAIR;  Surgeon: Carmela Rima, MD;  Location: [facility] OR;  Service: Ophthalmology;  Laterality: Right;  . TEE WITHOUT CARDIOVERSION N/A 12/10/2017   Procedure: TRANSESOPHAGEAL ECHOCARDIOGRAM (TEE);  Surgeon: Jake Bathe, MD;  Location: [facility] ENDOSCOPY;  Service: Cardiovascular;  Laterality: N/A;  . THROMBECTOMY FEMORAL ARTERY Right 12/07/2017   Procedure: THROMBECTOMY RIGHT FEMORAL ARTERY;  Surgeon: Maeola Harman, MD;  Location: [facility] OR;  Service: Vascular;  Laterality: Right;  . TONSILLECTOMY      There were no vitals filed for this visit.   Subjective Assessment - 09/14/20 1322    Subjective  Pt has copper compression glove and feels like it helps tremors a little.    Pertinent History Hx of CVA 2019 with residual deficit.PMH:  Asthma, chronic lower back pain, depression, fibromyalgia, high cholesterol, Cardiac PACEMAKER, seizures, hx of CVA (01/01/2018), DM, hx of tremors (?essential tremor), R eye vision with retinal detachment  12/12/19    Patient Stated Goals do more like before my stroke (crafts)    Currently in Pain? No/denies             Discussed adaptive strategies for scrapbooking:  Using paper cutter, use of large shape cut outs, peeling off backing of stickers first, using colored pencils or felt-tipped markers/pens.  Also discussed ways to incorporate kids with coordination activities by using crafts/games.  Pt instructed to try stabilizing elbow/forearm against body or on table during writing or coordination tasks.   Pt verbalized understanding.      OT Education - 09/14/20 1332    Education Details Coordination HEP--see pt instructions    Person(s) Educated Patient    Methods Explanation;Demonstration;Handout;Verbal cues    Comprehension Verbalized understanding;Returned demonstration;Verbal cues required               OT Long Term Goals - 09/11/20 1455      OT LONG TERM GOAL #1   Title Pt will be independent with HEP for coordination.    Time 5    Period Weeks    Status New      OT LONG TERM GOAL #2   Title Pt will be indpendent with R shoulder strengthening HEP (including wt. bearing).    Time 5    Period Weeks    Status New      OT LONG TERM GOAL #3   Title Pt will verbalize understanding of compensation strategies/AE for tremors during ADLs/IADLs (including writing, eating, using computer, and crafts).    Time 5    Period Weeks    Status New      OT LONG TERM GOAL #4   Title Pt will improve R hand coordination for ADLs as shown by improving time on 9-hole peg test by at least 5 sec.    Baseline 52sec.    Time 5    Period Weeks    Status New      OT LONG TERM GOAL #5   Title Pt will verbalize understanding of craft-type activies that are appropriate and strategies to participate.    Time 5    Period Weeks    Status New                 Plan - 09/14/20 1323    Clinical Impression Statement Pt verbalized understanding of initial coordination HEP and initial ways  to adapt leisure activities.    OT Occupational Profile and History Detailed Assessment- Review of Records and additional review of physical, cognitive, psychosocial history related to current functional performance    Occupational performance deficits (Please refer to evaluation for details): ADL's;IADL's;Leisure    Body Structure / Function / Physical Skills ADL;Strength;Dexterity;UE functional use;IADL;Sensation;Coordination;FMC    Rehab Potential Fair    Clinical Decision Making Several treatment options, min-mod task modification necessary    Comorbidities Affecting Occupational Performance: May have comorbidities impacting occupational performance    Modification or Assistance to Complete Evaluation  Min-Moderate modification of tasks or  assist with assess necessary to complete eval    OT Frequency 2x / week    OT Duration --   x5 weeks +eval   OT Treatment/Interventions Self-care/ADL training;DME and/or AE instruction;Therapeutic activities;Therapeutic exercise;Neuromuscular education;Patient/family education    Plan continue with compensation strategies for tremors, coordination/simulated leisure and craft activities    Recommended Other Services speech therapy--pt reports speech decline    Consulted and Agree with Plan of Care Patient           Patient will benefit from skilled therapeutic intervention in order to improve the following deficits and impairments:   Body Structure / Function / Physical Skills: ADL,Strength,Dexterity,UE functional use,IADL,Sensation,Coordination,FMC       Visit Diagnosis: Other lack of coordination  Tremor  Muscle weakness (generalized)    Problem List Patient Active Problem List   Diagnosis Date Noted  . Bilateral high frequency sensorineural hearing loss 02/25/2019  . Non compliance with medical treatment 03/23/2018  . History of tobacco use disorder 01/01/2018  . Syncope and collapse 01/01/2018  . Embolic stroke involving left middle  cerebral artery (HCC) s/p IV tPA and mechanical intervention 12/12/2017  . 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 cerebrovascular accident (CVA) ([place]) 12/12/2017  . Acute right hemiparesis ([place]) 12/12/2017  . History of completed stroke 12/12/2017  . Leukocytosis 12/12/2017  . Hypokalemia 12/12/2017  . Hypotension   . 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 Poor compliance with CPAP treatment 03/04/2017  . OSA and COPD overlap syndrome ([place]) 03/04/2017  . Simple chronic bronchitis ([place]) 03/04/2017  . MCI (mild cognitive impairment) 11/27/2016  . Left sided lacunar infarction ([place]) 03/11/2014  . Cerebrovascular small vessel disease 03/11/2014  . Insomnia, idiopathic 12/20/2013  . Snoring 12/06/2013  . Essential hypertension, benign 10/26/2012  . Hyperlipidemia LDL goal <70 10/26/2012  . Polypharmacy 10/26/2012  . DM (diabetes mellitus) ([place]) 10/13/2011  . Tremor 10/13/2011  . GERD (gastroesophageal reflux disease) 10/13/2011  . Depression 10/13/2011    [facility] 09/14/2020, 2:12 PM  [facility] [address] [place], [place], [address] Phone: [phone]   Fax:  [phone]  Name: TASHAWNA THOM MRN: [id number] Date of Birth: May 12, 1961   Vianne Bulls, OTR/L [facility] [address] [place], [place]  [address] [phone] phone [phone] 09/14/20 2:12 PM

## 2020-09-17 ENCOUNTER — Other Ambulatory Visit: Payer: Self-pay | Admitting: Endocrinology

## 2020-09-17 DIAGNOSIS — E1149 Type 2 diabetes mellitus with other diabetic neurological complication: Secondary | ICD-10-CM

## 2020-09-17 DIAGNOSIS — Z794 Long term (current) use of insulin: Secondary | ICD-10-CM

## 2020-09-18 ENCOUNTER — Other Ambulatory Visit: Payer: Self-pay | Admitting: *Deleted

## 2020-09-18 MED ORDER — PRIMIDONE 250 MG PO TABS: ORAL_TABLET | ORAL | 6 refills | Status: DC

## 2020-09-19 ENCOUNTER — Other Ambulatory Visit: Payer: Self-pay

## 2020-09-19 ENCOUNTER — Ambulatory Visit: Payer: Medicare PPO | Admitting: Occupational Therapy

## 2020-09-19 ENCOUNTER — Encounter: Payer: Self-pay | Admitting: Occupational Therapy

## 2020-09-19 DIAGNOSIS — R251 Tremor, unspecified: Secondary | ICD-10-CM | POA: Diagnosis not present

## 2020-09-19 DIAGNOSIS — R278 Other lack of coordination: Secondary | ICD-10-CM

## 2020-09-19 DIAGNOSIS — M6281 Muscle weakness (generalized): Secondary | ICD-10-CM

## 2020-09-19 NOTE — Therapy (Signed)
[facility] [address] [place], [place], [address] Phone: [phone]   Fax:  [phone]  Occupational Therapy Treatment  Patient Details  Name: Brittany [name] MRN: [id number] Date of Birth: 1961/06/29 Referring Provider (OT): Janeece Agee, NP   Encounter Date: 09/19/2020   OT End of Session - 09/19/20 1308    Visit Number 3    Number of Visits 11    Date for OT Re-Evaluation 10/11/20    Authorization Type [employer]--needs Auth    Authorization Time Period cert. date 09/11/20-12/08/20    Authorization - Visit Number 3    Authorization - Number of Visits 10    OT Start Time 1305    OT Stop Time 1355    OT Time Calculation (min) 50 min    Activity Tolerance Patient tolerated treatment well    Behavior During Therapy WFL for tasks assessed/performed           Past Medical History:  Diagnosis Date  . Asthma   . Chronic lower back pain   . Depression   . Family history of adverse reaction to anesthesia    Mother has trouble coming out of anesthesia; patient reports she has no problems  . Fibromyalgia   . High cholesterol   . Presence of permanent cardiac pacemaker   . Seizures (HCC) 11/2017   "day after she came home from hospital after having stroke" (01/01/2018)  . Stroke [facility]) 12/06/2017   "speech issues since" (01/01/2018)  . Type II diabetes mellitus (HCC)     Past Surgical History:  Procedure Laterality Date  . CHOLECYSTECTOMY    . GAS/FLUID EXCHANGE Right 12/12/2019   Procedure: Gas/Fluid Exchange;  Surgeon: Carmela Rima, MD;  Location: [facility] OR;  Service: Ophthalmology;  Laterality: Right;  . INSERT / REPLACE / REMOVE PACEMAKER  01/01/2018  . IR CT HEAD LTD  12/06/2017  . IR PERCUTANEOUS ART THROMBECTOMY/INFUSION INTRACRANIAL INC DIAG ANGIO  12/06/2017  . LOOP RECORDER INSERTION N/A 12/12/2017   Procedure: LOOP RECORDER INSERTION;  Surgeon: Regan Lemming, MD;  [facility]: [facility] INVASIVE CV LAB;   Service: Cardiovascular;  Laterality: N/A;  . LOOP RECORDER REMOVAL  01/01/2018  . LOOP RECORDER REMOVAL N/A 01/01/2018   Procedure: LOOP RECORDER REMOVAL;  Surgeon: Regan Lemming, MD;  Location: [facility] INVASIVE CV LAB;  Service: Cardiovascular;  Laterality: N/A;  . NECK SURGERY    . PACEMAKER IMPLANT N/A 01/01/2018   Procedure: PACEMAKER IMPLANT;  Surgeon: Regan Lemming, MD;  [facility]: [facility] INVAS[facility] CV LAB;  Service: Cardiovascular;  Laterality: N/A;  . PHOTOCOAGULATION Right 12/12/2019   Procedure: Photocoagulation;  Surgeon: Carmela Rima, MD;  Location: [facility] OR;  Service: Ophthalmology;  Laterality: Right;  . RADIOLOGY WITH ANESTHESIA N/A 12/06/2017   Procedure: CODE STROKE;  Surgeon: Julieanne Cotton, MD;  Location: [facility] OR;  Service: Radiology;  Laterality: N/A;  . SCLERAL BUCKLE WITH POSSIBLE 25 GAUGE PARS PLANA VITRECTOMY Right 12/12/2019   Procedure: SCLERAL BUCKLE WITH  PARS PLANA VITRECTOMY RETINAL DETACHMENT REPAIR;  Surgeon: Carmela Rima, MD;  Location: [facility] OR;  Service: Ophthalmology;  Laterality: Right;  . TEE WITHOUT CARDIOVERSION N/A 12/10/2017   Procedure: TRANSESOPHAGEAL ECHOCARDIOGRAM (TEE);  Surgeon: Jake Bathe, MD;  Location: [facility] ENDOSCOPY;  Service: Cardiovascular;  Laterality: N/A;  . THROMBECTOMY FEMORAL ARTERY Right 12/07/2017   Procedure: THROMBECTOMY RIGHT FEMORAL ARTERY;  Surgeon: Maeola Harman, MD;  Location: [facility] OR;  Service: Vascular;  Laterality: Right;  . TONSILLECTOMY  There were no vitals filed for this visit.   Subjective Assessment - 09/19/20 1329    Subjective  Denies pain    Pertinent History Hx of CVA 2019 with residual deficit.PMH:  Asthma, chronic lower back pain, depression, fibromyalgia, high cholesterol, Cardiac PACEMAKER, seizures, hx of CVA (01/01/2018), DM, hx of tremors (?essential tremor), R eye vision with retinal detachment 12/12/19    Patient Stated Goals do more like before my stroke (crafts)    Currently in Pain?  No/denies                  Treatment: tracing activity with v.c for positioning to minimize tremors, followed by writing with pen with foam grip. Standing while weightbearing through table and rocking forwards and backwards, min-mod v.c/ facilitation. Arm bike x 6 mins level 3 for conditioning Simulated eating task, with v.c for management of tremors.              OT Education - 09/19/20 1353    Education Details reveiwed cards and coins activities from coordination HEP, min v.c, shelf liner used for coins    Person(s) Educated Patient    Methods Explanation;Demonstration;Handout;Verbal cues    Comprehension Verbalized understanding;Returned demonstration;Verbal cues required               OT Long Term Goals - 09/11/20 1455      OT LONG TERM GOAL #1   Title Pt will be independent with HEP for coordination.    Time 5    Period Weeks    Status New      OT LONG TERM GOAL #2   Title Pt will be indpendent with R shoulder strengthening HEP (including wt. bearing).    Time 5    Period Weeks    Status New      OT LONG TERM GOAL #3   Title Pt will verbalize understanding of compensation strategies/AE for tremors during ADLs/IADLs (including writing, eating, using computer, and crafts).    Time 5    Period Weeks    Status New      OT LONG TERM GOAL #4   Title Pt will improve R hand coordination for ADLs as shown by improving time on 9-hole peg test by at least 5 sec.    Baseline 52sec.    Time 5    Period Weeks    Status New      OT LONG TERM GOAL #5   Title Pt will verbalize understanding of craft-type activies that are appropriate and strategies to participate.    Time 5    Period Weeks    Status New                 Plan - 09/19/20 1314    Clinical Impression Statement Pt is progressing towards goals. She demonstrates improved ability to trace with right hand supported on tabletop.    OT Occupational Profile and History Detailed  Assessment- Review of Records and additional review of physical, cognitive, psychosocial history related to current functional performance    Occupational performance deficits (Please refer to evaluation for details): ADL's;IADL's;Leisure    Body Structure / Function / Physical Skills ADL;Strength;Dexterity;UE functional use;IADL;Sensation;Coordination;FMC    Rehab Potential Fair    Clinical Decision Making Several treatment options, min-mod task modification necessary    Comorbidities Affecting Occupational Performance: May have comorbidities impacting occupational performance    Modification or Assistance to Complete Evaluation  Min-Moderate modification of tasks or assist with assess necessary to complete eval  OT Frequency 2x / week    OT Duration --   x5 weeks +eval   OT Treatment/Interventions Self-care/ADL training;DME and/or AE instruction;Therapeutic activities;Therapeutic exercise;Neuromuscular education;Patient/family education    Plan continue with compensation strategies for tremors, coordination/simulated leisure and craft activities    Recommended Other Services speech therapy--pt reports speech decline    Consulted and Agree with Plan of Care Patient           Patient will benefit from skilled therapeutic intervention in order to improve the following deficits and impairments:   Body Structure / Function / Physical Skills: ADL,Strength,Dexterity,UE functional use,IADL,Sensation,Coordination,FMC       Visit Diagnosis: Other lack of coordination  Tremor  Muscle weakness (generalized)    Problem List Patient Active Problem List   Diagnosis Date Noted  . Bilateral high frequency sensorineural hearing loss 02/25/2019  . Non compliance with medical treatment 03/23/2018  . History of tobacco use disorder 01/01/2018  . Syncope and collapse 01/01/2018  . 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 Acute right hemiparesis (HCC) 12/12/2017  . History of completed stroke 12/12/2017  . Leukocytosis 12/12/2017  . Hypokalemia 12/12/2017  . Hypotension   . 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 Poor compliance with CPAP treatment 03/04/2017  . OSA and COPD overlap syndrome (HCC) 03/04/2017  . Simple chronic bronchitis (HCC) 03/04/2017  . MCI (mild cognitive impairment) 11/27/2016  . Left sided lacunar infarction (HCC) 03/11/2014  . Cerebrovascular small vessel disease 03/11/2014  . Insomnia, idiopathic 12/20/2013  . Snoring 12/06/2013  . Essential hypertension, benign 10/26/2012  . Hyperlipidemia LDL goal <70 10/26/2012  . Polypharmacy 10/26/2012  . DM (diabetes mellitus) (HCC) 10/13/2011  . Tremor 10/13/2011  . GERD (gastroesophageal reflux disease) 10/13/2011  . Depression 10/13/2011    Clydette Privitera 09/19/2020, 4:08 PM  [facility] [address] [place], [place], [address] Phone: [phone]   Fax:  [phone]  Name: Brittany [name] MRN: [id number] Date of Birth: 03/31/1961

## 2020-09-21 ENCOUNTER — Ambulatory Visit: Payer: Medicare PPO | Admitting: Occupational Therapy

## 2020-09-21 ENCOUNTER — Other Ambulatory Visit: Payer: Self-pay

## 2020-09-21 DIAGNOSIS — R278 Other lack of coordination: Secondary | ICD-10-CM | POA: Diagnosis not present

## 2020-09-21 DIAGNOSIS — M6281 Muscle weakness (generalized): Secondary | ICD-10-CM

## 2020-09-21 DIAGNOSIS — R251 Tremor, unspecified: Secondary | ICD-10-CM | POA: Diagnosis not present

## 2020-09-21 NOTE — Patient Instructions (Signed)
Shoulder Push-Up (Prone on Elbows)    With elbows placed under shoulders, rise up on elbows as high as possible. Keep hips on surface and back arched. Hold __5__ seconds. Repeat 10____ times. Do __2__ sessions per day.  Copyright  VHI. All rights reserved.

## 2020-09-21 NOTE — Therapy (Signed)
[facility] [address] [place], [place], [address] Phone: [phone]   Fax:  [phone]  Occupational Therapy Treatment  Patient Details  Name: KARIN PINEDO MRN: [id number] Date of Birth: 25-Jan-1961 Referring Provider (OT): Janeece Agee, NP   Encounter Date: 09/21/2020   OT End of Session - 09/21/20 1646    Visit Number 4    Number of Visits 11    Date for OT Re-Evaluation 10/11/20    Authorization Type [employer]--needs Auth    Authorization Time Period cert. date 09/11/20-12/08/20    Authorization - Visit Number 4    Authorization - Number of Visits 10    OT Start Time 1405    OT Stop Time 1445    OT Time Calculation (min) 40 min    Activity Tolerance Patient tolerated treatment well    Behavior During Therapy WFL for tasks assessed/performed           Past Medical History:  Diagnosis Date  . Asthma   . Chronic lower back pain   . Depression   . Family history of adverse reaction to anesthesia    Mother has trouble coming out of anesthesia; patient reports she has no problems  . Fibromyalgia   . High cholesterol   . Presence of permanent cardiac pacemaker   . Seizures (HCC) 11/2017   "day after she came home from hospital after having stroke" (01/01/2018)  . Stroke [facility]) 12/06/2017   "speech issues since" (01/01/2018)  . Type II diabetes mellitus (HCC)     Past Surgical History:  Procedure Laterality Date  . CHOLECYSTECTOMY    . GAS/FLUID EXCHANGE Right 12/12/2019   Procedure: Gas/Fluid Exchange;  Surgeon: Carmela Rima, MD;  Location: [facility] OR;  Service: Ophthalmology;  Laterality: Right;  . INSERT / REPLACE / REMOVE PACEMAKER  01/01/2018  . IR CT HEAD LTD  12/06/2017  . IR PERCUTANEOUS ART THROMBECTOMY/INFUSION INTRACRANIAL INC DIAG ANGIO  12/06/2017  . LOOP RECORDER INSERTION N/A 12/12/2017   Procedure: LOOP RECORDER INSERTION;  Surgeon: Regan Lemming, MD;  [facility] LAB;   Service: Cardiovascular;  Laterality: N/A;  . LOOP RECORDER REMOVAL  01/01/2018  . LOOP RECORDER REMOVAL N/A 01/01/2018   Procedure: LOOP RECORDER REMOVAL;  Surgeon: Regan Lemming, MD;  [facility]: [facility] LAB;  Service: Cardiovascular;  Laterality: N/A;  . NECK SURGERY    . PACEMAKER IMPLANT N/A 01/01/2018   Procedure: PACEMAKER IMPLANT;  Surgeon: Regan Lemming, MD;  [facility];  Service: Cardiovascular;  Laterality: N/A;  . PHOTOCOAGULATION Right 12/12/2019   Procedure: Photocoagulation;  Surgeon: Carmela Rima, MD;  Location: [facility] OR;  Service: Ophthalmology;  Laterality: Right;  . RADIOLOGY WITH ANESTHESIA N/A 12/06/2017   Procedure: CODE STROKE;  Surgeon: Julieanne Cotton, MD;  Location: [facility] OR;  Service: Radiology;  Laterality: N/A;  . SCLERAL BUCKLE WITH POSSIBLE 25 GAUGE PARS PLANA VITRECTOMY Right 12/12/2019   Procedure: SCLERAL BUCKLE WITH  PARS PLANA VITRECTOMY RETINAL DETACHMENT REPAIR;  Surgeon: Carmela Rima, MD;  Location: [facility] OR;  Service: Ophthalmology;  Laterality: Right;  . TEE WITHOUT CARDIOVERSION N/A 12/10/2017   Procedure: TRANSESOPHAGEAL ECHOCARDIOGRAM (TEE);  Surgeon: Jake Bathe, MD;  Location: [facility] ENDOSCOPY;  Service: Cardiovascular;  Laterality: N/A;  . THROMBECTOMY FEMORAL ARTERY Right 12/07/2017   Procedure: THROMBECTOMY RIGHT FEMORAL ARTERY;  Surgeon: Maeola Harman, MD;  Location: [facility] OR;  Service: Vascular;  Laterality: Right;  . TONSILLECTOMY  There were no vitals filed for this visit.   Subjective Assessment - 09/21/20 1645    Subjective  Denies pain    Pertinent History Hx of CVA 2019 with residual deficit.PMH:  Asthma, chronic lower back pain, depression, fibromyalgia, high cholesterol, Cardiac PACEMAKER, seizures, hx of CVA (01/01/2018), DM, hx of tremors (?essential tremor), R eye vision with retinal detachment 12/12/19    Patient Stated Goals do more like before my stroke (crafts)    Currently in Pain?  No/denies                     Treatment: exercses in prone on elbows  lifting chest then reaching with alternate UE's, min v.c Sidelying to perform UE twist(cam shell) min-mod v.c Then supine closed chain shoulder flexion, min v.c / facilitation. UBE x 9 mins level 3 for conditioningStanding at counter rocking forwards and backwards for weightbearing min v.c for correct positioning Placing grooved pegs into pegboard with RUE, mod difficulty and min v.c for positioning.            OT Education - 09/21/20 1436    Education Details HEP prone on elbows, lifting chest the reach with alternate UE's, then clamshell sidelying    Person(s) Educated Patient    Methods Explanation;Demonstration;Handout;Verbal cues    Comprehension Verbalized understanding;Returned demonstration;Verbal cues required               OT Long Term Goals - 09/11/20 1455      OT LONG TERM GOAL #1   Title Pt will be independent with HEP for coordination.    Time 5    Period Weeks    Status New      OT LONG TERM GOAL #2   Title Pt will be indpendent with R shoulder strengthening HEP (including wt. bearing).    Time 5    Period Weeks    Status New      OT LONG TERM GOAL #3   Title Pt will verbalize understanding of compensation strategies/AE for tremors during ADLs/IADLs (including writing, eating, using computer, and crafts).    Time 5    Period Weeks    Status New      OT LONG TERM GOAL #4   Title Pt will improve R hand coordination for ADLs as shown by improving time on 9-hole peg test by at least 5 sec.    Baseline 52sec.    Time 5    Period Weeks    Status New      OT LONG TERM GOAL #5   Title Pt will verbalize understanding of craft-type activies that are appropriate and strategies to participate.    Time 5    Period Weeks    Status New                 Plan - 09/21/20 1646    Clinical Impression Statement Pt is progressing towards goals. She was able to write  better at home after exercising on Tuesday.    OT Occupational Profile and History Detailed Assessment- Review of Records and additional review of physical, cognitive, psychosocial history related to current functional performance    Occupational performance deficits (Please refer to evaluation for details): ADL's;IADL's;Leisure    Body Structure / Function / Physical Skills ADL;Strength;Dexterity;UE functional use;IADL;Sensation;Coordination;FMC    Rehab Potential Fair    Clinical Decision Making Several treatment options, min-mod task modification necessary    Comorbidities Affecting Occupational Performance: May have comorbidities impacting occupational performance  Modification or Assistance to Complete Evaluation  Min-Moderate modification of tasks or assist with assess necessary to complete eval    OT Frequency 2x / week    OT Treatment/Interventions Self-care/ADL training;DME and/or AE instruction;Therapeutic activities;Therapeutic exercise;Neuromuscular education;Patient/family education    Plan reveiw HEP, continue to work towards goals    Consulted and Agree with Plan of Care Patient           Patient will benefit from skilled therapeutic intervention in order to improve the following deficits and impairments:   Body Structure / Function / Physical Skills: ADL,Strength,Dexterity,UE functional use,IADL,Sensation,Coordination,FMC       Visit Diagnosis: Other lack of coordination  Tremor  Muscle weakness (generalized)    Problem List Patient Active Problem List   Diagnosis Date Noted  . Bilateral high frequency sensorineural hearing loss 02/25/2019  . Non compliance with medical treatment 03/23/2018  . History of tobacco use disorder 01/01/2018  . Syncope and collapse 01/01/2018  . 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  Acute right hemiparesis (HCC) 12/12/2017  . History of completed stroke 12/12/2017  . Leukocytosis 12/12/2017  . Hypokalemia 12/12/2017  . Hypotension   . 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 Poor compliance with CPAP treatment 03/04/2017  . OSA and COPD overlap syndrome (HCC) 03/04/2017  . Simple chronic bronchitis (HCC) 03/04/2017  . MCI (mild cognitive impairment) 11/27/2016  . Left sided lacunar infarction (HCC) 03/11/2014  . Cerebrovascular small vessel disease 03/11/2014  . Insomnia, idiopathic 12/20/2013  . Snoring 12/06/2013  . Essential hypertension, benign 10/26/2012  . Hyperlipidemia LDL goal <70 10/26/2012  . Polypharmacy 10/26/2012  . DM (diabetes mellitus) (HCC) 10/13/2011  . Tremor 10/13/2011  . GERD (gastroesophageal reflux disease) 10/13/2011  . Depression 10/13/2011    Tiegan Terpstra 09/21/2020, 4:53 PM Keene Breath, OTR/L [phone] Phone: [phone] 4:53 PM 09/21/20 [facility] [address] [place], [place], [address] Phone: [phone]   Fax:  [phone]  Name: HALLEE MCKENNY MRN: [id number] Date of Birth: 1961-09-03

## 2020-09-25 ENCOUNTER — Ambulatory Visit (INDEPENDENT_AMBULATORY_CARE_PROVIDER_SITE_OTHER): Payer: Medicare PPO | Admitting: Psychologist

## 2020-09-25 DIAGNOSIS — F321 Major depressive disorder, single episode, moderate: Secondary | ICD-10-CM

## 2020-09-26 ENCOUNTER — Ambulatory Visit: Payer: Medicare PPO | Admitting: Occupational Therapy

## 2020-09-27 ENCOUNTER — Ambulatory Visit: Payer: BC Managed Care – PPO | Admitting: Adult Health

## 2020-09-28 ENCOUNTER — Encounter: Payer: Self-pay | Admitting: Occupational Therapy

## 2020-09-28 ENCOUNTER — Ambulatory Visit: Payer: Medicare PPO | Admitting: Occupational Therapy

## 2020-09-28 ENCOUNTER — Other Ambulatory Visit: Payer: Self-pay

## 2020-09-28 DIAGNOSIS — R251 Tremor, unspecified: Secondary | ICD-10-CM | POA: Diagnosis not present

## 2020-09-28 DIAGNOSIS — M6281 Muscle weakness (generalized): Secondary | ICD-10-CM | POA: Diagnosis not present

## 2020-09-28 DIAGNOSIS — R278 Other lack of coordination: Secondary | ICD-10-CM

## 2020-09-28 NOTE — Therapy (Signed)
[facility] [address] [place], [place], [address] Phone: [phone]   Fax:  [phone]  Occupational Therapy Treatment  Patient Details  Name: Brittany [name] MRN: [id number] Date of Birth: [birth year] Referring Provider (OT): Janeece Agee, NP   Encounter Date: 09/28/2020   OT End of Session - 09/28/20 1537    Visit Number 5    Number of Visits 11    Date for OT Re-Evaluation 10/11/20    Authorization Type [employer]--needs Auth    Authorization Time Period cert. date 09/11/20-12/08/20    Authorization - Visit Number 5    Authorization - Number of Visits 10    Progress Note Due on Visit 10    OT Start Time 1445    OT Stop Time 1530    OT Time Calculation (min) 45 min    Activity Tolerance Patient tolerated treatment well    Behavior During Therapy [facility] for tasks assessed/performed           Past Medical History:  Diagnosis Date  . Asthma   . Chronic lower back pain   . Depression   . Family history of adverse reaction to anesthesia    Mother has trouble coming out of anesthesia; patient reports she has no problems  . Fibromyalgia   . High cholesterol   . Presence of permanent cardiac pacemaker   . Seizures (HCC) 11/2017   "day after she came home from hospital after having stroke" (01/01/2018)  . Stroke [facility]) 12/06/2017   "speech issues since" (01/01/2018)  . Type II diabetes mellitus (HCC)     Past Surgical History:  Procedure Laterality Date  . CHOLECYSTECTOMY    . GAS/FLUID EXCHANGE Right 12/12/2019   Procedure: Gas/Fluid Exchange;  Surgeon: Carmela Rima, MD;  Location: [place] OR;  Service: Ophthalmology;  Laterality: Right;  . INSERT / REPLACE / REMOVE PACEMAKER  01/01/2018  . IR CT HEAD LTD  12/06/2017  . IR PERCUTANEOUS ART THROMBECTOMY/INFUSION INTRACRANIAL INC DIAG ANGIO  12/06/2017  . LOOP RECORDER INSERTION N/A 12/12/2017   Procedure: LOOP RECORDER INSERTION;  Surgeon: Regan Lemming,  MD;  [facility]: [facility] INVASIVE CV LAB;  Service: Cardiovascular;  Laterality: N/A;  . LOOP RECORDER REMOVAL  01/01/2018  . LOOP RECORDER REMOVAL N/A 01/01/2018   Procedure: LOOP RECORDER REMOVAL;  Surgeon: Regan Lemming, MD;  Location: [facility] INVASIVE CV LAB;  Service: Cardiovascular;  Laterality: N/A;  . NECK SURGERY    . PACEMAKER IMPLANT N/A 01/01/2018   Procedure: PACEMAKER IMPLANT;  Surgeon: Regan Lemming, MD;  [facility] INV[facility] CV LAB;  Service: Cardiovascular;  Laterality: N/A;  . PHOTOCOAGULATION Right 12/12/2019   Procedure: Photocoagulation;  Surgeon: Carmela Rima, MD;  Location: [facility] OR;  Service: Ophthalmology;  Laterality: Right;  . RADIOLOGY WITH ANESTHESIA N/A 12/06/2017   Procedure: CODE STROKE;  Surgeon: Julieanne Cotton, MD;  Location: [facility] OR;  Service: Radiology;  Laterality: N/A;  . SCLERAL BUCKLE WITH POSSIBLE 25 GAUGE PARS PLANA VITRECTOMY Right 12/12/2019   Procedure: SCLERAL BUCKLE WITH  PARS PLANA VITRECTOMY RETINAL DETACHMENT REPAIR;  Surgeon: Carmela Rima, MD;  Location: [facility] OR;  Service: Ophthalmology;  Laterality: Right;  . TEE WITHOUT CARDIOVERSION N/A 12/10/2017   Procedure: TRANSESOPHAGEAL ECHOCARDIOGRAM (TEE);  Surgeon: Jake Bathe, MD;  Location: [facility] ENDOSCOPY;  Service: Cardiovascular;  Laterality: N/A;  . THROMBECTOMY FEMORAL ARTERY Right 12/07/2017   Procedure: THROMBECTOMY RIGHT FEMORAL ARTERY;  Surgeon: Maeola Harman, MD;  Location: [facility] OR;  Service: Vascular;  Laterality: Right;  . TONSILLECTOMY      There were no vitals filed for this visit.   Subjective Assessment - 09/28/20 1446    Subjective  It does not seem to be shaking as much    Pertinent History Hx of CVA 2019 with residual deficit.PMH:  Asthma, chronic lower back pain, depression, fibromyalgia, high cholesterol, Cardiac PACEMAKER, seizures, hx of CVA (01/01/2018), DM, hx of tremors (?essential tremor), R eye vision with retinal detachment 12/12/19    Patient Stated Goals do  more like before my stroke (crafts)    Currently in Pain? No/denies    Multiple Pain Sites No                        OT Treatments/Exercises (OP) - 09/28/20 0001      ADLs   Toileting Patient indicates that she is having some trouble wiping herself with right hand after BM.  Worked on componenet movements - shoulder extension with internal rotation with 2.2 lb weighted ball - passing behind back, between legs.  Patient appears to have adequate shoulder and elbow range and strength for this task, may consider if functional wrist/hand strength is an issue.      Neurological Re-education Exercises   Other Exercises 1 Worked on coordination exercises - RUE, and BUE.  Chose activities that would especially challenge fine motor coord.  Stacking dominoes, jenga like game.  Patient used surfaces to steady BUE's to allow optimal hand position/steadiness.  Ended with UE Bike x 8 min - 4 min forward, and 4 min backward at level 3.                       OT Long Term Goals - 09/28/20 1538      OT LONG TERM GOAL #1   Title Pt will be independent with HEP for coordination.    Time 5    Period Weeks    Status On-going      OT LONG TERM GOAL #2   Title Pt will be indpendent with R shoulder strengthening HEP (including wt. bearing).    Time 5    Period Weeks    Status On-going      OT LONG TERM GOAL #3   Title Pt will verbalize understanding of compensation strategies/AE for tremors during ADLs/IADLs (including writing, eating, using computer, and crafts).    Time 5    Period Weeks    Status On-going      OT LONG TERM GOAL #4   Title Pt will improve R hand coordination for ADLs as shown by improving time on 9-hole peg test by at least 5 sec.    Baseline 52sec.    Time 5    Period Weeks    Status On-going      OT LONG TERM GOAL #5   Title Pt will verbalize understanding of craft-type activies that are appropriate and strategies to participate.    Time 5    Period  Weeks    Status On-going                 Plan - 09/28/20 1537    Clinical Impression Statement Patient reports improvement in hand coordination    OT Occupational Profile and History Detailed Assessment- Review of Records and additional review of physical, cognitive, psychosocial history related to current functional performance    Occupational performance deficits (Please refer to evaluation for details): ADL's;IADL's;Leisure    Body  Structure / Function / Physical Skills ADL;Strength;Dexterity;UE functional use;IADL;Sensation;Coordination;FMC    Rehab Potential Fair    Clinical Decision Making Several treatment options, min-mod task modification necessary    Comorbidities Affecting Occupational Performance: May have comorbidities impacting occupational performance    Modification or Assistance to Complete Evaluation  Min-Moderate modification of tasks or assist with assess necessary to complete eval    OT Frequency 2x / week    OT Treatment/Interventions Self-care/ADL training;DME and/or AE instruction;Therapeutic activities;Therapeutic exercise;Neuromuscular education;Patient/family education    Plan reveiw HEP, continue to work towards goals    Recommended Other Services speech therapy--pt reports speech decline    Consulted and Agree with Plan of Care Patient           Patient will benefit from skilled therapeutic intervention in order to improve the following deficits and impairments:   Body Structure / Function / Physical Skills: ADL,Strength,Dexterity,UE functional use,IADL,Sensation,Coordination,FMC       Visit Diagnosis: Other lack of coordination  Tremor  Muscle weakness (generalized)    Problem List Patient Active Problem List   Diagnosis Date Noted  . Bilateral high frequency sensorineural hearing loss 02/25/2019  . Non compliance with medical treatment 03/23/2018  . History of tobacco use disorder 01/01/2018  . Syncope and collapse 01/01/2018  .  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 Acute right hemiparesis (HCC) 12/12/2017  . History of completed stroke 12/12/2017  . Leukocytosis 12/12/2017  . Hypokalemia 12/12/2017  . Hypotension   . 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 Poor compliance with CPAP treatment 03/04/2017  . OSA and COPD overlap syndrome (HCC) 03/04/2017  . Simple chronic bronchitis (HCC) 03/04/2017  . MCI (mild cognitive impairment) 11/27/2016  . Left sided lacunar infarction (HCC) 03/11/2014  . Cerebrovascular small vessel disease 03/11/2014  . Insomnia, idiopathic 12/20/2013  . Snoring 12/06/2013  . Essential hypertension, benign 10/26/2012  . Hyperlipidemia LDL goal <70 10/26/2012  . Polypharmacy 10/26/2012  . DM (diabetes mellitus) (HCC) 10/13/2011  . Tremor 10/13/2011  . GERD (gastroesophageal reflux disease) 10/13/2011  . Depression 10/13/2011    Collier Salina, OTR/L 09/28/2020, 3:40 PM  [facility] [address] [place], [place], [address] Phone: [phone]   Fax:  [phone]  Name: Brittany [name] MRN: [id number] Date of Birth: 02-15-1961

## 2020-10-02 ENCOUNTER — Other Ambulatory Visit: Payer: Self-pay

## 2020-10-02 DIAGNOSIS — Z961 Presence of intraocular lens: Secondary | ICD-10-CM | POA: Diagnosis not present

## 2020-10-02 DIAGNOSIS — Z9889 Other specified postprocedural states: Secondary | ICD-10-CM | POA: Diagnosis not present

## 2020-10-02 DIAGNOSIS — E119 Type 2 diabetes mellitus without complications: Secondary | ICD-10-CM | POA: Diagnosis not present

## 2020-10-02 DIAGNOSIS — H25812 Combined forms of age-related cataract, left eye: Secondary | ICD-10-CM | POA: Diagnosis not present

## 2020-10-02 DIAGNOSIS — H26491 Other secondary cataract, right eye: Secondary | ICD-10-CM | POA: Diagnosis not present

## 2020-10-03 ENCOUNTER — Ambulatory Visit (INDEPENDENT_AMBULATORY_CARE_PROVIDER_SITE_OTHER): Payer: Medicare PPO

## 2020-10-03 ENCOUNTER — Ambulatory Visit: Payer: Medicare PPO | Admitting: Occupational Therapy

## 2020-10-03 DIAGNOSIS — R251 Tremor, unspecified: Secondary | ICD-10-CM

## 2020-10-03 DIAGNOSIS — I442 Atrioventricular block, complete: Secondary | ICD-10-CM | POA: Diagnosis not present

## 2020-10-03 DIAGNOSIS — R278 Other lack of coordination: Secondary | ICD-10-CM | POA: Diagnosis not present

## 2020-10-03 DIAGNOSIS — M6281 Muscle weakness (generalized): Secondary | ICD-10-CM | POA: Diagnosis not present

## 2020-10-03 LAB — CUP PACEART REMOTE DEVICE CHECK
Battery Remaining Longevity: 150 mo
Battery Voltage: 3.03 V
Brady Statistic AP VP Percent: 0.34 %
Brady Statistic AP VS Percent: 0.27 %
Brady Statistic AS VP Percent: 1.44 %
Brady Statistic AS VS Percent: 97.94 %
Brady Statistic RA Percent Paced: 0.62 %
Brady Statistic RV Percent Paced: 1.79 %
Date Time Interrogation Session: 20220124234425
Implantable Lead Implant Date: 20190425
Implantable Lead Implant Date: 20190425
Implantable Lead Location: 753859
Implantable Lead Location: 753860
Implantable Lead Model: 5076
Implantable Lead Model: 5076
Implantable Pulse Generator Implant Date: 20190425
Lead Channel Impedance Value: 342 Ohm
Lead Channel Impedance Value: 361 Ohm
Lead Channel Impedance Value: 399 Ohm
Lead Channel Impedance Value: 532 Ohm
Lead Channel Pacing Threshold Amplitude: 0.625 V
Lead Channel Pacing Threshold Amplitude: 1 V
Lead Channel Pacing Threshold Pulse Width: 0.4 ms
Lead Channel Pacing Threshold Pulse Width: 0.4 ms
Lead Channel Sensing Intrinsic Amplitude: 13.625 mV
Lead Channel Sensing Intrinsic Amplitude: 13.625 mV
Lead Channel Sensing Intrinsic Amplitude: 3.125 mV
Lead Channel Sensing Intrinsic Amplitude: 3.125 mV
Lead Channel Setting Pacing Amplitude: 2 V
Lead Channel Setting Pacing Amplitude: 2.5 V
Lead Channel Setting Pacing Pulse Width: 0.4 ms
Lead Channel Setting Sensing Sensitivity: 2 mV

## 2020-10-03 NOTE — Therapy (Signed)
[facility] [address] [place], [place], [address] Phone: [phone]   Fax:  [phone]  Occupational Therapy Treatment  Patient Details  Name: Brittany [name] MRN: [id number] Date of Birth: [birth year] Referring Provider (OT): Maximiano Coss, NP   Encounter Date: 10/03/2020   OT End of Session - 10/03/20 1310    Visit Number 6    Number of Visits 11    Date for OT Re-Evaluation 10/11/20    Authorization Type [employer] Medicare--needs Auth    Authorization Time Period cert. date 09/11/20-12/08/20 week 4/5 - 11/03/20    Authorization - Visit Number 6    Authorization - Number of Visits 10    Progress Note Due on Visit 10    OT Start Time 3545    OT Stop Time 1350    OT Time Calculation (min) 44 min    Activity Tolerance Patient tolerated treatment well    Behavior During Therapy [facility] for tasks assessed/performed           Past Medical History:  Diagnosis Date  . Asthma   . Chronic lower back pain   . Depression   . Family history of adverse reaction to anesthesia    Mother has trouble coming out of anesthesia; patient reports she has no problems  . Fibromyalgia   . High cholesterol   . Presence of permanent cardiac pacemaker   . Seizures ([place]) 11/2017   "day after she came home from hospital after having stroke" (01/01/2018)  . Stroke [facility]) 12/06/2017   "speech issues since" (01/01/2018)  . Type II diabetes mellitus ([place])     Past Surgical History:  Procedure Laterality Date  . CHOLECYSTECTOMY    . GAS/FLUID EXCHANGE Right 12/12/2019   Procedure: Gas/Fluid Exchange;  Surgeon: Jalene Mullet, MD;  Location: [place];  Service: Ophthalmology;  Laterality: Right;  . INSERT / REPLACE / REMOVE PACEMAKER  01/01/2018  . IR CT HEAD LTD  12/06/2017  . IR PERCUTANEOUS ART THROMBECTOMY/INFUSION INTRACRANIAL INC DIAG ANGIO  12/06/2017  . LOOP RECORDER INSERTION N/A 12/12/2017   Procedure: LOOP RECORDER INSERTION;  Surgeon:  Constance Haw, MD;  Location: [place] CV LAB;  Service: Cardiovascular;  Laterality: N/A;  . LOOP RECORDER REMOVAL  01/01/2018  . LOOP RECORDER REMOVAL N/A 01/01/2018   Procedure: LOOP RECORDER REMOVAL;  Surgeon: Constance Haw, MD;  Location: [place] CV LAB;  Service: Cardiovascular;  Laterality: N/A;  . NECK SURGERY    . PACEMAKER IMPLANT N/A 01/01/2018   Procedure: PACEMAKER IMPLANT;  Surgeon: Constance Haw, MD;  Location: [place] CV LAB;  Service: Cardiovascular;  Laterality: N/A;  . PHOTOCOAGULATION Right 12/12/2019   Procedure: Photocoagulation;  Surgeon: Jalene Mullet, MD;  Location: [place];  Service: Ophthalmology;  Laterality: Right;  . RADIOLOGY WITH ANESTHESIA N/A 12/06/2017   Procedure: CODE STROKE;  Surgeon: Luanne Bras, MD;  Location: [place];  Service: Radiology;  Laterality: N/A;  . SCLERAL BUCKLE WITH POSSIBLE 25 GAUGE PARS PLANA VITRECTOMY Right 12/12/2019   Procedure: SCLERAL BUCKLE WITH  PARS PLANA VITRECTOMY RETINAL DETACHMENT REPAIR;  Surgeon: Jalene Mullet, MD;  Location: [place];  Service: Ophthalmology;  Laterality: Right;  . TEE WITHOUT CARDIOVERSION N/A 12/10/2017   Procedure: TRANSESOPHAGEAL ECHOCARDIOGRAM (TEE);  Surgeon: Jerline Pain, MD;  Location: [facility] ENDOSCOPY;  Service: Cardiovascular;  Laterality: N/A;  . THROMBECTOMY FEMORAL ARTERY Right 12/07/2017   Procedure: THROMBECTOMY RIGHT FEMORAL ARTERY;  Surgeon: Waynetta Sandy, MD;  Location: [place];  Service: Vascular;  Laterality: Right;  . TONSILLECTOMY      There were no vitals filed for this visit.   Subjective Assessment - 10/03/20 1306    Subjective  Pt reports her tremors are really bad  today    Pertinent History Hx of CVA 2019 with residual deficit.PMH:  Asthma, chronic lower back pain, depression, fibromyalgia, high cholesterol, Cardiac PACEMAKER, seizures, hx of CVA (01/01/2018), DM, hx of tremors (?essential tremor), R eye vision with retinal detachment 12/12/19     Patient Stated Goals do more like before my stroke (crafts)    Currently in Pain? No/denies                Treatment: copying small peg design for increased fine motor coordination  with right hand min difficulty Arm bike x 6 mins level 4 for conditioning Handwriting activity with large foam grip, pt wrote 3 sentences with good legibility and letter size. Supine closed chain shoulder flexion and chest press, min v.c                 OT Education - 10/03/20 1334    Education Details reviewed HEP prone on elbows, lifting chest then reach with alternate UE's, then clamshell sidelying, and standing at sink rocking forwards and backwards    Person(s) Educated Patient    Methods Explanation;Demonstration;Verbal cues    Comprehension Verbalized understanding;Returned demonstration;Verbal cues required               OT Long Term Goals - 10/03/20 1346      OT LONG TERM GOAL #1   Title Pt will be independent with HEP for coordination.    Time 5    Period Weeks    Status Achieved      OT LONG TERM GOAL #2   Title Pt will be indpendent with R shoulder strengthening HEP (including wt. bearing).    Time 5    Period Weeks    Status On-going   met for prone exercises and modified quadraped rock     OT LONG TERM GOAL #3   Title Pt will verbalize understanding of compensation strategies/AE for tremors during ADLs/IADLs (including writing, eating, using computer, and crafts).    Time 5    Period Weeks    Status On-going      OT LONG TERM GOAL #4   Title Pt will improve R hand coordination for ADLs as shown by improving time on 9-hole peg test by at least 5 sec.    Baseline 52sec.    Time 5    Period Weeks    Status On-going      OT LONG TERM GOAL #5   Title Pt will verbalize understanding of craft-type activies that are appropriate and strategies to participate.    Time 5    Period Weeks    Status On-going                 Plan - 10/03/20 1341     Clinical Impression Statement Pt is progressing towards goals. She demonstrates understanding of prone HEP.    OT Occupational Profile and History Detailed Assessment- Review of Records and additional review of physical, cognitive, psychosocial history related to current functional performance    Occupational performance deficits (Please refer to evaluation for details): ADL's;IADL's;Leisure    Body Structure / Function / Physical Skills ADL;Strength;Dexterity;UE functional use;IADL;Sensation;Coordination;FMC    Rehab Potential Fair    Clinical Decision Making Several treatment options, min-mod task modification necessary  Comorbidities Affecting Occupational Performance: May have comorbidities impacting occupational performance    Modification or Assistance to Complete Evaluation  Min-Moderate modification of tasks or assist with assess necessary to complete eval    OT Frequency 2x / week    OT Duration --   5 weeks   OT Treatment/Interventions Self-care/ADL training;DME and/or AE instruction;Therapeutic activities;Therapeutic exercise;Neuromuscular education;Patient/family education    Plan continue to work towards goals, next week is week 5, discuss plans to renew vs d/c, consider low range theraband? computer skills    Recommended Other Services speech therapy--pt reports speech decline    Consulted and Agree with Plan of Care Patient           Patient will benefit from skilled therapeutic intervention in order to improve the following deficits and impairments:   Body Structure / Function / Physical Skills: ADL,Strength,Dexterity,UE functional use,IADL,Sensation,Coordination,FMC       Visit Diagnosis: Other lack of coordination  Tremor  Muscle weakness (generalized)    Problem List Patient Active Problem List   Diagnosis Date Noted  . Bilateral high frequency sensorineural hearing loss 02/25/2019  . Non compliance with medical treatment 03/23/2018  . History of tobacco  use disorder 01/01/2018  . Syncope and collapse 01/01/2018  . 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 cerebrovascular accident (CVA) ([place]) 12/12/2017  . Acute right hemiparesis ([place]) 12/12/2017  . History of completed stroke 12/12/2017  . Leukocytosis 12/12/2017  . Hypokalemia 12/12/2017  . Hypotension   . 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 Poor compliance with CPAP treatment 03/04/2017  . OSA and COPD overlap syndrome ([place]) 03/04/2017  . Simple chronic bronchitis ([place]) 03/04/2017  . MCI (mild cognitive impairment) 11/27/2016  . Left sided lacunar infarction ([place]) 03/11/2014  . Cerebrovascular small vessel disease 03/11/2014  . Insomnia, idiopathic 12/20/2013  . Snoring 12/06/2013  . Essential hypertension, benign 10/26/2012  . Hyperlipidemia LDL goal <70 10/26/2012  . Polypharmacy 10/26/2012  . DM (diabetes mellitus) ([place]) 10/13/2011  . Tremor 10/13/2011  . GERD (gastroesophageal reflux disease) 10/13/2011  . Depression 10/13/2011    Kristofer Schaffert 10/03/2020, 1:48 PM  [facility] [address] [place], [place], [address] Phone: [phone]   Fax:  [phone]  Name: LAURALYN SHADOWENS MRN: [id number] Date of Birth: [birth year]

## 2020-10-04 ENCOUNTER — Other Ambulatory Visit: Payer: Self-pay

## 2020-10-04 ENCOUNTER — Ambulatory Visit: Payer: Medicare PPO | Admitting: Endocrinology

## 2020-10-04 ENCOUNTER — Encounter: Payer: Self-pay | Admitting: Endocrinology

## 2020-10-04 VITALS — BP 132/80 | HR 90 | Ht 63.5 in | Wt 181.0 lb

## 2020-10-04 DIAGNOSIS — Z794 Long term (current) use of insulin: Secondary | ICD-10-CM

## 2020-10-04 DIAGNOSIS — E1149 Type 2 diabetes mellitus with other diabetic neurological complication: Secondary | ICD-10-CM | POA: Diagnosis not present

## 2020-10-04 LAB — POCT GLYCOSYLATED HEMOGLOBIN (HGB A1C): Hemoglobin A1C: 7.2 % — AB (ref 4.0–5.6)

## 2020-10-04 NOTE — Progress Notes (Signed)
Subjective:    Patient ID: Brittany [name], female    DOB: 29-Jul-1961, 60 y.o.   MRN: [id number]  HPI Pt returns for f/u of diabetes mellitus: DM type: Insulin-requiring type 2 Dx'ed: 0630 Complications: CVA Therapy: insulin since 2019 GDM: 1987 DKA: never Severe hypoglycemia: never Pancreatitis: never Pancreatic imaging: normal on 2019 CT.   [place]: husband gives pt her insulin (due to CVA--he says he knows how to use syringe and vial).   Other: she gets multiple daily injections.   Interval history: she brings her meter with her cbg's which I have reviewed today.  cbg varies from 79-212.  She never misses the insulin.  pt states she feels well in general.  She declines to add another med.   Past Medical History:  Diagnosis Date  . Asthma   . Cataract    Phreesia 10/03/2020  . Chronic lower back pain   . Depression   . Depression    Phreesia 10/03/2020  . Diabetes mellitus without complication ([place])    Phreesia 10/03/2020  . Family history of adverse reaction to anesthesia    Mother has trouble coming out of anesthesia; patient reports she has no problems  . Fibromyalgia   . High cholesterol   . Hypertension    Phreesia 10/03/2020  . Presence of permanent cardiac pacemaker   . Seizures ([place]) 11/2017   "day after she came home from hospital after having stroke" (01/01/2018)  . Stroke [facility]) 12/06/2017   "speech issues since" (01/01/2018)  . Type II diabetes mellitus ([place])     Past Surgical History:  Procedure Laterality Date  . CHOLECYSTECTOMY    . EYE SURGERY N/A    Phreesia 10/03/2020  . GAS/FLUID EXCHANGE Right 12/12/2019   Procedure: Gas/Fluid Exchange;  Surgeon: Jalene Mullet, MD;  Location: [place];  Service: Ophthalmology;  Laterality: Right;  . INSERT / REPLACE / REMOVE PACEMAKER  01/01/2018  . IR CT HEAD LTD  12/06/2017  . IR PERCUTANEOUS ART THROMBECTOMY/INFUSION INTRACRANIAL INC DIAG ANGIO  12/06/2017  . LOOP RECORDER INSERTION N/A 12/12/2017   Procedure: LOOP  RECORDER INSERTION;  Surgeon: Constance Haw, MD;  Location: [place] [facility];  Service: Cardiovascular;  Laterality: N/A;  . LOOP RECORDER REMOVAL  01/01/2018  . LOOP RECORDER REMOVAL N/A 01/01/2018   Procedure: LOOP RECORDER REMOVAL;  Surgeon: Constance Haw, MD;  Location: [place] [facility];  Service: Cardiovascular;  Laterality: N/A;  . NECK SURGERY    . PACEMAKER IMPLANT N/A 01/01/2018   Procedure: PACEMAKER IMPLANT;  Surgeon: Constance Haw, MD;  Location: [place] [facility];  Service: Cardiovascular;  Laterality: N/A;  . PHOTOCOAGULATION Right 12/12/2019   Procedure: Photocoagulation;  Surgeon: Jalene Mullet, MD;  Location: [place];  Service: Ophthalmology;  Laterality: Right;  . RADIOLOGY WITH ANESTHESIA N/A 12/06/2017   Procedure: CODE STROKE;  Surgeon: Luanne Bras, MD;  Location: [place];  Service: Radiology;  Laterality: N/A;  . SCLERAL BUCKLE WITH POSSIBLE 25 GAUGE PARS PLANA VITRECTOMY Right 12/12/2019   Procedure: SCLERAL BUCKLE WITH  PARS PLANA VITRECTOMY RETINAL DETACHMENT REPAIR;  Surgeon: Jalene Mullet, MD;  Location: [place];  Service: Ophthalmology;  Laterality: Right;  . TEE WITHOUT CARDIOVERSION N/A 12/10/2017   Procedure: TRANSESOPHAGEAL ECHOCARDIOGRAM (TEE);  Surgeon: Jerline Pain, MD;  Location[facility] ENDOSCOPY;  Service: Cardiovascular;  Laterality: N/A;  . THROMBECTOMY FEMORAL ARTERY Right 12/07/2017   Procedure: THROMBECTOMY RIGHT FEMORAL ARTERY;  Surgeon: Waynetta Sandy, MD;  Location: [place];  Service:  Vascular;  Laterality: Right;  . TONSILLECTOMY      Social History   Socioeconomic History  . Marital status: Married    Spouse name: [name]  . Number of children: 2  . Years of education: 36  . Highest education level: Not on file  Occupational History  . Not on file  Tobacco Use  . Smoking status: Current Every Day Smoker    Packs/day: 1.00    Years: 38.00    Pack years: 38.00    Types: Cigarettes  . Smokeless tobacco: Never  Used  Vaping Use  . Vaping Use: Never used  Substance and Sexual Activity  . Alcohol use: Yes    Comment: 01/01/2018 "nothing since stroke 11/2017"  . Drug use: No  . Sexual activity: Not Currently  Other Topics Concern  . Not on file  Social History Narrative   Patient is married ([name]) and lives at home with her family   Patient has two children.   Patient works at [employer].   Patient has a college education.   Patient is right-handed.   Patient drinks 3 cups of coffee M-F.      Social Determinants of Health   Financial Resource Strain: Not on file  Food Insecurity: Not on file  Transportation Needs: Not on file  Physical Activity: Not on file  Stress: Not on file  Social Connections: Not on file  Intimate Partner Violence: Not on file    Current Outpatient Medications on File Prior to Visit  Medication Sig Dispense Refill  . Accu-Chek Softclix Lancets lancets 1 each by Other route 4 (four) times daily. E11.9 100 each 12  . acetaminophen (TYLENOL) 500 MG tablet Take 1,000 mg by mouth every 6 (six) hours as needed for fever (pain). (Patient not taking: Reported on 10/06/2020)    . B Complex-Biotin-FA (MULTI-B COMPLEX) CAPS Take one a day ,B complex  multivitamin over the counter or use prenatal vitamin. 90 capsule 0  . BD PEN NEEDLE NANO 2ND GEN 32G X 4 MM MISC USE FOUR TIMES DAILY 400 each 3  . Blood Glucose Monitoring Suppl (ACCU-CHEK GUIDE ME) w/Device KIT 1 each by Does not apply route 4 (four) times daily. E11.9 1 kit 0  . glucose blood (ACCU-CHEK GUIDE) test strip 1 each by Other route 4 (four) times daily. E11.9 100 each 12  . insulin aspart (NOVOLOG FLEXPEN) 100 UNIT/ML FlexPen 3 times a day (just before each meal), 04-19-09, and pen needles 4/day 15 mL 11  . insulin detemir (LEVEMIR FLEXTOUCH) 100 UNIT/ML FlexPen Inject 20 Units into the skin at bedtime. 30 mL 3  . levETIRAcetam (KEPPRA) 500 MG tablet TAKE 1 TABLET(500 MG) BY MOUTH TWICE DAILY 180 tablet 4   . LORazepam (ATIVAN) 0.5 MG tablet Take 0.5-1 tablets (0.25-0.5 mg total) by mouth 2 (two) times daily as needed for anxiety. 45 tablet 0  . primidone (MYSOLINE) 250 MG tablet TAKE 1 TABLET(250 MG) BY MOUTH DAILY 30 tablet 6   No current facility-administered medications on file prior to visit.    No Known Allergies  Family History  Problem Relation Age of Onset  . Cancer Mother   . Diabetes Mother   . Heart disease Mother   . Parkinsonism Mother   . Diabetes Father   . Heart disease Father   . Dementia Father   . Cancer Maternal Grandmother 4       lung  . COPD Maternal Aunt     BP 132/80  Pulse 90   Ht 5' 3.5" (1.613 m)   Wt 181 lb (82.1 kg)   SpO2 96%   BMI 31.56 kg/m    Review of Systems She denies hypoglycemia    Objective:   Physical Exam VITAL SIGNS:  See vs page GENERAL: no distress Pulses: dorsalis pedis intact bilat.   MSK: no deformity of the feet CV: no leg edema Skin:  no ulcer on the feet.  normal color and temp on the feet. Neuro: sensation is intact to touch on the feet.   Ext: several toenails are absent.   Lab Results  Component Value Date   HGBA1C 7.2 (A) 10/04/2020       Assessment & Plan:  Insulin-requiring type 2 DM, with CVA: this is the best control this pt should aim for, given this regimen, which does match insulin to her changing needs throughout the day  Patient Instructions  check your blood sugar 4 times a day: before the 3 meals, and at bedtime.  also check if you have symptoms of your blood sugar being too high or too low.  please keep a record of the readings and bring it to your next appointment here (or you can bring the meter itself).  You can write it on any piece of paper.  please call us sooner if your blood sugar goes below 70, or if you have a lot of readings over 200.   Please continue the same 2 insulins.  Please come back for a follow-up appointment in 3 months.

## 2020-10-04 NOTE — Patient Instructions (Signed)
check your blood sugar 4 times a day: before the 3 meals, and at bedtime.  also check if you have symptoms of your blood sugar being too high or too low.  please keep a record of the readings and bring it to your next appointment here (or you can bring the meter itself).  You can write it on any piece of paper.  please call us sooner if your blood sugar goes below 70, or if you have a lot of readings over 200.   Please continue the same 2 insulins.  Please come back for a follow-up appointment in 3 months.

## 2020-10-06 ENCOUNTER — Ambulatory Visit: Payer: Medicare PPO | Admitting: Family Medicine

## 2020-10-06 ENCOUNTER — Other Ambulatory Visit: Payer: Self-pay

## 2020-10-06 ENCOUNTER — Encounter: Payer: Self-pay | Admitting: Family Medicine

## 2020-10-06 VITALS — BP 121/67 | HR 91 | Temp 98.3°F | Ht 63.0 in | Wt 180.6 lb

## 2020-10-06 DIAGNOSIS — Z794 Long term (current) use of insulin: Secondary | ICD-10-CM

## 2020-10-06 DIAGNOSIS — E785 Hyperlipidemia, unspecified: Secondary | ICD-10-CM

## 2020-10-06 DIAGNOSIS — I10 Essential (primary) hypertension: Secondary | ICD-10-CM

## 2020-10-06 DIAGNOSIS — E1149 Type 2 diabetes mellitus with other diabetic neurological complication: Secondary | ICD-10-CM | POA: Diagnosis not present

## 2020-10-06 DIAGNOSIS — F331 Major depressive disorder, recurrent, moderate: Secondary | ICD-10-CM | POA: Diagnosis not present

## 2020-10-06 DIAGNOSIS — I6930 Unspecified sequelae of cerebral infarction: Secondary | ICD-10-CM | POA: Diagnosis not present

## 2020-10-06 DIAGNOSIS — F17210 Nicotine dependence, cigarettes, uncomplicated: Secondary | ICD-10-CM

## 2020-10-06 MED ORDER — IRBESARTAN 150 MG PO TABS: ORAL_TABLET | ORAL | 3 refills | Status: DC

## 2020-10-06 MED ORDER — CLOPIDOGREL BISULFATE 75 MG PO TABS: 75.0000 mg | ORAL_TABLET | Freq: Every day | ORAL | 3 refills | Status: DC

## 2020-10-06 MED ORDER — ROSUVASTATIN CALCIUM 20 MG PO TABS: 20.0000 mg | ORAL_TABLET | Freq: Every day | ORAL | 3 refills | Status: DC

## 2020-10-06 MED ORDER — SERTRALINE HCL 100 MG PO TABS
ORAL_TABLET | ORAL | 1 refills | Status: DC
Start: 2020-10-06 — End: 2021-02-09

## 2020-10-06 NOTE — Progress Notes (Signed)
Subjective:  Patient ID: Brittany [name], female    DOB: Jan 05, 1961  Age: 60 y.o. MRN: [id number]  CC:  Chief Complaint  Patient presents with  . Transitions Of Care  . Diabetes    HPI Brittany [name] presents for   Transition of Care.  History of hypertension, hyperlipidemia, diabetes, prior CVA, fibromyalgia.  Depression, anxiety.  Prior pt of Dr. Pamella Pert.   Depression/anxiety Last seen by Kathrin Ruddy in December for medication refill.  Depression/anxiety discussed at that time with worsening symptoms, family stressors.  Referred to psychology as well as occupational therapy to help with residual deficits from her CVA. Has met with Conception Chancy for therapy. Has appt next week.  Still on sertraline 234m qd.  Lorazepam as needed only. No recent need. Controlled substance database (PDMP) reviewed. No concerns appreciated. Last filled 02/09/2019 OT has been helpful with her tremors. (had prior, but worse after CVA) Depression screen [facility] 2/9 10/06/2020 08/14/2020 11/22/2019 09/03/2018 02/26/2018  Decreased Interest 0 0 0 0 (No Data)  Down, Depressed, Hopeless 0 0 0 0 -  PHQ - 2 Score 0 0 0 0 -  Altered sleeping - - - - -  Tired, decreased energy - - - - -  Change in appetite - - - - -  Feeling bad or failure about yourself  - - - - -  Trouble concentrating - - - - -  Moving slowly or fidgety/restless - - - - -  Suicidal thoughts - - - - -  PHQ-9 Score - - - - -  Difficult doing work/chores - - - - -  Some recent data might be hidden     Diabetes History of type 2 diabetes with neurologic complications, on insulin. Followed by endocrinologist, Dr. Loanne Drilling. Last visit 2 days ago. Stable.  Active with cleaning around the home.  Lab Results  Component Value Date   HGBA1C 7.2 (A) 10/04/2020    CVA History of chronic ischemic left MCA stroke 2019, [facility] neurology. Last visit in March 27, 2020.  Continued on Keppra for seizure prophylaxis as per stroke seizure  previously. Residual expressive aphasia, cognitive impairment that were stable.  Continued on Plavix, Crestor for secondary stroke prevention.  Goal LDL less than 70.  Previously had atorvastatin change to Crestor.  Takes 20 mg daily. Primidone 250 mg daily for essential tremor.  No recent seizures.  Last ate 4 hrs ago.   Lab Results  Component Value Date   CHOL 239 (H) 11/22/2019   HDL 54 11/22/2019   LDLCALC 158 (H) 11/22/2019   LDLDIRECT 61 12/03/2013   TRIG 148 11/22/2019   CHOLHDL 4.4 11/22/2019   On irbesartan $RemoveBefor'150mg'XyGjHbXdqZeO$  qd.  BP Readings from Last 3 Encounters:  10/06/20 121/67  10/04/20 132/80  08/14/20 137/80   Nicotine addiction: Quit 1-2 months after CVA. Not ready to quit right now.   History Patient Active Problem List   Diagnosis Date Noted  . Bilateral high frequency sensorineural hearing loss 02/25/2019  . Non compliance with medical treatment 03/23/2018  . History of tobacco use disorder 01/01/2018  . Syncope and collapse 01/01/2018  . 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 cerebrovascular accident (CVA) ([place]) 12/12/2017  . Acute right hemiparesis ([place]) 12/12/2017  . History of completed stroke 12/12/2017  . Leukocytosis 12/12/2017  . Hypokalemia 12/12/2017  . Hypotension   . 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  Poor compliance with CPAP treatment 03/04/2017  . OSA and COPD overlap syndrome ([place]) 03/04/2017  . Simple chronic bronchitis ([place]) 03/04/2017  . MCI (mild cognitive impairment) 11/27/2016  . Left sided lacunar infarction ([place]) 03/11/2014  . Cerebrovascular small vessel disease 03/11/2014  . Insomnia, idiopathic 12/20/2013  . Snoring 12/06/2013  . Essential hypertension, benign 10/26/2012  . Hyperlipidemia LDL goal <70 10/26/2012  . Polypharmacy 10/26/2012  . DM (diabetes mellitus) ([place]) 10/13/2011  . Tremor 10/13/2011  . GERD  (gastroesophageal reflux disease) 10/13/2011  . Depression 10/13/2011   Past Medical History:  Diagnosis Date  . Asthma   . Cataract    Phreesia 10/03/2020  . Chronic lower back pain   . Depression   . Depression    Phreesia 10/03/2020  . Diabetes mellitus without complication ([place])    Phreesia 10/03/2020  . Family history of adverse reaction to anesthesia    Mother has trouble coming out of anesthesia; patient reports she has no problems  . Fibromyalgia   . High cholesterol   . Hypertension    Phreesia 10/03/2020  . Presence of permanent cardiac pacemaker   . Seizures ([place]) 11/2017   "day after she came home from hospital after having stroke" (01/01/2018)  . Stroke [facility]) 12/06/2017   "speech issues since" (01/01/2018)  . Type II diabetes mellitus ([place])    Past Surgical History:  Procedure Laterality Date  . CHOLECYSTECTOMY    . EYE SURGERY N/A    Phreesia 10/03/2020  . GAS/FLUID EXCHANGE Right 12/12/2019   Procedure: Gas/Fluid Exchange;  Surgeon: Jalene Mullet, MD;  Location: [place];  Service: Ophthalmology;  Laterality: Right;  . INSERT / REPLACE / REMOVE PACEMAKER  01/01/2018  . IR CT HEAD LTD  12/06/2017  . IR PERCUTANEOUS ART THROMBECTOMY/INFUSION INTRACRANIAL INC DIAG ANGIO  12/06/2017  . LOOP RECORDER INSERTION N/A 12/12/2017   Procedure: LOOP RECORDER INSERTION;  Surgeon: Constance Haw, MD;  Location: [place] [facility];  Service: Cardiovascular;  Laterality: N/A;  . LOOP RECORDER REMOVAL  01/01/2018  . LOOP RECORDER REMOVAL N/A 01/01/2018   Procedure: LOOP RECORDER REMOVAL;  Surgeon: Constance Haw, MD;  Location: [place] CV [facility];  Service: Cardiovascular;  Laterality: N/A;  . NECK SURGERY    . PACEMAKER IMPLANT N/A 01/01/2018   Procedure: PACEMAKER IMPLANT;  Surgeon: Constance Haw, MD;  Location: [place] CV LAB;  Service: Cardiovascular;  Laterality: N/A;  . PHOTOCOAGULATION Right 12/12/2019   Procedure: Photocoagulation;  Surgeon: Jalene Mullet, MD;  Location: [place];  Service: Ophthalmology;  Laterality: Right;  . RADIOLOGY WITH ANESTHESIA N/A 12/06/2017   Procedure: CODE STROKE;  Surgeon: Luanne Bras, MD;  Location: [place];  Service: Radiology;  Laterality: N/A;  . SCLERAL BUCKLE WITH POSSIBLE 25 GAUGE PARS PLANA VITRECTOMY Right 12/12/2019   Procedure: SCLERAL BUCKLE WITH  PARS PLANA VITRECTOMY RETINAL DETACHMENT REPAIR;  Surgeon: Jalene Mullet, MD;  Location: [place];  Service: Ophthalmology;  Laterality: Right;  . TEE WITHOUT CARDIOVERSION N/A 12/10/2017   Procedure: TRANSESOPHAGEAL ECHOCARDIOGRAM (TEE);  Surgeon: Jerline Pain, MD;  Location: [facility] ENDOSCOPY;  Service: Cardiovascular;  Laterality: N/A;  . THROMBECTOMY FEMORAL ARTERY Right 12/07/2017   Procedure: THROMBECTOMY RIGHT FEMORAL ARTERY;  Surgeon: Waynetta Sandy, MD;  Location: [place];  Service: Vascular;  Laterality: Right;  . TONSILLECTOMY     No Known Allergies Prior to Admission medications   Medication Sig Start Date End Date Taking? Authorizing Provider  Accu-Chek Softclix Lancets lancets 1 each by  Other route 4 (four) times daily. E11.9 05/22/20  Yes Renato Shin, MD  B Complex-Biotin-FA (MULTI-B COMPLEX) CAPS Take one a day ,B complex  multivitamin over the counter or use prenatal vitamin. 08/28/16  Yes Dohmeier, Asencion Partridge, MD  BD PEN NEEDLE NANO 2ND GEN 32G X 4 MM MISC USE FOUR TIMES DAILY 09/17/20  Yes Renato Shin, MD  Blood Glucose Monitoring Suppl (ACCU-CHEK GUIDE ME) w/Device KIT 1 each by Does not apply route 4 (four) times daily. E11.9 05/22/20  Yes Renato Shin, MD  clopidogrel (PLAVIX) 75 MG tablet Take 1 tablet (75 mg total) by mouth daily. 11/11/19  Yes Jacelyn Pi, Irma M, MD  glucose blood (ACCU-CHEK GUIDE) test strip 1 each by Other route 4 (four) times daily. E11.9 05/22/20  Yes Renato Shin, MD  insulin aspart (NOVOLOG FLEXPEN) 100 UNIT/ML FlexPen 3 times a day (just before each meal), 04-19-09, and pen needles 4/day 02/28/20  Yes  Renato Shin, MD  insulin detemir (LEVEMIR FLEXTOUCH) 100 UNIT/ML FlexPen Inject 20 Units into the skin at bedtime. 06/29/20  Yes Renato Shin, MD  irbesartan (AVAPRO) 150 MG tablet TAKE 1 TABLET(150 MG) BY MOUTH DAILY 11/11/19  Yes Jacelyn Pi, Irma M, MD  levETIRAcetam (KEPPRA) 500 MG tablet TAKE 1 TABLET(500 MG) BY MOUTH TWICE DAILY 02/08/20  Yes McCue, Janett Billow, NP  LORazepam (ATIVAN) 0.5 MG tablet Take 0.5-1 tablets (0.25-0.5 mg total) by mouth 2 (two) times daily as needed for anxiety. 03/11/19  Yes McCue, Janett Billow, NP  primidone (MYSOLINE) 250 MG tablet TAKE 1 TABLET(250 MG) BY MOUTH DAILY 09/18/20  Yes McCue, Janett Billow, NP  rosuvastatin (CRESTOR) 20 MG tablet Take 1 tablet (20 mg total) by mouth daily. 12/09/19  Yes Jacelyn Pi, Irma M, MD  sertraline (ZOLOFT) 100 MG tablet TAKE 2 TABLETS(200 MG) BY MOUTH AT BEDTIME 08/14/20  Yes Maximiano Coss, NP  acetaminophen (TYLENOL) 500 MG tablet Take 1,000 mg by mouth every 6 (six) hours as needed for fever (pain). Patient not taking: Reported on 10/06/2020    [provider]   Social History   Socioeconomic History  . Marital status: Married    Spouse name: [name]  . Number of children: 2  . Years of education: 76  . Highest education level: Not on file  Occupational History  . Not on file  Tobacco Use  . Smoking status: Current Every Day Smoker    Packs/day: 1.00    Years: 38.00    Pack years: 38.00    Types: Cigarettes  . Smokeless tobacco: Never Used  Vaping Use  . Vaping Use: Never used  Substance and Sexual Activity  . Alcohol use: Yes    Comment: 01/01/2018 "nothing since stroke 11/2017"  . Drug use: No  . Sexual activity: Not Currently  Other Topics Concern  . Not on file  Social History Narrative   Patient is married ([name]) and lives at home with her family   Patient has two children.   Patient works at [employer].   Patient has a college education.   Patient is right-handed.   Patient drinks 3 cups of coffee  M-F.      Social Determinants of Health   Financial Resource Strain: Not on file  Food Insecurity: Not on file  Transportation Needs: Not on file  Physical Activity: Not on file  Stress: Not on file  Social Connections: Not on file  Intimate Partner Violence: Not on file    Review of Systems   Objective:   Vitals:  10/06/20 1604  BP: 121/67  Pulse: 91  Temp: 98.3 F (36.8 C)  TempSrc: Temporal  SpO2: 95%  Weight: 180 lb 9.6 oz (81.9 kg)  Height: $Remove'5\' 3"'RgQmIDi$  (1.6 m)     Physical Exam Vitals reviewed.  Constitutional:      Appearance: She is well-developed and well-nourished.  HENT:     Head: Normocephalic and atraumatic.  Eyes:     Extraocular Movements: EOM normal.     Conjunctiva/sclera: Conjunctivae normal.     Pupils: Pupils are equal, round, and reactive to light.  Neck:     Vascular: No carotid bruit.  Cardiovascular:     Rate and Rhythm: Normal rate and regular rhythm.     Pulses: Intact distal pulses.     Heart sounds: Normal heart sounds.  Pulmonary:     Effort: Pulmonary effort is normal.     Breath sounds: Normal breath sounds.  Abdominal:     Palpations: Abdomen is soft. There is no pulsatile mass.     Tenderness: There is no abdominal tenderness.  Skin:    General: Skin is warm and dry.  Neurological:     Mental Status: She is alert and oriented to person, place, and time.     Comments: Slower speech at times, but intelligible.    Psychiatric:        Mood and Affect: Mood and affect and mood normal.        Behavior: Behavior normal.     31 minutes spent during visit, greater than 50% counseling and assimilation of information, chart review, and discussion of plan.    Assessment & Plan:  Brittany [name] is a 60 y.o. female . History of CVA with residual deficit - Plan: clopidogrel (PLAVIX) 75 MG tablet  -Continue ongoing follow-up with neurology including seizure prophylaxis and Plavix.  No med changes.  Moderate episode of recurrent  major depressive disorder ([place]) - Plan: sertraline (ZOLOFT) 100 MG tablet  -Stable with current regimen, also followed by therapist.  No changes.  Type 2 diabetes mellitus with other neurologic complication, with long-term current use of insulin (HCC)  -Continue follow-up with endocrinology, no med changes  Essential hypertension, benign - Plan: irbesartan (AVAPRO) 150 MG tablet, Comprehensive metabolic panel  -Check labs, continue irbesartan same dose  Hyperlipidemia LDL goal <70 - Plan: rosuvastatin (CRESTOR) 20 MG tablet, Comprehensive metabolic panel, Lipid panel  -Tolerating current statin dose, continue same.  Nicotine addiction  -Importance of smoking cessation discussed including with her past medical history.  Handout provided, advised to let me know if I can help.  Meds ordered this encounter  Medications  . irbesartan (AVAPRO) 150 MG tablet    Sig: TAKE 1 TABLET(150 MG) BY MOUTH DAILY    Dispense:  90 tablet    Refill:  3  . rosuvastatin (CRESTOR) 20 MG tablet    Sig: Take 1 tablet (20 mg total) by mouth daily.    Dispense:  90 tablet    Refill:  3  . clopidogrel (PLAVIX) 75 MG tablet    Sig: Take 1 tablet (75 mg total) by mouth daily.    Dispense:  90 tablet    Refill:  3  . sertraline (ZOLOFT) 100 MG tablet    Sig: TAKE 2 TABLETS(200 MG) BY MOUTH AT BEDTIME    Dispense:  180 tablet    Refill:  1    Ok to place on hold   Patient Instructions   I will check your labs today. No  med changes today. Keep follow up with therapist.  When you are ready to quit smoking I am happy to help.  Keep follow up with specialists, and recheck with me in 6 months.  I am happy to see you sooner if needed.  Let me know if there are questions and take care.    Steps to Quit Smoking Smoking tobacco is the leading cause of preventable death. It can affect almost every organ in the body. Smoking puts you and those around you at risk for developing many serious chronic diseases. Quitting  smoking can be difficult, but it is one of the best things that you can do for your health. It is never too late to quit. How do I get ready to quit? When you decide to quit smoking, create a plan to help you succeed. Before you quit:  Pick a date to quit. Set a date within the next 2 weeks to give you time to prepare.  Write down the reasons why you are quitting. Keep this list in places where you will see it often.  Tell your family, friends, and co-workers that you are quitting. Support from your loved ones can make quitting easier.  Talk with your health care provider about your options for quitting smoking.  Find out what treatment options are covered by your health insurance.  Identify people, places, things, and activities that make you want to smoke (triggers). Avoid them. What first steps can I take to quit smoking?  Throw away all cigarettes at home, at work, and in your car.  Throw away smoking accessories, such as Scientist, research (medical).  Clean your car. Make sure to empty the ashtray.  Clean your home, including curtains and carpets. What strategies can I use to quit smoking? Talk with your health care provider about combining strategies, such as taking medicines while you are also receiving in-person counseling. Using these two strategies together makes you more likely to succeed in quitting than if you used either strategy on its own.  If you are pregnant or breastfeeding, talk with your health care provider about finding counseling or other support strategies to quit smoking. Do not take medicine to help you quit smoking unless your health care provider tells you to do so. To quit smoking: Quit right away  Quit smoking completely, instead of gradually reducing how much you smoke over a period of time. Research shows that stopping smoking right away is more successful than gradually quitting.  Attend in-person counseling to help you build problem-solving skills. You are  more likely to succeed in quitting if you attend counseling sessions regularly. Even short sessions of 10 minutes can be effective. Take medicine You may take medicines to help you quit smoking. Some medicines require a prescription and some you can purchase over-the-counter. Medicines may have nicotine in them to replace the nicotine in cigarettes. Medicines may:  Help to stop cravings.  Help to relieve withdrawal symptoms. Your health care provider may recommend:  Nicotine patches, gum, or lozenges.  Nicotine inhalers or sprays.  Non-nicotine medicine that is taken by mouth. Find resources Find resources and support systems that can help you to quit smoking and remain smoke-free after you quit. These resources are most helpful when you use them often. They include:  Online chats with a Social worker.  Telephone quitlines.  Printed Furniture conservator/restorer.  Support groups or group counseling.  Text messaging programs.  Mobile phone apps or applications. Use apps that can help you stick to  your quit plan by providing reminders, tips, and encouragement. There are many free apps for mobile devices as well as websites. Examples include Quit Guide from the [employer] and smokefree.gov   What things can I do to make it easier to quit?  Reach out to your family and friends for support and encouragement. Call telephone quitlines (1-800-QUIT-NOW), reach out to support groups, or work with a counselor for support.  Ask people who smoke to avoid smoking around you.  Avoid places that trigger you to smoke, such as bars, parties, or smoke-break areas at work.  Spend time with people who do not smoke.  Lessen the stress in your life. Stress can be a smoking trigger for some people. To lessen stress, try: ? Exercising regularly. ? Doing deep-breathing exercises. ? Doing yoga. ? Meditating. ? Performing a body scan. This involves closing your eyes, scanning your body from head to toe, and noticing which  parts of your body are particularly tense. Try to relax the muscles in those areas.   How will I feel when I quit smoking? Day 1 to 3 weeks Within the first 24 hours of quitting smoking, you may start to feel withdrawal symptoms. These symptoms are usually most noticeable 2-3 days after quitting, but they usually do not last for more than 2-3 weeks. You may experience these symptoms:  Mood swings.  Restlessness, anxiety, or irritability.  Trouble concentrating.  Dizziness.  Strong cravings for sugary foods and nicotine.  Mild weight gain.  Constipation.  Nausea.  Coughing or a sore throat.  Changes in how the medicines that you take for unrelated issues work in your body.  Depression.  Trouble sleeping (insomnia). Week 3 and afterward After the first 2-3 weeks of quitting, you may start to notice more positive results, such as:  Improved sense of smell and taste.  Decreased coughing and sore throat.  Slower heart rate.  Lower blood pressure.  Clearer skin.  The ability to breathe more easily.  Fewer sick days. Quitting smoking can be very challenging. Do not get discouraged if you are not successful the first time. Some people need to make many attempts to quit before they achieve long-term success. Do your best to stick to your quit plan, and talk with your health care provider if you have any questions or concerns. Summary  Smoking tobacco is the leading cause of preventable death. Quitting smoking is one of the best things that you can do for your health.  When you decide to quit smoking, create a plan to help you succeed.  Quit smoking right away, not slowly over a period of time.  When you start quitting, seek help from your health care provider, family, or friends. This information is not intended to replace advice given to you by your health care provider. Make sure you discuss any questions you have with your health care provider. Document Revised:  05/21/2019 Document Reviewed: 11/14/2018 Elsevier Patient Education  [place].  If you have lab work done today you will be contacted with your lab results within the next 2 weeks.  If you have not heard from [place] then please contact us. The fastest way to get your results is to register for My Chart.   IF you received an x-ray today, you will receive an invoice from [facility]. Please contact [facility] at [phone] with questions or concerns regarding your invoice.   IF you received labwork today, you will receive an invoice from [facility]. Please contact  [facility] at [phone] with questions or concerns regarding your invoice.   Our billing staff will not be able to assist you with questions regarding bills from these companies.  You will be contacted with the lab results as soon as they are available. The fastest way to get your results is to activate your My Chart account. Instructions are located on the last page of this paperwork. If you have not heard from [place] regarding the results in 2 weeks, please contact this office.          Signed, Merri Ray, MD [facility]

## 2020-10-06 NOTE — Patient Instructions (Addendum)
I will check your labs today. No med changes today. Keep follow up with therapist.  When you are ready to quit smoking I am happy to help.  Keep follow up with specialists, and recheck with me in 6 months.  I am happy to see you sooner if needed.  Let me know if there are questions and take care.    Steps to Quit Smoking Smoking tobacco is the leading cause of preventable death. It can affect almost every organ in the body. Smoking puts you and those around you at risk for developing many serious chronic diseases. Quitting smoking can be difficult, but it is one of the best things that you can do for your health. It is never too late to quit. How do I get ready to quit? When you decide to quit smoking, create a plan to help you succeed. Before you quit:  Pick a date to quit. Set a date within the next 2 weeks to give you time to prepare.  Write down the reasons why you are quitting. Keep this list in places where you will see it often.  Tell your family, friends, and co-workers that you are quitting. Support from your loved ones can make quitting easier.  Talk with your health care provider about your options for quitting smoking.  Find out what treatment options are covered by your health insurance.  Identify people, places, things, and activities that make you want to smoke (triggers). Avoid them. What first steps can I take to quit smoking?  Throw away all cigarettes at home, at work, and in your car.  Throw away smoking accessories, such as Set designer.  Clean your car. Make sure to empty the ashtray.  Clean your home, including curtains and carpets. What strategies can I use to quit smoking? Talk with your health care provider about combining strategies, such as taking medicines while you are also receiving in-person counseling. Using these two strategies together makes you more likely to succeed in quitting than if you used either strategy on its own.  If you are  pregnant or breastfeeding, talk with your health care provider about finding counseling or other support strategies to quit smoking. Do not take medicine to help you quit smoking unless your health care provider tells you to do so. To quit smoking: Quit right away  Quit smoking completely, instead of gradually reducing how much you smoke over a period of time. Research shows that stopping smoking right away is more successful than gradually quitting.  Attend in-person counseling to help you build problem-solving skills. You are more likely to succeed in quitting if you attend counseling sessions regularly. Even short sessions of 10 minutes can be effective. Take medicine You may take medicines to help you quit smoking. Some medicines require a prescription and some you can purchase over-the-counter. Medicines may have nicotine in them to replace the nicotine in cigarettes. Medicines may:  Help to stop cravings.  Help to relieve withdrawal symptoms. Your health care provider may recommend:  Nicotine patches, gum, or lozenges.  Nicotine inhalers or sprays.  Non-nicotine medicine that is taken by mouth. Find resources Find resources and support systems that can help you to quit smoking and remain smoke-free after you quit. These resources are most helpful when you use them often. They include:  Online chats with a Veterinary surgeon.  Telephone quitlines.  Printed Materials engineer.  Support groups or group counseling.  Text messaging programs.  Mobile phone apps or applications. Use apps  that can help you stick to your quit plan by providing reminders, tips, and encouragement. There are many free apps for mobile devices as well as websites. Examples include Quit Guide from the [employer] and smokefree.gov   What things can I do to make it easier to quit?  Reach out to your family and friends for support and encouragement. Call telephone quitlines (1-800-QUIT-NOW), reach out to support groups, or  work with a counselor for support.  Ask people who smoke to avoid smoking around you.  Avoid places that trigger you to smoke, such as bars, parties, or smoke-break areas at work.  Spend time with people who do not smoke.  Lessen the stress in your life. Stress can be a smoking trigger for some people. To lessen stress, try: ? Exercising regularly. ? Doing deep-breathing exercises. ? Doing yoga. ? Meditating. ? Performing a body scan. This involves closing your eyes, scanning your body from head to toe, and noticing which parts of your body are particularly tense. Try to relax the muscles in those areas.   How will I feel when I quit smoking? Day 1 to 3 weeks Within the first 24 hours of quitting smoking, you may start to feel withdrawal symptoms. These symptoms are usually most noticeable 2-3 days after quitting, but they usually do not last for more than 2-3 weeks. You may experience these symptoms:  Mood swings.  Restlessness, anxiety, or irritability.  Trouble concentrating.  Dizziness.  Strong cravings for sugary foods and nicotine.  Mild weight gain.  Constipation.  Nausea.  Coughing or a sore throat.  Changes in how the medicines that you take for unrelated issues work in your body.  Depression.  Trouble sleeping (insomnia). Week 3 and afterward After the first 2-3 weeks of quitting, you may start to notice more positive results, such as:  Improved sense of smell and taste.  Decreased coughing and sore throat.  Slower heart rate.  Lower blood pressure.  Clearer skin.  The ability to breathe more easily.  Fewer sick days. Quitting smoking can be very challenging. Do not get discouraged if you are not successful the first time. Some people need to make many attempts to quit before they achieve long-term success. Do your best to stick to your quit plan, and talk with your health care provider if you have any questions or concerns. Summary  Smoking  tobacco is the leading cause of preventable death. Quitting smoking is one of the best things that you can do for your health.  When you decide to quit smoking, create a plan to help you succeed.  Quit smoking right away, not slowly over a period of time.  When you start quitting, seek help from your health care provider, family, or friends. This information is not intended to replace advice given to you by your health care provider. Make sure you discuss any questions you have with your health care provider. Document Revised: 05/21/2019 Document Reviewed: 11/14/2018 Elsevier Patient Education  2021 [employer].  If you have lab work done today you will be contacted with your lab results within the next 2 weeks.  If you have not heard from [place] then please contact us. The fastest way to get your results is to register for My Chart.   IF you received an x-ray today, you will receive an invoice from [facility]. Please contact [facility] at [phone] with questions or concerns regarding your invoice.   IF you received labwork today, you will receive  an Pharmacologist from [employer]. Please contact [facility] at [phone] with questions or concerns regarding your invoice.   Our billing staff will not be able to assist you with questions regarding bills from these companies.  You will be contacted with the lab results as soon as they are available. The fastest way to get your results is to activate your My Chart account. Instructions are located on the last page of this paperwork. If you have not heard from [place] regarding the results in 2 weeks, please contact this office.

## 2020-10-07 LAB — COMPREHENSIVE METABOLIC PANEL
ALT: 17 IU/L (ref 0–32)
AST: 15 IU/L (ref 0–40)
Albumin/Globulin Ratio: 1.6 (ref 1.2–2.2)
Albumin: 4.3 g/dL (ref 3.8–4.8)
Alkaline Phosphatase: 131 IU/L — ABNORMAL HIGH (ref 44–121)
BUN/Creatinine Ratio: 16 (ref 12–28)
BUN: 11 mg/dL (ref 8–27)
Bilirubin Total: <0.2 mg/dL (ref 0.0–1.2)
CO2: 23 mmol/L (ref 20–29)
Calcium: 9.4 mg/dL (ref 8.7–10.3)
Chloride: 104 mmol/L (ref 96–106)
Creatinine, Ser: 0.67 mg/dL (ref 0.57–1.00)
GFR calc Af Amer: 110 mL/min/{1.73_m2} (ref >59)
GFR calc non Af Amer: 95 mL/min/{1.73_m2} (ref >59)
Globulin, Total: 2.7 g/dL (ref 1.5–4.5)
Glucose: 165 mg/dL — ABNORMAL HIGH (ref 65–99)
Potassium: 4.4 mmol/L (ref 3.5–5.2)
Sodium: 139 mmol/L (ref 134–144)
Total Protein: 7 g/dL (ref 6.0–8.5)

## 2020-10-07 LAB — LIPID PANEL
Chol/HDL Ratio: 3.2 ratio (ref 0.0–4.4)
Cholesterol, Total: 144 mg/dL (ref 100–199)
HDL: 45 mg/dL (ref >39)
LDL Chol Calc (NIH): 75 mg/dL (ref 0–99)
Triglycerides: 139 mg/dL (ref 0–149)
VLDL Cholesterol Cal: 24 mg/dL (ref 5–40)

## 2020-10-09 ENCOUNTER — Ambulatory Visit (INDEPENDENT_AMBULATORY_CARE_PROVIDER_SITE_OTHER): Payer: Medicare PPO | Admitting: Psychologist

## 2020-10-09 DIAGNOSIS — F321 Major depressive disorder, single episode, moderate: Secondary | ICD-10-CM

## 2020-10-10 ENCOUNTER — Other Ambulatory Visit: Payer: Self-pay

## 2020-10-10 ENCOUNTER — Ambulatory Visit: Payer: Medicare PPO | Attending: Registered Nurse | Admitting: Occupational Therapy

## 2020-10-10 ENCOUNTER — Ambulatory Visit: Payer: Medicare PPO

## 2020-10-10 DIAGNOSIS — R4701 Aphasia: Secondary | ICD-10-CM | POA: Diagnosis not present

## 2020-10-10 DIAGNOSIS — R278 Other lack of coordination: Secondary | ICD-10-CM | POA: Diagnosis not present

## 2020-10-10 DIAGNOSIS — R251 Tremor, unspecified: Secondary | ICD-10-CM | POA: Diagnosis not present

## 2020-10-10 DIAGNOSIS — M6281 Muscle weakness (generalized): Secondary | ICD-10-CM | POA: Diagnosis not present

## 2020-10-10 NOTE — Therapy (Signed)
[facility] [address] [place], [place], [address] Phone: [phone]   Fax:  [phone]  Occupational Therapy Treatment  Patient Details  Name: Brittany [name] MRN: [id number] Date of Birth: [birth year] Referring Provider (OT): Maximiano Coss, NP   Encounter Date: 10/10/2020   OT End of Session - 10/10/20 1322    Visit Number 7    Number of Visits 11    Date for OT Re-Evaluation 10/11/20    Authorization Type [employer]--needs Auth    Authorization Time Period cert. date 09/11/20-12/08/20 week 5/5 - 11/03/20    Authorization - Visit Number 7    Authorization - Number of Visits 10    Progress Note Due on Visit 10    OT Start Time 1321    OT Stop Time 1400    OT Time Calculation (min) 39 min    Activity Tolerance Patient tolerated treatment well    Behavior During Therapy WFL for tasks assessed/performed           Past Medical History:  Diagnosis Date  . Asthma   . Cataract    Phreesia 10/03/2020  . Chronic lower back pain   . Depression   . Depression    Phreesia 10/03/2020  . Diabetes mellitus without complication ([place])    Phreesia 10/03/2020  . Family history of adverse reaction to anesthesia    Mother has trouble coming out of anesthesia; patient reports she has no problems  . Fibromyalgia   . High cholesterol   . Hypertension    Phreesia 10/03/2020  . Presence of permanent cardiac pacemaker   . Seizures ([place]) 11/2017   "day after she came home from hospital after having stroke" (01/01/2018)  . Stroke [facility]) 12/06/2017   "speech issues since" (01/01/2018)  . Type II diabetes mellitus ([place])     Past Surgical History:  Procedure Laterality Date  . CHOLECYSTECTOMY    . EYE SURGERY N/A    Phreesia 10/03/2020  . GAS/FLUID EXCHANGE Right 12/12/2019   Procedure: Gas/Fluid Exchange;  Surgeon: Jalene Mullet, MD;  Location: [facility];  Service: Ophthalmology;  Laterality: Right;  . INSERT / REPLACE /  REMOVE PACEMAKER  01/01/2018  . IR CT HEAD LTD  12/06/2017  . IR PERCUTANEOUS ART THROMBECTOMY/INFUSION INTRACRANIAL INC DIAG ANGIO  12/06/2017  . LOOP RECORDER INSERTION N/A 12/12/2017   Procedure: LOOP RECORDER INSERTION;  Surgeon: Constance Haw, MD;  Location: [facility] CV LAB;  Service: Cardiovascular;  Laterality: N/A;  . LOOP RECORDER REMOVAL  01/01/2018  . LOOP RECORDER REMOVAL N/A 01/01/2018   Procedure: LOOP RECORDER REMOVAL;  Surgeon: Constance Haw, MD;  Location: [facility] CV LAB;  Service: Cardiovascular;  Laterality: N/A;  . NECK SURGERY    . PACEMAKER IMPLANT N/A 01/01/2018   Procedure: PACEMAKER IMPLANT;  Surgeon: Constance Haw, MD;  Location: [facility] CV LAB;  Service: Cardiovascular;  Laterality: N/A;  . PHOTOCOAGULATION Right 12/12/2019   Procedure: Photocoagulation;  Surgeon: Jalene Mullet, MD;  Location: [facility];  Service: Ophthalmology;  Laterality: Right;  . RADIOLOGY WITH ANESTHESIA N/A 12/06/2017   Procedure: CODE STROKE;  Surgeon: Luanne Bras, MD;  Location: [facility];  Service: Radiology;  Laterality: N/A;  . SCLERAL BUCKLE WITH POSSIBLE 25 GAUGE PARS PLANA VITRECTOMY Right 12/12/2019   Procedure: SCLERAL BUCKLE WITH  PARS PLANA VITRECTOMY RETINAL DETACHMENT REPAIR;  Surgeon: Jalene Mullet, MD;  Location: [facility];  Service: Ophthalmology;  Laterality: Right;  . TEE WITHOUT CARDIOVERSION N/A 12/10/2017  Procedure: TRANSESOPHAGEAL ECHOCARDIOGRAM (TEE);  Surgeon: Jerline Pain, MD;  Location[facility] ENDOSCOPY;  Service: Cardiovascular;  Laterality: N/A;  . THROMBECTOMY FEMORAL ARTERY Right 12/07/2017   Procedure: THROMBECTOMY RIGHT FEMORAL ARTERY;  Surgeon: Waynetta Sandy, MD;  Location: [facility];  Service: Vascular;  Laterality: Right;  . TONSILLECTOMY      There were no vitals filed for this visit.   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 No/denies             Began checking goals and discussing progress and d/c vs. Renewal (see goals section below).  Pt tearful today about her grandchildren and reports desire to be able to scrapbook and journal (on computer for them).  Pt is unsure whether to continue or d/c; therefore will discuss further next session.  Pt to identify specific activities/concerns to work on if she wants to continue.  Simulated scrapbook activity:  Taping down "photos", writing simple caption with foam grip, tracing shapes, using stamps.  Pt able to complete with incr time and good accuracy.   Reviewed and further discussed strategies/AE for crafts/scrapbooking.    Typing simple sentence on computer for "journal."  Pt able to type with min difficulty and incr time, but appeared to demo more difficulty with speech/language aspect (getting out/determining what she wanted to say) vs. Coordination for typing.    Encouraged pt to start simple and try activities like journaling 1-2 sentences, crafts, and scrapbooking as pt appears hesitant to start and has decr confidence in abilities.  Therapist provided encouragement and support when pt became tearful during session.             OT Long Term Goals - 10/10/20 1324      OT LONG TERM GOAL #1   Title Pt will be independent with HEP for coordination.    Time 5    Period Weeks    Status Achieved      OT LONG TERM GOAL #2   Title Pt will be indpendent with R shoulder strengthening HEP (including wt. bearing).    Time 5    Period Weeks    Status On-going   met for prone exercises and modified quadraped rock     OT LONG TERM GOAL  #3   Title Pt will verbalize understanding of compensation strategies/AE for tremors during ADLs/IADLs (including writing, eating, using computer, and crafts).    Time 5    Period Weeks    Status On-going      OT LONG TERM GOAL #4   Title Pt will improve R hand coordination for ADLs as shown by improving time on 9-hole peg test by at least 5 sec.    Baseline 52sec.    Time 5    Period Weeks    Status On-going   10/10/20:  48.34sec     OT LONG TERM GOAL #5   Title Pt will verbalize understanding of craft-type activies that are appropriate and strategies to participate.    Time 5    Period Weeks    Status Achieved  Plan - 10/10/20 1324    Clinical Impression Statement Pt is progressing towards goals.  Pt reports incr ease with writing with foam grip.  Pt verbalizes understanding of AE/strategies for crafts.  Discussed possible d/c next session; however, pt tearful today (will discuss further next session).    OT Occupational Profile and History Detailed Assessment- Review of Records and additional review of physical, cognitive, psychosocial history related to current functional performance    Occupational performance deficits (Please refer to evaluation for details): ADL's;IADL's;Leisure    Body Structure / Function / Physical Skills ADL;Strength;Dexterity;UE functional use;IADL;Sensation;Coordination;FMC    Rehab Potential Fair    Clinical Decision Making Several treatment options, min-mod task modification necessary    Comorbidities Affecting Occupational Performance: May have comorbidities impacting occupational performance    Modification or Assistance to Complete Evaluation  Min-Moderate modification of tasks or assist with assess necessary to complete eval    OT Frequency 2x / week    OT Duration --   5 weeks   OT Treatment/Interventions Self-care/ADL training;DME and/or AE instruction;Therapeutic activities;Therapeutic exercise;Neuromuscular  education;Patient/family education    Plan discuss plans to renew vs d/c (likely d/c, however, pt unsure and tearful today), check remaining goals and additional strategies for ADLs prn, ?consider low range theraband    Recommended Other Services speech therapy--pt reports speech decline    Consulted and Agree with Plan of Care Patient           Patient will benefit from skilled therapeutic intervention in order to improve the following deficits and impairments:   Body Structure / Function / Physical Skills: ADL,Strength,Dexterity,UE functional use,IADL,Sensation,Coordination,FMC       Visit Diagnosis: Other lack of coordination  Tremor  Muscle weakness (generalized)    Problem List Patient Active Problem List   Diagnosis Date Noted  . Bilateral high frequency sensorineural hearing loss 02/25/2019  . Non compliance with medical treatment 03/23/2018  . History of tobacco use disorder 01/01/2018  . Syncope and collapse 01/01/2018  . 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 Acute right hemiparesis ([place]) 12/12/2017  . History of completed stroke 12/12/2017  . Leukocytosis 12/12/2017  . Hypokalemia 12/12/2017  . Hypotension   . 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 Poor compliance with CPAP treatment 03/04/2017  . OSA and COPD overlap syndrome ([place]) 03/04/2017  . Simple chronic bronchitis ([place]) 03/04/2017  . MCI (mild cognitive impairment) 11/27/2016  . Left sided lacunar infarction ([place]) 03/11/2014  . Cerebrovascular small vessel disease 03/11/2014  . Insomnia, idiopathic 12/20/2013  . Snoring 12/06/2013  . Essential hypertension, benign 10/26/2012  . Hyperlipidemia LDL goal <70 10/26/2012  . Polypharmacy 10/26/2012  . DM (diabetes mellitus) ([place]) 10/13/2011  . Tremor 10/13/2011  . GERD (gastroesophageal reflux  disease) 10/13/2011  . Depression 10/13/2011    [facility] 10/10/2020, 2:21 PM  [facility] [address] [place], [place], [address] Phone: [phone]   Fax:  [phone]  Name: Brittany [name] MRN: [id number] Date of Birth: [birth year]   Vianne Bulls, OTR/L [facility] [address] [place], [place]  [address] [phone] phone [phone] 10/10/20 2:36 PM

## 2020-10-11 NOTE — Therapy (Signed)
[facility] [address] [place], [place], [address] Phone: [phone]   Fax:  [phone]  Speech Language Pathology Evaluation  Patient Details  Name: Brittany [name] MRN: [id number] Date of Birth: May 08, 1961 Referring Provider (SLP): Janeece Agee, NP   Encounter Date: 10/10/2020   End of Session - 10/11/20 0905    Visit Number 1    Number of Visits 13    Date for SLP Re-Evaluation 01/05/21    SLP Start Time 1451    SLP Stop Time  1532    SLP Time Calculation (min) 41 min    Activity Tolerance Patient tolerated treatment well           Past Medical History:  Diagnosis Date  . Asthma   . Cataract    Phreesia 10/03/2020  . Chronic lower back pain   . Depression   . Depression    Phreesia 10/03/2020  . Diabetes mellitus without complication (HCC)    Phreesia 10/03/2020  . Family history of adverse reaction to anesthesia    Mother has trouble coming out of anesthesia; patient reports she has no problems  . Fibromyalgia   . High cholesterol   . Hypertension    Phreesia 10/03/2020  . Presence of permanent cardiac pacemaker   . Seizures (HCC) 11/2017   "day after she came home from hospital after having stroke" (01/01/2018)  . Stroke [facility]) 12/06/2017   "speech issues since" (01/01/2018)  . Type II diabetes mellitus (HCC)     Past Surgical History:  Procedure Laterality Date  . CHOLECYSTECTOMY    . EYE SURGERY N/A    Phreesia 10/03/2020  . GAS/FLUID EXCHANGE Right 12/12/2019   Procedure: Gas/Fluid Exchange;  Surgeon: Carmela Rima, MD;  Location: [facility] OR;  Service: Ophthalmology;  Laterality: Right;  . INSERT / REPLACE / REMOVE PACEMAKER  01/01/2018  . IR CT HEAD LTD  12/06/2017  . IR PERCUTANEOUS ART THROMBECTOMY/INFUSION INTRACRANIAL INC DIAG ANGIO  12/06/2017  . LOOP RECORDER INSERTION N/A 12/12/2017   Procedure: LOOP RECORDER INSERTION;  Surgeon: Regan Lemming, MD;  [facility]: [facility] INVASIVE CV  LAB;  Service: Cardiovascular;  Laterality: N/A;  . LOOP RECORDER REMOVAL  01/01/2018  . LOOP RECORDER REMOVAL N/A 01/01/2018   Procedure: LOOP RECORDER REMOVAL;  Surgeon: Regan Lemming, MD;  Location: [facility] INVASIVE CV LAB;  Service: Cardiovascular;  Laterality: N/A;  . NECK SURGERY    . PACEMAKER IMPLANT N/A 01/01/2018   Procedure: PACEMAKER IMPLANT;  Surgeon: Regan Lemming, MD;  Location: [facility] INVASIVE CV LAB;  Service: Cardiovascular;  Laterality: N/A;  . PHOTOCOAGULATION Right 12/12/2019   Procedure: Photocoagulation;  Surgeon: Carmela Rima, MD;  Location: [facility] OR;  Service: Ophthalmology;  Laterality: Right;  . RADIOLOGY WITH ANESTHESIA N/A 12/06/2017   Procedure: CODE STROKE;  Surgeon: Julieanne Cotton, MD;  Location: [facility] OR;  Service: Radiology;  Laterality: N/A;  . SCLERAL BUCKLE WITH POSSIBLE 25 GAUGE PARS PLANA VITRECTOMY Right 12/12/2019   Procedure: SCLERAL BUCKLE WITH  PARS PLANA VITRECTOMY RETINAL DETACHMENT REPAIR;  Surgeon: Carmela Rima, MD;  Location: [facility] OR;  Service: Ophthalmology;  Laterality: Right;  . TEE WITHOUT CARDIOVERSION N/A 12/10/2017   Procedure: TRANSESOPHAGEAL ECHOCARDIOGRAM (TEE);  Surgeon: Jake Bathe, MD;  Location: [facility] ENDOSCOPY;  Service: Cardiovascular;  Laterality: N/A;  . THROMBECTOMY FEMORAL ARTERY Right 12/07/2017   Procedure: THROMBECTOMY RIGHT FEMORAL ARTERY;  Surgeon: Maeola Harman, MD;  Location: [facility] OR;  Service: Vascular;  Laterality: Right;  . TONSILLECTOMY  There were no vitals filed for this visit.   Subjective Assessment - 10/11/20 0839    Subjective Pt reports her speech has declined since the pandemic. "Sometimes - sometimes - - - he'll say - - spit it - spit it out - out and I - -  won't be able to."             SLP Evaluation [facility] - 10/10/20 1502      SLP Visit Information   SLP Received On 10/10/20    Referring Provider (SLP) Janeece Agee, NP    Onset Date Spring/summer 2019    Medical Diagnosis CVA       Subjective   Patient/Family Stated Goal Improve expressive communication      Prior Functional Status   Cognitive/Linguistic Baseline Baseline deficits    Baseline deficit details expressive aphasia s/p CVA 2019    Type of Home House     Lives With Family   Husband, son &grandkids, three grandkids and baby   Vocation Unemployed      Cognition   Overall Cognitive Status Within Functional Limits for tasks assessed      Auditory Comprehension   Overall Auditory Comprehension Appears within functional limits for tasks assessed      Verbal Expression   Overall Verbal Expression Impaired    Level of Generative/Spontaneous Verbalization Sentence;Conversation   hesitant and broken fluidity due to pauses/dysfluency   Other Verbal Expression Comments Pt with less dysfluency/hestitancy with an unfamiliar reading task, suggesting aphasia instead of verbal apraxia.         Boston Naming Test - 2 was used today to assess pt's word finding skills. The "half-norms" were used and pt looked at the odd numbered items with a score of 19. "Half-norms" are average 27.9 as the mean, with 1.9 as average standard deviation (SD).  Using these norms, pt was outside of the normal range for word finding and she endorsed that some of the pictures she had difficulty naming would not have been difficult for her prior to her CVA. Pt had some phonemically-related neologism for target pictures today (e.g., "lingo"/igloo, "aspagus"/asparagus, "spinnix"/sphinx).   As described above, pt's conversational speech was usually hesitant and broken, however when she read an unfamiliar passage she became mostly fluent, suggesting aphasia rather than verbal apraxia of speech.SLP believes pt realizes she will make phonemic errors and so indirectly modifies the fluidity of her speech to compensate.  Today, in diagnostic therapy task, SLP had pt reduce her speech rate and pt conversational speech fluidity improved.                      SLP Short Term Goals - 10/11/20 0854      SLP SHORT TERM GOAL #1   Title Pt will name 10 items in a simple category with occasional min A over 2 sessions    Time 3    Period Weeks    Status New      SLP SHORT TERM GOAL #2   Title Pt will write a word or word approximation to compensate for verbal expression with rare min A for accuracy in 2 sessions    Time 3    Period Weeks    Status New      SLP SHORT TERM GOAL #3   Title pt will complete a communication-related quality of life measure in the first therapy session    Time 1    Period Weeks    Status New  SLP SHORT TERM GOAL #5   Time 3    Period Weeks    Status New            SLP Long Term Goals - 10/11/20 0859      SLP LONG TERM GOAL #1   Title Pt will successfully write a word or word approximation to compensate for verbal expression in 2 sessions    Time 6    Period Weeks    Status New      SLP LONG TERM GOAL #2   Title pt will particpate in 8 minutes simple conversation with compensations for verbal expression in 3 sessions    Time 6    Period Weeks    Status New      SLP LONG TERM GOAL #3   Title Pt will report using appropriate gesture or short phrase to indicate to family in a moment of difficulty that rushing her speech is not helpful to her, 3 times   as a response to son's request to "spit it out"   Time 6    Period Weeks    Status New      SLP LONG TERM GOAL #4   Title pt will indicate a greater quality of life inthe last 2 sessions, compared to session #1    Time 6    Period Weeks    Status New             Patient will benefit from skilled therapeutic intervention in order to improve the following deficits and impairments:   Aphasia    Problem List Patient Active Problem List   Diagnosis Date Noted  . Bilateral high frequency sensorineural hearing loss 02/25/2019  . Non compliance with medical treatment 03/23/2018  . History of tobacco use  disorder 01/01/2018  . Syncope and collapse 01/01/2018  . 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 Acute right hemiparesis (HCC) 12/12/2017  . History of completed stroke 12/12/2017  . Leukocytosis 12/12/2017  . Hypokalemia 12/12/2017  . Hypotension   . 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 Poor compliance with CPAP treatment 03/04/2017  . OSA and COPD overlap syndrome (HCC) 03/04/2017  . Simple chronic bronchitis (HCC) 03/04/2017  . MCI (mild cognitive impairment) 11/27/2016  . Left sided lacunar infarction (HCC) 03/11/2014  . Cerebrovascular small vessel disease 03/11/2014  . Insomnia, idiopathic 12/20/2013  . Snoring 12/06/2013  . Essential hypertension, benign 10/26/2012  . Hyperlipidemia LDL goal <70 10/26/2012  . Polypharmacy 10/26/2012  . DM (diabetes mellitus) (HCC) 10/13/2011  . Tremor 10/13/2011  . GERD (gastroesophageal reflux disease) 10/13/2011  . Depression 10/13/2011    [facility] ,MS, CCC-SLP  10/11/2020, 9:11 AM  [facility] [address] [place], [place], [address] Phone: [phone]   Fax:  [phone]  Name: Brittany [name] MRN: [id number] Date of Birth: Nov 01, 1960

## 2020-10-13 ENCOUNTER — Other Ambulatory Visit: Payer: Self-pay

## 2020-10-13 ENCOUNTER — Ambulatory Visit: Payer: Medicare PPO | Admitting: Speech Pathology

## 2020-10-13 ENCOUNTER — Ambulatory Visit: Payer: Medicare PPO | Admitting: Occupational Therapy

## 2020-10-13 DIAGNOSIS — R278 Other lack of coordination: Secondary | ICD-10-CM

## 2020-10-13 DIAGNOSIS — M6281 Muscle weakness (generalized): Secondary | ICD-10-CM

## 2020-10-13 DIAGNOSIS — R251 Tremor, unspecified: Secondary | ICD-10-CM

## 2020-10-13 DIAGNOSIS — R4701 Aphasia: Secondary | ICD-10-CM | POA: Diagnosis not present

## 2020-10-13 NOTE — Patient Instructions (Signed)
Wall Push-Up: Double Arm    Stand _slightly back from wall with both hands on wall. Perform a push-up. Repeat 10___ times per set. . Do _1__ sets per session, 5 days per week  http://plyo.exer.us/182   Copyright  VHI. All rights reserved.

## 2020-10-13 NOTE — Therapy (Signed)
[facility] [address] [place], [place], [address] Phone: [phone]   Fax:  [phone]  Occupational Therapy Treatment  Patient Details  Name: Brittany [name] MRN: [id number] Date of Birth: 12/02/1960 Referring Provider (OT): Maximiano Coss, NP   Encounter Date: 10/13/2020   OT End of Session - 10/13/20 1155    Visit Number 8    Number of Visits 11    Date for OT Re-Evaluation 10/11/20    Authorization Type [employer]--needs Auth    Authorization Time Period cert. date 09/11/20-12/08/20 week 5/5 - 11/03/20    Authorization - Visit Number 8    Authorization - Number of Visits 10    Progress Note Due on Visit 10    OT Start Time 1152    OT Stop Time 1220    OT Time Calculation (min) 28 min    Activity Tolerance Patient tolerated treatment well    Behavior During Therapy WFL for tasks assessed/performed           Past Medical History:  Diagnosis Date  . Asthma   . Cataract    Phreesia 10/03/2020  . Chronic lower back pain   . Depression   . Depression    Phreesia 10/03/2020  . Diabetes mellitus without complication ([place])    Phreesia 10/03/2020  . Family history of adverse reaction to anesthesia    Mother has trouble coming out of anesthesia; patient reports she has no problems  . Fibromyalgia   . High cholesterol   . Hypertension    Phreesia 10/03/2020  . Presence of permanent cardiac pacemaker   . Seizures ([place]) 11/2017   "day after she came home from hospital after having stroke" (01/01/2018)  . Stroke [facility]) 12/06/2017   "speech issues since" (01/01/2018)  . Type II diabetes mellitus ([place])     Past Surgical History:  Procedure Laterality Date  . CHOLECYSTECTOMY    . EYE SURGERY N/A    Phreesia 10/03/2020  . GAS/FLUID EXCHANGE Right 12/12/2019   Procedure: Gas/Fluid Exchange;  Surgeon: Jalene Mullet, MD;  Location: [place];  Service: Ophthalmology;  Laterality: Right;  . INSERT / REPLACE /  REMOVE PACEMAKER  01/01/2018  . IR CT HEAD LTD  12/06/2017  . IR PERCUTANEOUS ART THROMBECTOMY/INFUSION INTRACRANIAL INC DIAG ANGIO  12/06/2017  . LOOP RECORDER INSERTION N/A 12/12/2017   Procedure: LOOP RECORDER INSERTION;  Surgeon: Constance Haw, MD;  Location: [place] CV LAB;  Service: Cardiovascular;  Laterality: N/A;  . LOOP RECORDER REMOVAL  01/01/2018  . LOOP RECORDER REMOVAL N/A 01/01/2018   Procedure: LOOP RECORDER REMOVAL;  Surgeon: Constance Haw, MD;  Location: [place] CV LAB;  Service: Cardiovascular;  Laterality: N/A;  . NECK SURGERY    . PACEMAKER IMPLANT N/A 01/01/2018   Procedure: PACEMAKER IMPLANT;  Surgeon: Constance Haw, MD;  Location: [place] CV LAB;  Service: Cardiovascular;  Laterality: N/A;  . PHOTOCOAGULATION Right 12/12/2019   Procedure: Photocoagulation;  Surgeon: Jalene Mullet, MD;  Location: [place];  Service: Ophthalmology;  Laterality: Right;  . RADIOLOGY WITH ANESTHESIA N/A 12/06/2017   Procedure: CODE STROKE;  Surgeon: Luanne Bras, MD;  Location: [place];  Service: Radiology;  Laterality: N/A;  . SCLERAL BUCKLE WITH POSSIBLE 25 GAUGE PARS PLANA VITRECTOMY Right 12/12/2019   Procedure: SCLERAL BUCKLE WITH  PARS PLANA VITRECTOMY RETINAL DETACHMENT REPAIR;  Surgeon: Jalene Mullet, MD;  Location: [place];  Service: Ophthalmology;  Laterality: Right;  . TEE WITHOUT CARDIOVERSION N/A 12/10/2017  Procedure: TRANSESOPHAGEAL ECHOCARDIOGRAM (TEE);  Surgeon: Jerline Pain, MD;  Location: [facility] ENDOSCOPY;  Service: Cardiovascular;  Laterality: N/A;  . THROMBECTOMY FEMORAL ARTERY Right 12/07/2017   Procedure: THROMBECTOMY RIGHT FEMORAL ARTERY;  Surgeon: Waynetta Sandy, MD;  Location: [place];  Service: Vascular;  Laterality: Right;  . TONSILLECTOMY      There were no vitals filed for this visit.   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 No/denies                      Treatment: checked remaining goals, and reviewed HEP. Arm bike x 6 mins level 3 for conditioning          OT Education - 10/13/20 1217    Education Details reveiwed HEP prone on elbows, lifting chest then reach with alternate UE's, then clamshell sidelying, and standing at sink rocking forwards and backwards, added wall pushups to HEP see pt instructions    Person(s) Educated Patient    Methods Explanation;Demonstration;Verbal cues;Handout    Comprehension Verbalized understanding;Returned demonstration;Verbal cues required               OT Long Term Goals - 10/13/20 1209      OT LONG TERM GOAL #1   Title Pt will be independent with HEP for coordination.    Time 5    Period Weeks    Status Achieved      OT LONG TERM GOAL #2   Title Pt will be indpendent with R shoulder strengthening HEP (including wt. bearing).    Time 5    Period Weeks    Status Achieved   met for prone exercises and modified quadraped rock     OT LONG TERM GOAL #3   Title Pt will verbalize understanding of compensation strategies/AE for tremors during ADLs/IADLs (including writing, eating, using computer, and crafts).    Time 5    Period Weeks    Status Achieved      OT LONG TERM GOAL #4   Title Pt will improve R hand coordination for ADLs as shown by improving time on 9-hole peg test by at least 5 sec.    Baseline 52sec.    Time 5    Period Weeks    Status Achieved   45.85     OT LONG TERM GOAL #5   Title Pt will verbalize understanding of craft-type activies that are appropriate and strategies to participate.    Time 5    Period Weeks     Status Achieved                 Plan - 10/13/20 1218    Clinical Impression Statement Pt met all goals. She agrees with plans for d/c today.    OT Occupational Profile and History Detailed Assessment- Review of Records and additional review of physical, cognitive, psychosocial history related to current functional performance    Occupational performance deficits (Please refer to evaluation for details): ADL's;IADL's;Leisure    Body Structure / Function / Physical Skills ADL;Strength;Dexterity;UE functional use;IADL;Sensation;Coordination;FMC    Rehab  Potential Fair    Clinical Decision Making Several treatment options, min-mod task modification necessary    Comorbidities Affecting Occupational Performance: May have comorbidities impacting occupational performance    Modification or Assistance to Complete Evaluation  Min-Moderate modification of tasks or assist with assess necessary to complete eval    OT Frequency 2x / week    OT Duration --   5 weeks   OT Treatment/Interventions Self-care/ADL training;DME and/or AE instruction;Therapeutic activities;Therapeutic exercise;Neuromuscular education;Patient/family education    Plan discuss plans to renew vs d/c (likely d/c, however, pt unsure and tearful today), check remaining goals and additional strategies for ADLs prn, ?consider low range theraband    Recommended Other Services speech therapy--pt reports speech decline    Consulted and Agree with Plan of Care Patient           Patient will benefit from skilled therapeutic intervention in order to improve the following deficits and impairments:   Body Structure / Function / Physical Skills: ADL,Strength,Dexterity,UE functional use,IADL,Sensation,Coordination,FMC       Visit Diagnosis: Other lack of coordination  Tremor  Muscle weakness (generalized)   OCCUPATIONAL THERAPY DISCHARGE SUMMARY   Current functional level related to goals / functional outcomes: Pt met all  goals.   Remaining deficits: Decreased strenght, decreased coordination, tremor   Education / Equipment: Pt was educated regarding adapted strategies for ADLS/ IADLS and HEP, she verbalized understanding.  Plan: Patient agrees to discharge.  Patient goals were met. Patient is being discharged due to meeting the stated rehab goals.  ?????      Problem List Patient Active Problem List   Diagnosis Date Noted  . Bilateral high frequency sensorineural hearing loss 02/25/2019  . Non compliance with medical treatment 03/23/2018  . History of tobacco use disorder 01/01/2018  . Syncope and collapse 01/01/2018  . 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 cerebrovascular accident (CVA) ([place]) 12/12/2017  . Acute right hemiparesis ([place]) 12/12/2017  . History of completed stroke 12/12/2017  . Leukocytosis 12/12/2017  . Hypokalemia 12/12/2017  . Hypotension   . 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 Poor compliance with CPAP treatment 03/04/2017  . OSA and COPD overlap syndrome ([place]) 03/04/2017  . Simple chronic bronchitis ([place]) 03/04/2017  . MCI (mild cognitive impairment) 11/27/2016  . Left sided lacunar infarction ([place]) 03/11/2014  . Cerebrovascular small vessel disease 03/11/2014  . Insomnia, idiopathic 12/20/2013  . Snoring 12/06/2013  . Essential hypertension, benign 10/26/2012  . Hyperlipidemia LDL goal <70 10/26/2012  . Polypharmacy 10/26/2012  . DM (diabetes mellitus) ([place]) 10/13/2011  . Tremor 10/13/2011  . GERD (gastroesophageal reflux disease) 10/13/2011  . Depression 10/13/2011    Ruby Logiudice 10/13/2020, 2:26 PM Theone Murdoch, OTR/L [phone] Phone: [phone] 2:28 PM 10/13/20 [facility] [address] [place], [place], [address] Phone: [phone]   Fax:  [phone]  Name: Brittany [name] MRN: [id number] Date of Birth: [birth year]

## 2020-10-14 NOTE — Progress Notes (Signed)
Remote pacemaker transmission.   

## 2020-10-16 ENCOUNTER — Encounter: Payer: Self-pay | Admitting: Adult Health

## 2020-10-16 ENCOUNTER — Ambulatory Visit (INDEPENDENT_AMBULATORY_CARE_PROVIDER_SITE_OTHER): Payer: Medicare PPO | Admitting: Adult Health

## 2020-10-16 VITALS — BP 128/79 | HR 92 | Ht 63.0 in | Wt 180.6 lb

## 2020-10-16 DIAGNOSIS — R569 Unspecified convulsions: Secondary | ICD-10-CM | POA: Diagnosis not present

## 2020-10-16 DIAGNOSIS — F4323 Adjustment disorder with mixed anxiety and depressed mood: Secondary | ICD-10-CM

## 2020-10-16 DIAGNOSIS — Z0289 Encounter for other administrative examinations: Secondary | ICD-10-CM

## 2020-10-16 DIAGNOSIS — I639 Cerebral infarction, unspecified: Secondary | ICD-10-CM

## 2020-10-16 DIAGNOSIS — G250 Essential tremor: Secondary | ICD-10-CM

## 2020-10-16 DIAGNOSIS — I6930 Unspecified sequelae of cerebral infarction: Secondary | ICD-10-CM | POA: Diagnosis not present

## 2020-10-16 MED ORDER — LEVETIRACETAM 500 MG PO TABS: 500.0000 mg | ORAL_TABLET | Freq: Two times a day (BID) | ORAL | 3 refills | Status: DC

## 2020-10-16 MED ORDER — LORAZEPAM 0.5 MG PO TABS: 0.2500 mg | ORAL_TABLET | Freq: Two times a day (BID) | ORAL | 0 refills | Status: AC | PRN

## 2020-10-16 NOTE — Patient Instructions (Signed)
Continue primidone 250 mg daily for tremors Also recommend use of ativan prior to social situations which may cause worsening of your tremors (refil provided)  Continue sertaline 200mg  nightly for depression/anxiety  Continue keppra 500mg  twice daily for seizure prevention   Continue clopidogrel 75 mg daily  and Crestor  for secondary stroke prevention  Continue to follow up with PCP regarding cholesterol, blood pressure and diabetes management  Maintain strict control of hypertension with blood pressure goal below 130/90, diabetes with hemoglobin A1c goal below 7% and cholesterol with LDL cholesterol (bad cholesterol) goal below 70 mg/dL.       Followup in the future with me in 6 months or call earlier if needed       Thank you for coming to see at [facility]. I hope we have been able to provide you high quality care today.  You may receive a patient satisfaction survey over the next few weeks. We would appreciate your feedback and comments so that we may continue to improve ourselves and the health of our patients.

## 2020-10-16 NOTE — Progress Notes (Signed)
[facility] [address] [place]. [place] [address] [phone]       FOLLOW-UP NOTE  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 -Residual expressive aphasia and cognitive impairment- recently restarting working with SLP -Remains on clopidogrel with mild bruising but no bleeding -Remains on Crestor 20 mg daily per PCP -recent lipid panel showed LDL 75 -Continues to follow with endocrinology for DM management with recent A1c 7.2 -Blood pressure today 128/79 - does not routinely monitor at home as typically stable  Post stroke seizure -No recurrent episodes -Continues on Keppra 500 mg twice daily tolerating without side effects  Mild cognitive impairment -Post stroke - stable per patient report -MMSE today 29/30 (prior 26/30)   Depression/anxiety Adjustment disorder -Overall stable with ongoing use of sertraline 200 mg nightly (per PCP).  Has not recently used Ativan -has been seeing psychology with benefit   Essential tremors -worked with OT with some improvement  -Continues on primidone 250 mg daily tolerating well without side effects -Does report worsening with increased anxiety and social situations       ROS:   14 system review of systems is positive for those listed in HPI and all other systems negative   PMH:  Past Medical History:  Diagnosis Date  . Asthma   . Cataract    Phreesia 10/03/2020  . Chronic lower back pain   . Depression   . Depression    Phreesia 10/03/2020  . Diabetes mellitus without complication ([place])    Phreesia 10/03/2020  . Family history of adverse reaction to anesthesia    Mother has trouble coming out of anesthesia; patient reports she has no problems  . Fibromyalgia   . High cholesterol   .  Hypertension    Phreesia 10/03/2020  . Presence of permanent cardiac pacemaker   . Seizures ([place]) 11/2017   "day after she came home from hospital after having stroke" (01/01/2018)  . Stroke [facility]) 12/06/2017   "speech issues since" (01/01/2018)  . Type II diabetes mellitus ([place])     Social History:  Social History   Socioeconomic History  . Marital status: Married    Spouse name: [name]  . Number of children: 2  . Years of education: 41  . Highest education level: Not on file  Occupational History  . Not on file  Tobacco Use  . Smoking status: Current Every Day Smoker    Packs/day: 1.00    Years: 38.00    Pack years: 38.00    Types: Cigarettes  . Smokeless tobacco: Never Used  Vaping Use  . Vaping Use: Never used  Substance and Sexual Activity  . Alcohol use: Yes    Comment: 01/01/2018 "nothing since stroke 11/2017"  . Drug use: No  . Sexual activity: Not Currently  Other Topics Concern  . Not on file  Social History Narrative   Patient is married (Brittany [name]) and lives at home with her family   Patient has two children.   Patient works at [employer].   Patient has a college education.   Patient is right-handed.   Patient drinks 3 cups of coffee M-F.      Social Determinants of Health   Financial Resource Strain: Not on file  Food Insecurity: Not on file  Transportation Needs: Not on file  Physical Activity: Not on file  Stress: Not on file  Social Connections: Not on file  Intimate Partner Violence: Not on file    Medications:   Current Outpatient Medications on File Prior to Visit  Medication Sig Dispense Refill  . Accu-Chek Softclix Lancets lancets 1 each by Other route 4 (four) times daily. E11.9 100 each 12  . acetaminophen (TYLENOL) 500 MG tablet Take 1,000 mg by mouth every 6 (six) hours as needed for fever (pain).    . B Complex-Biotin-FA (MULTI-B COMPLEX) CAPS Take one a day ,B complex  multivitamin over the counter or use prenatal vitamin. 90  capsule 0  . BD PEN NEEDLE NANO 2ND GEN 32G X 4 MM MISC USE FOUR TIMES DAILY 400 each 3  . Blood Glucose Monitoring Suppl (ACCU-CHEK GUIDE ME) w/Device KIT 1 each by Does not apply route 4 (four) times daily. E11.9 1 kit 0  . clopidogrel (PLAVIX) 75 MG tablet Take 1 tablet (75 mg total) by mouth daily. 90 tablet 3  . glucose blood (ACCU-CHEK GUIDE) test strip 1 each by Other route 4 (four) times daily. E11.9 100 each 12  . insulin aspart (NOVOLOG FLEXPEN) 100 UNIT/ML FlexPen 3 times a day (just before each meal), 04-19-09, and pen needles 4/day 15 mL 11  . insulin detemir (LEVEMIR FLEXTOUCH) 100 UNIT/ML FlexPen Inject 20 Units into the skin at bedtime. 30 mL 3  . irbesartan (AVAPRO) 150 MG tablet TAKE 1 TABLET(150 MG) BY MOUTH DAILY 90 tablet 3  . primidone (MYSOLINE) 250 MG tablet TAKE 1 TABLET(250 MG) BY MOUTH DAILY 30 tablet 6  . rosuvastatin (CRESTOR) 20 MG tablet Take 1 tablet (20 mg total) by mouth daily. 90 tablet 3  . sertraline (ZOLOFT) 100 MG tablet TAKE 2 TABLETS(200 MG) BY MOUTH AT BEDTIME 180 tablet 1   No current facility-administered medications on file prior to visit.    Allergies:  No Known Allergies  Today's Vitals   10/16/20 1055  BP: 128/79  Pulse: 92  Weight: 180 lb 9.6 oz (81.9 kg)  Height: 5' 3"  (1.6 m)   Body mass index is 31.99 kg/m.   General: well developed, well nourished,  pleasant middle-age Caucasian female, seated, in no evident distress Head: head normocephalic and atraumatic.   Neck: supple with no carotid or supraclavicular bruits Cardiovascular: regular rate and rhythm, no murmurs Musculoskeletal: no deformity Skin:  no rash/petichiae Vascular:  Normal pulses all extremities   Neurologic Exam Mental Status: Awake and fully alert.  Mild to moderate expressive aphasia with word hesitancy.  Oriented to place and time. Recent memory impaired and remote memory intact. Attention span, concentration and fund of knowledge appropriate today. Mood and  affect appropriate throughout visit  MMSE - Mini Mental State Exam 10/16/2020 03/27/2020 09/27/2019  Orientation to time 5 5 5   Orientation to Place 5 5 4   Registration 3 3 3   Attention/ Calculation 5 4 0  Recall 2 2 2   Language- name 2 objects 2 2 2   Language- repeat 1 0 0  Language- follow 3 step command 3 3 3   Language- read & follow direction 1 0 1  Write a sentence 1 1 1   Copy design 1 1 1   Copy design-comments Named 13 animals. 8 animals named 6 animals.  Total score 29 26 22    Cranial Nerves: Pupils equal, briskly reactive to light. Extraocular movements full without nystagmus. Visual fields full to confrontation. Hearing intact. Facial sensation intact. Face,  tongue, palate moves normally and symmetrically.  Motor: Normal bulk and tone. Normal strength in all tested extremity muscles.  Sensory.: intact to touch , pinprick , position and vibratory sensation.  Coordination: Mild tremor head and upper arms R>L with outstretched arms and amplified RUE finger-to-nose testing.  No evidence of lower extremity tremor or resting tremor.  No evidence of cogwheel rigidity. Gait and Station: Arises from chair without difficulty. Stance is normal. Gait demonstrates normal stride length and balance without use of assistive device Reflexes: 1+ and symmetric. Toes downgoing.      ASSESSMENT/PLAN: 60 year old lady with aphasia secondary to left MCA infarct in April 2019 of cryptogenic etiology treated with IV TPA and mechanical thrombectomy with good recanalization. She also had symptomatic seizure after discharge and residual stroke deficit of expressive aphasia and cognitive impairment currently receiving long-term disability.  Restarted on primidone due to essential tremors which has been stable.  Great difficulty with poststroke depression/anxiety and adjustment disorder which has since stabilized   Left MCA stroke -Residual expressive aphasia and cognitive impairment -currently restarted  working with neuro rehab SLP -Continue Plavix and Crestor for secondary stroke prevention -Close PCP follow-up for aggressive stroke risk factor management including HTN, HLD and DM -HLD: LDL goal<70.  Recent LDL 75 (09/2020).  On Crestor 20 mg daily per PCP -HTN: BP goal <130/90. Stable today. Continue current regimen and routine follow-up with PCP -DM: A1c goal<7.0.  Recent A1c 7.2 (09/2020).  Recent adjustments made by PCP.  Continue to follow PCP for monitoring management  Post stroke seizure -Continue Keppra 500 mg twice daily for seizure prophylaxis -refills provided -No recurrent seizure episodes.  Advised to call with any seizure symptoms or events  Essential tremors -Continue primidone 250 mg daily -refill provided -Worse with increased anxiety and social situations - recommend trialing low-dose Ativan (as already prescribed and tolerates without side effects) prior to social events or increased stressful situation for possible benefit.  May also consider use propranolol if indicated in future as this may benefit for tremors and anxiety  Depression/anxiety disorder Adjustment disorder -Continue Zoloft 200 mg daily (managed by PCP) and Ativan as needed (refill provided) -Continue working with psychology for ongoing benefit    Follow-up in 6 months months or call earlier if needed   CC:  [place] provider: Dr. Suzi Roots, Ranell Patrick, MD     I spent 35 minutes of face-to-face and non-face-to-face time with patient.  This included previsit chart review, lab review, study review, order entry, electronic health record documentation, patient education regarding prior stroke, residual deficits, importance of managing stroke risk factors, essential tremors with worsening with anxiety, depression with adjustment disorder and answered all other questions to patient satisfaction  Frann Rider, AGNP-BC  [facility] [address] [place], [place]  [address]  Phone [phone] Fax [phone] Note: This document was prepared with digital dictation and possible smart phrase technology. Any transcriptional errors that result from this process are unintentional.

## 2020-10-17 ENCOUNTER — Ambulatory Visit: Payer: Medicare PPO | Admitting: Occupational Therapy

## 2020-10-17 ENCOUNTER — Encounter: Payer: Self-pay | Admitting: Family Medicine

## 2020-10-17 NOTE — Progress Notes (Signed)
I agree with the above plan 

## 2020-10-19 ENCOUNTER — Other Ambulatory Visit: Payer: Self-pay

## 2020-10-19 ENCOUNTER — Encounter: Payer: Medicare PPO | Admitting: Occupational Therapy

## 2020-10-19 ENCOUNTER — Telehealth: Payer: Self-pay | Admitting: *Deleted

## 2020-10-19 ENCOUNTER — Ambulatory Visit: Payer: Medicare PPO

## 2020-10-19 DIAGNOSIS — R4701 Aphasia: Secondary | ICD-10-CM | POA: Diagnosis not present

## 2020-10-19 DIAGNOSIS — R251 Tremor, unspecified: Secondary | ICD-10-CM | POA: Diagnosis not present

## 2020-10-19 DIAGNOSIS — R278 Other lack of coordination: Secondary | ICD-10-CM | POA: Diagnosis not present

## 2020-10-19 DIAGNOSIS — M6281 Muscle weakness (generalized): Secondary | ICD-10-CM | POA: Diagnosis not present

## 2020-10-19 NOTE — Telephone Encounter (Signed)
Received forms [employer], completed, to JM/NP for review completion and signature.

## 2020-10-19 NOTE — Telephone Encounter (Signed)
To MR. 

## 2020-10-19 NOTE — Therapy (Signed)
[facility] [address] [place], [place], [address] Phone: [phone]   Fax:  [phone]  Speech Language Pathology Treatment  Patient Details  Name: Brittany [name] MRN: [id number] Date of Birth: 1961/04/24 Referring Provider (SLP): Janeece Agee, NP   Encounter Date: 10/19/2020   End of Session - 10/19/20 1624    Visit Number 2    Number of Visits 13    Date for SLP Re-Evaluation 01/05/21    SLP Start Time 1530    SLP Stop Time  1615    SLP Time Calculation (min) 45 min    Activity Tolerance Patient tolerated treatment well           Past Medical History:  Diagnosis Date  . Asthma   . Cataract    Phreesia 10/03/2020  . Chronic lower back pain   . Depression   . Depression    Phreesia 10/03/2020  . Diabetes mellitus without complication ([facility])    Phreesia 10/03/2020  . Family history of adverse reaction to anesthesia    Mother has trouble coming out of anesthesia; patient reports she has no problems  . Fibromyalgia   . High cholesterol   . Hypertension    Phreesia 10/03/2020  . Presence of permanent cardiac pacemaker   . Seizures ([facility]) 11/2017   "day after she came home from hospital after having stroke" (01/01/2018)  . Stroke [facility]) 12/06/2017   "speech issues since" (01/01/2018)  . Type II diabetes mellitus (H[facility])     Past Surgical History:  Procedure Laterality Date  . CHOLECYSTECTOMY    . EYE SURGERY N/A    Phreesia 10/03/2020  . GAS/FLUID EXCHANGE Right 12/12/2019   Procedure: Gas/Fluid Exchange;  Surgeon: Carmela Rima, MD;  Location: [facility] OR;  Service: Ophthalmology;  Laterality: Right;  . INSERT / REPLACE / REMOVE PACEMAKER  01/01/2018  . IR CT HEAD LTD  12/06/2017  . IR PERCUTANEOUS ART THROMBECTOMY/INFUSION INTRACRANIAL INC DIAG ANGIO  12/06/2017  . LOOP RECORDER INSERTION N/A 12/12/2017   Procedure: LOOP RECORDER INSERTION;  Surgeon: Regan Lemming, MD;  Location: [facility] INVASIVE CV  LAB;  Service: Cardiovascular;  Laterality: N/A;  . LOOP RECORDER REMOVAL  01/01/2018  . LOOP RECORDER REMOVAL N/A 01/01/2018   Procedure: LOOP RECORDER REMOVAL;  Surgeon: Regan Lemming, MD;  Location: [facility] INVASIVE CV LAB;  Service: Cardiovascular;  Laterality: N/A;  . NECK SURGERY    . PACEMAKER IMPLANT N/A 01/01/2018   Procedure: PACEMAKER IMPLANT;  Surgeon: Regan Lemming, MD;  Location: [facility] INVASIVE CV LAB;  Service: Cardiovascular;  Laterality: N/A;  . PHOTOCOAGULATION Right 12/12/2019   Procedure: Photocoagulation;  Surgeon: Carmela Rima, MD;  Location: [facility] OR;  Service: Ophthalmology;  Laterality: Right;  . RADIOLOGY WITH ANESTHESIA N/A 12/06/2017   Procedure: CODE STROKE;  Surgeon: Julieanne Cotton, MD;  Location: [facility] OR;  Service: Radiology;  Laterality: N/A;  . SCLERAL BUCKLE WITH POSSIBLE 25 GAUGE PARS PLANA VITRECTOMY Right 12/12/2019   Procedure: SCLERAL BUCKLE WITH  PARS PLANA VITRECTOMY RETINAL DETACHMENT REPAIR;  Surgeon: Carmela Rima, MD;  Location: [facility] OR;  Service: Ophthalmology;  Laterality: Right;  . TEE WITHOUT CARDIOVERSION N/A 12/10/2017   Procedure: TRANSESOPHAGEAL ECHOCARDIOGRAM (TEE);  Surgeon: Jake Bathe, MD;  Location: [facility] ENDOSCOPY;  Service: Cardiovascular;  Laterality: N/A;  . THROMBECTOMY FEMORAL ARTERY Right 12/07/2017   Procedure: THROMBECTOMY RIGHT FEMORAL ARTERY;  Surgeon: Maeola Harman, MD;  Location: [facility] OR;  Service: Vascular;  Laterality: Right;  . TONSILLECTOMY  There were no vitals filed for this visit.   Subjective Assessment - 10/19/20 1620    Subjective "I wanted to ask if it needed to be in the re-re-refrigerator but I couldn't get the word out at that time" re: asking questions at pharmacy                 ADULT SLP TREATMENT - 10/19/20 1543      General Information   Behavior/Cognition Alert;Cooperative;Pleasant mood      Treatment Provided   Treatment provided Cognitive-Linquistic       Cognitive-Linquistic Treatment   Treatment focused on Aphasia;Patient/family/caregiver education    Skilled Treatment Pt provided example of word finding at pharmacy earlier this week (only able to verbalize "re" for refrigerator). SLP completed communication effective rating scales (Communication-Short Form: 14 & Communication Effectiveness Survery:17). Pt reports she used to enjoy Way[employer] Cera books, but unable to comprehend s/p CVA. Intermittent anomia noted in conversation, in which pt able to correct with circumloction, descriptions, and gestures with occasional min A. SLP educated pt on compensatory strategies to target, including writing word or word approximations. Pt able to name 7/10 words in functional catogory (grocery list), with pt able to effectively use written words for 2/3 opportunities with min A. Written language and reading are relative strengths.      Assessment / Recommendations / Plan   Plan Continue with current plan of care      Progression Toward Goals   Progression toward goals Progressing toward goals            SLP Education - 10/19/20 1618    Education Details Compensations, environmental modifications, multimodal communication    Person(s) Educated Patient    Methods Explanation;Demonstration;Verbal cues;Handout    Comprehension Verbalized understanding;Returned demonstration;Verbal cues required;Need further instruction            SLP Short Term Goals - 10/19/20 1630      SLP SHORT TERM GOAL #1   Title Pt will name 10 items in a simple category with occasional min A over 2 sessions    Baseline 10-19-20    Time 3    Period Weeks    Status On-going      SLP SHORT TERM GOAL #2   Title Pt will write a word or word approximation to compensate for verbal expression with rare min A for accuracy in 2 sessions    Time 3    Period Weeks    Status On-going      SLP SHORT TERM GOAL #3   Title pt will complete a communication-related quality of life  measure in the first therapy session    Status Achieved      SLP SHORT TERM GOAL #4   Time 3    Status On-going      SLP SHORT TERM GOAL #5   Time 3    Period Weeks    Status On-going            SLP Long Term Goals - 10/19/20 1632      SLP LONG TERM GOAL #1   Title Pt will successfully write a word or word approximation to compensate for verbal expression in 2 sessions    Time 6    Period Weeks    Status On-going      SLP LONG TERM GOAL #2   Title pt will particpate in 8 minutes simple conversation with compensations for verbal expression in 3 sessions    Time 6  Period Weeks    Status On-going      SLP LONG TERM GOAL #3   Title Pt will report using appropriate gesture or short phrase to indicate to family in a moment of difficulty that rushing her speech is not helpful to her, 3 times   as a response to son's request to "spit it out"   Time 6    Period Weeks    Status On-going      SLP LONG TERM GOAL #4   Title pt will indicate a greater quality of life inthe last 2 sessions, compared to session #1    Time 6    Period Weeks    Status On-going            Plan - 10/19/20 1625    Clinical Impression Statement Aki presents with mild to moderate expressive aphasia as evidenced by word finding deficits. Pt able to intermittent self-correct anomia, with SLP providing education and training of compensations to improve carryover and minimize communication breakdown negatively impacting her daily routine. Reading and writing appear to be relative strengths at this time. Skilled ST intervention recommended to address aphasia to enhance QOL and communication effectiveness.    Speech Therapy Frequency 2x / week    Duration --   6 weeks or 13 total visits   Treatment/Interventions Compensatory strategies;Functional tasks;Environmental controls;Multimodal communcation approach;SLP instruction and feedback    Potential to Achieve Goals Good    Potential Considerations  Previous level of function;Family/community support    SLP Home Exercise Plan Compensations    Consulted and Agree with Plan of Care Patient           Patient will benefit from skilled therapeutic intervention in order to improve the following deficits and impairments:   Aphasia    Problem List Patient Active Problem List   Diagnosis Date Noted  . Bilateral high frequency sensorineural hearing loss 02/25/2019  . Non compliance with medical treatment 03/23/2018  . History of tobacco use disorder 01/01/2018  . Syncope and collapse 01/01/2018  . 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 Acute right hemiparesis ([facility]CC) 12/12/2017  . History of completed stroke 12/12/2017  . Leukocytosis 12/12/2017  . Hypokalemia 12/12/2017  . Hypotension   . 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 Poor compliance with CPAP treatment 03/04/2017  . OSA and COPD overlap syndrome (HCC) 03/04/2017  . Simple chronic bronchitis (HCC) 03/04/2017  . MCI (mild cognitive impairment) 11/27/2016  . Left sided lacunar infarction (HCC) 03/11/2014  . Cerebrovascular small vessel disease 03/11/2014  . Insomnia, idiopathic 12/20/2013  . Snoring 12/06/2013  . Essential hypertension, benign 10/26/2012  . Hyperlipidemia LDL goal <70 10/26/2012  . Polypharmacy 10/26/2012  . DM (diabetes mellitus) (HCC) 10/13/2011  . Tremor 10/13/2011  . GERD (gastroesophageal reflux disease) 10/13/2011  . Depression 10/13/2011    Janann Colonel, MA CCC-SLP 10/19/2020, 4:35 PM  [facility] [address] [place], [place], [address] Phone: [phone]   Fax:  [phone]   Name: Brittany [name] MRN: [id number] Date of Birth: 24-Apr-1961

## 2020-10-19 NOTE — Patient Instructions (Signed)
Tips for Talking with People who have Aphasia  . Say one thing at a time . Don't  rush - slow down, be patient . Talk face to face . Reduce background noise . Relax - be natural . Use pen and paper . Write down key words . Draw diagrams or pictures . Don't pretend you understand . Ask what helps . Recap - check you both understand . Be a partner, not a therapist   Aphasia does not affect intelligence, only language. The person with aphasia can still: make decisions, have opinions, and socialize.   Describing words  What group does it belong to?  What do I use it for?  Where can I find it?  What does it LOOK like?  What other words go with it?  What is the 1st sound of the word?   Many Ways to Communicate  Describe it Write it Draw it Gesture it Use related words   

## 2020-10-19 NOTE — Telephone Encounter (Signed)
Completed and placed in out box.  Thank you. 

## 2020-10-23 ENCOUNTER — Other Ambulatory Visit: Payer: Self-pay

## 2020-10-23 ENCOUNTER — Encounter: Payer: Medicare PPO | Admitting: Occupational Therapy

## 2020-10-23 ENCOUNTER — Ambulatory Visit: Payer: Medicare PPO | Admitting: Psychologist

## 2020-10-23 ENCOUNTER — Ambulatory Visit: Payer: Medicare PPO

## 2020-10-23 DIAGNOSIS — R4701 Aphasia: Secondary | ICD-10-CM

## 2020-10-23 DIAGNOSIS — R278 Other lack of coordination: Secondary | ICD-10-CM | POA: Diagnosis not present

## 2020-10-23 DIAGNOSIS — R251 Tremor, unspecified: Secondary | ICD-10-CM | POA: Diagnosis not present

## 2020-10-23 DIAGNOSIS — M6281 Muscle weakness (generalized): Secondary | ICD-10-CM | POA: Diagnosis not present

## 2020-10-23 NOTE — Therapy (Signed)
[facility] [address] [place], [place], [address] Phone: [phone]   Fax:  [phone]  Speech Language Pathology Treatment  Patient Details  Name: TALMA AGUILLARD MRN: [id number] Date of Birth: 1960-11-30 Referring Provider (SLP): Janeece Agee, NP   Encounter Date: 10/23/2020   End of Session - 10/23/20 1454    Visit Number 3    Number of Visits 13    Date for SLP Re-Evaluation 01/05/21    SLP Start Time 1400    SLP Stop Time  1447    SLP Time Calculation (min) 47 min    Activity Tolerance Patient tolerated treatment well           Past Medical History:  Diagnosis Date  . Asthma   . Cataract    Phreesia 10/03/2020  . Chronic lower back pain   . Depression   . Depression    Phreesia 10/03/2020  . Diabetes mellitus without complication (HCC)    Phreesia 10/03/2020  . Family history of adverse reaction to anesthesia    Mother has trouble coming out of anesthesia; patient reports she has no problems  . Fibromyalgia   . High cholesterol   . Hypertension    Phreesia 10/03/2020  . Presence of permanent cardiac pacemaker   . Seizures (HCC) 11/2017   "day after she came home from hospital after having stroke" (01/01/2018)  . Stroke [facility]) 12/06/2017   "speech issues since" (01/01/2018)  . Type II diabetes mellitus (HCC)     Past Surgical History:  Procedure Laterality Date  . CHOLECYSTECTOMY    . EYE SURGERY N/A    Phreesia 10/03/2020  . GAS/FLUID EXCHANGE Right 12/12/2019   Procedure: Gas/Fluid Exchange;  Surgeon: Carmela Rima, MD;  Location: [facility] OR;  Service: Ophthalmology;  Laterality: Right;  . INSERT / REPLACE / REMOVE PACEMAKER  01/01/2018  . IR CT HEAD LTD  12/06/2017  . IR PERCUTANEOUS ART THROMBECTOMY/INFUSION INTRACRANIAL INC DIAG ANGIO  12/06/2017  . LOOP RECORDER INSERTION N/A 12/12/2017   Procedure: LOOP RECORDER INSERTION;  Surgeon: Regan Lemming, MD;  [facility] INVASIVE CV  LAB;  Service: Cardiovascular;  Laterality: N/A;  . LOOP RECORDER REMOVAL  01/01/2018  . LOOP RECORDER REMOVAL N/A 01/01/2018   Procedure: LOOP RECORDER REMOVAL;  Surgeon: Regan Lemming, MD;  [facility]: [facility] INVASIVE CV LAB;  Service: Cardiovascular;  Laterality: N/A;  . NECK SURGERY    . PACEMAKER IMPLANT N/A 01/01/2018   Procedure: PACEMAKER IMPLANT;  Surgeon: Regan Lemming, MD;  [facility]: [facility] INVAS[facility] CV LAB;  Service: Cardiovascular;  Laterality: N/A;  . PHOTOCOAGULATION Right 12/12/2019   Procedure: Photocoagulation;  Surgeon: Carmela Rima, MD;  Location: [facility] OR;  Service: Ophthalmology;  Laterality: Right;  . RADIOLOGY WITH ANESTHESIA N/A 12/06/2017   Procedure: CODE STROKE;  Surgeon: Julieanne Cotton, MD;  Location: [facility] OR;  Service: Radiology;  Laterality: N/A;  . SCLERAL BUCKLE WITH POSSIBLE 25 GAUGE PARS PLANA VITRECTOMY Right 12/12/2019   Procedure: SCLERAL BUCKLE WITH  PARS PLANA VITRECTOMY RETINAL DETACHMENT REPAIR;  Surgeon: Carmela Rima, MD;  Location: [facility] OR;  Service: Ophthalmology;  Laterality: Right;  . TEE WITHOUT CARDIOVERSION N/A 12/10/2017   Procedure: TRANSESOPHAGEAL ECHOCARDIOGRAM (TEE);  Surgeon: Jake Bathe, MD;  Location: [facility] ENDOSCOPY;  Service: Cardiovascular;  Laterality: N/A;  . THROMBECTOMY FEMORAL ARTERY Right 12/07/2017   Procedure: THROMBECTOMY RIGHT FEMORAL ARTERY;  Surgeon: Maeola Harman, MD;  Location: [facility] OR;  Service: Vascular;  Laterality: Right;  . TONSILLECTOMY  There were no vitals filed for this visit.   Subjective Assessment - 10/23/20 1401    Subjective "I don't remember" re: HEP    Currently in Pain? No/denies                 ADULT SLP TREATMENT - 10/23/20 1437      General Information   Behavior/Cognition Alert;Cooperative;Pleasant mood      Treatment Provided   Treatment provided Cognitive-Linquistic      Cognitive-Linquistic Treatment   Treatment focused on Aphasia;Patient/family/caregiver education     Skilled Treatment SLP introduced gestures or functional phrase to request family/friends for more time for word finding compensation and/or assistance to cue words. Pt reports poor response to verbal requests, in which gestures would probably be more effective. SLP educated pt on utilizing notepad/sticky pad for multimodal communciation to self-cue words. SLP targeted naming in simple categories, which pt able to utilize written approximations x2 with 50% effectiveness.  Semantic cue x1 only required for naming in categories x5. Pt able to compensate for anomia in conversation with additional processing time, descriptions, and gestures with rare min A.      Assessment / Recommendations / Plan   Plan Continue with current plan of care      Progression Toward Goals   Progression toward goals Progressing toward goals            SLP Education - 10/23/20 1436    Education Details compensations, multimodal communication, use notepad at home    Person(s) Educated Patient    Methods Explanation;Demonstration;Verbal cues;Handout    Comprehension Verbalized understanding;Returned demonstration;Need further instruction            SLP Short Term Goals - 10/23/20 1457      SLP SHORT TERM GOAL #1   Title Pt will name 10 items in a simple category with occasional min A over 2 sessions    Baseline 10-19-20, 10-23-20    Status Achieved      SLP SHORT TERM GOAL #2   Title Pt will write a word or word approximation to compensate for verbal expression with rare min A for accuracy in 2 sessions    Baseline 10-23-20    Time 2    Period Weeks    Status On-going      SLP SHORT TERM GOAL #3   Title pt will complete a communication-related quality of life measure in the first therapy session    Baseline Communication SF: 14 & Communication Effectiveness Survey: 17    Status Achieved      SLP SHORT TERM GOAL #4   Baseline --    Time --    Status --      SLP SHORT TERM GOAL #5   Baseline --     Time --    Period --    Status --            SLP Long Term Goals - 10/23/20 1459      SLP LONG TERM GOAL #1   Title Pt will successfully write a word or word approximation to compensate for verbal expression in 2 sessions    Time 5    Period Weeks    Status On-going      SLP LONG TERM GOAL #2   Title pt will particpate in 8 minutes simple conversation with compensations for verbal expression in 3 sessions    Time 5    Period Weeks    Status On-going  SLP LONG TERM GOAL #3   Title Pt will report using appropriate gesture or short phrase to indicate to family in a moment of difficulty that rushing her speech is not helpful to her, 3 times   as a response to son's request to "spit it out"   Time 5    Period Weeks    Status On-going      SLP LONG TERM GOAL #4   Title pt will indicate a greater quality of life inthe last 2 sessions, compared to session #1    Time 5    Period Weeks    Status On-going            Plan - 10/23/20 1454    Clinical Impression Statement Marvalene presents with intermittent word finding deficits and dysfluency. Pt able to intermittently independently self-correct anomia, with SLP providing education and training of compensations and multimodal communication skills to improve carryover and minimize communication breakdown negatively impacting her daily life. Skilled ST intervention recommended to address aphasia to enhance QOL and communication effectiveness.    Speech Therapy Frequency 2x / week    Treatment/Interventions Compensatory strategies;Functional tasks;Environmental controls;Multimodal communcation approach;SLP instruction and feedback    Potential to Achieve Goals Good    Potential Considerations Previous level of function;Family/community support    SLP Home Exercise Plan Compensations    Consulted and Agree with Plan of Care Patient           Patient will benefit from skilled therapeutic intervention in order to improve the  following deficits and impairments:   Aphasia    Problem List Patient Active Problem List   Diagnosis Date Noted  . Bilateral high frequency sensorineural hearing loss 02/25/2019  . Non compliance with medical treatment 03/23/2018  . History of tobacco use disorder 01/01/2018  . Syncope and collapse 01/01/2018  . 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 Acute right hemiparesis (HCC) 12/12/2017  . History of completed stroke 12/12/2017  . Leukocytosis 12/12/2017  . Hypokalemia 12/12/2017  . Hypotension   . 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 Poor compliance with CPAP treatment 03/04/2017  . OSA and COPD overlap syndrome (HCC) 03/04/2017  . Simple chronic bronchitis (HCC) 03/04/2017  . MCI (mild cognitive impairment) 11/27/2016  . Left sided lacunar infarction (HCC) 03/11/2014  . Cerebrovascular small vessel disease 03/11/2014  . Insomnia, idiopathic 12/20/2013  . Snoring 12/06/2013  . Essential hypertension, benign 10/26/2012  . Hyperlipidemia LDL goal <70 10/26/2012  . Polypharmacy 10/26/2012  . DM (diabetes mellitus) (HCC) 10/13/2011  . Tremor 10/13/2011  . GERD (gastroesophageal reflux disease) 10/13/2011  . Depression 10/13/2011    Janann Colonel, MA CCC-SLP 10/23/2020, 3:03 PM  [facility] [address] [place], [place], [address] Phone: [phone]   Fax:  [phone]   Name: WESLEIGH MARKOVIC MRN: [id number] Date of Birth: Nov 05, 1960

## 2020-10-23 NOTE — Patient Instructions (Signed)
  If you need extra time to find your words, use gestures (hold up pointer finger or full hand) so your listener knows you need a moment or to slow down for you to understand them.  Write down word to help you say it or to remember it for later in the conversation (try to spell it as close to the real word as possible)

## 2020-10-24 ENCOUNTER — Ambulatory Visit (INDEPENDENT_AMBULATORY_CARE_PROVIDER_SITE_OTHER): Payer: Medicare PPO | Admitting: Psychologist

## 2020-10-24 DIAGNOSIS — F321 Major depressive disorder, single episode, moderate: Secondary | ICD-10-CM

## 2020-10-25 ENCOUNTER — Other Ambulatory Visit: Payer: Self-pay

## 2020-10-25 ENCOUNTER — Ambulatory Visit: Payer: Medicare PPO

## 2020-10-25 ENCOUNTER — Encounter: Payer: Medicare PPO | Admitting: Occupational Therapy

## 2020-10-25 ENCOUNTER — Other Ambulatory Visit: Payer: Self-pay | Admitting: Endocrinology

## 2020-10-25 DIAGNOSIS — M6281 Muscle weakness (generalized): Secondary | ICD-10-CM | POA: Diagnosis not present

## 2020-10-25 DIAGNOSIS — R4701 Aphasia: Secondary | ICD-10-CM

## 2020-10-25 DIAGNOSIS — R251 Tremor, unspecified: Secondary | ICD-10-CM | POA: Diagnosis not present

## 2020-10-25 DIAGNOSIS — R278 Other lack of coordination: Secondary | ICD-10-CM | POA: Diagnosis not present

## 2020-10-25 DIAGNOSIS — E1149 Type 2 diabetes mellitus with other diabetic neurological complication: Secondary | ICD-10-CM

## 2020-10-25 DIAGNOSIS — Z794 Long term (current) use of insulin: Secondary | ICD-10-CM

## 2020-10-25 NOTE — Therapy (Signed)
[facility] [address] [place], [place], [address] Phone: [phone]   Fax:  [phone]  Speech Language Pathology Treatment  Patient Details  Name: Brittany [name] MRN: [id number] Date of Birth: 1961/08/18 Referring Provider (SLP): Janeece Agee, NP   Encounter Date: 10/25/2020   End of Session - 10/25/20 1710    Visit Number 4    Number of Visits 13    Date for SLP Re-Evaluation 01/05/21    SLP Start Time 1319    SLP Stop Time  1400    SLP Time Calculation (min) 41 min    Activity Tolerance Patient tolerated treatment well           Past Medical History:  Diagnosis Date  . Asthma   . Cataract    Phreesia 10/03/2020  . Chronic lower back pain   . Depression   . Depression    Phreesia 10/03/2020  . Diabetes mellitus without complication (HCC)    Phreesia 10/03/2020  . Family history of adverse reaction to anesthesia    Mother has trouble coming out of anesthesia; patient reports she has no problems  . Fibromyalgia   . High cholesterol   . Hypertension    Phreesia 10/03/2020  . Presence of permanent cardiac pacemaker   . Seizures (HCC) 11/2017   "day after she came home from hospital after having stroke" (01/01/2018)  . Stroke [facility]) 12/06/2017   "speech issues since" (01/01/2018)  . Type II diabetes mellitus (HCC)     Past Surgical History:  Procedure Laterality Date  . CHOLECYSTECTOMY    . EYE SURGERY N/A    Phreesia 10/03/2020  . GAS/FLUID EXCHANGE Right 12/12/2019   Procedure: Gas/Fluid Exchange;  Surgeon: Carmela Rima, MD;  Location: [facility] OR;  Service: Ophthalmology;  Laterality: Right;  . INSERT / REPLACE / REMOVE PACEMAKER  01/01/2018  . IR CT HEAD LTD  12/06/2017  . IR PERCUTANEOUS ART THROMBECTOMY/INFUSION INTRACRANIAL INC DIAG ANGIO  12/06/2017  . LOOP RECORDER INSERTION N/A 12/12/2017   Procedure: LOOP RECORDER INSERTION;  Surgeon: Regan Lemming, MD;  [facility];  Service: Cardiovascular;  Laterality: N/A;  . LOOP RECORDER REMOVAL  01/01/2018  . LOOP RECORDER REMOVAL N/A 01/01/2018   Procedure: LOOP RECORDER REMOVAL;  Surgeon: Regan Lemming, MD;  [facility]: [facility] LAB;  Service: Cardiovascular;  Laterality: N/A;  . NECK SURGERY    . PACEMAKER IMPLANT N/A 01/01/2018   Procedure: PACEMAKER IMPLANT;  Surgeon: Regan Lemming, MD;  [facility]: [facility];  Service: Cardiovascular;  Laterality: N/A;  . PHOTOCOAGULATION Right 12/12/2019   Procedure: Photocoagulation;  Surgeon: Carmela Rima, MD;  Location: [facility] OR;  Service: Ophthalmology;  Laterality: Right;  . RADIOLOGY WITH ANESTHESIA N/A 12/06/2017   Procedure: CODE STROKE;  Surgeon: Julieanne Cotton, MD;  Location: [facility] OR;  Service: Radiology;  Laterality: N/A;  . SCLERAL BUCKLE WITH POSSIBLE 25 GAUGE PARS PLANA VITRECTOMY Right 12/12/2019   Procedure: SCLERAL BUCKLE WITH  PARS PLANA VITRECTOMY RETINAL DETACHMENT REPAIR;  Surgeon: Carmela Rima, MD;  Location: [facility] OR;  Service: Ophthalmology;  Laterality: Right;  . TEE WITHOUT CARDIOVERSION N/A 12/10/2017   Procedure: TRANSESOPHAGEAL ECHOCARDIOGRAM (TEE);  Surgeon: Jake Bathe, MD;  Location: [facility] ENDOSCOPY;  Service: Cardiovascular;  Laterality: N/A;  . THROMBECTOMY FEMORAL ARTERY Right 12/07/2017   Procedure: THROMBECTOMY RIGHT FEMORAL ARTERY;  Surgeon: Maeola Harman, MD;  Location: [facility] OR;  Service: Vascular;  Laterality: Right;  . TONSILLECTOMY  There were no vitals filed for this visit.   Subjective Assessment - 10/25/20 1322    Subjective "I  know what it is but I have trouble - then I can't write it. Like the other day, I couldn't get pine - for pineapple." (pt, with pausing/hesitation)    Currently in Pain? No/denies                 ADULT SLP TREATMENT - 10/25/20 1325      General Information   Behavior/Cognition Alert;Cooperative;Pleasant mood      Treatment Provided   Treatment provided  Cognitive-Linquistic      Cognitive-Linquistic Treatment   Treatment focused on Aphasia;Patient/family/caregiver education    Skilled Treatment Pt reports she hasn't used the gesture for more time with her family to date, but shows SLP that it is a "stop" hand. Pt endorsed that she does like to read Verlan Friends but does have trouble comprehending the novel post-CVA. SLP assisted pt in logging on to [employer] to obtain audiobooks for listening. SLP used teachback method and pt got to the site and started listening. She thanked SLP for this education today.      Assessment / Recommendations / Plan   Plan Continue with current plan of care      Progression Toward Goals   Progression toward goals Progressing toward goals            SLP Education - 10/25/20 1710    Education Details process of obtaining audiobooks from [employer]) Educated Patient    Methods Explanation;Demonstration;Verbal cues    Comprehension Verbalized understanding;Returned demonstration;Verbal cues required;Need further instruction            SLP Short Term Goals - 10/25/20 1713      SLP SHORT TERM GOAL #1   Title Pt will name 10 items in a simple category with occasional min A over 2 sessions    Baseline 10-19-20    Time 2    Period Weeks    Status On-going      SLP SHORT TERM GOAL #2   Title Pt will write a word or word approximation to compensate for verbal expression with rare min A for accuracy in 2 sessions    Time 2    Period Weeks    Status On-going      SLP SHORT TERM GOAL #3   Title pt will complete a communication-related quality of life measure in the first therapy session    Status Achieved            SLP Long Term Goals - 10/25/20 1714      SLP LONG TERM GOAL #1   Title Pt will successfully write a word or word approximation to compensate for verbal expression in 2 sessions    Time 5    Period Weeks    Status On-going      SLP LONG TERM GOAL #2    Title pt will particpate in 8 minutes simple conversation with compensations for verbal expression in 3 sessions    Time 5    Period Weeks    Status On-going      SLP LONG TERM GOAL #3   Title Pt will report using appropriate gesture or short phrase to indicate to family in a moment of difficulty that rushing her speech is not helpful to her, 3 times   as a response to son's request to "spit it out"   Time 5  Period Weeks    Status On-going      SLP LONG TERM GOAL #4   Title pt will indicate a greater quality of life inthe last 2 sessions, compared to session #1    Time 5    Period Weeks    Status On-going            Plan - 10/25/20 1711    Clinical Impression Statement Brittany [name] presents with intermittent word finding deficits and dysfluency. Pt able to intermittently independently self-correct anomia, with SLP providing education and training of compensations and multimodal communication skills to improve carryover and minimize communication breakdown negatively impacting her daily life. She reported difficulty with understanding novels during last session, but a desire to engage with this media. Skilled ST intervention recommended to address aphasia to enhance QOL and communication effectiveness.    Speech Therapy Frequency 2x / week    Treatment/Interventions Compensatory strategies;Functional tasks;Environmental controls;Multimodal communcation approach;SLP instruction and feedback    Potential to Achieve Goals Good    Potential Considerations Previous level of function;Family/community support    SLP Home Exercise Plan Compensations    Consulted and Agree with Plan of Care Patient           Patient will benefit from skilled therapeutic intervention in order to improve the following deficits and impairments:   Aphasia    Problem List Patient Active Problem List   Diagnosis Date Noted  . Bilateral high frequency sensorineural hearing loss 02/25/2019  . Non compliance  with medical treatment 03/23/2018  . History of tobacco use disorder 01/01/2018  . Syncope and collapse 01/01/2018  . 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 Acute right hemiparesis (HCC) 12/12/2017  . History of completed stroke 12/12/2017  . Leukocytosis 12/12/2017  . Hypokalemia 12/12/2017  . Hypotension   . 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 Poor compliance with CPAP treatment 03/04/2017  . OSA and COPD overlap syndrome (HCC) 03/04/2017  . Simple chronic bronchitis (HCC) 03/04/2017  . MCI (mild cognitive impairment) 11/27/2016  . Left sided lacunar infarction (HCC) 03/11/2014  . Cerebrovascular small vessel disease 03/11/2014  . Insomnia, idiopathic 12/20/2013  . Snoring 12/06/2013  . Essential hypertension, benign 10/26/2012  . Hyperlipidemia LDL goal <70 10/26/2012  . Polypharmacy 10/26/2012  . DM (diabetes mellitus) (HCC) 10/13/2011  . Tremor 10/13/2011  . GERD (gastroesophageal reflux disease) 10/13/2011  . Depression 10/13/2011    [facility] ,MS, CCC-SLP  10/25/2020, 5:14 PM  [place] [facility] [address] [place], [place], [address] Phone: [phone]   Fax:  [phone]   Name: Brittany [name] MRN: [id number] Date of Birth: 1961/02/19

## 2020-10-25 NOTE — Patient Instructions (Signed)
    Go to   https://library.[place].gov/   and get some Waymond Cera  AUDIOBOOKS!

## 2020-11-10 ENCOUNTER — Ambulatory Visit: Payer: Medicare PPO | Attending: Registered Nurse

## 2020-11-10 ENCOUNTER — Other Ambulatory Visit: Payer: Self-pay

## 2020-11-10 DIAGNOSIS — R4701 Aphasia: Secondary | ICD-10-CM | POA: Diagnosis not present

## 2020-11-10 NOTE — Therapy (Signed)
[facility] [address] [place], [place], [address] Phone: [phone]   Fax:  [phone]  Speech Language Pathology Treatment  Patient Details  Name: Brittany [name] MRN: [id number] Date of Birth: [birth year] Referring Provider (SLP): Janeece Agee, NP   Encounter Date: 11/10/2020   End of Session - 11/10/20 1612    Visit Number 5    Number of Visits 13    Date for SLP Re-Evaluation 01/05/21    SLP Start Time 1525    SLP Stop Time  1610    SLP Time Calculation (min) 45 min    Activity Tolerance Patient tolerated treatment well           Past Medical History:  Diagnosis Date  . Asthma   . Cataract    Phreesia 10/03/2020  . Chronic lower back pain   . Depression   . Depression    Phreesia 10/03/2020  . Diabetes mellitus without complication (HCC)    Phreesia 10/03/2020  . Family history of adverse reaction to anesthesia    Mother has trouble coming out of anesthesia; patient reports she has no problems  . Fibromyalgia   . High cholesterol   . Hypertension    Phreesia 10/03/2020  . Presence of permanent cardiac pacemaker   . Seizures (HCC) 11/2017   "day after she came home from hospital after having stroke" (01/01/2018)  . Stroke [facility]) 12/06/2017   "speech issues since" (01/01/2018)  . Type II diabetes mellitus (HCC)     Past Surgical History:  Procedure Laterality Date  . CHOLECYSTECTOMY    . EYE SURGERY N/A    Phreesia 10/03/2020  . GAS/FLUID EXCHANGE Right 12/12/2019   Procedure: Gas/Fluid Exchange;  Surgeon: Carmela Rima, MD;  Location: [facility] OR;  Service: Ophthalmology;  Laterality: Right;  . INSERT / REPLACE / REMOVE PACEMAKER  01/01/2018  . IR CT HEAD LTD  12/06/2017  . IR PERCUTANEOUS ART THROMBECTOMY/INFUSION INTRACRANIAL INC DIAG ANGIO  12/06/2017  . LOOP RECORDER INSERTION N/A 12/12/2017   Procedure: LOOP RECORDER INSERTION;  Surgeon: Regan Lemming, MD;  [facility] INVASIVE CV LAB;   Service: Cardiovascular;  Laterality: N/A;  . LOOP RECORDER REMOVAL  01/01/2018  . LOOP RECORDER REMOVAL N/A 01/01/2018   Procedure: LOOP RECORDER REMOVAL;  Surgeon: Regan Lemming, MD;  [facility]: [facility] INVASIVE CV LAB;  Service: Cardiovascular;  Laterality: N/A;  . NECK SURGERY    . PACEMAKER IMPLANT N/A 01/01/2018   Procedure: PACEMAKER IMPLANT;  Surgeon: Regan Lemming, MD;  [facility]: [facility] INVASIVE CV LAB;  Service: Cardiovascular;  Laterality: N/A;  . PHOTOCOAGULATION Right 12/12/2019   Procedure: Photocoagulation;  Surgeon: Carmela Rima, MD;  Location: [facility] OR;  Service: Ophthalmology;  Laterality: Right;  . RADIOLOGY WITH ANESTHESIA N/A 12/06/2017   Procedure: CODE STROKE;  Surgeon: Julieanne Cotton, MD;  Location: [facility] OR;  Service: Radiology;  Laterality: N/A;  . SCLERAL BUCKLE WITH POSSIBLE 25 GAUGE PARS PLANA VITRECTOMY Right 12/12/2019   Procedure: SCLERAL BUCKLE WITH  PARS PLANA VITRECTOMY RETINAL DETACHMENT REPAIR;  Surgeon: Carmela Rima, MD;  Location[facility] OR;  Service: Ophthalmology;  Laterality: Right;  . TEE WITHOUT CARDIOVERSION N/A 12/10/2017   Procedure: TRANSESOPHAGEAL ECHOCARDIOGRAM (TEE);  Surgeon: Jake Bathe, MD;  Location: [facility] ENDOSCOPY;  Service: Cardiovascular;  Laterality: N/A;  . THROMBECTOMY FEMORAL ARTERY Right 12/07/2017   Procedure: THROMBECTOMY RIGHT FEMORAL ARTERY;  Surgeon: Maeola Harman, MD;  Location: [facility] OR;  Service: Vascular;  Laterality: Right;  . TONSILLECTOMY  There were no vitals filed for this visit.   Subjective Assessment - 11/10/20 1526    Subjective "today is a bad day" re: speech    Currently in Pain? No/denies                 ADULT SLP TREATMENT - 11/10/20 1526      General Information   Behavior/Cognition Alert;Cooperative;Pleasant mood      Treatment Provided   Treatment provided Cognitive-Linquistic      Cognitive-Linquistic Treatment   Treatment focused on Aphasia;Patient/family/caregiver education     Skilled Treatment SLP introduced SFA this session, in which pt able to demonstrate with min A and additonal processing time. SLP targeted naming with description provided with 100% accuracy achieved. SLP targeted pt verbalizing description given targeted word, with occasional writing, gestures, and pictures utilized independently. Occasional min A provided for semantic cues to expand or elaborate responses. Rare min A for phonemic cues or first letter. SLP targeted naming 10 items in simple category, which pt able to name with occasional additional processing time time and rare min A. Independent use of writing noted x3 during session.      Assessment / Recommendations / Plan   Plan Continue with current plan of care      Progression Toward Goals   Progression toward goals Progressing toward goals            SLP Education - 11/10/20 1613    Education Details SFA, Talk Path, requesting additional time when talking with family    Person(s) Educated Patient    Methods Explanation;Demonstration;Verbal cues    Comprehension Verbalized understanding;Returned demonstration;Need further instruction            SLP Short Term Goals - 11/10/20 1518      SLP SHORT TERM GOAL #1   Title Pt will name 10 items in a simple category with occasional min A over 2 sessions    Baseline 10-19-20, 10-23-20    Time --    Period --    Status Achieved      SLP SHORT TERM GOAL #2   Title Pt will write a word or word approximation to compensate for verbal expression with rare min A for accuracy in 2 sessions    Baseline 10-23-20, 11-10-20    Time --    Period Weeks    Status Achieved      SLP SHORT TERM GOAL #3   Title pt will complete a communication-related quality of life measure in the first therapy session    Status Achieved            SLP Long Term Goals - 11/10/20 1518      SLP LONG TERM GOAL #1   Title Pt will successfully write a word or word approximation to compensate for verbal  expression in 2 sessions    Baseline 11-10-20    Time 4    Period Weeks    Status On-going      SLP LONG TERM GOAL #2   Title pt will particpate in 8 minutes simple conversation with compensations for verbal expression in 3 sessions    Baseline 11-10-20    Time 4    Period Weeks    Status On-going      SLP LONG TERM GOAL #3   Title Pt will report using appropriate gesture or short phrase to indicate to family in a moment of difficulty that rushing her speech is not helpful to her, 3 times  as a response to son's request to "spit it out"   Baseline 11-10-20    Time 4    Period Weeks    Status On-going      SLP LONG TERM GOAL #4   Title pt will indicate a greater quality of life inthe last 2 sessions, compared to session #1    Time 4    Period Weeks    Status On-going            Plan - 11/10/20 1609    Clinical Impression Statement Brittany [name] presents with intermittent word finding deficits and dysfluency. Pt able to intermittently independently self-correct anomia, with SLP providing education and training of compensations and multimodal communication skills to improve carryover and minimize communication breakdown negatively impacting her daily life. SLP recommended pt trial Talk Path at home to target speech skills. Skilled ST intervention recommended to address aphasia to enhance QOL and communication effectiveness.    Speech Therapy Frequency 1x /week    Duration Other (comment)   6 weeks or 13 total visits   Treatment/Interventions Compensatory strategies;Functional tasks;Environmental controls;Multimodal communcation approach;SLP instruction and feedback    Potential to Achieve Goals Good    Potential Considerations Previous level of function;Family/community support    SLP Home Exercise Plan Compensations, Talk Path    Consulted and Agree with Plan of Care Patient           Patient will benefit from skilled therapeutic intervention in order to improve the following deficits  and impairments:   Aphasia    Problem List Patient Active Problem List   Diagnosis Date Noted  . Bilateral high frequency sensorineural hearing loss 02/25/2019  . Non compliance with medical treatment 03/23/2018  . History of tobacco use disorder 01/01/2018  . Syncope and collapse 01/01/2018  . 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 Acute right hemiparesis (HCC) 12/12/2017  . History of completed stroke 12/12/2017  . Leukocytosis 12/12/2017  . Hypokalemia 12/12/2017  . Hypotension   . 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 Poor compliance with CPAP treatment 03/04/2017  . OSA and COPD overlap syndrome (HCC) 03/04/2017  . Simple chronic bronchitis (HCC) 03/04/2017  . MCI (mild cognitive impairment) 11/27/2016  . Left sided lacunar infarction (HCC) 03/11/2014  . Cerebrovascular small vessel disease 03/11/2014  . Insomnia, idiopathic 12/20/2013  . Snoring 12/06/2013  . Essential hypertension, benign 10/26/2012  . Hyperlipidemia LDL goal <70 10/26/2012  . Polypharmacy 10/26/2012  . DM (diabetes mellitus) (HCC) 10/13/2011  . Tremor 10/13/2011  . GERD (gastroesophageal reflux disease) 10/13/2011  . Depression 10/13/2011    Janann Colonel, MA CCC-SLP 11/10/2020, 4:17 PM  [facility] [address] [name], [place], [address] Phone: [phone]   Fax:  [phone]   Name: Brittany [name] MRN: [id number] Date of Birth: [birth year]

## 2020-11-14 ENCOUNTER — Ambulatory Visit: Payer: Medicare PPO

## 2020-11-14 ENCOUNTER — Other Ambulatory Visit: Payer: Self-pay

## 2020-11-14 DIAGNOSIS — R4701 Aphasia: Secondary | ICD-10-CM | POA: Diagnosis not present

## 2020-11-14 NOTE — Patient Instructions (Signed)
   Use the sheet to review words you cannot think of that are nouns (places, people, things)  If there is a word you cannot think of multiple times (like "custody" today), you need to say 5 sentences using that word. Have someone write them down for you or use your phone and say them with the speech to text. Read the sentences to yourself after you think of them.

## 2020-11-14 NOTE — Therapy (Signed)
[facility] [address] [place], [place], [address] Phone: [phone]   Fax:  [phone]  Speech Language Pathology Treatment  Patient Details  Name: Brittany [name] MRN: [id number] Date of Birth: 1961/04/18 Referring Provider (SLP): Janeece Agee, NP   Encounter Date: 11/14/2020   End of Session - 11/14/20 1654    Visit Number 6    Number of Visits 13    Date for SLP Re-Evaluation 01/05/21    SLP Start Time 1538    SLP Stop Time  1620    SLP Time Calculation (min) 42 min    Activity Tolerance Patient tolerated treatment well           Past Medical History:  Diagnosis Date  . Asthma   . Cataract    Phreesia 10/03/2020  . Chronic lower back pain   . Depression   . Depression    Phreesia 10/03/2020  . Diabetes mellitus without complication (HCC)    Phreesia 10/03/2020  . Family history of adverse reaction to anesthesia    Mother has trouble coming out of anesthesia; patient reports she has no problems  . Fibromyalgia   . High cholesterol   . Hypertension    Phreesia 10/03/2020  . Presence of permanent cardiac pacemaker   . Seizures (HCC) 11/2017   "day after she came home from hospital after having stroke" (01/01/2018)  . Stroke [facility]) 12/06/2017   "speech issues since" (01/01/2018)  . Type II diabetes mellitus (HCC)     Past Surgical History:  Procedure Laterality Date  . CHOLECYSTECTOMY    . EYE SURGERY N/A    Phreesia 10/03/2020  . GAS/FLUID EXCHANGE Right 12/12/2019   Procedure: Gas/Fluid Exchange;  Surgeon: Carmela Rima, MD;  Location: [facility] OR;  Service: Ophthalmology;  Laterality: Right;  . INSERT / REPLACE / REMOVE PACEMAKER  01/01/2018  . IR CT HEAD LTD  12/06/2017  . IR PERCUTANEOUS ART THROMBECTOMY/INFUSION INTRACRANIAL INC DIAG ANGIO  12/06/2017  . LOOP RECORDER INSERTION N/A 12/12/2017   Procedure: LOOP RECORDER INSERTION;  Surgeon: Regan Lemming, MD;  [facility] CV LAB;   Service: Cardiovascular;  Laterality: N/A;  . LOOP RECORDER REMOVAL  01/01/2018  . LOOP RECORDER REMOVAL N/A 01/01/2018   Procedure: LOOP RECORDER REMOVAL;  Surgeon: Regan Lemming, MD;  [facility]: [facility] CV LAB;  Service: Cardiovascular;  Laterality: N/A;  . NECK SURGERY    . PACEMAKER IMPLANT N/A 01/01/2018   Procedure: PACEMAKER IMPLANT;  Surgeon: Regan Lemming, MD;  [facility]: [facility] LAB;  Service: Cardiovascular;  Laterality: N/A;  . PHOTOCOAGULATION Right 12/12/2019   Procedure: Photocoagulation;  Surgeon: Carmela Rima, MD;  Location: [facility] OR;  Service: Ophthalmology;  Laterality: Right;  . RADIOLOGY WITH ANESTHESIA N/A 12/06/2017   Procedure: CODE STROKE;  Surgeon: Julieanne Cotton, MD;  Location: [facility] OR;  Service: Radiology;  Laterality: N/A;  . SCLERAL BUCKLE WITH POSSIBLE 25 GAUGE PARS PLANA VITRECTOMY Right 12/12/2019   Procedure: SCLERAL BUCKLE WITH  PARS PLANA VITRECTOMY RETINAL DETACHMENT REPAIR;  Surgeon: Carmela Rima, MD;  Location: [facility] OR;  Service: Ophthalmology;  Laterality: Right;  . TEE WITHOUT CARDIOVERSION N/A 12/10/2017   Procedure: TRANSESOPHAGEAL ECHOCARDIOGRAM (TEE);  Surgeon: Jake Bathe, MD;  Location: [facility] ENDOSCOPY;  Service: Cardiovascular;  Laterality: N/A;  . THROMBECTOMY FEMORAL ARTERY Right 12/07/2017   Procedure: THROMBECTOMY RIGHT FEMORAL ARTERY;  Surgeon: Maeola Harman, MD;  Location: [name] OR;  Service: Vascular;  Laterality: Right;  . TONSILLECTOMY  There were no vitals filed for this visit.          ADULT SLP TREATMENT - 11/14/20 1602      General Information   Behavior/Cognition Alert;Cooperative;Pleasant mood      Treatment Provided   Treatment provided Cognitive-Linquistic      Cognitive-Linquistic Treatment   Treatment focused on Aphasia;Patient/family/caregiver education    Skilled Treatment SLP reviewed semantic feature analysis (SFA) with pt and told pt she could do 3-4 of these each day, with the  target words as common things around her home. SLP provided pt a copy of SFA framework. SLP re-educated/reiterated that due to time post onset that ST focusing on compensations and techniques pt can use at home. Pt acknowledged understanding. SLP engaged pt in mod complex conversation for 15 minutes - pt functional language with extra time. Pt used self-initiated written cues once in a word finding event. Pt stated she cont to have difficulty with "custody" - SLP told pt to say 4-5 sentences with the word, adn if this happens again do the same thing - making 4-5 sentences out of the word. Pt has used nand signals to allow her extra time at home since last session.      Assessment / Recommendations / Plan   Plan Continue with current plan of care      Progression Toward Goals   Progression toward goals Progressing toward goals            SLP Education - 11/14/20 1654    Education Details say 5 sentences with words you have anomia repeatedly    Person(s) Educated Patient    Methods Explanation    Comprehension Verbalized understanding            SLP Short Term Goals - 11/10/20 1518      SLP SHORT TERM GOAL #1   Title Pt will name 10 items in a simple category with occasional min A over 2 sessions    Baseline 10-19-20, 10-23-20    Time --    Period --    Status Achieved      SLP SHORT TERM GOAL #2   Title Pt will write a word or word approximation to compensate for verbal expression with rare min A for accuracy in 2 sessions    Baseline 10-23-20, 11-10-20    Time --    Period Weeks    Status Achieved      SLP SHORT TERM GOAL #3   Title pt will complete a communication-related quality of life measure in the first therapy session    Status Achieved            SLP Long Term Goals - 11/14/20 1605      SLP LONG TERM GOAL #1   Title Pt will successfully write a word or word approximation to compensate for verbal expression in 2 sessions    Baseline 11-10-20    Time 3    Period  Weeks    Status On-going      SLP LONG TERM GOAL #2   Title pt will particpate in 8 minutes simple conversation with compensations for verbal expression in 3 sessions    Baseline 11-10-20, 11-14-20    Time 3    Period Weeks    Status On-going      SLP LONG TERM GOAL #3   Title Pt will report using appropriate gesture or short phrase to indicate to family in a moment of difficulty that rushing her speech is  not helpful to her, 3 times   as a response to son's request to "spit it out"   Baseline 11-10-20, 11-14-20    Time 3    Period Weeks    Status On-going      SLP LONG TERM GOAL #4   Title pt will indicate a greater quality of life in the last 2 sessions, compared to session #1    Time 3    Period Weeks    Status On-going            Plan - 11/14/20 1654    Clinical Impression Statement Brittany [name] presents with intermittent word finding deficits and dysfluency due to anomia. Pt able to intermittently independently self-correct anomia, with SLP providing education and training of compensations and multimodal communication skills to improve carryover and minimize communication breakdown negatively impacting her daily life. See "skilled intervention" for more details of today's session. Skilled ST intervention recommended to address aphasia to enhance QOL and communication effectiveness.    Speech Therapy Frequency 1x /week    Duration Other (comment)   6 weeks or 13 total visits   Treatment/Interventions Compensatory strategies;Functional tasks;Environmental controls;Multimodal communcation approach;SLP instruction and feedback    Potential to Achieve Goals Good    Potential Considerations Previous level of function;Family/community support    SLP Home Exercise Plan Compensations, Talk Path    Consulted and Agree with Plan of Care Patient           Patient will benefit from skilled therapeutic intervention in order to improve the following deficits and impairments:    Aphasia    Problem List Patient Active Problem List   Diagnosis Date Noted  . Bilateral high frequency sensorineural hearing loss 02/25/2019  . Non compliance with medical treatment 03/23/2018  . History of tobacco use disorder 01/01/2018  . Syncope and collapse 01/01/2018  . 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 Acute right hemiparesis (HCC) 12/12/2017  . History of completed stroke 12/12/2017  . Leukocytosis 12/12/2017  . Hypokalemia 12/12/2017  . Hypotension   . 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 Poor compliance with CPAP treatment 03/04/2017  . OSA and COPD overlap syndrome (HCC) 03/04/2017  . Simple chronic bronchitis (HCC) 03/04/2017  . MCI (mild cognitive impairment) 11/27/2016  . Left sided lacunar infarction (HCC) 03/11/2014  . Cerebrovascular small vessel disease 03/11/2014  . Insomnia, idiopathic 12/20/2013  . Snoring 12/06/2013  . Essential hypertension, benign 10/26/2012  . Hyperlipidemia LDL goal <70 10/26/2012  . Polypharmacy 10/26/2012  . DM (diabetes mellitus) (HCC) 10/13/2011  . Tremor 10/13/2011  . GERD (gastroesophageal reflux disease) 10/13/2011  . Depression 10/13/2011    [facility] ,MS, CCC-SLP  11/14/2020, 4:58 PM  [facility] [address] [place], [place], [address] Phone: [phone]   Fax:  [phone]   Name: Brittany [name] MRN: [id number] Date of Birth: July 24, 1961

## 2020-11-17 ENCOUNTER — Other Ambulatory Visit: Payer: Self-pay

## 2020-11-17 ENCOUNTER — Ambulatory Visit: Payer: Medicare PPO

## 2020-11-17 DIAGNOSIS — R4701 Aphasia: Secondary | ICD-10-CM

## 2020-11-17 NOTE — Therapy (Signed)
[facility] [address] [place], [place], [address] Phone: [phone]   Fax:  [phone]  Speech Language Pathology Treatment  Patient Details  Name: Brittany [name] MRN: [id number] Date of Birth: Feb 24, 1961 Referring Provider (SLP): Janeece Agee, NP   Encounter Date: 11/17/2020   End of Session - 11/17/20 1617    Visit Number 7    Number of Visits 13    Date for SLP Re-Evaluation 01/05/21    SLP Start Time 1450    SLP Stop Time  1530    SLP Time Calculation (min) 40 min    Activity Tolerance Patient tolerated treatment well           Past Medical History:  Diagnosis Date  . Asthma   . Cataract    Phreesia 10/03/2020  . Chronic lower back pain   . Depression   . Depression    Phreesia 10/03/2020  . Diabetes mellitus without complication (HCC)    Phreesia 10/03/2020  . Family history of adverse reaction to anesthesia    Mother has trouble coming out of anesthesia; patient reports she has no problems  . Fibromyalgia   . High cholesterol   . Hypertension    Phreesia 10/03/2020  . Presence of permanent cardiac pacemaker   . Seizures (HCC) 11/2017   "day after she came home from hospital after having stroke" (01/01/2018)  . Stroke [facility]) 12/06/2017   "speech issues since" (01/01/2018)  . Type II diabetes mellitus (HCC)     Past Surgical History:  Procedure Laterality Date  . CHOLECYSTECTOMY    . EYE SURGERY N/A    Phreesia 10/03/2020  . GAS/FLUID EXCHANGE Right 12/12/2019   Procedure: Gas/Fluid Exchange;  Surgeon: Carmela Rima, MD;  Location: [facility] OR;  Service: Ophthalmology;  Laterality: Right;  . INSERT / REPLACE / REMOVE PACEMAKER  01/01/2018  . IR CT HEAD LTD  12/06/2017  . IR PERCUTANEOUS ART THROMBECTOMY/INFUSION INTRACRANIAL INC DIAG ANGIO  12/06/2017  . LOOP RECORDER INSERTION N/A 12/12/2017   Procedure: LOOP RECORDER INSERTION;  Surgeon: Regan Lemming, MD;  [facility] IN[facility]  LAB;  Service: Cardiovascular;  Laterality: N/A;  . LOOP RECORDER REMOVAL  01/01/2018  . LOOP RECORDER REMOVAL N/A 01/01/2018   Procedure: LOOP RECORDER REMOVAL;  Surgeon: Regan Lemming, MD;  [facility]: [facility] INVAS[facility] CV LAB;  Service: Cardiovascular;  Laterality: N/A;  . NECK SURGERY    . PACEMAKER IMPLANT N/A 01/01/2018   Procedure: PACEMAKER IMPLANT;  Surgeon: Regan Lemming, MD;  [facility]: [facility];  Service: Cardiovascular;  Laterality: N/A;  . PHOTOCOAGULATION Right 12/12/2019   Procedure: Photocoagulation;  Surgeon: Carmela Rima, MD;  Location: [facility] OR;  Service: Ophthalmology;  Laterality: Right;  . RADIOLOGY WITH ANESTHESIA N/A 12/06/2017   Procedure: CODE STROKE;  Surgeon: Julieanne Cotton, MD;  Location: [facility] OR;  Service: Radiology;  Laterality: N/A;  . SCLERAL BUCKLE WITH POSSIBLE 25 GAUGE PARS PLANA VITRECTOMY Right 12/12/2019   Procedure: SCLERAL BUCKLE WITH  PARS PLANA VITRECTOMY RETINAL DETACHMENT REPAIR;  Surgeon: Carmela Rima, MD;  Location: [facility] OR;  Service: Ophthalmology;  Laterality: Right;  . TEE WITHOUT CARDIOVERSION N/A 12/10/2017   Procedure: TRANSESOPHAGEAL ECHOCARDIOGRAM (TEE);  Surgeon: Jake Bathe, MD;  Location: [facility] ENDOSCOPY;  Service: Cardiovascular;  Laterality: N/A;  . THROMBECTOMY FEMORAL ARTERY Right 12/07/2017   Procedure: THROMBECTOMY RIGHT FEMORAL ARTERY;  Surgeon: Maeola Harman, MD;  Location: [facility] OR;  Service: Vascular;  Laterality: Right;  . TONSILLECTOMY  There were no vitals filed for this visit.   Subjective Assessment - 11/17/20 1501    Subjective Pt described a situation with a lawyer and conversation passed her by.    Currently in Pain? No/denies                 ADULT SLP TREATMENT - 11/17/20 1503      General Information   Behavior/Cognition Alert;Cooperative;Pleasant mood      Treatment Provided   Treatment provided Cognitive-Linquistic      Cognitive-Linquistic Treatment   Treatment focused on  Aphasia;Patient/family/caregiver education    Skilled Treatment Given pt's "s" she and SLP practiced situation where pt needs to stop the conversation so she can state what she wants to state. SLP had pt write an item (random, via pictures) and then stop SLP as he was talking and then say what she wrote down. Pt req'd cues at first to verbally stop the "conversation", but said her item with 100% success (7/7). SLP cont'd to tell pt she would need to use all the "tools" the SLPs are showing her in order to communicate more easily. SLP encouraged pt to cont to practice even after d/c so that she can keep her conversational fluidity. In today's session pt used two different compensatory strategies for anomia.      Assessment / Recommendations / Plan   Plan Other (Comment)   d/c in 2-4 visits     Progression Toward Goals   Progression toward goals Progressing toward goals            SLP Education - 11/17/20 1554    Education Details keep practicing after d/c, writing word for a cue in the middle of a conversation so that she can make her point    Person(s) Educated Patient    Methods Explanation;Demonstration            SLP Short Term Goals - 11/10/20 1518      SLP SHORT TERM GOAL #1   Title Pt will name 10 items in a simple category with occasional min A over 2 sessions    Baseline 10-19-20, 10-23-20    Time --    Period --    Status Achieved      SLP SHORT TERM GOAL #2   Title Pt will write a word or word approximation to compensate for verbal expression with rare min A for accuracy in 2 sessions    Baseline 10-23-20, 11-10-20    Time --    Period Weeks    Status Achieved      SLP SHORT TERM GOAL #3   Title pt will complete a communication-related quality of life measure in the first therapy session    Status Achieved            SLP Long Term Goals - 11/17/20 1620      SLP LONG TERM GOAL #1   Title Pt will successfully write a word or word approximation to compensate for  verbal expression in 2 sessions    Baseline 11-10-20, 11-17-20    Status Achieved      SLP LONG TERM GOAL #2   Title pt will particpate in 8 minutes simple conversation with compensations for verbal expression in 3 sessions    Baseline 11-10-20, 11-14-20, 11-17-20    Period Weeks    Status Achieved      SLP LONG TERM GOAL #3   Title Pt will report using appropriate gesture or short phrase to indicate to  family in a moment of difficulty that rushing her speech is not helpful to her, 3 times   as a response to son's request to "spit it out"   Baseline 11-10-20, 11-14-20, 11-17-20    Status Achieved      SLP LONG TERM GOAL #4   Title pt will indicate a greater quality of life in the last 2 sessions, compared to session #1    Time 3    Period Weeks    Status On-going            Plan - 11/17/20 1617    Clinical Impression Statement Brittany [name] presents with few word finding deficits and min-mod dysfluency due to anomia. Pt able to independently self-correct anomia now more frequently than at eval, to minimize communication breakdowns. Pt stated today she is better able to communicate in all facets of her life than she was at time of eval. See "skilled intervention" for more details of today's session. Skilled ST intervention recommended to address aphasia to enhance QOL and communication effectiveness. Probable d/c in 2-4 sessions    Speech Therapy Frequency 1x /week    Duration Other (comment)   6 weeks or 13 total visits   Treatment/Interventions Compensatory strategies;Functional tasks;Environmental controls;Multimodal communcation approach;SLP instruction and feedback    Potential to Achieve Goals Good    Potential Considerations Previous level of function;Family/community support    SLP Home Exercise Plan Compensations, Talk Path    Consulted and Agree with Plan of Care Patient           Patient will benefit from skilled therapeutic intervention in order to improve the following deficits and  impairments:   Aphasia    Problem List Patient Active Problem List   Diagnosis Date Noted  . Bilateral high frequency sensorineural hearing loss 02/25/2019  . Non compliance with medical treatment 03/23/2018  . History of tobacco use disorder 01/01/2018  . Syncope and collapse 01/01/2018  . 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 Acute right hemiparesis (HCC) 12/12/2017  . History of completed stroke 12/12/2017  . Leukocytosis 12/12/2017  . Hypokalemia 12/12/2017  . Hypotension   . 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 Poor compliance with CPAP treatment 03/04/2017  . OSA and COPD overlap syndrome (HCC) 03/04/2017  . Simple chronic bronchitis (HCC) 03/04/2017  . MCI (mild cognitive impairment) 11/27/2016  . Left sided lacunar infarction (HCC) 03/11/2014  . Cerebrovascular small vessel disease 03/11/2014  . Insomnia, idiopathic 12/20/2013  . Snoring 12/06/2013  . Essential hypertension, benign 10/26/2012  . Hyperlipidemia LDL goal <70 10/26/2012  . Polypharmacy 10/26/2012  . DM (diabetes mellitus) (HCC) 10/13/2011  . Tremor 10/13/2011  . GERD (gastroesophageal reflux disease) 10/13/2011  . Depression 10/13/2011    [facility] ,MS, CCC-SLP  11/17/2020, 4:28 PM  [facility] [address] [place], [place], [address] Phone: [phone]   Fax:  [phone]   Name: Brittany [name] MRN: [id number] Date of Birth: 09-24-1960

## 2020-11-21 ENCOUNTER — Telehealth: Payer: Self-pay | Admitting: Family Medicine

## 2020-11-21 ENCOUNTER — Other Ambulatory Visit: Payer: Self-pay

## 2020-11-21 DIAGNOSIS — E785 Hyperlipidemia, unspecified: Secondary | ICD-10-CM

## 2020-11-21 MED ORDER — ATORVASTATIN CALCIUM 40 MG PO TABS: 40.0000 mg | ORAL_TABLET | Freq: Every day | ORAL | 1 refills | Status: DC

## 2020-11-21 NOTE — Telephone Encounter (Signed)
Completed.

## 2020-11-21 NOTE — Telephone Encounter (Signed)
Medication not on current list  

## 2020-11-21 NOTE — Telephone Encounter (Signed)
Medication: atorvastatin (LIPITOR) 40 MG tablet   Has the pt contacted their pharmacy? Yes  But this was written for a year by Dr Leretha Pol on 11/22/19  Preferred pharmacy:  [id number] - [place], [place] - [address] AT [facility] OF [address] Please be advised refills may take up to 3 business days.  We ask that you follow up with your pharmacy.

## 2020-11-24 ENCOUNTER — Ambulatory Visit: Payer: Medicare PPO

## 2020-11-24 ENCOUNTER — Other Ambulatory Visit: Payer: Self-pay

## 2020-11-24 DIAGNOSIS — R4701 Aphasia: Secondary | ICD-10-CM

## 2020-11-24 NOTE — Therapy (Signed)
[facility] [address] [place], [place], [address] Phone: [phone]   Fax:  [phone]  Speech Language Pathology Treatment  Patient Details  Name: Brittany [name] MRN: [id number] Date of Birth: Sep 02, 1961 Referring Provider (SLP): Janeece Agee, NP   Encounter Date: 11/24/2020   End of Session - 11/24/20 1413    Visit Number 8    Number of Visits 13    Date for SLP Re-Evaluation 01/05/21    SLP Start Time 1400    SLP Stop Time  1445    SLP Time Calculation (min) 45 min    Activity Tolerance Patient tolerated treatment well           Past Medical History:  Diagnosis Date  . Asthma   . Cataract    Phreesia 10/03/2020  . Chronic lower back pain   . Depression   . Depression    Phreesia 10/03/2020  . Diabetes mellitus without complication (HCC)    Phreesia 10/03/2020  . Family history of adverse reaction to anesthesia    Mother has trouble coming out of anesthesia; patient reports she has no problems  . Fibromyalgia   . High cholesterol   . Hypertension    Phreesia 10/03/2020  . Presence of permanent cardiac pacemaker   . Seizures (HCC) 11/2017   "day after she came home from hospital after having stroke" (01/01/2018)  . Stroke [facility]) 12/06/2017   "speech issues since" (01/01/2018)  . Type II diabetes mellitus (HCC)     Past Surgical History:  Procedure Laterality Date  . CHOLECYSTECTOMY    . EYE SURGERY N/A    Phreesia 10/03/2020  . GAS/FLUID EXCHANGE Right 12/12/2019   Procedure: Gas/Fluid Exchange;  Surgeon: Carmela Rima, MD;  Location: [facility] OR;  Service: Ophthalmology;  Laterality: Right;  . INSERT / REPLACE / REMOVE PACEMAKER  01/01/2018  . IR CT HEAD LTD  12/06/2017  . IR PERCUTANEOUS ART THROMBECTOMY/INFUSION INTRACRANIAL INC DIAG ANGIO  12/06/2017  . LOOP RECORDER INSERTION N/A 12/12/2017   Procedure: LOOP RECORDER INSERTION;  Surgeon: Regan Lemming, MD;  [facility];  Service: Cardiovascular;  Laterality: N/A;  . LOOP RECORDER REMOVAL  01/01/2018  . LOOP RECORDER REMOVAL N/A 01/01/2018   Procedure: LOOP RECORDER REMOVAL;  Surgeon: Regan Lemming, MD;  [facility];  Service: Cardiovascular;  Laterality: N/A;  . NECK SURGERY    . PACEMAKER IMPLANT N/A 01/01/2018   Procedure: PACEMAKER IMPLANT;  Surgeon: Regan Lemming, MD;  [facility];  Service: Cardiovascular;  Laterality: N/A;  . PHOTOCOAGULATION Right 12/12/2019   Procedure: Photocoagulation;  Surgeon: Carmela Rima, MD;  Location: [facility] OR;  Service: Ophthalmology;  Laterality: Right;  . RADIOLOGY WITH ANESTHESIA N/A 12/06/2017   Procedure: CODE STROKE;  Surgeon: Julieanne Cotton, MD;  Location: [facility] OR;  Service: Radiology;  Laterality: N/A;  . SCLERAL BUCKLE WITH POSSIBLE 25 GAUGE PARS PLANA VITRECTOMY Right 12/12/2019   Procedure: SCLERAL BUCKLE WITH  PARS PLANA VITRECTOMY RETINAL DETACHMENT REPAIR;  Surgeon: Carmela Rima, MD;  Location: [facility] OR;  Service: Ophthalmology;  Laterality: Right;  . TEE WITHOUT CARDIOVERSION N/A 12/10/2017   Procedure: TRANSESOPHAGEAL ECHOCARDIOGRAM (TEE);  Surgeon: Jake Bathe, MD;  Location: [name] ENDOSCOPY;  Service: Cardiovascular;  Laterality: N/A;  . THROMBECTOMY FEMORAL ARTERY Right 12/07/2017   Procedure: THROMBECTOMY RIGHT FEMORAL ARTERY;  Surgeon: Maeola Harman, MD;  Location: [name] OR;  Service: Vascular;  Laterality: Right;  . TONSILLECTOMY  There were no vitals filed for this visit.   Subjective Assessment - 11/24/20 1402    Subjective "I'm doing good"    Currently in Pain? No/denies                 ADULT SLP TREATMENT - 11/24/20 1403      General Information   Behavior/Cognition Alert;Cooperative;Pleasant mood      Treatment Provided   Treatment provided Cognitive-Linquistic      Cognitive-Linquistic Treatment   Treatment focused on Aphasia;Patient/family/caregiver education     Skilled Treatment SLP reviewed word finding compensations. Pt able to utilize writing, gestures, and descriptions with min A and additional processing time. SLP introduced use of synonyms, in which pt able to generate synonyms for 5/13 opportunities independently. Accuracy improved to 8/13 synonyms with mod cues (mostly carrier phrases). SLP trialed use of stopping speaker to request time to speak.Task scaffolded and modified as pt unable to stop speaker to name different words despite visual aid. Pt able to use gesture with mod fading to min A in general conversation.      Assessment / Recommendations / Plan   Plan Continue with current plan of care      Progression Toward Goals   Progression toward goals Progressing toward goals            SLP Education - 11/24/20 1417    Education Details writing, synonym strategy, requesting time to answer    Person(s) Educated Patient    Methods Explanation;Demonstration    Comprehension Verbalized understanding;Returned demonstration            SLP Short Term Goals - 11/10/20 1518      SLP SHORT TERM GOAL #1   Title Pt will name 10 items in a simple category with occasional min A over 2 sessions    Baseline 10-19-20, 10-23-20    Time --    Period --    Status Achieved      SLP SHORT TERM GOAL #2   Title Pt will write a word or word approximation to compensate for verbal expression with rare min A for accuracy in 2 sessions    Baseline 10-23-20, 11-10-20    Time --    Period Weeks    Status Achieved      SLP SHORT TERM GOAL #3   Title pt will complete a communication-related quality of life measure in the first therapy session    Status Achieved            SLP Long Term Goals - 11/24/20 1414      SLP LONG TERM GOAL #1   Title Pt will successfully write a word or word approximation to compensate for verbal expression in 2 sessions    Baseline 11-10-20, 11-17-20    Status Achieved      SLP LONG TERM GOAL #2   Title pt will particpate  in 8 minutes simple conversation with compensations for verbal expression in 3 sessions    Baseline 11-10-20, 11-14-20, 11-17-20    Period Weeks    Status Achieved      SLP LONG TERM GOAL #3   Title Pt will report using appropriate gesture or short phrase to indicate to family in a moment of difficulty that rushing her speech is not helpful to her, 3 times   as a response to son's request to "spit it out"   Baseline 11-10-20, 11-14-20, 11-17-20    Status Achieved      SLP LONG TERM GOAL #  4   Title pt will indicate a greater quality of life in the last 2 sessions, compared to session #1    Time 3    Period Weeks    Status On-going            Plan - 11/24/20 1446    Clinical Impression Statement Yzabella presents with few word finding deficits and min-mod dysfluency due to anomia. Pt able to independently self-correct anomia now more frequently than at eval, to minimize communication breakdowns. Pt stated today she is better able to communicate in all facets of her life than she was at time of eval. Pt reports benefit of pausing listeners in order to verbalize her thoughts with additional time. SLP reviewed word finding conversations. See "skilled intervention" for more details of today's session. Skilled ST intervention recommended to address aphasia to enhance QOL and communication effectiveness. Probable d/c in 2-4 sessions    Speech Therapy Frequency 1x /week    Duration Other (comment)   6 wks or 13 total visits   Treatment/Interventions Compensatory strategies;Functional tasks;Environmental controls;Multimodal communcation approach;SLP instruction and feedback    Potential to Achieve Goals Good    Potential Considerations Previous level of function;Family/community support    SLP Home Exercise Plan Compensations, Talk Path    Consulted and Agree with Plan of Care Patient           Patient will benefit from skilled therapeutic intervention in order to improve the following deficits and  impairments:   Aphasia    Problem List Patient Active Problem List   Diagnosis Date Noted  . Bilateral high frequency sensorineural hearing loss 02/25/2019  . Non compliance with medical treatment 03/23/2018  . History of tobacco use disorder 01/01/2018  . Syncope and collapse 01/01/2018  . 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 Acute right hemiparesis (HCC) 12/12/2017  . History of completed stroke 12/12/2017  . Leukocytosis 12/12/2017  . Hypokalemia 12/12/2017  . Hypotension   . 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 Poor compliance with CPAP treatment 03/04/2017  . OSA and COPD overlap syndrome (HCC) 03/04/2017  . Simple chronic bronchitis (HCC) 03/04/2017  . MCI (mild cognitive impairment) 11/27/2016  . Left sided lacunar infarction (HCC) 03/11/2014  . Cerebrovascular small vessel disease 03/11/2014  . Insomnia, idiopathic 12/20/2013  . Snoring 12/06/2013  . Essential hypertension, benign 10/26/2012  . Hyperlipidemia LDL goal <70 10/26/2012  . Polypharmacy 10/26/2012  . DM (diabetes mellitus) (HCC) 10/13/2011  . Tremor 10/13/2011  . GERD (gastroesophageal reflux disease) 10/13/2011  . Depression 10/13/2011    Janann Colonel, MA CCC-SLP 11/24/2020, 2:59 PM  [facility] [address] [place], [place], [address] Phone: [phone]   Fax:  [phone]   Name: TONIANN DICKERSON MRN: [id number] Date of Birth: 1960/12/06

## 2020-11-30 ENCOUNTER — Other Ambulatory Visit: Payer: Self-pay

## 2020-11-30 ENCOUNTER — Ambulatory Visit: Payer: Medicare PPO

## 2020-11-30 DIAGNOSIS — R4701 Aphasia: Secondary | ICD-10-CM

## 2020-11-30 NOTE — Therapy (Signed)
[facility] [address] [place], [place], [address] Phone: [phone]   Fax:  [phone]  Speech Language Pathology Treatment- Discharge Summary  Patient Details  Name: Brittany [name] MRN: [id number] Date of Birth: 03/11/1961 Referring Provider (SLP): Brittany Coss, NP   Encounter Date: 11/30/2020   End of Session - 11/30/20 1509    Visit Number 9    Number of Visits 13    Date for SLP Re-Evaluation 01/05/21    SLP Start Time 7681    SLP Stop Time  1600    SLP Time Calculation (min) 45 min    Activity Tolerance Patient tolerated treatment well          SPEECH THERAPY DISCHARGE SUMMARY  Visits from Start of Care: 9  Current functional level related to goals / functional outcomes: [name] presents with improved expressive aphasia since ST evaluation. Pt is ~ 3 years post-CVA; however, pt has demonstrated ability to compensate for aphasia with use of trained word finding compensations. Pt continues to require intermittent additional time to verbalize, in which SLP trained patient on techniques to cue family/listeners to provide time for her to verbalize her thoughts. Pt reports overall improvements related to speech and is pleased with her progress. Pt agreed to ST discharge at this time as all ST goals have been met.    Remaining deficits: Prolonged processing time, word finding deficits    Education / Equipment: Word finding compensations, techniques to cue family members, functional communication opportunities, aphasia apps Plan: Patient agrees to discharge.  Patient goals were met. Patient is being discharged due to meeting the stated rehab goals.  ?????       Past Medical History:  Diagnosis Date  . Asthma   . Cataract    Phreesia 10/03/2020  . Chronic lower back pain   . Depression   . Depression    Phreesia 10/03/2020  . Diabetes mellitus without complication ([place])    Phreesia 10/03/2020  .  Family history of adverse reaction to anesthesia    Mother has trouble coming out of anesthesia; patient reports she has no problems  . Fibromyalgia   . High cholesterol   . Hypertension    Phreesia 10/03/2020  . Presence of permanent cardiac pacemaker   . Seizures ([place]) 11/2017   "day after she came home from hospital after having stroke" (01/01/2018)  . Stroke [facility]) 12/06/2017   "speech issues since" (01/01/2018)  . Type II diabetes mellitus ([place])     Past Surgical History:  Procedure Laterality Date  . CHOLECYSTECTOMY    . EYE SURGERY N/A    Phreesia 10/03/2020  . GAS/FLUID EXCHANGE Right 12/12/2019   Procedure: Gas/Fluid Exchange;  Surgeon: Jalene Mullet, MD;  Location: [facility];  Service: Ophthalmology;  Laterality: Right;  . INSERT / REPLACE / REMOVE PACEMAKER  01/01/2018  . IR CT HEAD LTD  12/06/2017  . IR PERCUTANEOUS ART THROMBECTOMY/INFUSION INTRACRANIAL INC DIAG ANGIO  12/06/2017  . LOOP RECORDER INSERTION N/A 12/12/2017   Procedure: LOOP RECORDER INSERTION;  Surgeon: Constance Haw, MD;  Location: [facility];  Service: Cardiovascular;  Laterality: N/A;  . LOOP RECORDER REMOVAL  01/01/2018  . LOOP RECORDER REMOVAL N/A 01/01/2018   Procedure: LOOP RECORDER REMOVAL;  Surgeon: Constance Haw, MD;  Location: [place] CV [facility];  Service: Cardiovascular;  Laterality: N/A;  . NECK SURGERY    . PACEMAKER IMPLANT N/A 01/01/2018   Procedure: PACEMAKER IMPLANT;  Surgeon: Constance Haw,  MD;  Location: [place] [facility];  Service: Cardiovascular;  Laterality: N/A;  . PHOTOCOAGULATION Right 12/12/2019   Procedure: Photocoagulation;  Surgeon: Jalene Mullet, MD;  Location: [facility];  Service: Ophthalmology;  Laterality: Right;  . RADIOLOGY WITH ANESTHESIA N/A 12/06/2017   Procedure: CODE STROKE;  Surgeon: Luanne Bras, MD;  Location: [facility];  Service: Radiology;  Laterality: N/A;  . SCLERAL BUCKLE WITH POSSIBLE 25 GAUGE PARS PLANA VITRECTOMY Right 12/12/2019    Procedure: SCLERAL BUCKLE WITH  PARS PLANA VITRECTOMY RETINAL DETACHMENT REPAIR;  Surgeon: Jalene Mullet, MD;  Location: [facility];  Service: Ophthalmology;  Laterality: Right;  . TEE WITHOUT CARDIOVERSION N/A 12/10/2017   Procedure: TRANSESOPHAGEAL ECHOCARDIOGRAM (TEE);  Surgeon: Jerline Pain, MD;  Location: [facility] ENDOSCOPY;  Service: Cardiovascular;  Laterality: N/A;  . THROMBECTOMY FEMORAL ARTERY Right 12/07/2017   Procedure: THROMBECTOMY RIGHT FEMORAL ARTERY;  Surgeon: Waynetta Sandy, MD;  Location: [facility];  Service: Vascular;  Laterality: Right;  . TONSILLECTOMY      There were no vitals filed for this visit.   Subjective Assessment - 11/30/20 1516    Subjective "the pollen is driving everyone crazy"    Currently in Pain? No/denies                 ADULT SLP TREATMENT - 11/30/20 1519      General Information   Behavior/Cognition Alert;Cooperative;Pleasant mood      Treatment Provided   Treatment provided Cognitive-Linquistic      Cognitive-Linquistic Treatment   Treatment focused on Aphasia;Patient/family/caregiver education    Skilled Treatment SLP provided additional resources to utilize s/p ST discharge. SLP provided aphasia apps to trial, recommendation to attend [facility] aphasia group, and opportunities to pratice speaking at home. Pt completed Communication Effectiveness Survey (21) and Communication Short Form (13)  this session, indicating qualitative improvements in communication since beginning of ST. SLP completed discharge summary and reviewed with patient, in which pt verbalized understanding and agreement. Pt denied further questions.      Assessment / Recommendations / Plan   Plan Continue with current plan of care      Progression Toward Goals   Progression toward goals Progressing toward goals            SLP Education - 11/30/20 1531    Education Details apps to trial, opportunities to practice speaking, aphasia groups    Person(s) Educated Patient     Methods Explanation;Demonstration;Handout    Comprehension Verbalized understanding;Returned demonstration;Need further instruction            SLP Short Term Goals - 11/10/20 1518      SLP SHORT TERM GOAL #1   Title Pt will name 10 items in a simple category with occasional min A over 2 sessions    Baseline 10-19-20, 10-23-20    Time --    Period --    Status Achieved      SLP SHORT TERM GOAL #2   Title Pt will write a word or word approximation to compensate for verbal expression with rare min A for accuracy in 2 sessions    Baseline 10-23-20, 11-10-20    Time --    Period Weeks    Status Achieved      SLP SHORT TERM GOAL #3   Title pt will complete a communication-related quality of life measure in the first therapy session    Status Achieved            SLP Long Term Goals - 11/30/20 1545  SLP LONG TERM GOAL #1   Title Pt will successfully write a word or word approximation to compensate for verbal expression in 2 sessions    Baseline 11-10-20, 11-17-20    Status Achieved      SLP LONG TERM GOAL #2   Title pt will particpate in 8 minutes simple conversation with compensations for verbal expression in 3 sessions    Baseline 11-10-20, 11-14-20, 11-17-20    Period Weeks    Status Achieved      SLP LONG TERM GOAL #3   Title Pt will report using appropriate gesture or short phrase to indicate to family in a moment of difficulty that rushing her speech is not helpful to her, 3 times   as a response to son's request to "spit it out"   Baseline 11-10-20, 11-14-20, 11-17-20    Status Achieved      SLP LONG TERM GOAL #4   Title pt will indicate a greater quality of life in the last 2 sessions, compared to session #1    Status Achieved             Patient will benefit from skilled therapeutic intervention in order to improve the following deficits and impairments:   Aphasia    Problem List Patient Active Problem List   Diagnosis Date Noted  . Bilateral high frequency  sensorineural hearing loss 02/25/2019  . Non compliance with medical treatment 03/23/2018  . History of tobacco use disorder 01/01/2018  . Syncope and collapse 01/01/2018  . 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 cerebrovascular accident (CVA) ([place]) 12/12/2017  . Acute right hemiparesis ([place]) 12/12/2017  . History of completed stroke 12/12/2017  . Leukocytosis 12/12/2017  . Hypokalemia 12/12/2017  . Hypotension   . 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 Poor compliance with CPAP treatment 03/04/2017  . OSA and COPD overlap syndrome ([place]) 03/04/2017  . Simple chronic bronchitis ([place]) 03/04/2017  . MCI (mild cognitive impairment) 11/27/2016  . Left sided lacunar infarction ([place]) 03/11/2014  . Cerebrovascular small vessel disease 03/11/2014  . Insomnia, idiopathic 12/20/2013  . Snoring 12/06/2013  . Essential hypertension, benign 10/26/2012  . Hyperlipidemia LDL goal <70 10/26/2012  . Polypharmacy 10/26/2012  . DM (diabetes mellitus) ([place]) 10/13/2011  . Tremor 10/13/2011  . GERD (gastroesophageal reflux disease) 10/13/2011  . Depression 10/13/2011    Alinda Deem, MA CCC-SLP 11/30/2020, 3:49 PM  [facility] [address] [place], [place], [address] Phone: [phone]   Fax:  [phone]   Name: Brittany [name] MRN: [id number] Date of Birth: 07-12-1961

## 2020-11-30 NOTE — Patient Instructions (Addendum)
  Apps to trial: - Constant Therapy (free for 14 days; Discount Code for 15% off subscription: GROW) - Language Therapy Lite (limited exercises) -TalkPath Therapy (unable to find?)     C.A.T. is a facilitated aphasia conversation group led by speech-language pathology faculty members of [facility]. We serve people with aphasia in the [place] area of [place]. We meet by Zoom on the 1st and 3rd Tuesday of each month from 4:30-6:00pm ET.  Please email for more information and/or to receive a Zoom link.  Contact: Delbert Phenix, M.A., CCC-SLP or Cynda Familia, PhD, CCC-SLP  Email: [email or web address] or [email or web address]  Contact [facility] Department of [employer] and Disorders [email or web address] [phone]  **Say you were referred by [facility]

## 2020-12-07 ENCOUNTER — Ambulatory Visit: Payer: Medicare PPO

## 2021-01-02 ENCOUNTER — Ambulatory Visit (INDEPENDENT_AMBULATORY_CARE_PROVIDER_SITE_OTHER): Payer: Medicare PPO

## 2021-01-02 DIAGNOSIS — I442 Atrioventricular block, complete: Secondary | ICD-10-CM | POA: Diagnosis not present

## 2021-01-02 LAB — CUP PACEART REMOTE DEVICE CHECK
Battery Remaining Longevity: 147 mo
Battery Voltage: 3.03 V
Brady Statistic AP VP Percent: 0.35 %
Brady Statistic AP VS Percent: 0.29 %
Brady Statistic AS VP Percent: 1.32 %
Brady Statistic AS VS Percent: 98.04 %
Brady Statistic RA Percent Paced: 0.64 %
Brady Statistic RV Percent Paced: 1.68 %
Date Time Interrogation Session: 20220426004135
Implantable Lead Implant Date: 20190425
Implantable Lead Implant Date: 20190425
Implantable Lead Location: 753859
Implantable Lead Location: 753860
Implantable Lead Model: 5076
Implantable Lead Model: 5076
Implantable Pulse Generator Implant Date: 20190425
Lead Channel Impedance Value: 342 Ohm
Lead Channel Impedance Value: 361 Ohm
Lead Channel Impedance Value: 399 Ohm
Lead Channel Impedance Value: 513 Ohm
Lead Channel Pacing Threshold Amplitude: 0.75 V
Lead Channel Pacing Threshold Amplitude: 0.75 V
Lead Channel Pacing Threshold Pulse Width: 0.4 ms
Lead Channel Pacing Threshold Pulse Width: 0.4 ms
Lead Channel Sensing Intrinsic Amplitude: 12.625 mV
Lead Channel Sensing Intrinsic Amplitude: 12.625 mV
Lead Channel Sensing Intrinsic Amplitude: 3.25 mV
Lead Channel Sensing Intrinsic Amplitude: 3.25 mV
Lead Channel Setting Pacing Amplitude: 1.5 V
Lead Channel Setting Pacing Amplitude: 2.5 V
Lead Channel Setting Pacing Pulse Width: 0.4 ms
Lead Channel Setting Sensing Sensitivity: 2 mV

## 2021-01-03 ENCOUNTER — Other Ambulatory Visit: Payer: Self-pay

## 2021-01-03 ENCOUNTER — Ambulatory Visit (INDEPENDENT_AMBULATORY_CARE_PROVIDER_SITE_OTHER): Payer: Medicare PPO | Admitting: Endocrinology

## 2021-01-03 VITALS — BP 110/64 | HR 94 | Ht 63.0 in | Wt 184.0 lb

## 2021-01-03 DIAGNOSIS — E1149 Type 2 diabetes mellitus with other diabetic neurological complication: Secondary | ICD-10-CM

## 2021-01-03 DIAGNOSIS — Z794 Long term (current) use of insulin: Secondary | ICD-10-CM

## 2021-01-03 LAB — POCT GLYCOSYLATED HEMOGLOBIN (HGB A1C): Hemoglobin A1C: 7.2 % — AB (ref 4.0–5.6)

## 2021-01-03 NOTE — Patient Instructions (Signed)
check your blood sugar 4 times a day: before the 3 meals, and at bedtime.  also check if you have symptoms of your blood sugar being too high or too low.  please keep a record of the readings and bring it to your next appointment here (or you can bring the meter itself).  You can write it on any piece of paper.  please call us sooner if your blood sugar goes below 70, or if you have a lot of readings over 200.   Please continue the same 2 insulins.  Please come back for a follow-up appointment in 3 months.

## 2021-01-03 NOTE — Progress Notes (Signed)
Subjective:    Patient ID: Brittany [name], female    DOB: May 05, 1961, 60 y.o.   MRN: [id number]  HPI Pt returns for f/u of diabetes mellitus: DM type: Insulin-requiring type 2 Dx'ed: 8403 Complications: CVA Therapy: insulin since 2019 GDM: 1987 DKA: never Severe hypoglycemia: never Pancreatitis: never Pancreatic imaging: normal on 2019 CT.   Lookout: husband gives pt her insulin (due to CVA--he says he knows how to use syringe and vial).   Other: she gets multiple daily injections; She declines to add another med.  Interval history: she brings her meter with her cbg's which I have reviewed today.  cbg varies from 91-224.  There is no trend throughout the day.   She never misses the insulin.  pt states she feels well in general.  . Past Medical History:  Diagnosis Date  . Asthma   . Cataract    Phreesia 10/03/2020  . Chronic lower back pain   . Depression   . Depression    Phreesia 10/03/2020  . Diabetes mellitus without complication ([place])    Phreesia 10/03/2020  . Family history of adverse reaction to anesthesia    Mother has trouble coming out of anesthesia; patient reports she has no problems  . Fibromyalgia   . High cholesterol   . Hypertension    Phreesia 10/03/2020  . Presence of permanent cardiac pacemaker   . Seizures ([place]) 11/2017   "day after she came home from hospital after having stroke" (01/01/2018)  . Stroke [facility]) 12/06/2017   "speech issues since" (01/01/2018)  . Type II diabetes mellitus ([place])     Past Surgical History:  Procedure Laterality Date  . CHOLECYSTECTOMY    . EYE SURGERY N/A    Phreesia 10/03/2020  . GAS/FLUID EXCHANGE Right 12/12/2019   Procedure: Gas/Fluid Exchange;  Surgeon: Jalene Mullet, MD;  Location: [facility];  Service: Ophthalmology;  Laterality: Right;  . INSERT / REPLACE / REMOVE PACEMAKER  01/01/2018  . IR CT HEAD LTD  12/06/2017  . IR PERCUTANEOUS ART THROMBECTOMY/INFUSION INTRACRANIAL INC DIAG ANGIO  12/06/2017  . LOOP RECORDER  INSERTION N/A 12/12/2017   Procedure: LOOP RECORDER INSERTION;  Surgeon: Constance Haw, MD;  Location: [facility] CV LAB;  Service: Cardiovascular;  Laterality: N/A;  . LOOP RECORDER REMOVAL  01/01/2018  . LOOP RECORDER REMOVAL N/A 01/01/2018   Procedure: LOOP RECORDER REMOVAL;  Surgeon: Constance Haw, MD;  Location: [facility] CV LAB;  Service: Cardiovascular;  Laterality: N/A;  . NECK SURGERY    . PACEMAKER IMPLANT N/A 01/01/2018   Procedure: PACEMAKER IMPLANT;  Surgeon: Constance Haw, MD;  Location: [place] CV LAB;  Service: Cardiovascular;  Laterality: N/A;  . PHOTOCOAGULATION Right 12/12/2019   Procedure: Photocoagulation;  Surgeon: Jalene Mullet, MD;  Location: [facility];  Service: Ophthalmology;  Laterality: Right;  . RADIOLOGY WITH ANESTHESIA N/A 12/06/2017   Procedure: CODE STROKE;  Surgeon: Luanne Bras, MD;  Location: [facility];  Service: Radiology;  Laterality: N/A;  . SCLERAL BUCKLE WITH POSSIBLE 25 GAUGE PARS PLANA VITRECTOMY Right 12/12/2019   Procedure: SCLERAL BUCKLE WITH  PARS PLANA VITRECTOMY RETINAL DETACHMENT REPAIR;  Surgeon: Jalene Mullet, MD;  Location: [facility];  Service: Ophthalmology;  Laterality: Right;  . TEE WITHOUT CARDIOVERSION N/A 12/10/2017   Procedure: TRANSESOPHAGEAL ECHOCARDIOGRAM (TEE);  Surgeon: Jerline Pain, MD;  Location: [facility] ENDOSCOPY;  Service: Cardiovascular;  Laterality: N/A;  . THROMBECTOMY FEMORAL ARTERY Right 12/07/2017   Procedure: THROMBECTOMY RIGHT FEMORAL ARTERY;  Surgeon: Waynetta Sandy,  MD;  Location: [facility];  Service: Vascular;  Laterality: Right;  . TONSILLECTOMY      Social History   Socioeconomic History  . Marital status: Married    Spouse name: [name]  . Number of children: 2  . Years of education: 67  . Highest education level: Not on file  Occupational History  . Not on file  Tobacco Use  . Smoking status: Current Every Day Smoker    Packs/day: 1.00    Years: 38.00    Pack years: 38.00    Types:  Cigarettes  . Smokeless tobacco: Never Used  Vaping Use  . Vaping Use: Never used  Substance and Sexual Activity  . Alcohol use: Yes    Comment: 01/01/2018 "nothing since stroke 11/2017"  . Drug use: No  . Sexual activity: Not Currently  Other Topics Concern  . Not on file  Social History Narrative   Patient is married ([name]) and lives at home with her family   Patient has two children.   Patient works at [employer].   Patient has a college education.   Patient is right-handed.   Patient drinks 3 cups of coffee M-F.      Social Determinants of Health   Financial Resource Strain: Not on file  Food Insecurity: Not on file  Transportation Needs: Not on file  Physical Activity: Not on file  Stress: Not on file  Social Connections: Not on file  Intimate Partner Violence: Not on file    Current Outpatient Medications on File Prior to Visit  Medication Sig Dispense Refill  . Accu-Chek Softclix Lancets lancets 1 each by Other route 4 (four) times daily. E11.9 100 each 12  . acetaminophen (TYLENOL) 500 MG tablet Take 1,000 mg by mouth every 6 (six) hours as needed for fever (pain).    Marland Kitchen atorvastatin (LIPITOR) 40 MG tablet Take 1 tablet (40 mg total) by mouth daily. 90 tablet 1  . B Complex-Biotin-FA (MULTI-B COMPLEX) CAPS Take one a day ,B complex  multivitamin over the counter or use prenatal vitamin. 90 capsule 0  . BD PEN NEEDLE NANO 2ND GEN 32G X 4 MM MISC USE FOUR TIMES DAILY 400 each 3  . Blood Glucose Monitoring Suppl (ACCU-CHEK GUIDE ME) w/Device KIT 1 each by Does not apply route 4 (four) times daily. E11.9 1 kit 0  . clopidogrel (PLAVIX) 75 MG tablet Take 1 tablet (75 mg total) by mouth daily. 90 tablet 3  . glucose blood (ACCU-CHEK GUIDE) test strip 1 each by Other route 4 (four) times daily. E11.9 100 each 12  . insulin aspart (NOVOLOG FLEXPEN) 100 UNIT/ML FlexPen 3 times a day (just before each meal), 04-19-09, and pen needles 4/day 15 mL 11  . irbesartan (AVAPRO)  150 MG tablet TAKE 1 TABLET(150 MG) BY MOUTH DAILY 90 tablet 3  . LEVEMIR FLEXTOUCH 100 UNIT/ML FlexPen INJECT 18 UNITS UNDER THE SKIN AT BEDTIME 45 mL 1  . levETIRAcetam (KEPPRA) 500 MG tablet Take 1 tablet (500 mg total) by mouth 2 (two) times daily. 180 tablet 3  . LORazepam (ATIVAN) 0.5 MG tablet Take 0.5-1 tablets (0.25-0.5 mg total) by mouth 2 (two) times daily as needed for anxiety (anxiety and tremors (social situations)). 45 tablet 0  . primidone (MYSOLINE) 250 MG tablet TAKE 1 TABLET(250 MG) BY MOUTH DAILY 30 tablet 6  . rosuvastatin (CRESTOR) 20 MG tablet Take 1 tablet (20 mg total) by mouth daily. 90 tablet 3  . sertraline (ZOLOFT) 100 MG  tablet TAKE 2 TABLETS(200 MG) BY MOUTH AT BEDTIME 180 tablet 1   No current facility-administered medications on file prior to visit.    No Known Allergies  Family History  Problem Relation Age of Onset  . Cancer Mother   . Diabetes Mother   . Heart disease Mother   . Parkinsonism Mother   . Diabetes Father   . Heart disease Father   . Dementia Father   . Cancer Maternal Grandmother 94       lung  . COPD Maternal Aunt     BP 110/64 (BP Location: Right Arm, Patient Position: Sitting, Cuff Size: Normal)   Pulse 94   Ht 5' 3" (1.6 m)   Wt 184 lb (83.5 kg)   SpO2 94%   BMI 32.59 kg/m    Review of Systems     Objective:   Physical Exam VITAL SIGNS:  See vs page GENERAL: no distress Pulses: dorsalis pedis intact bilat.   MSK: no deformity of the feet CV: no leg edema Skin:  no ulcer on the feet.  normal color and temp on the feet. Neuro: sensation is intact to touch on the feet.   Lab Results  Component Value Date   HGBA1C 7.2 (A) 01/03/2021       Assessment & Plan:  Insulin-requiring type 2 DM: well-controlled.   Patient Instructions  check your blood sugar 4 times a day: before the 3 meals, and at bedtime.  also check if you have symptoms of your blood sugar being too high or too low.  please keep a record of the  readings and bring it to your next appointment here (or you can bring the meter itself).  You can write it on any piece of paper.  please call us sooner if your blood sugar goes below 70, or if you have a lot of readings over 200.   Please continue the same 2 insulins.  Please come back for a follow-up appointment in 3 months.

## 2021-01-24 NOTE — Progress Notes (Signed)
Remote pacemaker transmission.   

## 2021-02-09 ENCOUNTER — Other Ambulatory Visit: Payer: Self-pay | Admitting: Registered Nurse

## 2021-02-09 DIAGNOSIS — F331 Major depressive disorder, recurrent, moderate: Secondary | ICD-10-CM

## 2021-03-02 ENCOUNTER — Ambulatory Visit: Payer: Medicare PPO | Admitting: Cardiology

## 2021-03-02 ENCOUNTER — Encounter: Payer: Self-pay | Admitting: Cardiology

## 2021-03-02 ENCOUNTER — Other Ambulatory Visit: Payer: Self-pay

## 2021-03-02 ENCOUNTER — Telehealth: Payer: Self-pay

## 2021-03-02 VITALS — BP 138/82 | HR 84 | Ht 64.0 in | Wt 182.0 lb

## 2021-03-02 DIAGNOSIS — R001 Bradycardia, unspecified: Secondary | ICD-10-CM | POA: Diagnosis not present

## 2021-03-02 NOTE — Telephone Encounter (Signed)
Front desk called about patient falling outside the building that come in bleed from her knees and her right big toe. Patient seeing Dr. Elberta Fortis for a scheduled appointment. Patient was already roomed when I went to see her. Cleaned wounds with normal saline flushes along with other nurse and applied antibiotic ointment and placed 4 x 4 gauze with tape on all 3 wounds. Patient complained of pain at all sites, but tolerated wound care. Encouraged patient to clean wounds again when she got home.

## 2021-03-02 NOTE — Progress Notes (Signed)
Electrophysiology Office Note   Date:  03/02/2021   ID:  Brittany [name], Brittany [name] [birth year], MRN [id number]  PCP:  Wendie Agreste, MD  Cardiologist:   Primary Electrophysiologist:  Numan Zylstra Meredith Leeds, MD    No chief complaint on file.    History of Present Illness: Brittany [name] is a 60 y.o. female who is being seen today for the evaluation of cryptogenic stroke at the request of Wendie Agreste, MD. Presenting today for electrophysiology evaluation.    She has a history of hypertension, hyperlipidemia, prior CVA, tobacco abuse.  She was seen in the emergency room April 2019 with global aphasia.  Head CT showed an acute left insular infarct with suspected M2 embolism.  She received tPA.  She had an episode of syncope which correlated to a 3-second pause on her Linq monitor as well as junctional rhythm post pause.  She is status post [employer] dual-chamber pacemaker implanted 03/03/2018 with explant of her Linq monitor.  Today, denies symptoms of palpitations, chest pain, shortness of breath, orthopnea, PND, lower extremity edema, claudication, dizziness, presyncope, syncope, bleeding, or neurologic sequela. The patient is tolerating medications without difficulties.  Since being seen she has done well.  She has had no chest pain or shortness of breath.  She is able to all of her daily activities.  Unfortunately while walking into the office, she had a fall and hit her foot and knee.  She now has a bandage on her them   Past Medical History:  Diagnosis Date   Asthma    Cataract    Phreesia 10/03/2020   Chronic lower back pain    Depression    Depression    Phreesia 10/03/2020   Diabetes mellitus without complication ([place])    Phreesia 10/03/2020   Family history of adverse reaction to anesthesia    Mother has trouble coming out of anesthesia; patient reports she has no problems   Fibromyalgia    High cholesterol    Hypertension    Phreesia 10/03/2020   Presence of permanent  cardiac pacemaker    Seizures ([place]) 11/2017   "day after she came home from hospital after having stroke" (01/01/2018)   Stroke ([place]) 12/06/2017   "speech issues since" (01/01/2018)   Type II diabetes mellitus [facility])    Past Surgical History:  Procedure Laterality Date   CHOLECYSTECTOMY     EYE SURGERY N/A    Phreesia 10/03/2020   GAS/FLUID EXCHANGE Right 12/12/2019   Procedure: Gas/Fluid Exchange;  Surgeon: Jalene Mullet, MD;  Location: [place];  Service: Ophthalmology;  Laterality: Right;   INSERT / REPLACE / REMOVE PACEMAKER  01/01/2018   IR CT HEAD LTD  12/06/2017   IR PERCUTANEOUS ART THROMBECTOMY/INFUSION INTRACRANIAL INC DIAG ANGIO  12/06/2017   LOOP RECORDER INSERTION N/A 12/12/2017   Procedure: LOOP RECORDER INSERTION;  Surgeon: Constance Haw, MD;  Location: [place] [facility];  Service: Cardiovascular;  Laterality: N/A;   LOOP RECORDER REMOVAL  01/01/2018   LOOP RECORDER REMOVAL N/A 01/01/2018   Procedure: LOOP RECORDER REMOVAL;  Surgeon: Constance Haw, MD;  Location: [place] [facility];  Service: Cardiovascular;  Laterality: N/A;   NECK SURGERY     PACEMAKER IMPLANT N/A 01/01/2018   Procedure: PACEMAKER IMPLANT;  Surgeon: Constance Haw, MD;  Location: [place] [facility];  Service: Cardiovascular;  Laterality: N/A;   PHOTOCOAGULATION Right 12/12/2019   Procedure: Photocoagulation;  Surgeon: Jalene Mullet, MD;  Location: [place];  Service: Ophthalmology;  Laterality: Right;   RADIOLOGY WITH ANESTHESIA N/A 12/06/2017   Procedure: CODE STROKE;  Surgeon: Luanne Bras, MD;  Location: [place];  Service: Radiology;  Laterality: N/A;   SCLERAL BUCKLE WITH POSSIBLE 25 GAUGE PARS PLANA VITRECTOMY Right 12/12/2019   Procedure: SCLERAL BUCKLE WITH  PARS PLANA VITRECTOMY RETINAL DETACHMENT REPAIR;  Surgeon: Jalene Mullet, MD;  Location: [place];  Service: Ophthalmology;  Laterality: Right;   TEE WITHOUT CARDIOVERSION N/A 12/10/2017   Procedure: TRANSESOPHAGEAL ECHOCARDIOGRAM  (TEE);  Surgeon: Jerline Pain, MD;  [facility] END[facility]COPY;  Service: Cardiovascular;  Laterality: N/A;   THROMBECTOMY FEMORAL ARTERY Right 12/07/2017   Procedure: THROMBECTOMY RIGHT FEMORAL ARTERY;  Surgeon: Waynetta Sandy, MD;  Location: [place];  Service: Vascular;  Laterality: Right;   TONSILLECTOMY       Current Outpatient Medications  Medication Sig Dispense Refill   Accu-Chek Softclix Lancets lancets 1 each by Other route 4 (four) times daily. E11.9 100 each 12   acetaminophen (TYLENOL) 500 MG tablet Take 1,000 mg by mouth every 6 (six) hours as needed for fever (pain).     B Complex-Biotin-FA (MULTI-B COMPLEX) CAPS Take one a day ,B complex  multivitamin over the counter or use prenatal vitamin. 90 capsule 0   BD PEN NEEDLE NANO 2ND GEN 32G X 4 MM MISC USE FOUR TIMES DAILY 400 each 3   Blood Glucose Monitoring Suppl (ACCU-CHEK GUIDE ME) w/Device KIT 1 each by Does not apply route 4 (four) times daily. E11.9 1 kit 0   clopidogrel (PLAVIX) 75 MG tablet Take 1 tablet (75 mg total) by mouth daily. 90 tablet 3   glucose blood (ACCU-CHEK GUIDE) test strip 1 each by Other route 4 (four) times daily. E11.9 100 each 12   insulin aspart (NOVOLOG FLEXPEN) 100 UNIT/ML FlexPen 3 times a day (just before each meal), 04-19-09, and pen needles 4/day 15 mL 11   irbesartan (AVAPRO) 150 MG tablet TAKE 1 TABLET(150 MG) BY MOUTH DAILY 90 tablet 3   LEVEMIR FLEXTOUCH 100 UNIT/ML FlexPen INJECT 18 UNITS UNDER THE SKIN AT BEDTIME 45 mL 1   levETIRAcetam (KEPPRA) 500 MG tablet Take 1 tablet (500 mg total) by mouth 2 (two) times daily. 180 tablet 3   LORazepam (ATIVAN) 0.5 MG tablet Take 0.5-1 tablets (0.25-0.5 mg total) by mouth 2 (two) times daily as needed for anxiety (anxiety and tremors (social situations)). 45 tablet 0   primidone (MYSOLINE) 250 MG tablet TAKE 1 TABLET(250 MG) BY MOUTH DAILY 30 tablet 6   rosuvastatin (CRESTOR) 20 MG tablet Take 1 tablet (20 mg total) by mouth daily. 90 tablet 3    sertraline (ZOLOFT) 100 MG tablet TAKE 2 TABLETS(200 MG) BY MOUTH AT BEDTIME 180 tablet 1   No current facility-administered medications for this visit.    Allergies:   Patient has no known allergies.   Social History:  The patient  reports that she has been smoking cigarettes. She has a 38.00 pack-year smoking history. She has never used smokeless tobacco. She reports current alcohol use. She reports that she does not use drugs.   Family History:  The patient's family history includes COPD in her maternal aunt; Cancer in her mother; Cancer (age of onset: 84) in her maternal grandmother; Dementia in her father; Diabetes in her father and mother; Heart disease in her father and mother; Parkinsonism in her mother.   ROS:  Please see the history of present illness.   Otherwise, review of systems is positive for none.  All other systems are reviewed and negative.   PHYSICAL EXAM: VS:  BP 138/82   Pulse 84   Ht _0  (1.626 m)   Wt 182 lb (82.6 kg)   BMI 31.24 kg/m  , BMI Body mass index is 31.24 kg/m. GEN: Well nourished, well developed, in no acute distress  HEENT: normal  Neck: no JVD, carotid bruits, or masses Cardiac: RRR; no murmurs, rubs, or gallops,no edema  Respiratory:  clear to auscultation bilaterally, normal work of breathing GI: soft, nontender, nondistended, + BS MS: no deformity or atrophy  Skin: warm and dry, device site well healed Neuro:  Strength and sensation are intact Psych: euthymic mood, full affect  EKG:  EKG is ordered today. Personal review of the ekg ordered shows sinus rhythm, rate 84  Personal review of the device interrogation today. Results in [facility]: 06/29/2020: TSH 1.10 10/06/2020: ALT 17; BUN 11; Creatinine, Ser 0.67; Potassium 4.4; Sodium 139    Lipid Panel     Component Value Date/Time   CHOL 144 10/06/2020 1705   TRIG 139 10/06/2020 1705   HDL 45 10/06/2020 1705   CHOLHDL 3.2 10/06/2020 1705   CHOLHDL 4.0 12/07/2017  0443   VLDL 15 12/07/2017 0443   LDLCALC 75 10/06/2020 1705   LDLDIRECT 61 12/03/2013 0852     Wt Readings from Last 3 Encounters:  03/02/21 182 lb (82.6 kg)  01/03/21 184 lb (83.5 kg)  10/16/20 180 lb 9.6 oz (81.9 kg)      Other studies Reviewed: Additional studies/ records that were reviewed today include: TTE 12/08/2017 Review of the above records today demonstrates:  - Left ventricle: The cavity size was normal. Wall thickness was   normal. Systolic function was normal. The estimated ejection   fraction was in the range of 55% to 60%. Wall motion was normal;   there were no regional wall motion abnormalities. Features are   consistent with a pseudonormal left ventricular filling pattern,   with concomitant abnormal relaxation and increased filling   pressure (grade 2 diastolic dysfunction).   ASSESSMENT AND PLAN:  1.  Cryptogenic stroke: No atrial fibrillation seen on pacemaker.  No changes.    2.  Pauses with junctional rhythm and syncope: Found on Linq monitor.  Status post Linq monitor removal and pacemaker implant 03/03/2018.  Device functioning appropriately.  No changes.    3.  Hypertension:well controlled  Current medicines are reviewed at length with the patient today.   The patient does not have concerns regarding her medicines.  The following changes were made today: none  Labs/ tests ordered today include:  Orders Placed This Encounter  Procedures   EKG 12-Lead      Disposition:   FU with Paeton Latouche 74month  Signed, Lynesha Bango MMeredith Leeds MD  03/02/2021 4:01 PM     [place] [phone](office) [phone])

## 2021-04-01 ENCOUNTER — Other Ambulatory Visit: Payer: Self-pay | Admitting: Endocrinology

## 2021-04-03 ENCOUNTER — Ambulatory Visit (INDEPENDENT_AMBULATORY_CARE_PROVIDER_SITE_OTHER): Payer: Medicare PPO

## 2021-04-03 DIAGNOSIS — I442 Atrioventricular block, complete: Secondary | ICD-10-CM | POA: Diagnosis not present

## 2021-04-03 LAB — CUP PACEART REMOTE DEVICE CHECK
Battery Remaining Longevity: 144 mo
Battery Voltage: 3.03 V
Brady Statistic AP VP Percent: 0.26 %
Brady Statistic AP VS Percent: 0.25 %
Brady Statistic AS VP Percent: 1.33 %
Brady Statistic AS VS Percent: 98.17 %
Brady Statistic RA Percent Paced: 0.5 %
Brady Statistic RV Percent Paced: 1.59 %
Date Time Interrogation Session: 20220726004332
Implantable Lead Implant Date: 20190425
Implantable Lead Implant Date: 20190425
Implantable Lead Location: 753859
Implantable Lead Location: 753860
Implantable Lead Model: 5076
Implantable Lead Model: 5076
Implantable Pulse Generator Implant Date: 20190425
Lead Channel Impedance Value: 342 Ohm
Lead Channel Impedance Value: 380 Ohm
Lead Channel Impedance Value: 399 Ohm
Lead Channel Impedance Value: 475 Ohm
Lead Channel Pacing Threshold Amplitude: 0.75 V
Lead Channel Pacing Threshold Amplitude: 0.75 V
Lead Channel Pacing Threshold Pulse Width: 0.4 ms
Lead Channel Pacing Threshold Pulse Width: 0.4 ms
Lead Channel Sensing Intrinsic Amplitude: 13.625 mV
Lead Channel Sensing Intrinsic Amplitude: 13.625 mV
Lead Channel Sensing Intrinsic Amplitude: 2.875 mV
Lead Channel Sensing Intrinsic Amplitude: 2.875 mV
Lead Channel Setting Pacing Amplitude: 1.5 V
Lead Channel Setting Pacing Amplitude: 2.5 V
Lead Channel Setting Pacing Pulse Width: 0.4 ms
Lead Channel Setting Sensing Sensitivity: 2 mV

## 2021-04-05 ENCOUNTER — Other Ambulatory Visit: Payer: Self-pay

## 2021-04-05 ENCOUNTER — Encounter: Payer: Self-pay | Admitting: Family Medicine

## 2021-04-05 ENCOUNTER — Ambulatory Visit: Payer: Medicare PPO | Admitting: Family Medicine

## 2021-04-05 VITALS — BP 128/78 | HR 83 | Temp 98.3°F | Resp 16 | Ht 64.0 in | Wt 181.4 lb

## 2021-04-05 DIAGNOSIS — Z23 Encounter for immunization: Secondary | ICD-10-CM

## 2021-04-05 DIAGNOSIS — E785 Hyperlipidemia, unspecified: Secondary | ICD-10-CM | POA: Diagnosis not present

## 2021-04-05 DIAGNOSIS — I6930 Unspecified sequelae of cerebral infarction: Secondary | ICD-10-CM | POA: Diagnosis not present

## 2021-04-05 DIAGNOSIS — F17210 Nicotine dependence, cigarettes, uncomplicated: Secondary | ICD-10-CM | POA: Diagnosis not present

## 2021-04-05 DIAGNOSIS — I10 Essential (primary) hypertension: Secondary | ICD-10-CM | POA: Diagnosis not present

## 2021-04-05 DIAGNOSIS — F331 Major depressive disorder, recurrent, moderate: Secondary | ICD-10-CM

## 2021-04-05 LAB — COMPREHENSIVE METABOLIC PANEL
ALT: 11 U/L (ref 0–35)
AST: 10 U/L (ref 0–37)
Albumin: 4.3 g/dL (ref 3.5–5.2)
Alkaline Phosphatase: 127 U/L — ABNORMAL HIGH (ref 39–117)
BUN: 9 mg/dL (ref 6–23)
CO2: 26 mEq/L (ref 19–32)
Calcium: 9.5 mg/dL (ref 8.4–10.5)
Chloride: 103 mEq/L (ref 96–112)
Creatinine, Ser: 0.64 mg/dL (ref 0.40–1.20)
GFR: 95.05 mL/min (ref >60.00)
Glucose, Bld: 84 mg/dL (ref 70–99)
Potassium: 4.2 mEq/L (ref 3.5–5.1)
Sodium: 139 mEq/L (ref 135–145)
Total Bilirubin: 0.4 mg/dL (ref 0.2–1.2)
Total Protein: 7.2 g/dL (ref 6.0–8.3)

## 2021-04-05 LAB — LIPID PANEL
Cholesterol: 124 mg/dL (ref 0–200)
HDL: 50.3 mg/dL (ref >39.00)
LDL Cholesterol: 54 mg/dL (ref 0–99)
NonHDL: 73.7
Total CHOL/HDL Ratio: 2
Triglycerides: 101 mg/dL (ref 0.0–149.0)
VLDL: 20.2 mg/dL (ref 0.0–40.0)

## 2021-04-05 MED ORDER — VARENICLINE TARTRATE 0.5 MG X 11 & 1 MG X 42 PO MISC: ORAL | 0 refills | Status: DC

## 2021-04-05 MED ORDER — VARENICLINE TARTRATE 1 MG PO TABS: 1.0000 mg | ORAL_TABLET | Freq: Two times a day (BID) | ORAL | 2 refills | Status: DC

## 2021-04-05 MED ORDER — SERTRALINE HCL 100 MG PO TABS
ORAL_TABLET | ORAL | 1 refills | Status: DC
Start: 2021-04-05 — End: 2021-10-08

## 2021-04-05 NOTE — Progress Notes (Signed)
Subjective:  Patient ID: Brittany [name], female    DOB: 11/03/1960  Age: 60 y.o. MRN: [id number]  CC:  Chief Complaint  Patient presents with   Hyperlipidemia    Pt her for recheck levels and ensure medications up to date    Nicotine Dependence    Pt would like to work on quitting smoking more seriously, discussed in January but pt now feels she is able to quit    Immunizations    Discuss shingles and pneumonia     HPI Brittany [name] presents for   Diabetes followed by endocrinology, Dr. Loanne Drilling Lab Results  Component Value Date   HGBA1C 7.2 (A) 01/03/2021   History of CVA with residual deficit.  Followed by neurology including with seizure prophylaxis and Plavix  Depression: Discussed at transition of care visit with me in January.  Continued on sertraline, lorazepam as needed without recent need at that time and meeting with therapist.  Previous CVA with residual deficits.  OT was helpful at that time.  Continue Zoloft 100 mg daily.  Mood doing well, no recent counseling need. Feels like doing ok.   Depression screen [facility] 2/9 10/06/2020 08/14/2020 11/22/2019 09/03/2018 02/26/2018  Decreased Interest 0 0 0 0 (No Data)  Down, Depressed, Hopeless 0 0 0 0 -  PHQ - 2 Score 0 0 0 0 -  Altered sleeping - - - - -  Tired, decreased energy - - - - -  Change in appetite - - - - -  Feeling bad or failure about yourself  - - - - -  Trouble concentrating - - - - -  Moving slowly or fidgety/restless - - - - -  Suicidal thoughts - - - - -  PHQ-9 Score - - - - -  Difficult doing work/chores - - - - -  Some recent data might be hidden    Hyperlipidemia: Secondary prevention with previous CVA.  Crestor 20 mg daily.   Lab Results  Component Value Date   CHOL 144 10/06/2020   HDL 45 10/06/2020   LDLCALC 75 10/06/2020   LDLDIRECT 61 12/03/2013   TRIG 139 10/06/2020   CHOLHDL 3.2 10/06/2020   Lab Results  Component Value Date   ALT 17 10/06/2020   AST 15 10/06/2020   ALKPHOS 131  (H) 10/06/2020   BILITOT <0.2 10/06/2020   Hypertension: Goal 130/80 or below given prior CVA.  Also followed by cardiology with history of AV block, third-degree.  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  No atrial fibrillation to explain cryptogenic stroke on pacemaker.  Pauses with junctional rhythm and syncope found on Linq monitor, pacemaker implant 03/03/2018 with device functioning appropriately per June 24 visit, no changes.  No med changes.   Treated with irbesartan 150 mg daily. No new side effects.  Home readings:none.  BP Readings from Last 3 Encounters:  04/05/21 128/78  03/02/21 138/82  01/03/21 110/64   Lab Results  Component Value Date   CREATININE 0.67 10/06/2020   Nicotine dependence, Discussed in January.  She would like to quit at this time  previous attempt with chantix was effective without side effects. Would like to try again. 1ppd currently.   Health maintenance Shingles and Pneumovax vaccines discussed. Agreed to both.     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 12/12/2017   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 cerebrovascular accident (CVA) ([place]) 12/12/2017   Acute right hemiparesis ([place]) 12/12/2017   History of completed stroke 12/12/2017   Leukocytosis 12/12/2017   Hypokalemia 12/12/2017   Hypotension    Middle cerebral artery embolism, left 12/06/2017   Poor compliance with CPAP treatment 03/04/2017   OSA and COPD overlap syndrome ([place]) 03/04/2017   Simple chronic bronchitis ([place]) 03/04/2017   MCI (mild cognitive impairment) 11/27/2016   Left sided lacunar infarction ([place]) 03/11/2014   Cerebrovascular small  vessel disease 03/11/2014   Insomnia, idiopathic 12/20/2013   Snoring 12/06/2013   Essential hypertension, benign 10/26/2012   Hyperlipidemia LDL goal <70 10/26/2012   Polypharmacy 10/26/2012   DM (diabetes mellitus) ([place]) 10/13/2011   Tremor 10/13/2011   GERD (gastroesophageal reflux disease) 10/13/2011   Depression 10/13/2011   Past Medical History:  Diagnosis Date   Asthma    Cataract    Phreesia 10/03/2020   Chronic lower back pain    Depression    Depression    Phreesia 10/03/2020   Diabetes mellitus without complication ([place])    Phreesia 10/03/2020   Family history of adverse reaction to anesthesia    Mother has trouble coming out of anesthesia; patient reports she has no problems   Fibromyalgia    High cholesterol    Hypertension    Phreesia 10/03/2020   Presence of permanent cardiac pacemaker    Seizures ([place]) 11/2017   "day after she came home from hospital after having stroke" (01/01/2018)   Stroke ([place]) 12/06/2017   "speech issues since" (01/01/2018)   Type II diabetes mellitus [facility])    Past Surgical History:  Procedure Laterality Date   CHOLECYSTECTOMY     EYE SURGERY N/A    Phreesia 10/03/2020   GAS/FLUID EXCHANGE Right 12/12/2019   Procedure: Gas/Fluid Exchange;  Surgeon: Jalene Mullet, MD;  Location: [place];  Service: Ophthalmology;  Laterality: Right;   INSERT / REPLACE / REMOVE PACEMAKER  01/01/2018   IR CT HEAD LTD  12/06/2017   IR PERCUTANEOUS ART THROMBECTOMY/INFUSION INTRACRANIAL INC DIAG ANGIO  12/06/2017   LOOP RECORDER INSERTION N/A 12/12/2017   Procedure: LOOP RECORDER INSERTION;  Surgeon: Constance Haw, MD;  Location: [place] [facility];  Service: Cardiovascular;  Laterality: N/A;   LOOP RECORDER REMOVAL  01/01/2018   LOOP RECORDER REMOVAL N/A 01/01/2018   Procedure: LOOP RECORDER REMOVAL;  Surgeon: Constance Haw, MD;  Location: [place] [facility];  Service: Cardiovascular;  Laterality: N/A;   NECK SURGERY     PACEMAKER IMPLANT N/A  01/01/2018   Procedure: PACEMAKER IMPLANT;  Surgeon: Constance Haw, MD;  Location: [place] [facility];  Service: Cardiovascular;  Laterality: N/A;   PHOTOCOAGULATION Right 12/12/2019   Procedure: Photocoagulation;  Surgeon: Jalene Mullet, MD;  Location: [place];  Service: Ophthalmology;  Laterality: Right;   RADIOLOGY WITH ANESTHESIA N/A 12/06/2017   Procedure: CODE STROKE;  Surgeon: Luanne Bras, MD;  Location: [place];  Service: Radiology;  Laterality: N/A;   SCLERAL BUCKLE WITH POSSIBLE 25 GAUGE PARS PLANA VITRECTOMY Right 12/12/2019   Procedure: SCLERAL BUCKLE WITH  PARS PLANA VITRECTOMY RETINAL DETACHMENT REPAIR;  Surgeon: Jalene Mullet, MD;  Location: [place];  Service: Ophthalmology;  Laterality: Right;   TEE WITHOUT CARDIOVERSION N/A 12/10/2017   Procedure: TRANSESOPHAGEAL ECHOCARDIOGRAM (TEE);  Surgeon: Jerline Pain,  MD;  Location: [place];  Service: Cardiovascular;  Laterality: N/A;   THROMBECTOMY FEMORAL ARTERY Right 12/07/2017   Procedure: THROMBECTOMY RIGHT FEMORAL ARTERY;  Surgeon: Waynetta Sandy, MD;  Location: [facility];  Service: Vascular;  Laterality: Right;   TONSILLECTOMY     No Known Allergies Prior to Admission medications   Medication Sig Start Date End Date Taking? Authorizing Provider  Accu-Chek Softclix Lancets lancets 1 each by Other route 4 (four) times daily. E11.9 05/22/20   Renato Shin, MD  acetaminophen (TYLENOL) 500 MG tablet Take 1,000 mg by mouth every 6 (six) hours as needed for fever (pain).    [provider]  B Complex-Biotin-FA (MULTI-B COMPLEX) CAPS Take one a day ,B complex  multivitamin over the counter or use prenatal vitamin. 08/28/16   Dohmeier, Asencion [place], MD  BD PEN NEEDLE NANO 2ND GEN 32G X 4 MM MISC USE FOUR TIMES DAILY 09/17/20   Renato Shin, MD  Blood Glucose Monitoring Suppl (ACCU-CHEK GUIDE ME) w/Device KIT 1 each by Does not apply route 4 (four) times daily. E11.9 05/22/20   Renato Shin, MD  clopidogrel (PLAVIX) 75 MG  tablet Take 1 tablet (75 mg total) by mouth daily. 10/06/20   Wendie Agreste, MD  glucose blood (ACCU-CHEK GUIDE) test strip 1 each by Other route 4 (four) times daily. E11.9 05/22/20   Renato Shin, MD  insulin aspart (NOVOLOG FLEXPEN) 100 UNIT/ML FlexPen INJECT 8 UNITS UNDER THE SKIN EVERY MORNING, 11 UNITS BEFORE LUNCH, AND 10 UNITS EVERY EVENING BEFORE A MEAL 04/03/21   Renato Shin, MD  irbesartan (AVAPRO) 150 MG tablet TAKE 1 TABLET(150 MG) BY MOUTH DAILY 10/06/20   Wendie Agreste, MD  LEVEMIR FLEXTOUCH 100 UNIT/ML FlexPen INJECT 18 UNITS UNDER THE SKIN AT BEDTIME 10/25/20   Renato Shin, MD  levETIRAcetam (KEPPRA) 500 MG tablet Take 1 tablet (500 mg total) by mouth 2 (two) times daily. 10/16/20   Frann Rider, NP  LORazepam (ATIVAN) 0.5 MG tablet Take 0.5-1 tablets (0.25-0.5 mg total) by mouth 2 (two) times daily as needed for anxiety (anxiety and tremors (social situations)). 10/16/20   Frann Rider, NP  primidone (MYSOLINE) 250 MG tablet TAKE 1 TABLET(250 MG) BY MOUTH DAILY 09/18/20   Frann Rider, NP  rosuvastatin (CRESTOR) 20 MG tablet Take 1 tablet (20 mg total) by mouth daily. 10/06/20   Wendie Agreste, MD  sertraline (ZOLOFT) 100 MG tablet TAKE 2 TABLETS(200 MG) BY MOUTH AT BEDTIME 02/09/21   Maximiano Coss, NP   Social History   Socioeconomic History   Marital status: Married    Spouse name: [name]   Number of children: 2   Years of education: 14   Highest education level: Not on file  Occupational History   Not on file  Tobacco Use   Smoking status: Every Day    Packs/day: 1.00    Years: 38.00    Pack years: 38.00    Types: Cigarettes   Smokeless tobacco: Never  Vaping Use   Vaping Use: Never used  Substance and Sexual Activity   Alcohol use: Yes    Comment: 01/01/2018 "nothing since stroke 11/2017"   Drug use: No   Sexual activity: Not Currently  Other Topics Concern   Not on file  Social History Narrative   Patient is married ([name]) and lives at home with her  family   Patient has two children.   Patient works at [employer].   Patient has a college education.   Patient  is right-handed.   Patient drinks 3 cups of coffee M-F.      Social Determinants of Health   Financial Resource Strain: Not on file  Food Insecurity: Not on file  Transportation Needs: Not on file  Physical Activity: Not on file  Stress: Not on file  Social Connections: Not on file  Intimate Partner Violence: Not on file    Review of Systems  Constitutional:  Negative for fatigue and unexpected weight change.  Respiratory:  Negative for chest tightness and shortness of breath.   Cardiovascular:  Negative for chest pain, palpitations and leg swelling.  Gastrointestinal:  Negative for abdominal pain and blood in stool.  Neurological:  Negative for dizziness, syncope, light-headedness and headaches.       No new weakness,     Objective:   Vitals:   04/05/21 1336  BP: 128/78  Pulse: 83  Resp: 16  Temp: 98.3 F (36.8 C)  TempSrc: Temporal  SpO2: 95%  Weight: 181 lb 6.4 oz (82.3 kg)  Height: 5' 4"  (1.626 m)     Physical Exam Vitals reviewed.  Constitutional:      Appearance: Normal appearance. She is well-developed.  HENT:     Head: Normocephalic and atraumatic.  Eyes:     Conjunctiva/sclera: Conjunctivae normal.     Pupils: Pupils are equal, round, and reactive to light.  Neck:     Vascular: No carotid bruit.  Cardiovascular:     Rate and Rhythm: Normal rate and regular rhythm.     Heart sounds: Normal heart sounds.  Pulmonary:     Effort: Pulmonary effort is normal.     Breath sounds: Normal breath sounds.  Abdominal:     Palpations: Abdomen is soft. There is no pulsatile mass.     Tenderness: There is no abdominal tenderness.  Musculoskeletal:     Right lower leg: No edema.     Left lower leg: No edema.  Skin:    General: Skin is warm and dry.  Neurological:     Mental Status: She is alert and oriented to person, place, and time.   Psychiatric:        Mood and Affect: Mood normal.        Behavior: Behavior normal.       Assessment & Plan:  Brittany [name] is a 60 y.o. female . Hyperlipidemia LDL goal <70 - Plan: Lipid panel  -Near goal previously with history of CVA, continue same regimen, check labs.  History of CVA with residual deficit  -Stable, continue follow-up with neurology  Moderate episode of recurrent major depressive disorder ([place]) - Plan: sertraline (ZOLOFT) 100 MG tablet  -Reports stable control of symptoms, will continue sertraline 200 mg daily for now.  Recheck at 66-monthintervals to decide on potential lower dosing or adjustments.  Essential hypertension, benign - Plan: Comprehensive metabolic panel  -Stable control, check labs.  No med changes.  Need for vaccination against Streptococcus pneumoniae - Plan: Pneumococcal conjugate vaccine 20-valent (Prevnar 20)  Need for shingles vaccine - Plan: Varicella-zoster vaccine IM  Cigarette nicotine dependence without complication  -Ready to quit, has tolerated Chantix previously, reordered.  Meds ordered this encounter  Medications   sertraline (ZOLOFT) 100 MG tablet    Sig: TAKE 2 TABLETS(200 MG) BY MOUTH AT BEDTIME    Dispense:  180 tablet    Refill:  1   varenicline (CHANTIX CONTINUING MONTH PAK) 1 MG tablet    Sig: Take 1 tablet (1 mg total) by mouth  2 (two) times daily.    Dispense:  60 tablet    Refill:  2   varenicline (CHANTIX STARTING MONTH PAK) 0.5 MG X 11 & 1 MG X 42 tablet    Sig: Take one 0.5 mg tablet by mouth once daily for 3 days, then increase to one 0.5 mg tablet twice daily for 4 days, then increase to one 1 mg tablet twice daily.    Dispense:  53 tablet    Refill:  0   Patient Instructions  Blood pressure looks good today.  Previous diabetes test and cholesterol levels are very close to goal with history of CVA.  No med changes for now but keep follow-up with endocrinologist.  I will let you know if there are  concerns on labs today.  No other med changes at this time.  Follow-up in 6 months but let me know if there are questions sooner.   Try chantix again to help with smoking cessation.  Managing the Challenge of Quitting Smoking Quitting smoking is a physical and mental challenge. You will face cravings, withdrawal symptoms, and temptation. Before quitting, work with your health care provider to make a plan that can help you manage quitting. Preparation canhelp you quit and keep you from giving in. How to manage lifestyle changes Managing stress Stress can make you want to smoke, and wanting to smoke may cause stress. It is important to find ways to manage your stress. You might try some of the following: Practice relaxation techniques. Breathe slowly and deeply, in through your nose and out through your mouth. Listen to music. Soak in a bath or take a shower. Imagine a peaceful place or vacation. Get some support. Talk with family or friends about your stress. Join a support group. Talk with a counselor or therapist. Get some physical activity. Go for a walk, run, or bike ride. Play a favorite sport. Practice yoga.  Medicines Talk with your health care provider about medicines that might help you dealwith cravings and make quitting easier for you. Relationships Social situations can be difficult when you are quitting smoking. To manage this, you can: Avoid parties and other social situations where people might be smoking. Avoid alcohol. Leave right away if you have the urge to smoke. Explain to your family and friends that you are quitting smoking. Ask for support and let them know you might be a bit grumpy. Plan activities where smoking is not an option. General instructions Be aware that many people gain weight after they quit smoking. However, not everyone does. To keep from gaining weight, have a plan in place before you quit and stick to the plan after you quit. Your plan should  include: Having healthy snacks. When you have a craving, it may help to: Eat popcorn, carrots, celery, or other cut vegetables. Chew sugar-free gum. Changing how you eat. Eat small portion sizes at meals. Eat 4-6 small meals throughout the day instead of 1-2 large meals a day. Be mindful when you eat. Do not watch television or do other things that might distract you as you eat. Exercising regularly. Make time to exercise each day. If you do not have time for a long workout, do short bouts of exercise for 5-10 minutes several times a day. Do some form of strengthening exercise, such as weight lifting. Do some exercise that gets your heart beating and causes you to breathe deeply, such as walking fast, running, swimming, or biking. This is very important. Drinking plenty of  water or other low-calorie or no-calorie drinks. Drink 6-8 glasses of water daily.  How to recognize withdrawal symptoms Your body and mind may experience discomfort as you try to get used to not having nicotine in your system. These effects are called withdrawal symptoms. They may include: Feeling hungrier than normal. Having trouble concentrating. Feeling irritable or restless. Having trouble sleeping. Feeling depressed. Craving a cigarette. To manage withdrawal symptoms: Avoid places, people, and activities that trigger your cravings. Remember why you want to quit. Get plenty of sleep. Avoid coffee and other caffeinated drinks. These may worsen some of your symptoms. These symptoms may surprise you. But be assured that they are normal to havewhen quitting smoking. How to manage cravings Come up with a plan for how to deal with your cravings. The plan should include the following: A definition of the specific situation you want to deal with. An alternative action you will take. A clear idea for how this action will help. The name of someone who might help you with this. Cravings usually last for 5-10 minutes.  Consider taking the following actions to help you with your plan to deal with cravings: Keep your mouth busy. Chew sugar-free gum. Suck on hard candies or a straw. Brush your teeth. Keep your hands and body busy. Change to a different activity right away. Squeeze or play with a ball. Do an activity or a hobby, such as making bead jewelry, practicing needlepoint, or working with wood. Mix up your normal routine. Take a short exercise break. Go for a quick walk or run up and down stairs. Focus on doing something kind or helpful for someone else. Call a friend or family member to talk during a craving. Join a support group. Contact a quitline. Where to find support To get help or find a support group: Call the [facility]'s Smoking Quitline: 1-800-QUIT NOW 617-444-5821) Visit the website of the Substance Abuse and [place]: ktimeonline.com Text QUIT to SmokefreeTXT: 761950 Where to find more information Visit these websites to find more information on quitting smoking: [place]: www.smokefree.gov American Lung Association: www.lung.org American Cancer Society: www.cancer.org Centers for Disease Control and Prevention: http://www.wolf.info/ American Heart Association: www.heart.org Contact a health care provider if: You want to change your plan for quitting. The medicines you are taking are not helping. Your eating feels out of control or you cannot sleep. Get help right away if: You feel depressed or become very anxious. Summary Quitting smoking is a physical and mental challenge. You will face cravings, withdrawal symptoms, and temptation to smoke again. Preparation can help you as you go through these challenges. Try different techniques to manage stress, handle social situations, and prevent weight gain. You can deal with cravings by keeping your mouth busy (such as by chewing gum), keeping your hands and body busy, calling family or  friends, or contacting a quitline for people who want to quit smoking. You can deal with withdrawal symptoms by avoiding places where people smoke, getting plenty of rest, and avoiding drinks with caffeine. This information is not intended to replace advice given to you by your health care provider. Make sure you discuss any questions you have with your healthcare provider. Document Revised: 06/15/2019 Document Reviewed: 06/15/2019 Elsevier Patient Education  2022 [place],   Merri Ray, MD [place] 04/05/21 2:32 PM

## 2021-04-05 NOTE — Patient Instructions (Addendum)
Blood pressure looks good today.  Previous diabetes test and cholesterol levels are very close to goal with history of CVA.  No med changes for now but keep follow-up with endocrinologist.  I will let you know if there are concerns on labs today.  No other med changes at this time.  Follow-up in 6 months but let me know if there are questions sooner.   Try chantix again to help with smoking cessation.  Managing the Challenge of Quitting Smoking Quitting smoking is a physical and mental challenge. You will face cravings, withdrawal symptoms, and temptation. Before quitting, work with your health care provider to make a plan that can help you manage quitting. Preparation canhelp you quit and keep you from giving in. How to manage lifestyle changes Managing stress Stress can make you want to smoke, and wanting to smoke may cause stress. It is important to find ways to manage your stress. You might try some of the following: Practice relaxation techniques. Breathe slowly and deeply, in through your nose and out through your mouth. Listen to music. Soak in a bath or take a shower. Imagine a peaceful place or vacation. Get some support. Talk with family or friends about your stress. Join a support group. Talk with a counselor or therapist. Get some physical activity. Go for a walk, run, or bike ride. Play a favorite sport. Practice yoga.  Medicines Talk with your health care provider about medicines that might help you dealwith cravings and make quitting easier for you. Relationships Social situations can be difficult when you are quitting smoking. To manage this, you can: Avoid parties and other social situations where people might be smoking. Avoid alcohol. Leave right away if you have the urge to smoke. Explain to your family and friends that you are quitting smoking. Ask for support and let them know you might be a bit grumpy. Plan activities where smoking is not an option. General  instructions Be aware that many people gain weight after they quit smoking. However, not everyone does. To keep from gaining weight, have a plan in place before you quit and stick to the plan after you quit. Your plan should include: Having healthy snacks. When you have a craving, it may help to: Eat popcorn, carrots, celery, or other cut vegetables. Chew sugar-free gum. Changing how you eat. Eat small portion sizes at meals. Eat 4-6 small meals throughout the day instead of 1-2 large meals a day. Be mindful when you eat. Do not watch television or do other things that might distract you as you eat. Exercising regularly. Make time to exercise each day. If you do not have time for a long workout, do short bouts of exercise for 5-10 minutes several times a day. Do some form of strengthening exercise, such as weight lifting. Do some exercise that gets your heart beating and causes you to breathe deeply, such as walking fast, running, swimming, or biking. This is very important. Drinking plenty of water or other low-calorie or no-calorie drinks. Drink 6-8 glasses of water daily.  How to recognize withdrawal symptoms Your body and mind may experience discomfort as you try to get used to not having nicotine in your system. These effects are called withdrawal symptoms. They may include: Feeling hungrier than normal. Having trouble concentrating. Feeling irritable or restless. Having trouble sleeping. Feeling depressed. Craving a cigarette. To manage withdrawal symptoms: Avoid places, people, and activities that trigger your cravings. Remember why you want to quit. Get plenty of sleep.  Avoid coffee and other caffeinated drinks. These may worsen some of your symptoms. These symptoms may surprise you. But be assured that they are normal to havewhen quitting smoking. How to manage cravings Come up with a plan for how to deal with your cravings. The plan should include the following: A definition  of the specific situation you want to deal with. An alternative action you will take. A clear idea for how this action will help. The name of someone who might help you with this. Cravings usually last for 5-10 minutes. Consider taking the following actions to help you with your plan to deal with cravings: Keep your mouth busy. Chew sugar-free gum. Suck on hard candies or a straw. Brush your teeth. Keep your hands and body busy. Change to a different activity right away. Squeeze or play with a ball. Do an activity or a hobby, such as making bead jewelry, practicing needlepoint, or working with wood. Mix up your normal routine. Take a short exercise break. Go for a quick walk or run up and down stairs. Focus on doing something kind or helpful for someone else. Call a friend or family member to talk during a craving. Join a support group. Contact a quitline. Where to find support To get help or find a support group: Call the National Cancer Institute's Smoking Quitline: 1-800-QUIT NOW 856-575-2846) Visit the website of the Substance Abuse and Mental Health Services Administration: SkateOasis.com.pt Text QUIT to SmokefreeTXT: 790240 Where to find more information Visit these websites to find more information on quitting smoking: National Cancer Institute: www.smokefree.gov American Lung Association: www.lung.org American Cancer Society: www.cancer.org Centers for Disease Control and Prevention: FootballExhibition.com.br American Heart Association: www.heart.org Contact a health care provider if: You want to change your plan for quitting. The medicines you are taking are not helping. Your eating feels out of control or you cannot sleep. Get help right away if: You feel depressed or become very anxious. Summary Quitting smoking is a physical and mental challenge. You will face cravings, withdrawal symptoms, and temptation to smoke again. Preparation can help you as you go through these challenges. Try  different techniques to manage stress, handle social situations, and prevent weight gain. You can deal with cravings by keeping your mouth busy (such as by chewing gum), keeping your hands and body busy, calling family or friends, or contacting a quitline for people who want to quit smoking. You can deal with withdrawal symptoms by avoiding places where people smoke, getting plenty of rest, and avoiding drinks with caffeine. This information is not intended to replace advice given to you by your health care provider. Make sure you discuss any questions you have with your healthcare provider. Document Revised: 06/15/2019 Document Reviewed: 06/15/2019 Elsevier Patient Education  2022 [employer].

## 2021-04-16 ENCOUNTER — Encounter: Payer: Self-pay | Admitting: Adult Health

## 2021-04-16 ENCOUNTER — Ambulatory Visit: Payer: Medicare PPO | Admitting: Adult Health

## 2021-04-16 VITALS — BP 128/74 | HR 95 | Ht 63.0 in | Wt 181.0 lb

## 2021-04-16 DIAGNOSIS — I6930 Unspecified sequelae of cerebral infarction: Secondary | ICD-10-CM | POA: Diagnosis not present

## 2021-04-16 DIAGNOSIS — G250 Essential tremor: Secondary | ICD-10-CM | POA: Diagnosis not present

## 2021-04-16 DIAGNOSIS — I639 Cerebral infarction, unspecified: Secondary | ICD-10-CM | POA: Diagnosis not present

## 2021-04-16 DIAGNOSIS — F4323 Adjustment disorder with mixed anxiety and depressed mood: Secondary | ICD-10-CM

## 2021-04-16 DIAGNOSIS — R569 Unspecified convulsions: Secondary | ICD-10-CM | POA: Diagnosis not present

## 2021-04-16 MED ORDER — PRIMIDONE 250 MG PO TABS: 250.0000 mg | ORAL_TABLET | Freq: Every day | ORAL | 3 refills | Status: DC

## 2021-04-16 NOTE — Progress Notes (Signed)
[facility] [address] [place]. 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 s/p ILR 12/23/2017 - ILR explanted with PMR implant on 01/01/2018   Left MCA stroke 11/2017 -Residual expressive aphasia and cognitive impairment- stable -Remains on clopidogrel with mild bruising but no bleeding -Remains on Crestor 20 mg daily per PCP -recent lipid panel showed LDL 54 -Continues to follow with endocrinology for DM management with recent A1c 7.2 -Blood pressure today 128/74 - does not routinely monitor at home as typically stable -continued tobacco use but plans to start Chantix soon -PMR has not shown evidence of atrial fibrillation thus far   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 8 animals  Total score 26 29 26      Depression/anxiety Adjustment disorder -Overall stable with ongoing use of sertraline 200 mg nightly (per PCP).   -ocassional use of ativan PRN with benefit  Essential tremors -stable -Continues on primidone 250 mg daily tolerating well without side effects -Does report worsening with increased anxiety  and social situations       ROS:   14 system review of systems is positive for those listed in HPI and all other systems negative   PMH:  Past Medical History:  Diagnosis Date   Asthma    Cataract    Phreesia 10/03/2020   Chronic lower back pain    Depression    Depression    Phreesia 10/03/2020   Diabetes mellitus without complication ([place])    Phreesia 10/03/2020   Family history of adverse reaction to anesthesia    Mother has trouble coming out of anesthesia; patient reports she has no problems   Fibromyalgia    High cholesterol    Hypertension    Phreesia 10/03/2020   Presence of permanent cardiac pacemaker    Seizures ([place]) 11/2017   "day after she came home from hospital after having stroke" (01/01/2018)   Stroke ([place]) 12/06/2017   "speech issues since" (01/01/2018)   Type II diabetes mellitus ([place])     Social History:  Social History   Socioeconomic History   Marital status: Married    Spouse name: Brittany [name]   Number of children: 2   Years of education: 14   Highest education level: Not on file  Occupational History   Not on file  Tobacco Use   Smoking status: Every Day    Packs/day: 1.00    Years: 38.00    Pack years: 38.00    Types: Cigarettes   Smokeless tobacco: Never  Vaping  Use   Vaping Use: Never used  Substance and Sexual Activity   Alcohol use: Yes    Comment: 01/01/2018 "nothing since stroke 11/2017"   Drug use: No   Sexual activity: Not Currently  Other Topics Concern   Not on file  Social History Narrative   Patient is married (Brittany [name]) and lives at home with her family   Patient has two children.   Patient works at [employer].   Patient has a college education.   Patient is right-handed.   Patient drinks 3 cups of coffee M-F.      Social Determinants of Health   Financial Resource Strain: Not on file  Food Insecurity: Not on file  Transportation Needs: Not on file  Physical Activity: Not on file  Stress: Not on file   Social Connections: Not on file  Intimate Partner Violence: Not on file    Medications:   Current Outpatient Medications on File Prior to Visit  Medication Sig Dispense Refill   Accu-Chek Softclix Lancets lancets 1 each by Other route 4 (four) times daily. E11.9 100 each 12   acetaminophen (TYLENOL) 500 MG tablet Take 1,000 mg by mouth every 6 (six) hours as needed for fever (pain).     B Complex-Biotin-FA (MULTI-B COMPLEX) CAPS Take one a day ,B complex  multivitamin over the counter or use prenatal vitamin. 90 capsule 0   BD PEN NEEDLE NANO 2ND GEN 32G X 4 MM MISC USE FOUR TIMES DAILY 400 each 3   Blood Glucose Monitoring Suppl (ACCU-CHEK GUIDE ME) w/Device KIT 1 each by Does not apply route 4 (four) times daily. E11.9 1 kit 0   clopidogrel (PLAVIX) 75 MG tablet Take 1 tablet (75 mg total) by mouth daily. 90 tablet 3   glucose blood (ACCU-CHEK GUIDE) test strip 1 each by Other route 4 (four) times daily. E11.9 100 each 12   insulin aspart (NOVOLOG FLEXPEN) 100 UNIT/ML FlexPen INJECT 8 UNITS UNDER THE SKIN EVERY MORNING, 11 UNITS BEFORE LUNCH, AND 10 UNITS EVERY EVENING BEFORE A MEAL 15 mL 11   irbesartan (AVAPRO) 150 MG tablet TAKE 1 TABLET(150 MG) BY MOUTH DAILY 90 tablet 3   LEVEMIR FLEXTOUCH 100 UNIT/ML FlexPen INJECT 18 UNITS UNDER THE SKIN AT BEDTIME 45 mL 1   levETIRAcetam (KEPPRA) 500 MG tablet Take 1 tablet (500 mg total) by mouth 2 (two) times daily. 180 tablet 3   LORazepam (ATIVAN) 0.5 MG tablet Take 0.5-1 tablets (0.25-0.5 mg total) by mouth 2 (two) times daily as needed for anxiety (anxiety and tremors (social situations)). 45 tablet 0   primidone (MYSOLINE) 250 MG tablet TAKE 1 TABLET(250 MG) BY MOUTH DAILY 30 tablet 6   rosuvastatin (CRESTOR) 20 MG tablet Take 1 tablet (20 mg total) by mouth daily. 90 tablet 3   sertraline (ZOLOFT) 100 MG tablet TAKE 2 TABLETS(200 MG) BY MOUTH AT BEDTIME 180 tablet 1   varenicline (CHANTIX CONTINUING MONTH PAK) 1 MG tablet Take 1 tablet (1 mg  total) by mouth 2 (two) times daily. 60 tablet 2   varenicline (CHANTIX STARTING MONTH PAK) 0.5 MG X 11 & 1 MG X 42 tablet Take one 0.5 mg tablet by mouth once daily for 3 days, then increase to one 0.5 mg tablet twice daily for 4 days, then increase to one 1 mg tablet twice daily. 53 tablet 0   No current facility-administered medications on file prior to visit.    Allergies:  No Known Allergies  Today's Vitals  04/16/21 1231  BP: 128/74  Pulse: 95  Weight: 181 lb (82.1 kg)  Height: $Remove'5\' 3"'HNJwPGM$  (1.6 m)    Body mass index is 32.06 kg/m.   General: well developed, well nourished,  pleasant middle-age Caucasian female, seated, in no evident distress Head: head normocephalic and atraumatic.   Neck: supple with no carotid or supraclavicular bruits Cardiovascular: regular rate and rhythm, no murmurs Musculoskeletal: no deformity Skin:  no rash/petichiae Vascular:  Normal pulses all extremities   Neurologic Exam Mental Status: Awake and fully alert.  Mild to moderate expressive aphasia with speech hesitancy.  Oriented to place and time. Recent memory impaired and remote memory intact. Attention span, concentration and fund of knowledge appropriate today. Mood and affect appropriate throughout visit Cranial Nerves: Pupils equal, briskly reactive to light. Extraocular movements full without nystagmus. Visual fields full to confrontation. Hearing intact. Facial sensation intact. Face, tongue, palate moves normally and symmetrically.  Motor: Normal bulk and tone. Normal strength in all tested extremity muscles.  Sensory.: intact to touch , pinprick , position and vibratory sensation.  Coordination: Mild tremor head and upper arms R>L with outstretched arms and amplified RUE finger-to-nose testing.  No evidence of lower extremity tremor or resting tremor.  No evidence of cogwheel rigidity. Gait and Station: Arises from chair without difficulty. Stance is normal. Gait demonstrates normal stride  length and balance without use of assistive device Reflexes: 1+ and symmetric. Toes downgoing.      ASSESSMENT/PLAN: 60 year old lady with aphasia secondary to left MCA infarct in April 2019 of cryptogenic etiology treated with IV TPA and mechanical thrombectomy with good recanalization. She also had symptomatic seizure after discharge and residual stroke deficit of expressive aphasia and cognitive impairment currently receiving long-term disability.  Restarted on primidone due to essential tremors which has been stable.  Great difficulty with poststroke depression/anxiety and adjustment disorder which has since stabilized    Left MCA stroke -Continue Plavix and Crestor for secondary stroke prevention -Close PCP follow-up for aggressive stroke risk factor management including HTN, HLD and DM -pacer routinely monitored by cardiology -negative for atrial fibrillation -HLD: LDL goal<70.  Recent LDL 54 (03/2021).  On Crestor 20 mg daily per PCP -HTN: BP goal <130/90. Stable today. Continue current regimen and routine follow-up with PCP -DM: A1c goal<7.0.  Recent A1c 7.2 (12/2020).  Routinely monitored by PCP  Post stroke seizure -Continue Keppra 500 mg twice daily for seizure prophylaxis - refill up to date -No recurrent seizure episodes.  Advised to call with any seizure symptoms or events  Essential tremors -Continue primidone 250 mg daily -refill provided -Worse with increased anxiety and social situations - recommend trialing low-dose Ativan (as already prescribed and tolerates without side effects) prior to social events or increased stressful situation for possible benefit.  Depression/anxiety disorder Adjustment disorder -Continue Zoloft 200 mg daily (managed by PCP) and Ativan as needed (refill not needed per pt) -Routinely monitored by PCP     Follow-up in 6 months months or call earlier if needed   CC:  [facility] provider: Dr. Suzi Roots, Ranell Patrick, MD     I spent 37  minutes of face-to-face and non-face-to-face time with patient.  This included previsit chart review, lab review, study review, order entry, electronic health record documentation, patient education regarding prior stroke, secondary stroke prevention measures and aggressive stroke risk factor, residual deficits, completion of Medicare wellness sethi, essential tremors and ongoing use of medication, depression with adjustment disorder and answered all other questions to patient satisfaction  Frann Rider, AGNP-BC  [facility] [address] [place], [place] [address]  Phone [phone] Fax [phone] Note: This document was prepared with digital dictation and possible smart phrase technology. Any transcriptional errors that result from this process are unintentional.

## 2021-04-16 NOTE — Patient Instructions (Signed)
Your Plan:  Continue current treatment regimen - no changes today    Follow up in 6 months or call earlier if needed     Thank you for coming to see [place] at [facility]. I hope we have been able to provide you high quality care today.  You may receive a patient satisfaction survey over the next few weeks. We would appreciate your feedback and comments so that we may continue to improve ourselves and the health of our patients.

## 2021-04-16 NOTE — Progress Notes (Deleted)
[facility] [address] [place]. 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 s/p ILR 12/23/2017 - ILR explanted with PMR implant on 01/01/2018  Left MCA stroke 11/2017 -Residual expressive aphasia and cognitive impairment- recently restarting working with SLP -Remains on clopidogrel with mild bruising but no bleeding -Remains on Crestor 20 mg daily per PCP -recent lipid panel showed LDL 54 -Continues to follow with endocrinology for DM management with recent A1c 7.2 -Blood pressure today *** - does not routinely monitor at home as typically stable -Tobacco use *** -PMR has not shown evidence of atrial fibrillation thus far  Post stroke seizure -No recurrent episodes -Continues on Keppra 500 mg twice daily tolerating without side effects  Mild cognitive impairment -Post stroke - stable per patient report -MMSE today *** (prior 29/30)  Depression/anxiety Adjustment disorder -Overall stable with ongoing use of sertraline 200 mg nightly (per PCP).  Has not recently used Ativan   Essential tremors -stable -Continues on primidone 250 mg daily tolerating well without side effects -Does report worsening with increased anxiety and social situations       ROS:   14 system review of systems is positive for those listed in HPI and all other systems negative   PMH:  Past Medical History:  Diagnosis Date   Asthma    Cataract    Phreesia 10/03/2020   Chronic lower back pain    Depression    Depression    Phreesia 10/03/2020   Diabetes mellitus without complication ([place])    Phreesia 10/03/2020   Family history of adverse reaction to anesthesia    Mother  has trouble coming out of anesthesia; patient reports she has no problems   Fibromyalgia    High cholesterol    Hypertension    Phreesia 10/03/2020   Presence of permanent cardiac pacemaker    Seizures ([place]) 11/2017   "day after she came home from hospital after having stroke" (01/01/2018)   Stroke ([place]) 12/06/2017   "speech issues since" (01/01/2018)   Type II diabetes mellitus ([place])     Social History:  Social History   Socioeconomic History   Marital status: Married    Spouse name: Brittany [name]   Number of children: 2   Years of education: 14   Highest education level: Not on file  Occupational History   Not on file  Tobacco Use   Smoking status: Every Day    Packs/day: 1.00    Years: 38.00    Pack years: 38.00    Types: Cigarettes   Smokeless tobacco: Never  Vaping Use   Vaping Use: Never used  Substance and Sexual Activity   Alcohol use: Yes    Comment: 01/01/2018 "nothing since stroke 11/2017"   Drug use: No   Sexual activity: Not Currently  Other Topics Concern   Not on file  Social History Narrative   Patient is married (Brittany [name]) and lives at home with her family   Patient has two children.   Patient works at [employer].   Patient has a college education.   Patient is right-handed.   Patient drinks 3 cups of coffee M-F.      Social  Determinants of Health   Financial Resource Strain: Not on file  Food Insecurity: Not on file  Transportation Needs: Not on file  Physical Activity: Not on file  Stress: Not on file  Social Connections: Not on file  Intimate Partner Violence: Not on file    Medications:   Current Outpatient Medications on File Prior to Visit  Medication Sig Dispense Refill   Accu-Chek Softclix Lancets lancets 1 each by Other route 4 (four) times daily. E11.9 100 each 12   acetaminophen (TYLENOL) 500 MG tablet Take 1,000 mg by mouth every 6 (six) hours as needed for fever (pain).     B Complex-Biotin-FA (MULTI-B COMPLEX) CAPS Take one a  day ,B complex  multivitamin over the counter or use prenatal vitamin. 90 capsule 0   BD PEN NEEDLE NANO 2ND GEN 32G X 4 MM MISC USE FOUR TIMES DAILY 400 each 3   Blood Glucose Monitoring Suppl (ACCU-CHEK GUIDE ME) w/Device KIT 1 each by Does not apply route 4 (four) times daily. E11.9 1 kit 0   clopidogrel (PLAVIX) 75 MG tablet Take 1 tablet (75 mg total) by mouth daily. 90 tablet 3   glucose blood (ACCU-CHEK GUIDE) test strip 1 each by Other route 4 (four) times daily. E11.9 100 each 12   insulin aspart (NOVOLOG FLEXPEN) 100 UNIT/ML FlexPen INJECT 8 UNITS UNDER THE SKIN EVERY MORNING, 11 UNITS BEFORE LUNCH, AND 10 UNITS EVERY EVENING BEFORE A MEAL 15 mL 11   irbesartan (AVAPRO) 150 MG tablet TAKE 1 TABLET(150 MG) BY MOUTH DAILY 90 tablet 3   LEVEMIR FLEXTOUCH 100 UNIT/ML FlexPen INJECT 18 UNITS UNDER THE SKIN AT BEDTIME 45 mL 1   levETIRAcetam (KEPPRA) 500 MG tablet Take 1 tablet (500 mg total) by mouth 2 (two) times daily. 180 tablet 3   LORazepam (ATIVAN) 0.5 MG tablet Take 0.5-1 tablets (0.25-0.5 mg total) by mouth 2 (two) times daily as needed for anxiety (anxiety and tremors (social situations)). 45 tablet 0   primidone (MYSOLINE) 250 MG tablet TAKE 1 TABLET(250 MG) BY MOUTH DAILY 30 tablet 6   rosuvastatin (CRESTOR) 20 MG tablet Take 1 tablet (20 mg total) by mouth daily. 90 tablet 3   sertraline (ZOLOFT) 100 MG tablet TAKE 2 TABLETS(200 MG) BY MOUTH AT BEDTIME 180 tablet 1   varenicline (CHANTIX CONTINUING MONTH PAK) 1 MG tablet Take 1 tablet (1 mg total) by mouth 2 (two) times daily. 60 tablet 2   varenicline (CHANTIX STARTING MONTH PAK) 0.5 MG X 11 & 1 MG X 42 tablet Take one 0.5 mg tablet by mouth once daily for 3 days, then increase to one 0.5 mg tablet twice daily for 4 days, then increase to one 1 mg tablet twice daily. 53 tablet 0   No current facility-administered medications on file prior to visit.    Allergies:  No Known Allergies  There were no vitals filed for this  visit.  There is no height or weight on file to calculate BMI.   General: well developed, well nourished,  pleasant middle-age Caucasian female, seated, in no evident distress Head: head normocephalic and atraumatic.   Neck: supple with no carotid or supraclavicular bruits Cardiovascular: regular rate and rhythm, no murmurs Musculoskeletal: no deformity Skin:  no rash/petichiae Vascular:  Normal pulses all extremities   Neurologic Exam Mental Status: Awake and fully alert.  Mild to moderate expressive aphasia with word hesitancy.  Oriented to place and time. Recent memory impaired and remote memory intact. Attention span, concentration  and fund of knowledge appropriate today. Mood and affect appropriate throughout visit  MMSE - Mini Mental State Exam 10/16/2020 03/27/2020 09/27/2019  Orientation to time 5 5 5   Orientation to Place 5 5 4   Registration 3 3 3   Attention/ Calculation 5 4 0  Recall 2 2 2   Language- name 2 objects 2 2 2   Language- repeat 1 0 0  Language- follow 3 step command 3 3 3   Language- read & follow direction 1 0 1  Write a sentence 1 1 1   Copy design 1 1 1   Copy design-comments Named 13 animals. 8 animals named 6 animals.  Total score 29 26 22    Cranial Nerves: Pupils equal, briskly reactive to light. Extraocular movements full without nystagmus. Visual fields full to confrontation. Hearing intact. Facial sensation intact. Face, tongue, palate moves normally and symmetrically.  Motor: Normal bulk and tone. Normal strength in all tested extremity muscles.  Sensory.: intact to touch , pinprick , position and vibratory sensation.  Coordination: Mild tremor head and upper arms R>L with outstretched arms and amplified RUE finger-to-nose testing.  No evidence of lower extremity tremor or resting tremor.  No evidence of cogwheel rigidity. Gait and Station: Arises from chair without difficulty. Stance is normal. Gait demonstrates normal stride length and balance without use  of assistive device Reflexes: 1+ and symmetric. Toes downgoing.      ASSESSMENT/PLAN: 60 year old lady with aphasia secondary to left MCA infarct in April 2019 of cryptogenic etiology treated with IV TPA and mechanical thrombectomy with good recanalization. She also had symptomatic seizure after discharge and residual stroke deficit of expressive aphasia and cognitive impairment currently receiving long-term disability.  Restarted on primidone due to essential tremors which has been stable.  Great difficulty with poststroke depression/anxiety and adjustment disorder which has since stabilized    Left MCA stroke -Residual expressive aphasia and cognitive impairment -currently restarted working with neuro rehab SLP -Continue Plavix and Crestor for secondary stroke prevention -Close PCP follow-up for aggressive stroke risk factor management including HTN, HLD and DM -HLD: LDL goal<70.  Recent LDL 75 (09/2020).  On Crestor 20 mg daily per PCP -HTN: BP goal <130/90. Stable today. Continue current regimen and routine follow-up with PCP -DM: A1c goal<7.0.  Recent A1c 7.2 (09/2020).  Recent adjustments made by PCP.  Continue to follow PCP for monitoring management  Post stroke seizure -Continue Keppra 500 mg twice daily for seizure prophylaxis -refills provided -No recurrent seizure episodes.  Advised to call with any seizure symptoms or events  Essential tremors -Continue primidone 250 mg daily -refill provided -Worse with increased anxiety and social situations - recommend trialing low-dose Ativan (as already prescribed and tolerates without side effects) prior to social events or increased stressful situation for possible benefit.  May also consider use propranolol if indicated in future as this may benefit for tremors and anxiety  Depression/anxiety disorder Adjustment disorder -Continue Zoloft 200 mg daily (managed by PCP) and Ativan as needed (refill provided) -Continue working with  psychology for ongoing benefit    Follow-up in 6 months months or call earlier if needed   CC:  [place] provider: Dr. Suzi Roots, Ranell Patrick, MD     I spent 35 minutes of face-to-face and non-face-to-face time with patient.  This included previsit chart review, lab review, study review, order entry, electronic health record documentation, patient education regarding prior stroke, residual deficits, importance of managing stroke risk factors, essential tremors with worsening with anxiety, depression with adjustment disorder and answered  all other questions to patient satisfaction  Frann Rider, [facility]  [facility] [address] [place], [place] [address]  Phone [phone] Fax [phone] Note: This document was prepared with digital dictation and possible smart phrase technology. Any transcriptional errors that result from this process are unintentional.

## 2021-04-22 ENCOUNTER — Other Ambulatory Visit: Payer: Self-pay | Admitting: Endocrinology

## 2021-04-22 DIAGNOSIS — Z794 Long term (current) use of insulin: Secondary | ICD-10-CM

## 2021-04-22 DIAGNOSIS — E1149 Type 2 diabetes mellitus with other diabetic neurological complication: Secondary | ICD-10-CM

## 2021-04-23 NOTE — Progress Notes (Signed)
I agree with the above plan 

## 2021-04-30 NOTE — Progress Notes (Signed)
Remote pacemaker transmission.   

## 2021-05-02 ENCOUNTER — Telehealth: Payer: Self-pay | Admitting: Nutrition

## 2021-05-02 ENCOUNTER — Ambulatory Visit: Payer: Medicare PPO | Admitting: Endocrinology

## 2021-05-02 ENCOUNTER — Other Ambulatory Visit: Payer: Self-pay

## 2021-05-02 VITALS — BP 140/70 | HR 102 | Ht 63.0 in | Wt 179.8 lb

## 2021-05-02 DIAGNOSIS — E1149 Type 2 diabetes mellitus with other diabetic neurological complication: Secondary | ICD-10-CM | POA: Diagnosis not present

## 2021-05-02 DIAGNOSIS — Z794 Long term (current) use of insulin: Secondary | ICD-10-CM | POA: Diagnosis not present

## 2021-05-02 LAB — POCT GLYCOSYLATED HEMOGLOBIN (HGB A1C): Hemoglobin A1C: 8.4 % — AB (ref 4.0–5.6)

## 2021-05-02 NOTE — Telephone Encounter (Signed)
Freestyle Libre professional sensor was started on Ms. Cogswell per Dr. George Hugh verbal order.  This was inserted into her right upper abdomen and covered with a transparent dressing.  Lot: [id number], exp 06/08/21.  She was reminded that she can get this wet and to return in 2 weeks to see Dr. Everardo All for removal and download. Sensor was checked in 2 minutes and it showed that it was working.

## 2021-05-02 NOTE — Patient Instructions (Addendum)
check your blood sugar 4 times a day: before the 3 meals, and at bedtime.  also check if you have symptoms of your blood sugar being too high or too low.  please keep a record of the readings and bring it to your next appointment here (or you can bring the meter itself).  You can write it on any piece of paper.  please call us sooner if your blood sugar goes below 70, or if you have a lot of readings over 200.   Please continue the same 2 insulins.   We are placing a continuous glucose monitor today.   Please come back for a follow-up appointment in 2 weeks.   Please reconsider the weekly diabetes shot, as this may stabilize your blood sugar.

## 2021-05-02 NOTE — Progress Notes (Signed)
Subjective:    Patient ID: Brittany [name], female    DOB: [birth year], 60 y.o.   MRN: [id number]  HPI Pt returns for f/u of diabetes mellitus: DM type: Insulin-requiring type 2 Dx'ed: 8242 Complications: CVA Therapy: insulin since 2019 GDM: 1987 DKA: never Severe hypoglycemia: never Pancreatitis: never Pancreatic imaging: normal on 2019 CT.   [place]: husband gives pt her insulin (due to CVA--he says he knows how to use syringe and vial).   Other: she gets multiple daily injections; She declines to add another med.   Interval history: she brings her meter with her cbg's which I have reviewed today.  cbg varies from 123-314.  There is no trend throughout the day.   husb rarely misses giving her the insulin.  pt states she feels well in general.   Past Medical History:  Diagnosis Date   Asthma    Cataract    Phreesia 10/03/2020   Chronic lower back pain    Depression    Depression    Phreesia 10/03/2020   Diabetes mellitus without complication ([place])    Phreesia 10/03/2020   Family history of adverse reaction to anesthesia    Mother has trouble coming out of anesthesia; patient reports she has no problems   Fibromyalgia    High cholesterol    Hypertension    Phreesia 10/03/2020   Presence of permanent cardiac pacemaker    Seizures ([place]) 11/2017   "day after she came home from hospital after having stroke" (01/01/2018)   Stroke ([place]) 12/06/2017   "speech issues since" (01/01/2018)   Type II diabetes mellitus [facility])     Past Surgical History:  Procedure Laterality Date   CHOLECYSTECTOMY     EYE SURGERY N/A    Phreesia 10/03/2020   GAS/FLUID EXCHANGE Right 12/12/2019   Procedure: Gas/Fluid Exchange;  Surgeon: Jalene Mullet, MD;  Location: [place];  Service: Ophthalmology;  Laterality: Right;   INSERT / REPLACE / REMOVE PACEMAKER  01/01/2018   IR CT HEAD LTD  12/06/2017   IR PERCUTANEOUS ART THROMBECTOMY/INFUSION INTRACRANIAL INC DIAG ANGIO  12/06/2017   LOOP RECORDER INSERTION  N/A 12/12/2017   Procedure: LOOP RECORDER INSERTION;  Surgeon: Constance Haw, MD;  Location: [place] CV [facility];  Service: Cardiovascular;  Laterality: N/A;   LOOP RECORDER REMOVAL  01/01/2018   LOOP RECORDER REMOVAL N/A 01/01/2018   Procedure: LOOP RECORDER REMOVAL;  Surgeon: Constance Haw, MD;  Location: [place] [facility];  Service: Cardiovascular;  Laterality: N/A;   NECK SURGERY     PACEMAKER IMPLANT N/A 01/01/2018   Procedure: PACEMAKER IMPLANT;  Surgeon: Constance Haw, MD;  Location: [place] [facility];  Service: Cardiovascular;  Laterality: N/A;   PHOTOCOAGULATION Right 12/12/2019   Procedure: Photocoagulation;  Surgeon: Jalene Mullet, MD;  Location: [place];  Service: Ophthalmology;  Laterality: Right;   RADIOLOGY WITH ANESTHESIA N/A 12/06/2017   Procedure: CODE STROKE;  Surgeon: Luanne Bras, MD;  Location: [place];  Service: Radiology;  Laterality: N/A;   SCLERAL BUCKLE WITH POSSIBLE 25 GAUGE PARS PLANA VITRECTOMY Right 12/12/2019   Procedure: SCLERAL BUCKLE WITH  PARS PLANA VITRECTOMY RETINAL DETACHMENT REPAIR;  Surgeon: Jalene Mullet, MD;  Location: [place];  Service: Ophthalmology;  Laterality: Right;   TEE WITHOUT CARDIOVERSION N/A 12/10/2017   Procedure: TRANSESOPHAGEAL ECHOCARDIOGRAM (TEE);  Surgeon: Jerline Pain, MD;  Location: [facility] ENDOSCOPY;  Service: Cardiovascular;  Laterality: N/A;   THROMBECTOMY FEMORAL ARTERY Right 12/07/2017   Procedure: THROMBECTOMY RIGHT FEMORAL ARTERY;  Surgeon:  Waynetta Sandy, MD;  [facility] OR;  Service: Vascular;  Laterality: Right;   TONSILLECTOMY      Social History   Socioeconomic History   Marital status: Married    Spouse name: [name]   Number of children: 2   Years of education: 14   Highest education level: Not on file  Occupational History   Not on file  Tobacco Use   Smoking status: Every Day    Packs/day: 1.00    Years: 38.00    Pack years: 38.00    Types: Cigarettes   Smokeless tobacco: Never   Vaping Use   Vaping Use: Never used  Substance and Sexual Activity   Alcohol use: Yes    Comment: 01/01/2018 "nothing since stroke 11/2017"   Drug use: No   Sexual activity: Not Currently  Other Topics Concern   Not on file  Social History Narrative   Patient is married ([name]) and lives at home with her family   Patient has two children.   Patient works at [employer].   Patient has a college education.   Patient is right-handed.   Patient drinks 3 cups of coffee M-F.      Social Determinants of Health   Financial Resource Strain: Not on file  Food Insecurity: Not on file  Transportation Needs: Not on file  Physical Activity: Not on file  Stress: Not on file  Social Connections: Not on file  Intimate Partner Violence: Not on file    Current Outpatient Medications on File Prior to Visit  Medication Sig Dispense Refill   ACCU-CHEK GUIDE test strip USE FOUR TIMES DAILY 100 strip 5   Accu-Chek Softclix Lancets lancets 1 each by Other route 4 (four) times daily. E11.9 100 each 12   acetaminophen (TYLENOL) 500 MG tablet Take 1,000 mg by mouth every 6 (six) hours as needed for fever (pain).     B Complex-Biotin-FA (MULTI-B COMPLEX) CAPS Take one a day ,B complex  multivitamin over the counter or use prenatal vitamin. 90 capsule 0   BD PEN NEEDLE NANO 2ND GEN 32G X 4 MM MISC USE FOUR TIMES DAILY 400 each 3   Blood Glucose Monitoring Suppl (ACCU-CHEK GUIDE ME) w/Device KIT 1 each by Does not apply route 4 (four) times daily. E11.9 1 kit 0   clopidogrel (PLAVIX) 75 MG tablet Take 1 tablet (75 mg total) by mouth daily. 90 tablet 3   insulin aspart (NOVOLOG FLEXPEN) 100 UNIT/ML FlexPen INJECT 8 UNITS UNDER THE SKIN EVERY MORNING, 11 UNITS BEFORE LUNCH, AND 10 UNITS EVERY EVENING BEFORE A MEAL 15 mL 11   irbesartan (AVAPRO) 150 MG tablet TAKE 1 TABLET(150 MG) BY MOUTH DAILY 90 tablet 3   LEVEMIR FLEXTOUCH 100 UNIT/ML FlexPen INJECT 18 UNITS UNDER THE SKIN AT BEDTIME 45 mL 1    levETIRAcetam (KEPPRA) 500 MG tablet Take 1 tablet (500 mg total) by mouth 2 (two) times daily. 180 tablet 3   LORazepam (ATIVAN) 0.5 MG tablet Take 0.5-1 tablets (0.25-0.5 mg total) by mouth 2 (two) times daily as needed for anxiety (anxiety and tremors (social situations)). 45 tablet 0   primidone (MYSOLINE) 250 MG tablet Take 1 tablet (250 mg total) by mouth daily. TAKE 1 TABLET(250 MG) BY MOUTH DAILY 90 tablet 3   rosuvastatin (CRESTOR) 20 MG tablet Take 1 tablet (20 mg total) by mouth daily. 90 tablet 3   sertraline (ZOLOFT) 100 MG tablet TAKE 2 TABLETS(200 MG) BY MOUTH AT BEDTIME 180 tablet  1   varenicline (CHANTIX CONTINUING MONTH PAK) 1 MG tablet Take 1 tablet (1 mg total) by mouth 2 (two) times daily. 60 tablet 2   varenicline (CHANTIX STARTING MONTH PAK) 0.5 MG X 11 & 1 MG X 42 tablet Take one 0.5 mg tablet by mouth once daily for 3 days, then increase to one 0.5 mg tablet twice daily for 4 days, then increase to one 1 mg tablet twice daily. 53 tablet 0   No current facility-administered medications on file prior to visit.    No Known Allergies  Family History  Problem Relation Age of Onset   Cancer Mother    Diabetes Mother    Heart disease Mother    Parkinsonism Mother    Diabetes Father    Heart disease Father    Dementia Father    Cancer Maternal Grandmother 59       lung   COPD Maternal Aunt     BP 140/70 (BP Location: Right Arm, Patient Position: Sitting, Cuff Size: Normal)   Pulse (!) 102   Ht 5' 3"  (1.6 m)   Wt 179 lb 12.8 oz (81.6 kg)   SpO2 97%   BMI 31.85 kg/m    Review of Systems She denies hypoglycemia.      Objective:   Physical Exam Pulses: dorsalis pedis intact bilat.   MSK: no deformity of the feet CV: no leg edema.   Skin:  no ulcer on the feet.  normal color and temp on the feet. Neuro: sensation is intact to touch on the feet.     Lab Results  Component Value Date   HGBA1C 8.4 (A) 05/02/2021       Assessment & Plan:   Insulin-requiring type 2 DM: uncontrolled.   Patient Instructions  check your blood sugar 4 times a day: before the 3 meals, and at bedtime.  also check if you have symptoms of your blood sugar being too high or too low.  please keep a record of the readings and bring it to your next appointment here (or you can bring the meter itself).  You can write it on any piece of paper.  please call us sooner if your blood sugar goes below 70, or if you have a lot of readings over 200.   Please continue the same 2 insulins.   We are placing a continuous glucose monitor today.   Please come back for a follow-up appointment in 2 weeks.   Please reconsider the weekly diabetes shot, as this may stabilize your blood sugar.

## 2021-05-06 ENCOUNTER — Encounter: Payer: Self-pay | Admitting: Endocrinology

## 2021-05-15 ENCOUNTER — Ambulatory Visit (INDEPENDENT_AMBULATORY_CARE_PROVIDER_SITE_OTHER): Payer: Medicare PPO | Admitting: Endocrinology

## 2021-05-15 ENCOUNTER — Other Ambulatory Visit: Payer: Self-pay

## 2021-05-15 DIAGNOSIS — E1149 Type 2 diabetes mellitus with other diabetic neurological complication: Secondary | ICD-10-CM

## 2021-05-15 DIAGNOSIS — Z794 Long term (current) use of insulin: Secondary | ICD-10-CM | POA: Diagnosis not present

## 2021-05-15 MED ORDER — LEVEMIR FLEXTOUCH 100 UNIT/ML ~~LOC~~ SOPN: 12.0000 [IU] | PEN_INJECTOR | Freq: Every day | SUBCUTANEOUS | 1 refills | Status: DC

## 2021-05-15 MED ORDER — NOVOLOG FLEXPEN 100 UNIT/ML ~~LOC~~ SOPN: PEN_INJECTOR | SUBCUTANEOUS | 11 refills | Status: DC

## 2021-05-15 MED ORDER — TRULICITY 0.75 MG/0.5ML ~~LOC~~ SOAJ: 0.7500 mg | SUBCUTANEOUS | 3 refills | Status: DC

## 2021-05-15 NOTE — Patient Instructions (Addendum)
check your blood sugar 4 times a day: before the 3 meals, and at bedtime.  also check if you have symptoms of your blood sugar being too high or too low.  please keep a record of the readings and bring it to your next appointment here (or you can bring the meter itself).  You can write it on any piece of paper.  please call us sooner if your blood sugar goes below 70, or if you have a lot of readings over 200.   Please reduce the 2 insulins to the numbers listed below.    I have sent a prescription to your pharmacy, to add the weekly diabetes shot.   Please come back for a follow-up appointment in 2 months.

## 2021-05-15 NOTE — Progress Notes (Signed)
Subjective:    Patient ID: Brittany [name], female    DOB: December 28, 1960, 60 y.o.   MRN: [id number]  HPI Pt returns for f/u of diabetes mellitus: DM type: Insulin-requiring type 2 Dx'ed: 6270 Complications: CVA Therapy: insulin since 2019 GDM: 1987 DKA: never Severe hypoglycemia: never Pancreatitis: never Pancreatic imaging: normal on 2019 CT.   [place]: husband gives pt her insulin (due to CVA--he says he knows how to use syringe and vial).   Other: she gets multiple daily injections; She declines to add another med.   Interval history: I reviewed continuous glucose monitor data.  There are approx 90 hrs of data.  Glucose varies from 50-300.  It is in general highest 1PM-6PM, but not so on every day.   husb rarely misses giving her the insulin.  pt states she feels well in general.  Past Medical History:  Diagnosis Date   Asthma    Cataract    Phreesia 10/03/2020   Chronic lower back pain    Depression    Depression    Phreesia 10/03/2020   Diabetes mellitus without complication ([place])    Phreesia 10/03/2020   Family history of adverse reaction to anesthesia    Mother has trouble coming out of anesthesia; patient reports she has no problems   Fibromyalgia    High cholesterol    Hypertension    Phreesia 10/03/2020   Presence of permanent cardiac pacemaker    Seizures ([place]) 11/2017   "day after she came home from hospital after having stroke" (01/01/2018)   Stroke ([place]) 12/06/2017   "speech issues since" (01/01/2018)   Type II diabetes mellitus [facility])     Past Surgical History:  Procedure Laterality Date   CHOLECYSTECTOMY     EYE SURGERY N/A    Phreesia 10/03/2020   GAS/FLUID EXCHANGE Right 12/12/2019   Procedure: Gas/Fluid Exchange;  Surgeon: Jalene Mullet, MD;  Location: [place];  Service: Ophthalmology;  Laterality: Right;   INSERT / REPLACE / REMOVE PACEMAKER  01/01/2018   IR CT HEAD LTD  12/06/2017   IR PERCUTANEOUS ART THROMBECTOMY/INFUSION INTRACRANIAL INC DIAG ANGIO   12/06/2017   LOOP RECORDER INSERTION N/A 12/12/2017   Procedure: LOOP RECORDER INSERTION;  Surgeon: Constance Haw, MD;  Location: [place] [facility];  Service: Cardiovascular;  Laterality: N/A;   LOOP RECORDER REMOVAL  01/01/2018   LOOP RECORDER REMOVAL N/A 01/01/2018   Procedure: LOOP RECORDER REMOVAL;  Surgeon: Constance Haw, MD;  Location: [place] [facility];  Service: Cardiovascular;  Laterality: N/A;   NECK SURGERY     PACEMAKER IMPLANT N/A 01/01/2018   Procedure: PACEMAKER IMPLANT;  Surgeon: Constance Haw, MD;  Location: [place] [facility];  Service: Cardiovascular;  Laterality: N/A;   PHOTOCOAGULATION Right 12/12/2019   Procedure: Photocoagulation;  Surgeon: Jalene Mullet, MD;  Location: [place];  Service: Ophthalmology;  Laterality: Right;   RADIOLOGY WITH ANESTHESIA N/A 12/06/2017   Procedure: CODE STROKE;  Surgeon: Luanne Bras, MD;  Location: [place];  Service: Radiology;  Laterality: N/A;   SCLERAL BUCKLE WITH POSSIBLE 25 GAUGE PARS PLANA VITRECTOMY Right 12/12/2019   Procedure: SCLERAL BUCKLE WITH  PARS PLANA VITRECTOMY RETINAL DETACHMENT REPAIR;  Surgeon: Jalene Mullet, MD;  Location: [place];  Service: Ophthalmology;  Laterality: Right;   TEE WITHOUT CARDIOVERSION N/A 12/10/2017   Procedure: TRANSESOPHAGEAL ECHOCARDIOGRAM (TEE);  Surgeon: Jerline Pain, MD;  Location: [facility] ENDOSCOPY;  Service: Cardiovascular;  Laterality: N/A;   THROMBECTOMY FEMORAL ARTERY Right 12/07/2017   Procedure:  THROMBECTOMY RIGHT FEMORAL ARTERY;  Surgeon: Waynetta Sandy, MD;  Location: [facility] OR;  Service: Vascular;  Laterality: Right;   TONSILLECTOMY      Social History   Socioeconomic History   Marital status: Married    Spouse name: [name]   Number of children: 2   Years of education: 14   Highest education level: Not on file  Occupational History   Not on file  Tobacco Use   Smoking status: Every Day    Packs/day: 1.00    Years: 38.00    Pack years: 38.00    Types:  Cigarettes   Smokeless tobacco: Never  Vaping Use   Vaping Use: Never used  Substance and Sexual Activity   Alcohol use: Yes    Comment: 01/01/2018 "nothing since stroke 11/2017"   Drug use: No   Sexual activity: Not Currently  Other Topics Concern   Not on file  Social History Narrative   Patient is married ([name]) and lives at home with her family   Patient has two children.   Patient works at [employer].   Patient has a college education.   Patient is right-handed.   Patient drinks 3 cups of coffee M-F.      Social Determinants of Health   Financial Resource Strain: Not on file  Food Insecurity: Not on file  Transportation Needs: Not on file  Physical Activity: Not on file  Stress: Not on file  Social Connections: Not on file  Intimate Partner Violence: Not on file    Current Outpatient Medications on File Prior to Visit  Medication Sig Dispense Refill   ACCU-CHEK GUIDE test strip USE FOUR TIMES DAILY 100 strip 5   Accu-Chek Softclix Lancets lancets 1 each by Other route 4 (four) times daily. E11.9 100 each 12   acetaminophen (TYLENOL) 500 MG tablet Take 1,000 mg by mouth every 6 (six) hours as needed for fever (pain).     B Complex-Biotin-FA (MULTI-B COMPLEX) CAPS Take one a day ,B complex  multivitamin over the counter or use prenatal vitamin. 90 capsule 0   BD PEN NEEDLE NANO 2ND GEN 32G X 4 MM MISC USE FOUR TIMES DAILY 400 each 3   Blood Glucose Monitoring Suppl (ACCU-CHEK GUIDE ME) w/Device KIT 1 each by Does not apply route 4 (four) times daily. E11.9 1 kit 0   clopidogrel (PLAVIX) 75 MG tablet Take 1 tablet (75 mg total) by mouth daily. 90 tablet 3   irbesartan (AVAPRO) 150 MG tablet TAKE 1 TABLET(150 MG) BY MOUTH DAILY 90 tablet 3   levETIRAcetam (KEPPRA) 500 MG tablet Take 1 tablet (500 mg total) by mouth 2 (two) times daily. 180 tablet 3   LORazepam (ATIVAN) 0.5 MG tablet Take 0.5-1 tablets (0.25-0.5 mg total) by mouth 2 (two) times daily as needed for  anxiety (anxiety and tremors (social situations)). 45 tablet 0   primidone (MYSOLINE) 250 MG tablet Take 1 tablet (250 mg total) by mouth daily. TAKE 1 TABLET(250 MG) BY MOUTH DAILY 90 tablet 3   rosuvastatin (CRESTOR) 20 MG tablet Take 1 tablet (20 mg total) by mouth daily. 90 tablet 3   sertraline (ZOLOFT) 100 MG tablet TAKE 2 TABLETS(200 MG) BY MOUTH AT BEDTIME 180 tablet 1   varenicline (CHANTIX CONTINUING MONTH PAK) 1 MG tablet Take 1 tablet (1 mg total) by mouth 2 (two) times daily. 60 tablet 2   varenicline (CHANTIX STARTING MONTH PAK) 0.5 MG X 11 & 1 MG X 42 tablet Take  one 0.5 mg tablet by mouth once daily for 3 days, then increase to one 0.5 mg tablet twice daily for 4 days, then increase to one 1 mg tablet twice daily. 53 tablet 0   No current facility-administered medications on file prior to visit.    No Known Allergies  Family History  Problem Relation Age of Onset   Cancer Mother    Diabetes Mother    Heart disease Mother    Parkinsonism Mother    Diabetes Father    Heart disease Father    Dementia Father    Cancer Maternal Grandmother 56       lung   COPD Maternal Aunt     BP 120/60   Pulse 89   Ht _0  (1.6 m)   Wt 180 lb 3.2 oz (81.7 kg)   SpO2 94%   BMI 31.92 kg/m    Review of Systems She denies hypoglycemia.      Objective:   Physical Exam Pulses: dorsalis pedis intact bilat.   MSK: no deformity of the feet CV: no leg edema Skin:  no ulcer on the feet.  normal color and temp on the feet. Neuro: sensation is intact to touch on the feet.  Lab Results  Component Value Date   HGBA1C 8.4 (A) 05/02/2021       Assessment & Plan:  Insulin-requiring type 2 DM: uncontrolled.   Hypoglycemia, due to insulin: We discussed.  She agrees to start GLP, and reduce insulin.   Patient Instructions  check your blood sugar 4 times a day: before the 3 meals, and at bedtime.  also check if you have symptoms of your blood sugar being too high or too low.  please  keep a record of the readings and bring it to your next appointment here (or you can bring the meter itself).  You can write it on any piece of paper.  please call us sooner if your blood sugar goes below 70, or if you have a lot of readings over 200.   Please reduce the 2 insulins to the numbers listed below.    I have sent a prescription to your pharmacy, to add the weekly diabetes shot.   Please come back for a follow-up appointment in 2 months.

## 2021-05-16 DIAGNOSIS — F324 Major depressive disorder, single episode, in partial remission: Secondary | ICD-10-CM | POA: Diagnosis not present

## 2021-05-16 DIAGNOSIS — E785 Hyperlipidemia, unspecified: Secondary | ICD-10-CM | POA: Diagnosis not present

## 2021-05-16 DIAGNOSIS — F419 Anxiety disorder, unspecified: Secondary | ICD-10-CM | POA: Diagnosis not present

## 2021-05-16 DIAGNOSIS — E1165 Type 2 diabetes mellitus with hyperglycemia: Secondary | ICD-10-CM | POA: Diagnosis not present

## 2021-05-16 DIAGNOSIS — Z794 Long term (current) use of insulin: Secondary | ICD-10-CM | POA: Diagnosis not present

## 2021-05-16 DIAGNOSIS — E669 Obesity, unspecified: Secondary | ICD-10-CM | POA: Diagnosis not present

## 2021-05-16 DIAGNOSIS — G252 Other specified forms of tremor: Secondary | ICD-10-CM | POA: Diagnosis not present

## 2021-05-16 DIAGNOSIS — F17210 Nicotine dependence, cigarettes, uncomplicated: Secondary | ICD-10-CM | POA: Diagnosis not present

## 2021-05-16 DIAGNOSIS — G40909 Epilepsy, unspecified, not intractable, without status epilepticus: Secondary | ICD-10-CM | POA: Diagnosis not present

## 2021-05-22 ENCOUNTER — Other Ambulatory Visit: Payer: Self-pay

## 2021-05-22 ENCOUNTER — Other Ambulatory Visit: Payer: Self-pay | Admitting: Family Medicine

## 2021-05-22 DIAGNOSIS — E785 Hyperlipidemia, unspecified: Secondary | ICD-10-CM

## 2021-05-22 MED ORDER — ROSUVASTATIN CALCIUM 20 MG PO TABS: 20.0000 mg | ORAL_TABLET | Freq: Every day | ORAL | 3 refills | Status: DC

## 2021-05-24 ENCOUNTER — Telehealth: Payer: Self-pay | Admitting: Dietician

## 2021-05-24 ENCOUNTER — Telehealth: Payer: Self-pay

## 2021-05-24 NOTE — Telephone Encounter (Signed)
Spoke with patient about Rx. An Rx of rosuvastatin 20mg  has been sent to patient pharmacy on 05/22/21. Patient notified.

## 2021-05-24 NOTE — Telephone Encounter (Signed)
  Encourage patient to contact the pharmacy for refills or they can request refills through [facility]  LAST APPOINTMENT DATE:  04/05/21  NEXT APPOINTMENT DATE:  MEDICATION: Atorvastatin 20 MG  Is the patient out of medication? yes  PHARMACY: [id number] - [place], [place] - [address] AT [facility] OF [address]  Let patient know to contact pharmacy at the end of the day to make sure medication is ready.  Please notify patient to allow 48-72 hours to process

## 2021-05-24 NOTE — Telephone Encounter (Signed)
Returned patient call. Patient had questions re:  the Brittany [name] that was placed on August 24. Chart reviewed.   Patient was seen by Dr. Everardo All 05/15/2021. Patient states that the [facility] was downloaded but due to technology problems, it only showed 3 days of data. Patient is asking if the [name] can be removed. Discussed that the [name] will only collect data for 2 weeks and she should remove this. She states that she has started the new medication.  She reports feeling more tired but is eating well.  Patient with no further questions.  Oran Rein, RD, LDN, CDCES

## 2021-07-03 ENCOUNTER — Ambulatory Visit (INDEPENDENT_AMBULATORY_CARE_PROVIDER_SITE_OTHER): Payer: Medicare PPO

## 2021-07-03 DIAGNOSIS — I442 Atrioventricular block, complete: Secondary | ICD-10-CM | POA: Diagnosis not present

## 2021-07-03 LAB — CUP PACEART REMOTE DEVICE CHECK
Battery Remaining Longevity: 141 mo
Battery Voltage: 3.02 V
Brady Statistic AP VP Percent: 0.11 %
Brady Statistic AP VS Percent: 0.12 %
Brady Statistic AS VP Percent: 4.7 %
Brady Statistic AS VS Percent: 95.07 %
Brady Statistic RA Percent Paced: 0.24 %
Brady Statistic RV Percent Paced: 4.82 %
Date Time Interrogation Session: 20221025004407
Implantable Lead Implant Date: 20190425
Implantable Lead Implant Date: 20190425
Implantable Lead Location: 753859
Implantable Lead Location: 753860
Implantable Lead Model: 5076
Implantable Lead Model: 5076
Implantable Pulse Generator Implant Date: 20190425
Lead Channel Impedance Value: 361 Ohm
Lead Channel Impedance Value: 380 Ohm
Lead Channel Impedance Value: 418 Ohm
Lead Channel Impedance Value: 494 Ohm
Lead Channel Pacing Threshold Amplitude: 0.625 V
Lead Channel Pacing Threshold Amplitude: 0.625 V
Lead Channel Pacing Threshold Pulse Width: 0.4 ms
Lead Channel Pacing Threshold Pulse Width: 0.4 ms
Lead Channel Sensing Intrinsic Amplitude: 14.25 mV
Lead Channel Sensing Intrinsic Amplitude: 14.25 mV
Lead Channel Sensing Intrinsic Amplitude: 3 mV
Lead Channel Sensing Intrinsic Amplitude: 3 mV
Lead Channel Setting Pacing Amplitude: 1.5 V
Lead Channel Setting Pacing Amplitude: 2.5 V
Lead Channel Setting Pacing Pulse Width: 0.4 ms
Lead Channel Setting Sensing Sensitivity: 2 mV

## 2021-07-09 ENCOUNTER — Other Ambulatory Visit: Payer: Self-pay | Admitting: Endocrinology

## 2021-07-09 DIAGNOSIS — E1149 Type 2 diabetes mellitus with other diabetic neurological complication: Secondary | ICD-10-CM

## 2021-07-09 DIAGNOSIS — Z794 Long term (current) use of insulin: Secondary | ICD-10-CM

## 2021-07-11 NOTE — Progress Notes (Signed)
Remote pacemaker transmission.   

## 2021-07-16 ENCOUNTER — Other Ambulatory Visit: Payer: Self-pay

## 2021-07-16 ENCOUNTER — Ambulatory Visit: Payer: Medicare PPO | Admitting: Endocrinology

## 2021-07-16 VITALS — BP 130/70 | HR 103 | Ht 63.0 in | Wt 177.8 lb

## 2021-07-16 DIAGNOSIS — E1149 Type 2 diabetes mellitus with other diabetic neurological complication: Secondary | ICD-10-CM

## 2021-07-16 DIAGNOSIS — Z794 Long term (current) use of insulin: Secondary | ICD-10-CM | POA: Diagnosis not present

## 2021-07-16 LAB — POCT GLYCOSYLATED HEMOGLOBIN (HGB A1C): Hemoglobin A1C: 8.6 % — AB (ref 4.0–5.6)

## 2021-07-16 MED ORDER — TRULICITY 1.5 MG/0.5ML ~~LOC~~ SOAJ: 1.5000 mg | SUBCUTANEOUS | 3 refills | Status: DC

## 2021-07-16 MED ORDER — DEXCOM G6 TRANSMITTER MISC: 1.0000 | Freq: Once | 1 refills | Status: AC

## 2021-07-16 MED ORDER — DEXCOM G6 SENSOR MISC: 1.0000 | 3 refills | Status: DC

## 2021-07-16 MED ORDER — DEXCOM G6 RECEIVER DEVI: 1.0000 | Freq: Once | 1 refills | Status: AC

## 2021-07-16 NOTE — Patient Instructions (Addendum)
check your blood sugar 4 times a day: before the 3 meals, and at bedtime.  also check if you have symptoms of your blood sugar being too high or too low.  please keep a record of the readings and bring it to your next appointment here (or you can bring the meter itself).  You can write it on any piece of paper.  please call us sooner if your blood sugar goes below 70, or if you have a lot of readings over 200.   Please continue the same 2 insulins.  I have sent 2 prescriptions to your pharmacy, to increase the Trulicity, and for the continuous glucose monitor.   Applying benzion before putting on the sensor will help it fall off.   Please come back for a follow-up appointment in January.

## 2021-07-16 NOTE — Progress Notes (Signed)
Subjective:    Patient ID: Brittany [name], female    DOB: [birth year], 60 y.o.   MRN: [id number]  HPI Pt returns for f/u of diabetes mellitus: DM type: Insulin-requiring type 2 Dx'ed: 1791 Complications: CVA Therapy: insulin since 5056, and Trulicity GDM: 9794 DKA: never Severe hypoglycemia: never Pancreatitis: never Pancreatic imaging: normal on 2019 CT.   [place]: husband gives pt her insulin (due to CVA--he says he knows how to use syringe and vial).   Other: she gets multiple daily injections.   Interval history: she brings her meter with her cbg's which I have reviewed today.  cbg varies from 150-265.  husb says he rarely misses giving her the insulin.  pt states she feels well in general.    Past Medical History:  Diagnosis Date   Asthma    Cataract    Phreesia 10/03/2020   Chronic lower back pain    Depression    Depression    Phreesia 10/03/2020   Diabetes mellitus without complication ([place])    Phreesia 10/03/2020   Family history of adverse reaction to anesthesia    Mother has trouble coming out of anesthesia; patient reports she has no problems   Fibromyalgia    High cholesterol    Hypertension    Phreesia 10/03/2020   Presence of permanent cardiac pacemaker    Seizures ([place]) 11/2017   "day after she came home from hospital after having stroke" (01/01/2018)   Stroke ([place]) 12/06/2017   "speech issues since" (01/01/2018)   Type II diabetes mellitus [facility])     Past Surgical History:  Procedure Laterality Date   CHOLECYSTECTOMY     EYE SURGERY N/A    Phreesia 10/03/2020   GAS/FLUID EXCHANGE Right 12/12/2019   Procedure: Gas/Fluid Exchange;  Surgeon: Jalene Mullet, MD;  Location: [place];  Service: Ophthalmology;  Laterality: Right;   INSERT / REPLACE / REMOVE PACEMAKER  01/01/2018   IR CT HEAD LTD  12/06/2017   IR PERCUTANEOUS ART THROMBECTOMY/INFUSION INTRACRANIAL INC DIAG ANGIO  12/06/2017   LOOP RECORDER INSERTION N/A 12/12/2017   Procedure: LOOP RECORDER  INSERTION;  Surgeon: Constance Haw, MD;  Location: [place] [facility];  Service: Cardiovascular;  Laterality: N/A;   LOOP RECORDER REMOVAL  01/01/2018   LOOP RECORDER REMOVAL N/A 01/01/2018   Procedure: LOOP RECORDER REMOVAL;  Surgeon: Constance Haw, MD;  Location: [place] [facility];  Service: Cardiovascular;  Laterality: N/A;   NECK SURGERY     PACEMAKER IMPLANT N/A 01/01/2018   Procedure: PACEMAKER IMPLANT;  Surgeon: Constance Haw, MD;  Location: [place] CV [facility];  Service: Cardiovascular;  Laterality: N/A;   PHOTOCOAGULATION Right 12/12/2019   Procedure: Photocoagulation;  Surgeon: Jalene Mullet, MD;  Location: [place];  Service: Ophthalmology;  Laterality: Right;   RADIOLOGY WITH ANESTHESIA N/A 12/06/2017   Procedure: CODE STROKE;  Surgeon: Luanne Bras, MD;  Location: [place];  Service: Radiology;  Laterality: N/A;   SCLERAL BUCKLE WITH POSSIBLE 25 GAUGE PARS PLANA VITRECTOMY Right 12/12/2019   Procedure: SCLERAL BUCKLE WITH  PARS PLANA VITRECTOMY RETINAL DETACHMENT REPAIR;  Surgeon: Jalene Mullet, MD;  Location: [place];  Service: Ophthalmology;  Laterality: Right;   TEE WITHOUT CARDIOVERSION N/A 12/10/2017   Procedure: TRANSESOPHAGEAL ECHOCARDIOGRAM (TEE);  Surgeon: Jerline Pain, MD;  Location: [facility] END[facility]COPY;  Service: Cardiovascular;  Laterality: N/A;   THROMBECTOMY FEMORAL ARTERY Right 12/07/2017   Procedure: THROMBECTOMY RIGHT FEMORAL ARTERY;  Surgeon: Waynetta Sandy, MD;  Location: [place];  Service:  Vascular;  Laterality: Right;   TONSILLECTOMY      Social History   Socioeconomic History   Marital status: Married    Spouse name: [name]   Number of children: 2   Years of education: 14   Highest education level: Not on file  Occupational History   Not on file  Tobacco Use   Smoking status: Every Day    Packs/day: 1.00    Years: 38.00    Pack years: 38.00    Types: Cigarettes   Smokeless tobacco: Never  Vaping Use   Vaping Use: Never used   Substance and Sexual Activity   Alcohol use: Yes    Comment: 01/01/2018 "nothing since stroke 11/2017"   Drug use: No   Sexual activity: Not Currently  Other Topics Concern   Not on file  Social History Narrative   Patient is married ([name]) and lives at home with her family   Patient has two children.   Patient works at [employer].   Patient has a college education.   Patient is right-handed.   Patient drinks 3 cups of coffee M-F.      Social Determinants of Health   Financial Resource Strain: Not on file  Food Insecurity: Not on file  Transportation Needs: Not on file  Physical Activity: Not on file  Stress: Not on file  Social Connections: Not on file  Intimate Partner Violence: Not on file    Current Outpatient Medications on File Prior to Visit  Medication Sig Dispense Refill   ACCU-CHEK GUIDE test strip USE FOUR TIMES DAILY 100 strip 5   acetaminophen (TYLENOL) 500 MG tablet Take 1,000 mg by mouth every 6 (six) hours as needed for fever (pain).     B Complex-Biotin-FA (MULTI-B COMPLEX) CAPS Take one a day ,B complex  multivitamin over the counter or use prenatal vitamin. 90 capsule 0   BD PEN NEEDLE NANO 2ND GEN 32G X 4 MM MISC USE FOUR TIMES DAILY 400 each 3   Blood Glucose Monitoring Suppl (ACCU-CHEK GUIDE ME) w/Device KIT 1 each by Does not apply route 4 (four) times daily. E11.9 1 kit 0   clopidogrel (PLAVIX) 75 MG tablet Take 1 tablet (75 mg total) by mouth daily. 90 tablet 3   insulin aspart (NOVOLOG FLEXPEN) 100 UNIT/ML FlexPen 3 times a day (just before each meal) 4-6-6 units. 15 mL 11   insulin detemir (LEVEMIR FLEXTOUCH) 100 UNIT/ML FlexPen Inject 12 Units into the skin at bedtime. 45 mL 1   irbesartan (AVAPRO) 150 MG tablet TAKE 1 TABLET(150 MG) BY MOUTH DAILY 90 tablet 3   Lancets (ONETOUCH DELICA PLUS [id number]) MISC USE FOUR TIMES DAILY 400 each 3   levETIRAcetam (KEPPRA) 500 MG tablet Take 1 tablet (500 mg total) by mouth 2 (two) times daily. 180  tablet 3   LORazepam (ATIVAN) 0.5 MG tablet Take 0.5-1 tablets (0.25-0.5 mg total) by mouth 2 (two) times daily as needed for anxiety (anxiety and tremors (social situations)). 45 tablet 0   primidone (MYSOLINE) 250 MG tablet Take 1 tablet (250 mg total) by mouth daily. TAKE 1 TABLET(250 MG) BY MOUTH DAILY 90 tablet 3   rosuvastatin (CRESTOR) 20 MG tablet Take 1 tablet (20 mg total) by mouth daily. 90 tablet 3   sertraline (ZOLOFT) 100 MG tablet TAKE 2 TABLETS(200 MG) BY MOUTH AT BEDTIME 180 tablet 1   varenicline (CHANTIX CONTINUING MONTH PAK) 1 MG tablet Take 1 tablet (1 mg total) by mouth 2 (two) times  daily. 60 tablet 2   varenicline (CHANTIX STARTING MONTH PAK) 0.5 MG X 11 & 1 MG X 42 tablet Take one 0.5 mg tablet by mouth once daily for 3 days, then increase to one 0.5 mg tablet twice daily for 4 days, then increase to one 1 mg tablet twice daily. 53 tablet 0   No current facility-administered medications on file prior to visit.    No Known Allergies  Family History  Problem Relation Age of Onset   Cancer Mother    Diabetes Mother    Heart disease Mother    Parkinsonism Mother    Diabetes Father    Heart disease Father    Dementia Father    Cancer Maternal Grandmother 38       lung   COPD Maternal Aunt     BP 130/70 (BP Location: Right Arm, Patient Position: Sitting, Cuff Size: Normal)   Pulse (!) 103   Ht _0  (1.6 m)   Wt 177 lb 12.8 oz (80.6 kg)   SpO2 95%   BMI 31.50 kg/m    Review of Systems Denies N/V/HB.      Objective:   Physical Exam  Lab Results  Component Value Date   CREATININE 0.64 04/05/2021   BUN 9 04/05/2021   NA 139 04/05/2021   K 4.2 04/05/2021   CL 103 04/05/2021   CO2 26 04/05/2021    Lab Results  Component Value Date   HGBA1C 8.6 (A) 07/16/2021      Assessment & Plan:  Insulin-requiring type 2 DM: uncontrolled.    Patient Instructions  check your blood sugar 4 times a day: before the 3 meals, and at bedtime.  also check if you  have symptoms of your blood sugar being too high or too low.  please keep a record of the readings and bring it to your next appointment here (or you can bring the meter itself).  You can write it on any piece of paper.  please call us sooner if your blood sugar goes below 70, or if you have a lot of readings over 200.   Please continue the same 2 insulins.  I have sent 2 prescriptions to your pharmacy, to increase the Trulicity, and for the continuous glucose monitor.   Applying benzion before putting on the sensor will help it fall off.   Please come back for a follow-up appointment in January.

## 2021-09-18 ENCOUNTER — Ambulatory Visit: Payer: Medicare PPO | Admitting: Endocrinology

## 2021-09-18 ENCOUNTER — Other Ambulatory Visit: Payer: Self-pay

## 2021-09-18 ENCOUNTER — Other Ambulatory Visit: Payer: Self-pay | Admitting: Endocrinology

## 2021-09-18 VITALS — BP 136/74 | HR 99 | Ht 63.0 in | Wt 175.2 lb

## 2021-09-18 DIAGNOSIS — E1149 Type 2 diabetes mellitus with other diabetic neurological complication: Secondary | ICD-10-CM | POA: Diagnosis not present

## 2021-09-18 DIAGNOSIS — Z794 Long term (current) use of insulin: Secondary | ICD-10-CM

## 2021-09-18 LAB — POCT GLYCOSYLATED HEMOGLOBIN (HGB A1C): Hemoglobin A1C: 9 % — AB (ref 4.0–5.6)

## 2021-09-18 MED ORDER — TRULICITY 1.5 MG/0.5ML ~~LOC~~ SOAJ: 1.5000 mg | SUBCUTANEOUS | 3 refills | Status: DC

## 2021-09-18 MED ORDER — NOVOLOG FLEXPEN 100 UNIT/ML ~~LOC~~ SOPN: PEN_INJECTOR | SUBCUTANEOUS | 11 refills | Status: DC

## 2021-09-18 MED ORDER — LEVEMIR FLEXTOUCH 100 UNIT/ML ~~LOC~~ SOPN: 14.0000 [IU] | PEN_INJECTOR | Freq: Every day | SUBCUTANEOUS | 1 refills | Status: DC

## 2021-09-18 MED ORDER — DEXCOM G6 SENSOR MISC: 1.0000 | 3 refills | Status: DC

## 2021-09-18 MED ORDER — DEXCOM G6 TRANSMITTER MISC: 1.0000 | Freq: Once | 1 refills | Status: AC

## 2021-09-18 MED ORDER — DEXCOM G6 RECEIVER DEVI: 1.0000 | Freq: Once | 1 refills | Status: AC

## 2021-09-18 NOTE — Progress Notes (Signed)
Subjective:    Patient ID: Brittany [name], female    DOB: 06-30-1961, 61 y.o.   MRN: [id number]  HPI Pt returns for f/u of diabetes mellitus: DM type: Insulin-requiring type 2 Dx'ed: 0315 Complications: CVA Therapy: insulin since 9458, and Trulicity.   GDM: 1987 DKA: never Severe hypoglycemia: never Pancreatitis: never Pancreatic imaging: normal on 2019 CT.   [place]: husband gives pt her insulin (due to CVA--he says he knows how to use syringe and vial).   Other: she gets multiple daily injections.   Interval history: she brings her meter with her cbg's which I have reviewed today.  cbg varies from 156-300.  There is no trend throughout the day.  husb says he seldom misses giving her the insulin.  However, husb says pharmacy gives pt 5.92 mg of trulicity.  No new sxs.   No recent steroids.   Past Medical History:  Diagnosis Date   Asthma    Cataract    Phreesia 10/03/2020   Chronic lower back pain    Depression    Depression    Phreesia 10/03/2020   Diabetes mellitus without complication ([place])    Phreesia 10/03/2020   Family history of adverse reaction to anesthesia    Mother has trouble coming out of anesthesia; patient reports she has no problems   Fibromyalgia    High cholesterol    Hypertension    Phreesia 10/03/2020   Presence of permanent cardiac pacemaker    Seizures ([place]) 11/2017   "day after she came home from hospital after having stroke" (01/01/2018)   Stroke ([place]) 12/06/2017   "speech issues since" (01/01/2018)   Type II diabetes mellitus [facility])     Past Surgical History:  Procedure Laterality Date   CHOLECYSTECTOMY     EYE SURGERY N/A    Phreesia 10/03/2020   GAS/FLUID EXCHANGE Right 12/12/2019   Procedure: Gas/Fluid Exchange;  Surgeon: Jalene Mullet, MD;  Location: [place];  Service: Ophthalmology;  Laterality: Right;   INSERT / REPLACE / REMOVE PACEMAKER  01/01/2018   IR CT HEAD LTD  12/06/2017   IR PERCUTANEOUS ART THROMBECTOMY/INFUSION INTRACRANIAL INC  DIAG ANGIO  12/06/2017   LOOP RECORDER INSERTION N/A 12/12/2017   Procedure: LOOP RECORDER INSERTION;  Surgeon: Constance Haw, MD;  Location: [place] [facility];  Service: Cardiovascular;  Laterality: N/A;   LOOP RECORDER REMOVAL  01/01/2018   LOOP RECORDER REMOVAL N/A 01/01/2018   Procedure: LOOP RECORDER REMOVAL;  Surgeon: Constance Haw, MD;  Location: [place] [facility];  Service: Cardiovascular;  Laterality: N/A;   NECK SURGERY     PACEMAKER IMPLANT N/A 01/01/2018   Procedure: PACEMAKER IMPLANT;  Surgeon: Constance Haw, MD;  Location: [place] [facility];  Service: Cardiovascular;  Laterality: N/A;   PHOTOCOAGULATION Right 12/12/2019   Procedure: Photocoagulation;  Surgeon: Jalene Mullet, MD;  Location: [place];  Service: Ophthalmology;  Laterality: Right;   RADIOLOGY WITH ANESTHESIA N/A 12/06/2017   Procedure: CODE STROKE;  Surgeon: Luanne Bras, MD;  Location: [place];  Service: Radiology;  Laterality: N/A;   SCLERAL BUCKLE WITH POSSIBLE 25 GAUGE PARS PLANA VITRECTOMY Right 12/12/2019   Procedure: SCLERAL BUCKLE WITH  PARS PLANA VITRECTOMY RETINAL DETACHMENT REPAIR;  Surgeon: Jalene Mullet, MD;  Location: [place];  Service: Ophthalmology;  Laterality: Right;   TEE WITHOUT CARDIOVERSION N/A 12/10/2017   Procedure: TRANSESOPHAGEAL ECHOCARDIOGRAM (TEE);  Surgeon: Jerline Pain, MD;  Location: [facility];  Service: Cardiovascular;  Laterality: N/A;   THROMBECTOMY FEMORAL ARTERY  Right 12/07/2017   Procedure: THROMBECTOMY RIGHT FEMORAL ARTERY;  Surgeon: Waynetta Sandy, MD;  Location: [facility] OR;  Service: Vascular;  Laterality: Right;   TONSILLECTOMY      Social History   Socioeconomic History   Marital status: Married    Spouse name: [name]   Number of children: 2   Years of education: 14   Highest education level: Not on file  Occupational History   Not on file  Tobacco Use   Smoking status: Every Day    Packs/day: 1.00    Years: 38.00    Pack years: 38.00     Types: Cigarettes   Smokeless tobacco: Never  Vaping Use   Vaping Use: Never used  Substance and Sexual Activity   Alcohol use: Yes    Comment: 01/01/2018 "nothing since stroke 11/2017"   Drug use: No   Sexual activity: Not Currently  Other Topics Concern   Not on file  Social History Narrative   Patient is married ([name]) and lives at home with her family   Patient has two children.   Patient works at [employer].   Patient has a college education.   Patient is right-handed.   Patient drinks 3 cups of coffee M-F.      Social Determinants of Health   Financial Resource Strain: Not on file  Food Insecurity: Not on file  Transportation Needs: Not on file  Physical Activity: Not on file  Stress: Not on file  Social Connections: Not on file  Intimate Partner Violence: Not on file    Current Outpatient Medications on File Prior to Visit  Medication Sig Dispense Refill   acetaminophen (TYLENOL) 500 MG tablet Take 1,000 mg by mouth every 6 (six) hours as needed for fever (pain).     B Complex-Biotin-FA (MULTI-B COMPLEX) CAPS Take one a day ,B complex  multivitamin over the counter or use prenatal vitamin. 90 capsule 0   BD PEN NEEDLE NANO 2ND GEN 32G X 4 MM MISC USE FOUR TIMES DAILY 400 each 3   Blood Glucose Monitoring Suppl (ACCU-CHEK GUIDE ME) w/Device KIT 1 each by Does not apply route 4 (four) times daily. E11.9 1 kit 0   clopidogrel (PLAVIX) 75 MG tablet Take 1 tablet (75 mg total) by mouth daily. 90 tablet 3   irbesartan (AVAPRO) 150 MG tablet TAKE 1 TABLET(150 MG) BY MOUTH DAILY 90 tablet 3   Lancets (ONETOUCH DELICA PLUS AJOINO67E) MISC USE FOUR TIMES DAILY 400 each 3   levETIRAcetam (KEPPRA) 500 MG tablet Take 1 tablet (500 mg total) by mouth 2 (two) times daily. 180 tablet 3   LORazepam (ATIVAN) 0.5 MG tablet Take 0.5-1 tablets (0.25-0.5 mg total) by mouth 2 (two) times daily as needed for anxiety (anxiety and tremors (social situations)). 45 tablet 0   primidone  (MYSOLINE) 250 MG tablet Take 1 tablet (250 mg total) by mouth daily. TAKE 1 TABLET(250 MG) BY MOUTH DAILY 90 tablet 3   rosuvastatin (CRESTOR) 20 MG tablet Take 1 tablet (20 mg total) by mouth daily. 90 tablet 3   sertraline (ZOLOFT) 100 MG tablet TAKE 2 TABLETS(200 MG) BY MOUTH AT BEDTIME 180 tablet 1   varenicline (CHANTIX CONTINUING MONTH PAK) 1 MG tablet Take 1 tablet (1 mg total) by mouth 2 (two) times daily. 60 tablet 2   varenicline (CHANTIX STARTING MONTH PAK) 0.5 MG X 11 & 1 MG X 42 tablet Take one 0.5 mg tablet by mouth once daily for 3 days, then  increase to one 0.5 mg tablet twice daily for 4 days, then increase to one 1 mg tablet twice daily. 53 tablet 0   No current facility-administered medications on file prior to visit.    No Known Allergies  Family History  Problem Relation Age of Onset   Cancer Mother    Diabetes Mother    Heart disease Mother    Parkinsonism Mother    Diabetes Father    Heart disease Father    Dementia Father    Cancer Maternal Grandmother 32       lung   COPD Maternal Aunt     BP 136/74    Pulse 99    Ht 5' 3"  (1.6 m)    Wt 175 lb 3.2 oz (79.5 kg)    SpO2 95%    BMI 31.04 kg/m   Review of Systems She denies hypoglycemia/N/V/HB    Objective:   Physical Exam    Lab Results  Component Value Date   HGBA1C 9.0 (A) 09/18/2021      Assessment & Plan:  Insulin-requiring type 2 DM: uncontrolled.     Patient Instructions  check your blood sugar 4 times a day: before the 3 meals, and at bedtime.  also check if you have symptoms of your blood sugar being too high or too low.  please keep a record of the readings and bring it to your next appointment here (or you can bring the meter itself).  You can write it on any piece of paper.  please call us sooner if your blood sugar goes below 70, or if you have a lot of readings over 200.   I have sent 4 prescriptions to your pharmacy, to increase the Trulicity, increase both insulins, and for the  continuous glucose monitor.   Applying benzion before putting on the sensor will help it fall off.   Please come back for a follow-up appointment in 1-2 months.

## 2021-09-18 NOTE — Patient Instructions (Addendum)
check your blood sugar 4 times a day: before the 3 meals, and at bedtime.  also check if you have symptoms of your blood sugar being too high or too low.  please keep a record of the readings and bring it to your next appointment here (or you can bring the meter itself).  You can write it on any piece of paper.  please call us sooner if your blood sugar goes below 70, or if you have a lot of readings over 200.   I have sent 4 prescriptions to your pharmacy, to increase the Trulicity, increase both insulins, and for the continuous glucose monitor.   Applying benzion before putting on the sensor will help it fall off.   Please come back for a follow-up appointment in 1-2 months.

## 2021-10-02 ENCOUNTER — Ambulatory Visit (INDEPENDENT_AMBULATORY_CARE_PROVIDER_SITE_OTHER): Payer: Medicare PPO

## 2021-10-02 DIAGNOSIS — E119 Type 2 diabetes mellitus without complications: Secondary | ICD-10-CM | POA: Diagnosis not present

## 2021-10-02 DIAGNOSIS — I442 Atrioventricular block, complete: Secondary | ICD-10-CM | POA: Diagnosis not present

## 2021-10-02 DIAGNOSIS — H524 Presbyopia: Secondary | ICD-10-CM | POA: Diagnosis not present

## 2021-10-02 DIAGNOSIS — Z961 Presence of intraocular lens: Secondary | ICD-10-CM | POA: Diagnosis not present

## 2021-10-02 LAB — CUP PACEART REMOTE DEVICE CHECK
Battery Remaining Longevity: 138 mo
Battery Voltage: 3.02 V
Brady Statistic AP VP Percent: 0.02 %
Brady Statistic AP VS Percent: 0.03 %
Brady Statistic AS VP Percent: 11.16 %
Brady Statistic AS VS Percent: 88.8 %
Brady Statistic RA Percent Paced: 0.05 %
Brady Statistic RV Percent Paced: 11.18 %
Date Time Interrogation Session: 20230123234611
Implantable Lead Implant Date: 20190425
Implantable Lead Implant Date: 20190425
Implantable Lead Location: 753859
Implantable Lead Location: 753860
Implantable Lead Model: 5076
Implantable Lead Model: 5076
Implantable Pulse Generator Implant Date: 20190425
Lead Channel Impedance Value: 342 Ohm
Lead Channel Impedance Value: 361 Ohm
Lead Channel Impedance Value: 399 Ohm
Lead Channel Impedance Value: 437 Ohm
Lead Channel Pacing Threshold Amplitude: 0.625 V
Lead Channel Pacing Threshold Amplitude: 0.625 V
Lead Channel Pacing Threshold Pulse Width: 0.4 ms
Lead Channel Pacing Threshold Pulse Width: 0.4 ms
Lead Channel Sensing Intrinsic Amplitude: 11.75 mV
Lead Channel Sensing Intrinsic Amplitude: 11.75 mV
Lead Channel Sensing Intrinsic Amplitude: 2.375 mV
Lead Channel Sensing Intrinsic Amplitude: 2.375 mV
Lead Channel Setting Pacing Amplitude: 1.5 V
Lead Channel Setting Pacing Amplitude: 2.5 V
Lead Channel Setting Pacing Pulse Width: 0.4 ms
Lead Channel Setting Sensing Sensitivity: 2 mV

## 2021-10-02 LAB — HM DIABETES EYE EXAM

## 2021-10-03 ENCOUNTER — Telehealth: Payer: Self-pay | Admitting: Endocrinology

## 2021-10-03 NOTE — Telephone Encounter (Signed)
Patient called concerning being set up for CGM and has questions. Please call patient at [phone].

## 2021-10-03 NOTE — Telephone Encounter (Signed)
Spoke with pt a gave them [facility] medical phone# for them to F/U with the process.

## 2021-10-04 ENCOUNTER — Encounter: Payer: Self-pay | Admitting: Family Medicine

## 2021-10-05 ENCOUNTER — Other Ambulatory Visit: Payer: Self-pay | Admitting: Family Medicine

## 2021-10-05 DIAGNOSIS — E785 Hyperlipidemia, unspecified: Secondary | ICD-10-CM

## 2021-10-08 ENCOUNTER — Ambulatory Visit: Payer: Medicare PPO | Admitting: Family Medicine

## 2021-10-08 ENCOUNTER — Encounter: Payer: Self-pay | Admitting: Family Medicine

## 2021-10-08 VITALS — BP 118/70 | HR 85 | Temp 98.4°F | Resp 17 | Ht 63.0 in | Wt 177.0 lb

## 2021-10-08 DIAGNOSIS — I10 Essential (primary) hypertension: Secondary | ICD-10-CM

## 2021-10-08 DIAGNOSIS — E1149 Type 2 diabetes mellitus with other diabetic neurological complication: Secondary | ICD-10-CM

## 2021-10-08 DIAGNOSIS — F331 Major depressive disorder, recurrent, moderate: Secondary | ICD-10-CM | POA: Diagnosis not present

## 2021-10-08 DIAGNOSIS — E785 Hyperlipidemia, unspecified: Secondary | ICD-10-CM

## 2021-10-08 DIAGNOSIS — Z794 Long term (current) use of insulin: Secondary | ICD-10-CM

## 2021-10-08 DIAGNOSIS — I6930 Unspecified sequelae of cerebral infarction: Secondary | ICD-10-CM

## 2021-10-08 LAB — LIPID PANEL
Cholesterol: 129 mg/dL (ref 0–200)
HDL: 51.4 mg/dL (ref >39.00)
LDL Cholesterol: 55 mg/dL (ref 0–99)
NonHDL: 77.23
Total CHOL/HDL Ratio: 3
Triglycerides: 111 mg/dL (ref 0.0–149.0)
VLDL: 22.2 mg/dL (ref 0.0–40.0)

## 2021-10-08 LAB — COMPREHENSIVE METABOLIC PANEL
ALT: 11 U/L (ref 0–35)
AST: 11 U/L (ref 0–37)
Albumin: 4.4 g/dL (ref 3.5–5.2)
Alkaline Phosphatase: 122 U/L — ABNORMAL HIGH (ref 39–117)
BUN: 7 mg/dL (ref 6–23)
CO2: 27 mEq/L (ref 19–32)
Calcium: 9.5 mg/dL (ref 8.4–10.5)
Chloride: 103 mEq/L (ref 96–112)
Creatinine, Ser: 0.65 mg/dL (ref 0.40–1.20)
GFR: 94.36 mL/min (ref >60.00)
Glucose, Bld: 148 mg/dL — ABNORMAL HIGH (ref 70–99)
Potassium: 4.4 mEq/L (ref 3.5–5.1)
Sodium: 139 mEq/L (ref 135–145)
Total Bilirubin: 0.4 mg/dL (ref 0.2–1.2)
Total Protein: 7.4 g/dL (ref 6.0–8.3)

## 2021-10-08 MED ORDER — SERTRALINE HCL 100 MG PO TABS: ORAL_TABLET | ORAL | 1 refills | Status: DC

## 2021-10-08 NOTE — Progress Notes (Signed)
Subjective:  Patient ID: Brittany [name], female    DOB: May 09, 1961  Age: 61 y.o. MRN: [id number]  CC:  Chief Complaint  Patient presents with   Depression    Pt reports medication has worked well for her no complaints today    Diabetes    Recent refills on medications, pt reports she doesn't feel trulicity is changing anything for her would like to discuss no other concerns, recent A1c 9.0 Dr Loanne Drilling     HPI Brittany [name] presents for   Depression: Last discussed in July 2022.  Was stable on sertraline 200 mg daily, lorazepam as needed, and was meeting with therapist. Depression doing ok.  Not seeing therapist - told not needed to be seen anymore. Not taking lorazepam.   Depression screen [facility] 2/9 10/08/2021 10/06/2020 08/14/2020 11/22/2019 09/03/2018  Decreased Interest 0 0 0 0 0  Down, Depressed, Hopeless 0 0 0 0 0  PHQ - 2 Score 0 0 0 0 0  Altered sleeping 1 - - - -  Tired, decreased energy 1 - - - -  Change in appetite 1 - - - -  Feeling bad or failure about yourself  0 - - - -  Trouble concentrating 0 - - - -  Moving slowly or fidgety/restless 1 - - - -  Suicidal thoughts 0 - - - -  PHQ-9 Score 4 - - - -  Difficult doing work/chores - - - - -  Some recent data might be hidden   Hyperlipidemia: See her presents with previous CVA, treated with Crestor 20 mg daily.  Stable LDL at 54 in July.no new side effects.  Last ate over 6 hours ago.  Lab Results  Component Value Date   CHOL 124 04/05/2021   HDL 50.30 04/05/2021   LDLCALC 54 04/05/2021   LDLDIRECT 61 12/03/2013   TRIG 101.0 04/05/2021   CHOLHDL 2 04/05/2021   Lab Results  Component Value Date   ALT 11 04/05/2021   AST 10 04/05/2021   ALKPHOS 127 (H) 04/05/2021   BILITOT 0.4 04/05/2021   Cardiac Followed by cardiology with history of a B block, third-degree, prior CVA, hypertension.  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  Irbesartan 150 mg daily.   Neuro follow up  appt 04/16/21 -  primidone for essential tremor. On Plavix after CVA with residual deficit.keppra for sz. prophylaxis.  BP Readings from Last 3 Encounters:  10/08/21 118/70  09/18/21 136/74  07/16/21 130/70   Diabetes: Followed by endocrinology, Dr. Loanne Drilling.  Appointment January 10.  Insulins were increased as well as Trulicity. Frustrated about elevated readings. Increased trulicity in November.  No recent lows.  Lab Results  Component Value Date   HGBA1C 9.0 (A) 09/18/2021   HGBA1C 8.6 (A) 07/16/2021   HGBA1C 8.4 (A) 05/02/2021   Lab Results  Component Value Date   MICROALBUR 1.6 07/13/2016   LDLCALC 54 04/05/2021   CREATININE 0.64 04/05/2021   Nicotine addiction: Quit smoking in past month. Doing ok.    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 ([place]) s/p IV tPA and mechanical intervention 12/12/2017   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 cerebrovascular accident (CVA) ([place]) 12/12/2017   Acute right hemiparesis ([place]) 12/12/2017   History of completed  stroke 12/12/2017   Leukocytosis 12/12/2017   Hypokalemia 12/12/2017   Hypotension    Middle cerebral artery embolism, left 12/06/2017   Poor compliance with CPAP treatment 03/04/2017   OSA and COPD overlap syndrome ([place]) 03/04/2017   Simple chronic bronchitis (HCC) 03/04/2017   MCI (mild cognitive impairment) 11/27/2016   Left sided lacunar infarction ([place]) 03/11/2014   Cerebrovascular small vessel disease 03/11/2014   Insomnia, idiopathic 12/20/2013   Snoring 12/06/2013   Essential hypertension, benign 10/26/2012   Hyperlipidemia LDL goal <70 10/26/2012   Polypharmacy 10/26/2012   DM (diabetes mellitus) ([place]) 10/13/2011   Tremor 10/13/2011   GERD (gastroesophageal reflux disease)  10/13/2011   Depression 10/13/2011   Past Medical History:  Diagnosis Date   Asthma    Cataract    Phreesia 10/03/2020   Chronic lower back pain    Depression    Depression    Phreesia 10/03/2020   Diabetes mellitus without complication ([place])    Phreesia 10/03/2020   Family history of adverse reaction to anesthesia    Mother has trouble coming out of anesthesia; patient reports she has no problems   Fibromyalgia    High cholesterol    Hypertension    Phreesia 10/03/2020   Presence of permanent cardiac pacemaker    Seizures ([place]) 11/2017   "day after she came home from hospital after having stroke" (01/01/2018)   Stroke ([place]) 12/06/2017   "speech issues since" (01/01/2018)   Type II diabetes mellitus [facility])    Past Surgical History:  Procedure Laterality Date   CHOLECYSTECTOMY     EYE SURGERY N/A    Phreesia 10/03/2020   GAS/FLUID EXCHANGE Right 12/12/2019   Procedure: Gas/Fluid Exchange;  Surgeon: Jalene Mullet, MD;  Location: [place];  Service: Ophthalmology;  Laterality: Right;   INSERT / REPLACE / REMOVE PACEMAKER  01/01/2018   IR CT HEAD LTD  12/06/2017   IR PERCUTANEOUS ART THROMBECTOMY/INFUSION INTRACRANIAL INC DIAG ANGIO  12/06/2017   LOOP RECORDER INSERTION N/A 12/12/2017   Procedure: LOOP RECORDER INSERTION;  Surgeon: Constance Haw, MD;  Location: [place] [facility];  Service: Cardiovascular;  Laterality: N/A;   LOOP RECORDER REMOVAL  01/01/2018   LOOP RECORDER REMOVAL N/A 01/01/2018   Procedure: LOOP RECORDER REMOVAL;  Surgeon: Constance Haw, MD;  Location: [place] [facility];  Service: Cardiovascular;  Laterality: N/A;   NECK SURGERY     PACEMAKER IMPLANT N/A 01/01/2018   Procedure: PACEMAKER IMPLANT;  Surgeon: Constance Haw, MD;  Location: [place] [facility];  Service: Cardiovascular;  Laterality: N/A;   PHOTOCOAGULATION Right 12/12/2019   Procedure: Photocoagulation;  Surgeon: Jalene Mullet, MD;  Location: [place];  Service: Ophthalmology;   Laterality: Right;   RADIOLOGY WITH ANESTHESIA N/A 12/06/2017   Procedure: CODE STROKE;  Surgeon: Luanne Bras, MD;  Location: [place];  Service: Radiology;  Laterality: N/A;   SCLERAL BUCKLE WITH POSSIBLE 25 GAUGE PARS PLANA VITRECTOMY Right 12/12/2019   Procedure: SCLERAL BUCKLE WITH  PARS PLANA VITRECTOMY RETINAL DETACHMENT REPAIR;  Surgeon: Jalene Mullet, MD;  Location: [place];  Service: Ophthalmology;  Laterality: Right;   TEE WITHOUT CARDIOVERSION N/A 12/10/2017   Procedure: TRANSESOPHAGEAL ECHOCARDIOGRAM (TEE);  Surgeon: Jerline Pain, MD;  Location: [facility] ENDOSCOPY;  Service: Cardiovascular;  Laterality: N/A;   THROMBECTOMY FEMORAL ARTERY Right 12/07/2017   Procedure: THROMBECTOMY RIGHT FEMORAL ARTERY;  Surgeon: Waynetta Sandy, MD;  Location: [place];  Service: Vascular;  Laterality: Right;   TONSILLECTOMY     No Known  Allergies Prior to Admission medications   Medication Sig Start Date End Date Taking? Authorizing Provider  ACCU-CHEK GUIDE test strip USE AS DIRECTED FOUR TIMES DAILY 09/18/21  Yes Renato Shin, MD  acetaminophen (TYLENOL) 500 MG tablet Take 1,000 mg by mouth every 6 (six) hours as needed for fever (pain).   Yes [provider]  B Complex-Biotin-FA (MULTI-B COMPLEX) CAPS Take one a day ,B complex  multivitamin over the counter or use prenatal vitamin. 08/28/16  Yes Dohmeier, Asencion Partridge, MD  BD PEN NEEDLE NANO 2ND GEN 32G X 4 MM MISC USE FOUR TIMES DAILY 09/17/20  Yes Renato Shin, MD  Blood Glucose Monitoring Suppl (ACCU-CHEK GUIDE ME) w/Device KIT 1 each by Does not apply route 4 (four) times daily. E11.9 05/22/20  Yes Renato Shin, MD  clopidogrel (PLAVIX) 75 MG tablet Take 1 tablet (75 mg total) by mouth daily. 10/06/20  Yes Wendie Agreste, MD  Continuous Blood Gluc Sensor (DEXCOM G6 SENSOR) MISC 1 Device by Does not apply route See admin instructions. Change every 10 days 09/18/21  Yes Renato Shin, MD  Dulaglutide (TRULICITY) 1.5 YY/4.8GN SOPN Inject 1.5  mg into the skin once a week. 09/18/21  Yes Renato Shin, MD  insulin aspart (NOVOLOG FLEXPEN) 100 UNIT/ML FlexPen 3 times a day (just before each meal) 5-7-7 units. 09/18/21  Yes Renato Shin, MD  insulin detemir (LEVEMIR FLEXTOUCH) 100 UNIT/ML FlexPen Inject 14 Units into the skin at bedtime. 09/18/21  Yes Renato Shin, MD  irbesartan (AVAPRO) 150 MG tablet TAKE 1 TABLET(150 MG) BY MOUTH DAILY 10/06/20  Yes Wendie Agreste, MD  Lancets [facility] DELICA PLUS [id number]) [place] 07/10/21  Yes Renato Shin, MD  levETIRAcetam (KEPPRA) 500 MG tablet Take 1 tablet (500 mg total) by mouth 2 (two) times daily. 10/16/20  Yes McCue, Janett Billow, NP  LORazepam (ATIVAN) 0.5 MG tablet Take 0.5-1 tablets (0.25-0.5 mg total) by mouth 2 (two) times daily as needed for anxiety (anxiety and tremors (social situations)). 10/16/20  Yes McCue, Janett Billow, NP  primidone (MYSOLINE) 250 MG tablet Take 1 tablet (250 mg total) by mouth daily. TAKE 1 TABLET(250 MG) BY MOUTH DAILY 04/16/21  Yes McCue, Janett Billow, NP  rosuvastatin (CRESTOR) 20 MG tablet Take 1 tablet (20 mg total) by mouth daily. 05/22/21  Yes Wendie Agreste, MD  sertraline (ZOLOFT) 100 MG tablet TAKE 2 TABLETS(200 MG) BY MOUTH AT BEDTIME 04/05/21  Yes Wendie Agreste, MD   Social History   Socioeconomic History   Marital status: Married    Spouse name: [name]   Number of children: 2   Years of education: 14   Highest education level: Not on file  Occupational History   Not on file  Tobacco Use   Smoking status: Former    Packs/day: 1.00    Years: 38.00    Pack years: 38.00    Types: Cigarettes   Smokeless tobacco: Never   Tobacco comments:    Pt used chantix July till January, quit first of the year   Vaping Use   Vaping Use: Never used  Substance and Sexual Activity   Alcohol use: Yes    Comment: 01/01/2018 "nothing since stroke 11/2017"   Drug use: No   Sexual activity: Not Currently  Other Topics Concern   Not on file  Social  History Narrative   Patient is married ([name]) and lives at home with her family   Patient has two children.   Patient works at [employer].  Patient has a college education.   Patient is right-handed.   Patient drinks 3 cups of coffee M-F.      Social Determinants of Health   Financial Resource Strain: Not on file  Food Insecurity: Not on file  Transportation Needs: Not on file  Physical Activity: Not on file  Stress: Not on file  Social Connections: Not on file  Intimate Partner Violence: Not on file    Review of Systems  Constitutional:  Negative for fatigue and unexpected weight change.  Respiratory:  Negative for chest tightness and shortness of breath.   Cardiovascular:  Negative for chest pain, palpitations and leg swelling.  Gastrointestinal:  Negative for abdominal pain and blood in stool.  Neurological:  Negative for dizziness, syncope, light-headedness and headaches.    Objective:   Vitals:   10/08/21 1322  BP: 118/70  Pulse: 85  Resp: 17  Temp: 98.4 F (36.9 C)  TempSrc: Temporal  SpO2: 95%  Weight: 177 lb (80.3 kg)  Height: _0  (1.6 m)     Physical Exam Vitals reviewed.  Constitutional:      Appearance: Normal appearance. She is well-developed.  HENT:     Head: Normocephalic and atraumatic.  Eyes:     Conjunctiva/sclera: Conjunctivae normal.     Pupils: Pupils are equal, round, and reactive to light.  Neck:     Vascular: No carotid bruit.  Cardiovascular:     Rate and Rhythm: Normal rate and regular rhythm.     Heart sounds: Normal heart sounds.  Pulmonary:     Effort: Pulmonary effort is normal.     Breath sounds: Normal breath sounds.  Abdominal:     Palpations: Abdomen is soft. There is no pulsatile mass.     Tenderness: There is no abdominal tenderness.  Musculoskeletal:     Right lower leg: No edema.     Left lower leg: No edema.  Skin:    General: Skin is warm and dry.  Neurological:     Mental Status: She is alert  and oriented to person, place, and time.  Psychiatric:        Mood and Affect: Mood normal.        Behavior: Behavior normal.     38 minutes spent during visit, including chart review, counseling and assimilation of information, exam, discussion of plan, and chart completion.    Assessment & Plan:  Brittany [name] is a 61 y.o. female . Moderate episode of recurrent major depressive disorder ([place]) - Plan: sertraline (ZOLOFT) 100 MG tablet  - reports overall stable symptoms. Continue same dose sertraline  Hyperlipidemia LDL goal <70 - Plan: Comprehensive metabolic panel, Lipid panel  - well controlled prior with secondary prevention from prior CVA. Stable. Continue same dose crestor.  History of CVA with residual deficit, tremor.  -no new symptoms, continue plavix, primidone, neuro follow up. Secondary prevention with diabetes control - recent increased meds, lipid control.   Type 2 diabetes mellitus with other neurologic complication, with long-term current use of insulin ([place])  - managed by endocrinology with recent increased meds for improved control. Understand frustration with elevated A1c, but discussed common need for adjustment with meds in course of diabetes and recent increases should help. Continue follow up with endocrinology.   Essential hypertension, benign  - stable control. Continue same regimen.   Meds ordered this encounter  Medications   sertraline (ZOLOFT) 100 MG tablet    Sig: TAKE 2 TABLETS(200 MG) BY MOUTH AT BEDTIME  Dispense:  180 tablet    Refill:  1   Patient Instructions  I am hopeful that recent changes by endocrinologist will help your diabetes control. Let me know if you would like to get a second opinion, but suspect the numbers will improve with recent changes.   Great news about quitting smoking!  Keep up the good work.      Managing the Challenge of Quitting Smoking Quitting smoking is a physical and mental challenge. You will face  cravings, withdrawal symptoms, and temptation. Before quitting, work with your health care provider to make a plan that can help you manage quitting. Preparation can help you quit and keep you from giving in. How to manage lifestyle changes Managing stress Stress can make you want to smoke, and wanting to smoke may cause stress. It is important to find ways to manage your stress. You might try some of the following: Practice relaxation techniques. Breathe slowly and deeply, in through your nose and out through your mouth. Listen to music. Soak in a bath or take a shower. Imagine a peaceful place or vacation. Get some support. Talk with family or friends about your stress. Join a support group. Talk with a counselor or therapist. Get some physical activity. Go for a walk, run, or bike ride. Play a favorite sport. Practice yoga.  Medicines Talk with your health care provider about medicines that might help you deal with cravings and make quitting easier for you. Relationships Social situations can be difficult when you are quitting smoking. To manage this, you can: Avoid parties and other social situations where people might be smoking. Avoid alcohol. Leave right away if you have the urge to smoke. Explain to your family and friends that you are quitting smoking. Ask for support and let them know you might be a bit grumpy. Plan activities where smoking is not an option. General instructions Be aware that many people gain weight after they quit smoking. However, not everyone does. To keep from gaining weight, have a plan in place before you quit and stick to the plan after you quit. Your plan should include: Having healthy snacks. When you have a craving, it may help to: Eat popcorn, carrots, celery, or other cut vegetables. Chew sugar-free gum. Changing how you eat. Eat small portion sizes at meals. Eat 4-6 small meals throughout the day instead of 1-2 large meals a day. Be mindful  when you eat. Do not watch television or do other things that might distract you as you eat. Exercising regularly. Make time to exercise each day. If you do not have time for a long workout, do short bouts of exercise for 5-10 minutes several times a day. Do some form of strengthening exercise, such as weight lifting. Do some exercise that gets your heart beating and causes you to breathe deeply, such as walking fast, running, swimming, or biking. This is very important. Drinking plenty of water or other low-calorie or no-calorie drinks. Drink 6-8 glasses of water daily.  How to recognize withdrawal symptoms Your body and mind may experience discomfort as you try to get used to not having nicotine in your system. These effects are called withdrawal symptoms. They may include: Feeling hungrier than normal. Having trouble concentrating. Feeling irritable or restless. Having trouble sleeping. Feeling depressed. Craving a cigarette. To manage withdrawal symptoms: Avoid places, people, and activities that trigger your cravings. Remember why you want to quit. Get plenty of sleep. Avoid coffee and other caffeinated drinks. These may  worsen some of your symptoms. These symptoms may surprise you. But be assured that they are normal to have when quitting smoking. How to manage cravings Come up with a plan for how to deal with your cravings. The plan should include the following: A definition of the specific situation you want to deal with. An alternative action you will take. A clear idea for how this action will help. The name of someone who might help you with this. Cravings usually last for 5-10 minutes. Consider taking the following actions to help you with your plan to deal with cravings: Keep your mouth busy. Chew sugar-free gum. Suck on hard candies or a straw. Brush your teeth. Keep your hands and body busy. Change to a different activity right away. Squeeze or play with a ball. Do  an activity or a hobby, such as making bead jewelry, practicing needlepoint, or working with wood. Mix up your normal routine. Take a short exercise break. Go for a quick walk or run up and down stairs. Focus on doing something kind or helpful for someone else. Call a friend or family member to talk during a craving. Join a support group. Contact a quitline. Where to find support To get help or find a support group: Call the [facility]'s Smoking Quitline: 1-800-QUIT NOW 407 736 6443) Visit the website of the Substance Abuse and [place]: ktimeonline.com Text QUIT to SmokefreeTXT: 500938 Where to find more information Visit these websites to find more information on quitting smoking: [place]: www.smokefree.gov American Lung Association: www.lung.org American Cancer Society: www.cancer.org Centers for Disease Control and Prevention: http://www.wolf.info/ American Heart Association: www.heart.org Contact a health care provider if: You want to change your plan for quitting. The medicines you are taking are not helping. Your eating feels out of control or you cannot sleep. Get help right away if: You feel depressed or become very anxious. Summary Quitting smoking is a physical and mental challenge. You will face cravings, withdrawal symptoms, and temptation to smoke again. Preparation can help you as you go through these challenges. Try different techniques to manage stress, handle social situations, and prevent weight gain. You can deal with cravings by keeping your mouth busy (such as by chewing gum), keeping your hands and body busy, calling family or friends, or contacting a quitline for people who want to quit smoking. You can deal with withdrawal symptoms by avoiding places where people smoke, getting plenty of rest, and avoiding drinks with caffeine. This information is not intended to replace advice given to you by your health care  provider. Make sure you discuss any questions you have with your health care provider. Document Revised: 05/04/2021 Document Reviewed: 06/15/2019 Elsevier Patient Education  2022 [place],   Merri Ray, MD [facility] 10/08/21 1:43 PM

## 2021-10-08 NOTE — Patient Instructions (Addendum)
I am hopeful that recent changes by endocrinologist will help your diabetes control. Let me know if you would like to get a second opinion, but suspect the numbers will improve with recent changes.   Great news about quitting smoking!  Keep up the good work.      Managing the Challenge of Quitting Smoking Quitting smoking is a physical and mental challenge. You will face cravings, withdrawal symptoms, and temptation. Before quitting, work with your health care provider to make a plan that can help you manage quitting. Preparation can help you quit and keep you from giving in. How to manage lifestyle changes Managing stress Stress can make you want to smoke, and wanting to smoke may cause stress. It is important to find ways to manage your stress. You might try some of the following: Practice relaxation techniques. Breathe slowly and deeply, in through your nose and out through your mouth. Listen to music. Soak in a bath or take a shower. Imagine a peaceful place or vacation. Get some support. Talk with family or friends about your stress. Join a support group. Talk with a counselor or therapist. Get some physical activity. Go for a walk, run, or bike ride. Play a favorite sport. Practice yoga.  Medicines Talk with your health care provider about medicines that might help you deal with cravings and make quitting easier for you. Relationships Social situations can be difficult when you are quitting smoking. To manage this, you can: Avoid parties and other social situations where people might be smoking. Avoid alcohol. Leave right away if you have the urge to smoke. Explain to your family and friends that you are quitting smoking. Ask for support and let them know you might be a bit grumpy. Plan activities where smoking is not an option. General instructions Be aware that many people gain weight after they quit smoking. However, not everyone does. To keep from gaining weight, have a  plan in place before you quit and stick to the plan after you quit. Your plan should include: Having healthy snacks. When you have a craving, it may help to: Eat popcorn, carrots, celery, or other cut vegetables. Chew sugar-free gum. Changing how you eat. Eat small portion sizes at meals. Eat 4-6 small meals throughout the day instead of 1-2 large meals a day. Be mindful when you eat. Do not watch television or do other things that might distract you as you eat. Exercising regularly. Make time to exercise each day. If you do not have time for a long workout, do short bouts of exercise for 5-10 minutes several times a day. Do some form of strengthening exercise, such as weight lifting. Do some exercise that gets your heart beating and causes you to breathe deeply, such as walking fast, running, swimming, or biking. This is very important. Drinking plenty of water or other low-calorie or no-calorie drinks. Drink 6-8 glasses of water daily.  How to recognize withdrawal symptoms Your body and mind may experience discomfort as you try to get used to not having nicotine in your system. These effects are called withdrawal symptoms. They may include: Feeling hungrier than normal. Having trouble concentrating. Feeling irritable or restless. Having trouble sleeping. Feeling depressed. Craving a cigarette. To manage withdrawal symptoms: Avoid places, people, and activities that trigger your cravings. Remember why you want to quit. Get plenty of sleep. Avoid coffee and other caffeinated drinks. These may worsen some of your symptoms. These symptoms may surprise you. But be assured that they are  normal to have when quitting smoking. How to manage cravings Come up with a plan for how to deal with your cravings. The plan should include the following: A definition of the specific situation you want to deal with. An alternative action you will take. A clear idea for how this action will help. The  name of someone who might help you with this. Cravings usually last for 5-10 minutes. Consider taking the following actions to help you with your plan to deal with cravings: Keep your mouth busy. Chew sugar-free gum. Suck on hard candies or a straw. Brush your teeth. Keep your hands and body busy. Change to a different activity right away. Squeeze or play with a ball. Do an activity or a hobby, such as making bead jewelry, practicing needlepoint, or working with wood. Mix up your normal routine. Take a short exercise break. Go for a quick walk or run up and down stairs. Focus on doing something kind or helpful for someone else. Call a friend or family member to talk during a craving. Join a support group. Contact a quitline. Where to find support To get help or find a support group: Call the [facility]'s Smoking Quitline: 1-800-QUIT NOW (534)713-8319) Visit the website of the Substance Abuse and [place]: ktimeonline.com Text QUIT to SmokefreeTXTAZ:4618977 Where to find more information Visit these websites to find more information on quitting smoking: [place]: www.smokefree.gov American Lung Association: www.lung.org American Cancer Society: www.cancer.org Centers for Disease Control and Prevention: http://www.wolf.info/ American Heart Association: www.heart.org Contact a health care provider if: You want to change your plan for quitting. The medicines you are taking are not helping. Your eating feels out of control or you cannot sleep. Get help right away if: You feel depressed or become very anxious. Summary Quitting smoking is a physical and mental challenge. You will face cravings, withdrawal symptoms, and temptation to smoke again. Preparation can help you as you go through these challenges. Try different techniques to manage stress, handle social situations, and prevent weight gain. You can deal with cravings by keeping your  mouth busy (such as by chewing gum), keeping your hands and body busy, calling family or friends, or contacting a quitline for people who want to quit smoking. You can deal with withdrawal symptoms by avoiding places where people smoke, getting plenty of rest, and avoiding drinks with caffeine. This information is not intended to replace advice given to you by your health care provider. Make sure you discuss any questions you have with your health care provider. Document Revised: 05/04/2021 Document Reviewed: 06/15/2019 Elsevier Patient Education  [place].

## 2021-10-09 ENCOUNTER — Encounter: Payer: Self-pay | Admitting: Family Medicine

## 2021-10-11 ENCOUNTER — Other Ambulatory Visit: Payer: Self-pay | Admitting: Family Medicine

## 2021-10-11 ENCOUNTER — Other Ambulatory Visit: Payer: Self-pay | Admitting: *Deleted

## 2021-10-11 DIAGNOSIS — I10 Essential (primary) hypertension: Secondary | ICD-10-CM

## 2021-10-11 DIAGNOSIS — I6930 Unspecified sequelae of cerebral infarction: Secondary | ICD-10-CM

## 2021-10-11 MED ORDER — LEVETIRACETAM 500 MG PO TABS: 500.0000 mg | ORAL_TABLET | Freq: Two times a day (BID) | ORAL | 3 refills | Status: DC

## 2021-10-12 NOTE — Progress Notes (Signed)
Remote pacemaker transmission.   

## 2021-10-19 DIAGNOSIS — E119 Type 2 diabetes mellitus without complications: Secondary | ICD-10-CM | POA: Diagnosis not present

## 2021-10-22 ENCOUNTER — Other Ambulatory Visit: Payer: Self-pay

## 2021-10-22 ENCOUNTER — Encounter: Payer: Self-pay | Admitting: Adult Health

## 2021-10-22 ENCOUNTER — Ambulatory Visit: Payer: Medicare PPO | Admitting: Adult Health

## 2021-10-22 VITALS — BP 147/75 | HR 75 | Ht 63.0 in | Wt 179.0 lb

## 2021-10-22 DIAGNOSIS — F4323 Adjustment disorder with mixed anxiety and depressed mood: Secondary | ICD-10-CM

## 2021-10-22 DIAGNOSIS — G250 Essential tremor: Secondary | ICD-10-CM | POA: Diagnosis not present

## 2021-10-22 DIAGNOSIS — I639 Cerebral infarction, unspecified: Secondary | ICD-10-CM | POA: Diagnosis not present

## 2021-10-22 DIAGNOSIS — I6930 Unspecified sequelae of cerebral infarction: Secondary | ICD-10-CM | POA: Diagnosis not present

## 2021-10-22 DIAGNOSIS — R569 Unspecified convulsions: Secondary | ICD-10-CM | POA: Diagnosis not present

## 2021-10-22 MED ORDER — PRIMIDONE 250 MG PO TABS: 250.0000 mg | ORAL_TABLET | Freq: Every day | ORAL | 3 refills | Status: DC

## 2021-10-22 NOTE — Patient Instructions (Addendum)
Your Plan:  Continue routine follow up with your PCP for stroke risk factor management   Continue primidone for essential tremors  Continue keppra 500mg  twice daily for seizure prevention  Ensure follow up with endocrinology regarding diabetes - discuss possible benefit with diabetes educator      Follow up in 1 year or call earlier if needed     Thank you for coming to see [place] at [facility]. I hope we have been able to provide you high quality care today.  You may receive a patient satisfaction survey over the next few weeks. We would appreciate your feedback and comments so that we may continue to improve ourselves and the health of our patients.

## 2021-10-22 NOTE — Progress Notes (Signed)
[facility] [address] [place]. [place] [address] [phone]       FOLLOW-UP NOTE  [name] Date of Birth:  11/05/1960 Medical Record Number:  [id number]   Reason for visit: Follow-up stroke   Chief Complaint  Patient presents with   Follow-up    RM 3 with spouse Brittany [name]  Pt is well and stable, states things are about the same. 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 Loanne Drilling for DM management with recent A1c 9.0 - adjustments made to insulin regimen. 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 8 animals  Total score _9 Depression/anxiety Adjustment  disorder -Overall stable with ongoing use of sertraline 200 mg nightly (per PCP).   -ocassional use of ativan PRN with benefit   Essential tremors -stable -Continues on primidone 250 mg daily tolerating well without side effects -Does report worsening with increased anxiety and social situations but overall stable       ROS:   14 system review of systems is positive for those listed in HPI and all other systems negative   PMH:  Past Medical History:  Diagnosis Date   Asthma    Cataract    [employer]reesia 10/03/2020   Chronic lower back pain    Depression    Depression    [employer]reesia 10/03/2020   Diabetes mellitus without complication ([place])    [employer]reesia 10/03/2020   Family history of adverse reaction to anesthesia    Mother has trouble coming out of anesthesia; patient reports she has no problems   Fibromyalgia    High cholesterol    Hypertension    [employer]reesia 10/03/2020   Presence of permanent cardiac pacemaker    Seizures ([place]) 11/2017   "day after she came home from hospital after having stroke" (01/01/2018)   Stroke ([place]) 12/06/2017   "speech issues since" (01/01/2018)   Type II diabetes mellitus ([place])     Social History:  Social History   Socioeconomic History   Marital status: Married    Spouse name: Brittany [name]   Number  of children: 2   Years of education: 14   Highest education level: Not on file  Occupational History   Not on file  Tobacco Use   Smoking status: Former    Packs/day: 1.00    Years: 38.00    Pack years: 38.00    Types: Cigarettes   Smokeless tobacco: Never   Tobacco comments:    Pt used chantix July till January, quit first of the year   Vaping Use   Vaping Use: Never used  Substance and Sexual Activity   Alcohol use: Yes    Comment: 01/01/2018 "nothing since stroke 11/2017"   Drug use: No   Sexual activity: Not Currently  Other Topics Concern   Not on file  Social History Narrative   Patient is married ([name]) and lives at home with her family    Patient has two children.   Patient works at [employer].   Patient has a college education.   Patient is right-handed.   Patient drinks 3 cups of coffee M-F.      Social Determinants of Health   Financial Resource Strain: Not on file  Food Insecurity: Not on file  Transportation Needs: Not on file  Physical Activity: Not on file  Stress: Not on file  Social Connections: Not on file  Intimate Partner Violence: Not on file    Medications:   Current Outpatient Medications on File Prior to Visit  Medication Sig Dispense Refill   ACCU-CHEK GUIDE test strip USE AS DIRECTED FOUR TIMES DAILY 100 strip 5   acetaminophen (TYLENOL) 500 MG tablet Take 1,000 mg by mouth every 6 (six) hours as needed for fever (pain).     B Complex-Biotin-FA (MULTI-B COMPLEX) CAPS Take one a day ,B complex  multivitamin over the counter or use prenatal vitamin. 90 capsule 0   BD PEN NEEDLE NANO 2ND GEN 32G X 4 MM MISC USE FOUR TIMES DAILY 400 each 3   Blood Glucose Monitoring Suppl (ACCU-CHEK GUIDE ME) w/Device KIT 1 each by Does not apply route 4 (four) times daily. E11.9 1 kit 0   clopidogrel (PLAVIX) 75 MG tablet TAKE 1 TABLET(75 MG) BY MOUTH DAILY 90 tablet 3   Continuous Blood Gluc Sensor (DEXCOM G6 SENSOR) MISC 1 Device by Does not apply route See admin instructions. Change every 10 days 9 each 3   Dulaglutide (TRULICITY) 1.5 HF/0.2OV SOPN Inject 1.5 mg into the skin once a week. 6 mL 3   insulin aspart (NOVOLOG FLEXPEN) 100 UNIT/ML FlexPen 3 times a day (just before each meal) 5-7-7 units. 15 mL 11   insulin detemir (LEVEMIR FLEXTOUCH) 100 UNIT/ML FlexPen Inject 14 Units into the skin at bedtime. 45 mL 1   irbesartan (AVAPRO) 150 MG tablet TAKE 1 TABLET(150 MG) BY MOUTH DAILY 90 tablet 3   Lancets (ONETOUCH DELICA PLUS ZCHYIF02D) MISC USE FOUR TIMES DAILY 400 each 3   levETIRAcetam (KEPPRA) 500 MG tablet Take 1 tablet (500 mg total) by mouth 2 (two) times daily. 180 tablet 3   LORazepam  (ATIVAN) 0.5 MG tablet Take 0.5-1 tablets (0.25-0.5 mg total) by mouth 2 (two) times daily as needed for anxiety (anxiety and tremors (social situations)). 45 tablet 0   primidone (MYSOLINE) 250 MG tablet Take 1 tablet (250 mg total) by mouth daily. TAKE 1 TABLET(250 MG) BY MOUTH DAILY 90 tablet 3   rosuvastatin (CRESTOR) 20 MG tablet Take 1 tablet (20 mg total) by mouth daily. 90 tablet 3   sertraline (ZOLOFT)  100 MG tablet TAKE 2 TABLETS(200 MG) BY MOUTH AT BEDTIME 180 tablet 1   No current facility-administered medications on file prior to visit.    Allergies:  No Known Allergies  Today's Vitals   10/22/21 1429  BP: (!) 147/75  Pulse: 75  Weight: 179 lb (81.2 kg)  Height: 5' 3" (1.6 m)   Body mass index is 31.71 kg/m.  General: well developed, well nourished,  pleasant middle-age Caucasian female, seated, in no evident distress Head: head normocephalic and atraumatic.   Neck: supple with no carotid or supraclavicular bruits Cardiovascular: regular rate and rhythm, no murmurs Musculoskeletal: no deformity Skin:  no rash/petichiae Vascular:  Normal pulses all extremities   Neurologic Exam Mental Status: Awake and fully alert.  Mild expressive aphasia with speech hesitancy.  Oriented to place and time. Recent memory impaired and remote memory intact. Attention span, concentration and fund of knowledge appropriate today. Mood and affect appropriate throughout visit Cranial Nerves: Pupils equal, briskly reactive to light. Extraocular movements full without nystagmus. Visual fields full to confrontation. Hearing intact. Facial sensation intact. Face, tongue, palate moves normally and symmetrically.  Motor: Normal bulk and tone. Normal strength in all tested extremity muscles.  Sensory.: intact to touch , pinprick , position and vibratory sensation.  Coordination: Mild tremor head and upper arms R>L with outstretched arms and amplified RUE finger-to-nose testing.  No evidence of lower  extremity tremor or resting tremor.  No evidence of cogwheel rigidity. Gait and Station: Arises from chair without difficulty. Stance is normal. Gait demonstrates normal stride length and balance without use of assistive device Reflexes: 1+ and symmetric. Toes downgoing.      ASSESSMENT/PLAN: 61 year old lady with aphasia secondary to left MCA infarct in April 2019 of cryptogenic etiology treated with IV TPA and mechanical thrombectomy with good recanalization. She also had symptomatic seizure after discharge and residual stroke deficit of expressive aphasia and cognitive impairment currently receiving long-term disability.  Restarted on primidone due to essential tremors which has been stable.  Great difficulty with poststroke depression/anxiety and adjustment disorder which has since stabilized    Left MCA stroke -Continue Plavix and Crestor for secondary stroke prevention -Close PCP follow-up for aggressive stroke risk factor management including HTN with BP goal<130/90, HLD with LDL goal<70 and DM with A1c goal<7. Advised to discuss possible benefit of diabetic educator to help with appropriate diet which is likely largely contributing to uncontrolled DM -pacer routinely monitored by cardiology -negative for atrial fibrillation   Post stroke seizure -Continue Keppra 500 mg twice daily for seizure prophylaxis - refill provided -No recurrent seizure episodes.  Advised to call with any seizure symptoms or events   Essential tremors -Continue primidone 250 mg daily -refill provided -Worse with increased anxiety and social situations - recommend trialing low-dose Ativan (as already prescribed and tolerates without side effects) prior to social events or increased stressful situation for possible benefit.     Follow-up in 1 year or call earlier if needed   CC:  Wendie Agreste, MD     I spent 38 minutes of face-to-face and non-face-to-face time with patient and husband.  This  included previsit chart review, lab review, study review, order entry, electronic health record documentation, patient education regarding prior stroke and seizures, secondary stroke prevention measures and aggressive stroke risk factor, residual deficits, essential tremors and ongoing use of medication, depression with adjustment disorder and answered all other questions to patient satisfaction  Frann Rider, AGNP-BC  [facility] [phone]  [place], [place] [address]  Phone [phone] Fax [phone] Note: This document was prepared with digital dictation and possible smart phrase technology. Any transcriptional errors that result from this process are unintentional.

## 2021-10-26 ENCOUNTER — Other Ambulatory Visit: Payer: Self-pay | Admitting: Endocrinology

## 2021-10-26 DIAGNOSIS — E1149 Type 2 diabetes mellitus with other diabetic neurological complication: Secondary | ICD-10-CM

## 2021-11-05 ENCOUNTER — Ambulatory Visit: Payer: Medicare PPO | Admitting: Endocrinology

## 2021-11-05 ENCOUNTER — Other Ambulatory Visit: Payer: Self-pay

## 2021-11-05 VITALS — BP 126/64 | HR 97 | Ht 63.0 in | Wt 175.0 lb

## 2021-11-05 DIAGNOSIS — E1149 Type 2 diabetes mellitus with other diabetic neurological complication: Secondary | ICD-10-CM

## 2021-11-05 DIAGNOSIS — Z794 Long term (current) use of insulin: Secondary | ICD-10-CM | POA: Diagnosis not present

## 2021-11-05 MED ORDER — TRULICITY 3 MG/0.5ML ~~LOC~~ SOAJ: 3.0000 mg | SUBCUTANEOUS | 3 refills | Status: DC

## 2021-11-05 NOTE — Progress Notes (Signed)
Subjective:    Patient ID: Brittany [name], female    DOB: Feb 03, 1961, 60 y.o.   MRN: [id number]  HPI Pt returns for f/u of diabetes mellitus: DM type: Insulin-requiring type 2 Dx'ed: 1740 Complications: CVA Therapy: insulin since 8144, and Trulicity.   GDM: 1987 DKA: never Severe hypoglycemia: never Pancreatitis: never Pancreatic imaging: normal on 2019 CT.   [place]: husband gives pt her insulin (due to CVA--he says he knows how to use syringe and vial).   Other: she gets multiple daily injections.   Interval history: I reviewed continuous glucose monitor data.  Glucose varies from 140-300.  It is in general highest at [place], and lowest at [facility].  However, there is little trend throughout the day.Pt says she seldom misses the insulin.  No recent steroids.  Past Medical History:  Diagnosis Date   Asthma    Cataract    Phreesia 10/03/2020   Chronic lower back pain    Depression    Depression    Phreesia 10/03/2020   Diabetes mellitus without complication ([place])    Phreesia 10/03/2020   Family history of adverse reaction to anesthesia    Mother has trouble coming out of anesthesia; patient reports she has no problems   Fibromyalgia    High cholesterol    Hypertension    Phreesia 10/03/2020   Presence of permanent cardiac pacemaker    Seizures ([place]) 11/2017   "day after she came home from hospital after having stroke" (01/01/2018)   Stroke ([place]) 12/06/2017   "speech issues since" (01/01/2018)   Type II diabetes mellitus [facility])     Past Surgical History:  Procedure Laterality Date   CHOLECYSTECTOMY     EYE SURGERY N/A    Phreesia 10/03/2020   GAS/FLUID EXCHANGE Right 12/12/2019   Procedure: Gas/Fluid Exchange;  Surgeon: Jalene Mullet, MD;  Location: [place];  Service: Ophthalmology;  Laterality: Right;   INSERT / REPLACE / REMOVE PACEMAKER  01/01/2018   IR CT HEAD LTD  12/06/2017   IR PERCUTANEOUS ART THROMBECTOMY/INFUSION INTRACRANIAL INC DIAG ANGIO  12/06/2017   LOOP RECORDER  INSERTION N/A 12/12/2017   Procedure: LOOP RECORDER INSERTION;  Surgeon: Constance Haw, MD;  Location: [place] CV LAB;  Service: Cardiovascular;  Laterality: N/A;   LOOP RECORDER REMOVAL  01/01/2018   LOOP RECORDER REMOVAL N/A 01/01/2018   Procedure: LOOP RECORDER REMOVAL;  Surgeon: Constance Haw, MD;  Location: [place] CV LAB;  Service: Cardiovascular;  Laterality: N/A;   NECK SURGERY     PACEMAKER IMPLANT N/A 01/01/2018   Procedure: PACEMAKER IMPLANT;  Surgeon: Constance Haw, MD;  Location: [place] CV LAB;  Service: Cardiovascular;  Laterality: N/A;   PHOTOCOAGULATION Right 12/12/2019   Procedure: Photocoagulation;  Surgeon: Jalene Mullet, MD;  Location: [place];  Service: Ophthalmology;  Laterality: Right;   RADIOLOGY WITH ANESTHESIA N/A 12/06/2017   Procedure: CODE STROKE;  Surgeon: Luanne Bras, MD;  Location: [place];  Service: Radiology;  Laterality: N/A;   SCLERAL BUCKLE WITH POSSIBLE 25 GAUGE PARS PLANA VITRECTOMY Right 12/12/2019   Procedure: SCLERAL BUCKLE WITH  PARS PLANA VITRECTOMY RETINAL DETACHMENT REPAIR;  Surgeon: Jalene Mullet, MD;  Location: [place];  Service: Ophthalmology;  Laterality: Right;   TEE WITHOUT CARDIOVERSION N/A 12/10/2017   Procedure: TRANSESOPHAGEAL ECHOCARDIOGRAM (TEE);  Surgeon: Jerline Pain, MD;  Location: [facility] ENDOSCOPY;  Service: Cardiovascular;  Laterality: N/A;   THROMBECTOMY FEMORAL ARTERY Right 12/07/2017   Procedure: THROMBECTOMY RIGHT FEMORAL ARTERY;  Surgeon: Waynetta Sandy,  MD;  Location: [facility] OR;  Service: Vascular;  Laterality: Right;   TONSILLECTOMY      Social History   Socioeconomic History   Marital status: Married    Spouse name: [name]   Number of children: 2   Years of education: 14   Highest education level: Not on file  Occupational History   Not on file  Tobacco Use   Smoking status: Former    Packs/day: 1.00    Years: 38.00    Pack years: 38.00    Types: Cigarettes   Smokeless tobacco:  Never   Tobacco comments:    Pt used chantix July till January, quit first of the year   Vaping Use   Vaping Use: Never used  Substance and Sexual Activity   Alcohol use: Yes    Comment: 01/01/2018 "nothing since stroke 11/2017"   Drug use: No   Sexual activity: Not Currently  Other Topics Concern   Not on file  Social History Narrative   Patient is married ([name]) and lives at home with her family   Patient has two children.   Patient works at [employer].   Patient has a college education.   Patient is right-handed.   Patient drinks 3 cups of coffee M-F.      Social Determinants of Health   Financial Resource Strain: Not on file  Food Insecurity: Not on file  Transportation Needs: Not on file  Physical Activity: Not on file  Stress: Not on file  Social Connections: Not on file  Intimate Partner Violence: Not on file    Current Outpatient Medications on File Prior to Visit  Medication Sig Dispense Refill   ACCU-CHEK GUIDE test strip USE AS DIRECTED FOUR TIMES DAILY 100 strip 5   acetaminophen (TYLENOL) 500 MG tablet Take 1,000 mg by mouth every 6 (six) hours as needed for fever (pain).     B Complex-Biotin-FA (MULTI-B COMPLEX) CAPS Take one a day ,B complex  multivitamin over the counter or use prenatal vitamin. 90 capsule 0   BD PEN NEEDLE NANO 2ND GEN 32G X 4 MM MISC USE FOUR TIMES DAILY 400 each 3   Blood Glucose Monitoring Suppl (ACCU-CHEK GUIDE ME) w/Device KIT 1 each by Does not apply route 4 (four) times daily. E11.9 1 kit 0   clopidogrel (PLAVIX) 75 MG tablet TAKE 1 TABLET(75 MG) BY MOUTH DAILY 90 tablet 3   Continuous Blood Gluc Sensor (DEXCOM G6 SENSOR) MISC 1 Device by Does not apply route See admin instructions. Change every 10 days 9 each 3   insulin aspart (NOVOLOG FLEXPEN) 100 UNIT/ML FlexPen 3 times a day (just before each meal) 5-7-7 units. 15 mL 11   irbesartan (AVAPRO) 150 MG tablet TAKE 1 TABLET(150 MG) BY MOUTH DAILY 90 tablet 3   Lancets  (ONETOUCH DELICA PLUS [id number]) MISC USE FOUR TIMES DAILY 400 each 3   LEVEMIR FLEXTOUCH 100 UNIT/ML FlexTouch Pen INJECT 18 UNITS UNDER THE SKIN AT BEDTIME 45 mL 1   levETIRAcetam (KEPPRA) 500 MG tablet Take 1 tablet (500 mg total) by mouth 2 (two) times daily. 180 tablet 3   LORazepam (ATIVAN) 0.5 MG tablet Take 0.5-1 tablets (0.25-0.5 mg total) by mouth 2 (two) times daily as needed for anxiety (anxiety and tremors (social situations)). 45 tablet 0   primidone (MYSOLINE) 250 MG tablet Take 1 tablet (250 mg total) by mouth daily. TAKE 1 TABLET(250 MG) BY MOUTH DAILY 90 tablet 3   rosuvastatin (CRESTOR) 20 MG tablet  Take 1 tablet (20 mg total) by mouth daily. 90 tablet 3   sertraline (ZOLOFT) 100 MG tablet TAKE 2 TABLETS(200 MG) BY MOUTH AT BEDTIME 180 tablet 1   No current facility-administered medications on file prior to visit.    No Known Allergies  Family History  Problem Relation Age of Onset   Cancer Mother    Diabetes Mother    Heart disease Mother    Parkinsonism Mother    Diabetes Father    Heart disease Father    Dementia Father    Cancer Maternal Grandmother 27       lung   COPD Maternal Aunt     BP 126/64    Pulse 97    Ht 5' 3"  (1.6 m)    Wt 175 lb (79.4 kg)    SpO2 96%    BMI 31.00 kg/m    Review of Systems She denies hypoglycemia/HB/N/V/bloating.      Objective:   Physical Exam       Assessment & Plan:  Insulin-requiring type 2 DM: uncontrolled.     Patient Instructions  check your blood sugar 4 times a day: before the 3 meals, and at bedtime.  also check if you have symptoms of your blood sugar being too high or too low.  please keep a record of the readings and bring it to your next appointment here (or you can bring the meter itself).  You can write it on any piece of paper.  please call us sooner if your blood sugar goes below 70, or if you have a lot of readings over 200.   I have sent a prescription to your pharmacy, to double the Trulicity again.    Please come back for a follow-up appointment in 1 month.

## 2021-11-05 NOTE — Patient Instructions (Addendum)
check your blood sugar 4 times a day: before the 3 meals, and at bedtime.  also check if you have symptoms of your blood sugar being too high or too low.  please keep a record of the readings and bring it to your next appointment here (or you can bring the meter itself).  You can write it on any piece of paper.  please call us sooner if your blood sugar goes below 70, or if you have a lot of readings over 200.   I have sent a prescription to your pharmacy, to double the Trulicity again.   Please come back for a follow-up appointment in 1 month.

## 2021-11-08 ENCOUNTER — Encounter: Payer: Self-pay | Admitting: Endocrinology

## 2021-12-04 ENCOUNTER — Other Ambulatory Visit: Payer: Self-pay

## 2021-12-04 ENCOUNTER — Ambulatory Visit: Payer: Medicare PPO | Admitting: Endocrinology

## 2021-12-04 VITALS — BP 142/78 | HR 79 | Ht 63.0 in | Wt 174.8 lb

## 2021-12-04 DIAGNOSIS — E1149 Type 2 diabetes mellitus with other diabetic neurological complication: Secondary | ICD-10-CM

## 2021-12-04 DIAGNOSIS — Z794 Long term (current) use of insulin: Secondary | ICD-10-CM

## 2021-12-04 LAB — POCT GLYCOSYLATED HEMOGLOBIN (HGB A1C): Hemoglobin A1C: 7.9 % — AB (ref 4.0–5.6)

## 2021-12-04 MED ORDER — NOVOLOG FLEXPEN 100 UNIT/ML ~~LOC~~ SOPN: PEN_INJECTOR | SUBCUTANEOUS | 11 refills | Status: DC

## 2021-12-04 MED ORDER — TRULICITY 4.5 MG/0.5ML ~~LOC~~ SOAJ: 4.5000 mg | SUBCUTANEOUS | 3 refills | Status: DC

## 2021-12-04 NOTE — Progress Notes (Signed)
? ?Subjective:  ? ? Patient ID: Brittany [name], female    DOB: 1961/03/24, 61 y.o.   MRN: [id number] ? ?HPI ?Pt returns for f/u of diabetes mellitus: ?DM type: Insulin-requiring type 2 ?Dx'ed: 1999 ?Complications: CVA ?Therapy: insulin since 5176, and Trulicity.   ?GDM: 1987 ?DKA: never ?Severe hypoglycemia: never ?Pancreatitis: never ?Pancreatic imaging: normal on 2019 CT.   ?[place]: husband gives pt her insulin (due to CVA--he says he knows how to use syringe and vial).   ?Other: she gets multiple daily injections; she eats meals at 12N and 6PM.  She takes the 5 units of Novolog with coffee in the morning  ?Interval history: I reviewed continuous glucose monitor data.  Glucose varies from 130-280.  It is in general highest at 1PM.  However, there is little trend throughout the day.  In particular, it is flat overnight.  Pt says she seldom misses the insulin.  No recent steroids.   ?Past Medical History:  ?Diagnosis Date  ? Asthma   ? Cataract   ? Phreesia 10/03/2020  ? Chronic lower back pain   ? Depression   ? Depression   ? Phreesia 10/03/2020  ? Diabetes mellitus without complication ([place])   ? Phreesia 10/03/2020  ? Family history of adverse reaction to anesthesia   ? Mother has trouble coming out of anesthesia; patient reports she has no problems  ? Fibromyalgia   ? High cholesterol   ? Hypertension   ? Phreesia 10/03/2020  ? Presence of permanent cardiac pacemaker   ? Seizures ([place]) 11/2017  ? "day after she came home from hospital after having stroke" (01/01/2018)  ? Stroke [facility]) 12/06/2017  ? "speech issues since" (01/01/2018)  ? Type II diabetes mellitus ([place])   ? ? ?Past Surgical History:  ?Procedure Laterality Date  ? CHOLECYSTECTOMY    ? EYE SURGERY N/A   ? Phreesia 10/03/2020  ? GAS/FLUID EXCHANGE Right 12/12/2019  ? Procedure: Gas/Fluid Exchange;  Surgeon: Jalene Mullet, MD;  Location: [place];  Service: Ophthalmology;  Laterality: Right;  ? INSERT / REPLACE / REMOVE PACEMAKER  01/01/2018  ? IR CT HEAD LTD   12/06/2017  ? IR PERCUTANEOUS ART THROMBECTOMY/INFUSION INTRACRANIAL INC DIAG ANGIO  12/06/2017  ? LOOP RECORDER INSERTION N/A 12/12/2017  ? Procedure: LOOP RECORDER INSERTION;  Surgeon: Constance Haw, MD;  Location: [place] CV LAB;  Service: Cardiovascular;  Laterality: N/A;  ? LOOP RECORDER REMOVAL  01/01/2018  ? LOOP RECORDER REMOVAL N/A 01/01/2018  ? Procedure: LOOP RECORDER REMOVAL;  Surgeon: Constance Haw, MD;  Location: [place] CV LAB;  Service: Cardiovascular;  Laterality: N/A;  ? NECK SURGERY    ? PACEMAKER IMPLANT N/A 01/01/2018  ? Procedure: PACEMAKER IMPLANT;  Surgeon: Constance Haw, MD;  Location: [place] CV LAB;  Service: Cardiovascular;  Laterality: N/A;  ? PHOTOCOAGULATION Right 12/12/2019  ? Procedure: Photocoagulation;  Surgeon: Jalene Mullet, MD;  Location: [place];  Service: Ophthalmology;  Laterality: Right;  ? RADIOLOGY WITH ANESTHESIA N/A 12/06/2017  ? Procedure: CODE STROKE;  Surgeon: Luanne Bras, MD;  Location: [place];  Service: Radiology;  Laterality: N/A;  ? SCLERAL BUCKLE WITH POSSIBLE 25 GAUGE PARS PLANA VITRECTOMY Right 12/12/2019  ? Procedure: SCLERAL BUCKLE WITH  PARS PLANA VITRECTOMY RETINAL DETACHMENT REPAIR;  Surgeon: Jalene Mullet, MD;  Location: [place];  Service: Ophthalmology;  Laterality: Right;  ? TEE WITHOUT CARDIOVERSION N/A 12/10/2017  ? Procedure: TRANSESOPHAGEAL ECHOCARDIOGRAM (TEE);  Surgeon: Jerline Pain, MD;  Location[facility] ENDOSCOPY;  Service: Cardiovascular;  Laterality: N/A;  ? THROMBECTOMY FEMORAL ARTERY Right 12/07/2017  ? Procedure: THROMBECTOMY RIGHT FEMORAL ARTERY;  Surgeon: Waynetta Sandy, MD;  Location: [place];  Service: Vascular;  Laterality: Right;  ? TONSILLECTOMY    ? ? ?Social History  ? ?Socioeconomic History  ? Marital status: Married  ?  Spouse name: [name]  ? Number of children: 2  ? Years of education: 11  ? Highest education level: Not on file  ?Occupational History  ? Not on file  ?Tobacco Use  ? Smoking status:  Former  ?  Packs/day: 1.00  ?  Years: 38.00  ?  Pack years: 38.00  ?  Types: Cigarettes  ? Smokeless tobacco: Never  ? Tobacco comments:  ?  Pt used chantix July till January, quit first of the year   ?Vaping Use  ? Vaping Use: Never used  ?Substance and Sexual Activity  ? Alcohol use: Yes  ?  Comment: 01/01/2018 "nothing since stroke 11/2017"  ? Drug use: No  ? Sexual activity: Not Currently  ?Other Topics Concern  ? Not on file  ?Social History Narrative  ? Patient is married ([name]) and lives at home with her family  ? Patient has two children.  ? Patient works at [employer].  ? Patient has a college education.  ? Patient is right-handed.  ? Patient drinks 3 cups of coffee M-F.  ?   ? ?Social Determinants of Health  ? ?Financial Resource Strain: Not on file  ?Food Insecurity: Not on file  ?Transportation Needs: Not on file  ?Physical Activity: Not on file  ?Stress: Not on file  ?Social Connections: Not on file  ?Intimate Partner Violence: Not on file  ? ? ?Current Outpatient Medications on File Prior to Visit  ?Medication Sig Dispense Refill  ? ACCU-CHEK GUIDE test strip USE AS DIRECTED FOUR TIMES DAILY 100 strip 5  ? acetaminophen (TYLENOL) 500 MG tablet Take 1,000 mg by mouth every 6 (six) hours as needed for fever (pain).    ? B Complex-Biotin-FA (MULTI-B COMPLEX) CAPS Take one a day ,B complex  multivitamin over the counter or use prenatal vitamin. 90 capsule 0  ? BD PEN NEEDLE NANO 2ND GEN 32G X 4 MM MISC USE FOUR TIMES DAILY 400 each 3  ? Blood Glucose Monitoring Suppl (ACCU-CHEK GUIDE ME) w/Device KIT 1 each by Does not apply route 4 (four) times daily. E11.9 1 kit 0  ? clopidogrel (PLAVIX) 75 MG tablet TAKE 1 TABLET(75 MG) BY MOUTH DAILY 90 tablet 3  ? Continuous Blood Gluc Sensor (DEXCOM G6 SENSOR) MISC 1 Device by Does not apply route See admin instructions. Change every 10 days 9 each 3  ? irbesartan (AVAPRO) 150 MG tablet TAKE 1 TABLET(150 MG) BY MOUTH DAILY 90 tablet 3  ? Lancets (ONETOUCH  DELICA PLUS [id number]) MISC USE FOUR TIMES DAILY 400 each 3  ? LEVEMIR FLEXTOUCH 100 UNIT/ML FlexTouch Pen INJECT 18 UNITS UNDER THE SKIN AT BEDTIME 45 mL 1  ? levETIRAcetam (KEPPRA) 500 MG tablet Take 1 tablet (500 mg total) by mouth 2 (two) times daily. 180 tablet 3  ? LORazepam (ATIVAN) 0.5 MG tablet Take 0.5-1 tablets (0.25-0.5 mg total) by mouth 2 (two) times daily as needed for anxiety (anxiety and tremors (social situations)). 45 tablet 0  ? primidone (MYSOLINE) 250 MG tablet Take 1 tablet (250 mg total) by mouth daily. TAKE 1 TABLET(250 MG) BY MOUTH DAILY 90 tablet 3  ? rosuvastatin (CRESTOR) 20  MG tablet Take 1 tablet (20 mg total) by mouth daily. 90 tablet 3  ? sertraline (ZOLOFT) 100 MG tablet TAKE 2 TABLETS(200 MG) BY MOUTH AT BEDTIME 180 tablet 1  ? ?No current facility-administered medications on file prior to visit.  ? ? ?No Known Allergies ? ?Family History  ?Problem Relation Age of Onset  ? Cancer Mother   ? Diabetes Mother   ? Heart disease Mother   ? Parkinsonism Mother   ? Diabetes Father   ? Heart disease Father   ? Dementia Father   ? Cancer Maternal Grandmother 17  ?     lung  ? COPD Maternal Aunt   ? ? ?BP (!) 142/78   Pulse 79   Ht 5' 3"  (1.6 m)   Wt 174 lb 12.8 oz (79.3 kg)   SpO2 96%   BMI 30.96 kg/m?  ? ? ?Review of Systems ?Denies N/HB.  She denies hypoglycemia.   ?   ?Objective:  ? Physical Exam ?VITAL SIGNS:  See vs page ?GENERAL: no distress ? ? ?A1c=7.9%.  ?   ?Assessment & Plan:  ?Insulin-requiring type 2 DM: uncontrolled ? ?Patient Instructions  ?check your blood sugar 4 times a day: before the 3 meals, and at bedtime.  also check if you have symptoms of your blood sugar being too high or too low.  please keep a record of the readings and bring it to your next appointment here (or you can bring the meter itself).  You can write it on any piece of paper.  please call us sooner if your blood sugar goes below 70, or if you have a lot of readings over 200.   ?I have sent a  prescription to your pharmacy, to increase the Trulicity again.   ?Also, please increase the lunch Novolog to 8 units.   ?Please come back for a follow-up appointment in 3 months.   ? ? ?

## 2021-12-04 NOTE — Patient Instructions (Addendum)
check your blood sugar 4 times a day: before the 3 meals, and at bedtime.  also check if you have symptoms of your blood sugar being too high or too low.  please keep a record of the readings and bring it to your next appointment here (or you can bring the meter itself).  You can write it on any piece of paper.  please call us sooner if your blood sugar goes below 70, or if you have a lot of readings over 200.   ?I have sent a prescription to your pharmacy, to increase the Trulicity again.   ?Also, please increase the lunch Novolog to 8 units.   ?Please come back for a follow-up appointment in 3 months.   ?

## 2021-12-18 DIAGNOSIS — E119 Type 2 diabetes mellitus without complications: Secondary | ICD-10-CM | POA: Diagnosis not present

## 2021-12-18 DIAGNOSIS — Z794 Long term (current) use of insulin: Secondary | ICD-10-CM | POA: Diagnosis not present

## 2021-12-18 DIAGNOSIS — G40909 Epilepsy, unspecified, not intractable, without status epilepticus: Secondary | ICD-10-CM | POA: Diagnosis not present

## 2021-12-18 DIAGNOSIS — E669 Obesity, unspecified: Secondary | ICD-10-CM | POA: Diagnosis not present

## 2021-12-18 DIAGNOSIS — R32 Unspecified urinary incontinence: Secondary | ICD-10-CM | POA: Diagnosis not present

## 2021-12-18 DIAGNOSIS — E785 Hyperlipidemia, unspecified: Secondary | ICD-10-CM | POA: Diagnosis not present

## 2021-12-18 DIAGNOSIS — I10 Essential (primary) hypertension: Secondary | ICD-10-CM | POA: Diagnosis not present

## 2021-12-18 DIAGNOSIS — F325 Major depressive disorder, single episode, in full remission: Secondary | ICD-10-CM | POA: Diagnosis not present

## 2021-12-18 DIAGNOSIS — F419 Anxiety disorder, unspecified: Secondary | ICD-10-CM | POA: Diagnosis not present

## 2021-12-21 ENCOUNTER — Other Ambulatory Visit: Payer: Self-pay | Admitting: Endocrinology

## 2021-12-21 DIAGNOSIS — Z794 Long term (current) use of insulin: Secondary | ICD-10-CM

## 2022-01-01 ENCOUNTER — Ambulatory Visit (INDEPENDENT_AMBULATORY_CARE_PROVIDER_SITE_OTHER): Payer: Medicare PPO

## 2022-01-01 DIAGNOSIS — I442 Atrioventricular block, complete: Secondary | ICD-10-CM | POA: Diagnosis not present

## 2022-01-02 LAB — CUP PACEART REMOTE DEVICE CHECK
Battery Remaining Longevity: 135 mo
Battery Voltage: 3.02 V
Brady Statistic AP VP Percent: 0.02 %
Brady Statistic AP VS Percent: 0.03 %
Brady Statistic AS VP Percent: 14.37 %
Brady Statistic AS VS Percent: 85.58 %
Brady Statistic RA Percent Paced: 0.05 %
Brady Statistic RV Percent Paced: 14.4 %
Date Time Interrogation Session: 20230425004511
Implantable Lead Implant Date: 20190425
Implantable Lead Implant Date: 20190425
Implantable Lead Location: 753859
Implantable Lead Location: 753860
Implantable Lead Model: 5076
Implantable Lead Model: 5076
Implantable Pulse Generator Implant Date: 20190425
Lead Channel Impedance Value: 342 Ohm
Lead Channel Impedance Value: 380 Ohm
Lead Channel Impedance Value: 418 Ohm
Lead Channel Impedance Value: 437 Ohm
Lead Channel Pacing Threshold Amplitude: 0.5 V
Lead Channel Pacing Threshold Amplitude: 0.75 V
Lead Channel Pacing Threshold Pulse Width: 0.4 ms
Lead Channel Pacing Threshold Pulse Width: 0.4 ms
Lead Channel Sensing Intrinsic Amplitude: 12.25 mV
Lead Channel Sensing Intrinsic Amplitude: 12.25 mV
Lead Channel Sensing Intrinsic Amplitude: 2.25 mV
Lead Channel Sensing Intrinsic Amplitude: 2.25 mV
Lead Channel Setting Pacing Amplitude: 1.5 V
Lead Channel Setting Pacing Amplitude: 2.5 V
Lead Channel Setting Pacing Pulse Width: 0.4 ms
Lead Channel Setting Sensing Sensitivity: 2 mV

## 2022-01-07 DIAGNOSIS — E119 Type 2 diabetes mellitus without complications: Secondary | ICD-10-CM | POA: Diagnosis not present

## 2022-01-17 NOTE — Progress Notes (Signed)
Remote pacemaker transmission.   

## 2022-03-27 ENCOUNTER — Other Ambulatory Visit: Payer: Self-pay

## 2022-03-27 DIAGNOSIS — Z794 Long term (current) use of insulin: Secondary | ICD-10-CM

## 2022-03-27 MED ORDER — BD PEN NEEDLE NANO 2ND GEN 32G X 4 MM MISC: 3 refills | Status: DC

## 2022-03-27 MED ORDER — LEVEMIR FLEXTOUCH 100 UNIT/ML ~~LOC~~ SOPN: PEN_INJECTOR | SUBCUTANEOUS | 0 refills | Status: DC

## 2022-03-27 MED ORDER — DEXCOM G6 SENSOR MISC: 1.0000 | 0 refills | Status: DC

## 2022-04-01 NOTE — Progress Notes (Unsigned)
Name: Brittany [name]  Age/ Sex: 61 y.o., female   MRN/ DOB: [id number], 11/17/1960     PCP: Wendie Agreste, MD   Reason for Endocrinology Evaluation: Type 2 Diabetes Mellitus  Initial Endocrine Consultative Visit: 05/04/2018    PATIENT IDENTIFIER: Brittany [name] is a 61 y.o. female with a past medical history of T2DM, HTN, fibromyalgia and dyslipidemia. The patient has followed with Endocrinology clinic since 05/04/2018 for consultative assistance with management of her diabetes.  DIABETIC HISTORY:  Brittany [name] was diagnosed with DM 1999, and started insulin therapy in 2019. Her hemoglobin A1c has ranged from 6.6% in 2013, peaking at >14.0% in 2018.    Was last seen by Dr. Loanne Drilling in March 2023  SUBJECTIVE:   During the last visit (11/26/2021): Saw Dr. Loanne Drilling, A1c 7.9%  Today (04/02/2022): Brittany [name] is here for a follow up on diabetes management.  She checks her blood sugars multiple  times daily, through CGM. The patient has  had hypoglycemic episodes since the last clinic visit, which typically occur rarely    Denies nausea, vomiting or diarrhea  She has not been able to use the dexcom for the past week  Husband is a caregiver but he  is on dialysis   HOME DIABETES REGIMEN:  Trulicity 4.5 mg weekly Levemir 14 units daily  NovoLog 5/8/8 units with meals      Statin: yes ACE-I/ARB: Yes     CONTINUOUS GLUCOSE MONITORING RECORD INTERPRETATION: Unable to download          DIABETIC COMPLICATIONS: Microvascular complications:   Denies: CKD Last Eye Exam: Completed 10/02/2021  Macrovascular complications:  CVA Denies: CAD, PVD   HISTORY:  Past Medical History:  Past Medical History:  Diagnosis Date   Asthma    Cataract    Phreesia 10/03/2020   Chronic lower back pain    Depression    Depression    Phreesia 10/03/2020   Diabetes mellitus without complication ([place])    Phreesia 10/03/2020   Family history of adverse reaction to anesthesia     Mother has trouble coming out of anesthesia; patient reports she has no problems   Fibromyalgia    High cholesterol    Hypertension    Phreesia 10/03/2020   Presence of permanent cardiac pacemaker    Seizures ([place]) 11/2017   "day after she came home from hospital after having stroke" (01/01/2018)   Stroke ([place]) 12/06/2017   "speech issues since" (01/01/2018)   Type II diabetes mellitus ([place])    Past Surgical History:  Past Surgical History:  Procedure Laterality Date   CHOLECYSTECTOMY     EYE SURGERY N/A    Phreesia 10/03/2020   GAS/FLUID EXCHANGE Right 12/12/2019   Procedure: Gas/Fluid Exchange;  Surgeon: Jalene Mullet, MD;  Location: [place];  Service: Ophthalmology;  Laterality: Right;   INSERT / REPLACE / REMOVE PACEMAKER  01/01/2018   IR CT HEAD LTD  12/06/2017   IR PERCUTANEOUS ART THROMBECTOMY/INFUSION INTRACRANIAL INC DIAG ANGIO  12/06/2017   LOOP RECORDER INSERTION N/A 12/12/2017   Procedure: LOOP RECORDER INSERTION;  Surgeon: Constance Haw, MD;  Location: [place] [facility];  Service: Cardiovascular;  Laterality: N/A;   LOOP RECORDER REMOVAL  01/01/2018   LOOP RECORDER REMOVAL N/A 01/01/2018   Procedure: LOOP RECORDER REMOVAL;  Surgeon: Constance Haw, MD;  Location: [place] [facility];  Service: Cardiovascular;  Laterality: N/A;   NECK SURGERY     PACEMAKER IMPLANT N/A 01/01/2018   Procedure: PACEMAKER  IMPLANT;  Surgeon: Constance Haw, MD;  Location: [place] [facility];  Service: Cardiovascular;  Laterality: N/A;   PHOTOCOAGULATION Right 12/12/2019   Procedure: Photocoagulation;  Surgeon: Jalene Mullet, MD;  Location: [place];  Service: Ophthalmology;  Laterality: Right;   RADIOLOGY WITH ANESTHESIA N/A 12/06/2017   Procedure: CODE STROKE;  Surgeon: Luanne Bras, MD;  Location: [place] [facility];  Service: Radiology;  Laterality: N/A;   SCLERAL BUCKLE WITH POSSIBLE 25 GAUGE PARS PLANA VITRECTOMY Right 12/12/2019   Procedure: SCLERAL BUCKLE WITH  PARS PLANA VITRECTOMY  RETINAL DETACHMENT REPAIR;  Surgeon: Jalene Mullet, MD;  Location: [facility];  Service: Ophthalmology;  Laterality: Right;   TEE WITHOUT CARDIOVERSION N/A 12/10/2017   Procedure: TRANSESOPHAGEAL ECHOCARDIOGRAM (TEE);  Surgeon: Jerline Pain, MD;  [facility];  Service: Cardiovascular;  Laterality: N/A;   THROMBECTOMY FEMORAL ARTERY Right 12/07/2017   Procedure: THROMBECTOMY RIGHT FEMORAL ARTERY;  Surgeon: Waynetta Sandy, MD;  Location: [place];  Service: Vascular;  Laterality: Right;   TONSILLECTOMY     Social History:  reports that she has quit smoking. Her smoking use included cigarettes. She has a 38.00 pack-year smoking history. She has never used smokeless tobacco. She reports current alcohol use. She reports that she does not use drugs. Family History:  Family History  Problem Relation Age of Onset   Cancer Mother    Diabetes Mother    Heart disease Mother    Parkinsonism Mother    Diabetes Father    Heart disease Father    Dementia Father    Cancer Maternal Grandmother 55       lung   COPD Maternal Aunt      HOME MEDICATIONS: Allergies as of 04/02/2022   No Known Allergies      Medication List        Accurate as of April 02, 2022 10:40 AM. If you have any questions, ask your nurse or doctor.          STOP taking these medications    Levemir FlexTouch 100 UNIT/ML FlexPen Generic drug: insulin detemir Stopped by: Dorita Sciara, MD       TAKE these medications    Accu-Chek Guide Me w/Device Kit 1 each by Does not apply route 4 (four) times daily. E11.9   Accu-Chek Guide test strip Generic drug: glucose blood USE AS DIRECTED FOUR TIMES DAILY   acetaminophen 500 MG tablet Commonly known as: TYLENOL Take 1,000 mg by mouth every 6 (six) hours as needed for fever (pain).   BD Pen Needle Nano 2nd Gen 32G X 4 MM Misc Generic drug: Insulin Pen Needle USE FOUR TIMES DAILY AS DIRECTED   clopidogrel 75 MG tablet Commonly known as:  PLAVIX TAKE 1 TABLET(75 MG) BY MOUTH DAILY   Dexcom G7 Sensor Misc 1 Device by Does not apply route as directed. What changed:  when to take this additional instructions Changed by: Dorita Sciara, MD   irbesartan 150 MG tablet Commonly known as: AVAPRO TAKE 1 TABLET(150 MG) BY MOUTH DAILY   Lantus SoloStar 100 UNIT/ML Solostar Pen Generic drug: insulin glargine Inject 20 Units into the skin daily. Started by: Dorita Sciara, MD   levETIRAcetam 500 MG tablet Commonly known as: KEPPRA Take 1 tablet (500 mg total) by mouth 2 (two) times daily.   LORazepam 0.5 MG tablet Commonly known as: Ativan Take 0.5-1 tablets (0.25-0.5 mg total) by mouth 2 (two) times daily as needed for anxiety (anxiety and tremors (social situations)).  Multi-B Complex Caps Take one a day ,B complex  multivitamin over the counter or use prenatal vitamin.   NovoLOG FlexPen 100 UNIT/ML FlexPen Generic drug: insulin aspart Max daily 30 units per scale What changed: additional instructions Changed by: Dorita Sciara, MD   OneTouch Delica Plus [id number] Misc USE FOUR TIMES DAILY   primidone 250 MG tablet Commonly known as: MYSOLINE Take 1 tablet (250 mg total) by mouth daily. TAKE 1 TABLET(250 MG) BY MOUTH DAILY   rosuvastatin 20 MG tablet Commonly known as: CRESTOR Take 1 tablet (20 mg total) by mouth daily.   sertraline 100 MG tablet Commonly known as: ZOLOFT TAKE 2 TABLETS(200 MG) BY MOUTH AT BEDTIME   Trulicity 4.5 WU/9.8JX Sopn Generic drug: Dulaglutide Inject 4.5 mg as directed once a week.         OBJECTIVE:   Vital Signs: BP 136/74 (BP Location: Left Arm, Patient Position: Sitting, Cuff Size: Small)   Pulse 90   Ht 5' 3"  (1.6 m)   Wt 175 lb 12.8 oz (79.7 kg)   SpO2 96%   BMI 31.14 kg/m   Wt Readings from Last 3 Encounters:  04/02/22 175 lb 12.8 oz (79.7 kg)  12/04/21 174 lb 12.8 oz (79.3 kg)  11/05/21 175 lb (79.4 kg)     Exam: General: Pt appears  well and is in NAD  Lungs: Clear with good BS bilat with no rales, rhonchi, or wheezes  Heart: RRR   Abdomen: Normoactive bowel sounds, soft, nontender, without masses or organomegaly palpable  Extremities: No pretibial edema.   Neuro: MS is good with appropriate affect, pt is alert and Ox3    DM foot exam: 04/02/2022  The skin of the feet is intact without sores or ulcerations. The pedal pulses are 2+ on right and 2+ on left. The sensation is intact to a screening 5.07, 10 gram monofilament bilaterally        DATA REVIEWED:  Lab Results  Component Value Date   HGBA1C 9.7 (A) 04/02/2022   HGBA1C 7.9 (A) 12/04/2021   HGBA1C 9.0 (A) 09/18/2021   Lab Results  Component Value Date   MICROALBUR 1.6 07/13/2016   LDLCALC 55 10/08/2021   CREATININE 0.65 10/08/2021   Lab Results  Component Value Date   MICRALBCREAT 18.9 08/28/2018     Lab Results  Component Value Date   CHOL 129 10/08/2021   HDL 51.40 10/08/2021   LDLCALC 55 10/08/2021   LDLDIRECT 61 12/03/2013   TRIG 111.0 10/08/2021   CHOLHDL 3 10/08/2021         ASSESSMENT / PLAN / RECOMMENDATIONS:   1) Type 2 Diabetes Mellitus, Poorly controlled, With Macrovascular complications - Most recent A1c of 9.7 %. Goal A1c < 7.0 %.    -Poorly controlled diabetes due to suboptimal medical management and dietary indiscretions -I have emphasized the importance of low-carb diet and avoiding sweets as she has been eating cookies and cream Oreos -We have not been able to download her Dexcom today, she was not able to show me what app she goes to to check her glucose either.  Patient with essential tremors and has difficulty applying the Dexcom and son is having to help her -She was also unable to tell me where she has been getting her Dexcom from -I have explained to the patient that we will start over with her Dexcom training and prescription and will fax this to her DME supplier -I have also referred her to our RD for Dexcom  training -I am going to switch Levemir to Lantus and increase her dose -I am not going to change her prandial dose of insulin due to lack of glucose data but I will provide her with a correction scale -We briefly discussed SGLT2 inhibitors, we will consider this on the next visit  MEDICATIONS: Stop Levemir Start Lantus 20 units daily Continue NovoLog 5 units with breakfast, 8 units with lunch, and 8 units with supper Start correction scale: NovoLog (BG -130/40)  EDUCATION / INSTRUCTIONS: BG monitoring instructions: Patient is instructed to check her blood sugars 3 times a day, before each meal. Call [facility] if: BG persistently < 70  I reviewed the Rule of 15 for the treatment of hypoglycemia in detail with the patient. Literature supplied.Marland Kitchen   2) Diabetic complications:  Eye: Does not have known diabetic retinopathy.  Neuro/ Feet: Does not have known diabetic peripheral neuropathy .  Renal: Patient does not have known baseline CKD. She   is  on an ACEI/ARB at present.    F/U in 4 months   Signed electronically by: Mack Guise, MD  [facility]  [address] [place], [place] [address] Phone: [phone] FAX: [phone]   CC: Wendie Agreste, MD [address] [place] [address] Phone: [phone]  Fax: [phone]  Return to Endocrinology clinic as below: Future Appointments  Date Time Provider [place]  04/08/2022  1:20 PM Wendie Agreste, MD [facility]  07/02/2022  7:50 AM [facility] DEVICE REMOTES [facility]  08/16/2022 12:10 PM [facility] Olarte, Melanie Crazier, MD [facility] None  10/01/2022  7:50 AM [facility] DEVICE REMOTES [facility]  10/22/2022  2:45 PM Frann Rider, NP [facility] None  12/31/2022  7:50 AM [facility] DEVICE REMOTES [facility]  04/01/2023  7:50 AM [facility] DEVICE REMOTES [facility]

## 2022-04-02 ENCOUNTER — Ambulatory Visit (INDEPENDENT_AMBULATORY_CARE_PROVIDER_SITE_OTHER): Payer: Medicare PPO | Admitting: Internal Medicine

## 2022-04-02 ENCOUNTER — Ambulatory Visit (INDEPENDENT_AMBULATORY_CARE_PROVIDER_SITE_OTHER): Payer: Medicare PPO

## 2022-04-02 ENCOUNTER — Encounter: Payer: Self-pay | Admitting: Internal Medicine

## 2022-04-02 VITALS — BP 136/74 | HR 90 | Ht 63.0 in | Wt 175.8 lb

## 2022-04-02 DIAGNOSIS — E1149 Type 2 diabetes mellitus with other diabetic neurological complication: Secondary | ICD-10-CM | POA: Diagnosis not present

## 2022-04-02 DIAGNOSIS — I442 Atrioventricular block, complete: Secondary | ICD-10-CM

## 2022-04-02 DIAGNOSIS — E1159 Type 2 diabetes mellitus with other circulatory complications: Secondary | ICD-10-CM

## 2022-04-02 DIAGNOSIS — Z794 Long term (current) use of insulin: Secondary | ICD-10-CM

## 2022-04-02 DIAGNOSIS — E1165 Type 2 diabetes mellitus with hyperglycemia: Secondary | ICD-10-CM

## 2022-04-02 DIAGNOSIS — E119 Type 2 diabetes mellitus without complications: Secondary | ICD-10-CM | POA: Insufficient documentation

## 2022-04-02 LAB — CUP PACEART REMOTE DEVICE CHECK
Battery Remaining Longevity: 132 mo
Battery Voltage: 3.02 V
Brady Statistic AP VP Percent: 0.04 %
Brady Statistic AP VS Percent: 0.04 %
Brady Statistic AS VP Percent: 5.44 %
Brady Statistic AS VS Percent: 94.48 %
Brady Statistic RA Percent Paced: 0.08 %
Brady Statistic RV Percent Paced: 5.49 %
Date Time Interrogation Session: 20230725004830
Implantable Lead Implant Date: 20190425
Implantable Lead Implant Date: 20190425
Implantable Lead Location: 753859
Implantable Lead Location: 753860
Implantable Lead Model: 5076
Implantable Lead Model: 5076
Implantable Pulse Generator Implant Date: 20190425
Lead Channel Impedance Value: 380 Ohm
Lead Channel Impedance Value: 399 Ohm
Lead Channel Impedance Value: 437 Ohm
Lead Channel Impedance Value: 456 Ohm
Lead Channel Pacing Threshold Amplitude: 0.625 V
Lead Channel Pacing Threshold Amplitude: 0.75 V
Lead Channel Pacing Threshold Pulse Width: 0.4 ms
Lead Channel Pacing Threshold Pulse Width: 0.4 ms
Lead Channel Sensing Intrinsic Amplitude: 13.375 mV
Lead Channel Sensing Intrinsic Amplitude: 13.375 mV
Lead Channel Sensing Intrinsic Amplitude: 2.5 mV
Lead Channel Sensing Intrinsic Amplitude: 2.5 mV
Lead Channel Setting Pacing Amplitude: 1.5 V
Lead Channel Setting Pacing Amplitude: 2.5 V
Lead Channel Setting Pacing Pulse Width: 0.4 ms
Lead Channel Setting Sensing Sensitivity: 2 mV

## 2022-04-02 LAB — POCT GLYCOSYLATED HEMOGLOBIN (HGB A1C): Hemoglobin A1C: 9.7 % — AB (ref 4.0–5.6)

## 2022-04-02 MED ORDER — BD PEN NEEDLE NANO 2ND GEN 32G X 4 MM MISC: 3 refills | Status: DC

## 2022-04-02 MED ORDER — TRULICITY 4.5 MG/0.5ML ~~LOC~~ SOAJ
4.5000 mg | SUBCUTANEOUS | 3 refills | Status: DC
Start: 2022-04-02 — End: 2022-09-26

## 2022-04-02 MED ORDER — LANTUS SOLOSTAR 100 UNIT/ML ~~LOC~~ SOPN: 20.0000 [IU] | PEN_INJECTOR | Freq: Every day | SUBCUTANEOUS | 6 refills | Status: DC

## 2022-04-02 MED ORDER — DEXCOM G7 SENSOR MISC: 1.0000 | 3 refills | Status: DC

## 2022-04-02 MED ORDER — NOVOLOG FLEXPEN 100 UNIT/ML ~~LOC~~ SOPN: PEN_INJECTOR | SUBCUTANEOUS | 3 refills | Status: DC

## 2022-04-02 NOTE — Patient Instructions (Signed)
Switch Levemir to LANTUS take 20 units ONCE daily  Novolog 5 units with Breakfast, 8 units with Lunch and 8 units with Supper  Continue trulicity 4.5 mg weekly    Novlog correctional insulin: ADD extra units on insulin to your meal-time Novolog dose if your blood sugars are higher than 170. Use the scale below to help guide you:   Blood sugar before meal Number of units to inject  Less than 170 0 unit  171 -  210 1 units  211 -  250 2 units  251 -  290 3 units  291 -  330 4 units  331 -  370 5 units  371 -  410 6 units  411 -  450 7 units  451 -  490 8 units    HOW TO TREAT LOW BLOOD SUGARS (Blood sugar LESS THAN 70 MG/DL) Please follow the RULE OF 15 for the treatment of hypoglycemia treatment (when your (blood sugars are less than 70 mg/dL)   STEP 1: Take 15 grams of carbohydrates when your blood sugar is low, which includes:  3-4 GLUCOSE TABS  OR 3-4 OZ OF JUICE OR REGULAR SODA OR ONE TUBE OF GLUCOSE GEL    STEP 2: RECHECK blood sugar in 15 MINUTES STEP 3: If your blood sugar is still low at the 15 minute recheck --> then, go back to STEP 1 and treat AGAIN with another 15 grams of carbohydrates.

## 2022-04-06 DIAGNOSIS — E1165 Type 2 diabetes mellitus with hyperglycemia: Secondary | ICD-10-CM | POA: Diagnosis not present

## 2022-04-08 ENCOUNTER — Ambulatory Visit (INDEPENDENT_AMBULATORY_CARE_PROVIDER_SITE_OTHER): Payer: Medicare PPO | Admitting: Family Medicine

## 2022-04-08 VITALS — BP 130/72 | HR 85 | Temp 98.9°F | Resp 16 | Ht 63.0 in | Wt 174.2 lb

## 2022-04-08 DIAGNOSIS — I6930 Unspecified sequelae of cerebral infarction: Secondary | ICD-10-CM

## 2022-04-08 DIAGNOSIS — F331 Major depressive disorder, recurrent, moderate: Secondary | ICD-10-CM | POA: Diagnosis not present

## 2022-04-08 DIAGNOSIS — F17210 Nicotine dependence, cigarettes, uncomplicated: Secondary | ICD-10-CM | POA: Diagnosis not present

## 2022-04-08 DIAGNOSIS — Z1211 Encounter for screening for malignant neoplasm of colon: Secondary | ICD-10-CM

## 2022-04-08 DIAGNOSIS — I10 Essential (primary) hypertension: Secondary | ICD-10-CM

## 2022-04-08 DIAGNOSIS — E785 Hyperlipidemia, unspecified: Secondary | ICD-10-CM

## 2022-04-08 DIAGNOSIS — Z1231 Encounter for screening mammogram for malignant neoplasm of breast: Secondary | ICD-10-CM | POA: Diagnosis not present

## 2022-04-08 MED ORDER — SERTRALINE HCL 100 MG PO TABS: ORAL_TABLET | ORAL | 1 refills | Status: DC

## 2022-04-08 MED ORDER — ROSUVASTATIN CALCIUM 20 MG PO TABS: 20.0000 mg | ORAL_TABLET | Freq: Every day | ORAL | 3 refills | Status: DC

## 2022-04-08 NOTE — Progress Notes (Signed)
Subjective:  Patient ID: Brittany [name], female    DOB: 01/19/1961  Age: 61 y.o. 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  Treated with irbesartan 150 mg daily. BP Readings from Last 3 Encounters:  04/08/22 130/72  04/02/22 136/74  12/04/21 (!) 142/78   Lab Results  Component Value Date   CREATININE 0.65 10/08/2021      Neuro History of CVA, April 2019, left MCA infarct treated with IV tPA, mechanical thrombectomy, symptomatic seizure after discharge, residual stroke deficit of expressive aphasia and cognitive impairment.  Posterior if depression/anxiety improved.  On primidone for essential tremor,  She is on Plavix for CVA with residual deficit and Keppra for seizure prophylaxis.  Neuro appointment in February noted. Continued Keppra, primidone. No recent seizure.  Some difficulty walking long distances at times, with tremor as well - requests handicap placard.   Hyperlipidemia: Crestor 20 mg daily.  History of previous CVA, goal LDL less than 70.  Stable on January labs. Diabetes followed by endocrinology, last visit noted from July 25.  New endocrinologist Dr. Kelton Pillar.  Levemir was discontinued, started on Lantus 20 units daily with NovoLog 5 units breakfast, 8 units lunch, 8 units supper and correction scale with NovoLog.  Considering SGLT2 inhibitor next visit, 45-monthfollow-up. Numbers improving with changes, working on [employer]  Lab Results  Component Value Date   CHOL 129 10/08/2021   HDL 51.40 10/08/2021   LDLCALC 55 10/08/2021   LDLDIRECT 61 12/03/2013   TRIG 111.0 10/08/2021   CHOLHDL 3 10/08/2021   Lab Results  Component Value  Date   ALT 11 10/08/2021   AST 11 10/08/2021   ALKPHOS 122 (H) 10/08/2021   BILITOT 0.4 10/08/2021   Depression: Stable in January's visit, continued on sertraline 200 mg daily, with lorazepam only as needed.  Previous therapy but was not needing to see recently.  Also had not required lorazepam at her last visit recently. Still working well sertraline [id number].      04/08/2022    1:28 PM 10/08/2021    1:25 PM 10/06/2020    4:06 PM 08/14/2020   11:18 AM 11/22/2019   10:55 AM  Depression screen PHQ 2/9  Decreased Interest 0 0 0 0 0  Down, Depressed, Hopeless 0 0 0 0 0  PHQ - 2 Score 0 0 0 0 0  Altered sleeping 2 1     Tired, decreased energy 1 1     Change in appetite 1 1     Feeling bad or failure about yourself  0 0     Trouble concentrating 0 0     Moving slowly or fidgety/restless 0 1     Suicidal thoughts 0 0     PHQ-9 Score 4 4      Nicotine addiction Quit smoking earlier this year. Has remained tobacco free.   HM: Colonoscopy - mom with colon CA, possibly father. Last colonoscopy in 2002? Agrees to referral.  Agrees to mammogram referral.  Plans repeat visit for pap - 2016 last pap.  Covid booster - not done.   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 ([place]) s/p IV tPA and mechanical intervention 12/12/2017   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 cerebrovascular accident (CVA) ([place]) 12/12/2017   Acute right hemiparesis ([place]) 12/12/2017   History of completed stroke 12/12/2017   Leukocytosis 12/12/2017   Hypokalemia 12/12/2017   Hypotension    Middle cerebral artery embolism, left  12/06/2017   Poor compliance with CPAP treatment 03/04/2017   OSA and COPD overlap syndrome ([place]) 03/04/2017   Simple chronic bronchitis ([place]) 03/04/2017   MCI (mild cognitive impairment) 11/27/2016   Left sided lacunar infarction ([place]) 03/11/2014   Cerebrovascular small vessel disease 03/11/2014   Insomnia, idiopathic 12/20/2013   Snoring 12/06/2013   Essential hypertension, benign 10/26/2012   Hyperlipidemia LDL goal <70 10/26/2012   Polypharmacy 10/26/2012   DM (diabetes mellitus) ([place]) 10/13/2011   Tremor 10/13/2011   GERD (gastroesophageal reflux disease) 10/13/2011   Depression 10/13/2011   Past Medical History:  Diagnosis Date   Asthma    Cataract    Phreesia 10/03/2020   Chronic lower back pain    Depression    Depression    Phreesia 10/03/2020   Diabetes mellitus without complication ([place])    Phreesia 10/03/2020   Family history of adverse reaction to anesthesia    Mother has trouble coming out of anesthesia; patient reports she has no problems   Fibromyalgia    High cholesterol    Hypertension    Phreesia 10/03/2020   Presence of permanent cardiac pacemaker    Seizures ([place]) 11/2017   "day after she came home from hospital after having stroke" (01/01/2018)   Stroke ([place]) 12/06/2017   "speech issues since" (01/01/2018)   Type II diabetes mellitus [facility])    Past Surgical History:  Procedure Laterality Date   CHOLECYSTECTOMY     EYE SURGERY N/A    Phreesia 10/03/2020   GAS/FLUID EXCHANGE Right 12/12/2019   Procedure: Gas/Fluid Exchange;  Surgeon: Jalene Mullet, MD;  Location: [place];  Service: Ophthalmology;  Laterality: Right;   INSERT / REPLACE / REMOVE PACEMAKER  01/01/2018   IR CT HEAD LTD  12/06/2017   IR PERCUTANEOUS ART THROMBECTOMY/INFUSION INTRACRANIAL INC DIAG ANGIO  12/06/2017   LOOP RECORDER INSERTION N/A 12/12/2017   Procedure: LOOP RECORDER INSERTION;  Surgeon: Constance Haw, MD;  Location: [place] CV LAB;  Service: Cardiovascular;   Laterality: N/A;   LOOP RECORDER REMOVAL  01/01/2018   LOOP RECORDER REMOVAL N/A 01/01/2018   Procedure: LOOP RECORDER REMOVAL;  Surgeon: Constance Haw, MD;  Location: [place] [facility];  Service: Cardiovascular;  Laterality: N/A;   NECK SURGERY     PACEMAKER IMPLANT N/A 01/01/2018   Procedure: PACEMAKER IMPLANT;  Surgeon: Constance Haw, MD;  Location: [place] [facility];  Service: Cardiovascular;  Laterality: N/A;   PHOTOCOAGULATION Right 12/12/2019   Procedure: Photocoagulation;  Surgeon: Jalene Mullet, MD;  Location: [place];  Service: Ophthalmology;  Laterality: Right;   RADIOLOGY WITH ANESTHESIA N/A 12/06/2017   Procedure: CODE STROKE;  Surgeon: Luanne Bras, MD;  Location: [place];  Service: Radiology;  Laterality: N/A;   SCLERAL BUCKLE WITH POSSIBLE 25 GAUGE PARS PLANA VITRECTOMY Right 12/12/2019   Procedure: SCLERAL BUCKLE  WITH  PARS PLANA VITRECTOMY RETINAL DETACHMENT REPAIR;  Surgeon: Jalene Mullet, MD;  Location: [place];  Service: Ophthalmology;  Laterality: Right;   TEE WITHOUT CARDIOVERSION N/A 12/10/2017   Procedure: TRANSESOPHAGEAL ECHOCARDIOGRAM (TEE);  Surgeon: Jerline Pain, MD;  Location: [facility] END[facility];  Service: Cardiovascular;  Laterality: N/A;   THROMBECTOMY FEMORAL ARTERY Right 12/07/2017   Procedure: THROMBECTOMY RIGHT FEMORAL ARTERY;  Surgeon: Waynetta Sandy, MD;  Location: [facility] OR;  Service: Vascular;  Laterality: Right;   TONSILLECTOMY     No Known Allergies Prior to Admission medications   Medication Sig Start Date End Date Taking? Authorizing Provider  ACCU-CHEK GUIDE test strip USE AS DIRECTED FOUR TIMES DAILY 09/18/21  Yes Renato Shin, MD  acetaminophen (TYLENOL) 500 MG tablet Take 1,000 mg by mouth every 6 (six) hours as needed for fever (pain).   Yes [provider]  B Complex-Biotin-FA (MULTI-B COMPLEX) CAPS Take one a day ,B complex  multivitamin over the counter or use prenatal vitamin. 08/28/16  Yes Dohmeier, Asencion Partridge, MD  Blood  Glucose Monitoring Suppl (ACCU-CHEK GUIDE ME) w/Device KIT 1 each by Does not apply route 4 (four) times daily. E11.9 05/22/20  Yes Renato Shin, MD  clopidogrel (PLAVIX) 75 MG tablet TAKE 1 TABLET(75 MG) BY MOUTH DAILY 10/11/21  Yes Wendie Agreste, MD  Continuous Blood Gluc Sensor (DEXCOM G7 SENSOR) MISC 1 Device by Does not apply route as directed. 04/02/22  Yes Shamleffer, Melanie Crazier, MD  Dulaglutide (TRULICITY) 4.5 IH/4.7QQ SOPN Inject 4.5 mg as directed once a week. 04/02/22  Yes Shamleffer, Melanie Crazier, MD  insulin aspart (NOVOLOG FLEXPEN) 100 UNIT/ML FlexPen Max daily 30 units per scale 04/02/22  Yes Shamleffer, Melanie Crazier, MD  insulin glargine (LANTUS SOLOSTAR) 100 UNIT/ML Solostar Pen Inject 20 Units into the skin daily. 04/02/22  Yes Shamleffer, Melanie Crazier, MD  Insulin Pen Needle (BD PEN NEEDLE NANO 2ND GEN) 32G X 4 MM MISC USE FOUR TIMES DAILY AS DIRECTED 04/02/22  Yes Shamleffer, Melanie Crazier, MD  irbesartan (AVAPRO) 150 MG tablet TAKE 1 TABLET(150 MG) BY MOUTH DAILY 10/11/21  Yes Wendie Agreste, MD  Lancets [facility] DELICA PLUS [id number]) [place] 07/10/21  Yes Renato Shin, MD  levETIRAcetam (KEPPRA) 500 MG tablet Take 1 tablet (500 mg total) by mouth 2 (two) times daily. 10/11/21  Yes McCue, Janett Billow, NP  LORazepam (ATIVAN) 0.5 MG tablet Take 0.5-1 tablets (0.25-0.5 mg total) by mouth 2 (two) times daily as needed for anxiety (anxiety and tremors (social situations)). 10/16/20  Yes McCue, Janett Billow, NP  primidone (MYSOLINE) 250 MG tablet Take 1 tablet (250 mg total) by mouth daily. TAKE 1 TABLET(250 MG) BY MOUTH DAILY 10/22/21  Yes McCue, Janett Billow, NP  rosuvastatin (CRESTOR) 20 MG tablet Take 1 tablet (20 mg total) by mouth daily. 05/22/21  Yes Wendie Agreste, MD  sertraline (ZOLOFT) 100 MG tablet TAKE 2 TABLETS(200 MG) BY MOUTH AT BEDTIME 10/08/21  Yes Wendie Agreste, MD   Social History   Socioeconomic History   Marital status: Married    Spouse  name: [name]   Number of children: 2   Years of education: 14   Highest education level: Not on file  Occupational History   Not on file  Tobacco Use   Smoking status: Former    Packs/day: 1.00    Years: 38.00    Total pack years: 38.00    Types: Cigarettes   Smokeless tobacco: Never   Tobacco comments:    Pt  used chantix July till January, quit first of the year   Vaping Use   Vaping Use: Never used  Substance and Sexual Activity   Alcohol use: Yes    Comment: 01/01/2018 "nothing since stroke 11/2017"   Drug use: No   Sexual activity: Not Currently  Other Topics Concern   Not on file  Social History Narrative   Patient is married ([name]) and lives at home with her family   Patient has two children.   Patient works at [employer].   Patient has a college education.   Patient is right-handed.   Patient drinks 3 cups of coffee M-F.      Social Determinants of Health   Financial Resource Strain: Not on file  Food Insecurity: Not on file  Transportation Needs: Not on file  Physical Activity: Not on file  Stress: Not on file  Social Connections: Not on file  Intimate Partner Violence: Not on file    Review of Systems Per HPI .  Objective:   Vitals:   04/08/22 1325  BP: 130/72  Pulse: 85  Resp: 16  Temp: 98.9 F (37.2 C)  TempSrc: Oral  SpO2: 95%  Weight: 174 lb 3.2 oz (79 kg)  Height: _0  (1.6 m)     Physical Exam Vitals reviewed.  Constitutional:      Appearance: Normal appearance. She is well-developed.  HENT:     Head: Normocephalic and atraumatic.  Eyes:     Conjunctiva/sclera: Conjunctivae normal.     Pupils: Pupils are equal, round, and reactive to light.  Neck:     Vascular: No carotid bruit.  Cardiovascular:     Rate and Rhythm: Normal rate and regular rhythm.     Heart sounds: Normal heart sounds.  Pulmonary:     Effort: Pulmonary effort is normal.     Breath sounds: Normal breath sounds.  Abdominal:     Palpations: Abdomen is  soft. There is no pulsatile mass.     Tenderness: There is no abdominal tenderness.  Musculoskeletal:     Right lower leg: No edema.     Left lower leg: No edema.  Skin:    General: Skin is warm and dry.  Neurological:     Mental Status: She is alert and oriented to person, place, and time.     Comments: Resting tremor.   Psychiatric:        Mood and Affect: Mood normal.        Behavior: Behavior normal.     Assessment & Plan:  Brittany [name] is a 61 y.o. female . Moderate episode of recurrent major depressive disorder ([place]) - Plan: sertraline (ZOLOFT) 100 MG tablet -Stable on current dosing of sertraline 200 mg daily, continue same.  Denies recent need of benzodiazepine.   Hyperlipidemia LDL goal <70 - Plan: rosuvastatin (CRESTOR) 20 MG tablet, Comprehensive metabolic panel, Lipid panel  -Goal less than 70 with prior CVA.  Tolerating Crestor, updated labs ordered.  Encounter for screening mammogram for malignant neoplasm of breast - Plan: MM Digital Screening ordered  Screening for colon cancer - Plan: Ambulatory referral to Gastroenterology for colon cancer screening given family history.  Overdue.  Essential hypertension, benign  -Stable on current regimen.  Continue same.  Continue follow-up with cardiology.  Monitor BP control with history of CVA.  History of CVA with residual deficit  -Denies any bleeding or side effects with current medications, recent neurology note reviewed as above including treatment of tremor which  she reports has been stable.  Denies home needs at this time.  Handicap placard completed.  Cigarette nicotine dependence without complication  -Prior smoker, commended on continued cessation.  Meds ordered this encounter  Medications   sertraline (ZOLOFT) 100 MG tablet    Sig: TAKE 2 TABLETS(200 MG) BY MOUTH AT BEDTIME    Dispense:  180 tablet    Refill:  1   rosuvastatin (CRESTOR) 20 MG tablet    Sig: Take 1 tablet (20 mg total) by mouth daily.     Dispense:  90 tablet    Refill:  3   Patient Instructions  I do recommend covid bivalent booster that is at the pharmacy.  I will refer you for mammogram and colonoscopy, we will schedule follow up for pap testing and lab review.  No med changes for now.  Thanks for coming in today.     Signed,   Merri Ray, MD [facility] 04/08/22 2:08 PM

## 2022-04-08 NOTE — Patient Instructions (Addendum)
I do recommend covid bivalent booster that is at the pharmacy.  I will refer you for mammogram and colonoscopy, we will schedule follow up for pap testing and lab review.  No med changes for now.  Thanks for coming in today.

## 2022-04-22 ENCOUNTER — Telehealth: Payer: Self-pay | Admitting: Adult Health

## 2022-04-22 NOTE — Telephone Encounter (Signed)
Pt is calling for a refill on levETIRAcetam (KEPPRA) 500 MG tablet. Pt is requesting prescription be sent to [id number]

## 2022-04-22 NOTE — Telephone Encounter (Signed)
Called patient, LVM informing her NP refilled levetiracetam in Feb x 1 year to [facility] she requested. I advised she call them for a refill.

## 2022-04-24 ENCOUNTER — Ambulatory Visit
Admission: RE | Admit: 2022-04-24 | Discharge: 2022-04-24 | Disposition: A | Discharge status: Home / Self Care | Payer: Medicare PPO | Source: Ambulatory Visit | Attending: Family Medicine | Admitting: Family Medicine

## 2022-04-24 DIAGNOSIS — Z1231 Encounter for screening mammogram for malignant neoplasm of breast: Secondary | ICD-10-CM

## 2022-04-24 NOTE — Telephone Encounter (Signed)
Called pharmacy, spoke with Mo who stated they have refills. Patient was using old Rx #. She will get ready for patient today.  Called patient and LVM to advise her.

## 2022-04-24 NOTE — Telephone Encounter (Signed)
Pt husband [name]) is calling and said they have called [facility] several times for the levETIRAcetam (KEPPRA) 500 MG tablet. And [facility] has stated they need a prescription and Pt said she talked to some here four days ago that was going to send one but it was not sent.

## 2022-04-30 NOTE — Progress Notes (Signed)
Remote pacemaker transmission.   

## 2022-05-06 DIAGNOSIS — E1165 Type 2 diabetes mellitus with hyperglycemia: Secondary | ICD-10-CM | POA: Diagnosis not present

## 2022-05-09 ENCOUNTER — Ambulatory Visit: Payer: Medicare PPO | Admitting: Family Medicine

## 2022-05-10 ENCOUNTER — Ambulatory Visit: Payer: Medicare PPO | Admitting: Family Medicine

## 2022-05-10 DIAGNOSIS — I10 Essential (primary) hypertension: Secondary | ICD-10-CM

## 2022-05-10 DIAGNOSIS — E876 Hypokalemia: Secondary | ICD-10-CM

## 2022-05-10 DIAGNOSIS — Z124 Encounter for screening for malignant neoplasm of cervix: Secondary | ICD-10-CM

## 2022-05-10 DIAGNOSIS — I6930 Unspecified sequelae of cerebral infarction: Secondary | ICD-10-CM

## 2022-05-10 DIAGNOSIS — E1165 Type 2 diabetes mellitus with hyperglycemia: Secondary | ICD-10-CM

## 2022-05-10 DIAGNOSIS — I959 Hypotension, unspecified: Secondary | ICD-10-CM

## 2022-05-20 ENCOUNTER — Ambulatory Visit: Payer: Medicare PPO | Admitting: Family Medicine

## 2022-05-26 ENCOUNTER — Other Ambulatory Visit: Payer: Self-pay | Admitting: Family Medicine

## 2022-05-26 DIAGNOSIS — F331 Major depressive disorder, recurrent, moderate: Secondary | ICD-10-CM

## 2022-06-05 DIAGNOSIS — E1165 Type 2 diabetes mellitus with hyperglycemia: Secondary | ICD-10-CM | POA: Diagnosis not present

## 2022-06-06 ENCOUNTER — Encounter: Payer: Self-pay | Admitting: Family Medicine

## 2022-06-06 ENCOUNTER — Ambulatory Visit (INDEPENDENT_AMBULATORY_CARE_PROVIDER_SITE_OTHER): Payer: Medicare PPO | Admitting: Family Medicine

## 2022-06-06 VITALS — BP 128/68 | HR 82 | Temp 98.3°F | Ht 63.0 in | Wt 172.2 lb

## 2022-06-06 DIAGNOSIS — I10 Essential (primary) hypertension: Secondary | ICD-10-CM

## 2022-06-06 DIAGNOSIS — F331 Major depressive disorder, recurrent, moderate: Secondary | ICD-10-CM | POA: Diagnosis not present

## 2022-06-06 DIAGNOSIS — Z124 Encounter for screening for malignant neoplasm of cervix: Secondary | ICD-10-CM | POA: Diagnosis not present

## 2022-06-06 DIAGNOSIS — E785 Hyperlipidemia, unspecified: Secondary | ICD-10-CM

## 2022-06-06 LAB — LIPID PANEL
Cholesterol: 108 mg/dL (ref 0–200)
HDL: 47.3 mg/dL (ref >39.00)
LDL Cholesterol: 44 mg/dL (ref 0–99)
NonHDL: 61.07
Total CHOL/HDL Ratio: 2
Triglycerides: 83 mg/dL (ref 0.0–149.0)
VLDL: 16.6 mg/dL (ref 0.0–40.0)

## 2022-06-06 LAB — COMPREHENSIVE METABOLIC PANEL
ALT: 10 U/L (ref 0–35)
AST: 11 U/L (ref 0–37)
Albumin: 4.5 g/dL (ref 3.5–5.2)
Alkaline Phosphatase: 94 U/L (ref 39–117)
BUN: 12 mg/dL (ref 6–23)
CO2: 29 mEq/L (ref 19–32)
Calcium: 9.7 mg/dL (ref 8.4–10.5)
Chloride: 105 mEq/L (ref 96–112)
Creatinine, Ser: 0.58 mg/dL (ref 0.40–1.20)
GFR: 96.54 mL/min (ref >60.00)
Glucose, Bld: 95 mg/dL (ref 70–99)
Potassium: 3.9 mEq/L (ref 3.5–5.1)
Sodium: 141 mEq/L (ref 135–145)
Total Bilirubin: 0.4 mg/dL (ref 0.2–1.2)
Total Protein: 7.7 g/dL (ref 6.0–8.3)

## 2022-06-06 NOTE — Progress Notes (Signed)
Subjective:  Patient ID: Brittany [name], female    DOB: [birth year]  Age: 61 y.o. MRN: [id number]  CC:  Chief Complaint  Patient presents with   Depression    Pt states she has a lot of family issues going on right now  PHQ9 - 3 GAD - 3   Hyperlipidemia   Hypertension    HPI Brittany [name] presents for  Lab review from last visit and pap testing.  Chronic meds/problems discussed at her July 31 visit. Did not have labs last visit.   Depression: Discussed in July, continued on sertraline 200 mg daily, lorazepam if needed but had not been requiring that recently.  Some family stressors.  Has met with therapist previously. Only needed 2-3 ativan since last visit. Doing ok. Declines therapy.     06/06/2022   11:12 AM 04/08/2022    1:28 PM 10/08/2021    1:25 PM 10/06/2020    4:06 PM 08/14/2020   11:18 AM  Depression screen PHQ 2/9  Decreased Interest 0 0 0 0 0  Down, Depressed, Hopeless 1 0 0 0 0  PHQ - 2 Score 1 0 0 0 0  Altered sleeping _0 Tired, decreased energy 0 1 1    Change in appetite 0 1 1    Feeling bad or failure about yourself  1 0 0    Trouble concentrating 0 0 0    Moving slowly or fidgety/restless 0 0 1    Suicidal thoughts 0 0 0    PHQ-9 Score _1 HM: Due for pap testing. Last pap with Dr. Brigitte Pulse - 07/20/15 - negative with neg high risk HPV.   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 ([place]) s/p IV tPA and mechanical intervention 12/12/2017   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 cerebrovascular accident (CVA) ([place]) 12/12/2017   Acute right hemiparesis ([place])  12/12/2017   History of completed stroke 12/12/2017   Leukocytosis 12/12/2017   Hypokalemia 12/12/2017   Hypotension    Middle cerebral artery embolism, left 12/06/2017   Poor compliance with CPAP treatment 03/04/2017   OSA and COPD overlap syndrome ([place]) 03/04/2017   Simple chronic bronchitis ([place]) 03/04/2017   MCI (mild cognitive impairment) 11/27/2016   Left sided lacunar infarction ([place]) 03/11/2014   Cerebrovascular small vessel disease 03/11/2014   Insomnia, idiopathic 12/20/2013   Snoring 12/06/2013   Essential hypertension, benign 10/26/2012   Hyperlipidemia LDL goal <70 10/26/2012   Polypharmacy 10/26/2012   DM (diabetes mellitus) ([place]) 10/13/2011   Tremor 10/13/2011   GERD (gastroesophageal reflux disease) 10/13/2011   Depression 10/13/2011   Past Medical History:  Diagnosis Date   Asthma    Cataract    Phreesia 10/03/2020   Chronic lower back pain    Depression    Depression    Phreesia 10/03/2020   Diabetes mellitus without complication ([place])    Phreesia 10/03/2020   Family history of adverse reaction to anesthesia    Mother has trouble coming out of anesthesia; patient reports she has no problems   Fibromyalgia    High  cholesterol    Hypertension    Phreesia 10/03/2020   Presence of permanent cardiac pacemaker    Seizures ([place]) 11/2017   "day after she came home from hospital after having stroke" (01/01/2018)   Stroke ([place]) 12/06/2017   "speech issues since" (01/01/2018)   Type II diabetes mellitus [facility])    Past Surgical History:  Procedure Laterality Date   CHOLECYSTECTOMY     EYE SURGERY N/A    Phreesia 10/03/2020   GAS/FLUID EXCHANGE Right 12/12/2019   Procedure: Gas/Fluid Exchange;  Surgeon: Jalene Mullet, MD;  Location: [place];  Service: Ophthalmology;  Laterality: Right;   INSERT / REPLACE / REMOVE PACEMAKER  01/01/2018   IR CT HEAD LTD  12/06/2017   IR PERCUTANEOUS ART THROMBECTOMY/INFUSION INTRACRANIAL INC DIAG ANGIO  12/06/2017   LOOP RECORDER  INSERTION N/A 12/12/2017   Procedure: LOOP RECORDER INSERTION;  Surgeon: Constance Haw, MD;  Location: [place] [facility];  Service: Cardiovascular;  Laterality: N/A;   LOOP RECORDER REMOVAL  01/01/2018   LOOP RECORDER REMOVAL N/A 01/01/2018   Procedure: LOOP RECORDER REMOVAL;  Surgeon: Constance Haw, MD;  Location: [place] [facility];  Service: Cardiovascular;  Laterality: N/A;   NECK SURGERY     PACEMAKER IMPLANT N/A 01/01/2018   Procedure: PACEMAKER IMPLANT;  Surgeon: Constance Haw, MD;  Location: [place] [facility];  Service: Cardiovascular;  Laterality: N/A;   PHOTOCOAGULATION Right 12/12/2019   Procedure: Photocoagulation;  Surgeon: Jalene Mullet, MD;  Location: [place];  Service: Ophthalmology;  Laterality: Right;   RADIOLOGY WITH ANESTHESIA N/A 12/06/2017   Procedure: CODE STROKE;  Surgeon: Luanne Bras, MD;  Location: [place];  Service: Radiology;  Laterality: N/A;   SCLERAL BUCKLE WITH POSSIBLE 25 GAUGE PARS PLANA VITRECTOMY Right 12/12/2019   Procedure: SCLERAL BUCKLE WITH  PARS PLANA VITRECTOMY RETINAL DETACHMENT REPAIR;  Surgeon: Jalene Mullet, MD;  Location: [place];  Service: Ophthalmology;  Laterality: Right;   TEE WITHOUT CARDIOVERSION N/A 12/10/2017   Procedure: TRANSESOPHAGEAL ECHOCARDIOGRAM (TEE);  Surgeon: Jerline Pain, MD;  Location: [facility] ENDOSCOPY;  Service: Cardiovascular;  Laterality: N/A;   THROMBECTOMY FEMORAL ARTERY Right 12/07/2017   Procedure: THROMBECTOMY RIGHT FEMORAL ARTERY;  Surgeon: Waynetta Sandy, MD;  Location: [facility] OR;  Service: Vascular;  Laterality: Right;   TONSILLECTOMY     No Known Allergies Prior to Admission medications   Medication Sig Start Date End Date Taking? Authorizing Provider  ACCU-CHEK GUIDE test strip USE AS DIRECTED FOUR TIMES DAILY 09/18/21  Yes Renato Shin, MD  acetaminophen (TYLENOL) 500 MG tablet Take 1,000 mg by mouth every 6 (six) hours as needed for fever (pain).   Yes [provider]  B  Complex-Biotin-FA (MULTI-B COMPLEX) CAPS Take one a day ,B complex  multivitamin over the counter or use prenatal vitamin. 08/28/16  Yes Dohmeier, Asencion Partridge, MD  Blood Glucose Monitoring Suppl (ACCU-CHEK GUIDE ME) w/Device KIT 1 each by Does not apply route 4 (four) times daily. E11.9 05/22/20  Yes Renato Shin, MD  clopidogrel (PLAVIX) 75 MG tablet TAKE 1 TABLET(75 MG) BY MOUTH DAILY 10/11/21  Yes Wendie Agreste, MD  Continuous Blood Gluc Sensor (DEXCOM G7 SENSOR) MISC 1 Device by Does not apply route as directed. 04/02/22  Yes Shamleffer, Melanie Crazier, MD  Dulaglutide (TRULICITY) 4.5 LP/3.7TK SOPN Inject 4.5 mg as directed once a week. 04/02/22  Yes Shamleffer, Melanie Crazier, MD  insulin aspart (NOVOLOG FLEXPEN) 100 UNIT/ML FlexPen Max daily 30 units per scale 04/02/22  Yes Shamleffer, Mammie Lorenzo  Amedeo Kinsman, MD  insulin glargine (LANTUS SOLOSTAR) 100 UNIT/ML Solostar Pen Inject 20 Units into the skin daily. 04/02/22  Yes Shamleffer, Melanie Crazier, MD  Insulin Pen Needle (BD PEN NEEDLE NANO 2ND GEN) 32G X 4 MM MISC USE FOUR TIMES DAILY AS DIRECTED 04/02/22  Yes Shamleffer, Melanie Crazier, MD  irbesartan (AVAPRO) 150 MG tablet TAKE 1 TABLET(150 MG) BY MOUTH DAILY 10/11/21  Yes Wendie Agreste, MD  Lancets [facility] DELICA PLUS [id number]) [place] 07/10/21  Yes Renato Shin, MD  primidone (MYSOLINE) 250 MG tablet Take 1 tablet (250 mg total) by mouth daily. TAKE 1 TABLET(250 MG) BY MOUTH DAILY 10/22/21  Yes McCue, Janett Billow, NP  rosuvastatin (CRESTOR) 20 MG tablet Take 1 tablet (20 mg total) by mouth daily. 04/08/22  Yes Wendie Agreste, MD  sertraline (ZOLOFT) 100 MG tablet TAKE 2 TABLETS(200 MG) BY MOUTH AT BEDTIME 05/27/22  Yes Wendie Agreste, MD  levETIRAcetam (KEPPRA) 500 MG tablet Take 1 tablet (500 mg total) by mouth 2 (two) times daily. Patient not taking: Reported on 06/06/2022 10/11/21   Frann Rider, NP  LORazepam (ATIVAN) 0.5 MG tablet Take 0.5-1 tablets (0.25-0.5 mg total) by  mouth 2 (two) times daily as needed for anxiety (anxiety and tremors (social situations)). Patient not taking: Reported on 06/06/2022 10/16/20   Frann Rider, NP   Social History   Socioeconomic History   Marital status: Married    Spouse name: [name]   Number of children: 2   Years of education: 14   Highest education level: Not on file  Occupational History   Not on file  Tobacco Use   Smoking status: Former    Packs/day: 1.00    Years: 38.00    Total pack years: 38.00    Types: Cigarettes   Smokeless tobacco: Never   Tobacco comments:    Pt used chantix July till January, quit first of the year   Vaping Use   Vaping Use: Never used  Substance and Sexual Activity   Alcohol use: Yes    Comment: 01/01/2018 "nothing since stroke 11/2017"   Drug use: No   Sexual activity: Not Currently  Other Topics Concern   Not on file  Social History Narrative   Patient is married ([name]) and lives at home with her family   Patient has two children.   Patient works at [employer].   Patient has a college education.   Patient is right-handed.   Patient drinks 3 cups of coffee M-F.      Social Determinants of Health   Financial Resource Strain: Not on file  Food Insecurity: Not on file  Transportation Needs: Not on file  Physical Activity: Not on file  Stress: Not on file  Social Connections: Not on file  Intimate Partner Violence: Not on file    Review of Systems Per HPI.   Objective:   Vitals:   06/06/22 1115  BP: 128/68  Pulse: 82  Temp: 98.3 F (36.8 C)  SpO2: 95%  Weight: 172 lb 3.2 oz (78.1 kg)  Height: 5' 3" (1.6 m)     Physical Exam Exam conducted with a chaperone present.  Constitutional:      General: She is not in acute distress.    Appearance: Normal appearance. She is well-developed.  HENT:     Head: Normocephalic and atraumatic.  Cardiovascular:     Rate and Rhythm: Normal rate.  Pulmonary:     Effort: Pulmonary effort is normal.  Genitourinary:    General: Normal vulva.     Exam position: Lithotomy position.     Labia:        Right: No rash, tenderness, lesion or injury.        Left: No rash, tenderness, lesion or injury.      Vagina: Normal. No vaginal discharge or bleeding.     Cervix: Cervical bleeding (Somewhat difficult exam posterior cervix, small amount of oozing of blood from surface of cervix after speculum exam.  No vaginal lesions identified.) present.     Comments: Chaperone present, Mallie Darting.  Neurological:     Mental Status: She is alert and oriented to person, place, and time.  Psychiatric:        Mood and Affect: Mood normal.        Assessment & Plan:  Brittany [name] is a 61 y.o. female . Moderate episode of recurrent major depressive disorder ([place])  -Family/situational stressors as above but they denied any need for additional resources or change in medications at this time.  Hyperlipidemia LDL goal <70  -Unfortunately labs were not obtained last visit, will check today.  No med changes for now.  Screening for cervical cancer - Plan: Pap IG and HPV (high risk) DNA detection  -Pap testing as above.  Somewhat difficult exam, discussed potential need for repeat testing if endocervical component not present.  Pad given for some bleeding noted with exam.  RTC precautions given.  Essential hypertension, benign  -Stable, labs from last visit obtained today.  No orders of the defined types were placed in this encounter.  Patient Instructions  I will let you know if any concerns on your labs or pap test. Thanks for coming in today.     Signed,   Merri Ray, MD [place] 06/06/22 12:40 PM

## 2022-06-06 NOTE — Patient Instructions (Addendum)
I will let you know if any concerns on your labs or pap test. Thanks for coming in today.

## 2022-06-08 LAB — PAP IG AND HPV HIGH-RISK: HPV DNA High Risk: NOT DETECTED

## 2022-06-24 DIAGNOSIS — Z0289 Encounter for other administrative examinations: Secondary | ICD-10-CM

## 2022-06-25 ENCOUNTER — Telehealth: Payer: Self-pay

## 2022-06-25 NOTE — Telephone Encounter (Signed)
Signed and placed in MR for pick up.

## 2022-06-25 NOTE — Telephone Encounter (Signed)
Forms completed, placed on NP desk to review and sign.

## 2022-06-25 NOTE — Telephone Encounter (Signed)
Completed and placed in out box.  Thank you. 

## 2022-06-26 ENCOUNTER — Telehealth: Payer: Self-pay | Admitting: *Deleted

## 2022-06-26 NOTE — Telephone Encounter (Signed)
Pt guardian form faxed on 06/26/2022

## 2022-07-02 ENCOUNTER — Ambulatory Visit (INDEPENDENT_AMBULATORY_CARE_PROVIDER_SITE_OTHER): Payer: Medicare PPO

## 2022-07-02 ENCOUNTER — Other Ambulatory Visit: Payer: Self-pay | Admitting: Family Medicine

## 2022-07-02 DIAGNOSIS — I442 Atrioventricular block, complete: Secondary | ICD-10-CM

## 2022-07-02 DIAGNOSIS — E785 Hyperlipidemia, unspecified: Secondary | ICD-10-CM

## 2022-07-04 LAB — CUP PACEART REMOTE DEVICE CHECK
Battery Remaining Longevity: 128 mo
Battery Voltage: 3.02 V
Brady Statistic AP VP Percent: 0.03 %
Brady Statistic AP VS Percent: 0.04 %
Brady Statistic AS VP Percent: 9.71 %
Brady Statistic AS VS Percent: 90.22 %
Brady Statistic RA Percent Paced: 0.08 %
Brady Statistic RV Percent Paced: 9.76 %
Date Time Interrogation Session: 20231024210926
Implantable Lead Connection Status: 753985
Implantable Lead Connection Status: 753985
Implantable Lead Implant Date: 20190425
Implantable Lead Implant Date: 20190425
Implantable Lead Location: 753859
Implantable Lead Location: 753860
Implantable Lead Model: 5076
Implantable Lead Model: 5076
Implantable Pulse Generator Implant Date: 20190425
Lead Channel Impedance Value: 361 Ohm
Lead Channel Impedance Value: 361 Ohm
Lead Channel Impedance Value: 399 Ohm
Lead Channel Impedance Value: 418 Ohm
Lead Channel Pacing Threshold Amplitude: 0.5 V
Lead Channel Pacing Threshold Amplitude: 0.875 V
Lead Channel Pacing Threshold Pulse Width: 0.4 ms
Lead Channel Pacing Threshold Pulse Width: 0.4 ms
Lead Channel Sensing Intrinsic Amplitude: 13.125 mV
Lead Channel Sensing Intrinsic Amplitude: 13.125 mV
Lead Channel Sensing Intrinsic Amplitude: 2.25 mV
Lead Channel Sensing Intrinsic Amplitude: 2.25 mV
Lead Channel Setting Pacing Amplitude: 1.5 V
Lead Channel Setting Pacing Amplitude: 2.5 V
Lead Channel Setting Pacing Pulse Width: 0.4 ms
Lead Channel Setting Sensing Sensitivity: 2 mV
Zone Setting Status: 755011
Zone Setting Status: 755011

## 2022-07-12 DIAGNOSIS — E1165 Type 2 diabetes mellitus with hyperglycemia: Secondary | ICD-10-CM | POA: Diagnosis not present

## 2022-07-22 NOTE — Progress Notes (Signed)
Remote pacemaker transmission.   

## 2022-07-30 ENCOUNTER — Telehealth: Payer: Self-pay | Admitting: Family Medicine

## 2022-07-30 NOTE — Telephone Encounter (Signed)
Left message for patient to call back and schedule Medicare Annual Wellness Visit (AWV) in office.   If not able to come in office, please offer to do virtually or by telephone.  Left office number and my jabber [phone].  AWVI eligible as of  05/10/2021  Please schedule at anytime with Nurse Health Advisor.

## 2022-08-11 DIAGNOSIS — E1165 Type 2 diabetes mellitus with hyperglycemia: Secondary | ICD-10-CM | POA: Diagnosis not present

## 2022-08-13 ENCOUNTER — Telehealth: Payer: Self-pay | Admitting: Family Medicine

## 2022-08-13 NOTE — Telephone Encounter (Signed)
Left message for patient to call back and schedule Medicare Annual Wellness Visit (AWV) in office.   If not able to come in office, please offer to do virtually or by telephone.  Left office number and my jabber [phone].  AWVI elgible as of 05/10/2021  Please schedule at anytime with Nurse Health Advisor.

## 2022-08-16 ENCOUNTER — Ambulatory Visit: Payer: Medicare PPO | Admitting: Internal Medicine

## 2022-09-10 DIAGNOSIS — E1165 Type 2 diabetes mellitus with hyperglycemia: Secondary | ICD-10-CM | POA: Diagnosis not present

## 2022-09-26 ENCOUNTER — Other Ambulatory Visit: Payer: Self-pay

## 2022-09-26 MED ORDER — TRULICITY 4.5 MG/0.5ML ~~LOC~~ SOAJ: 4.5000 mg | SUBCUTANEOUS | 3 refills | Status: DC

## 2022-09-27 ENCOUNTER — Telehealth: Payer: Self-pay

## 2022-09-27 MED ORDER — SEMAGLUTIDE (1 MG/DOSE) 4 MG/3ML ~~LOC~~ SOPN: 1.0000 mg | PEN_INJECTOR | SUBCUTANEOUS | 11 refills | Status: DC

## 2022-09-27 NOTE — Telephone Encounter (Signed)
Per fax from [facility] 4.3,3 and 4.5mg  are all on back order.

## 2022-10-01 ENCOUNTER — Ambulatory Visit: Payer: Medicare PPO | Attending: Cardiology

## 2022-10-01 DIAGNOSIS — I442 Atrioventricular block, complete: Secondary | ICD-10-CM | POA: Diagnosis not present

## 2022-10-01 LAB — CUP PACEART REMOTE DEVICE CHECK
Battery Remaining Longevity: 125 mo
Battery Voltage: 3.01 V
Brady Statistic AP VP Percent: 0.03 %
Brady Statistic AP VS Percent: 0.03 %
Brady Statistic AS VP Percent: 15.21 %
Brady Statistic AS VS Percent: 84.73 %
Brady Statistic RA Percent Paced: 0.08 %
Brady Statistic RV Percent Paced: 15.26 %
Date Time Interrogation Session: 20240122190206
Implantable Lead Connection Status: 753985
Implantable Lead Connection Status: 753985
Implantable Lead Implant Date: 20190425
Implantable Lead Implant Date: 20190425
Implantable Lead Location: 753859
Implantable Lead Location: 753860
Implantable Lead Model: 5076
Implantable Lead Model: 5076
Implantable Pulse Generator Implant Date: 20190425
Lead Channel Impedance Value: 323 Ohm
Lead Channel Impedance Value: 342 Ohm
Lead Channel Impedance Value: 380 Ohm
Lead Channel Impedance Value: 418 Ohm
Lead Channel Pacing Threshold Amplitude: 0.5 V
Lead Channel Pacing Threshold Amplitude: 0.875 V
Lead Channel Pacing Threshold Pulse Width: 0.4 ms
Lead Channel Pacing Threshold Pulse Width: 0.4 ms
Lead Channel Sensing Intrinsic Amplitude: 1.875 mV
Lead Channel Sensing Intrinsic Amplitude: 1.875 mV
Lead Channel Sensing Intrinsic Amplitude: 10.875 mV
Lead Channel Sensing Intrinsic Amplitude: 10.875 mV
Lead Channel Setting Pacing Amplitude: 1.5 V
Lead Channel Setting Pacing Amplitude: 2.5 V
Lead Channel Setting Pacing Pulse Width: 0.4 ms
Lead Channel Setting Sensing Sensitivity: 2 mV
Zone Setting Status: 755011
Zone Setting Status: 755011

## 2022-10-03 DIAGNOSIS — Z961 Presence of intraocular lens: Secondary | ICD-10-CM | POA: Diagnosis not present

## 2022-10-03 DIAGNOSIS — E119 Type 2 diabetes mellitus without complications: Secondary | ICD-10-CM | POA: Diagnosis not present

## 2022-10-03 DIAGNOSIS — H35341 Macular cyst, hole, or pseudohole, right eye: Secondary | ICD-10-CM | POA: Diagnosis not present

## 2022-10-03 LAB — HM DIABETES EYE EXAM

## 2022-10-07 DIAGNOSIS — H25812 Combined forms of age-related cataract, left eye: Secondary | ICD-10-CM | POA: Diagnosis not present

## 2022-10-07 DIAGNOSIS — H35341 Macular cyst, hole, or pseudohole, right eye: Secondary | ICD-10-CM | POA: Diagnosis not present

## 2022-10-07 DIAGNOSIS — H338 Other retinal detachments: Secondary | ICD-10-CM | POA: Diagnosis not present

## 2022-10-07 DIAGNOSIS — Z961 Presence of intraocular lens: Secondary | ICD-10-CM | POA: Diagnosis not present

## 2022-10-07 DIAGNOSIS — H59811 Chorioretinal scars after surgery for detachment, right eye: Secondary | ICD-10-CM | POA: Diagnosis not present

## 2022-10-07 DIAGNOSIS — H5315 Visual distortions of shape and size: Secondary | ICD-10-CM | POA: Diagnosis not present

## 2022-10-12 DIAGNOSIS — E1165 Type 2 diabetes mellitus with hyperglycemia: Secondary | ICD-10-CM | POA: Diagnosis not present

## 2022-10-21 DIAGNOSIS — H338 Other retinal detachments: Secondary | ICD-10-CM | POA: Diagnosis not present

## 2022-10-21 DIAGNOSIS — H59811 Chorioretinal scars after surgery for detachment, right eye: Secondary | ICD-10-CM | POA: Diagnosis not present

## 2022-10-21 DIAGNOSIS — H26491 Other secondary cataract, right eye: Secondary | ICD-10-CM | POA: Diagnosis not present

## 2022-10-21 DIAGNOSIS — H35341 Macular cyst, hole, or pseudohole, right eye: Secondary | ICD-10-CM | POA: Diagnosis not present

## 2022-10-21 DIAGNOSIS — H35351 Cystoid macular degeneration, right eye: Secondary | ICD-10-CM | POA: Diagnosis not present

## 2022-10-21 NOTE — Progress Notes (Signed)
[facility] [address] [place]. [place] [address] [phone]       FOLLOW-UP NOTE  Brittany [name] Date of Birth:  Feb 03, 1961 Medical Record Number:  [id number]   Reason for visit: Follow-up stroke   Chief Complaint  Patient presents with   Follow-up    RM 3 alone  Pt is well and stable, no new concerns       HPI:   [name] is a very pleasant 62 year old female who returns today 10/22/2022 after prior visit on 10/22/2021, for routine follow-up for hx of cryptogenic left frontal parietal stroke on 12/06/2017 s/p ILR 12/23/2017 - ILR explanted with MDT dual chamber PPM implant on 01/01/2018, post stroke seizures, MCI and tremor    Left MCA stroke 11/2017 -Residual expressive aphasia and cognitive impairment- at times can fluctuate but overall stable -Remains on clopidogrel and Crestor 20 mg daily -Continues to follow with endocrinology Dr. Everardo All for DM management, has been working on diet and weight loss, reports improvement of glucose levels over the past several months -Blood pressure today 143/70 -pacemaker has not shown evidence of atrial fibrillation thus far   Post stroke seizure -No recurrent episodes/seizure activity  -Continues on Keppra 500 mg twice daily tolerating without side effects   Depression/anxiety Adjustment disorder -Overall stable with ongoing use of sertraline 200 mg nightly (per PCP).   -occasional/rare use of ativan PRN with benefit   Essential tremors -stable without worsening  -Continues on primidone 250 mg daily tolerating well without side effects      ROS:   14 system review of systems is positive for those listed in HPI and all other systems negative   PMH:  Past Medical History:  Diagnosis Date   Asthma    Cataract    Phreesia 10/03/2020   Chronic lower back pain    Depression    Depression    Phreesia 10/03/2020   Diabetes mellitus without complication (HCC)    Phreesia 10/03/2020    Family history of adverse reaction to anesthesia    Mother has trouble coming out of anesthesia; patient reports she has no problems   Fibromyalgia    High cholesterol    Hypertension    Phreesia 10/03/2020   Presence of permanent cardiac pacemaker    Seizures (HCC) 11/2017   "day after she came home from hospital after having stroke" (01/01/2018)   Stroke (HCC) 12/06/2017   "speech issues since" (01/01/2018)   Type II diabetes mellitus (HCC)     Social History:  Social History   Socioeconomic History   Marital status: Married    Spouse name: Brittany [name]   Number of children: 2   Years of education: 14   Highest education level: Not on file  Occupational History   Not on file  Tobacco Use   Smoking status: Former    Packs/day: 1.00    Years: 38.00    Total pack years: 38.00    Types: Cigarettes   Smokeless tobacco: Never   Tobacco comments:    Pt used chantix July till January, quit first of the year   Vaping Use   Vaping Use: Never used  Substance and Sexual Activity   Alcohol use: Yes    Comment: 01/01/2018 "nothing since stroke 11/2017"   Drug use: No   Sexual activity: Not Currently  Other Topics Concern   Not on file  Social History Narrative   Patient is married (Brittany [name]) and lives at home with her family  Patient has two children.   Patient works at [employer].   Patient has a college education.   Patient is right-handed.   Patient drinks 3 cups of coffee M-F.      Social Determinants of Health   Financial Resource Strain: Not on file  Food Insecurity: Not on file  Transportation Needs: Not on file  Physical Activity: Not on file  Stress: Not on file  Social Connections: Not on file  Intimate Partner Violence: Not on file    Medications:   Current Outpatient Medications on File Prior to Visit  Medication Sig Dispense Refill   ACCU-CHEK GUIDE test strip USE AS DIRECTED FOUR TIMES DAILY 100 strip 5   acetaminophen (TYLENOL) 500 MG tablet Take 1,000  mg by mouth every 6 (six) hours as needed for fever (pain).     B Complex-Biotin-FA (MULTI-B COMPLEX) CAPS Take one a day ,B complex  multivitamin over the counter or use prenatal vitamin. 90 capsule 0   Blood Glucose Monitoring Suppl (ACCU-CHEK GUIDE ME) w/Device KIT 1 each by Does not apply route 4 (four) times daily. E11.9 1 kit 0   clopidogrel (PLAVIX) 75 MG tablet TAKE 1 TABLET(75 MG) BY MOUTH DAILY 90 tablet 3   Continuous Blood Gluc Sensor (DEXCOM G7 SENSOR) MISC 1 Device by Does not apply route as directed. 9 each 3   Dulaglutide (TRULICITY) 4.5 MG/0.5ML SOPN Inject 4.5 mg as directed once a week. 6 mL 3   insulin aspart (NOVOLOG FLEXPEN) 100 UNIT/ML FlexPen Max daily 30 units per scale 30 mL 3   insulin glargine (LANTUS SOLOSTAR) 100 UNIT/ML Solostar Pen Inject 20 Units into the skin daily. 15 mL 6   Insulin Pen Needle (BD PEN NEEDLE NANO 2ND GEN) 32G X 4 MM MISC USE FOUR TIMES DAILY AS DIRECTED 400 each 3   irbesartan (AVAPRO) 150 MG tablet TAKE 1 TABLET(150 MG) BY MOUTH DAILY 90 tablet 3   Lancets (ONETOUCH DELICA PLUS LANCET33G) MISC USE FOUR TIMES DAILY 400 each 3   levETIRAcetam (KEPPRA) 500 MG tablet Take 1 tablet (500 mg total) by mouth 2 (two) times daily. 180 tablet 3   LORazepam (ATIVAN) 0.5 MG tablet Take 0.5-1 tablets (0.25-0.5 mg total) by mouth 2 (two) times daily as needed for anxiety (anxiety and tremors (social situations)). 45 tablet 0   primidone (MYSOLINE) 250 MG tablet Take 1 tablet (250 mg total) by mouth daily. TAKE 1 TABLET(250 MG) BY MOUTH DAILY 90 tablet 3   rosuvastatin (CRESTOR) 20 MG tablet TAKE 1 TABLET(20 MG) BY MOUTH DAILY 90 tablet 3   Semaglutide, 1 MG/DOSE, 4 MG/3ML SOPN Inject 1 mg as directed once a week. 3 mL 11   sertraline (ZOLOFT) 100 MG tablet TAKE 2 TABLETS(200 MG) BY MOUTH AT BEDTIME 180 tablet 1   No current facility-administered medications on file prior to visit.    Allergies:  No Known Allergies  Today's Vitals   10/22/22 1417  BP: (!)  143/70  Pulse: 88  Weight: 166 lb (75.3 kg)  Height: 5\' 3"  (1.6 m)    Body mass index is 29.41 kg/m.  General: well developed, well nourished, very pleasant middle-age Caucasian female, seated, in no evident distress Head: head normocephalic and atraumatic.   Neck: supple with no carotid or supraclavicular bruits Cardiovascular: regular rate and rhythm, no murmurs Musculoskeletal: no deformity Skin:  no rash/petichiae Vascular:  Normal pulses all extremities   Neurologic Exam Mental Status: Awake and fully alert.  Mild expressive aphasia  with speech hesitancy.  Oriented to place and time. Recent memory impaired and remote memory intact. Attention span, concentration and fund of knowledge appropriate today. Mood and affect appropriate throughout visit Cranial Nerves: Pupils equal, briskly reactive to light. Extraocular movements full without nystagmus. Visual fields full to confrontation. Hearing intact. Facial sensation intact. Face, tongue, palate moves normally and symmetrically.  Motor: Normal bulk and tone. Normal strength in all tested extremity muscles.  Sensory.: intact to touch , pinprick , position and vibratory sensation.  Coordination: Mild tremor head and upper arms R>L with outstretched arms and amplified RUE finger-to-nose testing.  No evidence of lower extremity tremor or resting tremor.  No evidence of cogwheel rigidity. Gait and Station: Arises from chair without difficulty. Stance is normal. Gait demonstrates normal stride length and balance without use of assistive device Reflexes: 1+ and symmetric. Toes downgoing.      ASSESSMENT/PLAN: 62 year old lady with aphasia secondary to left MCA infarct in April 2019 of cryptogenic etiology treated with IV TPA and mechanical thrombectomy with good recanalization. She also had symptomatic seizure after discharge and residual stroke deficit of expressive aphasia and cognitive impairment currently receiving long-term disability.   Restarted on primidone due to essential tremors which has been stable.  Great difficulty with poststroke depression/anxiety and adjustment disorder which has since stabilized    Left MCA stroke -Continue Plavix and Crestor for secondary stroke prevention -Close PCP follow-up for aggressive stroke risk factor management including HTN with BP goal<130/90, HLD with LDL goal<70 and DM with A1c goal<7.  -lipid panel 05/2022 44 -pacer routinely monitored by cardiology -negative for atrial fibrillation   Post stroke seizure -Continue Keppra 500 mg twice daily for seizure prophylaxis - refill provided -No recurrent seizure episodes.  Advised to call with any seizure symptoms or events   Essential tremors -Continue primidone 250 mg daily -refill provided -Worse with increased anxiety and social situations - recommend trialing low-dose Ativan (as already prescribed and tolerates without side effects) prior to social events or increased stressful situation for possible benefit.     Follow-up in 1 year or call earlier if needed   CC:  Shade Flood, MD     I spent 31 minutes of face-to-face and non-face-to-face time with patient.  This included previsit chart review, lab review, study review, order entry, electronic health record documentation, patient education and discussion regarding above plan disease and treatment plan and answered all the questions to patient's satisfaction  Ihor Austin, [facility]  [facility] [address] [place], [place] [address]  Phone [phone] Fax [phone] Note: This document was prepared with digital dictation and possible smart phrase technology. Any transcriptional errors that result from this process are unintentional.

## 2022-10-22 ENCOUNTER — Ambulatory Visit: Payer: Medicare PPO | Admitting: Adult Health

## 2022-10-22 ENCOUNTER — Encounter: Payer: Self-pay | Admitting: Adult Health

## 2022-10-22 VITALS — BP 143/70 | HR 88 | Ht 63.0 in | Wt 166.0 lb

## 2022-10-22 DIAGNOSIS — I639 Cerebral infarction, unspecified: Secondary | ICD-10-CM | POA: Diagnosis not present

## 2022-10-22 DIAGNOSIS — R569 Unspecified convulsions: Secondary | ICD-10-CM | POA: Diagnosis not present

## 2022-10-22 DIAGNOSIS — G250 Essential tremor: Secondary | ICD-10-CM

## 2022-10-22 DIAGNOSIS — Z8673 Personal history of transient ischemic attack (TIA), and cerebral infarction without residual deficits: Secondary | ICD-10-CM

## 2022-10-22 MED ORDER — PRIMIDONE 250 MG PO TABS
250.0000 mg | ORAL_TABLET | Freq: Every day | ORAL | 3 refills | Status: DC
Start: 2022-10-22 — End: 2023-09-25

## 2022-10-22 MED ORDER — LEVETIRACETAM 500 MG PO TABS
500.0000 mg | ORAL_TABLET | Freq: Two times a day (BID) | ORAL | 3 refills | Status: DC
Start: 2022-10-22 — End: 2023-09-25

## 2022-10-22 NOTE — Patient Instructions (Addendum)
Your Plan:  Continue current treatment plan at this time  Refills provided for keppra and primidone   Continue use of Ativan as needed for anxiety or tremors - please let me know if you need a refill in the next year    Follow up in 1 year or call earlier if needed      Thank you for coming to see [place] at [facility]. I hope we have been able to provide you high quality care today.  You may receive a patient satisfaction survey over the next few weeks. We would appreciate your feedback and comments so that we may continue to improve ourselves and the health of our patients.

## 2022-10-29 DIAGNOSIS — H35341 Macular cyst, hole, or pseudohole, right eye: Secondary | ICD-10-CM | POA: Diagnosis not present

## 2022-10-29 NOTE — Progress Notes (Signed)
Remote pacemaker transmission.   

## 2022-11-06 ENCOUNTER — Ambulatory Visit: Payer: Medicare PPO | Admitting: Family Medicine

## 2022-11-11 ENCOUNTER — Ambulatory Visit: Payer: Medicare PPO | Admitting: Family Medicine

## 2022-11-11 VITALS — BP 118/70 | HR 89 | Temp 97.9°F | Ht 63.0 in | Wt 162.6 lb

## 2022-11-11 DIAGNOSIS — H35371 Puckering of macula, right eye: Secondary | ICD-10-CM | POA: Diagnosis not present

## 2022-11-11 DIAGNOSIS — E1149 Type 2 diabetes mellitus with other diabetic neurological complication: Secondary | ICD-10-CM

## 2022-11-11 DIAGNOSIS — H35353 Cystoid macular degeneration, bilateral: Secondary | ICD-10-CM | POA: Diagnosis not present

## 2022-11-11 DIAGNOSIS — F331 Major depressive disorder, recurrent, moderate: Secondary | ICD-10-CM | POA: Diagnosis not present

## 2022-11-11 DIAGNOSIS — H5315 Visual distortions of shape and size: Secondary | ICD-10-CM | POA: Diagnosis not present

## 2022-11-11 DIAGNOSIS — I6930 Unspecified sequelae of cerebral infarction: Secondary | ICD-10-CM

## 2022-11-11 DIAGNOSIS — I10 Essential (primary) hypertension: Secondary | ICD-10-CM

## 2022-11-11 DIAGNOSIS — H25812 Combined forms of age-related cataract, left eye: Secondary | ICD-10-CM | POA: Diagnosis not present

## 2022-11-11 DIAGNOSIS — Z794 Long term (current) use of insulin: Secondary | ICD-10-CM

## 2022-11-11 MED ORDER — SERTRALINE HCL 100 MG PO TABS: ORAL_TABLET | ORAL | 1 refills | Status: DC

## 2022-11-11 MED ORDER — CLOPIDOGREL BISULFATE 75 MG PO TABS: ORAL_TABLET | ORAL | 3 refills | Status: DC

## 2022-11-11 MED ORDER — IRBESARTAN 150 MG PO TABS: ORAL_TABLET | ORAL | 3 refills | Status: DC

## 2022-11-11 NOTE — Progress Notes (Unsigned)
Subjective:  Patient ID: Brittany [name], female    DOB: 06/11/1961  Age: 62 y.o. MRN: [id number]  CC:  Chief Complaint  Patient presents with   Hypertension   Depression    HPI Brittany [name] presents for   Hypertension: With history of Left MCA stroke, visit last month with neurology.  continued on Keppra, with primidone for tremors.  Goal blood pressure under 130/90, LDL under 70, A1c under 7.  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  Treated with irbesartan 150 mg daily, and Plavix 75 mg daily with history of CVA. No new bleeding or side effects with meds.  No new weakness.  BP Readings from Last 3 Encounters:  11/11/22 118/70  10/22/22 (!) 143/70  06/06/22 128/68   Lab Results  Component Value Date   CREATININE 0.58 06/06/2022   Hyperlipidemia: As above, goal LDL less than 70.  Stable on most recent labs.  Crestor 20 mg daily without any new side effects. Not fasting today. Ate 1 hour ago. No new myalgias.   Lab Results  Component Value Date   CHOL 108 06/06/2022   HDL 47.30 06/06/2022   LDLCALC 44 06/06/2022   LDLDIRECT 61 12/03/2013   TRIG 83.0 06/06/2022   CHOLHDL 2 06/06/2022   Lab Results  Component Value Date   ALT 10 06/06/2022   AST 11 06/06/2022   ALKPHOS 94 06/06/2022   BILITOT 0.4 06/06/2022    Depression: Discussed in 2023/06/03.  Continued on sertraline 200 mg daily, lorazepam if needed but had not required it recently.  Some family stressors but declined meeting with therapist.  Rare need for lorazepam. Son's GF passed away in 06-03-2023 with sepsis. Helping to take care of 6 young children. Husband and son helps. Keeps her going, busy.  Managing depression ok with current meds - no changes requested.  No recent need for alprazolam. No need for therapy at this time.  Some eye issues managed by optho.      11/11/2022    1:49 PM 06/06/2022   11:12 AM 04/08/2022    1:28 PM 10/08/2021    1:25 PM 10/06/2020    4:06 PM   Depression screen PHQ 2/9  Decreased Interest 0 0 0 0 0  Down, Depressed, Hopeless 0 1 0 0 0  PHQ - 2 Score 0 1 0 0 0  Altered sleeping 0 '1 2 1   '$ Tired, decreased energy 0 0 1 1   Change in appetite 0 0 1 1   Feeling bad or failure about yourself  0 1 0 0   Trouble concentrating 0 0 0 0   Moving slowly or fidgety/restless 1 0 0 1   Suicidal thoughts 0 0 0 0   PHQ-9 Score '1 3 4 4       '$ Diabetes, followed by endocrinology.  Last visit July 2023.  Levemir switched to Lantus, NovoLog 5 units with breakfast, 8 units lunch, 8 units supper and continued on Trulicity 4.5 mg weekly.  Appointment in December was canceled, follow-up appointment on March 13 planned.      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 ([place]) s/p IV tPA and mechanical intervention 12/12/2017   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 cerebrovascular accident (CVA) ([place]) 12/12/2017   Acute right hemiparesis ([place]) 12/12/2017   History of completed stroke 12/12/2017   Leukocytosis 12/12/2017   Hypokalemia 12/12/2017   Hypotension    Middle cerebral artery embolism, left 12/06/2017   Poor compliance with CPAP treatment 03/04/2017   OSA and COPD overlap syndrome ([place]) 03/04/2017   Simple chronic bronchitis ([place]) 03/04/2017   MCI (mild cognitive impairment) 11/27/2016   Left sided lacunar infarction ([place]) 03/11/2014   Cerebrovascular small vessel disease 03/11/2014   Insomnia, idiopathic 12/20/2013   Snoring 12/06/2013   Essential hypertension, benign 10/26/2012   Hyperlipidemia LDL goal <70 10/26/2012   Polypharmacy 10/26/2012    DM (diabetes mellitus) ([place]) 10/13/2011   Tremor 10/13/2011   GERD (gastroesophageal reflux disease) 10/13/2011   Depression 10/13/2011   Past Medical History:  Diagnosis Date   Asthma    Cataract    Phreesia 10/03/2020   Chronic lower back pain    Depression    Depression    Phreesia 10/03/2020   Diabetes mellitus without complication ([place])    Phreesia 10/03/2020   Family history of adverse reaction to anesthesia    Mother has trouble coming out of anesthesia; patient reports she has no problems   Fibromyalgia    High cholesterol    Hypertension    Phreesia 10/03/2020   Presence of permanent cardiac pacemaker    Seizures ([place]) 11/2017   "day after she came home from hospital after having stroke" (01/01/2018)   Stroke ([place]) 12/06/2017   "speech issues since" (01/01/2018)   Type II diabetes mellitus [facility])    Past Surgical History:  Procedure Laterality Date   CHOLECYSTECTOMY     EYE SURGERY N/A    Phreesia 10/03/2020   GAS/FLUID EXCHANGE Right 12/12/2019   Procedure: Gas/Fluid Exchange;  Surgeon: Jalene Mullet, MD;  Location: [facility];  Service: Ophthalmology;  Laterality: Right;   INSERT / REPLACE / REMOVE PACEMAKER  01/01/2018   IR CT HEAD LTD  12/06/2017   IR PERCUTANEOUS ART THROMBECTOMY/INFUSION INTRACRANIAL INC DIAG ANGIO  12/06/2017   LOOP RECORDER INSERTION N/A 12/12/2017   Procedure: LOOP RECORDER INSERTION;  Surgeon: Constance Haw, MD;  Location: [place] [facility];  Service: Cardiovascular;  Laterality: N/A;   LOOP RECORDER REMOVAL  01/01/2018   LOOP RECORDER REMOVAL N/A 01/01/2018   Procedure: LOOP RECORDER REMOVAL;  Surgeon: Constance Haw, MD;  Location: [place] [facility];  Service: Cardiovascular;  Laterality: N/A;   NECK SURGERY     PACEMAKER IMPLANT N/A 01/01/2018   Procedure: PACEMAKER IMPLANT;  Surgeon: Constance Haw, MD;  Location: [place] [facility];  Service: Cardiovascular;  Laterality: N/A;   PHOTOCOAGULATION Right 12/12/2019    Procedure: Photocoagulation;  Surgeon: Jalene Mullet, MD;  Location: [place];  Service: Ophthalmology;  Laterality: Right;   RADIOLOGY WITH ANESTHESIA N/A 12/06/2017   Procedure: CODE STROKE;  Surgeon: Luanne Bras, MD;  Location: [place];  Service: Radiology;  Laterality: N/A;   SCLERAL BUCKLE WITH POSSIBLE 25 GAUGE PARS PLANA VITRECTOMY Right 12/12/2019   Procedure: SCLERAL BUCKLE WITH  PARS PLANA VITRECTOMY RETINAL DETACHMENT REPAIR;  Surgeon: Jalene Mullet, MD;  Location: [place];  Service: Ophthalmology;  Laterality: Right;   TEE WITHOUT CARDIOVERSION N/A 12/10/2017   Procedure: TRANSESOPHAGEAL ECHOCARDIOGRAM (TEE);  Surgeon: Jerline Pain, MD;  Location[facility];  Service: Cardiovascular;  Laterality: N/A;  THROMBECTOMY FEMORAL ARTERY Right 12/07/2017   Procedure: THROMBECTOMY RIGHT FEMORAL ARTERY;  Surgeon: Waynetta Sandy, MD;  Location: [facility] OR;  Service: Vascular;  Laterality: Right;   TONSILLECTOMY     No Known Allergies Prior to Admission medications   Medication Sig Start Date End Date Taking? Authorizing Provider  ACCU-CHEK GUIDE test strip USE AS DIRECTED FOUR TIMES DAILY 09/18/21  Yes Renato Shin, MD  acetaminophen (TYLENOL) 500 MG tablet Take 1,000 mg by mouth every 6 (six) hours as needed for fever (pain).   Yes [provider]  B Complex-Biotin-FA (MULTI-B COMPLEX) CAPS Take one a day ,B complex  multivitamin over the counter or use prenatal vitamin. 08/28/16  Yes Dohmeier, Asencion Partridge, MD  Blood Glucose Monitoring Suppl (ACCU-CHEK GUIDE ME) w/Device KIT 1 each by Does not apply route 4 (four) times daily. E11.9 05/22/20  Yes Renato Shin, MD  clopidogrel (PLAVIX) 75 MG tablet TAKE 1 TABLET(75 MG) BY MOUTH DAILY 10/11/21  Yes Wendie Agreste, MD  Continuous Blood Gluc Sensor (DEXCOM G7 SENSOR) MISC 1 Device by Does not apply route as directed. 04/02/22  Yes Shamleffer, Melanie Crazier, MD  Dulaglutide (TRULICITY) 4.5 0000000 SOPN Inject 4.5 mg as directed once  a week. 09/26/22  Yes Shamleffer, Melanie Crazier, MD  insulin aspart (NOVOLOG FLEXPEN) 100 UNIT/ML FlexPen Max daily 30 units per scale 04/02/22  Yes Shamleffer, Melanie Crazier, MD  insulin glargine (LANTUS SOLOSTAR) 100 UNIT/ML Solostar Pen Inject 20 Units into the skin daily. 04/02/22  Yes Shamleffer, Melanie Crazier, MD  Insulin Pen Needle (BD PEN NEEDLE NANO 2ND GEN) 32G X 4 MM MISC USE FOUR TIMES DAILY AS DIRECTED 04/02/22  Yes Shamleffer, Melanie Crazier, MD  irbesartan (AVAPRO) 150 MG tablet TAKE 1 TABLET(150 MG) BY MOUTH DAILY 10/11/21  Yes Wendie Agreste, MD  Lancets [facility] DELICA PLUS [id number]) [place] 07/10/21  Yes Renato Shin, MD  levETIRAcetam (KEPPRA) 500 MG tablet Take 1 tablet (500 mg total) by mouth 2 (two) times daily. 10/22/22  Yes McCue, Janett Billow, NP  LORazepam (ATIVAN) 0.5 MG tablet Take 0.5-1 tablets (0.25-0.5 mg total) by mouth 2 (two) times daily as needed for anxiety (anxiety and tremors (social situations)). 10/16/20  Yes McCue, Janett Billow, NP  primidone (MYSOLINE) 250 MG tablet Take 1 tablet (250 mg total) by mouth daily. TAKE 1 TABLET(250 MG) BY MOUTH DAILY 10/22/22  Yes McCue, Janett Billow, NP  rosuvastatin (CRESTOR) 20 MG tablet TAKE 1 TABLET(20 MG) BY MOUTH DAILY 07/02/22  Yes Wendie Agreste, MD  Semaglutide, 1 MG/DOSE, 4 MG/3ML SOPN Inject 1 mg as directed once a week. 09/27/22  Yes Shamleffer, Melanie Crazier, MD  sertraline (ZOLOFT) 100 MG tablet TAKE 2 TABLETS(200 MG) BY MOUTH AT BEDTIME 05/27/22  Yes Wendie Agreste, MD   Social History   Socioeconomic History   Marital status: Married    Spouse name: [name]   Number of children: 2   Years of education: 14   Highest education level: Not on file  Occupational History   Not on file  Tobacco Use   Smoking status: Former    Packs/day: 1.00    Years: 38.00    Total pack years: 38.00    Types: Cigarettes   Smokeless tobacco: Never   Tobacco comments:    Pt used chantix July till January, quit  first of the year   Vaping Use   Vaping Use: Never used  Substance and Sexual Activity   Alcohol use: Yes    Comment:  01/01/2018 "nothing since stroke 11/2017"   Drug use: No   Sexual activity: Not Currently  Other Topics Concern   Not on file  Social History Narrative   Patient is married ([name]) and lives at home with her family   Patient has two children.   Patient works at [employer].   Patient has a college education.   Patient is right-handed.   Patient drinks 3 cups of coffee M-F.      Social Determinants of Health   Financial Resource Strain: Not on file  Food Insecurity: Not on file  Transportation Needs: Not on file  Physical Activity: Not on file  Stress: Not on file  Social Connections: Not on file  Intimate Partner Violence: Not on file    Review of Systems  Constitutional:  Negative for fatigue and unexpected weight change.  Respiratory:  Negative for chest tightness and shortness of breath.   Cardiovascular:  Negative for chest pain, palpitations and leg swelling.  Gastrointestinal:  Negative for abdominal pain and blood in stool.  Neurological:  Negative for dizziness, syncope, light-headedness and headaches.     Objective:   Vitals:   11/11/22 1354  BP: 118/70  Pulse: 89  Temp: 97.9 F (36.6 C)  TempSrc: Oral  SpO2: 97%  Weight: 162 lb 9.6 oz (73.8 kg)  Height: '5\' 3"'$  (1.6 m)     Physical Exam Vitals reviewed.  Constitutional:      Appearance: Normal appearance. She is well-developed.  HENT:     Head: Normocephalic and atraumatic.  Eyes:     Conjunctiva/sclera: Conjunctivae normal.     Pupils: Pupils are equal, round, and reactive to light.  Neck:     Vascular: No carotid bruit.  Cardiovascular:     Rate and Rhythm: Normal rate and regular rhythm.     Heart sounds: Normal heart sounds.  Pulmonary:     Effort: Pulmonary effort is normal.     Breath sounds: Normal breath sounds.  Abdominal:     Palpations: Abdomen is soft.  There is no pulsatile mass.     Tenderness: There is no abdominal tenderness.  Musculoskeletal:     Right lower leg: No edema.     Left lower leg: No edema.  Skin:    General: Skin is warm and dry.  Neurological:     Mental Status: She is alert and oriented to person, place, and time.  Psychiatric:        Mood and Affect: Mood normal.        Behavior: Behavior normal.        Assessment & Plan:  Brittany [name] is a 62 y.o. female . Type 2 diabetes mellitus with other neurologic complication, with long-term current use of insulin ([place]) - Plan: Urine Microalbumin w/creat. ratio, Hemoglobin A1c  Essential hypertension, benign - Plan: irbesartan (AVAPRO) 150 MG tablet  History of CVA with residual deficit - Plan: clopidogrel (PLAVIX) 75 MG tablet, Comprehensive metabolic panel, Lipid panel  Moderate episode of recurrent major depressive disorder (HCC) - Plan: sertraline (ZOLOFT) 100 MG tablet   Meds ordered this encounter  Medications   irbesartan (AVAPRO) 150 MG tablet    Sig: TAKE 1 TABLET(150 MG) BY MOUTH DAILY    Dispense:  90 tablet    Refill:  3   clopidogrel (PLAVIX) 75 MG tablet    Sig: TAKE 1 TABLET(75 MG) BY MOUTH DAILY    Dispense:  90 tablet    Refill:  3  sertraline (ZOLOFT) 100 MG tablet    Sig: TAKE 2 TABLETS(200 MG) BY MOUTH AT BEDTIME    Dispense:  180 tablet    Refill:  1   Patient Instructions  I will check labs today, but if elevated may need to repeat when you are fasting for at least 6 hours.  Shingles vaccine can be given at pharmacy.  No med changes today. Let  me know if anything changes. Hang in there!      Signed,   Merri Ray, MD [place] 11/11/22 2:21 PM

## 2022-11-11 NOTE — Patient Instructions (Addendum)
I will check labs today, but if elevated may need to repeat when you are fasting for at least 6 hours.  Shingles vaccine can be given at pharmacy.  No med changes today. Let  me know if anything changes. Hang in there!

## 2022-11-12 ENCOUNTER — Encounter: Payer: Self-pay | Admitting: Family Medicine

## 2022-11-12 LAB — MICROALBUMIN / CREATININE URINE RATIO
Creatinine,U: 152.5 mg/dL
Microalb Creat Ratio: 0.8 mg/g (ref 0.0–30.0)
Microalb, Ur: 1.2 mg/dL (ref 0.0–1.9)

## 2022-11-12 LAB — COMPREHENSIVE METABOLIC PANEL
ALT: 14 U/L (ref 0–35)
AST: 13 U/L (ref 0–37)
Albumin: 4 g/dL (ref 3.5–5.2)
Alkaline Phosphatase: 107 U/L (ref 39–117)
BUN: 13 mg/dL (ref 6–23)
CO2: 27 mEq/L (ref 19–32)
Calcium: 9.5 mg/dL (ref 8.4–10.5)
Chloride: 105 mEq/L (ref 96–112)
Creatinine, Ser: 0.68 mg/dL (ref 0.40–1.20)
GFR: 92.62 mL/min (ref >60.00)
Glucose, Bld: 159 mg/dL — ABNORMAL HIGH (ref 70–99)
Potassium: 3.8 mEq/L (ref 3.5–5.1)
Sodium: 141 mEq/L (ref 135–145)
Total Bilirubin: 0.3 mg/dL (ref 0.2–1.2)
Total Protein: 7.1 g/dL (ref 6.0–8.3)

## 2022-11-12 LAB — HEMOGLOBIN A1C: Hgb A1c MFr Bld: 6.7 % — ABNORMAL HIGH (ref 4.6–6.5)

## 2022-11-12 LAB — LIPID PANEL
Cholesterol: 99 mg/dL (ref 0–200)
HDL: 44.1 mg/dL (ref >39.00)
LDL Cholesterol: 34 mg/dL (ref 0–99)
NonHDL: 54.53
Total CHOL/HDL Ratio: 2
Triglycerides: 102 mg/dL (ref 0.0–149.0)
VLDL: 20.4 mg/dL (ref 0.0–40.0)

## 2022-11-20 ENCOUNTER — Encounter: Payer: Self-pay | Admitting: Internal Medicine

## 2022-11-20 ENCOUNTER — Ambulatory Visit: Payer: Medicare PPO | Admitting: Internal Medicine

## 2022-11-20 VITALS — BP 136/80 | HR 81 | Ht 63.0 in | Wt 162.0 lb

## 2022-11-20 DIAGNOSIS — E1159 Type 2 diabetes mellitus with other circulatory complications: Secondary | ICD-10-CM

## 2022-11-20 DIAGNOSIS — Z794 Long term (current) use of insulin: Secondary | ICD-10-CM

## 2022-11-20 DIAGNOSIS — E1149 Type 2 diabetes mellitus with other diabetic neurological complication: Secondary | ICD-10-CM

## 2022-11-20 NOTE — Progress Notes (Signed)
Name: Brittany [name]  Age/ Sex: 62 y.o., female   MRN/ DOB: [id number], [birth year]     PCP: Wendie Agreste, MD   Reason for Endocrinology Evaluation: Type 2 Diabetes Mellitus  Initial Endocrine Consultative Visit: 05/04/2018    PATIENT IDENTIFIER: Brittany [name] is a 62 y.o. female with a past medical history of T2DM, HTN, fibromyalgia and dyslipidemia. The patient has followed with Endocrinology clinic since 05/04/2018 for consultative assistance with management of her diabetes.  DIABETIC HISTORY:  Brittany [name] was diagnosed with DM 1999, and started insulin therapy in 2019. Her hemoglobin A1c has ranged from 6.6% in 2013, peaking at >14.0% in 2018.    Was last seen by Dr. Loanne Drilling in March 2023  SUBJECTIVE:   During the last visit (04/02/2022): A1c 9.7%  Today (11/20/2022): Brittany [name] is here for a follow up on diabetes management.  She checks her blood sugars multiple  times daily, through CGM. 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 She had a follow-up with her PCP 11/2022  Had right eye sx  with blurry vision  Denies constipation or diarrhea  Husband is a caregiver but he  is on dialysis   Has bilateral feet tingling worse on the   HOME DIABETES REGIMEN:  Trulicity 4.5 mg weekly Lantus 20 units daily  NovoLog 5/8/8 units with meals  correction scale: NovoLog (BG -130/40)    Statin: yes ACE-I/ARB: Yes    CONTINUOUS GLUCOSE MONITORING RECORD INTERPRETATION : unable to download , pt does not recall password but reviewed manually    Dates of Recording: 12/13/20223-11/20/2022  Sensor description:dexcom G7  Results statistics:   CGM use % of time   Average and SD 163/  Time in range    71    %  % Time Above 180 25  % Time above 250 4  % Time Below target 0   Glycemic patterns summary: Bgs optimal overnight, trend up during the  day   Hyperglycemic episodes  postprandial   Hypoglycemic episodes occurred n/a  Overnight periods: stable         DIABETIC COMPLICATIONS: Microvascular complications:   Denies: CKD Last Eye Exam: Completed 10/2022  Macrovascular complications:  CVA Denies: CAD, PVD   HISTORY:  Past Medical History:  Past Medical History:  Diagnosis Date   Asthma    Cataract    Phreesia 10/03/2020   Chronic lower back pain    Depression    Depression    Phreesia 10/03/2020   Diabetes mellitus without complication ([place])    Phreesia 10/03/2020   Family history of adverse reaction to anesthesia    Mother has trouble coming out of anesthesia; patient reports she has no problems   Fibromyalgia    High cholesterol    Hypertension    Phreesia 10/03/2020   Presence of permanent cardiac pacemaker    Seizures ([place]) 11/2017   "day after she came home from hospital after having stroke" (01/01/2018)   Stroke ([place]) 12/06/2017   "speech issues since" (01/01/2018)   Type II diabetes mellitus ([place])    Past Surgical History:  Past Surgical History:  Procedure Laterality Date   CHOLECYSTECTOMY     EYE SURGERY N/A    Phreesia 10/03/2020   GAS/FLUID EXCHANGE Right 12/12/2019   Procedure: Gas/Fluid Exchange;  Surgeon: Jalene Mullet, MD;  Location: [facility];  Service: Ophthalmology;  Laterality: Right;   INSERT / REPLACE / REMOVE PACEMAKER  01/01/2018   IR CT HEAD LTD  12/06/2017   IR PERCUTANEOUS ART THROMBECTOMY/INFUSION INTRACRANIAL INC DIAG ANGIO  12/06/2017   LOOP RECORDER INSERTION N/A 12/12/2017   Procedure: LOOP RECORDER INSERTION;  Surgeon: Constance Haw, MD;  Location: [facility];  Service: Cardiovascular;  Laterality: N/A;   LOOP RECORDER REMOVAL  01/01/2018   LOOP RECORDER REMOVAL N/A 01/01/2018   Procedure: LOOP RECORDER REMOVAL;  Surgeon: Constance Haw, MD;  Location: [facility];  Service: Cardiovascular;  Laterality: N/A;   NECK SURGERY     PACEMAKER  IMPLANT N/A 01/01/2018   Procedure: PACEMAKER IMPLANT;  Surgeon: Constance Haw, MD;  Location: [facility];  Service: Cardiovascular;  Laterality: N/A;   PHOTOCOAGULATION Right 12/12/2019   Procedure: Photocoagulation;  Surgeon: Jalene Mullet, MD;  Location: [facility];  Service: Ophthalmology;  Laterality: Right;   RADIOLOGY WITH ANESTHESIA N/A 12/06/2017   Procedure: CODE STROKE;  Surgeon: Luanne Bras, MD;  Location: [facility];  Service: Radiology;  Laterality: N/A;   SCLERAL BUCKLE WITH POSSIBLE 25 GAUGE PARS PLANA VITRECTOMY Right 12/12/2019   Procedure: SCLERAL BUCKLE WITH  PARS PLANA VITRECTOMY RETINAL DETACHMENT REPAIR;  Surgeon: Jalene Mullet, MD;  Location: [facility];  Service: Ophthalmology;  Laterality: Right;   TEE WITHOUT CARDIOVERSION N/A 12/10/2017   Procedure: TRANSESOPHAGEAL ECHOCARDIOGRAM (TEE);  Surgeon: Jerline Pain, MD;  Location: [facility];  Service: Cardiovascular;  Laterality: N/A;   THROMBECTOMY FEMORAL ARTERY Right 12/07/2017   Procedure: THROMBECTOMY RIGHT FEMORAL ARTERY;  Surgeon: Waynetta Sandy, MD;  Location: [facility];  Service: Vascular;  Laterality: Right;   TONSILLECTOMY     Social History:  reports that she has quit smoking. Her smoking use included cigarettes. She has a 38.00 pack-year smoking history. She has never used smokeless tobacco. She reports current alcohol use. She reports that she does not use drugs. Family History:  Family History  Problem Relation Age of Onset   Cancer Mother    Diabetes Mother    Heart disease Mother    Parkinsonism Mother    Diabetes Father    Heart disease Father    Dementia Father    COPD Maternal Aunt    Cancer Maternal Grandmother 94       lung   Breast cancer Neg Hx      HOME MEDICATIONS: Allergies as of 11/20/2022   No Known Allergies      Medication List        Accurate as of November 20, 2022  9:32 AM. If you have any questions, ask your nurse or doctor.          Accu-Chek Guide Me  w/Device Kit 1 each by Does not apply route 4 (four) times daily. E11.9   Accu-Chek Guide test strip Generic drug: glucose blood USE AS DIRECTED FOUR TIMES DAILY   acetaminophen 500 MG tablet Commonly known as: TYLENOL Take 1,000 mg by mouth every 6 (six) hours as needed for fever (pain).   BD Pen Needle Nano 2nd Gen 32G X 4 MM Misc Generic drug: Insulin Pen Needle USE FOUR TIMES DAILY AS DIRECTED   clopidogrel 75 MG tablet Commonly known as: PLAVIX TAKE 1 TABLET(75 MG) BY MOUTH DAILY   Dexcom G7 Sensor Misc 1 Device by Does not apply route as directed.   irbesartan 150 MG tablet Commonly known as: AVAPRO TAKE 1 TABLET(150 MG) BY MOUTH DAILY  Lantus SoloStar 100 UNIT/ML Solostar Pen Generic drug: insulin glargine Inject 20 Units into the skin daily.   levETIRAcetam 500 MG tablet Commonly known as: KEPPRA Take 1 tablet (500 mg total) by mouth 2 (two) times daily.   LORazepam 0.5 MG tablet Commonly known as: Ativan Take 0.5-1 tablets (0.25-0.5 mg total) by mouth 2 (two) times daily as needed for anxiety (anxiety and tremors (social situations)).   Multi-B Complex Caps Take one a day ,B complex  multivitamin over the counter or use prenatal vitamin.   NovoLOG FlexPen 100 UNIT/ML FlexPen Generic drug: insulin aspart Max daily 30 units per scale   OneTouch Delica Plus 0000000 Misc USE FOUR TIMES DAILY   primidone 250 MG tablet Commonly known as: MYSOLINE Take 1 tablet (250 mg total) by mouth daily. TAKE 1 TABLET(250 MG) BY MOUTH DAILY   rosuvastatin 20 MG tablet Commonly known as: CRESTOR TAKE 1 TABLET(20 MG) BY MOUTH DAILY   Semaglutide (1 MG/DOSE) 4 MG/3ML Sopn Inject 1 mg as directed once a week.   sertraline 100 MG tablet Commonly known as: ZOLOFT TAKE 2 TABLETS(200 MG) BY MOUTH AT BEDTIME   Trulicity 4.5 0000000 Sopn Generic drug: Dulaglutide Inject 4.5 mg as directed once a week.         OBJECTIVE:   Vital Signs: BP 136/80 (BP Location:  Left Arm, Patient Position: Sitting, Cuff Size: Large)   Pulse 81   Ht '5\' 3"'$  (1.6 m)   Wt 162 lb (73.5 kg)   SpO2 99%   BMI 28.70 kg/m   Wt Readings from Last 3 Encounters:  11/20/22 162 lb (73.5 kg)  11/11/22 162 lb 9.6 oz (73.8 kg)  10/22/22 166 lb (75.3 kg)     Exam: General: Pt appears well and is in NAD  Lungs: Clear with good BS bilat with no rales, rhonchi, or wheezes  Heart: RRR   Abdomen: Normoactive bowel sounds, soft, nontender, without masses or organomegaly palpable  Extremities: No pretibial edema.   Neuro: MS is good with appropriate affect, pt is alert and Ox3    DM foot exam: 04/02/2022  The skin of the feet is intact without sores or ulcerations. The pedal pulses are 2+ on right and 2+ on left. The sensation is intact to a screening 5.07, 10 gram monofilament bilaterally        DATA REVIEWED:  Lab Results  Component Value Date   HGBA1C 6.7 (H) 11/11/2022   HGBA1C 9.7 (A) 04/02/2022   HGBA1C 7.9 (A) 12/04/2021    Latest Reference Range & Units 11/11/22 14:36  Sodium 135 - 145 mEq/L 141  Potassium 3.5 - 5.1 mEq/L 3.8  Chloride 96 - 112 mEq/L 105  CO2 19 - 32 mEq/L 27  Glucose 70 - 99 mg/dL 159 (H)  BUN 6 - 23 mg/dL 13  Creatinine 0.40 - 1.20 mg/dL 0.68  Calcium 8.4 - 10.5 mg/dL 9.5  Alkaline Phosphatase 39 - 117 U/L 107  Albumin 3.5 - 5.2 g/dL 4.0  AST 0 - 37 U/L 13  ALT 0 - 35 U/L 14  Total Protein 6.0 - 8.3 g/dL 7.1  Total Bilirubin 0.2 - 1.2 mg/dL 0.3  GFR >60.00 mL/min 92.62    Latest Reference Range & Units 11/11/22 14:36  Total CHOL/HDL Ratio  2  Cholesterol 0 - 200 mg/dL 99  HDL Cholesterol >39.00 mg/dL 44.10  LDL (calc) 0 - 99 mg/dL 34  MICROALB/CREAT RATIO 0.0 - 30.0 mg/g 0.8  NonHDL  54.53  Triglycerides 0.0 -  149.0 mg/dL 102.0  VLDL 0.0 - 40.0 mg/dL 20.4    Latest Reference Range & Units 11/11/22 14:36  Glucose 70 - 99 mg/dL 159 (H)  Hemoglobin A1C 4.6 - 6.5 % 6.7 (H)  (H): Data is abnormally high  ASSESSMENT / PLAN /  RECOMMENDATIONS:   1) Type 2 Diabetes Mellitus, optimally controlled, With Macrovascular complications - Most recent A1c of 6.7 %. Goal A1c < 7.0 %.    -A1c down from 9.7% to 6.7% -We briefly discussed SGLT2 inhibitors, we will consider this in the future if needed  - No changes   MEDICATIONS: Continue Trulicity 4.5 mg weekly Continue  Lantus 20 units daily Continue NovoLog 5 units with breakfast, 8 units with lunch, and 8 units with supper Continue correction scale: NovoLog (BG -130/40)  EDUCATION / INSTRUCTIONS: BG monitoring instructions: Patient is instructed to check her blood sugars 3 times a day, before each meal. Call [facility] clinic if: BG persistently < 70  I reviewed the Rule of 15 for the treatment of hypoglycemia in detail with the patient. Literature supplied.Marland Kitchen   2) Diabetic complications:  Eye: Does not have known diabetic retinopathy.  Neuro/ Feet: Does not have known diabetic peripheral neuropathy .  Renal: Patient does not have known baseline CKD. She   is  on an ACEI/ARB at present.    F/U in 6 months   Signed electronically by: Mack Guise, MD  [facility] End[facility]  [facility] [address] [place], [place] [address] Phone: [phone] FAX: [phone]   CC: Wendie Agreste, MD [address] [place] [address] Phone: [phone]  Fax: [phone]  Return to Endocrinology clinic as below: Future Appointments  Date Time Provider [place]  12/31/2022  7:50 AM [facility] DEVICE REMOTES [facility]  04/01/2023  7:50 AM [facility] DEVICE REMOTES [facility]  05/14/2023  1:40 PM Wendie Agreste, MD [facility]  10/22/2023  2:45 PM Frann Rider, NP [facility] None

## 2022-11-20 NOTE — Patient Instructions (Addendum)
Continue Lantus  20 units ONCE daily  Continue Novolog 5 units with Breakfast, 8 units with Lunch and 8 units with Supper  Continue trulicity 4.5 mg weekly    Novolog correctional insulin: ADD extra units on insulin to your meal-time Novolog dose if your blood sugars are higher than 170. Use the scale below to help guide you:   Blood sugar before meal Number of units to inject  Less than 170 0 unit  171 -  210 1 units  211 -  250 2 units  251 -  290 3 units  291 -  330 4 units  331 -  370 5 units  371 -  410 6 units  411 -  450 7 units  451 -  490 8 units    HOW TO TREAT LOW BLOOD SUGARS (Blood sugar LESS THAN 70 MG/DL) Please follow the RULE OF 15 for the treatment of hypoglycemia treatment (when your (blood sugars are less than 70 mg/dL)   STEP 1: Take 15 grams of carbohydrates when your blood sugar is low, which includes:  3-4 GLUCOSE TABS  OR 3-4 OZ OF JUICE OR REGULAR SODA OR ONE TUBE OF GLUCOSE GEL    STEP 2: RECHECK blood sugar in 15 MINUTES STEP 3: If your blood sugar is still low at the 15 minute recheck --> then, go back to STEP 1 and treat AGAIN with another 15 grams of carbohydrates.

## 2022-11-28 ENCOUNTER — Telehealth: Payer: Self-pay | Admitting: Family Medicine

## 2022-11-28 NOTE — Telephone Encounter (Signed)
Called patient to schedule Medicare Annual Wellness Visit (AWV). Left message for patient to call back and schedule Medicare Annual Wellness Visit (AWV).  Last date of AWV: AWVI eligible as of 05/10/2021  Please schedule an AWVI appointment at any time with [facility].  If any questions, please contact me at [phone].    Thank you,  [place] Direct dial  [phone]

## 2022-11-28 NOTE — Telephone Encounter (Signed)
[facility] to schedule their annual wellness visit. Patient declined to schedule AWV at this time.  Thank you,  [place] Direct dial  [phone]

## 2022-12-10 DIAGNOSIS — Z8669 Personal history of other diseases of the nervous system and sense organs: Secondary | ICD-10-CM | POA: Diagnosis not present

## 2022-12-10 DIAGNOSIS — Z48810 Encounter for surgical aftercare following surgery on the sense organs: Secondary | ICD-10-CM | POA: Diagnosis not present

## 2022-12-10 DIAGNOSIS — H26491 Other secondary cataract, right eye: Secondary | ICD-10-CM | POA: Diagnosis not present

## 2022-12-10 DIAGNOSIS — H338 Other retinal detachments: Secondary | ICD-10-CM | POA: Diagnosis not present

## 2022-12-10 DIAGNOSIS — T85398A Other mechanical complication of other ocular prosthetic devices, implants and grafts, initial encounter: Secondary | ICD-10-CM | POA: Diagnosis not present

## 2022-12-18 DIAGNOSIS — H35372 Puckering of macula, left eye: Secondary | ICD-10-CM | POA: Diagnosis not present

## 2022-12-18 DIAGNOSIS — H35342 Macular cyst, hole, or pseudohole, left eye: Secondary | ICD-10-CM | POA: Diagnosis not present

## 2022-12-18 DIAGNOSIS — H5315 Visual distortions of shape and size: Secondary | ICD-10-CM | POA: Diagnosis not present

## 2022-12-18 DIAGNOSIS — H25812 Combined forms of age-related cataract, left eye: Secondary | ICD-10-CM | POA: Diagnosis not present

## 2022-12-24 DIAGNOSIS — H35342 Macular cyst, hole, or pseudohole, left eye: Secondary | ICD-10-CM | POA: Diagnosis not present

## 2022-12-24 DIAGNOSIS — H35372 Puckering of macula, left eye: Secondary | ICD-10-CM | POA: Diagnosis not present

## 2022-12-31 ENCOUNTER — Ambulatory Visit (INDEPENDENT_AMBULATORY_CARE_PROVIDER_SITE_OTHER): Payer: Medicare PPO

## 2022-12-31 DIAGNOSIS — I442 Atrioventricular block, complete: Secondary | ICD-10-CM | POA: Diagnosis not present

## 2023-01-01 LAB — CUP PACEART REMOTE DEVICE CHECK
Battery Remaining Longevity: 122 mo
Battery Voltage: 3.01 V
Brady Statistic AP VP Percent: 0.14 %
Brady Statistic AP VS Percent: 0.12 %
Brady Statistic AS VP Percent: 7.48 %
Brady Statistic AS VS Percent: 92.26 %
Brady Statistic RA Percent Paced: 0.27 %
Brady Statistic RV Percent Paced: 7.65 %
Date Time Interrogation Session: 20240423192342
Implantable Lead Connection Status: 753985
Implantable Lead Connection Status: 753985
Implantable Lead Implant Date: 20190425
Implantable Lead Implant Date: 20190425
Implantable Lead Location: 753859
Implantable Lead Location: 753860
Implantable Lead Model: 5076
Implantable Lead Model: 5076
Implantable Pulse Generator Implant Date: 20190425
Lead Channel Impedance Value: 342 Ohm
Lead Channel Impedance Value: 342 Ohm
Lead Channel Impedance Value: 380 Ohm
Lead Channel Impedance Value: 418 Ohm
Lead Channel Pacing Threshold Amplitude: 0.5 V
Lead Channel Pacing Threshold Amplitude: 0.875 V
Lead Channel Pacing Threshold Pulse Width: 0.4 ms
Lead Channel Pacing Threshold Pulse Width: 0.4 ms
Lead Channel Sensing Intrinsic Amplitude: 11.625 mV
Lead Channel Sensing Intrinsic Amplitude: 11.625 mV
Lead Channel Sensing Intrinsic Amplitude: 2 mV
Lead Channel Sensing Intrinsic Amplitude: 2 mV
Lead Channel Setting Pacing Amplitude: 1.5 V
Lead Channel Setting Pacing Amplitude: 2.5 V
Lead Channel Setting Pacing Pulse Width: 0.4 ms
Lead Channel Setting Sensing Sensitivity: 2 mV
Zone Setting Status: 755011
Zone Setting Status: 755011

## 2023-01-13 ENCOUNTER — Telehealth: Payer: Self-pay

## 2023-01-13 NOTE — Telephone Encounter (Signed)
Clinical notes faxed to [facility] for Dexcom supplies.

## 2023-01-14 DIAGNOSIS — T85398A Other mechanical complication of other ocular prosthetic devices, implants and grafts, initial encounter: Secondary | ICD-10-CM | POA: Diagnosis not present

## 2023-01-14 DIAGNOSIS — H35342 Macular cyst, hole, or pseudohole, left eye: Secondary | ICD-10-CM | POA: Diagnosis not present

## 2023-01-14 DIAGNOSIS — Z48810 Encounter for surgical aftercare following surgery on the sense organs: Secondary | ICD-10-CM | POA: Diagnosis not present

## 2023-01-14 DIAGNOSIS — H43392 Other vitreous opacities, left eye: Secondary | ICD-10-CM | POA: Diagnosis not present

## 2023-01-14 DIAGNOSIS — Z8669 Personal history of other diseases of the nervous system and sense organs: Secondary | ICD-10-CM | POA: Diagnosis not present

## 2023-01-22 DIAGNOSIS — E1165 Type 2 diabetes mellitus with hyperglycemia: Secondary | ICD-10-CM | POA: Diagnosis not present

## 2023-01-29 NOTE — Progress Notes (Signed)
Remote pacemaker transmission.   

## 2023-02-19 DIAGNOSIS — Z961 Presence of intraocular lens: Secondary | ICD-10-CM | POA: Diagnosis not present

## 2023-02-19 DIAGNOSIS — H59031 Cystoid macular edema following cataract surgery, right eye: Secondary | ICD-10-CM | POA: Diagnosis not present

## 2023-02-19 DIAGNOSIS — H59811 Chorioretinal scars after surgery for detachment, right eye: Secondary | ICD-10-CM | POA: Diagnosis not present

## 2023-02-19 DIAGNOSIS — H338 Other retinal detachments: Secondary | ICD-10-CM | POA: Diagnosis not present

## 2023-02-19 DIAGNOSIS — H35011 Changes in retinal vascular appearance, right eye: Secondary | ICD-10-CM | POA: Diagnosis not present

## 2023-02-24 DIAGNOSIS — H34811 Central retinal vein occlusion, right eye, with macular edema: Secondary | ICD-10-CM | POA: Diagnosis not present

## 2023-02-24 DIAGNOSIS — H59811 Chorioretinal scars after surgery for detachment, right eye: Secondary | ICD-10-CM | POA: Diagnosis not present

## 2023-02-24 DIAGNOSIS — H35351 Cystoid macular degeneration, right eye: Secondary | ICD-10-CM | POA: Diagnosis not present

## 2023-02-24 DIAGNOSIS — H338 Other retinal detachments: Secondary | ICD-10-CM | POA: Diagnosis not present

## 2023-02-24 DIAGNOSIS — Z961 Presence of intraocular lens: Secondary | ICD-10-CM | POA: Diagnosis not present

## 2023-03-10 DIAGNOSIS — H34811 Central retinal vein occlusion, right eye, with macular edema: Secondary | ICD-10-CM | POA: Diagnosis not present

## 2023-04-01 ENCOUNTER — Ambulatory Visit (INDEPENDENT_AMBULATORY_CARE_PROVIDER_SITE_OTHER): Payer: Medicare PPO

## 2023-04-01 DIAGNOSIS — H26491 Other secondary cataract, right eye: Secondary | ICD-10-CM | POA: Diagnosis not present

## 2023-04-01 DIAGNOSIS — H35341 Macular cyst, hole, or pseudohole, right eye: Secondary | ICD-10-CM | POA: Diagnosis not present

## 2023-04-01 DIAGNOSIS — E119 Type 2 diabetes mellitus without complications: Secondary | ICD-10-CM | POA: Diagnosis not present

## 2023-04-01 DIAGNOSIS — H25812 Combined forms of age-related cataract, left eye: Secondary | ICD-10-CM | POA: Diagnosis not present

## 2023-04-01 DIAGNOSIS — I442 Atrioventricular block, complete: Secondary | ICD-10-CM

## 2023-04-01 LAB — HM DIABETES EYE EXAM

## 2023-04-03 LAB — CUP PACEART REMOTE DEVICE CHECK
Battery Remaining Longevity: 119 mo
Battery Voltage: 3.01 V
Brady Statistic AP VP Percent: 1.87 %
Brady Statistic AP VS Percent: 0.64 %
Brady Statistic AS VP Percent: 9.97 %
Brady Statistic AS VS Percent: 87.53 %
Brady Statistic RV Percent Paced: 11.85 %
Implantable Lead Connection Status: 753985
Implantable Lead Connection Status: 753985
Implantable Lead Implant Date: 20190425
Implantable Lead Location: 753860
Implantable Lead Model: 5076
Lead Channel Impedance Value: 323 Ohm
Lead Channel Impedance Value: 342 Ohm
Lead Channel Impedance Value: 380 Ohm
Lead Channel Impedance Value: 399 Ohm
Lead Channel Pacing Threshold Amplitude: 0.5 V
Lead Channel Pacing Threshold Amplitude: 0.875 V
Lead Channel Pacing Threshold Pulse Width: 0.4 ms
Lead Channel Pacing Threshold Pulse Width: 0.4 ms
Lead Channel Sensing Intrinsic Amplitude: 12.5 mV
Lead Channel Sensing Intrinsic Amplitude: 12.5 mV
Lead Channel Sensing Intrinsic Amplitude: 2.125 mV
Lead Channel Setting Pacing Amplitude: 1.5 V
Lead Channel Setting Pacing Amplitude: 2.5 V
Lead Channel Setting Pacing Pulse Width: 0.4 ms
Zone Setting Status: 755011

## 2023-04-09 DIAGNOSIS — H34811 Central retinal vein occlusion, right eye, with macular edema: Secondary | ICD-10-CM | POA: Diagnosis not present

## 2023-04-16 DIAGNOSIS — H2512 Age-related nuclear cataract, left eye: Secondary | ICD-10-CM | POA: Diagnosis not present

## 2023-04-17 NOTE — Progress Notes (Signed)
Remote pacemaker transmission.   

## 2023-04-18 DIAGNOSIS — H25812 Combined forms of age-related cataract, left eye: Secondary | ICD-10-CM | POA: Diagnosis not present

## 2023-04-23 DIAGNOSIS — E1165 Type 2 diabetes mellitus with hyperglycemia: Secondary | ICD-10-CM | POA: Diagnosis not present

## 2023-04-24 ENCOUNTER — Encounter (INDEPENDENT_AMBULATORY_CARE_PROVIDER_SITE_OTHER): Payer: Self-pay

## 2023-05-06 ENCOUNTER — Other Ambulatory Visit: Payer: Self-pay | Admitting: Internal Medicine

## 2023-05-08 ENCOUNTER — Other Ambulatory Visit: Payer: Self-pay | Admitting: Internal Medicine

## 2023-05-14 ENCOUNTER — Ambulatory Visit: Payer: Medicare PPO | Admitting: Family Medicine

## 2023-05-14 ENCOUNTER — Encounter: Payer: Self-pay | Admitting: Family Medicine

## 2023-05-14 VITALS — BP 126/78 | HR 80 | Temp 100.7°F | Ht 63.0 in | Wt 172.8 lb

## 2023-05-14 DIAGNOSIS — R233 Spontaneous ecchymoses: Secondary | ICD-10-CM | POA: Diagnosis not present

## 2023-05-14 DIAGNOSIS — Z794 Long term (current) use of insulin: Secondary | ICD-10-CM

## 2023-05-14 DIAGNOSIS — M79642 Pain in left hand: Secondary | ICD-10-CM

## 2023-05-14 DIAGNOSIS — I6930 Unspecified sequelae of cerebral infarction: Secondary | ICD-10-CM

## 2023-05-14 DIAGNOSIS — F331 Major depressive disorder, recurrent, moderate: Secondary | ICD-10-CM

## 2023-05-14 DIAGNOSIS — E785 Hyperlipidemia, unspecified: Secondary | ICD-10-CM | POA: Diagnosis not present

## 2023-05-14 DIAGNOSIS — E1149 Type 2 diabetes mellitus with other diabetic neurological complication: Secondary | ICD-10-CM

## 2023-05-14 DIAGNOSIS — I10 Essential (primary) hypertension: Secondary | ICD-10-CM

## 2023-05-14 MED ORDER — SERTRALINE HCL 100 MG PO TABS: ORAL_TABLET | ORAL | 1 refills | Status: DC

## 2023-05-14 MED ORDER — ROSUVASTATIN CALCIUM 20 MG PO TABS: 20.0000 mg | ORAL_TABLET | Freq: Every day | ORAL | 3 refills | Status: DC

## 2023-05-14 NOTE — Patient Instructions (Addendum)
You may have some irritation the nerves form prior IV - have them avoid that area next time. If pain does not resolve in next 6 weeks - return to recheck. Sooner if worse.  You may have some arthritis of the thumb joint. Topical creams like biofreeze ok or tylenol.   Plavix may be cause of the bruising but I will check some blood tests.  No med changes today. Take care!  Return to the clinic or go to the nearest emergency room if any of your symptoms worsen or new symptoms occur.

## 2023-05-14 NOTE — Progress Notes (Signed)
Subjective:  Patient ID: Brittany [name], female    DOB: [birth year]  Age: 62 y.o. MRN: [id number]  CC:  Chief Complaint  Patient presents with   Medical Management of Chronic Issues    6 month f/u. Pt also states she's been getting a lot of bruises she's been noticing. Pt states when she cataract surgery about a month ago, she noticed a lump on her left hand that sometimes burn.     HPI AVIANA ZIERKE presents for  Multiple concerns above in addition to her chronic medication follow-ups.  Easy bruising: Active with grandkids. Bruising on legs, stomach - past month or so.  No new gum bleeding, some in past with dental issues. No hematuria, melena/hematochezia or vaginal bleeding.  Plavix 75 mg daily with history of CVA.  Lump on left hand IV in left hand each time for multiple eye procedures. Sore in that area, burning in that area at times.   Hyperlipidemia: Crestor 20 mg daily.  Goal LDL under 70 with history of CVA.  Well-controlled in March. No new myalgias or side effects.  Lab Results  Component Value Date   CHOL 99 11/11/2022   HDL 44.10 11/11/2022   LDLCALC 34 11/11/2022   LDLDIRECT 61 12/03/2013   TRIG 102.0 11/11/2022   CHOLHDL 2 11/11/2022   Lab Results  Component Value Date   ALT 14 11/11/2022   AST 13 11/11/2022   ALKPHOS 107 11/11/2022   BILITOT 0.3 11/11/2022   Depression: Treated with sertraline 200 mg daily.  Lorazepam if needed.  Family stressors discussed at her last visit including the passing of her son's girlfriend last year with sepsis.  She is helping indicate care of 16 children.  Felt like depression was stable at that time with her current med regimen. Still helping with children. Feels like depression is controlled without new side effects.      05/14/2023    1:55 PM 11/11/2022    1:49 PM 06/06/2022   11:12 AM 04/08/2022    1:28 PM 10/08/2021    1:25 PM  Depression screen PHQ 2/9  Decreased Interest 0 0 0 0 0  Down, Depressed, Hopeless 0 0 1  0 0  PHQ - 2 Score 0 0 1 0 0  Altered sleeping  0 1 2 1   Tired, decreased energy  0 0 1 1  Change in appetite  0 0 1 1  Feeling bad or failure about yourself   0 1 0 0  Trouble concentrating  0 0 0 0  Moving slowly or fidgety/restless  1 0 0 1  Suicidal thoughts  0 0 0 0  PHQ-9 Score  1 3 4 4       Diabetes: With history of CVA.  On insulin.  Followed by endocrinology.  Lab Results  Component Value Date   HGBA1C 6.7 (H) 11/11/2022   HGBA1C 9.7 (A) 04/02/2022   HGBA1C 7.9 (A) 12/04/2021   Lab Results  Component Value Date   MICROALBUR 1.2 11/11/2022   LDLCALC 34 11/11/2022   CREATININE 0.68 11/11/2022   Hypertension: With history of CVA, left MCA stroke.  On Keppra, followed by neurology also primidone for tremors.  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  Irbesartan 150 mg daily,  Home readings: none.  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 12/12/2017   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 cerebrovascular accident (CVA) (HCC) 12/12/2017   Acute right hemiparesis (HCC) 12/12/2017   History of completed stroke 12/12/2017   Leukocytosis 12/12/2017   Hypokalemia 12/12/2017   Hypotension    Middle cerebral artery embolism, left 12/06/2017   Poor compliance with CPAP treatment 03/04/2017   OSA and COPD overlap syndrome (HCC) 03/04/2017   Simple chronic bronchitis (HCC)  03/04/2017   MCI (mild cognitive impairment) 11/27/2016   Left sided lacunar infarction (HCC) 03/11/2014   Cerebrovascular small vessel disease 03/11/2014   Insomnia, idiopathic 12/20/2013   Snoring 12/06/2013   Essential hypertension, benign 10/26/2012   Hyperlipidemia LDL goal <70 10/26/2012   Polypharmacy 10/26/2012   DM (diabetes mellitus) (HCC) 10/13/2011   Tremor 10/13/2011   GERD (gastroesophageal reflux disease) 10/13/2011   Depression 10/13/2011   Past Medical History:  Diagnosis Date   Asthma    Cataract    [employer] 10/03/2020   Chronic lower back pain    Depression    Depression    [employer] 10/03/2020   Diabetes mellitus without complication (HCC)    [employer] 10/03/2020   Family history of adverse reaction to anesthesia    Mother has trouble coming out of anesthesia; patient reports she has no problems   Fibromyalgia    High cholesterol    Hypertension    [employer] 10/03/2020   Presence of permanent cardiac pacemaker    Seizures (HCC) 11/2017   "day after she came home from hospital after having stroke" (01/01/2018)   Stroke (HCC) 12/06/2017   "speech issues since" (01/01/2018)   Type II diabetes mellitus [facility])    Past Surgical History:  Procedure Laterality Date   CHOLECYSTECTOMY     EYE SURGERY N/A    [employer] 10/03/2020   GAS/FLUID EXCHANGE Right 12/12/2019   Procedure: Gas/Fluid Exchange;  Surgeon: Carmela Rima, MD;  Location: [facility] OR;  Service: Ophthalmology;  Laterality: Right;   INSERT / REPLACE / REMOVE PACEMAKER  01/01/2018   IR CT HEAD LTD  12/06/2017   IR PERCUTANEOUS ART THROMBECTOMY/INFUSION INTRACRANIAL INC DIAG ANGIO  12/06/2017   LOOP RECORDER INSERTION N/A 12/12/2017   Procedure: LOOP RECORDER INSERTION;  Surgeon: Regan Lemming, MD;  Location: [facility] INVASIVE CV LAB;  Service: Cardiovascular;  Laterality: N/A;   LOOP RECORDER REMOVAL  01/01/2018   LOOP RECORDER REMOVAL N/A 01/01/2018   Procedure: LOOP RECORDER REMOVAL;  Surgeon: Regan Lemming, MD;  Location: [facility] INVASIVE CV LAB;  Service: Cardiovascular;  Laterality: N/A;   NECK SURGERY     PACEMAKER IMPLANT N/A 01/01/2018   Procedure: PACEMAKER IMPLANT;  Surgeon: Regan Lemming, MD;  Location: [facility] INVASIVE CV LAB;  Service: Cardiovascular;  Laterality: N/A;   PHOTOCOAGULATION Right 12/12/2019   Procedure: Photocoagulation;  Surgeon: Carmela Rima, MD;  Location: [facility] OR;  Service: Ophthalmology;  Laterality: Right;   RADIOLOGY WITH ANESTHESIA N/A 12/06/2017   Procedure: CODE STROKE;  Surgeon: Julieanne Cotton, MD;  Location: [facility] OR;  Service: Radiology;  Laterality: N/A;   SCLERAL BUCKLE WITH POSSIBLE 25 GAUGE  PARS PLANA VITRECTOMY Right 12/12/2019   Procedure: SCLERAL BUCKLE WITH  PARS PLANA VITRECTOMY RETINAL DETACHMENT REPAIR;  Surgeon: Carmela Rima, MD;  Location: [facility] OR;  Service: Ophthalmology;  Laterality: Right;   TEE WITHOUT CARDIOVERSION N/A 12/10/2017   Procedure: TRANSESOPHAGEAL ECHOCARDIOGRAM (TEE);  Surgeon: Jake Bathe, MD;  Location: [facility] ENDOSCOPY;  Service: Cardiovascular;  Laterality: N/A;   THROMBECTOMY FEMORAL ARTERY Right 12/07/2017   Procedure: THROMBECTOMY RIGHT FEMORAL ARTERY;  Surgeon: Maeola Harman, MD;  Location: [facility] OR;  Service: Vascular;  Laterality: Right;   TONSILLECTOMY     No Known Allergies Prior to Admission medications   Medication Sig Start Date End Date Taking? Authorizing Provider  ACCU-CHEK GUIDE test strip USE AS DIRECTED FOUR TIMES DAILY 09/18/21  Yes Romero Belling, MD  acetaminophen (TYLENOL) 500 MG tablet Take 1,000 mg by mouth every 6 (six) hours as needed for fever (pain).   Yes [provider]  B Complex-Biotin-FA (MULTI-B COMPLEX) CAPS Take one a day ,B complex  multivitamin over the counter or use prenatal vitamin. 08/28/16  Yes Dohmeier, Porfirio Mylar, MD  Blood Glucose Monitoring Suppl (ACCU-CHEK GUIDE ME) w/Device KIT 1 each by Does not apply route 4 (four) times daily. E11.9 05/22/20  Yes Romero Belling, MD   clopidogrel (PLAVIX) 75 MG tablet TAKE 1 TABLET(75 MG) BY MOUTH DAILY 11/11/22  Yes Shade Flood, MD  Continuous Blood Gluc Sensor (DEXCOM G7 SENSOR) MISC 1 Device by Does not apply route as directed. 04/02/22  Yes Shamleffer, Konrad Dolores, MD  Dulaglutide (TRULICITY) 4.5 MG/0.5ML SOPN Inject 4.5 mg as directed once a week. 09/26/22  Yes Shamleffer, Konrad Dolores, MD  Insulin Aspart FlexPen (NOVOLOG) 100 UNIT/ML INJECT UNDER THE SKIN AS DIRECTED; MAX DAILY 30 UNITS PER SCALE 05/09/23  Yes Shamleffer, Konrad Dolores, MD  insulin glargine (LANTUS SOLOSTAR) 100 UNIT/ML Solostar Pen ADMINISTER 20 UNITS UNDER THE SKIN DAILY(REPLACES LEVEMIR) 05/07/23  Yes Shamleffer, Konrad Dolores, MD  Insulin Pen Needle (BD PEN NEEDLE NANO 2ND GEN) 32G X 4 MM MISC USE FOUR TIMES DAILY AS DIRECTED 04/02/22  Yes Shamleffer, Konrad Dolores, MD  irbesartan (AVAPRO) 150 MG tablet TAKE 1 TABLET(150 MG) BY MOUTH DAILY 11/11/22  Yes Shade Flood, MD  Lancets [facility] DELICA PLUS [place]) MISC USE FOUR TIMES DAILY 07/10/21  Yes Romero Belling, MD  levETIRAcetam (KEPPRA) 500 MG tablet Take 1 tablet (500 mg total) by mouth 2 (two) times daily. 10/22/22  Yes McCue, Shanda Bumps, NP  LORazepam (ATIVAN) 0.5 MG tablet Take 0.5-1 tablets (0.25-0.5 mg total) by mouth 2 (two) times daily as needed for anxiety (anxiety and tremors (social situations)). 10/16/20  Yes McCue, Shanda Bumps, NP  primidone (MYSOLINE) 250 MG tablet Take 1 tablet (250 mg total) by mouth daily. TAKE 1 TABLET(250 MG) BY MOUTH DAILY 10/22/22  Yes McCue, Shanda Bumps, NP  rosuvastatin (CRESTOR) 20 MG tablet TAKE 1 TABLET(20 MG) BY MOUTH DAILY 07/02/22  Yes Shade Flood, MD  Semaglutide, 1 MG/DOSE, 4 MG/3ML SOPN Inject 1 mg as directed once a week. 09/27/22  Yes Shamleffer, Konrad Dolores, MD  sertraline (ZOLOFT) 100 MG tablet TAKE 2 TABLETS(200 MG) BY MOUTH AT BEDTIME 11/11/22  Yes Shade Flood, MD   Social History   Socioeconomic History   Marital status: Married     Spouse name: [name]   Number of children: 2   Years of education: 14   Highest education level: Not on file  Occupational History   Not on file  Tobacco Use   Smoking status:  Former    Current packs/day: 1.00    Average packs/day: 1 pack/day for 38.0 years (38.0 ttl pk-yrs)    Types: Cigarettes   Smokeless tobacco: Never   Tobacco comments:    Pt used chantix July till January, quit first of the year   Vaping Use   Vaping status: Never Used  Substance and Sexual Activity   Alcohol use: Yes    Comment: 01/01/2018 "nothing since stroke 11/2017"   Drug use: No   Sexual activity: Not Currently  Other Topics Concern   Not on file  Social History Narrative   Patient is married ([name]) and lives at home with her family   Patient has two children.   Patient works at [employer].   Patient has a college education.   Patient is right-handed.   Patient drinks 3 cups of coffee M-F.      Social Determinants of Health   Financial Resource Strain: Not on file  Food Insecurity: Not on file  Transportation Needs: Not on file  Physical Activity: Not on file  Stress: Not on file  Social Connections: Not on file  Intimate Partner Violence: Not on file    Review of Systems  Constitutional:  Negative for fatigue and unexpected weight change.  Respiratory:  Negative for chest tightness and shortness of breath.   Cardiovascular:  Negative for chest pain, palpitations and leg swelling.  Gastrointestinal:  Negative for abdominal pain and blood in stool.  Neurological:  Negative for dizziness, syncope, light-headedness and headaches.     Objective:   Vitals:   05/14/23 1350  BP: 126/78  Pulse: 80  Temp: (!) 100.7 F (38.2 C)  TempSrc: Temporal  SpO2: 97%  Weight: 172 lb 12.8 oz (78.4 kg)  Height: 5\' 3"  (1.6 m)     Physical Exam Vitals reviewed.  Constitutional:      Appearance: Normal appearance. She is well-developed.  HENT:     Head: Normocephalic and  atraumatic.  Eyes:     Conjunctiva/sclera: Conjunctivae normal.     Pupils: Pupils are equal, round, and reactive to light.  Neck:     Vascular: No carotid bruit.  Cardiovascular:     Rate and Rhythm: Normal rate and regular rhythm.     Heart sounds: Normal heart sounds.  Pulmonary:     Effort: Pulmonary effort is normal.     Breath sounds: Normal breath sounds.  Abdominal:     Palpations: Abdomen is soft. There is no pulsatile mass.     Tenderness: There is no abdominal tenderness.  Musculoskeletal:     Right lower leg: No edema.     Left lower leg: No edema.     Comments: Left hand, slight discomfort at the thumb [facility] joint, otherwise nontender, skin intact, no rash.  No bruising.  No wounds.  Skin:    General: Skin is warm and dry.     Comments: Few small rounded ecchymoses on the lateral abdomen, healing.  Neurological:     Mental Status: She is alert and oriented to person, place, and time.  Psychiatric:        Mood and Affect: Mood normal.        Behavior: Behavior normal.     Assessment & Plan:  MAGDALYN IPINA is a 63 y.o. female . Essential hypertension, benign History of CVA with residual deficit  -Stable, tolerating current med regimen, no changes, check labs.  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  No med changes for me at this time.  Hyperlipidemia LDL goal <70 - Plan: Comprehensive metabolic panel, Lipid panel  -Tolerating current dose statin, continue same  Easy bruising - Plan: CBC  -Likely related to use of Plavix, active with grandchildren at home.  No concerning history or other symptoms.  Check CBC.  RTC precautions if worsening.  Left hand pain  -Suspect component of osteoarthritis at the [facility] joint, and possible neuronal  irritation from recurrent IV at same area.  Recommend topical treatment for possible arthritis, and avoid IV use in same area.  RTC precautions.  No orders of the defined types were placed in this encounter.  Patient Instructions  You may have some irritation the nerves form prior IV - have them avoid that area next time. If pain does not resolve in next 6 weeks - return to recheck. Sooner if worse.  You may have some arthritis of the thumb joint. Topical creams like biofreeze ok or tylenol.   Plavix may be cause of the bruising but I will check some blood tests.  No med changes today. Take care!  Return to the clinic or go to the nearest emergency room if any of your symptoms worsen or new symptoms occur.      Signed,   Meredith Staggers, MD [facility], [facility] 05/14/23 2:26 PM

## 2023-05-15 LAB — CBC
HCT: 41 % (ref 36.0–46.0)
Hemoglobin: 13.7 g/dL (ref 12.0–15.0)
MCHC: 33.3 g/dL (ref 30.0–36.0)
MCV: 88 fl (ref 78.0–100.0)
Platelets: 234 10*3/uL (ref 150.0–400.0)
RBC: 4.66 Mil/uL (ref 3.87–5.11)
RDW: 12.6 % (ref 11.5–15.5)
WBC: 6.1 10*3/uL (ref 4.0–10.5)

## 2023-05-15 LAB — COMPREHENSIVE METABOLIC PANEL
ALT: 12 U/L (ref 0–35)
AST: 14 U/L (ref 0–37)
Albumin: 3.9 g/dL (ref 3.5–5.2)
Alkaline Phosphatase: 121 U/L — ABNORMAL HIGH (ref 39–117)
BUN: 12 mg/dL (ref 6–23)
CO2: 27 meq/L (ref 19–32)
Calcium: 9.2 mg/dL (ref 8.4–10.5)
Chloride: 103 meq/L (ref 96–112)
Creatinine, Ser: 0.64 mg/dL (ref 0.40–1.20)
GFR: 93.65 mL/min (ref >60.00)
Glucose, Bld: 209 mg/dL — ABNORMAL HIGH (ref 70–99)
Potassium: 3.7 meq/L (ref 3.5–5.1)
Sodium: 136 meq/L (ref 135–145)
Total Bilirubin: 0.3 mg/dL (ref 0.2–1.2)
Total Protein: 6.9 g/dL (ref 6.0–8.3)

## 2023-05-15 LAB — LIPID PANEL
Cholesterol: 100 mg/dL (ref 0–200)
HDL: 46.9 mg/dL (ref >39.00)
LDL Cholesterol: 31 mg/dL (ref 0–99)
NonHDL: 53.15
Total CHOL/HDL Ratio: 2
Triglycerides: 112 mg/dL (ref 0.0–149.0)
VLDL: 22.4 mg/dL (ref 0.0–40.0)

## 2023-05-19 DIAGNOSIS — H34811 Central retinal vein occlusion, right eye, with macular edema: Secondary | ICD-10-CM | POA: Diagnosis not present

## 2023-05-20 ENCOUNTER — Telehealth: Payer: Self-pay

## 2023-05-20 NOTE — Telephone Encounter (Signed)
-----   Message from Shade Flood sent at 05/20/2023  4:55 PM EDT ----- Call patient.  Blood sugar elevated at 209 but other electrolytes looked okay.  Mildly elevated alkaline phosphatase not concerning as it has been higher previously.  Blood counts were normal.  Cholesterol levels were normal.  Let me know if there are questions.

## 2023-05-20 NOTE — Telephone Encounter (Signed)
Pt is aware of lab results.

## 2023-05-23 ENCOUNTER — Ambulatory Visit: Payer: Medicare PPO | Admitting: Internal Medicine

## 2023-05-26 ENCOUNTER — Encounter: Payer: Self-pay | Admitting: Internal Medicine

## 2023-05-26 ENCOUNTER — Ambulatory Visit: Payer: Medicare PPO | Admitting: Internal Medicine

## 2023-05-26 VITALS — BP 118/72 | HR 72 | Ht 63.0 in | Wt 174.0 lb

## 2023-05-26 DIAGNOSIS — E1159 Type 2 diabetes mellitus with other circulatory complications: Secondary | ICD-10-CM | POA: Diagnosis not present

## 2023-05-26 DIAGNOSIS — E1165 Type 2 diabetes mellitus with hyperglycemia: Secondary | ICD-10-CM

## 2023-05-26 DIAGNOSIS — E1149 Type 2 diabetes mellitus with other diabetic neurological complication: Secondary | ICD-10-CM | POA: Diagnosis not present

## 2023-05-26 DIAGNOSIS — Z7985 Long-term (current) use of injectable non-insulin antidiabetic drugs: Secondary | ICD-10-CM | POA: Diagnosis not present

## 2023-05-26 DIAGNOSIS — Z794 Long term (current) use of insulin: Secondary | ICD-10-CM

## 2023-05-26 LAB — POCT GLYCOSYLATED HEMOGLOBIN (HGB A1C): Hemoglobin A1C: 9 % — AB (ref 4.0–5.6)

## 2023-05-26 MED ORDER — INSULIN ASPART FLEXPEN 100 UNIT/ML ~~LOC~~ SOPN
PEN_INJECTOR | SUBCUTANEOUS | 3 refills | Status: DC
Start: 2023-05-26 — End: 2023-10-27

## 2023-05-26 MED ORDER — LANTUS SOLOSTAR 100 UNIT/ML ~~LOC~~ SOPN
22.0000 [IU] | PEN_INJECTOR | Freq: Every day | SUBCUTANEOUS | 2 refills | Status: DC
Start: 2023-05-26 — End: 2023-09-25

## 2023-05-26 MED ORDER — BD PEN NEEDLE NANO 2ND GEN 32G X 4 MM MISC: 3 refills | Status: DC

## 2023-05-26 MED ORDER — TIRZEPATIDE 5 MG/0.5ML ~~LOC~~ SOAJ: 5.0000 mg | SUBCUTANEOUS | 3 refills | Status: DC

## 2023-05-26 NOTE — Patient Instructions (Addendum)
Switch Trulicity to [employer] 5 mg once weekly  Increase  Lantus  22 units ONCE daily  Change  Novolog 5 units with Breakfast, with Lunch and with Supper    Novolog correctional insulin: ADD extra units on insulin to your meal-time Novolog dose if your blood sugars are higher than 170. Use the scale below to help guide you:   Blood sugar before meal Number of units to inject  Less than 170 0 unit  171 -  210 1 units  211 -  250 2 units  251 -  290 3 units  291 -  330 4 units  331 -  370 5 units  371 -  410 6 units  411 -  450 7 units  451 -  490 8 units    HOW TO TREAT LOW BLOOD SUGARS (Blood sugar LESS THAN 70 MG/DL) Please follow the RULE OF 15 for the treatment of hypoglycemia treatment (when your (blood sugars are less than 70 mg/dL)   STEP 1: Take 15 grams of carbohydrates when your blood sugar is low, which includes:  3-4 GLUCOSE TABS  OR 3-4 OZ OF JUICE OR REGULAR SODA OR ONE TUBE OF GLUCOSE GEL    STEP 2: RECHECK blood sugar in 15 MINUTES STEP 3: If your blood sugar is still low at the 15 minute recheck --> then, go back to STEP 1 and treat AGAIN with another 15 grams of carbohydrates.

## 2023-05-26 NOTE — Progress Notes (Signed)
Name: Brittany [name]  Age/ Sex: 62 y.o., female   MRN/ DOB: [id number], 30-Sep-1960     PCP: Shade Flood, MD   Reason for Endocrinology Evaluation: Type 2 Diabetes Mellitus  Initial Endocrine Consultative Visit: 05/04/2018    PATIENT IDENTIFIER: Brittany [name] is a 62 y.o. female with a past medical history of T2DM, HTN, fibromyalgia and dyslipidemia. The patient has followed with Endocrinology clinic since 05/04/2018 for consultative assistance with management of her diabetes.  DIABETIC HISTORY:  Brittany [name] was diagnosed with DM 1999, and started insulin therapy in 2019. Her hemoglobin A1c has ranged from 6.6% in 2013, peaking at >14.0% in 2018.    Was last seen by Dr. Everardo All in March 2023  SUBJECTIVE:   During the last visit (11/20/2022): A1c 6.9%     Today (05/26/2023): Brittany [name] is here for a follow up on diabetes management.  She checks her blood sugars multiple  times daily, through CGM. 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  Denies constipation or diarrhea  Denies nausea or vomiting  Husband is a caregiver but he  is on dialysis    HOME DIABETES REGIMEN:  Trulicity 4.5 mg weekly Lantus 20 units daily  NovoLog 5/8/8 units with meals  correction scale: NovoLog (BG -130/40)    Statin: yes ACE-I/ARB: Yes    CONTINUOUS GLUCOSE MONITORING RECORD INTERPRETATION : Unable to download   Sensor description:dexcom G7  Results statistic   DIABETIC COMPLICATIONS: Microvascular complications:   Denies: CKD Last Eye Exam: Completed 10/2022  Macrovascular complications:  CVA Denies: CAD, PVD   HISTORY:  Past Medical History:  Past Medical History:  Diagnosis Date   Asthma    Cataract    Phreesia 10/03/2020   Chronic lower back pain    Depression    Depression    Phreesia 10/03/2020   Diabetes mellitus without complication  (HCC)    Phreesia 10/03/2020   Family history of adverse reaction to anesthesia    Mother has trouble coming out of anesthesia; patient reports she has no problems   Fibromyalgia    High cholesterol    Hypertension    Phreesia 10/03/2020   Presence of permanent cardiac pacemaker    Seizures (HCC) 11/2017   "day after she came home from hospital after having stroke" (01/01/2018)   Stroke (HCC) 12/06/2017   "speech issues since" (01/01/2018)   Type II diabetes mellitus (HCC)    Past Surgical History:  Past Surgical History:  Procedure Laterality Date   CHOLECYSTECTOMY     EYE SURGERY N/A    Phreesia 10/03/2020   GAS/FLUID EXCHANGE Right 12/12/2019   Procedure: Gas/Fluid Exchange;  Surgeon: Carmela Rima, MD;  Location: [facility] OR;  Service: Ophthalmology;  Laterality: Right;   INSERT / REPLACE / REMOVE PACEMAKER  01/01/2018   IR CT HEAD LTD  12/06/2017   IR PERCUTANEOUS ART THROMBECTOMY/INFUSION INTRACRANIAL INC DIAG ANGIO  12/06/2017   LOOP RECORDER INSERTION N/A 12/12/2017   Procedure: LOOP RECORDER INSERTION;  Surgeon: Regan Lemming, MD;  Location: [facility] INVASIVE CV LAB;  Service: Cardiovascular;  Laterality: N/A;   LOOP RECORDER REMOVAL  01/01/2018   LOOP RECORDER REMOVAL N/A 01/01/2018   Procedure: LOOP RECORDER REMOVAL;  Surgeon: Regan Lemming, MD;  Location: [facility] INVASIVE CV LAB;  Service: Cardiovascular;  Laterality: N/A;   NECK SURGERY     PACEMAKER  IMPLANT N/A 01/01/2018   Procedure: PACEMAKER IMPLANT;  Surgeon: Regan Lemming, MD;  Location: [facility] INVASIVE CV LAB;  Service: Cardiovascular;  Laterality: N/A;   PHOTOCOAGULATION Right 12/12/2019   Procedure: Photocoagulation;  Surgeon: Carmela Rima, MD;  Location: [facility] OR;  Service: Ophthalmology;  Laterality: Right;   RADIOLOGY WITH ANESTHESIA N/A 12/06/2017   Procedure: CODE STROKE;  Surgeon: Julieanne Cotton, MD;  Location: [facility] OR;  Service: Radiology;  Laterality: N/A;   SCLERAL BUCKLE WITH POSSIBLE 25 GAUGE PARS PLANA  VITRECTOMY Right 12/12/2019   Procedure: SCLERAL BUCKLE WITH  PARS PLANA VITRECTOMY RETINAL DETACHMENT REPAIR;  Surgeon: Carmela Rima, MD;  Location: [facility] OR;  Service: Ophthalmology;  Laterality: Right;   TEE WITHOUT CARDIOVERSION N/A 12/10/2017   Procedure: TRANSESOPHAGEAL ECHOCARDIOGRAM (TEE);  Surgeon: Jake Bathe, MD;  Location: [facility] ENDOSCOPY;  Service: Cardiovascular;  Laterality: N/A;   THROMBECTOMY FEMORAL ARTERY Right 12/07/2017   Procedure: THROMBECTOMY RIGHT FEMORAL ARTERY;  Surgeon: Maeola Harman, MD;  Location: [facility] OR;  Service: Vascular;  Laterality: Right;   TONSILLECTOMY     Social History:  reports that she has quit smoking. Her smoking use included cigarettes. She has a 38 pack-year smoking history. She has never used smokeless tobacco. She reports current alcohol use. She reports that she does not use drugs. Family History:  Family History  Problem Relation Age of Onset   Cancer Mother    Diabetes Mother    Heart disease Mother    Parkinsonism Mother    Diabetes Father    Heart disease Father    Dementia Father    COPD Maternal Aunt    Cancer Maternal Grandmother 41       lung   Breast cancer Neg Hx      HOME MEDICATIONS: Allergies as of 05/26/2023   No Known Allergies      Medication List        Accurate as of May 26, 2023  3:27 PM. If you have any questions, ask your nurse or doctor.          STOP taking these medications    Semaglutide (1 MG/DOSE) 4 MG/3ML Sopn Stopped by: Johnney Ou Alexismarie Flaim   Trulicity 4.5 MG/0.5ML Sopn Generic drug: Dulaglutide Stopped by: Johnney Ou Laci Frenkel       TAKE these medications    Accu-Chek Guide Me w/Device Kit 1 each by Does not apply route 4 (four) times daily. E11.9   Accu-Chek Guide test strip Generic drug: glucose blood USE AS DIRECTED FOUR TIMES DAILY   acetaminophen 500 MG tablet Commonly known as: TYLENOL Take 1,000 mg by mouth every 6 (six) hours as needed for fever (pain).    Alphagan P 0.1 % Soln Generic drug: brimonidine Apply to eye.   brimonidine 0.15 % ophthalmic solution Commonly known as: ALPHAGAN SMARTSIG:In Eye(s)   Alphagan P 0.1 % Soln Generic drug: brimonidine Apply to eye.   BD Pen Needle Nano 2nd Gen 32G X 4 MM Misc Generic drug: Insulin Pen Needle USE FOUR TIMES DAILY AS DIRECTED   clopidogrel 75 MG tablet Commonly known as: PLAVIX TAKE 1 TABLET(75 MG) BY MOUTH DAILY   Dexcom G7 Sensor Misc 1 Device by Does not apply route as directed.   Insulin Aspart FlexPen 100 UNIT/ML Commonly known as: NOVOLOG INJECT UNDER THE SKIN AS DIRECTED; MAX DAILY 30 UNITS PER SCALE   irbesartan 150 MG tablet Commonly known as: AVAPRO TAKE 1 TABLET(150 MG) BY MOUTH DAILY   Lantus SoloStar 100 UNIT/ML Solostar  Pen Generic drug: insulin glargine Inject 22 Units into the skin daily. What changed: See the new instructions. Changed by: Johnney Ou Devi Hopman   levETIRAcetam 500 MG tablet Commonly known as: KEPPRA Take 1 tablet (500 mg total) by mouth 2 (two) times daily.   LORazepam 0.5 MG tablet Commonly known as: Ativan Take 0.5-1 tablets (0.25-0.5 mg total) by mouth 2 (two) times daily as needed for anxiety (anxiety and tremors (social situations)).   Multi-B Complex Caps Take one a day ,B complex  multivitamin over the counter or use prenatal vitamin.   ofloxacin 0.3 % ophthalmic solution Commonly known as: OCUFLOX   OneTouch Delica Plus Lancet33G Misc USE FOUR TIMES DAILY   Prednisol Ace-Moxiflox-Bromfen 1-0.5-0.075 % Susp Place 1 drop into the left eye 4 (four) times daily.   prednisoLONE acetate 1 % ophthalmic suspension Commonly known as: PRED FORTE SMARTSIG:In Eye(s)   primidone 250 MG tablet Commonly known as: MYSOLINE Take 1 tablet (250 mg total) by mouth daily. TAKE 1 TABLET(250 MG) BY MOUTH DAILY   rosuvastatin 20 MG tablet Commonly known as: CRESTOR Take 1 tablet (20 mg total) by mouth daily.   sertraline 100 MG  tablet Commonly known as: ZOLOFT TAKE 2 TABLETS(200 MG) BY MOUTH AT BEDTIME   timolol 0.5 % ophthalmic solution Commonly known as: TIMOPTIC SMARTSIG:In Eye(s)   tirzepatide 5 MG/0.5ML Pen Commonly known as: MOUNJARO Inject 5 mg into the skin once a week. Started by: Johnney Ou Siah Steely         OBJECTIVE:   Vital Signs: BP 118/72 (BP Location: Left Arm, Patient Position: Sitting, Cuff Size: Small)   Pulse 72   Ht 5\' 3"  (1.6 m)   Wt 174 lb (78.9 kg)   SpO2 97%   BMI 30.82 kg/m   Wt Readings from Last 3 Encounters:  05/26/23 174 lb (78.9 kg)  05/14/23 172 lb 12.8 oz (78.4 kg)  11/20/22 162 lb (73.5 kg)     Exam: General: Pt appears well and is in NAD  Lungs: Clear with good BS bilat   Heart: RRR   Extremities: No pretibial edema.   Neuro: MS is good with appropriate affect, pt is alert and Ox3    DM foot exam: 05/26/2023  The skin of the feet is intact without sores or ulcerations. The pedal pulses are 2+ on right and 2+ on left. The sensation is intact to a screening 5.07, 10 gram monofilament bilaterally        DATA REVIEWED:  Lab Results  Component Value Date   HGBA1C 9.0 (A) 05/26/2023   HGBA1C 6.7 (H) 11/11/2022   HGBA1C 9.7 (A) 04/02/2022    Latest Reference Range & Units 05/14/23 14:41  Sodium 135 - 145 mEq/L 136  Potassium 3.5 - 5.1 mEq/L 3.7  Chloride 96 - 112 mEq/L 103  CO2 19 - 32 mEq/L 27  Glucose 70 - 99 mg/dL 811 (H)  BUN 6 - 23 mg/dL 12  Creatinine 9.14 - 7.82 mg/dL 9.56  Calcium 8.4 - 21.3 mg/dL 9.2  Alkaline Phosphatase 39 - 117 U/L 121 (H)  Albumin 3.5 - 5.2 g/dL 3.9  AST 0 - 37 U/L 14  ALT 0 - 35 U/L 12  Total Protein 6.0 - 8.3 g/dL 6.9  Total Bilirubin 0.2 - 1.2 mg/dL 0.3  GFR >08.65 mL/min 93.65    Latest Reference Range & Units 05/14/23 14:41  Total CHOL/HDL Ratio  2  Cholesterol 0 - 200 mg/dL 784  HDL Cholesterol >69.62 mg/dL 95.28  LDL (calc) 0 - 99 mg/dL 31  NonHDL  96.29  Triglycerides 0.0 - 149.0 mg/dL 528.4   VLDL 0.0 - 13.2 mg/dL 44.0    Old records , labs and images have been reviewed.     ASSESSMENT / PLAN / RECOMMENDATIONS:   1) Type 2 Diabetes Mellitus, Poorly  controlled, With Macrovascular complications - Most recent A1c of 9.0 %. Goal A1c < 7.0 %.    -A1c has trended up again, this is due to lack of prandial insulin -We briefly discussed SGLT2 inhibitors, we will consider this in the future if needed  -Barriers to diabetes self-care is history of CVA and memory issues as she is not consistently taking her prandial insulin -I have suggested switching Trulicity to [facility] as below -I will also increase her basal insulin as below -In manual reviewing her CGM app, patient has been noted with persistent hyperglycemia overnight, worsening postprandial hyperglycemia followed by hypoglycemia after a bolus -I will also decrease her prandial insulin to a standing dose to decrease confusion with different dosing for different meals  MEDICATIONS: Switch Trulicity 4.5 mg weekly to Mounjaro 5 mg weekly Increase Lantus 22 units daily Change  NovoLog 5 units  TIDQAC Continue correction scale: NovoLog (BG -130/40)  EDUCATION / INSTRUCTIONS: BG monitoring instructions: Patient is instructed to check her blood sugars 3 times a day, before each meal. Call  Endocrinology clinic if: BG persistently < 70  I reviewed the Rule of 15 for the treatment of hypoglycemia in detail with the patient. Literature supplied.Marland Kitchen   2) Diabetic complications:  Eye: Does not have known diabetic retinopathy.  Neuro/ Feet: Does not have known diabetic peripheral neuropathy .  Renal: Patient does not have known baseline CKD. She   is  on an ACEI/ARB at present.    F/U in 4 months   Signed electronically by: Lyndle Herrlich, MD  [facility] Endocrinology  [facility] [address] [place], [place] [address] Phone: [phone] FAX: [phone]   CC: Shade Flood,  MD [address] [place] [address] Phone: [phone]  Fax: [phone]  Return to Endocrinology clinic as below: Future Appointments  Date Time Provider Department Center  07/01/2023  7:50 AM [facility] DEVICE REMOTES [facility]  09/30/2023  7:50 AM [facility] DEVICE REMOTES [facility]  10/01/2023 11:10 AM Colisha Redler, Konrad Dolores, MD [facility] None  10/22/2023  2:45 PM Ihor Austin, NP [facility] None  11/12/2023  1:40 PM Shade Flood, MD [facility]  12/30/2023  7:50 AM [facility] DEVICE REMOTES [facility]  03/30/2024  7:50 AM [facility] DEVICE REMOTES [facility]  06/29/2024  7:50 AM [facility] DEVICE REMOTES [facility]

## 2023-06-18 DIAGNOSIS — H34811 Central retinal vein occlusion, right eye, with macular edema: Secondary | ICD-10-CM | POA: Diagnosis not present

## 2023-07-01 ENCOUNTER — Ambulatory Visit (INDEPENDENT_AMBULATORY_CARE_PROVIDER_SITE_OTHER): Payer: Medicare PPO

## 2023-07-01 DIAGNOSIS — I442 Atrioventricular block, complete: Secondary | ICD-10-CM | POA: Diagnosis not present

## 2023-07-03 LAB — CUP PACEART REMOTE DEVICE CHECK
Battery Remaining Longevity: 117 mo
Battery Voltage: 3 V
Brady Statistic AP VP Percent: 1.57 %
Brady Statistic AP VS Percent: 0.72 %
Brady Statistic AS VP Percent: 9.9 %
Brady Statistic AS VS Percent: 87.81 %
Brady Statistic RA Percent Paced: 2.29 %
Brady Statistic RV Percent Paced: 11.48 %
Date Time Interrogation Session: 20241022173004
Implantable Lead Connection Status: 753985
Implantable Lead Connection Status: 753985
Implantable Lead Implant Date: 20190425
Implantable Lead Implant Date: 20190425
Implantable Lead Location: 753859
Implantable Lead Location: 753860
Implantable Lead Model: 5076
Implantable Lead Model: 5076
Implantable Pulse Generator Implant Date: 20190425
Lead Channel Impedance Value: 342 Ohm
Lead Channel Impedance Value: 380 Ohm
Lead Channel Impedance Value: 380 Ohm
Lead Channel Impedance Value: 456 Ohm
Lead Channel Pacing Threshold Amplitude: 0.5 V
Lead Channel Pacing Threshold Amplitude: 0.75 V
Lead Channel Pacing Threshold Pulse Width: 0.4 ms
Lead Channel Pacing Threshold Pulse Width: 0.4 ms
Lead Channel Sensing Intrinsic Amplitude: 11.25 mV
Lead Channel Sensing Intrinsic Amplitude: 11.25 mV
Lead Channel Sensing Intrinsic Amplitude: 2.375 mV
Lead Channel Sensing Intrinsic Amplitude: 2.375 mV
Lead Channel Setting Pacing Amplitude: 1.5 V
Lead Channel Setting Pacing Amplitude: 2.5 V
Lead Channel Setting Pacing Pulse Width: 0.4 ms
Lead Channel Setting Sensing Sensitivity: 2 mV
Zone Setting Status: 755011
Zone Setting Status: 755011

## 2023-07-13 ENCOUNTER — Other Ambulatory Visit: Payer: Self-pay | Admitting: Family Medicine

## 2023-07-13 DIAGNOSIS — E785 Hyperlipidemia, unspecified: Secondary | ICD-10-CM

## 2023-07-21 NOTE — Progress Notes (Signed)
Remote pacemaker transmission.   

## 2023-07-22 DIAGNOSIS — E1165 Type 2 diabetes mellitus with hyperglycemia: Secondary | ICD-10-CM | POA: Diagnosis not present

## 2023-08-13 DIAGNOSIS — H34811 Central retinal vein occlusion, right eye, with macular edema: Secondary | ICD-10-CM | POA: Diagnosis not present

## 2023-08-13 DIAGNOSIS — H35362 Drusen (degenerative) of macula, left eye: Secondary | ICD-10-CM | POA: Diagnosis not present

## 2023-08-13 DIAGNOSIS — H353221 Exudative age-related macular degeneration, left eye, with active choroidal neovascularization: Secondary | ICD-10-CM | POA: Diagnosis not present

## 2023-08-15 DIAGNOSIS — H353221 Exudative age-related macular degeneration, left eye, with active choroidal neovascularization: Secondary | ICD-10-CM | POA: Diagnosis not present

## 2023-09-11 ENCOUNTER — Emergency Department (HOSPITAL_COMMUNITY)
Admission: EM | Admit: 2023-09-11 | Discharge: 2023-09-12 | Disposition: A | Discharge status: Home / Self Care | Payer: Medicare PPO | Attending: Emergency Medicine | Admitting: Emergency Medicine

## 2023-09-11 ENCOUNTER — Other Ambulatory Visit: Payer: Self-pay

## 2023-09-11 DIAGNOSIS — I10 Essential (primary) hypertension: Secondary | ICD-10-CM | POA: Insufficient documentation

## 2023-09-11 DIAGNOSIS — Z794 Long term (current) use of insulin: Secondary | ICD-10-CM | POA: Diagnosis not present

## 2023-09-11 DIAGNOSIS — Z79899 Other long term (current) drug therapy: Secondary | ICD-10-CM | POA: Insufficient documentation

## 2023-09-11 DIAGNOSIS — Z8673 Personal history of transient ischemic attack (TIA), and cerebral infarction without residual deficits: Secondary | ICD-10-CM | POA: Insufficient documentation

## 2023-09-11 DIAGNOSIS — T161XXA Foreign body in right ear, initial encounter: Secondary | ICD-10-CM | POA: Insufficient documentation

## 2023-09-11 DIAGNOSIS — E119 Type 2 diabetes mellitus without complications: Secondary | ICD-10-CM | POA: Insufficient documentation

## 2023-09-11 DIAGNOSIS — W448XXA Other foreign body entering into or through a natural orifice, initial encounter: Secondary | ICD-10-CM | POA: Diagnosis not present

## 2023-09-11 DIAGNOSIS — S0991XA Unspecified injury of ear, initial encounter: Secondary | ICD-10-CM

## 2023-09-11 DIAGNOSIS — Z7902 Long term (current) use of antithrombotics/antiplatelets: Secondary | ICD-10-CM | POA: Diagnosis not present

## 2023-09-11 DIAGNOSIS — J45909 Unspecified asthma, uncomplicated: Secondary | ICD-10-CM | POA: Insufficient documentation

## 2023-09-11 NOTE — ED Triage Notes (Signed)
 Pt states was putting hearing aid in the tip of the end of the hearing aid got in in right ear. Pt attempted to get out with no success. Blood seen in right ear. Unable to see piece. Pt states feels like it is in ear

## 2023-09-12 MED ORDER — OFLOXACIN 0.3 % OT SOLN: 5.0000 [drp] | Freq: Two times a day (BID) | OTIC | 0 refills | Status: DC

## 2023-09-12 NOTE — ED Provider Notes (Signed)
 [place] EMERGENCY DEPARTMENT AT [facility] Provider Note   CSN: [id number] Arrival date & time: 09/11/23  2107     History  Chief Complaint  Patient presents with   Foreign Body in Ear    Brittany [name] is a 63 y.o. female.  With history of seizure disorder, previous stroke, diabetes, anxiety, depression, hypertension, asthma, fibromyalgia presenting to the ED for evaluation of foreign object in her right ear.  She reports she recently bought some hearing aids.  Earlier this afternoon she placed a hearing aid in her right ear, but attempted to take it out and noticed that the tip of the hearing aid was not attached.  She reports some pain to the right ear canal.  She denies any changes in her hearing.  She denies any left ear pain.   Foreign Body in Ear       Home Medications Prior to Admission medications   Medication Sig Start Date End Date Taking? Authorizing Provider  ofloxacin  (FLOXIN ) 0.3 % OTIC solution Place 5 drops into the right ear 2 (two) times daily. 09/12/23  Yes Altin Sease, Marsa HERO, PA-C  ACCU-CHEK GUIDE test strip USE AS DIRECTED FOUR TIMES DAILY 09/18/21   Kassie Mallick, MD  acetaminophen  (TYLENOL ) 500 MG tablet Take 1,000 mg by mouth every 6 (six) hours as needed for fever (pain).    [provider]  ALPHAGAN P 0.1 % SOLN Apply to eye. 04/15/23   [provider]  ALPHAGAN P 0.1 % SOLN Apply to eye. 12/18/22   [provider]  B Complex-Biotin-FA (MULTI-B COMPLEX) CAPS Take one a day ,B complex  multivitamin over the counter or use prenatal vitamin. 08/28/16   Dohmeier, Dedra, MD  Blood Glucose Monitoring Suppl (ACCU-CHEK GUIDE ME) w/Device KIT 1 each by Does not apply route 4 (four) times daily. E11.9 05/22/20   Kassie Mallick, MD  brimonidine (ALPHAGAN) 0.15 % ophthalmic solution SMARTSIG:In Eye(s) 01/02/23   [provider]  clopidogrel  (PLAVIX ) 75 MG tablet TAKE 1 TABLET(75 MG) BY MOUTH DAILY 11/11/22   Levora Reyes SAUNDERS,  MD  Continuous Blood Gluc Sensor (DEXCOM G7 SENSOR) MISC 1 Device by Does not apply route as directed. 04/02/22   Shamleffer, Ibtehal Jaralla, MD  Insulin  Aspart FlexPen (NOVOLOG ) 100 UNIT/ML INJECT UNDER THE SKIN AS DIRECTED; MAX DAILY 30 UNITS PER SCALE 05/26/23   Shamleffer, Ibtehal Jaralla, MD  insulin  glargine (LANTUS  SOLOSTAR) 100 UNIT/ML Solostar Pen Inject 22 Units into the skin daily. 05/26/23   Shamleffer, Ibtehal Jaralla, MD  Insulin  Pen Needle (BD PEN NEEDLE NANO 2ND GEN) 32G X 4 MM MISC USE FOUR TIMES DAILY AS DIRECTED 05/26/23   Shamleffer, Ibtehal Jaralla, MD  irbesartan  (AVAPRO ) 150 MG tablet TAKE 1 TABLET(150 MG) BY MOUTH DAILY 11/11/22   Levora Reyes SAUNDERS, MD  Lancets [facility] DELICA PLUS [place]) MISC USE FOUR TIMES DAILY 07/10/21   Kassie Mallick, MD  levETIRAcetam  (KEPPRA ) 500 MG tablet Take 1 tablet (500 mg total) by mouth 2 (two) times daily. 10/22/22   Whitfield Raisin, NP  LORazepam  (ATIVAN ) 0.5 MG tablet Take 0.5-1 tablets (0.25-0.5 mg total) by mouth 2 (two) times daily as needed for anxiety (anxiety and tremors (social situations)). 10/16/20   Whitfield Raisin, NP  ofloxacin  (OCUFLOX ) 0.3 % ophthalmic solution  03/11/23   [provider]  Prednisol Ace-Moxiflox-Bromfen 1-0.5-0.075 % SUSP Place 1 drop into the left eye 4 (four) times daily. 04/01/23   [provider]  prednisoLONE acetate (PRED FORTE) 1 %  ophthalmic suspension SMARTSIG:In Eye(s) 01/15/23   [provider]  primidone  (MYSOLINE ) 250 MG tablet Take 1 tablet (250 mg total) by mouth daily. TAKE 1 TABLET(250 MG) BY MOUTH DAILY 10/22/22   Whitfield Raisin, NP  rosuvastatin  (CRESTOR ) 20 MG tablet TAKE 1 TABLET(20 MG) BY MOUTH DAILY 07/14/23   Levora Reyes SAUNDERS, MD  sertraline  (ZOLOFT ) 100 MG tablet TAKE 2 TABLETS(200 MG) BY MOUTH AT BEDTIME 05/14/23   Levora Reyes SAUNDERS, MD  timolol (TIMOPTIC) 0.5 % ophthalmic solution SMARTSIG:In Eye(s) 01/01/23   [provider]  tirzepatide  (MOUNJARO ) 5 MG/0.5ML Pen  Inject 5 mg into the skin once a week. 05/26/23   Shamleffer, Ibtehal Jaralla, MD      Allergies    Patient has no known allergies.    Review of Systems   Review of Systems  HENT:  Positive for ear pain.   All other systems reviewed and are negative.   Physical Exam Updated Vital Signs BP (!) 162/75 (BP Location: Right Arm)   Pulse 78   Temp 98.3 F (36.8 C) (Oral)   Resp 18   SpO2 97%  Physical Exam Vitals and nursing note reviewed.  Constitutional:      General: She is not in acute distress.    Appearance: Normal appearance. She is normal weight. She is not ill-appearing.  HENT:     Head: Normocephalic and atraumatic.     Ears:     Comments: Left TM and canal within normal limits.  Right TM normal.  Right canal with small amount of blood, clear plastic foreign body appreciated Pulmonary:     Effort: Pulmonary effort is normal. No respiratory distress.  Abdominal:     General: Abdomen is flat.  Musculoskeletal:        General: Normal range of motion.     Cervical back: Neck supple.  Skin:    General: Skin is warm and dry.  Neurological:     Mental Status: She is alert and oriented to person, place, and time.  Psychiatric:        Mood and Affect: Mood normal.        Behavior: Behavior normal.     ED Results / Procedures / Treatments   Labs (all labs ordered are listed, but only abnormal results are displayed) Labs Reviewed - No data to display  EKG None  Radiology No results found.  Procedures Procedures    Medications Ordered in ED Medications - No data to display  ED Course/ Medical Decision Making/ A&P                                 Medical Decision Making This patient presents to the ED for concern of foreign body in the ear, this involves an extensive number of treatment options, and is a complaint that carries with it a high risk of complications and morbidity.  The differential diagnosis includes retained foreign body, otitis externa, otitis  media, TM rupture, ear canal trauma  My initial workup includes flushing  Additional history obtained from: Nursing notes from this visit.  Afebrile, hypertensive but otherwise hemodynamically stable.  63 year old female presenting for evaluation of right ear pain.  She recently obtained micro hearing aids and used them for the first time today.  When she took the hearing aid out of her right ear she felt like the tip of the hearing aid got stuck in her ear canal.  She denies  any changes to her hearing.  She reports mild pain to the right ear.  No abnormalities of the left ear.  She attempted to get the hearing aid out without success at home.  Small mount of blood was appreciated on initial physical exam.  Ear canal was then flushed and on reassessment, it was noted that patient has a small hematoma to the ear canal in the 6 o'clock position.  No foreign body was appreciated.  No active bleeding.  Suspect ear trauma was the cause of her symptoms.  Given traumatic injury of the ear canal, will initiate antibiotic drops.  She was encouraged to follow-up with ENT and given contact information.  She was given return precautions.  Stable at discharge.  At this time there does not appear to be any evidence of an acute emergency medical condition and the patient appears stable for discharge with appropriate outpatient follow up. Diagnosis was discussed with patient who verbalizes understanding of care plan and is agreeable to discharge. I have discussed return precautions with patient and significant other who verbalizes understanding. Patient encouraged to follow-up with ENT. All questions answered.  Note: Portions of this report may have been transcribed using voice recognition software. Every effort was made to ensure accuracy; however, inadvertent computerized transcription errors may still be present.         Final Clinical Impression(s) / ED Diagnoses Final diagnoses:  Trauma of ear canal,  initial encounter    Rx / DC Orders ED Discharge Orders          Ordered    ofloxacin  (FLOXIN ) 0.3 % OTIC solution  2 times daily        09/12/23 0320              Edwardo Marsa HERO, PA-C 09/12/23 0322    Griselda Norris, MD 09/12/23 [phone]

## 2023-09-12 NOTE — Discharge Instructions (Signed)
 You have been seen today for your complaint of right ear pain.  On exam, there does appear to be a hematoma which is a small bruise to the right ear canal.  No foreign body was appreciated. Your discharge medications include ofloxacin .  This is an antibiotic drop.  Place 5 drops in your right ear twice daily for the next 7 days. Home care instructions are as follows:  Do not place anything in your right ear including water  Follow up with: [facility] ear, nose and throat.  Call to schedule a follow-up appointment Please seek immediate medical care if you develop any of the following symptoms: Any new or worsening symptoms, loss of hearing At this time there does not appear to be the presence of an emergent medical condition, however there is always the potential for conditions to change. Please read and follow the below instructions.  Do not take your medicine if  develop an itchy rash, swelling in your mouth or lips, or difficulty breathing; call 911 and seek immediate emergency medical attention if this occurs.  You may review your lab tests and imaging results in their entirety on your MyChart account.  Please discuss all results of fully with your primary care provider and other specialist at your follow-up visit.  Note: Portions of this text may have been transcribed using voice recognition software. Every effort was made to ensure accuracy; however, inadvertent computerized transcription errors may still be present.

## 2023-09-24 DIAGNOSIS — H353221 Exudative age-related macular degeneration, left eye, with active choroidal neovascularization: Secondary | ICD-10-CM | POA: Diagnosis not present

## 2023-09-25 ENCOUNTER — Telehealth: Payer: Self-pay | Admitting: *Deleted

## 2023-09-25 ENCOUNTER — Other Ambulatory Visit: Payer: Self-pay

## 2023-09-25 DIAGNOSIS — Z794 Long term (current) use of insulin: Secondary | ICD-10-CM

## 2023-09-25 MED ORDER — LEVETIRACETAM 500 MG PO TABS: 500.0000 mg | ORAL_TABLET | Freq: Two times a day (BID) | ORAL | 0 refills | Status: DC

## 2023-09-25 MED ORDER — PRIMIDONE 250 MG PO TABS: 250.0000 mg | ORAL_TABLET | Freq: Every day | ORAL | 0 refills | Status: DC

## 2023-09-25 MED ORDER — LANTUS SOLOSTAR 100 UNIT/ML ~~LOC~~ SOPN: 22.0000 [IU] | PEN_INJECTOR | Freq: Every day | SUBCUTANEOUS | 2 refills | Status: DC

## 2023-09-25 NOTE — Telephone Encounter (Signed)
Last seen on 10/22/22 Follow up scheduled on 10/21/22

## 2023-09-30 ENCOUNTER — Ambulatory Visit (INDEPENDENT_AMBULATORY_CARE_PROVIDER_SITE_OTHER): Payer: Medicare PPO

## 2023-09-30 DIAGNOSIS — I442 Atrioventricular block, complete: Secondary | ICD-10-CM

## 2023-09-30 NOTE — Progress Notes (Deleted)
Name: Brittany [name]  Age/ Sex: 63 y.o., female   MRN/ DOB: [id number], 1961-03-21     PCP: Shade Flood, MD   Reason for Endocrinology Evaluation: Type 2 Diabetes Mellitus  Initial Endocrine Consultative Visit: 05/04/2018    PATIENT IDENTIFIER: Brittany [name] is a 63 y.o. female with a past medical history of T2DM, HTN, fibromyalgia and dyslipidemia. The patient has followed with Endocrinology clinic since 05/04/2018 for consultative assistance with management of her diabetes.  DIABETIC HISTORY:  Brittany [name] was diagnosed with DM 1999, and started insulin therapy in 2019. Her hemoglobin A1c has ranged from 6.6% in 2013, peaking at >14.0% in 2018.    Was last seen by Dr. Everardo All in March 2023  Switch Trulicity to [facility] 05/2023  SUBJECTIVE:   During the last visit (05/26/2023): A1c 9.0%     Today (09/30/2023): Brittany [name] is here for a follow up on diabetes management.  She checks her blood sugars multiple  times daily, through CGM. 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  Denies constipation or diarrhea  Denies nausea or vomiting  Husband is a caregiver but he  is on dialysis    HOME DIABETES REGIMEN:  Mounjaro 5 mg weekly Lantus 25 units daily  NovoLog 5 units with meals  correction scale: NovoLog (BG -130/40)    Statin: yes ACE-I/ARB: Yes    CONTINUOUS GLUCOSE MONITORING RECORD INTERPRETATION : Unable to download   Sensor description:dexcom G7  Results statistic   DIABETIC COMPLICATIONS: Microvascular complications:   Denies: CKD Last Eye Exam: Completed 10/2022  Macrovascular complications:  CVA Denies: CAD, PVD   HISTORY:  Past Medical History:  Past Medical History:  Diagnosis Date   Asthma    Cataract    Phreesia 10/03/2020   Chronic lower back pain    Depression    Depression    Phreesia 10/03/2020   Diabetes  mellitus without complication (HCC)    Phreesia 10/03/2020   Family history of adverse reaction to anesthesia    Mother has trouble coming out of anesthesia; patient reports she has no problems   Fibromyalgia    High cholesterol    Hypertension    Phreesia 10/03/2020   Presence of permanent cardiac pacemaker    Seizures (HCC) 11/2017   "day after she came home from hospital after having stroke" (01/01/2018)   Stroke (HCC) 12/06/2017   "speech issues since" (01/01/2018)   Type II diabetes mellitus (HCC)    Past Surgical History:  Past Surgical History:  Procedure Laterality Date   CHOLECYSTECTOMY     EYE SURGERY N/A    Phreesia 10/03/2020   GAS/FLUID EXCHANGE Right 12/12/2019   Procedure: Gas/Fluid Exchange;  Surgeon: Carmela Rima, MD;  Location: [facility] OR;  Service: Ophthalmology;  Laterality: Right;   INSERT / REPLACE / REMOVE PACEMAKER  01/01/2018   IR CT HEAD LTD  12/06/2017   IR PERCUTANEOUS ART THROMBECTOMY/INFUSION INTRACRANIAL INC DIAG ANGIO  12/06/2017   LOOP RECORDER INSERTION N/A 12/12/2017   Procedure: LOOP RECORDER INSERTION;  Surgeon: Regan Lemming, MD;  Location: [facility] INVASIVE CV LAB;  Service: Cardiovascular;  Laterality: N/A;   LOOP RECORDER REMOVAL  01/01/2018   LOOP RECORDER REMOVAL N/A 01/01/2018   Procedure: LOOP RECORDER REMOVAL;  Surgeon: Regan Lemming, MD;  Location: [facility] INVASIVE CV LAB;  Service: Cardiovascular;  Laterality: N/A;   NECK  SURGERY     PACEMAKER IMPLANT N/A 01/01/2018   Procedure: PACEMAKER IMPLANT;  Surgeon: Regan Lemming, MD;  Location: [facility] INVASIVE CV LAB;  Service: Cardiovascular;  Laterality: N/A;   PHOTOCOAGULATION Right 12/12/2019   Procedure: Photocoagulation;  Surgeon: Carmela Rima, MD;  Location: [facility] OR;  Service: Ophthalmology;  Laterality: Right;   RADIOLOGY WITH ANESTHESIA N/A 12/06/2017   Procedure: CODE STROKE;  Surgeon: Julieanne Cotton, MD;  Location: [facility] OR;  Service: Radiology;  Laterality: N/A;   SCLERAL BUCKLE WITH  POSSIBLE 25 GAUGE PARS PLANA VITRECTOMY Right 12/12/2019   Procedure: SCLERAL BUCKLE WITH  PARS PLANA VITRECTOMY RETINAL DETACHMENT REPAIR;  Surgeon: Carmela Rima, MD;  Location: [facility] OR;  Service: Ophthalmology;  Laterality: Right;   TEE WITHOUT CARDIOVERSION N/A 12/10/2017   Procedure: TRANSESOPHAGEAL ECHOCARDIOGRAM (TEE);  Surgeon: Jake Bathe, MD;  Location: [facility] ENDOSCOPY;  Service: Cardiovascular;  Laterality: N/A;   THROMBECTOMY FEMORAL ARTERY Right 12/07/2017   Procedure: THROMBECTOMY RIGHT FEMORAL ARTERY;  Surgeon: Maeola Harman, MD;  Location: [facility] OR;  Service: Vascular;  Laterality: Right;   TONSILLECTOMY     Social History:  reports that she has quit smoking. Her smoking use included cigarettes. She has a 38 pack-year smoking history. She has never used smokeless tobacco. She reports current alcohol use. She reports that she does not use drugs. Family History:  Family History  Problem Relation Age of Onset   Cancer Mother    Diabetes Mother    Heart disease Mother    Parkinsonism Mother    Diabetes Father    Heart disease Father    Dementia Father    COPD Maternal Aunt    Cancer Maternal Grandmother 64       lung   Breast cancer Neg Hx      HOME MEDICATIONS: Allergies as of 10/01/2023   No Known Allergies      Medication List        Accurate as of September 30, 2023  2:17 PM. If you have any questions, ask your nurse or doctor.          Accu-Chek Guide Me w/Device Kit 1 each by Does not apply route 4 (four) times daily. E11.9   Accu-Chek Guide test strip Generic drug: glucose blood USE AS DIRECTED FOUR TIMES DAILY   acetaminophen 500 MG tablet Commonly known as: TYLENOL Take 1,000 mg by mouth every 6 (six) hours as needed for fever (pain).   Alphagan P 0.1 % Soln Generic drug: brimonidine Apply to eye.   brimonidine 0.15 % ophthalmic solution Commonly known as: ALPHAGAN SMARTSIG:In Eye(s)   Alphagan P 0.1 % Soln Generic drug:  brimonidine Apply to eye.   BD Pen Needle Nano 2nd Gen 32G X 4 MM Misc Generic drug: Insulin Pen Needle USE FOUR TIMES DAILY AS DIRECTED   clopidogrel 75 MG tablet Commonly known as: PLAVIX TAKE 1 TABLET(75 MG) BY MOUTH DAILY   Dexcom G7 Sensor Misc 1 Device by Does not apply route as directed.   Insulin Aspart FlexPen 100 UNIT/ML Commonly known as: NOVOLOG INJECT UNDER THE SKIN AS DIRECTED; MAX DAILY 30 UNITS PER SCALE   irbesartan 150 MG tablet Commonly known as: AVAPRO TAKE 1 TABLET(150 MG) BY MOUTH DAILY   Lantus SoloStar 100 UNIT/ML Solostar Pen Generic drug: insulin glargine Inject 22 Units into the skin daily.   levETIRAcetam 500 MG tablet Commonly known as: KEPPRA Take 1 tablet (500 mg total) by mouth 2 (two) times daily.   LORazepam 0.5  MG tablet Commonly known as: Ativan Take 0.5-1 tablets (0.25-0.5 mg total) by mouth 2 (two) times daily as needed for anxiety (anxiety and tremors (social situations)).   Multi-B Complex Caps Take one a day ,B complex  multivitamin over the counter or use prenatal vitamin.   ofloxacin 0.3 % ophthalmic solution Commonly known as: OCUFLOX   ofloxacin 0.3 % OTIC solution Commonly known as: FLOXIN Place 5 drops into the right ear 2 (two) times daily.   OneTouch Delica Plus Lancet33G Misc USE FOUR TIMES DAILY   Prednisol Ace-Moxiflox-Bromfen 1-0.5-0.075 % Susp Place 1 drop into the left eye 4 (four) times daily.   prednisoLONE acetate 1 % ophthalmic suspension Commonly known as: PRED FORTE SMARTSIG:In Eye(s)   primidone 250 MG tablet Commonly known as: MYSOLINE Take 1 tablet (250 mg total) by mouth daily. TAKE 1 TABLET(250 MG) BY MOUTH DAILY   rosuvastatin 20 MG tablet Commonly known as: CRESTOR TAKE 1 TABLET(20 MG) BY MOUTH DAILY   sertraline 100 MG tablet Commonly known as: ZOLOFT TAKE 2 TABLETS(200 MG) BY MOUTH AT BEDTIME   timolol 0.5 % ophthalmic solution Commonly known as: TIMOPTIC SMARTSIG:In Eye(s)    tirzepatide 5 MG/0.5ML Pen Commonly known as: MOUNJARO Inject 5 mg into the skin once a week.         OBJECTIVE:   Vital Signs: There were no vitals taken for this visit.  Wt Readings from Last 3 Encounters:  05/26/23 174 lb (78.9 kg)  05/14/23 172 lb 12.8 oz (78.4 kg)  11/20/22 162 lb (73.5 kg)     Exam: General: Pt appears well and is in NAD  Lungs: Clear with good BS bilat   Heart: RRR   Extremities: No pretibial edema.   Neuro: MS is good with appropriate affect, pt is alert and Ox3    DM foot exam: 05/26/2023  The skin of the feet is intact without sores or ulcerations. The pedal pulses are 2+ on right and 2+ on left. The sensation is intact to a screening 5.07, 10 gram monofilament bilaterally        DATA REVIEWED:  Lab Results  Component Value Date   HGBA1C 9.0 (A) 05/26/2023   HGBA1C 6.7 (H) 11/11/2022   HGBA1C 9.7 (A) 04/02/2022    Latest Reference Range & Units 05/14/23 14:41  Sodium 135 - 145 mEq/L 136  Potassium 3.5 - 5.1 mEq/L 3.7  Chloride 96 - 112 mEq/L 103  CO2 19 - 32 mEq/L 27  Glucose 70 - 99 mg/dL 725 (H)  BUN 6 - 23 mg/dL 12  Creatinine 3.66 - 4.40 mg/dL 3.47  Calcium 8.4 - 42.5 mg/dL 9.2  Alkaline Phosphatase 39 - 117 U/L 121 (H)  Albumin 3.5 - 5.2 g/dL 3.9  AST 0 - 37 U/L 14  ALT 0 - 35 U/L 12  Total Protein 6.0 - 8.3 g/dL 6.9  Total Bilirubin 0.2 - 1.2 mg/dL 0.3  GFR >95.63 mL/min 93.65    Latest Reference Range & Units 05/14/23 14:41  Total CHOL/HDL Ratio  2  Cholesterol 0 - 200 mg/dL 875  HDL Cholesterol >64.33 mg/dL 29.51  LDL (calc) 0 - 99 mg/dL 31  NonHDL  88.41  Triglycerides 0.0 - 149.0 mg/dL 660.6  VLDL 0.0 - 30.1 mg/dL 60.1    Old records , labs and images have been reviewed.     ASSESSMENT / PLAN / RECOMMENDATIONS:   1) Type 2 Diabetes Mellitus, Poorly  controlled, With Macrovascular complications - Most recent A1c of 9.0 %.  Goal A1c < 7.0 %.    -A1c has trended up again, this is due to lack of prandial  insulin -We briefly discussed SGLT2 inhibitors, we will consider this in the future if needed  -Barriers to diabetes self-care is history of CVA and memory issues as she is not consistently taking her prandial insulin -I have suggested switching Trulicity to [facility] as below -I will also increase her basal insulin as below -In manual reviewing her CGM app, patient has been noted with persistent hyperglycemia overnight, worsening postprandial hyperglycemia followed by hypoglycemia after a bolus -I will also decrease her prandial insulin to a standing dose to decrease confusion with different dosing for different meals  MEDICATIONS: Switch Trulicity 4.5 mg weekly to Mounjaro 5 mg weekly Increase Lantus 22 units daily Change  NovoLog 5 units  TIDQAC Continue correction scale: NovoLog (BG -130/40)  EDUCATION / INSTRUCTIONS: BG monitoring instructions: Patient is instructed to check her blood sugars 3 times a day, before each meal. Call [facility] if: BG persistently < 70  I reviewed the Rule of 15 for the treatment of hypoglycemia in detail with the patient. Literature supplied.Marland Kitchen   2) Diabetic complications:  Eye: Does not have known diabetic retinopathy.  Neuro/ Feet: Does not have known diabetic peripheral neuropathy .  Renal: Patient does not have known baseline CKD. She   is  on an ACEI/ARB at present.    F/U in 4 months   Signed electronically by: Lyndle Herrlich, MD  [facility]  [facility] [address] [place] [place], [place] [address] Phone: [phone] FAX: [phone]   CC: Shade Flood, MD [address] [place] [address] Phone: [phone]  Fax: [phone]  Return to Endocrinology clinic as below: Future Appointments  Date Time Provider Department Center  10/01/2023 11:10 AM Geovanny Sartin, Konrad Dolores, MD [facility] None  10/22/2023  2:45 PM Ihor Austin, NP [facility] None  11/12/2023   1:40 PM Shade Flood, MD [facility]  12/30/2023  7:50 AM [facility] DEVICE REMOTES [facility]  03/30/2024  7:50 AM [facility] DEVICE REMOTES [facility]  06/29/2024  7:50 AM [facility] DEVICE REMOTES [facility]

## 2023-10-01 ENCOUNTER — Ambulatory Visit: Payer: Medicare PPO | Admitting: Internal Medicine

## 2023-10-01 LAB — CUP PACEART REMOTE DEVICE CHECK
Battery Remaining Longevity: 114 mo
Battery Voltage: 3 V
Brady Statistic AP VP Percent: 2.68 %
Brady Statistic AP VS Percent: 0.75 %
Brady Statistic AS VP Percent: 30.22 %
Brady Statistic AS VS Percent: 66.35 %
Brady Statistic RA Percent Paced: 3.43 %
Brady Statistic RV Percent Paced: 32.9 %
Date Time Interrogation Session: 20250121100507
Implantable Lead Connection Status: 753985
Implantable Lead Connection Status: 753985
Implantable Lead Implant Date: 20190425
Implantable Lead Implant Date: 20190425
Implantable Lead Location: 753859
Implantable Lead Location: 753860
Implantable Lead Model: 5076
Implantable Lead Model: 5076
Implantable Pulse Generator Implant Date: 20190425
Lead Channel Impedance Value: 342 Ohm
Lead Channel Impedance Value: 380 Ohm
Lead Channel Impedance Value: 380 Ohm
Lead Channel Impedance Value: 456 Ohm
Lead Channel Pacing Threshold Amplitude: 0.5 V
Lead Channel Pacing Threshold Amplitude: 0.875 V
Lead Channel Pacing Threshold Pulse Width: 0.4 ms
Lead Channel Pacing Threshold Pulse Width: 0.4 ms
Lead Channel Sensing Intrinsic Amplitude: 11.5 mV
Lead Channel Sensing Intrinsic Amplitude: 11.5 mV
Lead Channel Sensing Intrinsic Amplitude: 2.875 mV
Lead Channel Sensing Intrinsic Amplitude: 2.875 mV
Lead Channel Setting Pacing Amplitude: 1.5 V
Lead Channel Setting Pacing Amplitude: 2.5 V
Lead Channel Setting Pacing Pulse Width: 0.4 ms
Lead Channel Setting Sensing Sensitivity: 2 mV
Zone Setting Status: 755011
Zone Setting Status: 755011

## 2023-10-03 ENCOUNTER — Telehealth: Payer: Self-pay

## 2023-10-03 ENCOUNTER — Other Ambulatory Visit (HOSPITAL_COMMUNITY): Payer: Self-pay

## 2023-10-03 ENCOUNTER — Other Ambulatory Visit: Payer: Self-pay

## 2023-10-03 DIAGNOSIS — F331 Major depressive disorder, recurrent, moderate: Secondary | ICD-10-CM

## 2023-10-03 DIAGNOSIS — I10 Essential (primary) hypertension: Secondary | ICD-10-CM

## 2023-10-03 DIAGNOSIS — I6930 Unspecified sequelae of cerebral infarction: Secondary | ICD-10-CM

## 2023-10-03 DIAGNOSIS — E1165 Type 2 diabetes mellitus with hyperglycemia: Secondary | ICD-10-CM

## 2023-10-03 MED ORDER — SERTRALINE HCL 100 MG PO TABS: ORAL_TABLET | ORAL | 1 refills | Status: DC

## 2023-10-03 MED ORDER — IRBESARTAN 150 MG PO TABS: ORAL_TABLET | ORAL | 3 refills | Status: DC

## 2023-10-03 MED ORDER — CLOPIDOGREL BISULFATE 75 MG PO TABS: ORAL_TABLET | ORAL | 3 refills | Status: DC

## 2023-10-03 MED ORDER — LANTUS SOLOSTAR 100 UNIT/ML ~~LOC~~ SOPN: 22.0000 [IU] | PEN_INJECTOR | Freq: Every day | SUBCUTANEOUS | 0 refills | Status: DC

## 2023-10-03 NOTE — Telephone Encounter (Signed)
Pharmacy Patient Advocate Encounter   Received notification from Pt Calls Messages that prior authorization for Brittany [name] is required/requested.   Insurance verification completed.   The patient is insured through [name] .   Per test claim: PA required; PA submitted to above mentioned insurance via CoverMyMeds Key/confirmation #/EOC [id number] Status is pending   PA APPROVED through 09/08/2024

## 2023-10-03 NOTE — Telephone Encounter (Signed)
Copied from CRM [id number]. Topic: Clinical - Prescription Issue >> Oct 03, 2023  8:19 AM [name] wrote: Reason for CRM: Shanda Bumps from [facility] - Phone: [phone] - Fax: [phone] --called and stated a prescription request was faxed to [place] 09/24/23 for patient's prescriptions:  clopidogrel (PLAVIX) 75 MG tablet [[id number]] irbesartan (AVAPRO) 150 MG tablet [[id number]]  rosuvastatin (CRESTOR) 20 MG tablet [[id number]] sertraline (ZOLOFT) 100 MG tablet [[id number]]

## 2023-10-03 NOTE — Telephone Encounter (Signed)
Mounjaro needs PA

## 2023-10-06 ENCOUNTER — Telehealth: Payer: Self-pay

## 2023-10-06 NOTE — Telephone Encounter (Signed)
Contact patient to schedule another appt as she missed last one.

## 2023-10-08 DIAGNOSIS — H34811 Central retinal vein occlusion, right eye, with macular edema: Secondary | ICD-10-CM | POA: Diagnosis not present

## 2023-10-20 DIAGNOSIS — E1165 Type 2 diabetes mellitus with hyperglycemia: Secondary | ICD-10-CM | POA: Diagnosis not present

## 2023-10-22 ENCOUNTER — Ambulatory Visit: Payer: Medicare PPO | Admitting: Adult Health

## 2023-10-22 ENCOUNTER — Encounter: Payer: Self-pay | Admitting: Adult Health

## 2023-10-22 VITALS — BP 139/60 | HR 73 | Ht 63.0 in | Wt 151.0 lb

## 2023-10-22 DIAGNOSIS — R569 Unspecified convulsions: Secondary | ICD-10-CM | POA: Diagnosis not present

## 2023-10-22 DIAGNOSIS — Z8673 Personal history of transient ischemic attack (TIA), and cerebral infarction without residual deficits: Secondary | ICD-10-CM | POA: Diagnosis not present

## 2023-10-22 DIAGNOSIS — I639 Cerebral infarction, unspecified: Secondary | ICD-10-CM

## 2023-10-22 DIAGNOSIS — G250 Essential tremor: Secondary | ICD-10-CM | POA: Diagnosis not present

## 2023-10-22 DIAGNOSIS — F4323 Adjustment disorder with mixed anxiety and depressed mood: Secondary | ICD-10-CM | POA: Diagnosis not present

## 2023-10-22 MED ORDER — PRIMIDONE 250 MG PO TABS: 250.0000 mg | ORAL_TABLET | Freq: Every day | ORAL | 3 refills | Status: DC

## 2023-10-22 MED ORDER — LEVETIRACETAM 500 MG PO TABS: 500.0000 mg | ORAL_TABLET | Freq: Two times a day (BID) | ORAL | 3 refills | Status: DC

## 2023-10-22 NOTE — Progress Notes (Signed)
[facility] [address] [place]. [place] [address] [phone]       FOLLOW-UP NOTE  [name] Date of Birth:  April 21, 1961 Medical Record Number:  [id number]   Reason for visit: Follow-up stroke   Chief Complaint  Patient presents with   Follow-up    Pt in 3 Pt here for stroke f/u Pt states just doesn't want to have another stroke Pt having facial tremors     Follow-up visit:  Prior visit: 10/22/2022  Brief HPI:   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    Interval history:  Left MCA stroke 11/2017 -Residual expressive aphasia and cognitive impairment- at times can fluctuate especially towards the end of the day but overall stable -Remains on clopidogrel and Crestor 20 mg daily -followed by endocrinology Dr. Lonzo Cloud for DM management, last visit 2023-07-01 with A1c 9.0, missed recent follow up visit (had appt days mixed up), now on Monjaro, has lost 20 lbs since starting  -Blood pressure well-controlled -pacemaker has not shown evidence of atrial fibrillation thus far -current tobacco use about 1 PPD   Post stroke seizure -No recurrent episodes/seizure activity  -Continues on Keppra 500 mg twice daily tolerating without side effects   Depression/anxiety Adjustment disorder -Overall stable with ongoing use of sertraline 200 mg nightly (per PCP).   -She does become tearful today discussing multiple family stressors, she helps take care of her grandchildren, son's GF passed away 30-Jun-2022 and started smoking again around that time, son cares for his own 5 children and late GF's 53 year old son, husband has his own health conditions including having dialysis 3 days weekly   Essential tremors -stable without worsening, affects upper extremities and  head -Continues on primidone 250 mg daily tolerating well without side effects      ROS:   14 system review of systems is positive for those listed in HPI and all other systems negative   PMH:  Past Medical History:  Diagnosis Date   Asthma    Cataract    Phreesia 10/03/2020   Chronic lower back pain    Depression    Depression    Phreesia 10/03/2020   Diabetes mellitus without complication (HCC)    Phreesia 10/03/2020   Family history of adverse reaction to anesthesia    Mother has trouble coming out of anesthesia; patient reports she has no problems   Fibromyalgia    High cholesterol    Hypertension    Phreesia 10/03/2020   Presence of permanent cardiac pacemaker    Seizures (HCC) 11/2017   "day after she came home from hospital after having stroke" (01/01/2018)   Stroke (HCC) 12/06/2017   "speech issues since" (01/01/2018)   Type II diabetes mellitus (HCC)     Social History:  Social History   Socioeconomic History   Marital status: Married    Spouse name: [name]   Number of children: 2   Years of education: 14   Highest education level: Not on file  Occupational History   Not on file  Tobacco Use   Smoking status: Former    Current packs/day: 1.00    Average packs/day: 1 pack/day for 38.0 years (38.0 ttl pk-yrs)    Types: Cigarettes   Smokeless tobacco: Never   Tobacco comments:    Pt  used chantix July till January, quit first of the year   Vaping Use   Vaping status: Never Used  Substance and Sexual Activity   Alcohol use: Yes    Comment: 01/01/2018 "nothing since stroke 11/2017"   Drug use: No   Sexual activity: Not Currently  Other Topics Concern   Not on file  Social History Narrative   Patient is married ([name]) and lives at home with her family   Patient has two children.   Patient works at [employer].   Patient has a college education.   Patient is right-handed.   Patient drinks 3 cups of coffee M-F.      Social Drivers of Manufacturing engineer Strain: Not on file  Food Insecurity: Not on file  Transportation Needs: Not on file  Physical Activity: Not on file  Stress: Not on file  Social Connections: Not on file  Intimate Partner Violence: Not on file    Medications:   Current Outpatient Medications on File Prior to Visit  Medication Sig Dispense Refill   ACCU-CHEK GUIDE test strip USE AS DIRECTED FOUR TIMES DAILY 100 strip 5   ALPHAGAN P 0.1 % SOLN Apply to eye.     ALPHAGAN P 0.1 % SOLN Apply to eye.     B Complex-Biotin-FA (MULTI-B COMPLEX) CAPS Take one a day ,B complex  multivitamin over the counter or use prenatal vitamin. 90 capsule 0   Blood Glucose Monitoring Suppl (ACCU-CHEK GUIDE ME) w/Device KIT 1 each by Does not apply route 4 (four) times daily. E11.9 1 kit 0   clopidogrel (PLAVIX) 75 MG tablet TAKE 1 TABLET(75 MG) BY MOUTH DAILY 90 tablet 3   Continuous Blood Gluc Sensor (DEXCOM G7 SENSOR) MISC 1 Device by Does not apply route as directed. 9 each 3   Insulin Aspart FlexPen (NOVOLOG) 100 UNIT/ML INJECT UNDER THE SKIN AS DIRECTED; MAX DAILY 30 UNITS PER SCALE 30 mL 3   insulin glargine (LANTUS SOLOSTAR) 100 UNIT/ML Solostar Pen Inject 22 Units into the skin daily. 30 mL 0   Insulin Pen Needle (BD PEN NEEDLE NANO 2ND GEN) 32G X 4 MM MISC USE FOUR TIMES DAILY AS DIRECTED 400 each 3   irbesartan (AVAPRO) 150 MG tablet TAKE 1 TABLET(150 MG) BY MOUTH DAILY 90 tablet 3   Lancets (ONETOUCH DELICA PLUS LANCET33G) MISC USE FOUR TIMES DAILY 400 each 3   levETIRAcetam (KEPPRA) 500 MG tablet Take 1 tablet (500 mg total) by mouth 2 (two) times daily. 180 tablet 0   LORazepam (ATIVAN) 0.5 MG tablet Take 0.5-1 tablets (0.25-0.5 mg total) by mouth 2 (two) times daily as needed for anxiety (anxiety and tremors (social situations)). 45 tablet 0   ofloxacin (FLOXIN) 0.3 % OTIC solution Place 5 drops into the right ear 2 (two) times daily. 5 mL 0   primidone (MYSOLINE) 250 MG tablet Take 1 tablet (250 mg total) by  mouth daily. TAKE 1 TABLET(250 MG) BY MOUTH DAILY 90 tablet 0   rosuvastatin (CRESTOR) 20 MG tablet TAKE 1 TABLET(20 MG) BY MOUTH DAILY 90 tablet 3   sertraline (ZOLOFT) 100 MG tablet TAKE 2 TABLETS(200 MG) BY MOUTH AT BEDTIME 180 tablet 1   timolol (TIMOPTIC) 0.5 % ophthalmic solution SMARTSIG:In Eye(s)     tirzepatide (MOUNJARO) 5 MG/0.5ML Pen Inject 5 mg into the skin once a week. 6 mL 3   acetaminophen (TYLENOL) 500 MG tablet Take 1,000 mg by mouth every 6 (six) hours as needed for  fever (pain).     brimonidine (ALPHAGAN) 0.15 % ophthalmic solution SMARTSIG:In Eye(s)     ofloxacin (OCUFLOX) 0.3 % ophthalmic solution      Prednisol Ace-Moxiflox-Bromfen 1-0.5-0.075 % SUSP Place 1 drop into the left eye 4 (four) times daily.     prednisoLONE acetate (PRED FORTE) 1 % ophthalmic suspension SMARTSIG:In Eye(s)     No current facility-administered medications on file prior to visit.    Allergies:  No Known Allergies  Today's Vitals   10/22/23 1452  BP: 139/60  Pulse: 73  Weight: 151 lb (68.5 kg)  Height: 5\' 3"  (1.6 m)   Body mass index is 26.75 kg/m.  General: well developed, well nourished, very pleasant middle-age Caucasian female, seated, in no evident distress Head: head normocephalic and atraumatic.   Neck: supple with no carotid or supraclavicular bruits Cardiovascular: regular rate and rhythm, no murmurs  Neurologic Exam Mental Status: Awake and fully alert.  Mild expressive aphasia with speech hesitancy.  Oriented to place and time. Recent memory impaired and remote memory intact. Attention span, concentration and fund of knowledge appropriate today. Mood and affect appropriate throughout visit Cranial Nerves: Pupils equal, briskly reactive to light. Extraocular movements full without nystagmus. Visual fields full to confrontation. Hearing intact. Facial sensation intact. Face, tongue, palate moves normally and symmetrically.  Motor: Normal bulk and tone. Normal strength in all  tested extremity muscles.  Sensory.: intact to touch , pinprick , position and vibratory sensation.  Coordination: Mild tremor head and upper arms R>L with outstretched arms and amplified RUE finger-to-nose testing.  No resting tremor.  No evidence of lower extremity tremor. No evidence of cogwheel rigidity. Gait and Station: Arises from chair without difficulty. Stance is normal. Gait demonstrates normal stride length and balance without use of assistive device Reflexes: 1+ and symmetric. Toes downgoing.        ASSESSMENT/PLAN: 63 year old lady with aphasia secondary to left MCA infarct in April 2019 of cryptogenic etiology treated with IV TPA and mechanical thrombectomy with good recanalization. She also had symptomatic seizure after discharge and residual stroke deficit of expressive aphasia and cognitive impairment currently receiving long-term disability.  Restarted on primidone due to essential tremors which has been stable.  Great difficulty with poststroke depression/anxiety and adjustment disorder which has since stabilized    Left MCA stroke -Continue Plavix and Crestor for secondary stroke prevention managed/rx'd by PCP -Close PCP follow-up for aggressive stroke risk factor management including HTN with BP goal<130/90 and HLD with LDL goal<70 -advised to schedule f/u visit with endocrinology - she has had difficulty contacting them but per epic, they have also had difficulty contacting her to schedule, will send message to assist with scheduling  -pacer routinely monitored by cardiology -negative for atrial fibrillation -Discussed importance of complete tobacco cessation as continued use greatly increases risk of recurrent strokes, cardiovascular risk factors and multiple other health related conditions   Post stroke seizure -Continue Keppra 500 mg twice daily for seizure prophylaxis - refill provided -No recurrent seizure episodes.  Advised to call with any seizure symptoms or  events   Essential tremors -Continue primidone 250 mg daily -refill provided -Could consider adding propranolol in the future if symptoms should worsen   Mood disorder -Continue to follow with PCP for ongoing management     Follow-up in 1 year or call earlier if needed   CC:  Shade Flood, MD     I spent a prolonged 45 minutes of face-to-face and non-face-to-face time with patient.  This included previsit chart review, lab review, study review, order entry, electronic health record documentation, patient education and discussion regarding above plan disease and treatment plan and answered all the questions to patient's satisfaction  Ihor Austin, [facility]  [facility] [address] [place], [place] [address]  Phone [phone] Fax [phone] Note: This document was prepared with digital dictation and possible smart phrase technology. Any transcriptional errors that result from this process are unintentional.

## 2023-10-22 NOTE — Progress Notes (Signed)
I apologize. That was supposed to be sent to her endo office. Please disregard! Sorry about that!

## 2023-10-22 NOTE — Patient Instructions (Addendum)
Your Plan:  Continue Keppra 500 mg twice daily for seizure prevention  Continue primidone 250 mg nightly for tremors  Continue to follow with PCP and endocrinology for aggressive stroke risk factor management -please ensure you schedule a follow-up visit with endocrinology      Follow-up in 1 year or call earlier if needed      Thank you for coming to see [place] at [facility]. I hope we have been able to provide you high quality care today.  You may receive a patient satisfaction survey over the next few weeks. We would appreciate your feedback and comments so that we may continue to improve ourselves and the health of our patients.

## 2023-10-24 DIAGNOSIS — H353221 Exudative age-related macular degeneration, left eye, with active choroidal neovascularization: Secondary | ICD-10-CM | POA: Diagnosis not present

## 2023-10-25 ENCOUNTER — Other Ambulatory Visit: Payer: Self-pay | Admitting: Internal Medicine

## 2023-11-12 ENCOUNTER — Ambulatory Visit: Payer: Medicare PPO | Admitting: Family Medicine

## 2023-11-12 VITALS — BP 128/70 | HR 72 | Temp 98.0°F | Ht 63.0 in | Wt 145.4 lb

## 2023-11-12 DIAGNOSIS — Z794 Long term (current) use of insulin: Secondary | ICD-10-CM

## 2023-11-12 DIAGNOSIS — E1149 Type 2 diabetes mellitus with other diabetic neurological complication: Secondary | ICD-10-CM | POA: Diagnosis not present

## 2023-11-12 DIAGNOSIS — Z23 Encounter for immunization: Secondary | ICD-10-CM

## 2023-11-12 DIAGNOSIS — E162 Hypoglycemia, unspecified: Secondary | ICD-10-CM

## 2023-11-12 DIAGNOSIS — I6930 Unspecified sequelae of cerebral infarction: Secondary | ICD-10-CM

## 2023-11-12 DIAGNOSIS — E785 Hyperlipidemia, unspecified: Secondary | ICD-10-CM | POA: Diagnosis not present

## 2023-11-12 DIAGNOSIS — I10 Essential (primary) hypertension: Secondary | ICD-10-CM | POA: Diagnosis not present

## 2023-11-12 DIAGNOSIS — Z720 Tobacco use: Secondary | ICD-10-CM | POA: Diagnosis not present

## 2023-11-12 NOTE — Progress Notes (Signed)
 Subjective:  Patient ID: Brittany [name], female    DOB: September 29, 1960  Age: 63 y.o. MRN: [id number]  CC:  Chief Complaint  Patient presents with   Medical Management of Chronic Issues    Pt is doing well feels well, patient notes she needs adjustment to her insulin due to weight loss     HPI [name] presents for   Diabetes:  With history of CVA, followed by endocrinology.  As above she is requesting a lower dose of insulin due to weight loss.  Followed by Dr. Lonzo Cloud, last office visit September 2024.  Switch Trulicity to [facility] at that time, Lantus was increased to 22 units, NovoLog 5 units 3 times daily AC, and correction scale NovoLog.  63-month follow-up planned. Trouble trying to get an appointment. Weight has improved on Mounjaro.   Off novolog with meals past 2 months - still on 22 units of lantus, and Mounjaro.  Currently 105. Highest 130's recently. On CGM.  Nighttime lows to 45-50. Having to eat snack, 3 days per week.  Microalbumin nl 11/11/22.  Regular optho eval.  Flu vaccine declines. Also declines colon Ca screening, prior polyps.  2nd shingrix today.  Lung CA screening:  smoker - 21 years. Quit then restarted. Not interested in Lung CA screening.   Lab Results  Component Value Date   HGBA1C 9.0 (A) 05/26/2023   HGBA1C 6.7 (H) 11/11/2022   HGBA1C 9.7 (A) 04/02/2022   Lab Results  Component Value Date   MICROALBUR 1.2 11/11/2022   LDLCALC 31 05/14/2023   CREATININE 0.64 05/14/2023   Wt Readings from Last 3 Encounters:  11/12/23 145 lb 6.4 oz (66 kg)  10/22/23 151 lb (68.5 kg)  05/26/23 174 lb (78.9 kg)   Hypertension: Irbesartan 150mg  every day, no new side effects.  Home readings: none.  BP Readings from Last 3 Encounters:  11/12/23 128/70  10/22/23 139/60  09/12/23 (!) 162/75   Lab Results  Component Value Date   CREATININE 0.64 05/14/2023    Hyperlipidemia: Crestor 20 mg daily.  Goal LDL at least under 70 with history of CVA.   Last LDL looked good in September at 29.  No new myalgias or side effects of meds. Lab Results  Component Value Date   CHOL 100 05/14/2023   HDL 46.90 05/14/2023   LDLCALC 31 05/14/2023   LDLDIRECT 61 12/03/2013   TRIG 112.0 05/14/2023   CHOLHDL 2 05/14/2023   Lab Results  Component Value Date   ALT 12 05/14/2023   AST 14 05/14/2023   ALKPHOS 121 (H) 05/14/2023   BILITOT 0.3 05/14/2023   Neuro History of CVA, seizure. Left MCA stroke, poststroke seizure, appointment with neurology February 12.  Continued on Plavix and Crestor for secondary stroke prevention, goal BP under 130/90, LDL under 70, endocrinology follow-up, and Keppra 500 mg twice daily for seizure prophylaxis.  No recurrent seizure episodes.  Also continued on primidone 250 mg daily for essential tremors with option of adding propranolol in the future if symptoms worsened.  Depression: Treated with sertraline 200 mg daily with lorazepam as needed.  Family stressors discussed at her September visit, and felt like she was stable in September.  Continues on same regimen. Feels like managing stressors ok. No recent need for alprazolam.       05/14/2023    1:55 PM 11/11/2022    1:49 PM 06/06/2022   11:12 AM 04/08/2022    1:28 PM 10/08/2021    1:25 PM  Depression  screen PHQ 2/9  Decreased Interest 0 0 0 0 0  Down, Depressed, Hopeless 0 0 1 0 0  PHQ - 2 Score 0 0 1 0 0  Altered sleeping  0 1 2 1   Tired, decreased energy  0 0 1 1  Change in appetite  0 0 1 1  Feeling bad or failure about yourself   0 1 0 0  Trouble concentrating  0 0 0 0  Moving slowly or fidgety/restless  1 0 0 1  Suicidal thoughts  0 0 0 0  PHQ-9 Score  1 3 4 4           History Patient Active Problem List   Diagnosis Date Noted   Long-term (current) use of injectable non-insulin antidiabetic drugs 05/26/2023   Type 2 diabetes mellitus with hyperglycemia, with long-term current use of insulin (HCC) 04/02/2022   Diabetes mellitus (HCC) 04/02/2022    Bilateral high frequency sensorineural hearing loss 02/25/2019   Non compliance with medical treatment 03/23/2018   History of tobacco use disorder 01/01/2018   Syncope and collapse 01/01/2018   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 12/12/2017   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 cerebrovascular accident (CVA) (HCC) 12/12/2017   Acute right hemiparesis (HCC) 12/12/2017   History of completed stroke 12/12/2017   Leukocytosis 12/12/2017   Hypokalemia 12/12/2017   Hypotension    Middle cerebral artery embolism, left 12/06/2017   Poor compliance with CPAP treatment 03/04/2017   OSA and COPD overlap syndrome (HCC) 03/04/2017   Simple chronic bronchitis (HCC) 03/04/2017   MCI (mild cognitive impairment) 11/27/2016   Left sided lacunar infarction (HCC) 03/11/2014   Cerebrovascular small vessel disease 03/11/2014   Insomnia, idiopathic 12/20/2013   Snoring 12/06/2013   Essential hypertension, benign 10/26/2012   Hyperlipidemia LDL goal <70 10/26/2012   Polypharmacy 10/26/2012   DM (diabetes mellitus) (HCC) 10/13/2011   Tremor 10/13/2011   GERD (gastroesophageal reflux disease) 10/13/2011   Depression 10/13/2011   Past Medical History:  Diagnosis Date   Asthma    Cataract    Phreesia 10/03/2020   Chronic lower back pain    Depression    Depression    Phreesia 10/03/2020   Diabetes mellitus without complication (HCC)    Phreesia 10/03/2020   Family history of adverse reaction to anesthesia    Mother has trouble coming out of anesthesia; patient reports she has no problems   Fibromyalgia    High cholesterol    Hypertension    Phreesia 10/03/2020   Presence of permanent cardiac pacemaker    Seizures (HCC) 11/2017   "day after she came home from hospital after having stroke" (01/01/2018)   Stroke (HCC) 12/06/2017   "speech issues since" (01/01/2018)   Type II diabetes mellitus [facility])    Past Surgical History:  Procedure Laterality  Date   CHOLECYSTECTOMY     EYE SURGERY N/A    Phreesia 10/03/2020   GAS/FLUID EXCHANGE Right 12/12/2019   Procedure: Gas/Fluid Exchange;  Surgeon: Carmela Rima, MD;  Location: [facility] OR;  Service: Ophthalmology;  Laterality: Right;   INSERT / REPLACE / REMOVE PACEMAKER  01/01/2018   IR CT HEAD LTD  12/06/2017   IR PERCUTANEOUS ART THROMBECTOMY/INFUSION INTRACRANIAL INC DIAG ANGIO  12/06/2017   LOOP RECORDER INSERTION N/A 12/12/2017   Procedure: LOOP RECORDER INSERTION;  Surgeon: Regan Lemming, MD;  Location: [facility] INVASIVE CV LAB;  Service: Cardiovascular;  Laterality: N/A;   LOOP RECORDER REMOVAL  01/01/2018  LOOP RECORDER REMOVAL N/A 01/01/2018   Procedure: LOOP RECORDER REMOVAL;  Surgeon: Regan Lemming, MD;  Location: [facility] INVASIVE CV LAB;  Service: Cardiovascular;  Laterality: N/A;   NECK SURGERY     PACEMAKER IMPLANT N/A 01/01/2018   Procedure: PACEMAKER IMPLANT;  Surgeon: Regan Lemming, MD;  Location: [facility] INVASIVE CV LAB;  Service: Cardiovascular;  Laterality: N/A;   PHOTOCOAGULATION Right 12/12/2019   Procedure: Photocoagulation;  Surgeon: Carmela Rima, MD;  Location: [facility] OR;  Service: Ophthalmology;  Laterality: Right;   RADIOLOGY WITH ANESTHESIA N/A 12/06/2017   Procedure: CODE STROKE;  Surgeon: Julieanne Cotton, MD;  Location: [facility] OR;  Service: Radiology;  Laterality: N/A;   SCLERAL BUCKLE WITH POSSIBLE 25 GAUGE PARS PLANA VITRECTOMY Right 12/12/2019   Procedure: SCLERAL BUCKLE WITH  PARS PLANA VITRECTOMY RETINAL DETACHMENT REPAIR;  Surgeon: Carmela Rima, MD;  Location: [facility] OR;  Service: Ophthalmology;  Laterality: Right;   TEE WITHOUT CARDIOVERSION N/A 12/10/2017   Procedure: TRANSESOPHAGEAL ECHOCARDIOGRAM (TEE);  Surgeon: Jake Bathe, MD;  Location: [facility] ENDOSCOPY;  Service: Cardiovascular;  Laterality: N/A;   THROMBECTOMY FEMORAL ARTERY Right 12/07/2017   Procedure: THROMBECTOMY RIGHT FEMORAL ARTERY;  Surgeon: Maeola Harman, MD;  Location[facility] OR;  Service:  Vascular;  Laterality: Right;   TONSILLECTOMY     No Known Allergies Prior to Admission medications   Medication Sig Start Date End Date Taking? Authorizing Provider  ACCU-CHEK GUIDE test strip USE AS DIRECTED FOUR TIMES DAILY 09/18/21  Yes Romero Belling, MD  [facility] P 0.1 % SOLN Apply to eye. 04/15/23  Yes [provider]  ALPHAGAN P 0.1 % SOLN Apply to eye. 12/18/22  Yes [provider]  B Complex-Biotin-FA (MULTI-B COMPLEX) CAPS Take one a day ,B complex  multivitamin over the counter or use prenatal vitamin. 08/28/16  Yes Dohmeier, Porfirio Mylar, MD  Blood Glucose Monitoring Suppl (ACCU-CHEK GUIDE ME) w/Device KIT 1 each by Does not apply route 4 (four) times daily. E11.9 05/22/20  Yes Romero Belling, MD  clopidogrel (PLAVIX) 75 MG tablet TAKE 1 TABLET(75 MG) BY MOUTH DAILY 10/03/23  Yes Shade Flood, MD  Continuous Blood Gluc Sensor (DEXCOM G7 SENSOR) MISC 1 Device by Does not apply route as directed. 04/02/22  Yes Shamleffer, Konrad Dolores, MD  insulin glargine (LANTUS SOLOSTAR) 100 UNIT/ML Solostar Pen Inject 22 Units into the skin daily. 10/03/23  Yes Shamleffer, Konrad Dolores, MD  Insulin Pen Needle (BD PEN NEEDLE NANO 2ND GEN) 32G X 4 MM MISC USE FOUR TIMES DAILY AS DIRECTED 05/26/23  Yes Shamleffer, Konrad Dolores, MD  irbesartan (AVAPRO) 150 MG tablet TAKE 1 TABLET(150 MG) BY MOUTH DAILY 10/03/23  Yes Shade Flood, MD  Lancets [facility] DELICA PLUS [place]) MISC USE FOUR TIMES DAILY 07/10/21  Yes Romero Belling, MD  levETIRAcetam (KEPPRA) 500 MG tablet Take 1 tablet (500 mg total) by mouth 2 (two) times daily. 10/22/23  Yes McCue, Shanda Bumps, NP  ofloxacin (FLOXIN) 0.3 % OTIC solution Place 5 drops into the right ear 2 (two) times daily. 09/12/23  Yes Schutt, Edsel Petrin, PA-C  primidone (MYSOLINE) 250 MG tablet Take 1 tablet (250 mg total) by mouth daily. 10/22/23  Yes McCue, Shanda Bumps, NP  rosuvastatin (CRESTOR) 20 MG tablet TAKE 1 TABLET(20 MG) BY MOUTH DAILY 07/14/23  Yes  Shade Flood, MD  sertraline (ZOLOFT) 100 MG tablet TAKE 2 TABLETS(200 MG) BY MOUTH AT BEDTIME 10/03/23  Yes Shade Flood, MD  timolol (TIMOPTIC) 0.5 % ophthalmic solution SMARTSIG:In Eye(s) 01/01/23  Yes  [provider]  tirzepatide Greggory Keen) 5 MG/0.5ML Pen Inject 5 mg into the skin once a week. 05/26/23  Yes Shamleffer, Konrad Dolores, MD  Insulin Aspart FlexPen (NOVOLOG) 100 UNIT/ML INJECT UNDER THE SKIN AS DIRECTED; MAX DAILY 30 UNITS PER SCALE Patient not taking: Reported on 11/12/2023 10/27/23   Shamleffer, Konrad Dolores, MD  LORazepam (ATIVAN) 0.5 MG tablet Take 0.5-1 tablets (0.25-0.5 mg total) by mouth 2 (two) times daily as needed for anxiety (anxiety and tremors (social situations)). Patient not taking: Reported on 11/12/2023 10/16/20   Ihor Austin, NP   Social History   Socioeconomic History   Marital status: Married    Spouse name: [name]   Number of children: 2   Years of education: 14   Highest education level: Associate degree: occupational, Scientist, product/process development, or vocational program  Occupational History   Not on file  Tobacco Use   Smoking status: Former    Current packs/day: 1.00    Average packs/day: 1 pack/day for 38.0 years (38.0 ttl pk-yrs)    Types: Cigarettes   Smokeless tobacco: Never   Tobacco comments:    Pt used chantix July till January, quit first of the year   Vaping Use   Vaping status: Never Used  Substance and Sexual Activity   Alcohol use: Yes    Comment: 01/01/2018 "nothing since stroke 11/2017"   Drug use: No   Sexual activity: Not Currently  Other Topics Concern   Not on file  Social History Narrative   Patient is married ([name]) and lives at home with her family   Patient has two children.   Patient works at [employer].   Patient has a college education.   Patient is right-handed.   Patient drinks 3 cups of coffee M-F.      Social Drivers of Corporate investment banker Strain: Low Risk  (11/12/2023)   Overall Financial  Resource Strain (CARDIA)    Difficulty of Paying Living Expenses: Not hard at all  Food Insecurity: No Food Insecurity (11/12/2023)   Hunger Vital Sign    Worried About Running Out of Food in the Last Year: Never true    Ran Out of Food in the Last Year: Never true  Transportation Needs: No Transportation Needs (11/12/2023)   PRAPARE - Administrator, Civil Service (Medical): No    Lack of Transportation (Non-Medical): No  Physical Activity: Not on file  Stress: No Stress Concern Present (11/12/2023)   [employer] of Occupational Health - Occupational Stress Questionnaire    Feeling of Stress : Not at all  Social Connections: Unknown (11/12/2023)   Social Connection and Isolation Panel [NHANES]    Frequency of Communication with Friends and Family: Never    Frequency of Social Gatherings with Friends and Family: Not on file    Attends Religious Services: Never    Database administrator or Organizations: No    Attends Engineer, structural: Not on file    Marital Status: Married  Catering manager Violence: Not on file    Review of Systems  Constitutional:  Negative for fatigue and unexpected weight change.  Respiratory:  Negative for chest tightness and shortness of breath.   Cardiovascular:  Negative for chest pain, palpitations and leg swelling.  Gastrointestinal:  Negative for abdominal pain and blood in stool.  Neurological:  Negative for dizziness, syncope, light-headedness and headaches.     Objective:   Vitals:   11/12/23 1345  BP: 128/70  Pulse: 72  Temp: 98 F (36.7 C)  TempSrc: Temporal  SpO2: 99%  Weight: 145 lb 6.4 oz (66 kg)  Height: 5\' 3"  (1.6 m)     Physical Exam Vitals reviewed.  Constitutional:      Appearance: Normal appearance. She is well-developed.  HENT:     Head: Normocephalic and atraumatic.  Eyes:     Conjunctiva/sclera: Conjunctivae normal.     Pupils: Pupils are equal, round, and reactive to light.  Neck:      Vascular: No carotid bruit.  Cardiovascular:     Rate and Rhythm: Normal rate and regular rhythm.     Heart sounds: Normal heart sounds.  Pulmonary:     Effort: Pulmonary effort is normal.     Breath sounds: Normal breath sounds.  Abdominal:     Palpations: Abdomen is soft. There is no pulsatile mass.     Tenderness: There is no abdominal tenderness.  Musculoskeletal:     Right lower leg: No edema.     Left lower leg: No edema.  Skin:    General: Skin is warm and dry.  Neurological:     Mental Status: She is alert and oriented to person, place, and time.  Psychiatric:        Mood and Affect: Mood normal.        Behavior: Behavior normal.       Assessment & Plan:  AVIKA CARBINE is a 63 y.o. female . History of CVA with residual deficit  -Recent neurology visit as above without med changes, tolerating current regimen, no new symptoms.  Continue follow-up as planned with specialist and medications above.  Hyperlipidemia LDL goal <70 - Plan: Lipid panel  -Current statin, check labs and adjust plan accordingly.  Essential hypertension, benign - Plan: Comprehensive metabolic panel  -Stable, at goal, no med changes.  Type 2 diabetes mellitus with other neurologic complication, with long-term current use of insulin (HCC) - Plan: Microalbumin / creatinine urine ratio, Hemoglobin A1c Hypoglycemia  -Weight has improved with Mounjaro.  Now with some hypoglycemia episodes.  She did stop the mealtime dosing of insulin, but I will decrease her Lantus by 4 units for now with continued close monitoring and will forward note to her endocrinologist.  Advised to contact their office again to schedule appointment.  No change in Mounjaro dose for now.  RTC precautions if persistent hypoglycemia and hypoglycemia precautions, handout given on treatment options if that were to occur.  Need for shingles vaccine - Plan: Varicella-zoster vaccine IM given.  Tobacco use Cessation discussed.  Handout  given.  No orders of the defined types were placed in this encounter.  Patient Instructions  I will check some labs, cut back on lantus to 18 units for now and if any further low blood sugars let us know. We will try to get you back into your endocrinology. I will check labs today. Try to call endocrinology for appointment again.   No other med changes today.   Is very important that you quit smoking, especially with history of stroke.  Please let me know if I can help and see information below on steps to quit smoking.  Let me know if you change your mind about colon cancer screening flu vaccine or lung cancer screening.  Thank you for coming in today and take care  Steps to Quit Smoking Smoking tobacco is the leading cause of preventable death. It can affect almost every organ in the body. Smoking puts you and those around you  at risk for developing many serious chronic diseases. Quitting smoking can be very challenging. Do not get discouraged if you are not successful the first time. Some people need to make many attempts to quit before they achieve long-term success. Do your best to stick to your quit plan, and talk with your health care provider if you have any questions or concerns. How do I get ready to quit? When you decide to quit smoking, create a plan to help you succeed. Before you quit: Pick a date to quit. Set a date within the next 2 weeks to give you time to prepare. Write down the reasons why you are quitting. Keep this list in places where you will see it often. Tell your family, friends, and co-workers that you are quitting. Support from people you are close to can make quitting easier. Talk with your health care provider about your options for quitting smoking. Find out what treatment options are covered by your health insurance. Identify people, places, things, and activities that make you want to smoke (triggers). Avoid them. What first steps can I take to quit  smoking? Throw away all cigarettes at home, at work, and in your car. Throw away smoking accessories, such as Set designer. Clean your car. Make sure to empty the ashtray. Clean your home, including curtains and carpets. What strategies can I use to quit smoking? Talk with your health care provider about combining strategies, such as taking medicines while you are also receiving in-person counseling. Using these two strategies together makes you more likely to succeed in quitting than if you used either strategy on its own. If you are pregnant or breastfeeding, talk with your health care provider about finding counseling or other support strategies to quit smoking. Do not take medicine to help you quit smoking unless your health care provider tells you to. Quit right away Quit smoking completely, instead of gradually reducing how much you smoke over a period of time. Stopping smoking right away may be more successful than gradually quitting. Attend in-person counseling to help you build problem-solving skills. You are more likely to succeed in quitting if you attend counseling sessions regularly. Even short sessions of 10 minutes can be effective. Take medicine You may take medicines to help you quit smoking. Some medicines require a prescription. You can also purchase over-the-counter medicines. Medicines may have nicotine in them to replace the nicotine in cigarettes. Medicines may: Help to stop cravings. Help to relieve withdrawal symptoms. Your health care provider may recommend: Nicotine patches, gum, or lozenges. Nicotine inhalers or sprays. Non-nicotine medicine that you take by mouth. Find resources Find resources and support systems that can help you quit smoking and remain smoke-free after you quit. These resources are most helpful when you use them often. They include: Online chats with a Veterinary surgeon. Telephone quitlines. Printed Materials engineer. Support groups or group  counseling. Text messaging programs. Mobile phone apps or applications. Use apps that can help you stick to your quit plan by providing reminders, tips, and encouragement. Examples of free services include Quit Guide from the CDC and smokefree.gov  What can I do to make it easier to quit?  Reach out to your family and friends for support and encouragement. Call telephone quitlines, such as 1-800-QUIT-NOW, reach out to support groups, or work with a counselor for support. Ask people who smoke to avoid smoking around you. Avoid places that trigger you to smoke, such as bars, parties, or smoke-break areas at work. Spend time  with people who do not smoke. Lessen the stress in your life. Stress can be a smoking trigger for some people. To lessen stress, try: Exercising regularly. Doing deep-breathing exercises. Doing yoga. Meditating. What benefits will I see if I quit smoking? Over time, you should start to see positive results, such as: Improved sense of smell and taste. Decreased coughing and sore throat. Slower heart rate. Lower blood pressure. Clearer and healthier skin. The ability to breathe more easily. Fewer sick days. Summary Quitting smoking can be very challenging. Do not get discouraged if you are not successful the first time. Some people need to make many attempts to quit before they achieve long-term success. When you decide to quit smoking, create a plan to help you succeed. Quit smoking right away, not slowly over a period of time. Find resources and support systems that can help you quit smoking and remain smoke-free after you quit. This information is not intended to replace advice given to you by your health care provider. Make sure you discuss any questions you have with your health care provider. Document Revised: 08/17/2021 Document Reviewed: 08/17/2021 Elsevier Patient Education  2024 Elsevier Inc.   Hypoglycemia Hypoglycemia is when the amount of sugar, or  glucose, in your blood is too low. Low blood sugar can happen if you have diabetes or if you don't have diabetes. It may be an emergency. What are the causes? Low blood sugar happens most often in people who have diabetes. It may be caused by: Diabetes medicine. Not eating enough, or not eating often enough. Being more active than normal. If you don't have diabetes, you may still get low blood sugar if: There's a tumor in your pancreas. A tumor is a growth of cells that isn't normal. You don't eat enough, or you fast. Fasting is when you don't eat for long periods at a time. You have a bad infection or illness. You have problems after weight loss surgery. You have kidney or liver problems. You take certain medicines. What increases the risk? You're more likely to have low blood sugar if: You have diabetes and take medicine for it. You drink a lot of alcohol. You get sick. What are the signs or symptoms? Mild Hunger or feeling like you may vomit. Sweating and feeling cold to the touch. Feeling dizzy or light-headed. Being sleepy or having trouble sleeping. A headache. Blurry vision. Mood changes. These include feeling worried, nervous, or easily annoyed. Moderate Feeling confused. Changes in the way you act. Weakness. An uneven heartbeat. Very bad Having very low blood sugar is an emergency. It can cause: Fainting. Seizures. A coma. Death. How is this diagnosed?  Low blood sugar can be found with a blood test. This test tells you how much sugar is in your blood. It's done while you're having symptoms. Your health care provider may also do an exam and look at your medical history. How is this treated? Treating low blood sugar If you have low blood sugar, eat or drink something with sugar in it right away. The food or drink should have 15 grams of a fast-acting carbohydrate (carb). Options include: 4 oz (120 mL) of fruit juice. 4 oz (120 mL) of soda (not diet soda). A few  pieces of hard candy. Check food labels to see how many pieces to eat. 1 Tbsp (15 mL) of sugar or honey. 4 glucose tablets. 1 tube of glucose gel. Treating low blood sugar if you have diabetes Talk with your provider about how  much carb you should take. If you're alert and can swallow safely, you may follow the 15:15 rule: Take 15 grams of a fast-acting carb. Check your blood sugar 15 minutes after you take the carb. If your blood sugar is still at or below 70 mg/dL (3.9 mmol/L), take 15 grams of a carb again. If your blood sugar doesn't go above 70 mg/dL (3.9 mmol/L) after 3 tries, get help right away. After your blood sugar goes back to normal, eat a meal or a snack within 1 hour. Always keep 15 grams of a fast-acting carb with you. This could be: 4 glucose tablets. A few pieces of hard candy. 1 Tbsp (15 mL) of honey or sugar. 1 tube of glucose gel. Treating very low blood sugar If your blood sugar is less than 54 mg/dL (3 mmol/L), it's an emergency. Get help right away. If you can't eat or drink, you will need to be given glucagon. A family member or friend should learn how to check your blood sugar and give you glucagon. Ask your provider if you should keep a glucagon kit at home. You may also need to be treated in a hospital. Follow these instructions at home: If you have diabetes: Always keep a fast-acting carb (15 grams) with you. Follow your diabetes care plan. Make sure you: Know the symptoms of low blood sugar. Check your blood sugar as often as told. Always check it before and after you exercise. Always check your blood sugar before you drive. Take your medicines as told. Eat on time. Do not skip meals. Share your diabetes care plan with: Your work or school. The people you live with. Wear an alert bracelet or carry a card that says you have diabetes. General instructions If you drink alcohol: Limit how much you have to: 0-1 drink a day if you're female. 0-2 drinks a  day if you're female. Know how much alcohol is in your drink. In the [place] one drink is one 12 oz bottle of beer (355 mL), one 5 oz glass of wine (148 mL), or one 1 oz glass of hard liquor (44 mL). Be sure to eat food when you drink alcohol. Be sure to check your blood sugar after you drink. Alcohol may lead to low blood sugar later. Where to find more information American Diabetes Association (ADA): diabetes.org Contact a health care provider if: You have low blood sugar often. You have diabetes and are having trouble keeping your blood sugar in the right range. Get help right away if: You can't get your blood sugar above 70 mg/dL (3.9 mmol/L) after 3 tries. Your blood sugar is below 54 mg/dL (3 mmol/L). You have a seizure. You faint. These symptoms may be an emergency. Call 911 right away. Do not wait to see if the symptoms will go away. Do not drive yourself to the hospital. This information is not intended to replace advice given to you by your health care provider. Make sure you discuss any questions you have with your health care provider. Document Revised: 05/29/2023 Document Reviewed: 11/13/2022 Elsevier Patient Education  2024 Elsevier Inc.      Signed,   Meredith Staggers, MD [facility], [facility] 11/12/23 2:44 PM

## 2023-11-12 NOTE — Progress Notes (Signed)
 Remote pacemaker transmission.

## 2023-11-12 NOTE — Addendum Note (Signed)
 Addended by: Geralyn Flash D on: 11/12/2023 10:08 AM   Modules accepted: Orders

## 2023-11-12 NOTE — Patient Instructions (Addendum)
 I will check some labs, cut back on lantus to 18 units for now and if any further low blood sugars let us know. We will try to get you back into your endocrinology. I will check labs today. Try to call endocrinology for appointment again.   No other med changes today.   Is very important that you quit smoking, especially with history of stroke.  Please let me know if I can help and see information below on steps to quit smoking.  Let me know if you change your mind about colon cancer screening flu vaccine or lung cancer screening.  Thank you for coming in today and take care  Steps to Quit Smoking Smoking tobacco is the leading cause of preventable death. It can affect almost every organ in the body. Smoking puts you and those around you at risk for developing many serious chronic diseases. Quitting smoking can be very challenging. Do not get discouraged if you are not successful the first time. Some people need to make many attempts to quit before they achieve long-term success. Do your best to stick to your quit plan, and talk with your health care provider if you have any questions or concerns. How do I get ready to quit? When you decide to quit smoking, create a plan to help you succeed. Before you quit: Pick a date to quit. Set a date within the next 2 weeks to give you time to prepare. Write down the reasons why you are quitting. Keep this list in places where you will see it often. Tell your family, friends, and co-workers that you are quitting. Support from people you are close to can make quitting easier. Talk with your health care provider about your options for quitting smoking. Find out what treatment options are covered by your health insurance. Identify people, places, things, and activities that make you want to smoke (triggers). Avoid them. What first steps can I take to quit smoking? Throw away all cigarettes at home, at work, and in your car. Throw away smoking accessories,  such as Set designer. Clean your car. Make sure to empty the ashtray. Clean your home, including curtains and carpets. What strategies can I use to quit smoking? Talk with your health care provider about combining strategies, such as taking medicines while you are also receiving in-person counseling. Using these two strategies together makes you more likely to succeed in quitting than if you used either strategy on its own. If you are pregnant or breastfeeding, talk with your health care provider about finding counseling or other support strategies to quit smoking. Do not take medicine to help you quit smoking unless your health care provider tells you to. Quit right away Quit smoking completely, instead of gradually reducing how much you smoke over a period of time. Stopping smoking right away may be more successful than gradually quitting. Attend in-person counseling to help you build problem-solving skills. You are more likely to succeed in quitting if you attend counseling sessions regularly. Even short sessions of 10 minutes can be effective. Take medicine You may take medicines to help you quit smoking. Some medicines require a prescription. You can also purchase over-the-counter medicines. Medicines may have nicotine in them to replace the nicotine in cigarettes. Medicines may: Help to stop cravings. Help to relieve withdrawal symptoms. Your health care provider may recommend: Nicotine patches, gum, or lozenges. Nicotine inhalers or sprays. Non-nicotine medicine that you take by mouth. Find resources Find resources and support systems that  can help you quit smoking and remain smoke-free after you quit. These resources are most helpful when you use them often. They include: Online chats with a Veterinary surgeon. Telephone quitlines. Printed Materials engineer. Support groups or group counseling. Text messaging programs. Mobile phone apps or applications. Use apps that can help you  stick to your quit plan by providing reminders, tips, and encouragement. Examples of free services include Quit Guide from the CDC and smokefree.gov  What can I do to make it easier to quit?  Reach out to your family and friends for support and encouragement. Call telephone quitlines, such as 1-800-QUIT-NOW, reach out to support groups, or work with a counselor for support. Ask people who smoke to avoid smoking around you. Avoid places that trigger you to smoke, such as bars, parties, or smoke-break areas at work. Spend time with people who do not smoke. Lessen the stress in your life. Stress can be a smoking trigger for some people. To lessen stress, try: Exercising regularly. Doing deep-breathing exercises. Doing yoga. Meditating. What benefits will I see if I quit smoking? Over time, you should start to see positive results, such as: Improved sense of smell and taste. Decreased coughing and sore throat. Slower heart rate. Lower blood pressure. Clearer and healthier skin. The ability to breathe more easily. Fewer sick days. Summary Quitting smoking can be very challenging. Do not get discouraged if you are not successful the first time. Some people need to make many attempts to quit before they achieve long-term success. When you decide to quit smoking, create a plan to help you succeed. Quit smoking right away, not slowly over a period of time. Find resources and support systems that can help you quit smoking and remain smoke-free after you quit. This information is not intended to replace advice given to you by your health care provider. Make sure you discuss any questions you have with your health care provider. Document Revised: 08/17/2021 Document Reviewed: 08/17/2021 Elsevier Patient Education  2024 Elsevier Inc.   Hypoglycemia Hypoglycemia is when the amount of sugar, or glucose, in your blood is too low. Low blood sugar can happen if you have diabetes or if you don't have  diabetes. It may be an emergency. What are the causes? Low blood sugar happens most often in people who have diabetes. It may be caused by: Diabetes medicine. Not eating enough, or not eating often enough. Being more active than normal. If you don't have diabetes, you may still get low blood sugar if: There's a tumor in your pancreas. A tumor is a growth of cells that isn't normal. You don't eat enough, or you fast. Fasting is when you don't eat for long periods at a time. You have a bad infection or illness. You have problems after weight loss surgery. You have kidney or liver problems. You take certain medicines. What increases the risk? You're more likely to have low blood sugar if: You have diabetes and take medicine for it. You drink a lot of alcohol. You get sick. What are the signs or symptoms? Mild Hunger or feeling like you may vomit. Sweating and feeling cold to the touch. Feeling dizzy or light-headed. Being sleepy or having trouble sleeping. A headache. Blurry vision. Mood changes. These include feeling worried, nervous, or easily annoyed. Moderate Feeling confused. Changes in the way you act. Weakness. An uneven heartbeat. Very bad Having very low blood sugar is an emergency. It can cause: Fainting. Seizures. A coma. Death. How is this diagnosed?  Low blood sugar can be found with a blood test. This test tells you how much sugar is in your blood. It's done while you're having symptoms. Your health care provider may also do an exam and look at your medical history. How is this treated? Treating low blood sugar If you have low blood sugar, eat or drink something with sugar in it right away. The food or drink should have 15 grams of a fast-acting carbohydrate (carb). Options include: 4 oz (120 mL) of fruit juice. 4 oz (120 mL) of soda (not diet soda). A few pieces of hard candy. Check food labels to see how many pieces to eat. 1 Tbsp (15 mL) of sugar or  honey. 4 glucose tablets. 1 tube of glucose gel. Treating low blood sugar if you have diabetes Talk with your provider about how much carb you should take. If you're alert and can swallow safely, you may follow the 15:15 rule: Take 15 grams of a fast-acting carb. Check your blood sugar 15 minutes after you take the carb. If your blood sugar is still at or below 70 mg/dL (3.9 mmol/L), take 15 grams of a carb again. If your blood sugar doesn't go above 70 mg/dL (3.9 mmol/L) after 3 tries, get help right away. After your blood sugar goes back to normal, eat a meal or a snack within 1 hour. Always keep 15 grams of a fast-acting carb with you. This could be: 4 glucose tablets. A few pieces of hard candy. 1 Tbsp (15 mL) of honey or sugar. 1 tube of glucose gel. Treating very low blood sugar If your blood sugar is less than 54 mg/dL (3 mmol/L), it's an emergency. Get help right away. If you can't eat or drink, you will need to be given glucagon. A family member or friend should learn how to check your blood sugar and give you glucagon. Ask your provider if you should keep a glucagon kit at home. You may also need to be treated in a hospital. Follow these instructions at home: If you have diabetes: Always keep a fast-acting carb (15 grams) with you. Follow your diabetes care plan. Make sure you: Know the symptoms of low blood sugar. Check your blood sugar as often as told. Always check it before and after you exercise. Always check your blood sugar before you drive. Take your medicines as told. Eat on time. Do not skip meals. Share your diabetes care plan with: Your work or school. The people you live with. Wear an alert bracelet or carry a card that says you have diabetes. General instructions If you drink alcohol: Limit how much you have to: 0-1 drink a day if you're female. 0-2 drinks a day if you're female. Know how much alcohol is in your drink. In the [place] one drink is one 12 oz  bottle of beer (355 mL), one 5 oz glass of wine (148 mL), or one 1 oz glass of hard liquor (44 mL). Be sure to eat food when you drink alcohol. Be sure to check your blood sugar after you drink. Alcohol may lead to low blood sugar later. Where to find more information American Diabetes Association (ADA): diabetes.org Contact a health care provider if: You have low blood sugar often. You have diabetes and are having trouble keeping your blood sugar in the right range. Get help right away if: You can't get your blood sugar above 70 mg/dL (3.9 mmol/L) after 3 tries. Your blood sugar is below 54 mg/dL (3  mmol/L). You have a seizure. You faint. These symptoms may be an emergency. Call 911 right away. Do not wait to see if the symptoms will go away. Do not drive yourself to the hospital. This information is not intended to replace advice given to you by your health care provider. Make sure you discuss any questions you have with your health care provider. Document Revised: 05/29/2023 Document Reviewed: 11/13/2022 Elsevier Patient Education  2024 [employer].

## 2023-11-13 ENCOUNTER — Other Ambulatory Visit: Payer: Self-pay | Admitting: Family Medicine

## 2023-11-13 ENCOUNTER — Telehealth: Payer: Self-pay | Admitting: Family Medicine

## 2023-11-13 ENCOUNTER — Encounter: Payer: Self-pay | Admitting: Family Medicine

## 2023-11-13 DIAGNOSIS — E876 Hypokalemia: Secondary | ICD-10-CM

## 2023-11-13 LAB — LIPID PANEL
Cholesterol: 111 mg/dL (ref 0–200)
HDL: 41.8 mg/dL (ref >39.00)
LDL Cholesterol: 48 mg/dL (ref 0–99)
NonHDL: 69.17
Total CHOL/HDL Ratio: 3
Triglycerides: 105 mg/dL (ref 0.0–149.0)
VLDL: 21 mg/dL (ref 0.0–40.0)

## 2023-11-13 LAB — COMPREHENSIVE METABOLIC PANEL
ALT: 9 U/L (ref 0–35)
AST: 13 U/L (ref 0–37)
Albumin: 4.1 g/dL (ref 3.5–5.2)
Alkaline Phosphatase: 85 U/L (ref 39–117)
BUN: 9 mg/dL (ref 6–23)
CO2: 30 meq/L (ref 19–32)
Calcium: 9.3 mg/dL (ref 8.4–10.5)
Chloride: 103 meq/L (ref 96–112)
Creatinine, Ser: 0.56 mg/dL (ref 0.40–1.20)
GFR: 96.38 mL/min (ref >60.00)
Glucose, Bld: 117 mg/dL — ABNORMAL HIGH (ref 70–99)
Potassium: 3.1 meq/L — ABNORMAL LOW (ref 3.5–5.1)
Sodium: 140 meq/L (ref 135–145)
Total Bilirubin: 0.3 mg/dL (ref 0.2–1.2)
Total Protein: 6.9 g/dL (ref 6.0–8.3)

## 2023-11-13 LAB — MICROALBUMIN / CREATININE URINE RATIO
Creatinine,U: 167.9 mg/dL
Microalb Creat Ratio: 8.9 mg/g (ref 0.0–30.0)
Microalb, Ur: 1.5 mg/dL (ref 0.0–1.9)

## 2023-11-13 LAB — HEMOGLOBIN A1C: Hgb A1c MFr Bld: 6.1 % (ref 4.6–6.5)

## 2023-11-13 MED ORDER — POTASSIUM CHLORIDE CRYS ER 20 MEQ PO TBCR: 20.0000 meq | EXTENDED_RELEASE_TABLET | Freq: Every day | ORAL | 1 refills | Status: DC

## 2023-11-13 NOTE — Progress Notes (Signed)
 See lab notes

## 2023-11-13 NOTE — Telephone Encounter (Signed)
 Called Pt left message to schedule appt.

## 2023-11-14 ENCOUNTER — Other Ambulatory Visit

## 2023-11-14 ENCOUNTER — Other Ambulatory Visit: Payer: Self-pay | Admitting: Family Medicine

## 2023-11-14 DIAGNOSIS — I10 Essential (primary) hypertension: Secondary | ICD-10-CM

## 2023-11-14 DIAGNOSIS — I6930 Unspecified sequelae of cerebral infarction: Secondary | ICD-10-CM

## 2023-11-17 ENCOUNTER — Other Ambulatory Visit (INDEPENDENT_AMBULATORY_CARE_PROVIDER_SITE_OTHER)

## 2023-11-17 ENCOUNTER — Ambulatory Visit: Admitting: Internal Medicine

## 2023-11-17 ENCOUNTER — Encounter: Payer: Self-pay | Admitting: Internal Medicine

## 2023-11-17 ENCOUNTER — Other Ambulatory Visit

## 2023-11-17 DIAGNOSIS — Z794 Long term (current) use of insulin: Secondary | ICD-10-CM | POA: Diagnosis not present

## 2023-11-17 DIAGNOSIS — E1149 Type 2 diabetes mellitus with other diabetic neurological complication: Secondary | ICD-10-CM | POA: Diagnosis not present

## 2023-11-17 DIAGNOSIS — E876 Hypokalemia: Secondary | ICD-10-CM

## 2023-11-17 LAB — BASIC METABOLIC PANEL
BUN: 10 mg/dL (ref 6–23)
CO2: 28 meq/L (ref 19–32)
Calcium: 9.1 mg/dL (ref 8.4–10.5)
Chloride: 106 meq/L (ref 96–112)
Creatinine, Ser: 0.58 mg/dL (ref 0.40–1.20)
GFR: 95.56 mL/min (ref >60.00)
Glucose, Bld: 122 mg/dL — ABNORMAL HIGH (ref 70–99)
Potassium: 3.6 meq/L (ref 3.5–5.1)
Sodium: 142 meq/L (ref 135–145)

## 2023-11-17 MED ORDER — TIRZEPATIDE 5 MG/0.5ML ~~LOC~~ SOAJ: 5.0000 mg | SUBCUTANEOUS | 3 refills | Status: DC

## 2023-11-17 NOTE — Patient Instructions (Signed)
 Continue Mounjaro 5 mg once weekly  Stop Lantus      HOW TO TREAT LOW BLOOD SUGARS (Blood sugar LESS THAN 70 MG/DL) Please follow the RULE OF 15 for the treatment of hypoglycemia treatment (when your (blood sugars are less than 70 mg/dL)   STEP 1: Take 15 grams of carbohydrates when your blood sugar is low, which includes:  3-4 GLUCOSE TABS  OR 3-4 OZ OF JUICE OR REGULAR SODA OR ONE TUBE OF GLUCOSE GEL    STEP 2: RECHECK blood sugar in 15 MINUTES STEP 3: If your blood sugar is still low at the 15 minute recheck --> then, go back to STEP 1 and treat AGAIN with another 15 grams of carbohydrates.

## 2023-11-17 NOTE — Progress Notes (Signed)
 Name: Brittany [name]  Age/ Sex: 63 y.o., female   MRN/ DOB: [id number], 09-Jan-1961     PCP: Shade Flood, MD   Reason for Endocrinology Evaluation: Type 2 Diabetes Mellitus  Initial Endocrine Consultative Visit: 05/04/2018    PATIENT IDENTIFIER: Brittany [name] is a 63 y.o. female with a past medical history of T2DM, HTN, fibromyalgia and dyslipidemia. The patient has followed with Endocrinology clinic since 05/04/2018 for consultative assistance with management of her diabetes.  DIABETIC HISTORY:  Brittany [name] was diagnosed with DM 1999, and started insulin therapy in 2019. Her hemoglobin A1c has ranged from 6.6% in 2013, peaking at >14.0% in 2018.    Was last seen by Dr. Everardo All in March 2023  Switched Trulicity to [facility]  05/2023  Novolog discontinued  10/2023 due to hypoglycemia  Lantus discontinued 11/2023 with an A1c 6.1%   SUBJECTIVE:   During the last visit (05/26/2023): A1c 9.0%     Today (11/17/2023): Brittany [name] is here for a follow up on diabetes management.  She checks her blood sugars multiple  times daily, through CGM. The patient has had hypoglycemic episodes since the last clinic visit. She is symptomatic with these episodes.    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 She was recently evaluated by her PCP, patient was advised to hold off on Lantus over the weekend due to hypoglycemia  Denies constipation or diarrhea  Denies nausea or vomiting   HOME DIABETES REGIMEN:  Mounjaro 5 mg weekly Lantus 18 units daily - on hold  NovoLog 5/8/8 units with meals - not taking  correction scale: NovoLog (BG -130/40)    Statin: yes ACE-I/ARB: Yes    CONTINUOUS GLUCOSE MONITORING RECORD INTERPRETATION :   Sensor description:dexcom G7  Dates of Recording: 2/25-3/06/2024  Results statistics:   CGM use % of time 95  Average and SD 94/31  Time in range    75    %  % Time Above 180 1  % Time above 250 0  % Time Below target 15    Glycemic patterns summary: Patient has been noted with hypoglycemia overnight with optimal BG's throughout the day  Hyperglycemic episodes where, postprandial  Hypoglycemic episodes occurred all night  Overnight periods: Trends down   DIABETIC COMPLICATIONS: Microvascular complications:   Denies: CKD Last Eye Exam: Completed 10/2022  Macrovascular complications:  CVA Denies: CAD, PVD   HISTORY:  Past Medical History:  Past Medical History:  Diagnosis Date   Asthma    Cataract    Phreesia 10/03/2020   Chronic lower back pain    Depression    Depression    Phreesia 10/03/2020   Diabetes mellitus without complication (HCC)    Phreesia 10/03/2020   Family history of adverse reaction to anesthesia    Mother has trouble coming out of anesthesia; patient reports she has no problems   Fibromyalgia    High cholesterol    Hypertension    Phreesia 10/03/2020   Presence of permanent cardiac pacemaker    Seizures (HCC) 11/2017   "day after she came home from hospital after having stroke" (01/01/2018)   Stroke (HCC) 12/06/2017   "speech issues since" (01/01/2018)   Type II diabetes mellitus (HCC)    Past Surgical History:  Past Surgical History:  Procedure Laterality Date   CHOLECYSTECTOMY     EYE SURGERY N/A    Phreesia 10/03/2020   GAS/FLUID EXCHANGE Right 12/12/2019   Procedure: Gas/Fluid Exchange;  Surgeon: Allena Katz,  Heide Spark, MD;  Location: [facility] OR;  Service: Ophthalmology;  Laterality: Right;   INSERT / REPLACE / REMOVE PACEMAKER  01/01/2018   IR CT HEAD LTD  12/06/2017   IR PERCUTANEOUS ART THROMBECTOMY/INFUSION INTRACRANIAL INC DIAG ANGIO  12/06/2017   LOOP RECORDER INSERTION N/A 12/12/2017   Procedure: LOOP RECORDER INSERTION;  Surgeon: Regan Lemming, MD;  [facility] INV[facility];  Service: Cardiovascular;  Laterality: N/A;   LOOP RECORDER REMOVAL  01/01/2018   LOOP RECORDER REMOVAL N/A 01/01/2018   Procedure: LOOP RECORDER REMOVAL;  Surgeon: Regan Lemming, MD;  [facility]: [facility] INVAS[facility] LAB;  Service: Cardiovascular;  Laterality: N/A;   NECK SURGERY     PACEMAKER IMPLANT N/A 01/01/2018   Procedure: PACEMAKER IMPLANT;  Surgeon: Regan Lemming, MD;  [facility];  Service: Cardiovascular;  Laterality: N/A;   PHOTOCOAGULATION Right 12/12/2019   Procedure: Photocoagulation;  Surgeon: Carmela Rima, MD;  Location: [facility] OR;  Service: Ophthalmology;  Laterality: Right;   RADIOLOGY WITH ANESTHESIA N/A 12/06/2017   Procedure: CODE STROKE;  Surgeon: Julieanne Cotton, MD;  [facility] OR;  Service: Radiology;  Laterality: N/A;   SCLERAL BUCKLE WITH POSSIBLE 25 GAUGE PARS PLANA VITRECTOMY Right 12/12/2019   Procedure: SCLERAL BUCKLE WITH  PARS PLANA VITRECTOMY RETINAL DETACHMENT REPAIR;  Surgeon: Carmela Rima, MD;  Location: [facility] OR;  Service: Ophthalmology;  Laterality: Right;   TEE WITHOUT CARDIOVERSION N/A 12/10/2017   Procedure: TRANSESOPHAGEAL ECHOCARDIOGRAM (TEE);  Surgeon: Jake Bathe, MD;  Location: [facility] ENDOSCOPY;  Service: Cardiovascular;  Laterality: N/A;   THROMBECTOMY FEMORAL ARTERY Right 12/07/2017   Procedure: THROMBECTOMY RIGHT FEMORAL ARTERY;  Surgeon: Maeola Harman, MD;  Location: [facility] OR;  Service: Vascular;  Laterality: Right;   TONSILLECTOMY     Social History:  reports that she has quit smoking. Her smoking use included cigarettes. She has a 38 pack-year smoking history. She has never used smokeless tobacco. She reports current alcohol use. She reports that she does not use drugs. Family History:  Family History  Problem Relation Age of Onset   Cancer Mother    Diabetes Mother    Heart disease Mother    Parkinsonism Mother    Diabetes Father    Heart disease Father    Dementia Father    COPD Maternal Aunt    Cancer Maternal Grandmother 9       lung   Breast cancer Neg Hx    Stroke Neg Hx      HOME MEDICATIONS: Allergies as of 11/17/2023   No Known Allergies      Medication List         Accurate as of November 17, 2023 11:48 AM. If you have any questions, ask your nurse or doctor.          Accu-Chek Guide Me w/Device Kit 1 each by Does not apply route 4 (four) times daily. E11.9   Accu-Chek Guide test strip Generic drug: glucose blood USE AS DIRECTED FOUR TIMES DAILY   Alphagan P 0.1 % Soln Generic drug: brimonidine Apply to eye.   Alphagan P 0.1 % Soln Generic drug: brimonidine Apply to eye.   BD Pen Needle Nano 2nd Gen 32G X 4 MM Misc Generic drug: Insulin Pen Needle USE FOUR TIMES DAILY AS DIRECTED   clopidogrel 75 MG tablet Commonly known as: PLAVIX TAKE 1 TABLET(75 MG) BY MOUTH DAILY   Dexcom G7 Sensor Misc 1 Device by Does not apply route as directed.   Insulin  Aspart FlexPen 100 UNIT/ML Commonly known as: NOVOLOG INJECT UNDER THE SKIN AS DIRECTED; MAX DAILY 30 UNITS PER SCALE   irbesartan 150 MG tablet Commonly known as: AVAPRO TAKE 1 TABLET(150 MG) BY MOUTH DAILY   Lantus SoloStar 100 UNIT/ML Solostar Pen Generic drug: insulin glargine Inject 22 Units into the skin daily. What changed: how much to take   levETIRAcetam 500 MG tablet Commonly known as: KEPPRA Take 1 tablet (500 mg total) by mouth 2 (two) times daily.   LORazepam 0.5 MG tablet Commonly known as: Ativan Take 0.5-1 tablets (0.25-0.5 mg total) by mouth 2 (two) times daily as needed for anxiety (anxiety and tremors (social situations)).   Multi-B Complex Caps Take one a day ,B complex  multivitamin over the counter or use prenatal vitamin.   ofloxacin 0.3 % OTIC solution Commonly known as: FLOXIN Place 5 drops into the right ear 2 (two) times daily.   OneTouch Delica Plus Lancet33G Misc USE FOUR TIMES DAILY   potassium chloride SA 20 MEQ tablet Commonly known as: KLOR-CON M Take 1 tablet (20 mEq total) by mouth daily. Take 2 ( ) on day 1.   primidone 250 MG tablet Commonly known as: MYSOLINE Take 1 tablet (250 mg total) by mouth daily.   rosuvastatin  20 MG tablet Commonly known as: CRESTOR TAKE 1 TABLET(20 MG) BY MOUTH DAILY   sertraline 100 MG tablet Commonly known as: ZOLOFT TAKE 2 TABLETS(200 MG) BY MOUTH AT BEDTIME   timolol 0.5 % ophthalmic solution Commonly known as: TIMOPTIC SMARTSIG:In Eye(s)   tirzepatide 5 MG/0.5ML Pen Commonly known as: MOUNJARO Inject 5 mg into the skin once a week.         OBJECTIVE:   Vital Signs: BP 124/82 (BP Location: Left Arm, Patient Position: Sitting, Cuff Size: Small)   Pulse 71   Ht 5\' 3"  (1.6 m)   Wt 143 lb (64.9 kg)   SpO2 98%   BMI 25.33 kg/m   Wt Readings from Last 3 Encounters:  11/17/23 143 lb (64.9 kg)  11/12/23 145 lb 6.4 oz (66 kg)  10/22/23 151 lb (68.5 kg)     Exam: General: Pt appears well and is in NAD  Lungs: Clear with good BS bilat   Heart: RRR   Neuro: MS is good with appropriate affect, pt is alert and Ox3    DM foot exam: 05/26/2023  The skin of the feet is intact without sores or ulcerations. The pedal pulses are 2+ on right and 2+ on left. The sensation is intact to a screening 5.07, 10 gram monofilament bilaterally        DATA REVIEWED:  Lab Results  Component Value Date   HGBA1C 6.1 11/12/2023   HGBA1C 9.0 (A) 05/26/2023   HGBA1C 6.7 (H) 11/11/2022    Latest Reference Range & Units 11/12/23 14:56  Sodium 135 - 145 mEq/L 140  Potassium 3.5 - 5.1 mEq/L 3.1 (L)  Chloride 96 - 112 mEq/L 103  CO2 19 - 32 mEq/L 30  Glucose 70 - 99 mg/dL 161 (H)  BUN 6 - 23 mg/dL 9  Creatinine 0.96 - 0.45 mg/dL 4.09  Calcium 8.4 - 81.1 mg/dL 9.3  Alkaline Phosphatase 39 - 117 U/L 85  Albumin 3.5 - 5.2 g/dL 4.1  AST 0 - 37 U/L 13  ALT 0 - 35 U/L 9  Total Protein 6.0 - 8.3 g/dL 6.9    Latest Reference Range & Units 11/12/23 14:56  Total CHOL/HDL Ratio  3  Cholesterol 0 -  200 mg/dL 034  HDL Cholesterol >74.25 mg/dL 95.63  LDL (calc) 0 - 99 mg/dL 48  MICROALB/CREAT RATIO 0.0 - 30.0 mg/g 8.9  NonHDL  69.17  Triglycerides 0.0 - 149.0 mg/dL 875.6   VLDL 0.0 - 43.3 mg/dL 29.5    Latest Reference Range & Units 11/12/23 14:56  Creatinine,U mg/dL 188.4  Microalb, Ur 0.0 - 1.9 mg/dL 1.5  MICROALB/CREAT RATIO 0.0 - 30.0 mg/g 8.9     Old records , labs and images have been reviewed.     ASSESSMENT / PLAN / RECOMMENDATIONS:   1) Type 2 Diabetes Mellitus, Optimally  controlled, With Macrovascular complications - Most recent A1c of 6.1 %. Goal A1c < 7.0 %.     -A1c has trended down to 6.1%, down from 9.0% -Barriers to diabetes self-care is history of CVA and memory issues as she is not consistently taking her prandial insulin -She has self discontinued NovoLog 10/2023 due to hypoglycemia -Lantus has been on hold for 4 days, with a fasting BG of 89 Mg/DL this morning.  Will discontinue Lantus -We will continue current dose of Mounjaro -Patient advised to contact our office with persistent BG's of over 150 Mg/DL  MEDICATIONS: Continue Mounjaro 5 mg weekly Stop Lantus  Stop Novolog    EDUCATION / INSTRUCTIONS: BG monitoring instructions: Patient is instructed to check her blood sugars 3 times a day, before each meal. Call [place] [facility] clinic if: BG persistently < 70  I reviewed the Rule of 15 for the treatment of hypoglycemia in detail with the patient. Literature supplied.Marland Kitchen   2) Diabetic complications:  Eye: Does not have known diabetic retinopathy.  Neuro/ Feet: Does not have known diabetic peripheral neuropathy .  Renal: Patient does not have known baseline CKD. She   is  on an ACEI/ARB at present.    F/U in 3 months   Signed electronically by: Lyndle Herrlich, MD  [facility] Endocrinology  [facility] [address] [place] [place], [place] [address] Phone: [phone] FAX: [phone]   CC: Shade Flood, MD [address] [place] [address] Phone: [phone]  Fax: [phone]  Return to Endocrinology clinic as below: Future Appointments  Date Time Provider  Department Center  11/17/2023  1:20 PM Zebulan Hinshaw, Konrad Dolores, MD [facility] None  12/30/2023  7:50 AM [facility] DEVICE REMOTES [facility]  03/30/2024  7:50 AM [facility] DEVICE REMOTES [facility]  06/29/2024  7:50 AM [facility] DEVICE REMOTES [facility]  10/27/2024  3:15 PM Ihor Austin, NP [facility] None

## 2023-11-18 ENCOUNTER — Encounter: Payer: Self-pay | Admitting: Family Medicine

## 2023-11-21 ENCOUNTER — Encounter: Payer: Self-pay | Admitting: Family Medicine

## 2023-11-21 DIAGNOSIS — H353221 Exudative age-related macular degeneration, left eye, with active choroidal neovascularization: Secondary | ICD-10-CM | POA: Diagnosis not present

## 2023-12-12 DIAGNOSIS — H34811 Central retinal vein occlusion, right eye, with macular edema: Secondary | ICD-10-CM | POA: Diagnosis not present

## 2023-12-22 DIAGNOSIS — H353221 Exudative age-related macular degeneration, left eye, with active choroidal neovascularization: Secondary | ICD-10-CM | POA: Diagnosis not present

## 2023-12-30 ENCOUNTER — Ambulatory Visit (INDEPENDENT_AMBULATORY_CARE_PROVIDER_SITE_OTHER): Payer: Medicare PPO

## 2023-12-30 DIAGNOSIS — I442 Atrioventricular block, complete: Secondary | ICD-10-CM | POA: Diagnosis not present

## 2023-12-31 LAB — CUP PACEART REMOTE DEVICE CHECK
Battery Remaining Longevity: 113 mo
Battery Voltage: 3 V
Brady Statistic AP VP Percent: 2.58 %
Brady Statistic AP VS Percent: 0.81 %
Brady Statistic AS VP Percent: 18.32 %
Brady Statistic AS VS Percent: 78.29 %
Brady Statistic RA Percent Paced: 3.39 %
Brady Statistic RV Percent Paced: 20.91 %
Date Time Interrogation Session: 20250422111205
Implantable Lead Connection Status: 753985
Implantable Lead Connection Status: 753985
Implantable Lead Implant Date: 20190425
Implantable Lead Implant Date: 20190425
Implantable Lead Location: 753859
Implantable Lead Location: 753860
Implantable Lead Model: 5076
Implantable Lead Model: 5076
Implantable Pulse Generator Implant Date: 20190425
Lead Channel Impedance Value: 399 Ohm
Lead Channel Impedance Value: 399 Ohm
Lead Channel Impedance Value: 437 Ohm
Lead Channel Impedance Value: 513 Ohm
Lead Channel Pacing Threshold Amplitude: 0.375 V
Lead Channel Pacing Threshold Amplitude: 0.75 V
Lead Channel Pacing Threshold Pulse Width: 0.4 ms
Lead Channel Pacing Threshold Pulse Width: 0.4 ms
Lead Channel Sensing Intrinsic Amplitude: 12.625 mV
Lead Channel Sensing Intrinsic Amplitude: 12.625 mV
Lead Channel Sensing Intrinsic Amplitude: 3.25 mV
Lead Channel Sensing Intrinsic Amplitude: 3.25 mV
Lead Channel Setting Pacing Amplitude: 1.5 V
Lead Channel Setting Pacing Amplitude: 2.5 V
Lead Channel Setting Pacing Pulse Width: 0.4 ms
Lead Channel Setting Sensing Sensitivity: 2 mV
Zone Setting Status: 755011
Zone Setting Status: 755011

## 2024-01-16 ENCOUNTER — Other Ambulatory Visit: Payer: Self-pay | Admitting: Family Medicine

## 2024-01-16 DIAGNOSIS — E876 Hypokalemia: Secondary | ICD-10-CM

## 2024-01-21 DIAGNOSIS — H353221 Exudative age-related macular degeneration, left eye, with active choroidal neovascularization: Secondary | ICD-10-CM | POA: Diagnosis not present

## 2024-02-12 NOTE — Addendum Note (Signed)
 Addended by: Lott Rouleau A on: 02/12/2024 11:55 AM   Modules accepted: Orders

## 2024-02-12 NOTE — Progress Notes (Signed)
 Remote pacemaker transmission.

## 2024-02-13 DIAGNOSIS — H34811 Central retinal vein occlusion, right eye, with macular edema: Secondary | ICD-10-CM | POA: Diagnosis not present

## 2024-02-20 ENCOUNTER — Encounter: Payer: Self-pay | Admitting: Internal Medicine

## 2024-02-20 ENCOUNTER — Ambulatory Visit: Admitting: Internal Medicine

## 2024-02-20 ENCOUNTER — Other Ambulatory Visit: Payer: Self-pay

## 2024-02-20 VITALS — BP 118/66 | HR 76 | Ht 63.0 in | Wt 129.0 lb

## 2024-02-20 DIAGNOSIS — E1149 Type 2 diabetes mellitus with other diabetic neurological complication: Secondary | ICD-10-CM

## 2024-02-20 DIAGNOSIS — Z794 Long term (current) use of insulin: Secondary | ICD-10-CM | POA: Diagnosis not present

## 2024-02-20 DIAGNOSIS — H353221 Exudative age-related macular degeneration, left eye, with active choroidal neovascularization: Secondary | ICD-10-CM | POA: Diagnosis not present

## 2024-02-20 LAB — POCT GLYCOSYLATED HEMOGLOBIN (HGB A1C): Hemoglobin A1C: 6.2 % — AB (ref 4.0–5.6)

## 2024-02-20 MED ORDER — TIRZEPATIDE 5 MG/0.5ML ~~LOC~~ SOAJ: 5.0000 mg | SUBCUTANEOUS | 3 refills | Status: DC

## 2024-02-20 NOTE — Progress Notes (Signed)
 Name: Brittany [name]  Age/ Sex: 63 y.o., female   MRN/ DOB: [id number], 12-01-1960     PCP: Benjiman Bras, MD   Reason for Endocrinology Evaluation: Type 2 Diabetes Mellitus  Initial Endocrine Consultative Visit: 05/04/2018    PATIENT IDENTIFIER: Brittany [name] is a 63 y.o. female with a past medical history of T2DM, HTN, fibromyalgia and dyslipidemia. The patient has followed with Endocrinology clinic since 05/04/2018 for consultative assistance with management of her diabetes.  DIABETIC HISTORY:  Ms. Erlich was diagnosed with DM 1999, and started insulin  therapy in 2019. Her hemoglobin A1c has ranged from 6.6% in 2013, peaking at >14.0% in 2018.    Was last seen by Dr. Washington Hacker in March 2023  Switched Trulicity  to Mounjaro   05/2023  Novolog  discontinued  10/2023 due to hypoglycemia  Lantus  discontinued 11/2023 with an A1c 6.1%   SUBJECTIVE:   During the last visit (11/17/2023): A1c 6.1%     Today (02/20/2024): Ms. Buikema is here for a follow up on diabetes management.  She checks her blood sugars multiple  times daily, through CGM. The patient has had hypoglycemic episodes since the last clinic visit.    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  She continues to follow-up with ophthalmology for macular degeneration of the left eye Denies constipation or diarrhea  Denies nausea or vomiting   HOME DIABETES REGIMEN:  Mounjaro  5 mg weekly    Statin: yes ACE-I/ARB: Yes    CONTINUOUS GLUCOSE MONITORING RECORD INTERPRETATION :   Sensor description:dexcom G7  Dates of Recording: 5/31-6/13/2025  Results statistics:   CGM use % of time 88  Average and SD 109/30  Time in range 92  %  % Time Above 180 2  % Time above 250 0  % Time Below target 4   Glycemic patterns summary: BGs are optimal throughout the day and night  Hyperglycemic episodes N/A  Hypoglycemic episodes occurred all night  Overnight periods: Trends down   DIABETIC  COMPLICATIONS: Microvascular complications:   Denies: CKD Last Eye Exam: Completed 12/2023 ? DR  Macrovascular complications:  CVA Denies: CAD, PVD   HISTORY:  Past Medical History:  Past Medical History:  Diagnosis Date   Asthma    Cataract    Phreesia 10/03/2020   Chronic lower back pain    Depression    Depression    Phreesia 10/03/2020   Diabetes mellitus without complication (HCC)    Phreesia 10/03/2020   Family history of adverse reaction to anesthesia    Mother has trouble coming out of anesthesia; patient reports she has no problems   Fibromyalgia    High cholesterol    Hypertension    Phreesia 10/03/2020   Presence of permanent cardiac pacemaker    Seizures (HCC) 11/2017   day after she came home from hospital after having stroke (01/01/2018)   Stroke (HCC) 12/06/2017   speech issues since (01/01/2018)   Type II diabetes mellitus [facility])    Past Surgical History:  Past Surgical History:  Procedure Laterality Date   CHOLECYSTECTOMY     EYE SURGERY N/A    Phreesia 10/03/2020   GAS/FLUID EXCHANGE Right 12/12/2019   Procedure: Gas/Fluid Exchange;  Surgeon: Jearline Minder, MD;  Location: [facility] OR;  Service: Ophthalmology;  Laterality: Right;   INSERT / REPLACE / REMOVE PACEMAKER  01/01/2018   IR CT HEAD LTD  12/06/2017   IR PERCUTANEOUS ART THROMBECTOMY/INFUSION INTRACRANIAL INC DIAG ANGIO  12/06/2017   LOOP  RECORDER INSERTION N/A 12/12/2017   Procedure: LOOP RECORDER INSERTION;  Surgeon: Lei Pump, MD;  [facility] LAB;  Service: Cardiovascular;  Laterality: N/A;   LOOP RECORDER REMOVAL  01/01/2018   LOOP RECORDER REMOVAL N/A 01/01/2018   Procedure: LOOP RECORDER REMOVAL;  Surgeon: Lei Pump, MD;  [facility] LAB;  Service: Cardiovascular;  Laterality: N/A;   NECK SURGERY     PACEMAKER IMPLANT N/A 01/01/2018   Procedure: PACEMAKER IMPLANT;  Surgeon: Lei Pump, MD;  [facility] LAB;  Service:  Cardiovascular;  Laterality: N/A;   PHOTOCOAGULATION Right 12/12/2019   Procedure: Photocoagulation;  Surgeon: Jearline Minder, MD;  Location: [facility] OR;  Service: Ophthalmology;  Laterality: Right;   RADIOLOGY WITH ANESTHESIA N/A 12/06/2017   Procedure: CODE STROKE;  Surgeon: Luellen Sages, MD;  Location[facility] OR;  Service: Radiology;  Laterality: N/A;   SCLERAL BUCKLE WITH POSSIBLE 25 GAUGE PARS PLANA VITRECTOMY Right 12/12/2019   Procedure: SCLERAL BUCKLE WITH  PARS PLANA VITRECTOMY RETINAL DETACHMENT REPAIR;  Surgeon: Jearline Minder, MD;  Location: [facility] OR;  Service: Ophthalmology;  Laterality: Right;   TEE WITHOUT CARDIOVERSION N/A 12/10/2017   Procedure: TRANSESOPHAGEAL ECHOCARDIOGRAM (TEE);  Surgeon: Hugh Madura, MD;  Location: [facility] ENDOSCOPY;  Service: Cardiovascular;  Laterality: N/A;   THROMBECTOMY FEMORAL ARTERY Right 12/07/2017   Procedure: THROMBECTOMY RIGHT FEMORAL ARTERY;  Surgeon: Adine Hoof, MD;  Location: [facility] OR;  Service: Vascular;  Laterality: Right;   TONSILLECTOMY     Social History:  reports that she has quit smoking. Her smoking use included cigarettes. She has a 38 pack-year smoking history. She has never used smokeless tobacco. She reports current alcohol use. She reports that she does not use drugs. Family History:  Family History  Problem Relation Age of Onset   Cancer Mother    Diabetes Mother    Heart disease Mother    Parkinsonism Mother    Diabetes Father    Heart disease Father    Dementia Father    COPD Maternal Aunt    Cancer Maternal Grandmother 29       lung   Breast cancer Neg Hx    Stroke Neg Hx      HOME MEDICATIONS: Allergies as of 02/20/2024   No Known Allergies      Medication List        Accurate as of February 20, 2024  1:46 PM. If you have any questions, ask your nurse or doctor.          Accu-Chek Guide Me w/Device Kit 1 each by Does not apply route 4 (four) times daily. E11.9   Accu-Chek Guide test strip Generic drug:  glucose blood USE AS DIRECTED FOUR TIMES DAILY   Alphagan P 0.1 % Soln Generic drug: brimonidine Apply to eye.   Alphagan P 0.1 % Soln Generic drug: brimonidine Apply to eye.   BD Pen Needle Nano 2nd Gen 32G X 4 MM Misc Generic drug: Insulin  Pen Needle USE FOUR TIMES DAILY AS DIRECTED   clopidogrel  75 MG tablet Commonly known as: PLAVIX  TAKE 1 TABLET(75 MG) BY MOUTH DAILY   Dexcom G7 Sensor Misc 1 Device by Does not apply route as directed.   irbesartan  150 MG tablet Commonly known as: AVAPRO  TAKE 1 TABLET(150 MG) BY MOUTH DAILY   levETIRAcetam  500 MG tablet Commonly known as: KEPPRA  Take 1 tablet (500 mg total) by mouth 2 (two) times daily.   LORazepam  0.5 MG tablet Commonly known  as: Ativan  Take 0.5-1 tablets (0.25-0.5 mg total) by mouth 2 (two) times daily as needed for anxiety (anxiety and tremors (social situations)).   Multi-B Complex Caps Take one a day ,B complex  multivitamin over the counter or use prenatal vitamin.   ofloxacin  0.3 % OTIC solution Commonly known as: FLOXIN  Place 5 drops into the right ear 2 (two) times daily.   OneTouch Delica Plus Lancet33G Misc USE FOUR TIMES DAILY   potassium chloride  SA 20 MEQ tablet Commonly known as: KLOR-CON  M TAKE 2 TABLETS BY MOUTH ON DAY 1 THEN TAKE 1 TABLET BY MOUTH EVERY DAY AFTER.   primidone  250 MG tablet Commonly known as: MYSOLINE  Take 1 tablet (250 mg total) by mouth daily.   rosuvastatin  20 MG tablet Commonly known as: CRESTOR  TAKE 1 TABLET(20 MG) BY MOUTH DAILY   sertraline  100 MG tablet Commonly known as: ZOLOFT  TAKE 2 TABLETS(200 MG) BY MOUTH AT BEDTIME   timolol 0.5 % ophthalmic solution Commonly known as: TIMOPTIC SMARTSIG:In Eye(s)   tirzepatide  5 MG/0.5ML Pen Commonly known as: MOUNJARO  Inject 5 mg into the skin once a week.         OBJECTIVE:   Vital Signs: BP 118/66 (BP Location: Left Arm, Patient Position: Sitting, Cuff Size: Normal)   Pulse 76   Ht 5' 3 (1.6 m)   Wt  129 lb (58.5 kg)   SpO2 99%   BMI 22.85 kg/m   Wt Readings from Last 3 Encounters:  02/20/24 129 lb (58.5 kg)  11/17/23 143 lb (64.9 kg)  11/12/23 145 lb 6.4 oz (66 kg)     Exam: General: Pt appears well and is in NAD  Lungs: Clear with good BS bilat   Heart: RRR   Neuro: MS is good with appropriate affect, pt is alert and Ox3    DM foot exam: 05/26/2023  The skin of the feet is intact without sores or ulcerations. The pedal pulses are 2+ on right and 2+ on left. The sensation is intact to a screening 5.07, 10 gram monofilament bilaterally        DATA REVIEWED:  Lab Results  Component Value Date   HGBA1C 6.1 11/12/2023   HGBA1C 9.0 (A) 05/26/2023   HGBA1C 6.7 (H) 11/11/2022    Latest Reference Range & Units 11/12/23 14:56  Sodium 135 - 145 mEq/L 140  Potassium 3.5 - 5.1 mEq/L 3.1 (L)  Chloride 96 - 112 mEq/L 103  CO2 19 - 32 mEq/L 30  Glucose 70 - 99 mg/dL 161 (H)  BUN 6 - 23 mg/dL 9  Creatinine 0.96 - 0.45 mg/dL 4.09  Calcium  8.4 - 10.5 mg/dL 9.3  Alkaline Phosphatase 39 - 117 U/L 85  Albumin 3.5 - 5.2 g/dL 4.1  AST 0 - 37 U/L 13  ALT 0 - 35 U/L 9  Total Protein 6.0 - 8.3 g/dL 6.9    Latest Reference Range & Units 11/12/23 14:56  Total CHOL/HDL Ratio  3  Cholesterol 0 - 200 mg/dL 811  HDL Cholesterol >91.47 mg/dL 82.95  LDL (calc) 0 - 99 mg/dL 48  MICROALB/CREAT RATIO 0.0 - 30.0 mg/g 8.9  NonHDL  69.17  Triglycerides 0.0 - 149.0 mg/dL 621.3  VLDL 0.0 - 08.6 mg/dL 57.8    Latest Reference Range & Units 11/12/23 14:56  Creatinine,U mg/dL 469.6  Microalb, Ur 0.0 - 1.9 mg/dL 1.5  MICROALB/CREAT RATIO 0.0 - 30.0 mg/g 8.9     Old records , labs and images have been reviewed.  ASSESSMENT / PLAN / RECOMMENDATIONS:   1) Type 2 Diabetes Mellitus, Optimally  controlled, With Macrovascular complications - Most recent A1c of 6.2 %. Goal A1c < 7.0 %.     -A1c has remained optimal -Barriers to diabetes self-care is history of CVA and memory issues as  she is not consistently taking her prandial insulin  -She has self discontinued NovoLog  10/2023 due to hypoglycemia -Lantus  was discontinued 11/2023 -We will continue current dose of Mounjaro  - She has been noted with hypoglycemia on CGM download, I do believe these are not true readings, I have asked her to verify these low glucose readings with a fingerstick  MEDICATIONS: Continue Mounjaro  5 mg weekly  EDUCATION / INSTRUCTIONS: BG monitoring instructions: Patient is instructed to check her blood sugars 3 times a day, before each meal. Call [place] [facility] if: BG persistently < 70  I reviewed the Rule of 15 for the treatment of hypoglycemia in detail with the patient. Literature supplied.Aaron Aas   2) Diabetic complications:  Eye: Does not have known diabetic retinopathy.  Neuro/ Feet: Does not have known diabetic peripheral neuropathy .  Renal: Patient does not have known baseline CKD. She   is  on an ACEI/ARB at present.    F/U in 6 months   Signed electronically by: Natale Bail, MD  [facility] Endocrinology  [facility] [address] [place] [place], [place] [address] Phone: [phone] FAX: [phone]   CC: Benjiman Bras, MD [address]  [place] [address] Phone: [phone]  Fax: [phone]  Return to Endocrinology clinic as below: Future Appointments  Date Time Provider Department Center  03/30/2024  7:50 AM [facility] DEVICE REMOTES [facility]  06/29/2024  7:50 AM [facility] DEVICE REMOTES [facility] H&V  10/27/2024  3:15 PM Johny Nap, NP [facility] None

## 2024-02-20 NOTE — Patient Instructions (Signed)
 Continue Mounjaro  5 mg once weekly     HOW TO TREAT LOW BLOOD SUGARS (Blood sugar LESS THAN 70 MG/DL) Please follow the RULE OF 15 for the treatment of hypoglycemia treatment (when your (blood sugars are less than 70 mg/dL)   STEP 1: Take 15 grams of carbohydrates when your blood sugar is low, which includes:  3-4 GLUCOSE TABS  OR 3-4 OZ OF JUICE OR REGULAR SODA OR ONE TUBE OF GLUCOSE GEL    STEP 2: RECHECK blood sugar in 15 MINUTES STEP 3: If your blood sugar is still low at the 15 minute recheck --> then, go back to STEP 1 and treat AGAIN with another 15 grams of carbohydrates.

## 2024-03-19 ENCOUNTER — Other Ambulatory Visit: Payer: Self-pay | Admitting: Family Medicine

## 2024-03-19 DIAGNOSIS — E876 Hypokalemia: Secondary | ICD-10-CM

## 2024-03-30 ENCOUNTER — Ambulatory Visit: Payer: Medicare PPO

## 2024-03-30 DIAGNOSIS — I442 Atrioventricular block, complete: Secondary | ICD-10-CM

## 2024-03-31 LAB — CUP PACEART REMOTE DEVICE CHECK
Battery Remaining Longevity: 111 mo
Battery Voltage: 3 V
Brady Statistic AP VP Percent: 0.73 %
Brady Statistic AP VS Percent: 0.39 %
Brady Statistic AS VP Percent: 16.9 %
Brady Statistic AS VS Percent: 81.99 %
Brady Statistic RA Percent Paced: 1.11 %
Brady Statistic RV Percent Paced: 17.63 %
Date Time Interrogation Session: 20250722010341
Implantable Lead Connection Status: 753985
Implantable Lead Connection Status: 753985
Implantable Lead Implant Date: 20190425
Implantable Lead Implant Date: 20190425
Implantable Lead Location: 753859
Implantable Lead Location: 753860
Implantable Lead Model: 5076
Implantable Lead Model: 5076
Implantable Pulse Generator Implant Date: 20190425
Lead Channel Impedance Value: 361 Ohm
Lead Channel Impedance Value: 399 Ohm
Lead Channel Impedance Value: 418 Ohm
Lead Channel Impedance Value: 494 Ohm
Lead Channel Pacing Threshold Amplitude: 0.5 V
Lead Channel Pacing Threshold Amplitude: 0.75 V
Lead Channel Pacing Threshold Pulse Width: 0.4 ms
Lead Channel Pacing Threshold Pulse Width: 0.4 ms
Lead Channel Sensing Intrinsic Amplitude: 10.125 mV
Lead Channel Sensing Intrinsic Amplitude: 10.125 mV
Lead Channel Sensing Intrinsic Amplitude: 2.375 mV
Lead Channel Sensing Intrinsic Amplitude: 2.375 mV
Lead Channel Setting Pacing Amplitude: 1.5 V
Lead Channel Setting Pacing Amplitude: 2.5 V
Lead Channel Setting Pacing Pulse Width: 0.4 ms
Lead Channel Setting Sensing Sensitivity: 2 mV
Zone Setting Status: 755011
Zone Setting Status: 755011

## 2024-04-01 ENCOUNTER — Other Ambulatory Visit (HOSPITAL_COMMUNITY): Payer: Self-pay

## 2024-04-05 ENCOUNTER — Ambulatory Visit: Payer: Self-pay | Admitting: Cardiology

## 2024-04-05 DIAGNOSIS — H353221 Exudative age-related macular degeneration, left eye, with active choroidal neovascularization: Secondary | ICD-10-CM | POA: Diagnosis not present

## 2024-04-06 ENCOUNTER — Other Ambulatory Visit (HOSPITAL_COMMUNITY): Payer: Self-pay

## 2024-04-07 ENCOUNTER — Other Ambulatory Visit (HOSPITAL_COMMUNITY): Payer: Self-pay

## 2024-04-07 ENCOUNTER — Other Ambulatory Visit: Payer: Self-pay | Admitting: Family Medicine

## 2024-04-07 ENCOUNTER — Telehealth: Payer: Self-pay

## 2024-04-07 MED ORDER — DEXCOM G7 SENSOR MISC
1.0000 | 3 refills | Status: AC
  Filled 2024-04-07: qty 9, 90d supply, fill #0

## 2024-04-07 NOTE — Telephone Encounter (Signed)
 Type of form received: Order for Dexcom  Additional comments: Chart notes already printed   Received by: Fax  Form should be Faxed/mailed to: [phone]  Is patient requesting call for pickup: No- Fax back   Form placed:  In providers sign folder   Attach charge sheet.  Provider will determine charge.  Individual made aware of 3-5 business day turn around No?

## 2024-04-07 NOTE — Telephone Encounter (Signed)
 Form completed, of note she is not on insulin  per June visit with endocrinology, notated on the form but she is on Mounjaro  injections.  Prior hypoglycemia on NovoLog .  Paperwork completed and placed in fax bin at back nurse station

## 2024-04-07 NOTE — Telephone Encounter (Signed)
 Requested Prescriptions   Pending Prescriptions Disp Refills   Continuous Glucose Sensor (DEXCOM G7 SENSOR) MISC 9 each 3    Sig: 1 Device by Does not apply route as directed.     Date of patient request: 04/07/24 Last office visit: 11/12/2023 Upcoming visit: Visit date not found Date of last refill: 04/02/22 Last refill amount: 9 each with 3 refills  Sensor was filled by another provider in 2023. Please advise

## 2024-04-08 NOTE — Telephone Encounter (Signed)
 Faxed paperwork and scanned to documents for patients chart. Placed in provider folder at front desk.

## 2024-04-13 ENCOUNTER — Telehealth: Payer: Self-pay

## 2024-04-13 NOTE — Telephone Encounter (Signed)
 Type of form received: [employer] request for medication and diagnosis code confirmation  Additional comments:    Received by: fax  Form should be Faxed/mailed to: [phone]  Is patient requesting call for pickup: no  Form placed:  in providers sign folder at nurse station   Attach charge sheet.  Provider will determine charge.  Individual made aware of 3-5 business day turn around No?

## 2024-04-14 NOTE — Telephone Encounter (Signed)
 Paperwork completed based on last visit with her endocrinologist, and placed in fax bin at back nurse station

## 2024-04-15 DIAGNOSIS — E1165 Type 2 diabetes mellitus with hyperglycemia: Secondary | ICD-10-CM | POA: Diagnosis not present

## 2024-04-15 NOTE — Telephone Encounter (Signed)
 Faxed back to requested number from [employer] [phone]

## 2024-04-16 ENCOUNTER — Telehealth: Payer: Self-pay

## 2024-04-16 ENCOUNTER — Other Ambulatory Visit (HOSPITAL_COMMUNITY): Payer: Self-pay

## 2024-04-16 NOTE — Telephone Encounter (Signed)
 Faxed paperwork to [employer] again. FYI

## 2024-04-16 NOTE — Telephone Encounter (Unsigned)
 Copied from CRM #[id number]. Topic: General - Other >> Apr 15, 2024  5:24 PM Deleta RAMAN wrote: Reason for CRM: dakina Is calling  from [employer] to see if the fax was received for Dexcom g6. Please contact her at [phone]

## 2024-04-19 ENCOUNTER — Other Ambulatory Visit (HOSPITAL_COMMUNITY): Payer: Self-pay

## 2024-04-19 DIAGNOSIS — H34811 Central retinal vein occlusion, right eye, with macular edema: Secondary | ICD-10-CM | POA: Diagnosis not present

## 2024-04-19 DIAGNOSIS — H353221 Exudative age-related macular degeneration, left eye, with active choroidal neovascularization: Secondary | ICD-10-CM | POA: Diagnosis not present

## 2024-04-27 NOTE — Telephone Encounter (Signed)
 error

## 2024-05-03 DIAGNOSIS — H353221 Exudative age-related macular degeneration, left eye, with active choroidal neovascularization: Secondary | ICD-10-CM | POA: Diagnosis not present

## 2024-05-27 ENCOUNTER — Telehealth: Payer: Self-pay

## 2024-05-27 DIAGNOSIS — Z1231 Encounter for screening mammogram for malignant neoplasm of breast: Secondary | ICD-10-CM

## 2024-05-27 NOTE — Addendum Note (Signed)
 Addended by: Michaeleen Down R on: 05/27/2024 01:07 PM   Modules accepted: Orders

## 2024-05-27 NOTE — Telephone Encounter (Signed)
 Patient is requesting a mamm. Last one was 2 years ago. She was due 04/24/24. Please advise

## 2024-05-27 NOTE — Telephone Encounter (Signed)
 I will place a referral but typically a referral should not be needed.  Typically patients can call previous imaging location and schedule.  Looks like she had a mobile mammography previously.  I will enter that location but not sure if it will need to be changed to a different facility.  Thanks

## 2024-05-27 NOTE — Telephone Encounter (Signed)
 Copied from CRM [id number]. Topic: Appointments - Scheduling Inquiry for Clinic >> May 27, 2024 11:19 AM [name] wrote: Reason for CRM: Patient called in would like to be scheduled for a mammogram would like a callback once she is ready to be scheduled

## 2024-05-27 NOTE — Telephone Encounter (Signed)
 Patient is aware and wait for scheduler to call

## 2024-05-31 DIAGNOSIS — H353221 Exudative age-related macular degeneration, left eye, with active choroidal neovascularization: Secondary | ICD-10-CM | POA: Diagnosis not present

## 2024-06-08 ENCOUNTER — Other Ambulatory Visit: Payer: Self-pay | Admitting: Family Medicine

## 2024-06-08 DIAGNOSIS — E876 Hypokalemia: Secondary | ICD-10-CM

## 2024-06-10 ENCOUNTER — Ambulatory Visit
Admission: RE | Admit: 2024-06-10 | Discharge: 2024-06-10 | Disposition: A | Source: Ambulatory Visit | Attending: Family Medicine | Admitting: Family Medicine

## 2024-06-10 DIAGNOSIS — Z1231 Encounter for screening mammogram for malignant neoplasm of breast: Secondary | ICD-10-CM

## 2024-06-11 NOTE — Progress Notes (Signed)
 Remote PPM Transmission

## 2024-06-21 ENCOUNTER — Telehealth: Payer: Self-pay | Admitting: Family Medicine

## 2024-06-21 DIAGNOSIS — H34811 Central retinal vein occlusion, right eye, with macular edema: Secondary | ICD-10-CM | POA: Diagnosis not present

## 2024-06-21 NOTE — Telephone Encounter (Unsigned)
 Copied from CRM [id number]. Topic: Clinical - Medication Refill >> Jun 21, 2024  9:37 AM Alfonso HERO wrote: Medication: sertraline  (ZOLOFT ) 100 MG tablet  Has the patient contacted their pharmacy? Yes (Agent: If no, request that the patient contact the pharmacy for the refill. If patient does not wish to contact the pharmacy document the reason why and proceed with request.) (Agent: If yes, when and what did the pharmacy advise?)  This is the patient's preferred pharmacy:   [name] - [place], [place] - [address] [place] [address] Phone: [phone] Fax: [phone]   Is this the correct pharmacy for this prescription? Yes If no, delete pharmacy and type the correct one.   Has the prescription been filled recently? Yes  Is the patient out of the medication? Yes  Has the patient been seen for an appointment in the last year OR does the patient have an upcoming appointment? Yes  Can we respond through MyChart? Yes  Agent: Please be advised that Rx refills may take up to 3 business days. We ask that you follow-up with your pharmacy.

## 2024-06-24 ENCOUNTER — Other Ambulatory Visit: Payer: Self-pay

## 2024-06-24 DIAGNOSIS — F331 Major depressive disorder, recurrent, moderate: Secondary | ICD-10-CM

## 2024-06-24 MED ORDER — SERTRALINE HCL 100 MG PO TABS: ORAL_TABLET | ORAL | 0 refills | Status: DC

## 2024-06-24 NOTE — Telephone Encounter (Signed)
 Copied from CRM [id number]. Topic: Clinical - Prescription Issue >> Jun 24, 2024 10:37 AM Nessti S wrote: Reason for CRM: patient called because she hasn't received med refill for sertraline  (ZOLOFT ) 100 MG tablet. call back number [phone]

## 2024-06-24 NOTE — Telephone Encounter (Signed)
 [facility] reports [facility] asked for new prescription  Sent in 90 day

## 2024-06-29 ENCOUNTER — Ambulatory Visit: Payer: Medicare PPO

## 2024-06-29 DIAGNOSIS — I442 Atrioventricular block, complete: Secondary | ICD-10-CM

## 2024-06-30 DIAGNOSIS — H353221 Exudative age-related macular degeneration, left eye, with active choroidal neovascularization: Secondary | ICD-10-CM | POA: Diagnosis not present

## 2024-07-01 LAB — CUP PACEART REMOTE DEVICE CHECK
Battery Remaining Longevity: 108 mo
Battery Voltage: 3 V
Brady Statistic AP VP Percent: 0.91 %
Brady Statistic AP VS Percent: 0.4 %
Brady Statistic AS VP Percent: 15.88 %
Brady Statistic AS VS Percent: 82.8 %
Brady Statistic RA Percent Paced: 1.32 %
Brady Statistic RV Percent Paced: 16.8 %
Date Time Interrogation Session: 20251023070337
Implantable Lead Connection Status: 753985
Implantable Lead Connection Status: 753985
Implantable Lead Implant Date: 20190425
Implantable Lead Implant Date: 20190425
Implantable Lead Location: 753859
Implantable Lead Location: 753860
Implantable Lead Model: 5076
Implantable Lead Model: 5076
Implantable Pulse Generator Implant Date: 20190425
Lead Channel Impedance Value: 380 Ohm
Lead Channel Impedance Value: 380 Ohm
Lead Channel Impedance Value: 437 Ohm
Lead Channel Impedance Value: 494 Ohm
Lead Channel Pacing Threshold Amplitude: 0.5 V
Lead Channel Pacing Threshold Amplitude: 1 V
Lead Channel Pacing Threshold Pulse Width: 0.4 ms
Lead Channel Pacing Threshold Pulse Width: 0.4 ms
Lead Channel Sensing Intrinsic Amplitude: 10.75 mV
Lead Channel Sensing Intrinsic Amplitude: 10.75 mV
Lead Channel Sensing Intrinsic Amplitude: 2.25 mV
Lead Channel Sensing Intrinsic Amplitude: 2.25 mV
Lead Channel Setting Pacing Amplitude: 1.5 V
Lead Channel Setting Pacing Amplitude: 2.5 V
Lead Channel Setting Pacing Pulse Width: 0.4 ms
Lead Channel Setting Sensing Sensitivity: 2 mV
Zone Setting Status: 755011
Zone Setting Status: 755011

## 2024-07-02 NOTE — Progress Notes (Signed)
 Remote PPM Transmission

## 2024-07-07 ENCOUNTER — Ambulatory Visit: Payer: Self-pay | Admitting: Cardiology

## 2024-07-14 ENCOUNTER — Ambulatory Visit (INDEPENDENT_AMBULATORY_CARE_PROVIDER_SITE_OTHER): Admitting: *Deleted

## 2024-07-14 VITALS — Ht 63.0 in | Wt 129.0 lb

## 2024-07-14 DIAGNOSIS — Z0000 Encounter for general adult medical examination without abnormal findings: Secondary | ICD-10-CM | POA: Diagnosis not present

## 2024-07-14 NOTE — Patient Instructions (Addendum)
 Brittany [name],  Thank you for taking the time for your Medicare Wellness Visit. I appreciate your continued commitment to your health goals. Please review the care plan we discussed, and feel free to reach out if I can assist you further.  Please note that Annual Wellness Visits do not include a physical exam. Some assessments may be limited, especially if the visit was conducted virtually. If needed, we may recommend an in-person follow-up with your provider.  Ongoing Care Seeing your primary care provider every 3 to 6 months helps us  monitor your health and provide consistent, personalized care.   Referrals If a referral was made during today's visit and you haven't received any updates within two weeks, please contact the referred provider directly to check on the status.  Recommended Screenings:  Health Maintenance  Topic Date Due   Screening for Lung Cancer  01/01/2017   Colon Cancer Screening  02/07/2022   DTaP/Tdap/Td vaccine (2 - Td or Tdap) 05/01/2023   Eye exam for diabetics  03/31/2024   Flu Shot  04/09/2024   DEXA scan (bone density measurement)  Never done   Complete foot exam   05/25/2024   COVID-19 Vaccine (3 - Moderna risk series) 07/30/2024*   Hemoglobin A1C  08/21/2024   Yearly kidney health urinalysis for diabetes  11/11/2024   Yearly kidney function blood test for diabetes  11/16/2024   Medicare Annual Wellness Visit  07/14/2025   Breast Cancer Screening  06/10/2026   Pap with HPV screening  06/07/2027   Pneumococcal Vaccine for age over 49  Completed   Hepatitis C Screening  Completed   HIV Screening  Completed   Zoster (Shingles) Vaccine  Completed   Hepatitis B Vaccine  Aged Out   Meningitis B Vaccine  Aged Out  *Topic was postponed. The date shown is not the original due date.       07/14/2024   10:53 AM  Advanced Directives  Does Patient Have a Medical Advance Directive? No  Would patient like information on creating a medical advance directive? No -  Patient declined    Vision: Annual vision screenings are recommended for early detection of glaucoma, cataracts, and diabetic retinopathy. These exams can also reveal signs of chronic conditions such as diabetes and high blood pressure.  Dental: Annual dental screenings help detect early signs of oral cancer, gum disease, and other conditions linked to overall health, including heart disease and diabetes.  Please see the attached documents for additional preventive care recommendations.       Brittany [name] , Thank you for taking time to come for your Medicare Wellness Visit. I appreciate your ongoing commitment to your health goals. Please review the following plan we discussed and let me know if I can assist you in the future.   Screening recommendations/referrals: Colonoscopy:  Mammogram:  Bone Density:  Recommended yearly ophthalmology/optometry visit for glaucoma screening and checkup Recommended yearly dental visit for hygiene and checkup  Vaccinations: Influenza vaccine:  Pneumococcal vaccine:  Tdap vaccine:  Shingles vaccine:      Preventive Care 65 Years and Older, Female Preventive care refers to lifestyle choices and visits with your health care provider that can promote health and wellness. What does preventive care include? A yearly physical exam. This is also called an annual well check. Dental exams once or twice a year. Routine eye exams. Ask your health care provider how often you should have your eyes checked. Personal lifestyle choices, including: Daily care of your teeth and gums.  Regular physical activity. Eating a healthy diet. Avoiding tobacco and drug use. Limiting alcohol use. Practicing safe sex. Taking low-dose aspirin  every day. Taking vitamin and mineral supplements as recommended by your health care provider. What happens during an annual well check? The services and screenings done by your health care provider during your annual well check will  depend on your age, overall health, lifestyle risk factors, and family history of disease. Counseling  Your health care provider may ask you questions about your: Alcohol use. Tobacco use. Drug use. Emotional well-being. Home and relationship well-being. Sexual activity. Eating habits. History of falls. Memory and ability to understand (cognition). Work and work astronomer. Reproductive health. Screening  You may have the following tests or measurements: Height, weight, and BMI. Blood pressure. Lipid and cholesterol levels. These may be checked every 5 years, or more frequently if you are over 60 years old. Skin check. Lung cancer screening. You may have this screening every year starting at age 75 if you have a 30-pack-year history of smoking and currently smoke or have quit within the past 15 years. Fecal occult blood test (FOBT) of the stool. You may have this test every year starting at age 32. Flexible sigmoidoscopy or colonoscopy. You may have a sigmoidoscopy every 5 years or a colonoscopy every 10 years starting at age 79. Hepatitis C blood test. Hepatitis B blood test. Sexually transmitted disease (STD) testing. Diabetes screening. This is done by checking your blood sugar (glucose) after you have not eaten for a while (fasting). You may have this done every 1-3 years. Bone density scan. This is done to screen for osteoporosis. You may have this done starting at age 82. Mammogram. This may be done every 1-2 years. Talk to your health care provider about how often you should have regular mammograms. Talk with your health care provider about your test results, treatment options, and if necessary, the need for more tests. Vaccines  Your health care provider may recommend certain vaccines, such as: Influenza vaccine. This is recommended every year. Tetanus, diphtheria, and acellular pertussis (Tdap, Td) vaccine. You may need a Td booster every 10 years. Zoster vaccine. You may  need this after age 35. Pneumococcal 13-valent conjugate (PCV13) vaccine. One dose is recommended after age 32. Pneumococcal polysaccharide (PPSV23) vaccine. One dose is recommended after age 46. Talk to your health care provider about which screenings and vaccines you need and how often you need them. This information is not intended to replace advice given to you by your health care provider. Make sure you discuss any questions you have with your health care provider. Document Released: 09/22/2015 Document Revised: 05/15/2016 Document Reviewed: 06/27/2015 Elsevier Interactive Patient Education  2017 [employer].  Fall Prevention in the Home Falls can cause injuries. They can happen to people of all ages. There are many things you can do to make your home safe and to help prevent falls. What can I do on the outside of my home? Regularly fix the edges of walkways and driveways and fix any cracks. Remove anything that might make you trip as you walk through a door, such as a raised step or threshold. Trim any bushes or trees on the path to your home. Use bright outdoor lighting. Clear any walking paths of anything that might make someone trip, such as rocks or tools. Regularly check to see if handrails are loose or broken. Make sure that both sides of any steps have handrails. Any raised decks and porches should have  guardrails on the edges. Have any leaves, snow, or ice cleared regularly. Use sand or salt on walking paths during winter. Clean up any spills in your garage right away. This includes oil or grease spills. What can I do in the bathroom? Use night lights. Install grab bars by the toilet and in the tub and shower. Do not use towel bars as grab bars. Use non-skid mats or decals in the tub or shower. If you need to sit down in the shower, use a plastic, non-slip stool. Keep the floor dry. Clean up any water  that spills on the floor as soon as it happens. Remove soap buildup in  the tub or shower regularly. Attach bath mats securely with double-sided non-slip rug tape. Do not have throw rugs and other things on the floor that can make you trip. What can I do in the bedroom? Use night lights. Make sure that you have a light by your bed that is easy to reach. Do not use any sheets or blankets that are too big for your bed. They should not hang down onto the floor. Have a firm chair that has side arms. You can use this for support while you get dressed. Do not have throw rugs and other things on the floor that can make you trip. What can I do in the kitchen? Clean up any spills right away. Avoid walking on wet floors. Keep items that you use a lot in easy-to-reach places. If you need to reach something above you, use a strong step stool that has a grab bar. Keep electrical cords out of the way. Do not use floor polish or wax that makes floors slippery. If you must use wax, use non-skid floor wax. Do not have throw rugs and other things on the floor that can make you trip. What can I do with my stairs? Do not leave any items on the stairs. Make sure that there are handrails on both sides of the stairs and use them. Fix handrails that are broken or loose. Make sure that handrails are as long as the stairways. Check any carpeting to make sure that it is firmly attached to the stairs. Fix any carpet that is loose or worn. Avoid having throw rugs at the top or bottom of the stairs. If you do have throw rugs, attach them to the floor with carpet tape. Make sure that you have a light switch at the top of the stairs and the bottom of the stairs. If you do not have them, ask someone to add them for you. What else can I do to help prevent falls? Wear shoes that: Do not have high heels. Have rubber bottoms. Are comfortable and fit you well. Are closed at the toe. Do not wear sandals. If you use a stepladder: Make sure that it is fully opened. Do not climb a closed  stepladder. Make sure that both sides of the stepladder are locked into place. Ask someone to hold it for you, if possible. Clearly mark and make sure that you can see: Any grab bars or handrails. First and last steps. Where the edge of each step is. Use tools that help you move around (mobility aids) if they are needed. These include: Canes. Walkers. Scooters. Crutches. Turn on the lights when you go into a dark area. Replace any light bulbs as soon as they burn out. Set up your furniture so you have a clear path. Avoid moving your furniture around. If any of your  floors are uneven, fix them. If there are any pets around you, be aware of where they are. Review your medicines with your doctor. Some medicines can make you feel dizzy. This can increase your chance of falling. Ask your doctor what other things that you can do to help prevent falls. This information is not intended to replace advice given to you by your health care provider. Make sure you discuss any questions you have with your health care provider. Document Released: 06/22/2009 Document Revised: 02/01/2016 Document Reviewed: 09/30/2014 Elsevier Interactive Patient Education  2017 [employer].

## 2024-07-14 NOTE — Progress Notes (Signed)
 Subjective:   Brittany [name] is a 63 y.o. female who presents for a Medicare Annual Wellness Visit.  Visit Complete: Virtual I connected with this patient by a audio enabled telemedicine application and verified that I am speaking with the correct person using two identifiers.  Patient Location: Home Provider Location: or Home Office  I discussed the limitations of evaluation and management by telemedicine. The patient expressed understanding and agreed to proceed.  Persons Participating in Visit: Patient   Allergies (verified) Patient has no known allergies.   History: Past Medical History:  Diagnosis Date   Asthma    Cataract    Phreesia 10/03/2020   Chronic lower back pain    Depression    Depression    Phreesia 10/03/2020   Diabetes mellitus without complication (HCC)    Phreesia 10/03/2020   Family history of adverse reaction to anesthesia    Mother has trouble coming out of anesthesia; patient reports she has no problems   Fibromyalgia    High cholesterol    Hypertension    Phreesia 10/03/2020   Presence of permanent cardiac pacemaker    Seizures (HCC) 11/2017   day after she came home from hospital after having stroke (01/01/2018)   Stroke (HCC) 12/06/2017   speech issues since (01/01/2018)   Type II diabetes mellitus [facility])    Past Surgical History:  Procedure Laterality Date   CHOLECYSTECTOMY     EYE SURGERY N/A    Phreesia 10/03/2020   GAS/FLUID EXCHANGE Right 12/12/2019   Procedure: Gas/Fluid Exchange;  Surgeon: Tobie Baptist, MD;  Location: [facility] OR;  Service: Ophthalmology;  Laterality: Right;   INSERT / REPLACE / REMOVE PACEMAKER  01/01/2018   IR CT HEAD LTD  12/06/2017   IR PERCUTANEOUS ART THROMBECTOMY/INFUSION INTRACRANIAL INC DIAG ANGIO  12/06/2017   LOOP RECORDER INSERTION N/A 12/12/2017   Procedure: LOOP RECORDER INSERTION;  Surgeon: Inocencio Soyla Lunger, MD;  Location: [facility] INVASIVE CV LAB;  Service: Cardiovascular;  Laterality: N/A;   LOOP  RECORDER REMOVAL  01/01/2018   LOOP RECORDER REMOVAL N/A 01/01/2018   Procedure: LOOP RECORDER REMOVAL;  Surgeon: Inocencio Soyla Lunger, MD;  Location: [facility];  Service: Cardiovascular;  Laterality: N/A;   NECK SURGERY     PACEMAKER IMPLANT N/A 01/01/2018   Procedure: PACEMAKER IMPLANT;  Surgeon: Inocencio Soyla Lunger, MD;  Location: [facility];  Service: Cardiovascular;  Laterality: N/A;   PHOTOCOAGULATION Right 12/12/2019   Procedure: Photocoagulation;  Surgeon: Tobie Baptist, MD;  Location: [facility] OR;  Service: Ophthalmology;  Laterality: Right;   RADIOLOGY WITH ANESTHESIA N/A 12/06/2017   Procedure: CODE STROKE;  Surgeon: Dolphus Carrion, MD;  Location: [facility] OR;  Service: Radiology;  Laterality: N/A;   SCLERAL BUCKLE WITH POSSIBLE 25 GAUGE PARS PLANA VITRECTOMY Right 12/12/2019   Procedure: SCLERAL BUCKLE WITH  PARS PLANA VITRECTOMY RETINAL DETACHMENT REPAIR;  Surgeon: Tobie Baptist, MD;  Location: [facility] OR;  Service: Ophthalmology;  Laterality: Right;   TEE WITHOUT CARDIOVERSION N/A 12/10/2017   Procedure: TRANSESOPHAGEAL ECHOCARDIOGRAM (TEE);  Surgeon: Jeffrie Oneil BROCKS, MD;  Location: [facility] ENDOSCOPY;  Service: Cardiovascular;  Laterality: N/A;   THROMBECTOMY FEMORAL ARTERY Right 12/07/2017   Procedure: THROMBECTOMY RIGHT FEMORAL ARTERY;  Surgeon: Sheree Penne Bruckner, MD;  Location: [facility] OR;  Service: Vascular;  Laterality: Right;   TONSILLECTOMY     Family History  Problem Relation Age of Onset   Cancer Mother    Diabetes Mother    Heart disease Mother    Parkinsonism  Mother    Diabetes Father    Heart disease Father    Dementia Father    COPD Maternal Aunt    Cancer Maternal Grandmother 58       lung   Breast cancer Neg Hx    Stroke Neg Hx    Social History   Occupational History   Not on file  Tobacco Use   Smoking status: Former    Current packs/day: 1.00    Average packs/day: 1 pack/day for 38.0 years (38.0 ttl pk-yrs)    Types: Cigarettes   Smokeless  tobacco: Never   Tobacco comments:    Pt used chantix  July till January, quit first of the year   Vaping Use   Vaping status: Never Used  Substance and Sexual Activity   Alcohol use: Yes    Comment: 01/01/2018 nothing since stroke 11/2017   Drug use: No   Sexual activity: Not Currently   Tobacco Counseling Counseling given: Not Answered Tobacco comments: Pt used chantix  July till January, quit first of the year   SDOH Screenings   Food Insecurity: No Food Insecurity (07/14/2024)  Housing: Unknown (07/14/2024)  Transportation Needs: No Transportation Needs (07/14/2024)  Utilities: Not At Risk (07/14/2024)  Depression (PHQ2-9): Low Risk  (07/14/2024)  Financial Resource Strain: Low Risk  (07/07/2024)  Physical Activity: Unknown (07/14/2024)  Social Connections: Unknown (07/14/2024)  Stress: No Stress Concern Present (07/14/2024)  Tobacco Use: Medium Risk (07/14/2024)  Health Literacy: Adequate Health Literacy (07/14/2024)   Depression Screen    07/14/2024   11:00 AM 05/14/2023    1:55 PM 11/11/2022    1:49 PM 06/06/2022   11:12 AM 04/08/2022    1:28 PM 10/08/2021    1:25 PM 10/06/2020    4:06 PM  PHQ 2/9 Scores  PHQ - 2 Score 0 0 0 1 0 0 0  PHQ- 9 Score 0  1 3 4 4       Goals Addressed             This Visit's Progress    Patient Stated       Continue current lifestyle       Visit info / Clinical Intake: Medicare Wellness Visit Type:: Initial Annual Wellness Visit Medicare Wellness Visit Mode:: Telephone If telephone:: video declined Interpreter Needed?: No Pre-visit prep was completed: no AWV questionnaire completed by patient prior to visit?: no Living arrangements:: lives with spouse/significant other Patient's Overall Health Status Rating: good Typical amount of pain: none Does pain affect daily life?: no Are you currently prescribed opioids?: no  Dietary Habits and Nutritional Risks How many meals a day?: 2 Eats fruit and vegetables daily?: yes Most meals are  obtained by: having others provide food Diabetic:: (!) yes Any non-healing wounds?: no How often do you check your BS?: continuous glucose monitor Would you like to be referred to a Nutritionist or for Diabetic Management? : no  Functional Status Activities of Daily Living (to include ambulation/medication): Independent Ambulation: Independent Medication Administration: Independent Home Management: Independent Manage your own finances?: (!) no Primary transportation is: driving Concerns about vision?: no *vision screening is required for WTM* Concerns about hearing?: no  Fall Screening Falls in the past year?: 1 Number of falls in past year: 0 Was there an injury with Fall?: 0 Fall Risk Category Calculator: 1 Patient Fall Risk Level: Low Fall Risk  Fall Risk Patient at Risk for Falls Due to: No Fall Risks Fall risk Follow up: Falls evaluation completed; Education provided; Falls prevention discussed  Home and Transportation Safety: All rugs have non-skid backing?: (!) no All stairs or steps have railings?: yes Grab bars in the bathtub or shower?: (!) no Have non-skid surface in bathtub or shower?: (!) no Good home lighting?: yes Regular seat belt use?: yes Hospital stays in the last year:: no  Cognitive Assessment Difficulty concentrating, remembering, or making decisions? : no Will 6CIT or Mini Cog be Completed: yes What year is it?: 0 points What month is it?: 0 points Give patient an address phrase to remember (5 components): Its very sunny outside today in [place] About what time is it?: 0 points Count backwards from 20 to 1: 0 points Say the months of the year in reverse: 0 points Repeat the address phrase from earlier: 4 points 6 CIT Score: 4 points  Advance Directives (For Healthcare) Does Patient Have a Medical Advance Directive?: No Would patient like information on creating a medical advance directive?: No - Patient declined  Reviewed/Updated   Reviewed/Updated: All        Objective:    Today's Vitals   07/14/24 1107  Weight: 129 lb (58.5 kg)  Height: 5' 3 (1.6 m)   Body mass index is 22.85 kg/m.  Current Medications (verified) Outpatient Encounter Medications as of 07/14/2024  Medication Sig   ACCU-CHEK GUIDE test strip USE AS DIRECTED FOUR TIMES DAILY   ALPHAGAN P 0.1 % SOLN Apply to eye.   ALPHAGAN P 0.1 % SOLN Apply to eye.   B Complex-Biotin-FA (MULTI-B COMPLEX) CAPS Take one a day ,B complex  multivitamin over the counter or use prenatal vitamin.   Blood Glucose Monitoring Suppl (ACCU-CHEK GUIDE ME) w/Device KIT 1 each by Does not apply route 4 (four) times daily. E11.9   clopidogrel  (PLAVIX ) 75 MG tablet TAKE 1 TABLET(75 MG) BY MOUTH DAILY   Continuous Glucose Sensor (DEXCOM G7 SENSOR) MISC Change sensor every 10 days.   irbesartan  (AVAPRO ) 150 MG tablet TAKE 1 TABLET(150 MG) BY MOUTH DAILY   Lancets (ONETOUCH DELICA PLUS LANCET33G) MISC USE FOUR TIMES DAILY   levETIRAcetam  (KEPPRA ) 500 MG tablet Take 1 tablet (500 mg total) by mouth 2 (two) times daily.   LORazepam  (ATIVAN ) 0.5 MG tablet Take 0.5-1 tablets (0.25-0.5 mg total) by mouth 2 (two) times daily as needed for anxiety (anxiety and tremors (social situations)).   potassium chloride  SA (KLOR-CON  M) 20 MEQ tablet TAKE 2 TABLETS BY MOUTH ON DAY 1 THEN TAKE 1 TABLET BY MOUTH EVERY DAY AFTER.   primidone  (MYSOLINE ) 250 MG tablet Take 1 tablet (250 mg total) by mouth daily.   rosuvastatin  (CRESTOR ) 20 MG tablet TAKE 1 TABLET(20 MG) BY MOUTH DAILY   sertraline  (ZOLOFT ) 100 MG tablet TAKE 2 TABLETS(200 MG) BY MOUTH AT BEDTIME   timolol (TIMOPTIC) 0.5 % ophthalmic solution SMARTSIG:In Eye(s)   tirzepatide  (MOUNJARO ) 5 MG/0.5ML Pen Inject 5 mg into the skin once a week.   Insulin  Pen Needle (BD PEN NEEDLE NANO 2ND GEN) 32G X 4 MM MISC USE FOUR TIMES DAILY AS DIRECTED   ofloxacin  (FLOXIN ) 0.3 % OTIC solution Place 5 drops into the right ear 2 (two) times daily.    No facility-administered encounter medications on file as of 07/14/2024.   Hearing/Vision screen Hearing Screening - Comments:: No trouble hearing Vision Screening - Comments:: Up to date  [facility]ie Immunizations and Health Maintenance Health Maintenance  Topic Date Due   Lung Cancer Screening  01/01/2017   Colonoscopy  02/07/2022   DTaP/Tdap/Td (2 - Td or Tdap) 05/01/2023  OPHTHALMOLOGY EXAM  03/31/2024   Influenza Vaccine  04/09/2024   DEXA SCAN  Never done   FOOT EXAM  05/25/2024   COVID-19 Vaccine (3 - Moderna risk series) 07/30/2024 (Originally 07/14/2020)   HEMOGLOBIN A1C  08/21/2024   Diabetic kidney evaluation - Urine ACR  11/11/2024   Diabetic kidney evaluation - eGFR measurement  11/16/2024   Medicare Annual Wellness (AWV)  07/14/2025   Mammogram  06/10/2026   Cervical Cancer Screening (HPV/Pap Cotest)  06/07/2027   Pneumococcal Vaccine: 50+ Years  Completed   Hepatitis C Screening  Completed   HIV Screening  Completed   Zoster Vaccines- Shingrix   Completed   Hepatitis B Vaccines 19-59 Average Risk  Aged Out   Meningococcal B Vaccine  Aged Out        Assessment/Plan:  This is a routine wellness examination for [place].  Patient Care Team: Levora Reyes SAUNDERS, MD as PCP - General (Family Medicine) Inocencio Soyla Lunger, MD as PCP - Cardiology (Cardiology) Mavis Reyes, MD as Consulting Physician (Neurosurgery) Whitfield Raisin, NP as Nurse Practitioner (Neurology) Kassie Mallick, MD (Inactive) as Consulting Physician (Endocrinology) Rosemarie Eather RAMAN, MD as Consulting Physician (Neurology) Inocencio Soyla Lunger, MD as Consulting Physician (Cardiology)  I have personally reviewed and noted the following in the patient's chart:   Medical and social history Use of alcohol, tobacco or illicit drugs  Current medications and supplements including opioid prescriptions. Functional ability and status Nutritional status Physical activity Advanced directives List of other  physicians Hospitalizations, surgeries, and ER visits in previous 12 months Vitals Screenings to include cognitive, depression, and falls Referrals and appointments  No orders of the defined types were placed in this encounter.  In addition, I have reviewed and discussed with patient certain preventive protocols, quality metrics, and best practice recommendations. A written personalized care plan for preventive services as well as general preventive health recommendations were provided to patient.   Analiese Krupka, LPN   88/12/7972   Return in 1 year (on 07/14/2025).  After Visit Summary: (MyChart) Due to this being a telephonic visit, the after visit summary with patients personalized plan was offered to patient via MyChart   Nurse Notes:

## 2024-07-15 DIAGNOSIS — E1165 Type 2 diabetes mellitus with hyperglycemia: Secondary | ICD-10-CM | POA: Diagnosis not present

## 2024-08-04 ENCOUNTER — Other Ambulatory Visit: Payer: Self-pay | Admitting: Family Medicine

## 2024-08-04 DIAGNOSIS — F331 Major depressive disorder, recurrent, moderate: Secondary | ICD-10-CM

## 2024-08-08 ENCOUNTER — Other Ambulatory Visit: Payer: Self-pay | Admitting: Family Medicine

## 2024-08-08 DIAGNOSIS — E876 Hypokalemia: Secondary | ICD-10-CM

## 2024-08-10 NOTE — Telephone Encounter (Signed)
 Please schedule appt at the end of December early January if patient calls back

## 2024-08-10 NOTE — Telephone Encounter (Signed)
 Last office visit with me in March, 72-month follow-up recommended but I have not seen her and I do not see an appointment.  Please schedule.  Will refill medication but try to have her seen in the next 4 to 6 weeks and we will repeat labs at that time.

## 2024-08-10 NOTE — Telephone Encounter (Signed)
 Okay to refill? Questioning if she needs blood work prior to refill?

## 2024-08-10 NOTE — Telephone Encounter (Signed)
Called patient and left vm to return call.

## 2024-08-11 ENCOUNTER — Telehealth: Payer: Self-pay

## 2024-08-11 NOTE — Telephone Encounter (Signed)
 Please schedule appt at the end of December early January if patient calls back       08/10/24 12:06 PM Note Called patient and left vm to return call

## 2024-08-11 NOTE — Telephone Encounter (Signed)
 Appt has been made for 09/10/2024

## 2024-08-14 DIAGNOSIS — E1165 Type 2 diabetes mellitus with hyperglycemia: Secondary | ICD-10-CM | POA: Diagnosis not present

## 2024-08-19 ENCOUNTER — Other Ambulatory Visit: Payer: Self-pay | Admitting: Family Medicine

## 2024-08-19 DIAGNOSIS — I6930 Unspecified sequelae of cerebral infarction: Secondary | ICD-10-CM

## 2024-08-19 DIAGNOSIS — I10 Essential (primary) hypertension: Secondary | ICD-10-CM

## 2024-08-19 NOTE — Telephone Encounter (Signed)
 Copied from CRM #[id number]. Topic: Clinical - Medication Refill >> Aug 19, 2024  4:52 PM Drema MATSU wrote: Medication: irbesartan  (AVAPRO ) 150 MG tablet, clopidogrel  (PLAVIX ) 75 MG tablet  Has the patient contacted their pharmacy? Yes (Agent: If no, request that the patient contact the pharmacy for the refill. If patient does not wish to contact the pharmacy document the reason why and proceed with request.) (Agent: If yes, when and what did the pharmacy advise?)  This is the patient's preferred pharmacy:  [facility] - [place], [place] - [address] [place] [address] Phone: [phone] Fax: [phone]  Is this the correct pharmacy for this prescription? Yes If no, delete pharmacy and type the correct one.   Has the prescription been filled recently? Yes  Is the patient out of the medication? Yes  Has the patient been seen for an appointment in the last year OR does the patient have an upcoming appointment? Yes  Can we respond through MyChart? N/A  Agent: Please be advised that Rx refills may take up to 3 business days. We ask that you follow-up with your pharmacy.

## 2024-08-24 ENCOUNTER — Other Ambulatory Visit: Payer: Self-pay | Admitting: Pharmacist

## 2024-08-24 ENCOUNTER — Telehealth: Payer: Self-pay | Admitting: Pharmacist

## 2024-08-24 DIAGNOSIS — E785 Hyperlipidemia, unspecified: Secondary | ICD-10-CM

## 2024-08-24 MED ORDER — ROSUVASTATIN CALCIUM 20 MG PO TABS: 20.0000 mg | ORAL_TABLET | Freq: Every day | ORAL | 0 refills | Status: DC

## 2024-08-24 NOTE — Telephone Encounter (Signed)
 Patient is due to refill rosuvastatin  but Rx on file is out of date - written 07/2023  Med requested: rosuvastatin  20mg  Last refilled 02/28/2024 for 90 days.   Next appointment with PCP: 09/10/2024

## 2024-08-24 NOTE — Progress Notes (Signed)
 Pharmacy Quality Measure Review  This patient is appearing on a report for being at risk of failing the adherence measure for cholesterol (statin) and hypertension (ACEi/ARB) medications this calendar year.   Medication: rosuvastatin   Last fill date: 02/28/2024 for 90 day supply - no refills remaining / Rx has expired.   Medication: irbesartan  150mg  Last fill date: 05/22/2024 for 90 day supply - no refills remaining.   Patient is past due to see PCP. She was contacted recently and has appt scheduled for 09/10/2024.  She should have enough irbesartan  on hand to last until that appointment + 2 more weeks. (Filled irbesartan  on the following days in 2025 - 90 days each time 2/6, 4/20, 7/2, 9/13 = 360 days total)   Will coordinate with PCP to send in Refill for rosuvastatin .   Madelin Ray, PharmD Clinical Pharmacist [facility]  Population Health [phone]

## 2024-09-08 ENCOUNTER — Ambulatory Visit: Admitting: Internal Medicine

## 2024-09-10 ENCOUNTER — Ambulatory Visit: Admitting: Family Medicine

## 2024-09-10 ENCOUNTER — Encounter: Payer: Self-pay | Admitting: Family Medicine

## 2024-09-10 VITALS — BP 98/60 | HR 78 | Temp 98.4°F | Resp 12 | Ht 63.0 in | Wt 121.8 lb

## 2024-09-10 DIAGNOSIS — I6930 Unspecified sequelae of cerebral infarction: Secondary | ICD-10-CM | POA: Diagnosis not present

## 2024-09-10 DIAGNOSIS — R569 Unspecified convulsions: Secondary | ICD-10-CM

## 2024-09-10 DIAGNOSIS — Z23 Encounter for immunization: Secondary | ICD-10-CM

## 2024-09-10 DIAGNOSIS — I10 Essential (primary) hypertension: Secondary | ICD-10-CM | POA: Diagnosis not present

## 2024-09-10 DIAGNOSIS — I639 Cerebral infarction, unspecified: Secondary | ICD-10-CM | POA: Insufficient documentation

## 2024-09-10 DIAGNOSIS — E1159 Type 2 diabetes mellitus with other circulatory complications: Secondary | ICD-10-CM | POA: Insufficient documentation

## 2024-09-10 DIAGNOSIS — E785 Hyperlipidemia, unspecified: Secondary | ICD-10-CM | POA: Diagnosis not present

## 2024-09-10 DIAGNOSIS — F331 Major depressive disorder, recurrent, moderate: Secondary | ICD-10-CM | POA: Diagnosis not present

## 2024-09-10 DIAGNOSIS — Z8673 Personal history of transient ischemic attack (TIA), and cerebral infarction without residual deficits: Secondary | ICD-10-CM

## 2024-09-10 LAB — COMPREHENSIVE METABOLIC PANEL WITH GFR
ALT: 19 U/L (ref 3–35)
AST: 16 U/L (ref 5–37)
Albumin: 4.4 g/dL (ref 3.5–5.2)
Alkaline Phosphatase: 110 U/L (ref 39–117)
BUN: 19 mg/dL (ref 6–23)
CO2: 29 meq/L (ref 19–32)
Calcium: 9.5 mg/dL (ref 8.4–10.5)
Chloride: 101 meq/L (ref 96–112)
Creatinine, Ser: 0.55 mg/dL (ref 0.40–1.20)
GFR: 96.24 mL/min
Glucose, Bld: 99 mg/dL (ref 70–99)
Potassium: 4.9 meq/L (ref 3.5–5.1)
Sodium: 136 meq/L (ref 135–145)
Total Bilirubin: 0.3 mg/dL (ref 0.2–1.2)
Total Protein: 7.1 g/dL (ref 6.0–8.3)

## 2024-09-10 LAB — LIPID PANEL
Cholesterol: 139 mg/dL (ref 28–200)
HDL: 41.9 mg/dL
LDL Cholesterol: 85 mg/dL (ref 10–99)
NonHDL: 97.26
Total CHOL/HDL Ratio: 3
Triglycerides: 61 mg/dL (ref 10.0–149.0)
VLDL: 12.2 mg/dL (ref 0.0–40.0)

## 2024-09-10 MED ORDER — IRBESARTAN 75 MG PO TABS: 75.0000 mg | ORAL_TABLET | Freq: Every day | ORAL | 1 refills | Status: AC

## 2024-09-10 MED ORDER — ROSUVASTATIN CALCIUM 20 MG PO TABS: 20.0000 mg | ORAL_TABLET | Freq: Every day | ORAL | 1 refills | Status: AC

## 2024-09-10 MED ORDER — CLOPIDOGREL BISULFATE 75 MG PO TABS: ORAL_TABLET | ORAL | 3 refills | Status: AC

## 2024-09-10 MED ORDER — SERTRALINE HCL 100 MG PO TABS: 200.0000 mg | ORAL_TABLET | Freq: Every day | ORAL | 1 refills | Status: AC

## 2024-09-10 NOTE — Progress Notes (Signed)
 "  Subjective:  Patient ID: Brittany [name], female    DOB: 24-Aug-1961  Age: 64 y.o. MRN: [id number]  CC:  Chief Complaint  Patient presents with   Hypertension   Hyperlipidemia   Diabetes   Follow-up    No questions or concerns.     HPI Brittany [name] presents for  follow up.  Under care of optho for eye issues.  GI illness for 1 day this week - N/V/D - better now.   Hyperlipidemia, history of CVA, poststroke seizure History of CVA, ischemic left MCA stroke in 2019.  Residual expressive aphasia and cognitive impairment, complicated by poststroke seizure, treated with Keppra  500 mg twice daily without recurrent episodes.  Treated with Plavix  and Crestor .  Neurology follow-up yearly, 10/22/2023 Crestor  20 mg daily without any myalgias or side effects. No new weakness.  Lab Results  Component Value Date   CHOL 111 11/12/2023   HDL 41.80 11/12/2023   LDLCALC 48 11/12/2023   LDLDIRECT 61 12/03/2013   TRIG 105.0 11/12/2023   CHOLHDL 3 11/12/2023   Lab Results  Component Value Date   ALT 9 11/12/2023   AST 13 11/12/2023   ALKPHOS 85 11/12/2023   BILITOT 0.3 11/12/2023   Essential tremor Primidone  250 mg daily, option of propranolol discussed at her February 2025 visit with neurology if symptoms worsen.  Diabetes: Complicated by history of stroke.  Followed by endocrinology.  Dr. Sam, 02/20/2024, appt in few days. Treated with Mounjaro  5 mg weekly.  Lantus  was discontinued as well as NovoLog  in early 2025.  Microalbumin: Normal ratio 11/12/2023 Optho, foot exam, pneumovax:  Ophthalmology - appointment scheduled this month. Foot exam will be done at endocrinology, upcoming appointment. Declines lung cancer screening. Flu vaccine today.  Bone density testing - declined Colonoscopy - declines.  COVID booster - declines.  Tdap recommended at pharmacy.    Lab Results  Component Value Date   HGBA1C 6.2 (A) 02/20/2024   HGBA1C 6.1 11/12/2023   HGBA1C 9.0 (A) 05/26/2023    Lab Results  Component Value Date   MICROALBUR 1.5 11/12/2023   LDLCALC 48 11/12/2023   CREATININE 0.58 11/17/2023   Hypertension: Irbesartan  150 mg daily, history of CVA as above without recent new symptoms.  No side effects with current med regimen. Pacemaker followed by EP (Dr. Inocencio) with hx of 3rd degree AV block. Home readings: none. Denies lightheadedness/dizziness/orthostatic sx's.  Some weight loss with Mounjaro .  BP Readings from Last 3 Encounters:  09/10/24 98/60  02/20/24 118/66  11/17/23 124/82   Wt Readings from Last 3 Encounters:  09/10/24 121 lb 12.8 oz (55.2 kg)  07/14/24 129 lb (58.5 kg)  02/20/24 129 lb (58.5 kg)    Lab Results  Component Value Date   CREATININE 0.58 11/17/2023    Depression: Sertraline  200 mg daily with lorazepam  as needed, family stressors.  Had not required alprazolam recently when discussed in March of last year.Controlled substance database reviewed, no listings. Sertraline  200mg  daily working well. BDZ twice only during stress of holidays.     07/14/2024   11:00 AM 05/14/2023    1:55 PM 11/11/2022    1:49 PM 06/06/2022   11:12 AM 04/08/2022    1:28 PM  Depression screen PHQ 2/9  Decreased Interest 0 0 0 0 0  Down, Depressed, Hopeless 0 0 0 1 0  PHQ - 2 Score 0 0 0 1 0  Altered sleeping 0  0 1 2  Tired, decreased energy 0  0 0  1  Change in appetite 0  0 0 1  Feeling bad or failure about yourself  0  0 1 0  Trouble concentrating 0  0 0 0  Moving slowly or fidgety/restless 0  1 0 0  Suicidal thoughts 0  0 0 0  PHQ-9 Score 0   1  3  4    Difficult doing work/chores Not difficult at all         Data saved with a previous flowsheet row definition          History Patient Active Problem List   Diagnosis Date Noted   Moderate episode of recurrent major depressive disorder (HCC) 09/10/2024   Seizure with onset of stroke (HCC) 09/10/2024   Type 2 diabetes mellitus with other circulatory complication, with long-term current  use of insulin  (HCC) 09/10/2024   Long-term (current) use of injectable non-insulin  antidiabetic drugs 05/26/2023   Type 2 diabetes mellitus with hyperglycemia, with long-term current use of insulin  (HCC) 04/02/2022   Diabetes mellitus (HCC) 04/02/2022   Bilateral high frequency sensorineural hearing loss 02/25/2019   Non compliance with medical treatment 03/23/2018   History of tobacco use disorder 01/01/2018   Syncope and collapse 01/01/2018   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 12/12/2017   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 cerebrovascular accident (CVA) (HCC) 12/12/2017   History of arterial ischemic stroke 12/12/2017   History of completed stroke 12/12/2017   Leukocytosis 12/12/2017   Hypokalemia 12/12/2017   Hypotension    Middle cerebral artery embolism, left 12/06/2017   Poor compliance with CPAP treatment 03/04/2017   MCI (mild cognitive impairment) 11/27/2016   Left sided lacunar infarction (HCC) 03/11/2014   Cerebrovascular small vessel disease 03/11/2014   Insomnia, idiopathic 12/20/2013   Snoring 12/06/2013   Essential hypertension, benign 10/26/2012   Hyperlipidemia LDL goal <70 10/26/2012   Polypharmacy 10/26/2012   DM (diabetes mellitus) (HCC) 10/13/2011   Tremor 10/13/2011   GERD (gastroesophageal reflux disease) 10/13/2011   Depression 10/13/2011   Past Medical History:  Diagnosis Date   Asthma    Cataract    Phreesia 10/03/2020   Chronic lower back pain    Depression    Depression    [employer]reesia 10/03/2020   Diabetes mellitus without complication (HCC)    [employer]reesia 10/03/2020   Family history of adverse reaction to anesthesia    Mother has trouble coming out of anesthesia; patient reports she has no problems   Fibromyalgia    High cholesterol    Hypertension    [employer]reesia 10/03/2020   Presence of permanent cardiac pacemaker    Seizures (HCC) 11/2017   day after she came home from hospital after having  stroke (01/01/2018)   Stroke (HCC) 12/06/2017   speech issues since (01/01/2018)   Type II diabetes mellitus [facility])    Past Surgical History:  Procedure Laterality Date   CHOLECYSTECTOMY     EYE SURGERY N/A    [employer]reesia 10/03/2020   GAS/FLUID EXCHANGE Right 12/12/2019   Procedure: Gas/Fluid Exchange;  Surgeon: Tobie Baptist, MD;  Location: [facility] OR;  Service: Ophthalmology;  Laterality: Right;   INSERT / REPLACE / REMOVE PACEMAKER  01/01/2018   IR CT HEAD LTD  12/06/2017   IR PERCUTANEOUS ART THROMBECTOMY/INFUSION INTRACRANIAL INC DIAG ANGIO  12/06/2017   LOOP RECORDER INSERTION N/A 12/12/2017   Procedure: LOOP RECORDER INSERTION;  Surgeon: Inocencio Soyla Lunger, MD;  Location: [facility] INVASIVE CV LAB;  Service: Cardiovascular;  Laterality: N/A;  LOOP RECORDER REMOVAL  01/01/2018   LOOP RECORDER REMOVAL N/A 01/01/2018   Procedure: LOOP RECORDER REMOVAL;  Surgeon: Inocencio Soyla Lunger, MD;  Location: [facility] INVASIVE CV LAB;  Service: Cardiovascular;  Laterality: N/A;   NECK SURGERY     PACEMAKER IMPLANT N/A 01/01/2018   Procedure: PACEMAKER IMPLANT;  Surgeon: Inocencio Soyla Lunger, MD;  Location: [facility] INVASIVE CV LAB;  Service: Cardiovascular;  Laterality: N/A;   PHOTOCOAGULATION Right 12/12/2019   Procedure: Photocoagulation;  Surgeon: Tobie Baptist, MD;  Location: [facility] OR;  Service: Ophthalmology;  Laterality: Right;   RADIOLOGY WITH ANESTHESIA N/A 12/06/2017   Procedure: CODE STROKE;  Surgeon: Dolphus Carrion, MD;  Location: [facility] OR;  Service: Radiology;  Laterality: N/A;   SCLERAL BUCKLE WITH POSSIBLE 25 GAUGE PARS PLANA VITRECTOMY Right 12/12/2019   Procedure: SCLERAL BUCKLE WITH  PARS PLANA VITRECTOMY RETINAL DETACHMENT REPAIR;  Surgeon: Tobie Baptist, MD;  Location: [facility] OR;  Service: Ophthalmology;  Laterality: Right;   TEE WITHOUT CARDIOVERSION N/A 12/10/2017   Procedure: TRANSESOPHAGEAL ECHOCARDIOGRAM (TEE);  Surgeon: Jeffrie Oneil BROCKS, MD;  Location: [facility] ENDOSCOPY;  Service: Cardiovascular;  Laterality: N/A;    THROMBECTOMY FEMORAL ARTERY Right 12/07/2017   Procedure: THROMBECTOMY RIGHT FEMORAL ARTERY;  Surgeon: Sheree Penne Bruckner, MD;  Location: [facility] OR;  Service: Vascular;  Laterality: Right;   TONSILLECTOMY     Allergies[1] Prior to Admission medications  Medication Sig Start Date End Date Taking? Authorizing Provider  ACCU-CHEK GUIDE test strip USE AS DIRECTED FOUR TIMES DAILY 09/18/21  Yes Kassie Mallick, MD  [facility] P 0.1 % SOLN Apply to eye. 04/15/23  Yes [provider]  ALPHAGAN P 0.1 % SOLN Apply to eye. 12/18/22  Yes [provider]  B Complex-Biotin-FA (MULTI-B COMPLEX) CAPS Take one a day ,B complex  multivitamin over the counter or use prenatal vitamin. 08/28/16  Yes Dohmeier, Dedra, MD  Blood Glucose Monitoring Suppl (ACCU-CHEK GUIDE ME) w/Device KIT 1 each by Does not apply route 4 (four) times daily. E11.9 05/22/20  Yes Kassie Mallick, MD  clopidogrel  (PLAVIX ) 75 MG tablet TAKE 1 TABLET(75 MG) BY MOUTH DAILY 10/03/23  Yes Levora Reyes SAUNDERS, MD  Continuous Glucose Sensor (DEXCOM G7 SENSOR) MISC Change sensor every 10 days. 04/07/24  Yes Levora Reyes SAUNDERS, MD  Lancets [facility] DELICA PLUS [place]) MISC USE FOUR TIMES DAILY 07/10/21  Yes Kassie Mallick, MD  levETIRAcetam  (KEPPRA ) 500 MG tablet Take 1 tablet (500 mg total) by mouth 2 (two) times daily. 10/22/23  Yes McCue, Harlene, NP  LORazepam  (ATIVAN ) 0.5 MG tablet Take 0.5-1 tablets (0.25-0.5 mg total) by mouth 2 (two) times daily as needed for anxiety (anxiety and tremors (social situations)). 10/16/20  Yes McCue, Harlene, NP  potassium chloride  SA (KLOR-CON  M) 20 MEQ tablet Take 1 tablet (20 mEq total) by mouth daily. 08/10/24  Yes Levora Reyes SAUNDERS, MD  primidone  (MYSOLINE ) 250 MG tablet Take 1 tablet (250 mg total) by mouth daily. 10/22/23  Yes McCue, Harlene, NP  rosuvastatin  (CRESTOR ) 20 MG tablet Take 1 tablet (20 mg total) by mouth daily. 08/24/24  Yes Levora Reyes SAUNDERS, MD  sertraline  (ZOLOFT ) 100 MG tablet TAKE 2  TABLETS AT BEDTIME 08/04/24  Yes Levora Reyes SAUNDERS, MD  timolol (TIMOPTIC) 0.5 % ophthalmic solution SMARTSIG:In Eye(s) 01/01/23  Yes [provider]  tirzepatide  (MOUNJARO ) 5 MG/0.5ML Pen Inject 5 mg into the skin once a week. 02/20/24  Yes Shamleffer, Ibtehal Jaralla, MD  Insulin  Pen Needle (BD PEN NEEDLE NANO 2ND GEN) 32G X 4 MM MISC  USE FOUR TIMES DAILY AS DIRECTED 05/26/23   Shamleffer, Ibtehal Jaralla, MD  irbesartan  (AVAPRO ) 150 MG tablet TAKE 1 TABLET(150 MG) BY MOUTH DAILY 10/03/23   Levora Reyes SAUNDERS, MD  ofloxacin  (FLOXIN ) 0.3 % OTIC solution Place 5 drops into the right ear 2 (two) times daily. 09/12/23   Schutt, Marsa HERO, PA-C   Social History   Socioeconomic History   Marital status: Married    Spouse name: [name]   Number of children: 2   Years of education: 14   Highest education level: Associate degree: occupational, scientist, product/process development, or vocational program  Occupational History   Not on file  Tobacco Use   Smoking status: Former    Current packs/day: 1.00    Average packs/day: 1 pack/day for 38.0 years (38.0 ttl pk-yrs)    Types: Cigarettes   Smokeless tobacco: Never   Tobacco comments:    Pt used chantix  July till January, quit first of the year   Vaping Use   Vaping status: Never Used  Substance and Sexual Activity   Alcohol use: Yes    Comment: 01/01/2018 nothing since stroke 11/2017   Drug use: No   Sexual activity: Not Currently  Other Topics Concern   Not on file  Social History Narrative   Patient is married ([name]) and lives at home with her family   Patient has two children.   Patient works at [employer].   Patient has a college education.   Patient is right-handed.   Patient drinks 3 cups of coffee M-F.      Social Drivers of Health   Tobacco Use: Medium Risk (09/10/2024)   Patient History    Smoking Tobacco Use: Former    Smokeless Tobacco Use: Never    Passive Exposure: Not on file  Financial Resource Strain: Low Risk (07/07/2024)    Overall Financial Resource Strain (CARDIA)    Difficulty of Paying Living Expenses: Not hard at all  Food Insecurity: No Food Insecurity (07/14/2024)   Epic    Worried About Programme Researcher, Broadcasting/film/video in the Last Year: Never true    Ran Out of Food in the Last Year: Never true  Transportation Needs: No Transportation Needs (07/14/2024)   Epic    Lack of Transportation (Medical): No    Lack of Transportation (Non-Medical): No  Physical Activity: Unknown (07/14/2024)   Exercise Vital Sign    Days of Exercise per Week: 7 days    Minutes of Exercise per Session: Patient declined  Stress: No Stress Concern Present (07/14/2024)   [employer] of Occupational Health - Occupational Stress Questionnaire    Feeling of Stress: Not at all  Social Connections: Unknown (07/14/2024)   Social Connection and Isolation Panel    Frequency of Communication with Friends and Family: Patient declined    Frequency of Social Gatherings with Friends and Family: Patient declined    Attends Religious Services: Never    Database Administrator or Organizations: No    Attends Engineer, Structural: Not on file    Marital Status: Married  Catering Manager Violence: Not At Risk (07/14/2024)   Epic    Fear of Current or Ex-Partner: No    Emotionally Abused: No    Physically Abused: No    Sexually Abused: No  Depression (PHQ2-9): Low Risk (07/14/2024)   Depression (PHQ2-9)    PHQ-2 Score: 0  Alcohol Screen: Not on file  Housing: Unknown (07/14/2024)   Epic    Unable to Pay for  Housing in the Last Year: No    Number of Times Moved in the Last Year: Not on file    Homeless in the Last Year: No  Utilities: Not At Risk (07/14/2024)   Epic    Threatened with loss of utilities: No  Health Literacy: Adequate Health Literacy (07/14/2024)   B1300 Health Literacy    Frequency of need for help with medical instructions: Never    Review of Systems  Constitutional:  Negative for fatigue and unexpected weight change.   Respiratory:  Negative for chest tightness and shortness of breath.   Cardiovascular:  Negative for chest pain, palpitations and leg swelling.  Gastrointestinal:  Negative for abdominal pain and blood in stool.  Neurological:  Negative for dizziness, syncope, light-headedness and headaches.     Objective:   Vitals:   09/10/24 1009  BP: 98/60  Pulse: 78  Resp: 12  Temp: 98.4 F (36.9 C)  TempSrc: Temporal  SpO2: 97%  Weight: 121 lb 12.8 oz (55.2 kg)  Height: 5' 3 (1.6 m)     Physical Exam Vitals reviewed.  Constitutional:      Appearance: Normal appearance. She is well-developed.  HENT:     Head: Normocephalic and atraumatic.  Eyes:     Conjunctiva/sclera: Conjunctivae normal.     Pupils: Pupils are equal, round, and reactive to light.  Neck:     Vascular: No carotid bruit.  Cardiovascular:     Rate and Rhythm: Normal rate and regular rhythm.     Heart sounds: Normal heart sounds.  Pulmonary:     Effort: Pulmonary effort is normal.     Breath sounds: Normal breath sounds.  Abdominal:     Palpations: Abdomen is soft. There is no pulsatile mass.     Tenderness: There is no abdominal tenderness.  Musculoskeletal:     Right lower leg: No edema.     Left lower leg: No edema.  Skin:    General: Skin is warm and dry.  Neurological:     Mental Status: She is alert and oriented to person, place, and time.  Psychiatric:        Mood and Affect: Mood normal.        Behavior: Behavior normal.     Assessment & Plan:  Brittany [name] is a 64 y.o. female . Moderate episode of recurrent major depressive disorder (HCC)  - Stable with current med regimen.  Continue sertraline  same dose.  Seizure with onset of stroke (HCC)  - Followed by neurology, no seizure since initial post CVA seizure, compliant with Keppra .  Continue follow-up with neuro.  Type 2 diabetes mellitus with other circulatory complication, with long-term current use of insulin  (HCC)  - Followed by  endocrinology with upcoming visit.  No changes, will defer A1c testing to endocrinology.  History of arterial ischemic stroke  - With residual symptoms as above, seen by neuro routinely, tolerating Plavix  and statin for secondary prevention.  Hyperlipidemia LDL goal <70 - Plan: Comprehensive metabolic panel with GFR, Lipid panel  - Tolerating statin, check labs and adjust plan accordingly.  Essential hypertension, benign - Plan: irbesartan  (AVAPRO ) 75 MG tablet  - Blood pressure on the lower side in office.  With weight loss on Mounjaro  I think she can probably tolerate a lower dose of Avapro , changed to 75 mg daily.  Monitor closely at her upcoming follow-ups with her other specialists and if blood pressure starts to go back up can return to the higher dose.  Continue  potassium, check labs and adjust regimen accordingly  Needs flu shot - Plan: Flu vaccine HIGH DOSE PF(Fluzone Trivalent)   Meds ordered this encounter  Medications   irbesartan  (AVAPRO ) 75 MG tablet    Sig: Take 1 tablet (75 mg total) by mouth daily.    Dispense:  90 tablet    Refill:  1   Patient Instructions  Blood pressure is running slightly lower and may be due to the weight loss. I will decrease the dose of irbesartan , follow up with other specialists as planned in the next month and if they measure elevated readings, we can return to prior dose. Thank you for coming in today. No other change in medications at this time. If there are any concerns on your bloodwork, I will let you know. Take care!       Signed,   Reyes Pines, MD [facility] 09/10/2024 10:57 AM      [1] No Known Allergies  "

## 2024-09-10 NOTE — Patient Instructions (Addendum)
 Blood pressure is running slightly lower and may be due to the weight loss. I will decrease the dose of irbesartan , follow up with other specialists as planned in the next month and if they measure elevated readings, we can return to prior dose. Thank you for coming in today. No other change in medications at this time. If there are any concerns on your bloodwork, I will let you know. Take care!

## 2024-09-11 ENCOUNTER — Ambulatory Visit: Payer: Self-pay | Admitting: Family Medicine

## 2024-09-13 ENCOUNTER — Ambulatory Visit: Admitting: Internal Medicine

## 2024-09-13 ENCOUNTER — Encounter: Payer: Self-pay | Admitting: Internal Medicine

## 2024-09-13 VITALS — BP 112/62 | Ht 63.0 in | Wt 123.0 lb

## 2024-09-13 DIAGNOSIS — Z794 Long term (current) use of insulin: Secondary | ICD-10-CM | POA: Diagnosis not present

## 2024-09-13 DIAGNOSIS — E1149 Type 2 diabetes mellitus with other diabetic neurological complication: Secondary | ICD-10-CM | POA: Diagnosis not present

## 2024-09-13 DIAGNOSIS — E1159 Type 2 diabetes mellitus with other circulatory complications: Secondary | ICD-10-CM | POA: Diagnosis not present

## 2024-09-13 LAB — POCT GLYCOSYLATED HEMOGLOBIN (HGB A1C): Hemoglobin A1C: 5.8 % — AB (ref 4.0–5.6)

## 2024-09-13 MED ORDER — TIRZEPATIDE 5 MG/0.5ML ~~LOC~~ SOAJ: 5.0000 mg | SUBCUTANEOUS | 3 refills | Status: AC

## 2024-09-13 NOTE — Patient Instructions (Signed)
 Continue Mounjaro  5 mg once weekly     HOW TO TREAT LOW BLOOD SUGARS (Blood sugar LESS THAN 70 MG/DL) Please follow the RULE OF 15 for the treatment of hypoglycemia treatment (when your (blood sugars are less than 70 mg/dL)   STEP 1: Take 15 grams of carbohydrates when your blood sugar is low, which includes:  3-4 GLUCOSE TABS  OR 3-4 OZ OF JUICE OR REGULAR SODA OR ONE TUBE OF GLUCOSE GEL    STEP 2: RECHECK blood sugar in 15 MINUTES STEP 3: If your blood sugar is still low at the 15 minute recheck --> then, go back to STEP 1 and treat AGAIN with another 15 grams of carbohydrates.

## 2024-09-13 NOTE — Progress Notes (Signed)
 "   Name: Brittany [name]  Age/ Sex: 64 y.o., female   MRN/ DOB: [id number], [birth year]     PCP: Levora Reyes SAUNDERS, MD   Reason for Endocrinology Evaluation: Type 2 Diabetes Mellitus  Initial Endocrine Consultative Visit: 05/04/2018    PATIENT IDENTIFIER: Brittany [name] is a 64 y.o. female with a past medical history of T2DM, HTN, fibromyalgia and dyslipidemia. The patient has followed with Endocrinology clinic since 05/04/2018 for consultative assistance with management of her diabetes.  DIABETIC HISTORY:  Ms. Haub was diagnosed with DM 1999, and started insulin  therapy in 2019. Her hemoglobin A1c has ranged from 6.6% in 2013, peaking at >14.0% in 2018.    Was last seen by Dr. Kassie in March 2023  Switched Trulicity  to Mounjaro   05/2023  Novolog  discontinued  10/2023 due to hypoglycemia  Lantus  discontinued 11/2023 with an A1c 6.1%   SUBJECTIVE:   During the last visit (02/20/2024): A1c 6.2%   Today (09/13/2024): [name] is here for a follow up on diabetes management.  She checks her blood sugars multiple  times daily, through CGM. The patient has had hypoglycemic episodes since the last clinic visit.    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  She continues to follow-up with ophthalmology for macular degeneration of the left eye  She has noted with weight loss but she doesn't;t want to increase weight again at this time  No nausea  No constipation but had gastritis last week but this has resolved    HOME DIABETES REGIMEN:  Mounjaro  5 mg weekly    Statin: yes ACE-I/ARB: Yes    CONTINUOUS GLUCOSE MONITORING RECORD INTERPRETATION :   Sensor description: Dexcom G7  Dates of Recording: 12/23-09/13/2024  Results statistics:   CGM use % of time 95  Average and SD 106/26  Time in range 95%  % Time Above 180 2  % Time above 250 0  % Time Below target 2   Glycemic patterns summary: BGs are optimal throughout the day and  night  Hyperglycemic episodes N/A  Hypoglycemic episodes occurred all night  Overnight periods: Trends down   DIABETIC COMPLICATIONS: Microvascular complications:   Denies: CKD Last Eye Exam: Completed 05/31/2024  Macrovascular complications:  CVA Denies: CAD, PVD   HISTORY:  Past Medical History:  Past Medical History:  Diagnosis Date   Asthma    Cataract    Phreesia 10/03/2020   Chronic lower back pain    Depression    Depression    Phreesia 10/03/2020   Diabetes mellitus without complication (HCC)    Phreesia 10/03/2020   Family history of adverse reaction to anesthesia    Mother has trouble coming out of anesthesia; patient reports she has no problems   Fibromyalgia    High cholesterol    Hypertension    Phreesia 10/03/2020   Presence of permanent cardiac pacemaker    Seizures (HCC) 11/2017   day after she came home from hospital after having stroke (01/01/2018)   Stroke (HCC) 12/06/2017   speech issues since (01/01/2018)   Type II diabetes mellitus (HCC)    Past Surgical History:  Past Surgical History:  Procedure Laterality Date   CHOLECYSTECTOMY     EYE SURGERY N/A    Phreesia 10/03/2020   GAS/FLUID EXCHANGE Right 12/12/2019   Procedure: Gas/Fluid Exchange;  Surgeon: Tobie Baptist, MD;  Location: [facility] OR;  Service: Ophthalmology;  Laterality: Right;   INSERT / REPLACE / REMOVE PACEMAKER  01/01/2018  IR CT HEAD LTD  12/06/2017   IR PERCUTANEOUS ART THROMBECTOMY/INFUSION INTRACRANIAL INC DIAG ANGIO  12/06/2017   LOOP RECORDER INSERTION N/A 12/12/2017   Procedure: LOOP RECORDER INSERTION;  Surgeon: Inocencio Soyla Lunger, MD;  Location: [facility] INVASIVE CV LAB;  Service: Cardiovascular;  Laterality: N/A;   LOOP RECORDER REMOVAL  01/01/2018   LOOP RECORDER REMOVAL N/A 01/01/2018   Procedure: LOOP RECORDER REMOVAL;  Surgeon: Inocencio Soyla Lunger, MD;  Location: [facility];  Service: Cardiovascular;  Laterality: N/A;   NECK SURGERY     PACEMAKER IMPLANT N/A  01/01/2018   Procedure: PACEMAKER IMPLANT;  Surgeon: Inocencio Soyla Lunger, MD;  Location: [facility];  Service: Cardiovascular;  Laterality: N/A;   PHOTOCOAGULATION Right 12/12/2019   Procedure: Photocoagulation;  Surgeon: Tobie Baptist, MD;  Location: [facility] OR;  Service: Ophthalmology;  Laterality: Right;   RADIOLOGY WITH ANESTHESIA N/A 12/06/2017   Procedure: CODE STROKE;  Surgeon: Dolphus Carrion, MD;  Location: [facility] OR;  Service: Radiology;  Laterality: N/A;   SCLERAL BUCKLE WITH POSSIBLE 25 GAUGE PARS PLANA VITRECTOMY Right 12/12/2019   Procedure: SCLERAL BUCKLE WITH  PARS PLANA VITRECTOMY RETINAL DETACHMENT REPAIR;  Surgeon: Tobie Baptist, MD;  Location: [facility] OR;  Service: Ophthalmology;  Laterality: Right;   TEE WITHOUT CARDIOVERSION N/A 12/10/2017   Procedure: TRANSESOPHAGEAL ECHOCARDIOGRAM (TEE);  Surgeon: Jeffrie Oneil BROCKS, MD;  Location: [facility] ENDOSCOPY;  Service: Cardiovascular;  Laterality: N/A;   THROMBECTOMY FEMORAL ARTERY Right 12/07/2017   Procedure: THROMBECTOMY RIGHT FEMORAL ARTERY;  Surgeon: Sheree Penne Bruckner, MD;  Location: [facility] OR;  Service: Vascular;  Laterality: Right;   TONSILLECTOMY     Social History:  reports that she has quit smoking. Her smoking use included cigarettes. She has a 38 pack-year smoking history. She has never used smokeless tobacco. She reports current alcohol use. She reports that she does not use drugs. Family History:  Family History  Problem Relation Age of Onset   Cancer Mother    Diabetes Mother    Heart disease Mother    Parkinsonism Mother    Diabetes Father    Heart disease Father    Dementia Father    COPD Maternal Aunt    Cancer Maternal Grandmother 69       lung   Breast cancer Neg Hx    Stroke Neg Hx      HOME MEDICATIONS: Allergies as of 09/13/2024   No Known Allergies      Medication List        Accurate as of September 13, 2024  9:06 AM. If you have any questions, ask your nurse or doctor.          Accu-Chek Guide  Me w/Device Kit 1 each by Does not apply route 4 (four) times daily. E11.9   Accu-Chek Guide test strip Generic drug: glucose blood USE AS DIRECTED FOUR TIMES DAILY   Alphagan P 0.1 % Soln Generic drug: brimonidine Apply to eye.   Alphagan P 0.1 % Soln Generic drug: brimonidine Apply to eye.   clopidogrel  75 MG tablet Commonly known as: PLAVIX  TAKE 1 TABLET(75 MG) BY MOUTH DAILY   Dexcom G7 Sensor Misc Change sensor every 10 days.   irbesartan  75 MG tablet Commonly known as: Avapro  Take 1 tablet (75 mg total) by mouth daily.   levETIRAcetam  500 MG tablet Commonly known as: KEPPRA  Take 1 tablet (500 mg total) by mouth 2 (two) times daily.   LORazepam  0.5 MG tablet Commonly known as: Ativan  Take 0.5-1 tablets (  0.25-0.5 mg total) by mouth 2 (two) times daily as needed for anxiety (anxiety and tremors (social situations)).   Multi-B Complex Caps Take one a day ,B complex  multivitamin over the counter or use prenatal vitamin.   OneTouch Delica Plus Lancet33G Misc USE FOUR TIMES DAILY   potassium chloride  SA 20 MEQ tablet Commonly known as: KLOR-CON  M Take 1 tablet (20 mEq total) by mouth daily.   primidone  250 MG tablet Commonly known as: MYSOLINE  Take 1 tablet (250 mg total) by mouth daily.   rosuvastatin  20 MG tablet Commonly known as: CRESTOR  Take 1 tablet (20 mg total) by mouth daily.   sertraline  100 MG tablet Commonly known as: ZOLOFT  Take 2 tablets (200 mg total) by mouth at bedtime.   timolol 0.5 % ophthalmic solution Commonly known as: TIMOPTIC SMARTSIG:In Eye(s)   tirzepatide  5 MG/0.5ML Pen Commonly known as: MOUNJARO  Inject 5 mg into the skin once a week.         OBJECTIVE:   Vital Signs: BP 112/62   Ht 5' 3 (1.6 m)   Wt 123 lb (55.8 kg)   BMI 21.79 kg/m   Wt Readings from Last 3 Encounters:  09/13/24 123 lb (55.8 kg)  09/10/24 121 lb 12.8 oz (55.2 kg)  07/14/24 129 lb (58.5 kg)     Exam: General: Pt appears well and is in  NAD  Lungs: Clear with good BS bilat   Heart: RRR   Neuro: MS is good with appropriate affect, pt is alert and Ox3    DM foot exam: 09/13/2024  The skin of the feet is intact without sores or ulcerations. The pedal pulses are 2+ on right and 2+ on left. The sensation is intact to a screening 5.07, 10 gram monofilament bilaterally        DATA REVIEWED:  Lab Results  Component Value Date   HGBA1C 5.8 (A) 09/13/2024   HGBA1C 6.2 (A) 02/20/2024   HGBA1C 6.1 11/12/2023    Latest Reference Range & Units 09/10/24 10:58  Sodium 135 - 145 mEq/L 136  Potassium 3.5 - 5.1 mEq/L 4.9  Chloride 96 - 112 mEq/L 101  CO2 19 - 32 mEq/L 29  Glucose 70 - 99 mg/dL 99  BUN 6 - 23 mg/dL 19  Creatinine 9.59 - 8.79 mg/dL 9.44  Calcium  8.4 - 10.5 mg/dL 9.5  Alkaline Phosphatase 39 - 117 U/L 110  Albumin 3.5 - 5.2 g/dL 4.4  AST 5 - 37 U/L 16  ALT 3 - 35 U/L 19  Total Protein 6.0 - 8.3 g/dL 7.1  Total Bilirubin 0.2 - 1.2 mg/dL 0.3  GFR >39.99 mL/min 96.24    Latest Reference Range & Units 09/10/24 10:58  Total CHOL/HDL Ratio  3  Cholesterol 28 - 200 mg/dL 860  HDL Cholesterol >60.99 mg/dL 58.09  LDL (calc) 10 - 99 mg/dL 85  NonHDL  02.73  Triglycerides 89.9 - 149.0 mg/dL 38.9  VLDL 0.0 - 59.9 mg/dL 87.7    Old records , labs and images have been reviewed.     ASSESSMENT / PLAN / RECOMMENDATIONS:   1) Type 2 Diabetes Mellitus, Optimally  controlled, With Macrovascular complications - Most recent A1c of 5.8%. Goal A1c < 7.0 %.     -A1c remains optimal -Patient has been noted with an average of 1 LB weight loss 5 months, patient would like to remain on current dose of Mounjaro  as she does not wish to regain any weight at this time -She has self discontinued  NovoLog  10/2023 due to hypoglycemia -Lantus  was discontinued 11/2023  MEDICATIONS: Continue Mounjaro  5 mg weekly  EDUCATION / INSTRUCTIONS: BG monitoring instructions: Patient is instructed to check her blood sugars 3 times a day,  before each meal. Call [place] [facility] if: BG persistently < 70  I reviewed the Rule of 15 for the treatment of hypoglycemia in detail with the patient. Literature supplied.SABRA   2) Diabetic complications:  Eye: Does not have known diabetic retinopathy.  Neuro/ Feet: Does not have known diabetic peripheral neuropathy .  Renal: Patient does not have known baseline CKD. She   is  on an ACEI/ARB at present.    F/U in 6 months   Signed electronically by: Stefano Redgie Butts, MD  [facility] Endocr[facility]ology  [facility] [address] [place] [place], [place] [address] Phone: [phone] FAX: [phone]   CC: Levora Reyes SAUNDERS, MD [id number] [place] [address] Phone: [phone]  Fax: [phone]  Return to Endocrinology clinic as below: Future Appointments  Date Time Provider Department Center  09/13/2024  9:30 AM Harrietta Incorvaia, Donell Redgie, MD [facility] None  09/28/2024  7:10 AM [facility] DEVICE REMOTES [facility]  10/27/2024  3:15 PM Whitfield Raisin, NP [facility] None  12/28/2024  7:10 AM [facility] DEVICE REMOTES [facility]&V  03/21/2025  9:40 AM Levora Reyes SAUNDERS, MD [facility] [place]  03/29/2025  7:10 AM [facility] DEVICE REMOTES [facility] H[facility]V  06/28/2025  7:10 AM [facility] DEVICE REMOTES [facility]  09/27/2025  7:10 AM [facility]VT DEVICE REMOTES [facility]     "

## 2024-09-28 ENCOUNTER — Ambulatory Visit

## 2024-09-28 DIAGNOSIS — I442 Atrioventricular block, complete: Secondary | ICD-10-CM

## 2024-09-29 ENCOUNTER — Other Ambulatory Visit: Payer: Self-pay | Admitting: Adult Health

## 2024-09-30 ENCOUNTER — Ambulatory Visit: Payer: Self-pay | Admitting: Cardiology

## 2024-09-30 LAB — CUP PACEART REMOTE DEVICE CHECK
Battery Remaining Longevity: 107 mo
Battery Voltage: 3 V
Brady Statistic AP VP Percent: 0.23 %
Brady Statistic AP VS Percent: 0.18 %
Brady Statistic AS VP Percent: 13.59 %
Brady Statistic AS VS Percent: 86 %
Brady Statistic RA Percent Paced: 0.42 %
Brady Statistic RV Percent Paced: 13.82 %
Date Time Interrogation Session: 20260119234853
Implantable Lead Connection Status: 753985
Implantable Lead Connection Status: 753985
Implantable Lead Implant Date: 20190425
Implantable Lead Implant Date: 20190425
Implantable Lead Location: 753859
Implantable Lead Location: 753860
Implantable Lead Model: 5076
Implantable Lead Model: 5076
Implantable Pulse Generator Implant Date: 20190425
Lead Channel Impedance Value: 380 Ohm
Lead Channel Impedance Value: 380 Ohm
Lead Channel Impedance Value: 418 Ohm
Lead Channel Impedance Value: 494 Ohm
Lead Channel Pacing Threshold Amplitude: 0.5 V
Lead Channel Pacing Threshold Amplitude: 0.875 V
Lead Channel Pacing Threshold Pulse Width: 0.4 ms
Lead Channel Pacing Threshold Pulse Width: 0.4 ms
Lead Channel Sensing Intrinsic Amplitude: 11.125 mV
Lead Channel Sensing Intrinsic Amplitude: 11.125 mV
Lead Channel Sensing Intrinsic Amplitude: 3.375 mV
Lead Channel Sensing Intrinsic Amplitude: 3.375 mV
Lead Channel Setting Pacing Amplitude: 1.5 V
Lead Channel Setting Pacing Amplitude: 2.5 V
Lead Channel Setting Pacing Pulse Width: 0.4 ms
Lead Channel Setting Sensing Sensitivity: 2 mV
Zone Setting Status: 755011
Zone Setting Status: 755011

## 2024-10-01 NOTE — Progress Notes (Signed)
 Remote PPM Transmission

## 2024-10-27 ENCOUNTER — Ambulatory Visit: Payer: Medicare PPO | Admitting: Adult Health

## 2024-12-28 ENCOUNTER — Ambulatory Visit

## 2025-03-18 ENCOUNTER — Ambulatory Visit: Admitting: Internal Medicine

## 2025-03-21 ENCOUNTER — Encounter: Admitting: Family Medicine

## 2025-03-29 ENCOUNTER — Ambulatory Visit

## 2025-06-28 ENCOUNTER — Ambulatory Visit

## 2025-09-27 ENCOUNTER — Ambulatory Visit
